# Patient Record
Sex: Male | Born: 1955 | Race: White | Hispanic: No | Marital: Married | Smoking: Former smoker
Health system: Southern US, Community
[De-identification: ages and names within clinical notes are randomized; demographics above are authoritative.]

## PROBLEM LIST (undated history)

## (undated) DIAGNOSIS — R911 Solitary pulmonary nodule: Secondary | ICD-10-CM

## (undated) DIAGNOSIS — K219 Gastro-esophageal reflux disease without esophagitis: Secondary | ICD-10-CM

## (undated) DIAGNOSIS — E119 Type 2 diabetes mellitus without complications: Secondary | ICD-10-CM

## (undated) DIAGNOSIS — F411 Generalized anxiety disorder: Secondary | ICD-10-CM

## (undated) DIAGNOSIS — F329 Major depressive disorder, single episode, unspecified: Secondary | ICD-10-CM

## (undated) DIAGNOSIS — F17200 Nicotine dependence, unspecified, uncomplicated: Secondary | ICD-10-CM

## (undated) DIAGNOSIS — I714 Abdominal aortic aneurysm, without rupture, unspecified: Secondary | ICD-10-CM

## (undated) DIAGNOSIS — M545 Low back pain, unspecified: Secondary | ICD-10-CM

## (undated) DIAGNOSIS — H6120 Impacted cerumen, unspecified ear: Secondary | ICD-10-CM

## (undated) DIAGNOSIS — E785 Hyperlipidemia, unspecified: Secondary | ICD-10-CM

## (undated) DIAGNOSIS — Z8639 Personal history of other endocrine, nutritional and metabolic disease: Secondary | ICD-10-CM

## (undated) DIAGNOSIS — J019 Acute sinusitis, unspecified: Secondary | ICD-10-CM

## (undated) DIAGNOSIS — R05 Cough: Secondary | ICD-10-CM

## (undated) DIAGNOSIS — I459 Conduction disorder, unspecified: Secondary | ICD-10-CM

## (undated) DIAGNOSIS — R059 Cough, unspecified: Secondary | ICD-10-CM

## (undated) DIAGNOSIS — R252 Cramp and spasm: Secondary | ICD-10-CM

## (undated) DIAGNOSIS — N529 Male erectile dysfunction, unspecified: Secondary | ICD-10-CM

## (undated) DIAGNOSIS — J449 Chronic obstructive pulmonary disease, unspecified: Secondary | ICD-10-CM

## (undated) DIAGNOSIS — I10 Essential (primary) hypertension: Secondary | ICD-10-CM

## (undated) DIAGNOSIS — I499 Cardiac arrhythmia, unspecified: Secondary | ICD-10-CM

## (undated) DIAGNOSIS — N179 Acute kidney failure, unspecified: Secondary | ICD-10-CM

## (undated) DIAGNOSIS — F32A Depression, unspecified: Secondary | ICD-10-CM

## (undated) DIAGNOSIS — Z0000 Encounter for general adult medical examination without abnormal findings: Secondary | ICD-10-CM

## (undated) DIAGNOSIS — L0291 Cutaneous abscess, unspecified: Secondary | ICD-10-CM

## (undated) DIAGNOSIS — T7840XA Allergy, unspecified, initial encounter: Secondary | ICD-10-CM

## (undated) DIAGNOSIS — L84 Corns and callosities: Secondary | ICD-10-CM

## (undated) DIAGNOSIS — C8590 Non-Hodgkin lymphoma, unspecified, unspecified site: Secondary | ICD-10-CM

## (undated) HISTORY — DX: Conduction disorder, unspecified: I45.9

## (undated) HISTORY — DX: Impacted cerumen, unspecified ear: H61.20

## (undated) HISTORY — DX: Cramp and spasm: R25.2

## (undated) HISTORY — DX: Allergy, unspecified, initial encounter: T78.40XA

## (undated) HISTORY — DX: Type 2 diabetes mellitus without complications: E11.9

## (undated) HISTORY — PX: POLYPECTOMY: SHX149

## (undated) HISTORY — DX: Hyperlipidemia, unspecified: E78.5

## (undated) HISTORY — DX: Generalized anxiety disorder: F41.1

## (undated) HISTORY — DX: Gastro-esophageal reflux disease without esophagitis: K21.9

## (undated) HISTORY — DX: Major depressive disorder, single episode, unspecified: F32.9

## (undated) HISTORY — PX: COLONOSCOPY: SHX174

## (undated) HISTORY — DX: Cough: R05

## (undated) HISTORY — DX: Low back pain: M54.5

## (undated) HISTORY — DX: Depression, unspecified: F32.A

## (undated) HISTORY — DX: Nicotine dependence, unspecified, uncomplicated: F17.200

## (undated) HISTORY — DX: Cough, unspecified: R05.9

## (undated) HISTORY — DX: Essential (primary) hypertension: I10

## (undated) HISTORY — DX: Low back pain, unspecified: M54.50

## (undated) HISTORY — DX: Acute sinusitis, unspecified: J01.90

## (undated) HISTORY — DX: Male erectile dysfunction, unspecified: N52.9

## (undated) HISTORY — DX: Abdominal aortic aneurysm, without rupture: I71.4

## (undated) HISTORY — PX: INGUINAL HERNIA REPAIR: SUR1180

## (undated) HISTORY — PX: TONSILLECTOMY: SUR1361

## (undated) HISTORY — PX: BACK SURGERY: SHX140

## (undated) HISTORY — DX: Encounter for general adult medical examination without abnormal findings: Z00.00

## (undated) HISTORY — DX: Corns and callosities: L84

## (undated) HISTORY — DX: Non-Hodgkin lymphoma, unspecified, unspecified site: C85.90

## (undated) HISTORY — DX: Abdominal aortic aneurysm, without rupture, unspecified: I71.40

---
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
## 2002-03-31 ENCOUNTER — Encounter: Admission: RE | Admit: 2002-03-31 | Discharge: 2002-06-29 | Payer: Self-pay | Admitting: Family Medicine

## 2004-04-19 ENCOUNTER — Ambulatory Visit: Payer: Self-pay | Admitting: Family Medicine

## 2004-08-10 ENCOUNTER — Ambulatory Visit: Payer: Self-pay | Admitting: Family Medicine

## 2004-12-19 ENCOUNTER — Ambulatory Visit: Payer: Self-pay | Admitting: Family Medicine

## 2005-04-03 ENCOUNTER — Ambulatory Visit: Payer: Self-pay | Admitting: Family Medicine

## 2005-07-04 ENCOUNTER — Ambulatory Visit: Payer: Self-pay | Admitting: Family Medicine

## 2005-08-01 ENCOUNTER — Ambulatory Visit: Payer: Self-pay | Admitting: Family Medicine

## 2006-07-09 ENCOUNTER — Ambulatory Visit: Payer: Self-pay | Admitting: Internal Medicine

## 2006-07-09 LAB — CONVERTED CEMR LAB
ALT: 22 units/L (ref 0–40)
Alkaline Phosphatase: 57 units/L (ref 39–117)
BUN: 15 mg/dL (ref 6–23)
Basophils Relative: 0.8 % (ref 0.0–1.0)
Bilirubin Urine: NEGATIVE
Calcium: 9.3 mg/dL (ref 8.4–10.5)
Chloride: 109 meq/L (ref 96–112)
Eosinophils Absolute: 0.2 10*3/uL (ref 0.0–0.6)
GFR calc Af Amer: 152 mL/min
GFR calc non Af Amer: 126 mL/min
HDL: 43.9 mg/dL (ref >39.0)
Ketones, ur: NEGATIVE mg/dL
Lymphocytes Relative: 38.5 % (ref 12.0–46.0)
MCV: 90.1 fL (ref 78.0–100.0)
Monocytes Relative: 7.7 % (ref 3.0–11.0)
Neutro Abs: 3.2 10*3/uL (ref 1.4–7.7)
Nitrite: NEGATIVE
Platelets: 189 10*3/uL (ref 150–400)
RBC: 4.98 M/uL (ref 4.22–5.81)
Specific Gravity, Urine: >=1.03 (ref 1.000–1.03)
TSH: 1.92 microintl units/mL (ref 0.35–5.50)
Total CHOL/HDL Ratio: 4.6
Total Protein, Urine: NEGATIVE mg/dL
Triglycerides: 184 mg/dL — ABNORMAL HIGH (ref 0–149)
Urine Glucose: NEGATIVE mg/dL

## 2006-07-12 ENCOUNTER — Ambulatory Visit: Payer: Self-pay | Admitting: Internal Medicine

## 2006-11-07 ENCOUNTER — Ambulatory Visit: Payer: Self-pay | Admitting: Internal Medicine

## 2006-11-07 LAB — CONVERTED CEMR LAB
BUN: 23 mg/dL (ref 6–23)
Calcium: 9.3 mg/dL (ref 8.4–10.5)
Chloride: 106 meq/L (ref 96–112)
Creatinine, Ser: 0.8 mg/dL (ref 0.4–1.5)
Hgb A1c MFr Bld: 6.4 % — ABNORMAL HIGH (ref 4.6–6.0)
Potassium: 5.1 meq/L (ref 3.5–5.1)

## 2006-11-13 ENCOUNTER — Ambulatory Visit: Payer: Self-pay | Admitting: Internal Medicine

## 2007-01-11 ENCOUNTER — Encounter: Payer: Self-pay | Admitting: Internal Medicine

## 2007-01-11 DIAGNOSIS — F419 Anxiety disorder, unspecified: Secondary | ICD-10-CM | POA: Insufficient documentation

## 2007-01-11 DIAGNOSIS — E119 Type 2 diabetes mellitus without complications: Secondary | ICD-10-CM | POA: Insufficient documentation

## 2007-01-11 DIAGNOSIS — F411 Generalized anxiety disorder: Secondary | ICD-10-CM | POA: Insufficient documentation

## 2007-01-11 DIAGNOSIS — I10 Essential (primary) hypertension: Secondary | ICD-10-CM | POA: Insufficient documentation

## 2007-03-11 ENCOUNTER — Ambulatory Visit: Payer: Self-pay | Admitting: Internal Medicine

## 2007-03-11 LAB — CONVERTED CEMR LAB
AST: 18 units/L (ref 0–37)
Bilirubin, Direct: 0.1 mg/dL (ref 0.0–0.3)
Chloride: 108 meq/L (ref 96–112)
Cholesterol: 193 mg/dL (ref 0–200)
GFR calc non Af Amer: 108 mL/min
Glucose, Bld: 142 mg/dL — ABNORMAL HIGH (ref 70–99)
HDL: 33.1 mg/dL — ABNORMAL LOW (ref >39.0)
Hgb A1c MFr Bld: 6.5 % — ABNORMAL HIGH (ref 4.6–6.0)
Sodium: 142 meq/L (ref 135–145)
Total CHOL/HDL Ratio: 5.8
Triglycerides: 215 mg/dL (ref 0–149)

## 2007-03-14 ENCOUNTER — Ambulatory Visit: Payer: Self-pay | Admitting: Internal Medicine

## 2007-03-14 DIAGNOSIS — F329 Major depressive disorder, single episode, unspecified: Secondary | ICD-10-CM | POA: Insufficient documentation

## 2007-03-14 DIAGNOSIS — E785 Hyperlipidemia, unspecified: Secondary | ICD-10-CM | POA: Insufficient documentation

## 2007-03-14 DIAGNOSIS — J309 Allergic rhinitis, unspecified: Secondary | ICD-10-CM | POA: Insufficient documentation

## 2007-03-14 DIAGNOSIS — F32A Depression, unspecified: Secondary | ICD-10-CM | POA: Insufficient documentation

## 2007-06-27 ENCOUNTER — Ambulatory Visit: Payer: Self-pay | Admitting: Gastroenterology

## 2007-07-11 ENCOUNTER — Ambulatory Visit: Payer: Self-pay | Admitting: Gastroenterology

## 2007-07-11 ENCOUNTER — Encounter: Payer: Self-pay | Admitting: Gastroenterology

## 2008-04-07 ENCOUNTER — Ambulatory Visit: Payer: Self-pay | Admitting: Internal Medicine

## 2008-04-08 LAB — CONVERTED CEMR LAB
ALT: 37 units/L (ref 0–53)
AST: 26 units/L (ref 0–37)
Alkaline Phosphatase: 59 units/L (ref 39–117)
Bilirubin, Direct: 0.1 mg/dL (ref 0.0–0.3)
CO2: 27 meq/L (ref 19–32)
Chloride: 104 meq/L (ref 96–112)
Creatinine, Ser: 0.7 mg/dL (ref 0.4–1.5)
Sodium: 136 meq/L (ref 135–145)
Total Bilirubin: 0.8 mg/dL (ref 0.3–1.2)
Total CHOL/HDL Ratio: 4.6
Triglycerides: 81 mg/dL (ref 0–149)

## 2008-04-10 ENCOUNTER — Ambulatory Visit: Payer: Self-pay | Admitting: Internal Medicine

## 2008-04-10 DIAGNOSIS — H6120 Impacted cerumen, unspecified ear: Secondary | ICD-10-CM | POA: Insufficient documentation

## 2008-04-10 DIAGNOSIS — L84 Corns and callosities: Secondary | ICD-10-CM | POA: Insufficient documentation

## 2008-04-10 DIAGNOSIS — F17200 Nicotine dependence, unspecified, uncomplicated: Secondary | ICD-10-CM | POA: Insufficient documentation

## 2008-06-18 ENCOUNTER — Ambulatory Visit: Payer: Self-pay | Admitting: Internal Medicine

## 2008-06-18 DIAGNOSIS — J019 Acute sinusitis, unspecified: Secondary | ICD-10-CM | POA: Insufficient documentation

## 2008-06-18 DIAGNOSIS — R252 Cramp and spasm: Secondary | ICD-10-CM | POA: Insufficient documentation

## 2008-08-11 ENCOUNTER — Ambulatory Visit: Payer: Self-pay | Admitting: Internal Medicine

## 2008-08-11 LAB — CONVERTED CEMR LAB
Albumin: 4 g/dL (ref 3.5–5.2)
BUN: 19 mg/dL (ref 6–23)
CO2: 31 meq/L (ref 19–32)
Calcium: 9.8 mg/dL (ref 8.4–10.5)
Creatinine, Ser: 0.9 mg/dL (ref 0.4–1.5)
Glucose, Bld: 181 mg/dL — ABNORMAL HIGH (ref 70–99)
HDL: 35.2 mg/dL — ABNORMAL LOW (ref >39.00)
Total Protein: 7.2 g/dL (ref 6.0–8.3)
Triglycerides: 115 mg/dL (ref 0.0–149.0)

## 2008-12-14 ENCOUNTER — Ambulatory Visit: Payer: Self-pay | Admitting: Internal Medicine

## 2008-12-14 DIAGNOSIS — R059 Cough, unspecified: Secondary | ICD-10-CM | POA: Insufficient documentation

## 2008-12-14 DIAGNOSIS — R05 Cough: Secondary | ICD-10-CM

## 2008-12-16 LAB — CONVERTED CEMR LAB
ALT: 37 units/L (ref 0–53)
AST: 28 units/L (ref 0–37)
BUN: 18 mg/dL (ref 6–23)
Bilirubin, Direct: 0.1 mg/dL (ref 0.0–0.3)
CO2: 31 meq/L (ref 19–32)
Chloride: 104 meq/L (ref 96–112)
Creatinine, Ser: 0.9 mg/dL (ref 0.4–1.5)
Total Protein: 7.3 g/dL (ref 6.0–8.3)

## 2009-01-07 ENCOUNTER — Ambulatory Visit: Payer: Self-pay | Admitting: Pulmonary Disease

## 2009-01-07 DIAGNOSIS — R911 Solitary pulmonary nodule: Secondary | ICD-10-CM | POA: Insufficient documentation

## 2009-01-07 DIAGNOSIS — R930 Abnormal findings on diagnostic imaging of skull and head, not elsewhere classified: Secondary | ICD-10-CM | POA: Insufficient documentation

## 2009-04-15 ENCOUNTER — Ambulatory Visit: Payer: Self-pay | Admitting: Internal Medicine

## 2009-04-15 LAB — CONVERTED CEMR LAB
ALT: 38 units/L (ref 0–53)
AST: 29 units/L (ref 0–37)
Albumin: 4 g/dL (ref 3.5–5.2)
BUN: 19 mg/dL (ref 6–23)
Basophils Relative: 0.3 % (ref 0.0–3.0)
Chloride: 99 meq/L (ref 96–112)
Cholesterol: 131 mg/dL (ref 0–200)
Eosinophils Relative: 4 % (ref 0.0–5.0)
HCT: 44.9 % (ref 39.0–52.0)
Hemoglobin, Urine: NEGATIVE
Hemoglobin: 15.4 g/dL (ref 13.0–17.0)
Hgb A1c MFr Bld: 7.6 % — ABNORMAL HIGH (ref 4.6–6.5)
Ketones, ur: NEGATIVE mg/dL
LDL Cholesterol: 64 mg/dL (ref 0–99)
Lymphs Abs: 2.2 10*3/uL (ref 0.7–4.0)
MCV: 94.2 fL (ref 78.0–100.0)
Monocytes Absolute: 0.9 10*3/uL (ref 0.1–1.0)
Monocytes Relative: 11.3 % (ref 3.0–12.0)
Neutro Abs: 4.7 10*3/uL (ref 1.4–7.7)
Platelets: 194 10*3/uL (ref 150.0–400.0)
Potassium: 3.4 meq/L — ABNORMAL LOW (ref 3.5–5.1)
RBC: 4.76 M/uL (ref 4.22–5.81)
Sodium: 138 meq/L (ref 135–145)
TSH: 1.85 microintl units/mL (ref 0.35–5.50)
Total Protein, Urine: NEGATIVE mg/dL
Total Protein: 7.2 g/dL (ref 6.0–8.3)
Urine Glucose: NEGATIVE mg/dL
Urobilinogen, UA: 0.2 (ref 0.0–1.0)
WBC: 8.1 10*3/uL (ref 4.5–10.5)

## 2009-04-19 ENCOUNTER — Ambulatory Visit: Payer: Self-pay | Admitting: Internal Medicine

## 2009-08-17 ENCOUNTER — Ambulatory Visit: Payer: Self-pay | Admitting: Internal Medicine

## 2009-08-17 LAB — CONVERTED CEMR LAB
AST: 34 units/L (ref 0–37)
Albumin: 4.3 g/dL (ref 3.5–5.2)
Alkaline Phosphatase: 58 units/L (ref 39–117)
BUN: 25 mg/dL — ABNORMAL HIGH (ref 6–23)
CO2: 30 meq/L (ref 19–32)
Calcium: 9.4 mg/dL (ref 8.4–10.5)
GFR calc non Af Amer: 106.52 mL/min (ref >60)
Glucose, Bld: 201 mg/dL — ABNORMAL HIGH (ref 70–99)
Hgb A1c MFr Bld: 7.9 % — ABNORMAL HIGH (ref 4.6–6.5)
Sodium: 141 meq/L (ref 135–145)
Total Protein: 8 g/dL (ref 6.0–8.3)

## 2009-08-19 ENCOUNTER — Ambulatory Visit: Payer: Self-pay | Admitting: Internal Medicine

## 2009-12-21 ENCOUNTER — Ambulatory Visit: Payer: Self-pay | Admitting: Internal Medicine

## 2009-12-21 LAB — CONVERTED CEMR LAB
CO2: 28 meq/L (ref 19–32)
Calcium: 9.1 mg/dL (ref 8.4–10.5)
Glucose, Bld: 173 mg/dL — ABNORMAL HIGH (ref 70–99)
Potassium: 4.2 meq/L (ref 3.5–5.1)

## 2009-12-23 ENCOUNTER — Ambulatory Visit: Payer: Self-pay | Admitting: Internal Medicine

## 2010-03-23 ENCOUNTER — Ambulatory Visit: Payer: Self-pay | Admitting: Internal Medicine

## 2010-03-23 LAB — CONVERTED CEMR LAB
BUN: 19 mg/dL (ref 6–23)
CO2: 28 meq/L (ref 19–32)
Chloride: 103 meq/L (ref 96–112)
Glucose, Bld: 137 mg/dL — ABNORMAL HIGH (ref 70–99)
Potassium: 5.2 meq/L — ABNORMAL HIGH (ref 3.5–5.1)

## 2010-03-25 ENCOUNTER — Ambulatory Visit: Payer: Self-pay | Admitting: Internal Medicine

## 2010-06-07 NOTE — Assessment & Plan Note (Signed)
Summary: 4 mos f/u // # . cd   Vital Signs:  Patient profile:   55 year old male Height:      72 inches Weight:      196 pounds BMI:     26.68 Temp:     98.2 degrees F oral Pulse rate:   60 / minute Pulse rhythm:   regular Resp:     16 per minute BP sitting:   122 / 80  (left arm) Cuff size:   regular  Vitals Entered By: Lanier Prude, Beverly Gust) (December 23, 2009 7:49 AM) CC: 4 mo f/u Is Patient Diabetic? Yes Comments pt states he could not afford Janumet so he has been taking his metformin instead.   Primary Care Provider:  Tresa Garter MD  CC:  4 mo f/u.  History of Present Illness: The patient presents for a follow up of hypertension, diabetes, hyperlipidemia. He never took Janumet - too expensive...   Current Medications (verified): 1)  Paroxetine Hcl 40 Mg  Tabs (Paroxetine Hcl) .... Once Daily 2)  Fish Oil   Oil (Fish Oil) .Marland Kitchen.. 1 Bid 3)  Vitamin D3 1000 Unit  Tabs (Cholecalciferol) .Marland Kitchen.. 1 Qd 4)  Lovastatin 20 Mg Tabs (Lovastatin) .... Take 1 Tab By Mouth Daily 5)  Accupril 20 Mg  Tabs (Quinapril Hcl) .... Take 1 Tab By Mouth Every Day 6)  Atenolol-Chlorthalidone 100-25 Mg Tabs (Atenolol-Chlorthalidone) .Marland Kitchen.. 1 By Mouth Qd 7)  Janumet 50-1000 Mg Tabs (Sitagliptin-Metformin Hcl) .Marland Kitchen.. 1 By Mouth Two Times A Day For Diabetes 8)  Adult Aspirin Ec Low Strength 81 Mg  Tbec (Aspirin) .... Once Daily  Allergies (verified): 1)  ! * Pcn 2)  Pravachol 3)  Allegra 4)  Hydrochlorothiazide  Past History:  Past Medical History: Last updated: 01/07/2009 COUGH (ICD-786.2) CRAMPS,LEG (ICD-729.82) SINUSITIS, ACUTE (ICD-461.9) TOBACCO USE DISORDER/SMOKER-SMOKING CESSATION DISCUSSED (ICD-305.1) CALLUS, TOE (ICD-700) CERUMEN IMPACTION (ICD-380.4) WELL ADULT EXAM (ICD-V70.0) HYPERLIPIDEMIA (ICD-272.4) DEPRESSION (ICD-311) ALLERGIC RHINITIS (ICD-477.9) ANXIETY (ICD-300.00) HYPERTENSION (ICD-401.9) DIABETES MELLITUS, TYPE II (ICD-250.00)     Past Surgical  History: Last updated: 04/10/2008 Denies surgical history  Social History: Last updated: 01/07/2009 Married with children. Current Smoker.  started at age 62.  1 ppd. 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  Review of Systems  The patient denies fever, dyspnea on exertion, abdominal pain, and depression.    Physical Exam  General:  NAD,overweight-appearing.   Head:  Normocephalic and atraumatic without obvious abnormalities. No apparent alopecia or balding. Nose:  External nasal examination shows no deformity or inflammation. Nasal mucosa are pink and moist without lesions or exudates. Mouth:  Oral mucosa and oropharynx without lesions or exudates.  Teeth in good repair. Abdomen:  Bowel sounds positive,abdomen soft and non-tender without masses, organomegaly or hernias noted. Msk:  No deformity or scoliosis noted of thoracic or lumbar spine.   Extremities:  No clubbing, cyanosis, edema, or deformity noted with normal full range of motion of all joints.   Neurologic:  No cranial nerve deficits noted. Station and gait are normal. Plantar reflexes are down-going bilaterally. DTRs are symmetrical throughout. Sensory, motor and coordinative functions appear intact. Skin:  small left foot corn Psych:  Cognition and judgment appear intact. Alert and cooperative with normal attention span and concentration. No apparent delusions, illusions, hallucinations   Impression & Recommendations:  Problem # 1:  DIABETES MELLITUS, TYPE II (ICD-250.00) Assessment Deteriorated  The following medications were removed from the medication list:    Janumet 50-1000 Mg Tabs (Sitagliptin-metformin  hcl) ..... 1 by mouth two times a day for diabetes His updated medication list for this problem includes:    Accupril 20 Mg Tabs (Quinapril hcl) .Marland Kitchen... Take 1 tab by mouth every day    Adult Aspirin Ec Low Strength 81 Mg Tbec (Aspirin) ..... Once daily    Metformin Hcl 1000 Mg Tabs (Metformin hcl) .Marland Kitchen... 1 by  mouth two times a day for diabetes    Glimepiride 1 Mg Tabs (Glimepiride) .Marland Kitchen... 1 by mouth two times a day Start with 1 a day Risks of noncompliance with treatment discussed. Compliance encouraged.   Problem # 2:  DEPRESSION (ICD-311) Assessment: Unchanged  His updated medication list for this problem includes:    Paroxetine Hcl 40 Mg Tabs (Paroxetine hcl) ..... Once daily  Problem # 3:  HYPERLIPIDEMIA (ICD-272.4) Assessment: Unchanged  His updated medication list for this problem includes:    Lovastatin 20 Mg Tabs (Lovastatin) .Marland Kitchen... Take 1 tab by mouth daily  Problem # 4:  ANXIETY (ICD-300.00) Assessment: Unchanged  His updated medication list for this problem includes:    Paroxetine Hcl 40 Mg Tabs (Paroxetine hcl) ..... Once daily  Complete Medication List: 1)  Paroxetine Hcl 40 Mg Tabs (Paroxetine hcl) .... Once daily 2)  Fish Oil Oil (Fish oil) .Marland Kitchen.. 1 bid 3)  Vitamin D3 1000 Unit Tabs (Cholecalciferol) .Marland Kitchen.. 1 qd 4)  Lovastatin 20 Mg Tabs (Lovastatin) .... Take 1 tab by mouth daily 5)  Accupril 20 Mg Tabs (Quinapril hcl) .... Take 1 tab by mouth every day 6)  Atenolol-chlorthalidone 100-25 Mg Tabs (Atenolol-chlorthalidone) .Marland Kitchen.. 1 by mouth qd 7)  Adult Aspirin Ec Low Strength 81 Mg Tbec (Aspirin) .... Once daily 8)  Metformin Hcl 1000 Mg Tabs (Metformin hcl) .Marland Kitchen.. 1 by mouth two times a day for diabetes 9)  Glimepiride 1 Mg Tabs (Glimepiride) .Marland Kitchen.. 1 by mouth bid  Patient Instructions: 1)  Please schedule a follow-up appointment in 3 mo . 2)  BMP prior to visit, ICD-9:250.02 3)  HbgA1C prior to visit, ICD-9: Prescriptions: GLIMEPIRIDE 1 MG TABS (GLIMEPIRIDE) 1 by mouth bid  #60 x 12   Entered and Authorized by:   Tresa Garter MD   Signed by:   Tresa Garter MD on 12/23/2009   Method used:   Print then Give to Patient   RxID:   [id number] METFORMIN HCL 1000 MG TABS (METFORMIN HCL) 1 by mouth two times a day for diabetes  #60 x 12   Entered and Authorized  by:   Tresa Garter MD   Signed by:   Tresa Garter MD on 12/23/2009   Method used:   Print then Give to Patient   RxID:   [id number]

## 2010-06-07 NOTE — Assessment & Plan Note (Signed)
Summary: 3 MONTH FOLLOW UP-LB   Vital Signs:  Patient profile:   55 year old male Height:      72 inches Weight:      204 pounds BMI:     27.77 Temp:     98.1 degrees F oral Pulse rate:   84 / minute Pulse rhythm:   regular Resp:     16 per minute BP sitting:   100 / 68  (left arm) Cuff size:   regular  Vitals Entered By: Lanier Prude, CMA(AAMA) (March 25, 2010 8:17 AM) CC: 3 mo f/u Is Patient Diabetic? Yes   Primary Care Provider:  Tresa Garter MD  CC:  3 mo f/u.  History of Present Illness: The patient presents for a follow up of hypertension, diabetes, hyperlipidemia   Current Medications (verified): 1)  Paroxetine Hcl 40 Mg  Tabs (Paroxetine Hcl) .... Once Daily 2)  Fish Oil   Oil (Fish Oil) .Marland Kitchen.. 1 Bid 3)  Vitamin D3 1000 Unit  Tabs (Cholecalciferol) .Marland Kitchen.. 1 Qd 4)  Lovastatin 20 Mg Tabs (Lovastatin) .... Take 1 Tab By Mouth Daily 5)  Accupril 20 Mg  Tabs (Quinapril Hcl) .... Take 1 Tab By Mouth Every Day 6)  Atenolol-Chlorthalidone 100-25 Mg Tabs (Atenolol-Chlorthalidone) .Marland Kitchen.. 1 By Mouth Qd 7)  Adult Aspirin Ec Low Strength 81 Mg  Tbec (Aspirin) .... Once Daily 8)  Metformin Hcl 1000 Mg Tabs (Metformin Hcl) .Marland Kitchen.. 1 By Mouth Two Times A Day For Diabetes 9)  Glimepiride 1 Mg Tabs (Glimepiride) .Marland Kitchen.. 1 By Mouth Bid  Allergies (verified): 1)  ! * Pcn 2)  Pravachol 3)  Allegra 4)  Hydrochlorothiazide  Past History:  Past Medical History: Last updated: 01/07/2009 COUGH (ICD-786.2) CRAMPS,LEG (ICD-729.82) SINUSITIS, ACUTE (ICD-461.9) TOBACCO USE DISORDER/SMOKER-SMOKING CESSATION DISCUSSED (ICD-305.1) CALLUS, TOE (ICD-700) CERUMEN IMPACTION (ICD-380.4) WELL ADULT EXAM (ICD-V70.0) HYPERLIPIDEMIA (ICD-272.4) DEPRESSION (ICD-311) ALLERGIC RHINITIS (ICD-477.9) ANXIETY (ICD-300.00) HYPERTENSION (ICD-401.9) DIABETES MELLITUS, TYPE II (ICD-250.00)     Social History: Last updated: 01/07/2009 Married with children. Current Smoker.  started at age 9.   1 ppd. 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  Review of Systems       The patient complains of weight gain.  The patient denies dyspnea on exertion, prolonged cough, abdominal pain, and melena.    Physical Exam  General:  NAD,overweight-appearing.   Nose:  External nasal examination shows no deformity or inflammation. Nasal mucosa are pink and moist without lesions or exudates. Mouth:  Oral mucosa and oropharynx without lesions or exudates.  Teeth in good repair. Neck:  No deformities, masses, or tenderness noted. Lungs:  Normal respiratory effort, chest expands symmetrically. Lungs are clear to auscultation, no crackles or wheezes. Heart:  Normal rate and regular rhythm. S1 and S2 normal without gallop, murmur, click, rub or other extra sounds. Abdomen:  Bowel sounds positive,abdomen soft and non-tender without masses, organomegaly or hernias noted. Msk:  No deformity or scoliosis noted of thoracic or lumbar spine.   Neurologic:  No cranial nerve deficits noted. Station and gait are normal. Plantar reflexes are down-going bilaterally. DTRs are symmetrical throughout. Sensory, motor and coordinative functions appear intact. Skin:  small left foot corn Psych:  Cognition and judgment appear intact. Alert and cooperative with normal attention span and concentration. No apparent delusions, illusions, hallucinations   Impression & Recommendations:  Problem # 1:  HYPERLIPIDEMIA (ICD-272.4) Assessment Unchanged  His updated medication list for this problem includes:    Lovastatin 20 Mg Tabs (Lovastatin) .Marland Kitchen... Take  1 tab by mouth daily  Problem # 2:  TOBACCO USE DISORDER/SMOKER-SMOKING CESSATION DISCUSSED (ICD-305.1) Assessment: Unchanged  Encouraged smoking cessation and discussed different methods for smoking cessation.   Problem # 3:  DEPRESSION (ICD-311) Assessment: Unchanged  His updated medication list for this problem includes:    Paroxetine Hcl 40 Mg Tabs (Paroxetine hcl)  ..... Once daily  Problem # 4:  DIABETES MELLITUS, TYPE II (ICD-250.00) Assessment: Improved  His updated medication list for this problem includes:    Accupril 20 Mg Tabs (Quinapril hcl) .Marland Kitchen... Take 1 tab by mouth every day    Adult Aspirin Ec Low Strength 81 Mg Tbec (Aspirin) ..... Once daily    Metformin Hcl 1000 Mg Tabs (Metformin hcl) .Marland Kitchen... 1 by mouth two times a day for diabetes    Glimepiride 1 Mg Tabs (Glimepiride) .Marland Kitchen... 1 by mouth bid  Labs Reviewed: Creat: 0.9 (03/23/2010)    Reviewed HgBA1c results: 7.0 (03/23/2010)  8.1 (12/21/2009)  Complete Medication List: 1)  Paroxetine Hcl 40 Mg Tabs (Paroxetine hcl) .... Once daily 2)  Fish Oil Oil (Fish oil) .Marland Kitchen.. 1 bid 3)  Vitamin D3 1000 Unit Tabs (Cholecalciferol) .Marland Kitchen.. 1 qd 4)  Lovastatin 20 Mg Tabs (Lovastatin) .... Take 1 tab by mouth daily 5)  Accupril 20 Mg Tabs (Quinapril hcl) .... Take 1 tab by mouth every day 6)  Atenolol-chlorthalidone 100-25 Mg Tabs (Atenolol-chlorthalidone) .Marland Kitchen.. 1 by mouth qd 7)  Adult Aspirin Ec Low Strength 81 Mg Tbec (Aspirin) .... Once daily 8)  Metformin Hcl 1000 Mg Tabs (Metformin hcl) .Marland Kitchen.. 1 by mouth two times a day for diabetes 9)  Glimepiride 1 Mg Tabs (Glimepiride) .Marland Kitchen.. 1 by mouth bid  Other Orders: Admin 1st Vaccine (16109) Flu Vaccine 60yrs + (60454) Tdap => 1yrs IM (09811) Admin of Any Addtl Vaccine (91478)  Patient Instructions: 1)  Please schedule a follow-up appointment in 4 months well w/labs. 2)  HbgA1C prior to visit, ICD-9:250.00   Orders Added: 1)  Est. Patient Level IV [29562] 2)  Admin 1st Vaccine [90471] 3)  Flu Vaccine 24yrs + [13086] 4)  Tdap => 27yrs IM [90715] 5)  Admin of Any Addtl Vaccine [57846]   Immunizations Administered:  Tetanus Vaccine:    Vaccine Type: Tdap    Site: left deltoid    Mfr: Sanofi Pasteur    Dose: 0.5 ml    Route: IM    Given by: Lanier Prude, CMA(AAMA)    Exp. Date: 07/29/2011    Lot #: [id number]    VIS given: 03/25/08 version given  March 25, 2010.   Immunizations Administered:  Tetanus Vaccine:    Vaccine Type: Tdap    Site: left deltoid    Mfr: Sanofi Pasteur    Dose: 0.5 ml    Route: IM    Given by: Lanier Prude, CMA(AAMA)    Exp. Date: 07/29/2011    Lot #: [id number]    VIS given: 03/25/08 version given March 25, 2010.  Influenza Vaccine (to be given today)      Flu Vaccine Consent Questions     Do you have a history of severe allergic reactions to this vaccine? no    Any prior history of allergic reactions to egg and/or gelatin? no    Do you have a sensitivity to the preservative Thimersol? no    Do you have a past history of Guillan-Barre Syndrome? no    Do you currently have an acute febrile illness? no    Have you ever had  a severe reaction to latex? no    Vaccine information given and explained to patient? yes    Are you currently pregnant? no    Lot Number:[id number]   Exp Date:11/05/2010   Site Given  Right Deltoid IM Lanier Prude, [facility])  March 25, 2010 9:40 AM .Craige Cotta

## 2010-06-07 NOTE — Assessment & Plan Note (Signed)
Summary: 4 MONTH FOLLOW UP-LB   Vital Signs:  Patient profile:   55 year old male Height:      72 inches Weight:      199 pounds BMI:     27.09 O2 Sat:      98 % on Room air Temp:     97.4 degrees F oral Pulse rate:   69 / minute BP sitting:   136 / 90  (left arm) Cuff size:   regular  Vitals Entered By: Lucious Groves (August 19, 2009 9:23 AM)  O2 Flow:  Room air CC: 4 mo f/u--Pt states that he is doing fine and denies any symptoms of increased BP./kb Is Patient Diabetic? No Pain Assessment Patient in pain? no        Primary Care Provider:  Tresa Garter MD  CC:  4 mo f/u--Pt states that he is doing fine and denies any symptoms of increased BP./kb.  History of Present Illness: The patient presents for a follow up of hypertension, diabetes, hyperlipidemia   Current Medications (verified): 1)  Metformin Hcl 1000 Mg  Tabs (Metformin Hcl) .... Two Times A Day 2)  Paroxetine Hcl 40 Mg  Tabs (Paroxetine Hcl) .... Once Daily 3)  Adult Aspirin Ec Low Strength 81 Mg  Tbec (Aspirin) .... Once Daily 4)  Fish Oil   Oil (Fish Oil) .Marland Kitchen.. 1 Bid 5)  Vitamin D3 1000 Unit  Tabs (Cholecalciferol) .Marland Kitchen.. 1 Qd 6)  Lovastatin 20 Mg Tabs (Lovastatin) .... Take 1 Tab By Mouth Daily 7)  Accupril 20 Mg  Tabs (Quinapril Hcl) .... Take 1 Tab By Mouth Every Day 8)  Atenolol-Chlorthalidone 100-25 Mg Tabs (Atenolol-Chlorthalidone) .Marland Kitchen.. 1 By Mouth Qd  Allergies (verified): 1)  ! * Pcn 2)  Pravachol 3)  Allegra 4)  Hydrochlorothiazide  Past History:  Past Medical History: Last updated: 01/07/2009 COUGH (ICD-786.2) CRAMPS,LEG (ICD-729.82) SINUSITIS, ACUTE (ICD-461.9) TOBACCO USE DISORDER/SMOKER-SMOKING CESSATION DISCUSSED (ICD-305.1) CALLUS, TOE (ICD-700) CERUMEN IMPACTION (ICD-380.4) WELL ADULT EXAM (ICD-V70.0) HYPERLIPIDEMIA (ICD-272.4) DEPRESSION (ICD-311) ALLERGIC RHINITIS (ICD-477.9) ANXIETY (ICD-300.00) HYPERTENSION (ICD-401.9) DIABETES MELLITUS, TYPE II (ICD-250.00)      Social History: Last updated: 01/07/2009 Married with children. Current Smoker.  started at age 89.  1 ppd. 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  Review of Systems  The patient denies fever, syncope, and abdominal pain.    Physical Exam  General:  NAD,overweight-appearing.   Nose:  External nasal examination shows no deformity or inflammation. Nasal mucosa are pink and moist without lesions or exudates. Mouth:  Oral mucosa and oropharynx without lesions or exudates.  Teeth in good repair. Lungs:  Normal respiratory effort, chest expands symmetrically. Lungs are clear to auscultation, no crackles or wheezes. Heart:  Normal rate and regular rhythm. S1 and S2 normal without gallop, murmur, click, rub or other extra sounds. Abdomen:  Bowel sounds positive,abdomen soft and non-tender without masses, organomegaly or hernias noted. Msk:  No deformity or scoliosis noted of thoracic or lumbar spine.   Neurologic:  No cranial nerve deficits noted. Station and gait are normal. Plantar reflexes are down-going bilaterally. DTRs are symmetrical throughout. Sensory, motor and coordinative functions appear intact. Skin:  Intact without suspicious lesions or rashes Psych:  Cognition and judgment appear intact. Alert and cooperative with normal attention span and concentration. No apparent delusions, illusions, hallucinations   Impression & Recommendations:  Problem # 1:  DIABETES MELLITUS, TYPE II (ICD-250.00) Assessment Deteriorated  The following medications were removed from the medication list:  Metformin Hcl 1000 Mg Tabs (Metformin hcl) .Marland Kitchen..Marland Kitchen Two times a day His updated medication list for this problem includes:    Accupril 20 Mg Tabs (Quinapril hcl) .Marland Kitchen... Take 1 tab by mouth every day    Janumet 50-1000 Mg Tabs (Sitagliptin-metformin hcl) .Marland Kitchen... 1 by mouth two times a day for diabetes    Adult Aspirin Ec Low Strength 81 Mg Tbec (Aspirin) ..... Once daily  Problem # 2:  DEPRESSION  (ICD-311) Assessment: Improved  His updated medication list for this problem includes:    Paroxetine Hcl 40 Mg Tabs (Paroxetine hcl) ..... Once daily  Problem # 3:  HYPERTENSION (ICD-401.9) Assessment: Improved  His updated medication list for this problem includes:    Accupril 20 Mg Tabs (Quinapril hcl) .Marland Kitchen... Take 1 tab by mouth every day    Atenolol-chlorthalidone 100-25 Mg Tabs (Atenolol-chlorthalidone) .Marland Kitchen... 1 by mouth qd  BP today: 136/90 Prior BP: 146/100 (04/19/2009)  Labs Reviewed: K+: 3.9 (08/17/2009) Creat: : 0.8 (08/17/2009)   Chol: 131 (04/15/2009)   HDL: 35.00 (04/15/2009)   LDL: 64 (04/15/2009)   TG: 159.0 (04/15/2009)  Problem # 4:  ANXIETY (ICD-300.00) Assessment: Improved  His updated medication list for this problem includes:    Paroxetine Hcl 40 Mg Tabs (Paroxetine hcl) ..... Once daily  Complete Medication List: 1)  Paroxetine Hcl 40 Mg Tabs (Paroxetine hcl) .... Once daily 2)  Fish Oil Oil (Fish oil) .Marland Kitchen.. 1 bid 3)  Vitamin D3 1000 Unit Tabs (Cholecalciferol) .Marland Kitchen.. 1 qd 4)  Lovastatin 20 Mg Tabs (Lovastatin) .... Take 1 tab by mouth daily 5)  Accupril 20 Mg Tabs (Quinapril hcl) .... Take 1 tab by mouth every day 6)  Atenolol-chlorthalidone 100-25 Mg Tabs (Atenolol-chlorthalidone) .Marland Kitchen.. 1 by mouth qd 7)  Janumet 50-1000 Mg Tabs (Sitagliptin-metformin hcl) .Marland Kitchen.. 1 by mouth two times a day for diabetes 8)  Adult Aspirin Ec Low Strength 81 Mg Tbec (Aspirin) .... Once daily  Patient Instructions: 1)  Please schedule a follow-up appointment in 4 months. 2)  BMP prior to visit, ICD-9: 3)  HbgA1C prior to visit, ICD-9: 250.00 Prescriptions: JANUMET 50-1000 MG TABS (SITAGLIPTIN-METFORMIN HCL) 1 by mouth two times a day for diabetes  #60 x 12   Entered and Authorized by:   Tresa Garter MD   Signed by:   Tresa Garter MD on 08/19/2009   Method used:   Print then Give to Patient   RxID:   [id number]

## 2010-07-15 ENCOUNTER — Other Ambulatory Visit: Payer: Self-pay

## 2010-07-19 ENCOUNTER — Other Ambulatory Visit: Payer: Self-pay | Admitting: Internal Medicine

## 2010-07-19 ENCOUNTER — Encounter (INDEPENDENT_AMBULATORY_CARE_PROVIDER_SITE_OTHER): Payer: Self-pay | Admitting: *Deleted

## 2010-07-19 ENCOUNTER — Other Ambulatory Visit: Payer: PRIVATE HEALTH INSURANCE

## 2010-07-19 DIAGNOSIS — Z0389 Encounter for observation for other suspected diseases and conditions ruled out: Secondary | ICD-10-CM

## 2010-07-19 DIAGNOSIS — Z0000 Encounter for general adult medical examination without abnormal findings: Secondary | ICD-10-CM

## 2010-07-19 DIAGNOSIS — E119 Type 2 diabetes mellitus without complications: Secondary | ICD-10-CM

## 2010-07-19 DIAGNOSIS — E785 Hyperlipidemia, unspecified: Secondary | ICD-10-CM

## 2010-07-19 LAB — URINALYSIS
Bilirubin Urine: NEGATIVE
Hgb urine dipstick: NEGATIVE
Total Protein, Urine: NEGATIVE
Urine Glucose: NEGATIVE

## 2010-07-19 LAB — LIPID PANEL
HDL: 35.5 mg/dL — ABNORMAL LOW (ref >39.00)
Triglycerides: 290 mg/dL — ABNORMAL HIGH (ref 0.0–149.0)

## 2010-07-19 LAB — HEPATIC FUNCTION PANEL
Albumin: 4.2 g/dL (ref 3.5–5.2)
Alkaline Phosphatase: 61 U/L (ref 39–117)

## 2010-07-19 LAB — CBC WITH DIFFERENTIAL/PLATELET
Basophils Relative: 0.6 % (ref 0.0–3.0)
Eosinophils Relative: 3.6 % (ref 0.0–5.0)
HCT: 46.6 % (ref 39.0–52.0)
Hemoglobin: 16.4 g/dL (ref 13.0–17.0)
Lymphocytes Relative: 33 % (ref 12.0–46.0)
Lymphs Abs: 3.1 10*3/uL (ref 0.7–4.0)
Monocytes Relative: 8.9 % (ref 3.0–12.0)
Neutro Abs: 5.1 10*3/uL (ref 1.4–7.7)
RBC: 5.09 Mil/uL (ref 4.22–5.81)
RDW: 13.9 % (ref 11.5–14.6)
WBC: 9.5 10*3/uL (ref 4.5–10.5)

## 2010-07-19 LAB — TSH: TSH: 1.92 u[IU]/mL (ref 0.35–5.50)

## 2010-07-19 LAB — BASIC METABOLIC PANEL
Calcium: 9.4 mg/dL (ref 8.4–10.5)
GFR: 121.84 mL/min (ref >60.00)
Glucose, Bld: 135 mg/dL — ABNORMAL HIGH (ref 70–99)
Potassium: 4.4 mEq/L (ref 3.5–5.1)
Sodium: 136 mEq/L (ref 135–145)

## 2010-07-22 ENCOUNTER — Encounter: Payer: Self-pay | Admitting: Internal Medicine

## 2010-07-22 ENCOUNTER — Encounter (INDEPENDENT_AMBULATORY_CARE_PROVIDER_SITE_OTHER): Payer: PRIVATE HEALTH INSURANCE | Admitting: Internal Medicine

## 2010-07-22 DIAGNOSIS — M545 Low back pain, unspecified: Secondary | ICD-10-CM | POA: Insufficient documentation

## 2010-07-22 DIAGNOSIS — Z0000 Encounter for general adult medical examination without abnormal findings: Secondary | ICD-10-CM

## 2010-07-22 DIAGNOSIS — N529 Male erectile dysfunction, unspecified: Secondary | ICD-10-CM | POA: Insufficient documentation

## 2010-07-26 NOTE — Assessment & Plan Note (Signed)
Summary: PHYSICAL / CD  #  [facility]   Vital Signs:  Patient profile:   55 year old male Height:      72 inches Weight:      205 pounds BMI:     27.90 Temp:     98.4 degrees F oral Pulse rate:   76 / minute Pulse rhythm:   regular Resp:     16 per minute BP sitting:   120 / 80  (left arm) Cuff size:   regular  Vitals Entered By: Lanier Prude, CMA(AAMA) (July 22, 2010 8:40 AM) CC: CPX Is Patient Diabetic? Yes   Primary Care Provider:  Tresa Garter MD  CC:  CPX.  History of Present Illness: The patient presents for a preventive health examination  C/o allergies - bad x 2 wks The patient presents for a follow up of hypertension, diabetes, hyperlipidemia  C/o LBP - he was given Oxy prior by a Sports Med dr for occasional use C/o ED  Current Medications (verified): 1)  Paroxetine Hcl 40 Mg  Tabs (Paroxetine Hcl) .... Once Daily 2)  Fish Oil   Oil (Fish Oil) .Marland Kitchen.. 1 Bid 3)  Vitamin D3 1000 Unit  Tabs (Cholecalciferol) .Marland Kitchen.. 1 Qd 4)  Lovastatin 20 Mg Tabs (Lovastatin) .... Take 1 Tab By Mouth Daily 5)  Accupril 20 Mg  Tabs (Quinapril Hcl) .... Take 1 Tab By Mouth Every Day 6)  Atenolol-Chlorthalidone 100-25 Mg Tabs (Atenolol-Chlorthalidone) .Marland Kitchen.. 1 By Mouth Qd 7)  Adult Aspirin Ec Low Strength 81 Mg  Tbec (Aspirin) .... Once Daily 8)  Metformin Hcl 1000 Mg Tabs (Metformin Hcl) .Marland Kitchen.. 1 By Mouth Two Times A Day For Diabetes 9)  Glimepiride 1 Mg Tabs (Glimepiride) .Marland Kitchen.. 1 By Mouth Bid  Allergies (verified): 1)  ! * Pcn 2)  Pravachol 3)  Allegra 4)  Hydrochlorothiazide  Past History:  Past Surgical History: Last updated: 04/10/2008 Denies surgical history  Family History: Last updated: 01/07/2009 Family History of CAD Male 1st degree relative <60 Family History of CAD Male 1st degree relative <50   cancer: mother (colon)   Social History: Last updated: 01/07/2009 Married with children. Current Smoker.  started at age 29.  1 ppd. 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  Past Medical History: COUGH (ICD-786.2) CRAMPS,LEG (ICD-729.82) SINUSITIS, ACUTE (ICD-461.9) TOBACCO USE DISORDER/SMOKER-SMOKING CESSATION DISCUSSED (ICD-305.1) CALLUS, TOE (ICD-700) CERUMEN IMPACTION (ICD-380.4) WELL ADULT EXAM (ICD-V70.0) HYPERLIPIDEMIA (ICD-272.4) DEPRESSION (ICD-311) ALLERGIC RHINITIS (ICD-477.9) ANXIETY (ICD-300.00) HYPERTENSION (ICD-401.9) DIABETES MELLITUS, TYPE II (ICD-250.00)  ED Allergic rhinitis Low back pain  Review of Systems  The patient denies anorexia, fever, weight loss, weight gain, vision loss, decreased hearing, hoarseness, chest pain, syncope, dyspnea on exertion, peripheral edema, prolonged cough, headaches, hemoptysis, abdominal pain, melena, hematochezia, severe indigestion/heartburn, hematuria, incontinence, genital sores, muscle weakness, suspicious skin lesions, transient blindness, difficulty walking, depression, unusual weight change, abnormal bleeding, enlarged lymph nodes, angioedema, and testicular masses.    Physical Exam  General:  NAD,overweight-appearing.   Head:  Normocephalic and atraumatic without obvious abnormalities. No apparent alopecia or balding. Eyes:  No corneal or conjunctival inflammation noted. EOMI. Perrla. Ears:  Wax B. Hearing is grossly normal bilaterally. Nose:  External nasal examination shows no deformity or inflammation. Nasal mucosa are pink and moist without lesions or exudates. Mouth:  Oral mucosa and oropharynx without lesions or exudates.  Teeth in good repair. Neck:  No deformities, masses, or tenderness noted. Chest Wall:  No deformities, masses, tenderness or gynecomastia noted. Lungs:  Normal  respiratory effort, chest expands symmetrically. Lungs are clear to auscultation, no crackles or wheezes. Heart:  Normal rate and regular rhythm. S1 and S2 normal without gallop, murmur, click, rub or other extra sounds. Abdomen:  Bowel sounds positive,abdomen soft and non-tender without masses,  organomegaly or hernias noted. Rectal:  No external abnormalities noted. Normal sphincter tone. No rectal masses or tenderness. Prostate:  no gland enlargement.   Msk:  No deformity or scoliosis noted of thoracic or lumbar spine.   Extremities:  No clubbing, cyanosis, edema, or deformity noted with normal full range of motion of all joints.   Neurologic:  No cranial nerve deficits noted. Station and gait are normal. Plantar reflexes are down-going bilaterally. DTRs are symmetrical throughout. Sensory, motor and coordinative functions appear intact. Skin:  small left foot corn Psych:  Cognition and judgment appear intact. Alert and cooperative with normal attention span and concentration. No apparent delusions, illusions, hallucinations   Impression & Recommendations:  Problem # 1:  HEALTH MAINTENANCE EXAM (ICD-V70.0) Assessment New Health and age related issues were discussed. Available screening tests and vaccinations were discussed as well. Healthy life style including good diet and exercise was discussed.  The labs were reviewed with the patient.  EKG was reviewed with the patient.  Problem # 2:  ALLERGIC RHINITIS (ICD-477.9) Assessment: Deteriorated  His updated medication list for this problem includes:    Loratadine 10 Mg Tabs (Loratadine) .Marland Kitchen... 1 by mouth once daily as needed allergies    Sudafed 12 Hour 120 Mg Xr12h-tab (Pseudoephedrine hcl) .Marland Kitchen... 1 by mouth two times a day as needed allergies    Flonase 50 Mcg/act Susp (Fluticasone propionate) .Marland Kitchen... 1 spr each nostr qd as needed  Problem # 3:  TOBACCO USE DISORDER/SMOKER-SMOKING CESSATION DISCUSSED (ICD-305.1) Assessment: Unchanged  Encouraged smoking cessation and discussed different methods for smoking cessation.   Problem # 4:  CERUMEN IMPACTION (ICD-380.4) Assessment: Unchanged asked to irrigate at home  Problem # 5:  DIABETES MELLITUS, TYPE II (ICD-250.00) Assessment: Unchanged  The following medications were  removed from the medication list:    Accupril 20 Mg Tabs (Quinapril hcl) .Marland Kitchen... Take 1 tab by mouth every day His updated medication list for this problem includes:    Adult Aspirin Ec Low Strength 81 Mg Tbec (Aspirin) ..... Once daily    Metformin Hcl 1000 Mg Tabs (Metformin hcl) .Marland Kitchen... 1 by mouth two times a day for diabetes    Glimepiride 1 Mg Tabs (Glimepiride) .Marland Kitchen... 1 by mouth bid    Accupril 20 Mg Tabs (Quinapril hcl) .Marland Kitchen... 1 by mouth qd  Labs Reviewed: Creat: 0.7 (07/19/2010)    Reviewed HgBA1c results: 6.9 (07/19/2010)  7.0 (03/23/2010)  Problem # 6:  HYPERLIPIDEMIA (ICD-272.4) Assessment: Deteriorated  His updated medication list for this problem includes:    Lovastatin 20 Mg Tabs (Lovastatin) .Marland Kitchen... Take 1 tab by mouth daily  Labs Reviewed: SGOT: 23 (07/19/2010)   SGPT: 29 (07/19/2010)   HDL:35.50 (07/19/2010), 35.00 (04/15/2009)  LDL:64 (04/15/2009), 83 (08/11/2008)  Chol:141 (07/19/2010), 131 (04/15/2009)  Trig:290.0 (07/19/2010), 159.0 (04/15/2009)  Problem # 7:  LOW BACK PAIN, CHRONIC (ICD-724.2) Assessment: Deteriorated  His updated medication list for this problem includes:    Adult Aspirin Ec Low Strength 81 Mg Tbec (Aspirin) ..... Once daily    Percocet 10-325 Mg Tabs (Oxycodone-acetaminophen) .Marland Kitchen... 1 by mouth bid as needed pain  Complete Medication List: 1)  Atenolol-chlorthalidone 100-25 Mg Tabs (Atenolol-chlorthalidone) .Marland Kitchen.. 1 by mouth qd 2)  Paroxetine Hcl 40 Mg Tabs (Paroxetine hcl) .Marland KitchenMarland KitchenMarland Kitchen  Once daily 3)  Fish Oil Oil (Fish oil) .Marland Kitchen.. 1 bid 4)  Vitamin D3 1000 Unit Tabs (Cholecalciferol) .Marland Kitchen.. 1 qd 5)  Lovastatin 20 Mg Tabs (Lovastatin) .... Take 1 tab by mouth daily 6)  Adult Aspirin Ec Low Strength 81 Mg Tbec (Aspirin) .... Once daily 7)  Metformin Hcl 1000 Mg Tabs (Metformin hcl) .Marland Kitchen.. 1 by mouth two times a day for diabetes 8)  Glimepiride 1 Mg Tabs (Glimepiride) .Marland Kitchen.. 1 by mouth bid 9)  Loratadine 10 Mg Tabs (Loratadine) .Marland Kitchen.. 1 by mouth once daily as needed  allergies 10)  Sudafed 12 Hour 120 Mg Xr12h-tab (Pseudoephedrine hcl) .Marland Kitchen.. 1 by mouth two times a day as needed allergies 11)  Flonase 50 Mcg/act Susp (Fluticasone propionate) .Marland Kitchen.. 1 spr each nostr qd as needed 12)  Percocet 10-325 Mg Tabs (Oxycodone-acetaminophen) .Marland Kitchen.. 1 by mouth bid as needed pain 13)  Viagra 100 Mg Tabs (Sildenafil citrate) .Marland Kitchen.. 1 by mouth once daily prn 14)  Accupril 20 Mg Tabs (Quinapril hcl) .Marland Kitchen.. 1 by mouth qd  Other Orders: EKG w/ Interpretation (93000)  Patient Instructions: 1)  Please schedule a follow-up appointment in 4 months. 2)  BMP prior to visit, ICD-9: 3)  Lipid Panel prior to visit, ICD-9:995.20 272.20 4)  HbgA1C prior to visit, ICD-9: Prescriptions: VIAGRA 100 MG TABS (SILDENAFIL CITRATE) 1 by mouth once daily prn  #12 x 6   Entered and Authorized by:   Tresa Garter MD   Signed by:   Tresa Garter MD on 07/22/2010   Method used:   Print then Give to Patient   RxID:   [id number] PERCOCET 10-325 MG TABS (OXYCODONE-ACETAMINOPHEN) 1 by mouth bid as needed pain  #60 x 0   Entered and Authorized by:   Tresa Garter MD   Signed by:   Tresa Garter MD on 07/22/2010   Method used:   Print then Give to Patient   RxID:   [id number] FLONASE 50 MCG/ACT SUSP (FLUTICASONE PROPIONATE) 1 spr each nostr qd as needed  #3 x 3   Entered and Authorized by:   Tresa Garter MD   Signed by:   Tresa Garter MD on 07/22/2010   Method used:   Print then Give to Patient   RxID:   [id number] SUDAFED 12 HOUR 120 MG XR12H-TAB (PSEUDOEPHEDRINE HCL) 1 by mouth two times a day as needed allergies  #60 x 1   Entered and Authorized by:   Tresa Garter MD   Signed by:   Tresa Garter MD on 07/22/2010   Method used:   Print then Give to Patient   RxID:   [id number] LORATADINE 10 MG TABS (LORATADINE) 1 by mouth once daily as needed allergies  #90 x 3   Entered and Authorized by:   Tresa Garter MD    Signed by:   Tresa Garter MD on 07/22/2010   Method used:   Print then Give to Patient   RxID:   [id number]    Orders Added: 1)  EKG w/ Interpretation [93000] 2)  Est. Patient 40-64 years [16010]

## 2010-08-15 ENCOUNTER — Other Ambulatory Visit: Payer: Self-pay | Admitting: *Deleted

## 2010-08-15 ENCOUNTER — Telehealth: Payer: Self-pay | Admitting: *Deleted

## 2010-08-15 MED ORDER — METFORMIN HCL 1000 MG PO TABS: 1000.0000 mg | ORAL_TABLET | Freq: Two times a day (BID) | ORAL | Status: DC

## 2010-08-15 MED ORDER — PAROXETINE HCL 40 MG PO TABS: 40.0000 mg | ORAL_TABLET | ORAL | Status: DC

## 2010-08-15 MED ORDER — LOVASTATIN 20 MG PO TABS: 20.0000 mg | ORAL_TABLET | Freq: Every day | ORAL | Status: DC

## 2010-08-15 MED ORDER — QUINAPRIL HCL 20 MG PO TABS: 20.0000 mg | ORAL_TABLET | Freq: Every day | ORAL | Status: DC

## 2010-08-15 NOTE — Telephone Encounter (Signed)
rf sent 

## 2010-09-23 NOTE — Assessment & Plan Note (Signed)
[facility]                           PRIMARY CARE OFFICE NOTE   Aaron Weaver, Aaron Weaver                       MRN:          [id number]  DATE:07/12/2006                            DOB:          11-08-1955    REASON FOR VISIT:  The patient is a 55 year old male who presents to  establish primary care with me.   HISTORY:  He has no history of allergies, low back pain, or diabetes.   PAST MEDICAL HISTORY:  Anxiety and hypertension.   FAMILY HISTORY:  Father is living. His mother died of colon cancer at  age 51.   MEDICATIONS:  1. Atenolol 100 mg daily.  2. Actos 30 mg daily.  3. Metformin 2000 mg daily.  4. Paxil 40 mg daily.  5. Oxycodone rarely for back pain.   SOCIAL HISTORY:  He is married. He smokes one pack per day. He is a Programmer, systems.   REVIEW OF SYSTEMS:  Negative for chest pain or shortness of breath. No  syncope. No neurologic complaints. Occasional back pain. The rest of the  18 point review of systems is negative.   PHYSICAL EXAMINATION:  VITAL SIGNS: Blood pressure 147/91, pulse 61,  temperature 97, weight 210 pounds.  GENERAL: He looks good and is no acute distress.  HEENT: Moist mucosa.  NECK: Supple, no thyromegaly or bruit.  LUNGS: Clear to auscultation and percussion. No wheeze or rales.  HEART: S1, S2, no murmur or gallop.  ABDOMEN: Soft and nontender, no organomegaly or masses.  EXTREMITIES: Without edema.  NEUROLOGIC: He is alert and oriented. He denies being depressed.  RECTAL: Reveals normal prostate. Stool is guaiac negative. No masses.   LABORATORY DATA:  07/09/06 CBC normal, glucose 134, cholesterol 201, LDL  130, TSH normal, PSA 0.7. Urinalysis normal. EKG with normal sinus  rhythm.   ASSESSMENT AND PLAN:  1. Normal wellness examination.  Age/health related issues discussed.      Healthy lifestyle discussed. Obtain chest x-ray and start baby      aspirin daily. He has had colonoscopy in 2004.  2. Family history  of colon cancer. He will be due for colonoscopy in      2009 by Dr. Russella Dar.  3. Anxiety. He is doing well. Continue current therapy p.r.n.  4. Hypertension, controlled. He is a little anxious today.  5. Type-2 diabetes. Continue current therapy. I will see him back in 4      months with lab work.     Georgina Quint. Plotnikov, MD  Electronically Signed    AVP/MedQ  DD: 07/15/2006  DT: 07/16/2006  Job #: [id number]

## 2010-11-16 ENCOUNTER — Encounter: Payer: Self-pay | Admitting: Internal Medicine

## 2010-11-17 ENCOUNTER — Encounter: Payer: Self-pay | Admitting: Internal Medicine

## 2010-11-18 ENCOUNTER — Other Ambulatory Visit: Payer: Self-pay | Admitting: Internal Medicine

## 2010-11-18 ENCOUNTER — Other Ambulatory Visit (INDEPENDENT_AMBULATORY_CARE_PROVIDER_SITE_OTHER): Payer: PRIVATE HEALTH INSURANCE

## 2010-11-18 DIAGNOSIS — E782 Mixed hyperlipidemia: Secondary | ICD-10-CM

## 2010-11-18 DIAGNOSIS — T887XXA Unspecified adverse effect of drug or medicament, initial encounter: Secondary | ICD-10-CM

## 2010-11-18 LAB — BASIC METABOLIC PANEL
BUN: 20 mg/dL (ref 6–23)
CO2: 28 mEq/L (ref 19–32)
Calcium: 9.2 mg/dL (ref 8.4–10.5)
Creatinine, Ser: 0.7 mg/dL (ref 0.4–1.5)
Glucose, Bld: 138 mg/dL — ABNORMAL HIGH (ref 70–99)

## 2010-11-18 LAB — HEMOGLOBIN A1C: Hgb A1c MFr Bld: 7.1 % — ABNORMAL HIGH (ref 4.6–6.5)

## 2010-11-18 LAB — LIPID PANEL
HDL: 37.8 mg/dL — ABNORMAL LOW (ref >39.00)
Total CHOL/HDL Ratio: 4

## 2010-11-23 ENCOUNTER — Encounter: Payer: Self-pay | Admitting: Internal Medicine

## 2010-11-23 ENCOUNTER — Ambulatory Visit (INDEPENDENT_AMBULATORY_CARE_PROVIDER_SITE_OTHER): Payer: PRIVATE HEALTH INSURANCE | Admitting: Internal Medicine

## 2010-11-23 DIAGNOSIS — E785 Hyperlipidemia, unspecified: Secondary | ICD-10-CM

## 2010-11-23 DIAGNOSIS — I10 Essential (primary) hypertension: Secondary | ICD-10-CM

## 2010-11-23 DIAGNOSIS — J309 Allergic rhinitis, unspecified: Secondary | ICD-10-CM

## 2010-11-23 DIAGNOSIS — E119 Type 2 diabetes mellitus without complications: Secondary | ICD-10-CM

## 2010-11-23 NOTE — Assessment & Plan Note (Signed)
On Rx 

## 2010-11-23 NOTE — Assessment & Plan Note (Signed)
Local [employer]rr[employer] sinnuses

## 2010-11-23 NOTE — Progress Notes (Signed)
  Subjective:    Patient ID: Aaron Weaver, male    DOB: 11/24/1955, 55 y.o.   MRN: [id number]  HPI  The patient presents for a follow-up of  chronic hypertension, chronic dyslipidemia, type 2 diabetes controlled with medicines  Wt Readings from Last 3 Encounters:  11/23/10 202 lb (91.627 kg)  07/22/10 205 lb (92.987 kg)  03/25/10 204 lb (92.534 kg)     Review of Systems  Constitutional: Negative for appetite change, fatigue and unexpected weight change.  HENT: Negative for nosebleeds, congestion, sore throat, sneezing, trouble swallowing and neck pain.   Eyes: Negative for itching and visual disturbance.  Respiratory: Negative for cough.   Cardiovascular: Negative for chest pain, palpitations and leg swelling.  Gastrointestinal: Negative for nausea, diarrhea, blood in stool and abdominal distention.  Genitourinary: Negative for frequency and hematuria.  Musculoskeletal: Negative for back pain, joint swelling and gait problem.  Skin: Negative for rash.  Neurological: Negative for dizziness, tremors, speech difficulty and weakness.  Psychiatric/Behavioral: Negative for sleep disturbance, dysphoric mood and agitation. The patient is not nervous/anxious.        Objective:   Physical Exam  Constitutional: He is oriented to person, place, and time. He appears well-developed.  HENT:  Mouth/Throat: Oropharynx is clear and moist.  Eyes: Conjunctivae are normal. Pupils are equal, round, and reactive to light.  Neck: Normal range of motion. No JVD present. No thyromegaly present.  Cardiovascular: Normal rate, regular rhythm, normal heart sounds and intact distal pulses.  Exam reveals no gallop and no friction rub.   No murmur heard. Pulmonary/Chest: Effort normal and breath sounds normal. No respiratory distress. He has no wheezes. He has no rales. He exhibits no tenderness.  Abdominal: Soft. Bowel sounds are normal. He exhibits no distension and no mass. There is no tenderness. There  is no rebound and no guarding.  Musculoskeletal: Normal range of motion. He exhibits no edema and no tenderness.  Lymphadenopathy:    He has no cervical adenopathy.  Neurological: He is alert and oriented to person, place, and time. He has normal reflexes. No cranial nerve deficit. He exhibits normal muscle tone. Coordination normal.  Skin: Skin is warm and dry. No rash noted.  Psychiatric: He has a normal mood and affect. His behavior is normal. Judgment and thought content normal.        Lab Results  Component Value Date   WBC 9.5 07/19/2010   HGB 16.4 07/19/2010   HCT 46.6 07/19/2010   PLT 195.0 07/19/2010   CHOL 137 11/18/2010   TRIG 186.0* 11/18/2010   HDL 37.80* 11/18/2010   LDLDIRECT 73.2 07/19/2010   ALT 29 07/19/2010   AST 23 07/19/2010   NA 137 11/18/2010   K 4.1 11/18/2010   CL 100 11/18/2010   CREATININE 0.7 11/18/2010   BUN 20 11/18/2010   CO2 28 11/18/2010   TSH 1.92 07/19/2010   PSA 0.72 07/19/2010   HGBA1C 7.1* 11/18/2010      Assessment & Plan:

## 2011-01-03 ENCOUNTER — Other Ambulatory Visit: Payer: Self-pay | Admitting: Internal Medicine

## 2011-03-24 ENCOUNTER — Other Ambulatory Visit (INDEPENDENT_AMBULATORY_CARE_PROVIDER_SITE_OTHER): Payer: PRIVATE HEALTH INSURANCE

## 2011-03-24 DIAGNOSIS — E785 Hyperlipidemia, unspecified: Secondary | ICD-10-CM

## 2011-03-24 DIAGNOSIS — E119 Type 2 diabetes mellitus without complications: Secondary | ICD-10-CM

## 2011-03-24 DIAGNOSIS — I10 Essential (primary) hypertension: Secondary | ICD-10-CM

## 2011-03-24 LAB — COMPREHENSIVE METABOLIC PANEL
Albumin: 4 g/dL (ref 3.5–5.2)
Alkaline Phosphatase: 53 U/L (ref 39–117)
CO2: 26 mEq/L (ref 19–32)
Glucose, Bld: 128 mg/dL — ABNORMAL HIGH (ref 70–99)
Potassium: 3.8 mEq/L (ref 3.5–5.1)
Sodium: 141 mEq/L (ref 135–145)
Total Protein: 7.4 g/dL (ref 6.0–8.3)

## 2011-03-28 ENCOUNTER — Ambulatory Visit (INDEPENDENT_AMBULATORY_CARE_PROVIDER_SITE_OTHER): Payer: PRIVATE HEALTH INSURANCE | Admitting: Internal Medicine

## 2011-03-28 ENCOUNTER — Encounter: Payer: Self-pay | Admitting: Internal Medicine

## 2011-03-28 ENCOUNTER — Ambulatory Visit (INDEPENDENT_AMBULATORY_CARE_PROVIDER_SITE_OTHER)
Admission: RE | Admit: 2011-03-28 | Discharge: 2011-03-28 | Disposition: A | Discharge status: Home / Self Care | Payer: PRIVATE HEALTH INSURANCE | Source: Ambulatory Visit | Attending: Internal Medicine | Admitting: Internal Medicine

## 2011-03-28 VITALS — BP 112/88 | HR 76 | Temp 97.8°F | Resp 16 | Wt 205.0 lb

## 2011-03-28 DIAGNOSIS — I10 Essential (primary) hypertension: Secondary | ICD-10-CM

## 2011-03-28 DIAGNOSIS — E785 Hyperlipidemia, unspecified: Secondary | ICD-10-CM

## 2011-03-28 DIAGNOSIS — M25552 Pain in left hip: Secondary | ICD-10-CM

## 2011-03-28 DIAGNOSIS — E119 Type 2 diabetes mellitus without complications: Secondary | ICD-10-CM

## 2011-03-28 DIAGNOSIS — Z23 Encounter for immunization: Secondary | ICD-10-CM

## 2011-03-28 DIAGNOSIS — M25559 Pain in unspecified hip: Secondary | ICD-10-CM

## 2011-03-28 DIAGNOSIS — F17200 Nicotine dependence, unspecified, uncomplicated: Secondary | ICD-10-CM

## 2011-03-28 MED ORDER — IBUPROFEN 600 MG PO TABS: ORAL_TABLET | ORAL | Status: AC

## 2011-03-28 NOTE — Assessment & Plan Note (Signed)
Continue with current prescription therapy as reflected on the Med list.  

## 2011-03-28 NOTE — Assessment & Plan Note (Signed)
Discussed.

## 2011-03-28 NOTE — Assessment & Plan Note (Signed)
X ray  Ibuprofen and stretching

## 2011-03-28 NOTE — Patient Instructions (Signed)
IT band stretches on [email or web address]

## 2011-03-28 NOTE — Progress Notes (Signed)
  Subjective:    Patient ID: Aaron Weaver, male    DOB: 1955/10/02, 55 y.o.   MRN: [id number]  HPI The patient presents for a follow-up of  chronic hypertension, chronic dyslipidemia, type 2 diabetes controlled with medicines  C/o bad pain in L hip x 1 mo worse with up or down   Review of Systems  Constitutional: Negative for appetite change, fatigue and unexpected weight change.  HENT: Negative for nosebleeds, congestion, sore throat, sneezing, trouble swallowing and neck pain.   Eyes: Negative for itching and visual disturbance.  Respiratory: Negative for cough.   Cardiovascular: Negative for chest pain, palpitations and leg swelling.  Gastrointestinal: Negative for nausea, diarrhea, blood in stool and abdominal distention.  Genitourinary: Negative for frequency and hematuria.  Musculoskeletal: Negative for back pain, joint swelling and gait problem.       L hip pain  Skin: Negative for rash.  Neurological: Negative for dizziness, tremors, speech difficulty and weakness.  Psychiatric/Behavioral: Negative for sleep disturbance, dysphoric mood and agitation. The patient is not nervous/anxious.        Objective:   Physical Exam  Constitutional: He is oriented to person, place, and time. He appears well-developed.  HENT:  Mouth/Throat: Oropharynx is clear and moist.  Eyes: Conjunctivae are normal. Pupils are equal, round, and reactive to light.  Neck: Normal range of motion. No JVD present. No thyromegaly present.  Cardiovascular: Normal rate, regular rhythm, normal heart sounds and intact distal pulses.  Exam reveals no gallop and no friction rub.   No murmur heard. Pulmonary/Chest: Effort normal and breath sounds normal. No respiratory distress. He has no wheezes. He has no rales. He exhibits no tenderness.  Abdominal: Soft. Bowel sounds are normal. He exhibits no distension and no mass. There is no tenderness. There is no rebound and no guarding.  Musculoskeletal: Normal range  of motion. He exhibits no edema and no tenderness.       L hip is NT w/palp or ROM Str leg elev is neg B  Lymphadenopathy:    He has no cervical adenopathy.  Neurological: He is alert and oriented to person, place, and time. He has normal reflexes. No cranial nerve deficit. He exhibits normal muscle tone. Coordination normal.  Skin: Skin is warm and dry. No rash noted.  Psychiatric: He has a normal mood and affect. His behavior is normal. Judgment and thought content normal.    Lab Results  Component Value Date   WBC 9.5 07/19/2010   HGB 16.4 07/19/2010   HCT 46.6 07/19/2010   PLT 195.0 07/19/2010   GLUCOSE 128* 03/24/2011   CHOL 137 11/18/2010   TRIG 186.0* 11/18/2010   HDL 37.80* 11/18/2010   LDLDIRECT 73.2 07/19/2010   LDLCALC 62 11/18/2010   ALT 33 03/24/2011   AST 22 03/24/2011   NA 141 03/24/2011   K 3.8 03/24/2011   CL 105 03/24/2011   CREATININE 1.0 03/24/2011   BUN 24* 03/24/2011   CO2 26 03/24/2011   TSH 1.92 07/19/2010   PSA 0.72 07/19/2010   HGBA1C 6.9* 03/24/2011         Assessment & Plan:

## 2011-03-29 ENCOUNTER — Telehealth: Payer: Self-pay | Admitting: Internal Medicine

## 2011-03-29 NOTE — Telephone Encounter (Signed)
He is aware of results

## 2011-04-18 ENCOUNTER — Other Ambulatory Visit: Payer: Self-pay | Admitting: Internal Medicine

## 2011-05-13 ENCOUNTER — Other Ambulatory Visit: Payer: Self-pay | Admitting: Internal Medicine

## 2011-07-11 ENCOUNTER — Other Ambulatory Visit: Payer: Self-pay | Admitting: Internal Medicine

## 2011-07-21 ENCOUNTER — Other Ambulatory Visit (INDEPENDENT_AMBULATORY_CARE_PROVIDER_SITE_OTHER): Payer: PRIVATE HEALTH INSURANCE

## 2011-07-21 DIAGNOSIS — E119 Type 2 diabetes mellitus without complications: Secondary | ICD-10-CM

## 2011-07-21 LAB — BASIC METABOLIC PANEL
BUN: 21 mg/dL (ref 6–23)
Calcium: 9.1 mg/dL (ref 8.4–10.5)
Creatinine, Ser: 0.9 mg/dL (ref 0.4–1.5)
GFR: 93.54 mL/min (ref >60.00)
Glucose, Bld: 124 mg/dL — ABNORMAL HIGH (ref 70–99)

## 2011-07-21 LAB — HEMOGLOBIN A1C: Hgb A1c MFr Bld: 7 % — ABNORMAL HIGH (ref 4.6–6.5)

## 2011-07-26 ENCOUNTER — Ambulatory Visit (INDEPENDENT_AMBULATORY_CARE_PROVIDER_SITE_OTHER): Payer: PRIVATE HEALTH INSURANCE | Admitting: Internal Medicine

## 2011-07-26 ENCOUNTER — Encounter: Payer: Self-pay | Admitting: Internal Medicine

## 2011-07-26 VITALS — BP 112/86 | HR 76 | Temp 98.6°F | Resp 16 | Wt 204.0 lb

## 2011-07-26 DIAGNOSIS — N529 Male erectile dysfunction, unspecified: Secondary | ICD-10-CM

## 2011-07-26 DIAGNOSIS — M25552 Pain in left hip: Secondary | ICD-10-CM

## 2011-07-26 DIAGNOSIS — G2581 Restless legs syndrome: Secondary | ICD-10-CM | POA: Insufficient documentation

## 2011-07-26 DIAGNOSIS — Z0000 Encounter for general adult medical examination without abnormal findings: Secondary | ICD-10-CM

## 2011-07-26 DIAGNOSIS — M25559 Pain in unspecified hip: Secondary | ICD-10-CM

## 2011-07-26 DIAGNOSIS — E119 Type 2 diabetes mellitus without complications: Secondary | ICD-10-CM

## 2011-07-26 MED ORDER — QUINAPRIL HCL 20 MG PO TABS: 20.0000 mg | ORAL_TABLET | Freq: Every day | ORAL | Status: DC

## 2011-07-26 MED ORDER — SILDENAFIL CITRATE 100 MG PO TABS: 100.0000 mg | ORAL_TABLET | Freq: Every day | ORAL | Status: DC | PRN

## 2011-07-26 MED ORDER — PAROXETINE HCL 40 MG PO TABS: 40.0000 mg | ORAL_TABLET | Freq: Every day | ORAL | Status: DC

## 2011-07-26 MED ORDER — ATENOLOL-CHLORTHALIDONE 100-25 MG PO TABS: 1.0000 | ORAL_TABLET | Freq: Every day | ORAL | Status: DC

## 2011-07-26 MED ORDER — OXYCODONE-ACETAMINOPHEN 10-325 MG PO TABS: 1.0000 | ORAL_TABLET | Freq: Two times a day (BID) | ORAL | Status: DC | PRN

## 2011-07-26 MED ORDER — GLIMEPIRIDE 1 MG PO TABS: 1.0000 mg | ORAL_TABLET | Freq: Two times a day (BID) | ORAL | Status: DC

## 2011-07-26 MED ORDER — ROPINIROLE HCL 0.5 MG PO TABS: 0.5000 mg | ORAL_TABLET | Freq: Every day | ORAL | Status: DC

## 2011-07-26 MED ORDER — LOVASTATIN 20 MG PO TABS: 20.0000 mg | ORAL_TABLET | Freq: Every day | ORAL | Status: DC

## 2011-07-26 MED ORDER — METFORMIN HCL 1000 MG PO TABS: 1000.0000 mg | ORAL_TABLET | Freq: Two times a day (BID) | ORAL | Status: DC

## 2011-07-26 NOTE — Assessment & Plan Note (Signed)
Try requip

## 2011-07-26 NOTE — Patient Instructions (Signed)
[email or web address] -------  IT band stretch exercises

## 2011-07-26 NOTE — Assessment & Plan Note (Signed)
Continue with current prescription therapy as reflected on the Med list.  

## 2011-07-26 NOTE — Progress Notes (Signed)
Patient ID: Aaron Weaver, male   DOB: [birth year], 56 y.o.   MRN: [id number]  Subjective:    Patient ID: Aaron Weaver, male    DOB: [birth year], 56 y.o.   MRN: [id number]  Hip Pain    The patient presents for a follow-up of  chronic hypertension, chronic dyslipidemia, type 2 diabetes controlled with medicines  C/o restless legs  C/o bad pain in L hip x 4 mo worse with getting up or down, not better  BP Readings from Last 3 Encounters:  07/26/11 112/86  03/28/11 112/88  11/23/10 128/90   Wt Readings from Last 3 Encounters:  07/26/11 204 lb (92.534 kg)  03/28/11 205 lb (92.987 kg)  11/23/10 202 lb (91.627 kg)       Review of Systems  Constitutional: Negative for appetite change, fatigue and unexpected weight change.  HENT: Negative for nosebleeds, congestion, sore throat, sneezing, trouble swallowing and neck pain.   Eyes: Negative for itching and visual disturbance.  Respiratory: Negative for cough.   Cardiovascular: Negative for chest pain, palpitations and leg swelling.  Gastrointestinal: Negative for nausea, diarrhea, blood in stool and abdominal distention.  Genitourinary: Negative for frequency and hematuria.  Musculoskeletal: Negative for back pain, joint swelling and gait problem.       L hip pain  Skin: Negative for rash.  Neurological: Negative for dizziness, tremors, speech difficulty and weakness.  Psychiatric/Behavioral: Negative for sleep disturbance, dysphoric mood and agitation. The patient is not nervous/anxious.        Objective:   Physical Exam  Constitutional: He is oriented to person, place, and time. He appears well-developed.  HENT:  Mouth/Throat: Oropharynx is clear and moist.  Eyes: Conjunctivae are normal. Pupils are equal, round, and reactive to light.  Neck: Normal range of motion. No JVD present. No thyromegaly present.  Cardiovascular: Normal rate, regular rhythm, normal heart sounds and intact distal pulses.  Exam reveals no gallop and  no friction rub.   No murmur heard. Pulmonary/Chest: Effort normal and breath sounds normal. No respiratory distress. He has no wheezes. He has no rales. He exhibits no tenderness.  Abdominal: Soft. Bowel sounds are normal. He exhibits no distension and no mass. There is no tenderness. There is no rebound and no guarding.  Musculoskeletal: Normal range of motion. He exhibits no edema and no tenderness.       L hip is NT w/palp or ROM Str leg elev is neg B  Lymphadenopathy:    He has no cervical adenopathy.  Neurological: He is alert and oriented to person, place, and time. He has normal reflexes. No cranial nerve deficit. He exhibits normal muscle tone. Coordination normal.  Skin: Skin is warm and dry. No rash noted.  Psychiatric: He has a normal mood and affect. His behavior is normal. Judgment and thought content normal.    Lab Results  Component Value Date   WBC 9.5 07/19/2010   HGB 16.4 07/19/2010   HCT 46.6 07/19/2010   PLT 195.0 07/19/2010   GLUCOSE 124* 07/21/2011   CHOL 137 11/18/2010   TRIG 186.0* 11/18/2010   HDL 37.80* 11/18/2010   LDLDIRECT 73.2 07/19/2010   LDLCALC 62 11/18/2010   ALT 33 03/24/2011   AST 22 03/24/2011   NA 138 07/21/2011   K 4.9 07/21/2011   CL 105 07/21/2011   CREATININE 0.9 07/21/2011   BUN 21 07/21/2011   CO2 26 07/21/2011   TSH 1.92 07/19/2010   PSA 0.72 07/19/2010   HGBA1C 7.0* 07/21/2011  Assessment & Plan:

## 2011-07-26 NOTE — Assessment & Plan Note (Signed)
Stretch IT

## 2011-07-27 ENCOUNTER — Other Ambulatory Visit: Payer: Self-pay | Admitting: Internal Medicine

## 2011-10-17 ENCOUNTER — Other Ambulatory Visit: Payer: Self-pay | Admitting: Internal Medicine

## 2011-11-23 ENCOUNTER — Other Ambulatory Visit (INDEPENDENT_AMBULATORY_CARE_PROVIDER_SITE_OTHER): Payer: PRIVATE HEALTH INSURANCE

## 2011-11-23 DIAGNOSIS — Z0000 Encounter for general adult medical examination without abnormal findings: Secondary | ICD-10-CM

## 2011-11-23 DIAGNOSIS — E119 Type 2 diabetes mellitus without complications: Secondary | ICD-10-CM

## 2011-11-23 DIAGNOSIS — M25559 Pain in unspecified hip: Secondary | ICD-10-CM

## 2011-11-23 DIAGNOSIS — M25552 Pain in left hip: Secondary | ICD-10-CM

## 2011-11-23 DIAGNOSIS — N529 Male erectile dysfunction, unspecified: Secondary | ICD-10-CM

## 2011-11-23 LAB — PSA: PSA: 0.67 ng/mL (ref 0.10–4.00)

## 2011-11-23 LAB — URINALYSIS
Leukocytes, UA: NEGATIVE
Nitrite: NEGATIVE
Specific Gravity, Urine: 1.025 (ref 1.000–1.030)
Urine Glucose: NEGATIVE
Urobilinogen, UA: 0.2 (ref 0.0–1.0)

## 2011-11-23 LAB — LIPID PANEL
Cholesterol: 117 mg/dL (ref 0–200)
HDL: 35.1 mg/dL — ABNORMAL LOW (ref >39.00)
VLDL: 17 mg/dL (ref 0.0–40.0)

## 2011-11-23 LAB — BASIC METABOLIC PANEL
BUN: 24 mg/dL — ABNORMAL HIGH (ref 6–23)
GFR: 99.88 mL/min (ref >60.00)
Potassium: 3.9 mEq/L (ref 3.5–5.1)

## 2011-11-23 LAB — CBC WITH DIFFERENTIAL/PLATELET
Eosinophils Relative: 3.4 % (ref 0.0–5.0)
HCT: 44.1 % (ref 39.0–52.0)
Monocytes Relative: 7.4 % (ref 3.0–12.0)
Neutrophils Relative %: 54.7 % (ref 43.0–77.0)
Platelets: 170 10*3/uL (ref 150.0–400.0)
WBC: 8.4 10*3/uL (ref 4.5–10.5)

## 2011-11-23 LAB — HEPATIC FUNCTION PANEL
ALT: 33 U/L (ref 0–53)
Total Protein: 7 g/dL (ref 6.0–8.3)

## 2011-11-23 LAB — HEMOGLOBIN A1C: Hgb A1c MFr Bld: 7.1 % — ABNORMAL HIGH (ref 4.6–6.5)

## 2011-11-27 ENCOUNTER — Ambulatory Visit (INDEPENDENT_AMBULATORY_CARE_PROVIDER_SITE_OTHER): Payer: PRIVATE HEALTH INSURANCE | Admitting: Internal Medicine

## 2011-11-27 ENCOUNTER — Encounter: Payer: Self-pay | Admitting: Internal Medicine

## 2011-11-27 VITALS — BP 152/100 | HR 76 | Temp 98.5°F | Resp 16 | Wt 198.0 lb

## 2011-11-27 DIAGNOSIS — Z0000 Encounter for general adult medical examination without abnormal findings: Secondary | ICD-10-CM | POA: Insufficient documentation

## 2011-11-27 DIAGNOSIS — E119 Type 2 diabetes mellitus without complications: Secondary | ICD-10-CM

## 2011-11-27 DIAGNOSIS — F17200 Nicotine dependence, unspecified, uncomplicated: Secondary | ICD-10-CM

## 2011-11-27 DIAGNOSIS — E785 Hyperlipidemia, unspecified: Secondary | ICD-10-CM

## 2011-11-27 MED ORDER — ATENOLOL-CHLORTHALIDONE 100-25 MG PO TABS: 1.0000 | ORAL_TABLET | Freq: Every day | ORAL | Status: DC

## 2011-11-27 MED ORDER — METFORMIN HCL 1000 MG PO TABS: 1000.0000 mg | ORAL_TABLET | Freq: Two times a day (BID) | ORAL | Status: DC

## 2011-11-27 NOTE — Assessment & Plan Note (Addendum)
We discussed age appropriate health related issues, including available/recomended screening tests and vaccinations. We discussed a need for adhering to healthy diet and exercise. Labs/EKG were reviewed/ordered. All questions were answered.  Ophth cons, zostavax adviced

## 2011-11-27 NOTE — Progress Notes (Signed)
Subjective:    Patient ID: Aaron Weaver, male    DOB: 1955-07-31, 56 y.o.   MRN: [id number]  The patient is here for a wellness exam. The patient has been doing well overall without major physical or psychological issues going on lately. HPI The patient presents for a follow-up of  chronic hypertension, chronic dyslipidemia, type 2 diabetes controlled with medicines. BP is nl - he ran out of atenolol last night  F/u on restless legs  F/u on bad pain in L hip - much better  BP Readings from Last 3 Encounters:  11/27/11 152/100  07/26/11 112/86  03/28/11 112/88   Wt Readings from Last 3 Encounters:  11/27/11 198 lb (89.812 kg)  07/26/11 204 lb (92.534 kg)  03/28/11 205 lb (92.987 kg)       Review of Systems  Constitutional: Negative for appetite change, fatigue and unexpected weight change.  HENT: Negative for nosebleeds, congestion, sore throat, sneezing, trouble swallowing and neck pain.   Eyes: Negative for itching and visual disturbance.  Respiratory: Negative for cough.   Cardiovascular: Negative for chest pain, palpitations and leg swelling.  Gastrointestinal: Negative for nausea, diarrhea, blood in stool and abdominal distention.  Genitourinary: Negative for frequency and hematuria.  Musculoskeletal: Negative for back pain, joint swelling and gait problem.       L hip pain  Skin: Negative for rash.  Neurological: Negative for dizziness, tremors, speech difficulty and weakness.  Psychiatric/Behavioral: Negative for disturbed wake/sleep cycle, dysphoric mood and agitation. The patient is not nervous/anxious.        Objective:   Physical Exam  Constitutional: He is oriented to person, place, and time. He appears well-developed.  HENT:  Mouth/Throat: Oropharynx is clear and moist.  Eyes: Conjunctivae are normal. Pupils are equal, round, and reactive to light.  Neck: Normal range of motion. No JVD present. No thyromegaly present.  Cardiovascular: Normal rate,  regular rhythm, normal heart sounds and intact distal pulses.  Exam reveals no gallop and no friction rub.   No murmur heard. Pulmonary/Chest: Effort normal and breath sounds normal. No respiratory distress. He has no wheezes. He has no rales. He exhibits no tenderness.  Abdominal: Soft. Bowel sounds are normal. He exhibits no distension and no mass. There is no tenderness. There is no rebound and no guarding.  Genitourinary: Rectum normal, prostate normal and penis normal. Guaiac negative stool. No penile tenderness.  Musculoskeletal: Normal range of motion. He exhibits no edema and no tenderness.       L hip is NT w/palp or ROM Str leg elev is neg B  Lymphadenopathy:    He has no cervical adenopathy.  Neurological: He is alert and oriented to person, place, and time. He has normal reflexes. No cranial nerve deficit. He exhibits normal muscle tone. Coordination normal.  Skin: Skin is warm and dry. No rash noted.  Psychiatric: He has a normal mood and affect. His behavior is normal. Judgment and thought content normal.    Lab Results  Component Value Date   WBC 8.4 11/23/2011   HGB 14.8 11/23/2011   HCT 44.1 11/23/2011   PLT 170.0 11/23/2011   GLUCOSE 144* 11/23/2011   CHOL 117 11/23/2011   TRIG 85.0 11/23/2011   HDL 35.10* 11/23/2011   LDLDIRECT 73.2 07/19/2010   LDLCALC 65 11/23/2011   ALT 33 11/23/2011   AST 27 11/23/2011   NA 138 11/23/2011   K 3.9 11/23/2011   CL 101 11/23/2011   CREATININE 0.8 11/23/2011  BUN 24* 11/23/2011   CO2 27 11/23/2011   TSH 1.12 11/23/2011   PSA 0.67 11/23/2011   HGBA1C 7.1* 11/23/2011         Assessment & Plan:

## 2011-11-27 NOTE — Assessment & Plan Note (Signed)
Continue with current prescription therapy as reflected on the Med list.  

## 2011-11-27 NOTE — Assessment & Plan Note (Signed)
Discussed.

## 2011-12-09 ENCOUNTER — Other Ambulatory Visit: Payer: Self-pay | Admitting: Internal Medicine

## 2011-12-11 ENCOUNTER — Other Ambulatory Visit: Payer: Self-pay | Admitting: Internal Medicine

## 2012-03-13 ENCOUNTER — Other Ambulatory Visit: Payer: Self-pay | Admitting: Internal Medicine

## 2012-03-25 ENCOUNTER — Telehealth: Payer: Self-pay | Admitting: *Deleted

## 2012-03-25 MED ORDER — GLIMEPIRIDE 2 MG PO TABS: 2.0000 mg | ORAL_TABLET | Freq: Every day | ORAL | Status: DC

## 2012-03-25 NOTE — Telephone Encounter (Signed)
Rec fax requesting to change Glimepiride 1 mg to a higher strength so that he doesn't have to take 2 qd or to something else. Please advise.

## 2012-03-25 NOTE — Telephone Encounter (Signed)
Done. Thx.

## 2012-03-25 NOTE — Telephone Encounter (Signed)
Pts wife informed.

## 2012-03-28 ENCOUNTER — Emergency Department (HOSPITAL_COMMUNITY)
Admission: EM | Admit: 2012-03-28 | Discharge: 2012-03-28 | Disposition: A | Discharge status: Home / Self Care | Payer: Worker's Compensation | Attending: Emergency Medicine | Admitting: Emergency Medicine

## 2012-03-28 ENCOUNTER — Encounter (HOSPITAL_COMMUNITY): Payer: Self-pay | Admitting: Emergency Medicine

## 2012-03-28 ENCOUNTER — Emergency Department (HOSPITAL_COMMUNITY): Payer: Worker's Compensation

## 2012-03-28 DIAGNOSIS — R252 Cramp and spasm: Secondary | ICD-10-CM | POA: Insufficient documentation

## 2012-03-28 DIAGNOSIS — F411 Generalized anxiety disorder: Secondary | ICD-10-CM | POA: Insufficient documentation

## 2012-03-28 DIAGNOSIS — I10 Essential (primary) hypertension: Secondary | ICD-10-CM | POA: Insufficient documentation

## 2012-03-28 DIAGNOSIS — F17200 Nicotine dependence, unspecified, uncomplicated: Secondary | ICD-10-CM | POA: Insufficient documentation

## 2012-03-28 DIAGNOSIS — T59894A Toxic effect of other specified gases, fumes and vapors, undetermined, initial encounter: Secondary | ICD-10-CM | POA: Insufficient documentation

## 2012-03-28 DIAGNOSIS — E119 Type 2 diabetes mellitus without complications: Secondary | ICD-10-CM | POA: Insufficient documentation

## 2012-03-28 DIAGNOSIS — F329 Major depressive disorder, single episode, unspecified: Secondary | ICD-10-CM | POA: Insufficient documentation

## 2012-03-28 DIAGNOSIS — Z8709 Personal history of other diseases of the respiratory system: Secondary | ICD-10-CM | POA: Insufficient documentation

## 2012-03-28 DIAGNOSIS — R110 Nausea: Secondary | ICD-10-CM | POA: Insufficient documentation

## 2012-03-28 DIAGNOSIS — S0093XA Contusion of unspecified part of head, initial encounter: Secondary | ICD-10-CM

## 2012-03-28 DIAGNOSIS — F3289 Other specified depressive episodes: Secondary | ICD-10-CM | POA: Insufficient documentation

## 2012-03-28 DIAGNOSIS — Y929 Unspecified place or not applicable: Secondary | ICD-10-CM | POA: Insufficient documentation

## 2012-03-28 DIAGNOSIS — S0003XA Contusion of scalp, initial encounter: Secondary | ICD-10-CM | POA: Insufficient documentation

## 2012-03-28 DIAGNOSIS — Z7982 Long term (current) use of aspirin: Secondary | ICD-10-CM | POA: Insufficient documentation

## 2012-03-28 DIAGNOSIS — IMO0002 Reserved for concepts with insufficient information to code with codable children: Secondary | ICD-10-CM | POA: Insufficient documentation

## 2012-03-28 DIAGNOSIS — Y9389 Activity, other specified: Secondary | ICD-10-CM | POA: Insufficient documentation

## 2012-03-28 DIAGNOSIS — R55 Syncope and collapse: Secondary | ICD-10-CM

## 2012-03-28 DIAGNOSIS — Z872 Personal history of diseases of the skin and subcutaneous tissue: Secondary | ICD-10-CM | POA: Insufficient documentation

## 2012-03-28 DIAGNOSIS — E785 Hyperlipidemia, unspecified: Secondary | ICD-10-CM | POA: Insufficient documentation

## 2012-03-28 DIAGNOSIS — Z79899 Other long term (current) drug therapy: Secondary | ICD-10-CM | POA: Insufficient documentation

## 2012-03-28 LAB — BASIC METABOLIC PANEL
Chloride: 98 mEq/L (ref 96–112)
GFR calc Af Amer: >90 mL/min (ref >90)
GFR calc non Af Amer: >90 mL/min (ref >90)
Glucose, Bld: 113 mg/dL — ABNORMAL HIGH (ref 70–99)
Potassium: 3.8 mEq/L (ref 3.5–5.1)
Sodium: 137 mEq/L (ref 135–145)

## 2012-03-28 LAB — POCT I-STAT, CHEM 8
BUN: 16 mg/dL (ref 6–23)
Chloride: 102 mEq/L (ref 96–112)
HCT: 46 % (ref 39.0–52.0)
Potassium: 3.9 mEq/L (ref 3.5–5.1)

## 2012-03-28 LAB — CBC
Hemoglobin: 16 g/dL (ref 13.0–17.0)
MCHC: 36.1 g/dL — ABNORMAL HIGH (ref 30.0–36.0)
RDW: 13.3 % (ref 11.5–15.5)
WBC: 10.1 10*3/uL (ref 4.0–10.5)

## 2012-03-28 NOTE — ED Notes (Signed)
Pt denies dizziness or nausea at the moment. Does complain of a slight headache.

## 2012-03-28 NOTE — ED Provider Notes (Signed)
History     CSN: [id number]  Arrival date & time 03/28/12  1402   First MD Initiated Contact with Patient 03/28/12 1535      Chief Complaint  Patient presents with  . Loss of Consciousness  . Head Injury    (Consider location/radiation/quality/duration/timing/severity/associated sxs/prior treatment) Patient is a 56 y.o. male presenting with syncope and head injury. The history is provided by the patient.  Loss of Consciousness This is a new problem. The current episode started today. Associated symptoms include nausea. Pertinent negatives include no abdominal pain, chest pain, fever, neck pain or vomiting. Associated symptoms comments: He reports he swallowed some water the wrong way and was choking. The next thing he knew he woke up on the floor with a bruise on his head and mild upper back pain. No chest pain, abdominal injury or neck pain. He was able to report LOC lasted less than a minute only. He had mild nausea afterward that has resolved. .  Head Injury  Pertinent negatives include no vomiting.    Past Medical History  Diagnosis Date  . Cough   . Cramp of limb     legs  . Acute sinusitis, unspecified   . Tobacco use disorder   . Corns and callosities     toe  . Impacted cerumen   . Other and unspecified hyperlipidemia   . Depression   . Routine general medical examination at a health care facility   . Allergic rhinitis   . Anxiety state, unspecified   . Unspecified essential hypertension   . Type II or unspecified type diabetes mellitus without mention of complication, not stated as uncontrolled     type II  . Impotence of organic origin   . Lumbago     History reviewed. No pertinent past surgical history.  Family History  Problem Relation Age of Onset  . Coronary artery disease      male<60 and male<50  1st degree relative  . Cancer Mother     colon  . Hypertension Father     History  Substance Use Topics  . Smoking status: Current Every Day  Smoker -- 1.0 packs/day for 0 years    Types: Cigarettes  . Smokeless tobacco: Not on file     Comment: started at age 18.  Marland Kitchen Alcohol Use: No      Review of Systems  Constitutional: Negative for fever.  HENT: Negative for neck pain.   Eyes: Negative for visual disturbance.  Respiratory: Negative for shortness of breath.   Cardiovascular: Positive for syncope. Negative for chest pain.  Gastrointestinal: Positive for nausea. Negative for vomiting and abdominal pain.  Musculoskeletal: Positive for back pain.  Neurological: Positive for syncope.    Allergies  Fexofenadine; Hydrochlorothiazide; Loratadine; Penicillins; Pravastatin sodium; and Sudafed  Home Medications   Current Outpatient Rx  Name  Route  Sig  Dispense  Refill  . ASPIRIN 81 MG PO TBEC   Oral   Take 81 mg by mouth daily.           . ATENOLOL-CHLORTHALIDONE 100-25 MG PO TABS   Oral   Take 1 tablet by mouth daily.   90 tablet   3   . VITAMIN D3 1000 UNITS PO TABS   Oral   Take 1,000 Units by mouth daily.           Marland Kitchen FISH OIL OIL   Does not apply   by Does not apply route 2 (two) times daily.           Marland Kitchen  GLIMEPIRIDE 2 MG PO TABS   Oral   Take 1 tablet (2 mg total) by mouth daily before breakfast.   90 tablet   3   . LOVASTATIN 20 MG PO TABS   Oral   Take 20 mg by mouth at bedtime.         Marland Kitchen METFORMIN HCL 1000 MG PO TABS   Oral   Take 1 tablet (1,000 mg total) by mouth 2 (two) times daily with a meal.   180 tablet   3   . GLUCOSAMINE CHONDROITIN VIT D3 PO CAPS   Oral   Take 2 each by mouth daily.           Marland Kitchen PAROXETINE HCL 40 MG PO TABS   Oral   Take 40 mg by mouth every morning.         . QUINAPRIL HCL 20 MG PO TABS   Oral   Take 20 mg by mouth daily.         Marland Kitchen ROPINIROLE HCL 0.5 MG PO TABS   Oral   Take 1-2 tablets (0.5-1 mg total) by mouth at bedtime. Restless legs   60 tablet   5   . SILDENAFIL CITRATE 100 MG PO TABS   Oral   Take 1 tablet (100 mg total) by  mouth daily as needed.   10 tablet   11     BP 139/90  Pulse 66  Temp 98.2 F (36.8 C) (Oral)  Resp 18  SpO2 99%  Physical Exam  Constitutional: He is oriented to person, place, and time. He appears well-developed and well-nourished.  HENT:  Head: Normocephalic and atraumatic.       Moderate hematoma left parietal scalp.   Eyes: EOM are normal. Pupils are equal, round, and reactive to light.  Neck: Normal range of motion.  Cardiovascular: Normal rate and regular rhythm.   No murmur heard. Pulmonary/Chest: Effort normal and breath sounds normal. He has no wheezes. He has no rales.  Abdominal: Soft. There is no tenderness.  Musculoskeletal: Normal range of motion. He exhibits no edema.       Left, very mild tenderness lateral thoracic back. No bruising, swelling. No rib tenderness laterally. No neck tenderness.   Neurological: He is alert and oriented to person, place, and time. He has normal strength and normal reflexes. He displays normal reflexes. No sensory deficit. He displays a negative Romberg sign. Coordination normal.       Ambulatory without ataxia  Skin: Skin is warm and dry.  Psychiatric: He has a normal mood and affect.    ED Course  Procedures (including critical care time)  Labs Reviewed  CBC - Abnormal; Notable for the following:    MCHC 36.1 (*)     All other components within normal limits  BASIC METABOLIC PANEL - Abnormal; Notable for the following:    Glucose, Bld 113 (*)     All other components within normal limits  POCT I-STAT, CHEM 8 - Abnormal; Notable for the following:    Glucose, Bld 113 (*)     All other components within normal limits   Ct Head Wo Contrast  03/28/2012  *RADIOLOGY REPORT*  Clinical Data: Loss of consciousness, head injury.  CT HEAD WITHOUT CONTRAST  Technique:  Contiguous axial images were obtained from the base of the skull through the vertex without contrast.  Comparison: None.  Findings: No mass effect or midline shift is  noted.  Bony calvarium is intact.  Ventricular size  is within normal limits.  There is no evidence of mass lesion, hemorrhage or infarction.  IMPRESSION: No gross intracranial abnormality seen.   Original Report Authenticated By: Lupita Raider.,  M.D.     Date: 03/28/2012  Rate: 68  Rhythm: normal sinus rhythm  QRS Axis: normal  Intervals: normal  ST/T Wave abnormalities: normal  Conduction Disutrbances:none  Narrative Interpretation:   Old EKG Reviewed: none available    No diagnosis found.  1. Syncope 2. Contusion head   MDM  He continues to be comfortable on re-evaluation. Ambulated the patient again and he denies dizziness, headache, nausea or weakness. Head CT negative. VSS - can be discharged home.         Rodena Medin, PA-C 03/28/12 [phone]

## 2012-03-28 NOTE — ED Notes (Signed)
MD at bedside. 

## 2012-03-28 NOTE — ED Notes (Signed)
Pt states he was drinking a glass of water at work when he started to choke. Pt states he was coughing trying to clear his airway when he blacked out and fell backwards, hitting his head on the concrete floor.  Pt now c/o pain to back of head and back pain.  Pt vomited once approx 10 min after fall.  Incident happened around 11am today.

## 2012-03-29 NOTE — ED Provider Notes (Signed)
Medical screening examination/treatment/procedure(s) were performed by non-physician practitioner and as supervising physician I was immediately available for consultation/collaboration.  Markayla Reichart L Denece Shearer, MD 03/29/12 0014 

## 2012-04-01 ENCOUNTER — Other Ambulatory Visit (INDEPENDENT_AMBULATORY_CARE_PROVIDER_SITE_OTHER): Payer: PRIVATE HEALTH INSURANCE

## 2012-04-01 DIAGNOSIS — E785 Hyperlipidemia, unspecified: Secondary | ICD-10-CM

## 2012-04-01 DIAGNOSIS — E119 Type 2 diabetes mellitus without complications: Secondary | ICD-10-CM

## 2012-04-01 DIAGNOSIS — F17200 Nicotine dependence, unspecified, uncomplicated: Secondary | ICD-10-CM

## 2012-04-01 DIAGNOSIS — Z0000 Encounter for general adult medical examination without abnormal findings: Secondary | ICD-10-CM

## 2012-04-01 LAB — HEMOGLOBIN A1C: Hgb A1c MFr Bld: 7.2 % — ABNORMAL HIGH (ref 4.6–6.5)

## 2012-04-01 LAB — BASIC METABOLIC PANEL
Chloride: 97 mEq/L (ref 96–112)
Potassium: 3.3 mEq/L — ABNORMAL LOW (ref 3.5–5.1)

## 2012-04-02 ENCOUNTER — Telehealth: Payer: Self-pay | Admitting: Internal Medicine

## 2012-04-02 MED ORDER — POTASSIUM CHLORIDE CRYS ER 20 MEQ PO TBCR: 20.0000 meq | EXTENDED_RELEASE_TABLET | Freq: Every day | ORAL | Status: DC

## 2012-04-02 NOTE — Telephone Encounter (Signed)
Misty Stanley, please, inform patient that all labs are normal except for a low K Start Klor con  Keep ROV Thx

## 2012-04-02 NOTE — Telephone Encounter (Signed)
Pt informed

## 2012-04-03 ENCOUNTER — Encounter: Payer: Self-pay | Admitting: Internal Medicine

## 2012-04-03 ENCOUNTER — Ambulatory Visit (INDEPENDENT_AMBULATORY_CARE_PROVIDER_SITE_OTHER): Payer: PRIVATE HEALTH INSURANCE | Admitting: Internal Medicine

## 2012-04-03 VITALS — BP 118/90 | HR 76 | Temp 99.0°F | Resp 16 | Wt 198.0 lb

## 2012-04-03 DIAGNOSIS — E876 Hypokalemia: Secondary | ICD-10-CM | POA: Insufficient documentation

## 2012-04-03 DIAGNOSIS — R55 Syncope and collapse: Secondary | ICD-10-CM | POA: Insufficient documentation

## 2012-04-03 DIAGNOSIS — I10 Essential (primary) hypertension: Secondary | ICD-10-CM

## 2012-04-03 DIAGNOSIS — E119 Type 2 diabetes mellitus without complications: Secondary | ICD-10-CM

## 2012-04-03 DIAGNOSIS — S20219A Contusion of unspecified front wall of thorax, initial encounter: Secondary | ICD-10-CM | POA: Insufficient documentation

## 2012-04-03 DIAGNOSIS — Z23 Encounter for immunization: Secondary | ICD-10-CM

## 2012-04-03 DIAGNOSIS — F411 Generalized anxiety disorder: Secondary | ICD-10-CM

## 2012-04-03 DIAGNOSIS — F329 Major depressive disorder, single episode, unspecified: Secondary | ICD-10-CM

## 2012-04-03 MED ORDER — GLIMEPIRIDE 2 MG PO TABS: 2.0000 mg | ORAL_TABLET | Freq: Every day | ORAL | Status: DC

## 2012-04-03 NOTE — Assessment & Plan Note (Signed)
Call if not better 

## 2012-04-03 NOTE — Assessment & Plan Note (Signed)
03/28/12 due to choking on water Head CT was ok

## 2012-04-03 NOTE — Assessment & Plan Note (Signed)
Continue with current prescription therapy as reflected on the Med list.  

## 2012-04-03 NOTE — Progress Notes (Signed)
Subjective:     HPI /o passing out episode on 11/21 - he choked on water - s/p ER evalThe patient presents for a follow-up of  chronic hypertension, chronic dyslipidemia, type 2 diabetes controlled with medicines. BP is nl - he ran out of atenolol last night F/u on restless legs - better  F/u on bad pain in L hip - much better - resolved  BP Readings from Last 3 Encounters:  04/03/12 118/90  03/28/12 131/88  11/27/11 152/100   Wt Readings from Last 3 Encounters:  04/03/12 198 lb (89.812 kg)  11/27/11 198 lb (89.812 kg)  07/26/11 204 lb (92.534 kg)       Review of Systems  Constitutional: Negative for appetite change, fatigue and unexpected weight change.  HENT: Negative for nosebleeds, congestion, sore throat, sneezing, trouble swallowing and neck pain.   Eyes: Negative for itching and visual disturbance.  Respiratory: Negative for cough.   Cardiovascular: Negative for chest pain, palpitations and leg swelling.  Gastrointestinal: Negative for nausea, diarrhea, blood in stool and abdominal distention.  Genitourinary: Negative for frequency and hematuria.  Musculoskeletal: Negative for back pain, joint swelling and gait problem.       L hip pain  Skin: Negative for rash.  Neurological: Negative for dizziness, tremors, speech difficulty and weakness.  Psychiatric/Behavioral: Negative for sleep disturbance, dysphoric mood and agitation. The patient is not nervous/anxious.        Objective:   Physical Exam  Constitutional: He is oriented to person, place, and time. He appears well-developed.  HENT:  Mouth/Throat: Oropharynx is clear and moist.  Eyes: Conjunctivae normal are normal. Pupils are equal, round, and reactive to light.  Neck: Normal range of motion. No JVD present. No thyromegaly present.  Cardiovascular: Normal rate, regular rhythm, normal heart sounds and intact distal pulses.  Exam reveals no gallop and no friction rub.   No murmur heard. Pulmonary/Chest:  Effort normal and breath sounds normal. No respiratory distress. He has no wheezes. He has no rales. He exhibits no tenderness.  Abdominal: Soft. Bowel sounds are normal. He exhibits no distension and no mass. There is no tenderness. There is no rebound and no guarding.  Genitourinary: Rectum normal, prostate normal and penis normal. Guaiac negative stool. No penile tenderness.  Musculoskeletal: Normal range of motion. He exhibits no edema and no tenderness.       L hip is NT w/palp or ROM Str leg elev is neg B  Lymphadenopathy:    He has no cervical adenopathy.  Neurological: He is alert and oriented to person, place, and time. He has normal reflexes. No cranial nerve deficit. He exhibits normal muscle tone. Coordination normal.  Skin: Skin is warm and dry. No rash noted.  Psychiatric: He has a normal mood and affect. His behavior is normal. Judgment and thought content normal.  L poster rib cage is a little tender  Lab Results  Component Value Date   WBC 10.1 03/28/2012   HGB 15.6 03/28/2012   HCT 46.0 03/28/2012   PLT 200 03/28/2012   GLUCOSE 146* 04/01/2012   CHOL 117 11/23/2011   TRIG 85.0 11/23/2011   HDL 35.10* 11/23/2011   LDLDIRECT 73.2 07/19/2010   LDLCALC 65 11/23/2011   ALT 33 11/23/2011   AST 27 11/23/2011   NA 135 04/01/2012   K 3.3* 04/01/2012   CL 97 04/01/2012   CREATININE 0.8 04/01/2012   BUN 23 04/01/2012   CO2 29 04/01/2012   TSH 1.12 11/23/2011   PSA  0.67 11/23/2011   HGBA1C 7.2* 04/01/2012         Assessment & Plan:

## 2012-06-21 ENCOUNTER — Other Ambulatory Visit: Payer: Self-pay | Admitting: Internal Medicine

## 2012-07-05 ENCOUNTER — Encounter: Payer: Self-pay | Admitting: Gastroenterology

## 2012-07-22 ENCOUNTER — Encounter: Payer: Self-pay | Admitting: Gastroenterology

## 2012-07-30 ENCOUNTER — Other Ambulatory Visit (INDEPENDENT_AMBULATORY_CARE_PROVIDER_SITE_OTHER): Payer: PRIVATE HEALTH INSURANCE

## 2012-07-30 DIAGNOSIS — R55 Syncope and collapse: Secondary | ICD-10-CM

## 2012-07-30 DIAGNOSIS — E119 Type 2 diabetes mellitus without complications: Secondary | ICD-10-CM

## 2012-07-30 DIAGNOSIS — S20219A Contusion of unspecified front wall of thorax, initial encounter: Secondary | ICD-10-CM

## 2012-07-30 DIAGNOSIS — F411 Generalized anxiety disorder: Secondary | ICD-10-CM

## 2012-07-30 DIAGNOSIS — I10 Essential (primary) hypertension: Secondary | ICD-10-CM

## 2012-07-30 DIAGNOSIS — E876 Hypokalemia: Secondary | ICD-10-CM

## 2012-07-30 DIAGNOSIS — F329 Major depressive disorder, single episode, unspecified: Secondary | ICD-10-CM

## 2012-07-30 DIAGNOSIS — Z23 Encounter for immunization: Secondary | ICD-10-CM

## 2012-07-30 LAB — BASIC METABOLIC PANEL
CO2: 28 mEq/L (ref 19–32)
Chloride: 101 mEq/L (ref 96–112)
Glucose, Bld: 151 mg/dL — ABNORMAL HIGH (ref 70–99)
Potassium: 4.2 mEq/L (ref 3.5–5.1)
Sodium: 137 mEq/L (ref 135–145)

## 2012-08-01 ENCOUNTER — Encounter: Payer: Self-pay | Admitting: Internal Medicine

## 2012-08-01 ENCOUNTER — Ambulatory Visit (INDEPENDENT_AMBULATORY_CARE_PROVIDER_SITE_OTHER): Payer: PRIVATE HEALTH INSURANCE | Admitting: Internal Medicine

## 2012-08-01 VITALS — BP 118/80 | HR 80 | Temp 97.3°F | Resp 16 | Wt 193.0 lb

## 2012-08-01 DIAGNOSIS — E785 Hyperlipidemia, unspecified: Secondary | ICD-10-CM

## 2012-08-01 DIAGNOSIS — I10 Essential (primary) hypertension: Secondary | ICD-10-CM

## 2012-08-01 DIAGNOSIS — E119 Type 2 diabetes mellitus without complications: Secondary | ICD-10-CM

## 2012-08-01 DIAGNOSIS — Z0000 Encounter for general adult medical examination without abnormal findings: Secondary | ICD-10-CM

## 2012-08-01 DIAGNOSIS — F411 Generalized anxiety disorder: Secondary | ICD-10-CM

## 2012-08-01 MED ORDER — OXYCODONE-ACETAMINOPHEN 10-325 MG PO TABS: 1.0000 | ORAL_TABLET | Freq: Two times a day (BID) | ORAL | Status: DC | PRN

## 2012-08-01 NOTE — Assessment & Plan Note (Signed)
Better Continue with current prescription therapy as reflected on the Med list.  

## 2012-08-01 NOTE — Assessment & Plan Note (Signed)
Continue with current prescription therapy as reflected on the Med list.  

## 2012-08-01 NOTE — Progress Notes (Signed)
   Subjective:     HPI  The patient presents for a follow-up of  chronic hypertension, chronic dyslipidemia, type 2 diabetes controlled with medicines. BP is nl - he ran out of atenolol last night F/u on restless legs - better  F/u on bad pain in L hip - much better - resolved  BP Readings from Last 3 Encounters:  08/01/12 118/80  04/03/12 118/90  03/28/12 131/88   Wt Readings from Last 3 Encounters:  08/01/12 193 lb (87.544 kg)  04/03/12 198 lb (89.812 kg)  11/27/11 198 lb (89.812 kg)       Review of Systems  Constitutional: Negative for appetite change, fatigue and unexpected weight change.  HENT: Negative for nosebleeds, congestion, sore throat, sneezing, trouble swallowing and neck pain.   Eyes: Negative for itching and visual disturbance.  Respiratory: Negative for cough.   Cardiovascular: Negative for chest pain, palpitations and leg swelling.  Gastrointestinal: Negative for nausea, diarrhea, blood in stool and abdominal distention.  Genitourinary: Negative for frequency and hematuria.  Musculoskeletal: Negative for back pain, joint swelling and gait problem.       L hip pain  Skin: Negative for rash.  Neurological: Negative for dizziness, tremors, speech difficulty and weakness.  Psychiatric/Behavioral: Negative for sleep disturbance, dysphoric mood and agitation. The patient is not nervous/anxious.        Objective:   Physical Exam  Constitutional: He is oriented to person, place, and time. He appears well-developed.  HENT:  Mouth/Throat: Oropharynx is clear and moist.  Eyes: Conjunctivae are normal. Pupils are equal, round, and reactive to light.  Neck: Normal range of motion. No JVD present. No thyromegaly present.  Cardiovascular: Normal rate, regular rhythm, normal heart sounds and intact distal pulses.  Exam reveals no gallop and no friction rub.   No murmur heard. Pulmonary/Chest: Effort normal and breath sounds normal. No respiratory distress. He has  no wheezes. He has no rales. He exhibits no tenderness.  Abdominal: Soft. Bowel sounds are normal. He exhibits no distension and no mass. There is no tenderness. There is no rebound and no guarding.  Genitourinary: Rectum normal, prostate normal and penis normal. Guaiac negative stool. No penile tenderness.  Musculoskeletal: Normal range of motion. He exhibits no edema and no tenderness.  L hip is NT w/palp or ROM Str leg elev is neg B  Lymphadenopathy:    He has no cervical adenopathy.  Neurological: He is alert and oriented to person, place, and time. He has normal reflexes. No cranial nerve deficit. He exhibits normal muscle tone. Coordination normal.  Skin: Skin is warm and dry. No rash noted.  Psychiatric: He has a normal mood and affect. His behavior is normal. Judgment and thought content normal.    Lab Results  Component Value Date   WBC 10.1 03/28/2012   HGB 15.6 03/28/2012   HCT 46.0 03/28/2012   PLT 200 03/28/2012   GLUCOSE 151* 07/30/2012   CHOL 117 11/23/2011   TRIG 85.0 11/23/2011   HDL 35.10* 11/23/2011   LDLDIRECT 73.2 07/19/2010   LDLCALC 65 11/23/2011   ALT 33 11/23/2011   AST 27 11/23/2011   NA 137 07/30/2012   K 4.2 07/30/2012   CL 101 07/30/2012   CREATININE 0.9 07/30/2012   BUN 23 07/30/2012   CO2 28 07/30/2012   TSH 1.12 11/23/2011   PSA 0.67 11/23/2011   HGBA1C 6.6* 07/30/2012         Assessment & Plan:

## 2012-08-05 ENCOUNTER — Other Ambulatory Visit: Payer: Self-pay | Admitting: Internal Medicine

## 2012-08-06 ENCOUNTER — Ambulatory Visit (AMBULATORY_SURGERY_CENTER): Payer: PRIVATE HEALTH INSURANCE | Admitting: *Deleted

## 2012-08-06 VITALS — Ht 72.0 in | Wt 195.0 lb

## 2012-08-06 DIAGNOSIS — Z800 Family history of malignant neoplasm of digestive organs: Secondary | ICD-10-CM

## 2012-08-06 DIAGNOSIS — Z1211 Encounter for screening for malignant neoplasm of colon: Secondary | ICD-10-CM

## 2012-08-06 MED ORDER — MOVIPREP 100 G PO SOLR: ORAL | Status: DC

## 2012-08-09 ENCOUNTER — Encounter: Payer: Self-pay | Admitting: Gastroenterology

## 2012-08-20 ENCOUNTER — Other Ambulatory Visit: Payer: Self-pay | Admitting: Gastroenterology

## 2012-08-20 ENCOUNTER — Encounter: Payer: Self-pay | Admitting: Gastroenterology

## 2012-08-20 ENCOUNTER — Ambulatory Visit (AMBULATORY_SURGERY_CENTER): Payer: PRIVATE HEALTH INSURANCE | Admitting: Gastroenterology

## 2012-08-20 VITALS — BP 111/74 | HR 57 | Temp 97.1°F | Resp 19 | Ht 72.0 in | Wt 195.0 lb

## 2012-08-20 DIAGNOSIS — D126 Benign neoplasm of colon, unspecified: Secondary | ICD-10-CM

## 2012-08-20 DIAGNOSIS — Z1211 Encounter for screening for malignant neoplasm of colon: Secondary | ICD-10-CM

## 2012-08-20 DIAGNOSIS — Z800 Family history of malignant neoplasm of digestive organs: Secondary | ICD-10-CM

## 2012-08-20 DIAGNOSIS — Z86010 Personal history of colonic polyps: Secondary | ICD-10-CM

## 2012-08-20 MED ORDER — SODIUM CHLORIDE 0.9 % IV SOLN: 500.0000 mL | INTRAVENOUS | Status: DC

## 2012-08-20 NOTE — Progress Notes (Signed)
Patient did not experience any of the following events: a burn prior to discharge; a fall within the facility; wrong site/side/patient/procedure/implant event; or a hospital transfer or hospital admission upon discharge from the facility. (G8907) Patient did not have preoperative order for IV antibiotic SSI prophylaxis. (G8918)  

## 2012-08-20 NOTE — Op Note (Signed)
[facility] [address] [place], [address]   COLONOSCOPY PROCEDURE REPORT  PATIENT: Aaron Weaver, Aaron Weaver  MR#: [id number] BIRTHDATE: 07/24/1955 , 56  yrs. old GENDER: Male ENDOSCOPIST: Meryl Dare, MD, [facility] PROCEDURE DATE:  08/20/2012 PROCEDURE:   Colonoscopy with snare polypectomy ASA CLASS:   Class II INDICATIONS:Patient's personal history of adenomatous colon polyps and patient's immediate family history of colon cancer. MEDICATIONS: MAC sedation, administered by CRNA and propofol (Diprivan) 200mg  IV DESCRIPTION OF PROCEDURE:   After the risks benefits and alternatives of the procedure were thoroughly explained, informed consent was obtained.  A digital rectal exam revealed no abnormalities of the rectum.   The LB [id number]  endoscope was introduced through the anus and advanced to the cecum, which was identified by both the appendix and ileocecal valve. No adverse events experienced.   The quality of the prep was good, using MoviPrep  The instrument was then slowly withdrawn as the colon was fully examined.  COLON FINDINGS: A sessile polyp measuring 7 mm in size was found at the hepatic flexure.  A polypectomy was performed with a cold snare.  The resection was complete and the polyp tissue was completely retrieved.   A sessile polyp measuring 5 mm in size was found in the transverse colon.  A polypectomy was performed with a cold snare.  The resection was complete and the polyp tissue was completely retrieved.   The colon was otherwise normal.  There was no diverticulosis, inflammation, polyps or cancers unless previously stated.  Retroflexed views revealed small internal hemorrhoids. The time to cecum=1 minutes 03 seconds.  Withdrawal time=10 minutes 04 seconds.  The scope was withdrawn and the procedure completed.  COMPLICATIONS: There were no complications.  ENDOSCOPIC IMPRESSION: 1.   Sessile polyp measuring 7 mm at the hepatic  flexure; polypectomy performed with a cold snare 2.   Sessile polyp measuring 5 mm in the transverse colon; polypectomy performed with a cold snare 3.   Small internal hemorrhoids  RECOMMENDATIONS: 1.  Await pathology results 2.  Repeat Colonoscopy in 5 years.   eSigned:  Meryl Dare, MD, [facility] 08/20/2012 10:06 AM

## 2012-08-20 NOTE — Patient Instructions (Signed)
YOU HAD AN ENDOSCOPIC PROCEDURE TODAY AT THE [facility]: Refer to the procedure report that was given to you for any specific questions about what was found during the examination.  If the procedure report does not answer your questions, please call your gastroenterologist to clarify.  If you requested that your care partner not be given the details of your procedure findings, then the procedure report has been included in a sealed envelope for you to review at your convenience later.  YOU SHOULD EXPECT: Some feelings of bloating in the abdomen. Passage of more gas than usual.  Walking can help get rid of the air that was put into your GI tract during the procedure and reduce the bloating. If you had a lower endoscopy (such as a colonoscopy or flexible sigmoidoscopy) you may notice spotting of blood in your stool or on the toilet paper. If you underwent a bowel prep for your procedure, then you may not have a normal bowel movement for a few days.  DIET: Your first meal following the procedure should be a light meal and then it is ok to progress to your normal diet.  A half-sandwich or bowl of soup is an example of a good first meal.  Heavy or fried foods are harder to digest and may make you feel nauseous or bloated.  Likewise meals heavy in dairy and vegetables can cause extra gas to form and this can also increase the bloating.  Drink plenty of fluids but you should avoid alcoholic beverages for 24 hours.  ACTIVITY: Your care partner should take you home directly after the procedure.  You should plan to take it easy, moving slowly for the rest of the day.  You can resume normal activity the day after the procedure however you should NOT DRIVE or use heavy machinery for 24 hours (because of the sedation medicines used during the test).    SYMPTOMS TO REPORT IMMEDIATELY: A gastroenterologist can be reached at any hour.  During normal business hours, 8:30 AM to 5:00 PM Monday through Friday,  call [phone].  After hours and on weekends, please call the GI answering service at [phone] who will take a message and have the physician on call contact you.   Following lower endoscopy (colonoscopy or flexible sigmoidoscopy):  Excessive amounts of blood in the stool  Significant tenderness or worsening of abdominal pains  Swelling of the abdomen that is new, acute  Fever of 100F or higher  Following upper endoscopy (EGD)  Vomiting of blood or coffee ground material  New chest pain or pain under the shoulder blades  Painful or persistently difficult swallowing  New shortness of breath  Fever of 100F or higher  Black, tarry-looking stools  FOLLOW UP: If any biopsies were taken you will be contacted by phone or by letter within the next 1-3 weeks.  Call your gastroenterologist if you have not heard about the biopsies in 3 weeks.  Our staff will call the home number listed on your records the next business day following your procedure to check on you and address any questions or concerns that you may have at that time regarding the information given to you following your procedure. This is a courtesy call and so if there is no answer at the home number and we have not heard from you through the emergency physician on call, we will assume that you have returned to your regular daily activities without incident.  SIGNATURES/CONFIDENTIALITY: You and/or your care   partner have signed paperwork which will be entered into your electronic medical record.  These signatures attest to the fact that that the information above on your After Visit Summary has been reviewed and is understood.  Full responsibility of the confidentiality of this discharge information lies with you and/or your care-partner.YOU HAD AN ENDOSCOPIC PROCEDURE TODAY AT THE [facility]: Refer to the procedure report that was given to you for any specific questions about what was found during the examination.   If the procedure report does not answer your questions, please call your gastroenterologist to clarify.  If you requested that your care partner not be given the details of your procedure findings, then the procedure report has been included in a sealed envelope for you to review at your convenience later.  YOU SHOULD EXPECT: Some feelings of bloating in the abdomen. Passage of more gas than usual.  Walking can help get rid of the air that was put into your GI tract during the procedure and reduce the bloating. If you had a lower endoscopy (such as a colonoscopy or flexible sigmoidoscopy) you may notice spotting of blood in your stool or on the toilet paper. If you underwent a bowel prep for your procedure, then you may not have a normal bowel movement for a few days.  DIET: Your first meal following the procedure should be a light meal and then it is ok to progress to your normal diet.  A half-sandwich or bowl of soup is an example of a good first meal.  Heavy or fried foods are harder to digest and may make you feel nauseous or bloated.  Likewise meals heavy in dairy and vegetables can cause extra gas to form and this can also increase the bloating.  Drink plenty of fluids but you should avoid alcoholic beverages for 24 hours.  ACTIVITY: Your care partner should take you home directly after the procedure.  You should plan to take it easy, moving slowly for the rest of the day.  You can resume normal activity the day after the procedure however you should NOT DRIVE or use heavy machinery for 24 hours (because of the sedation medicines used during the test).    SYMPTOMS TO REPORT IMMEDIATELY: A gastroenterologist can be reached at any hour.  During normal business hours, 8:30 AM to 5:00 PM Monday through Friday, call [phone].  After hours and on weekends, please call the GI answering service at [phone] who will take a message and have the physician on call contact you.   Following lower  endoscopy (colonoscopy or flexible sigmoidoscopy):  Excessive amounts of blood in the stool  Significant tenderness or worsening of abdominal pains  Swelling of the abdomen that is new, acute  Fever of 100F or higher  FOLLOW UP: If any biopsies were taken you will be contacted by phone or by letter within the next 1-3 weeks.  Call your gastroenterologist if you have not heard about the biopsies in 3 weeks.  Our staff will call the home number listed on your records the next business day following your procedure to check on you and address any questions or concerns that you may have at that time regarding the information given to you following your procedure. This is a courtesy call and so if there is no answer at the home number and we have not heard from you through the emergency physician on call, we will assume that you have returned to your regular daily activities   without incident.  SIGNATURES/CONFIDENTIALITY: You and/or your care partner have signed paperwork which will be entered into your electronic medical record.  These signatures attest to the fact that that the information above on your After Visit Summary has been reviewed and is understood.  Full responsibility of the confidentiality of this discharge information lies with you and/or your care-partner.       

## 2012-08-20 NOTE — Progress Notes (Signed)
Called to room to assist during endoscopic procedure.  Patient ID and intended procedure confirmed with present staff. Received instructions for my participation in the procedure from the performing physician. [email or web address] 

## 2012-08-21 ENCOUNTER — Telehealth: Payer: Self-pay | Admitting: *Deleted

## 2012-08-21 NOTE — Telephone Encounter (Signed)
  Follow up Call-  Call back number 08/20/2012  Post procedure Call Back phone  # [phone]  Permission to leave phone message Yes     Patient questions:  Do you have a fever, pain , or abdominal swelling? no Pain Score  0 *  Have you tolerated food without any problems? yes  Have you been able to return to your normal activities? yes  Do you have any questions about your discharge instructions: Diet   no Medications  no Follow up visit  no  Do you have questions or concerns about your Care? no  Actions: * If pain score is 4 or above: No action needed, pain <4.

## 2012-08-27 ENCOUNTER — Encounter: Payer: Self-pay | Admitting: Gastroenterology

## 2012-10-09 ENCOUNTER — Other Ambulatory Visit: Payer: Self-pay | Admitting: Internal Medicine

## 2012-10-22 ENCOUNTER — Telehealth: Payer: Self-pay

## 2012-10-22 NOTE — Telephone Encounter (Signed)
Pt wife called [facility] requesting xray results from 2010. Wife notified Mild bronchitic changes. No acute infiltrates.

## 2012-10-29 ENCOUNTER — Other Ambulatory Visit: Payer: Self-pay | Admitting: Family Medicine

## 2012-10-29 ENCOUNTER — Ambulatory Visit
Admission: RE | Admit: 2012-10-29 | Discharge: 2012-10-29 | Disposition: A | Discharge status: Home / Self Care | Payer: PRIVATE HEALTH INSURANCE | Source: Ambulatory Visit | Attending: Family Medicine | Admitting: Family Medicine

## 2012-10-29 DIAGNOSIS — R05 Cough: Secondary | ICD-10-CM

## 2012-10-29 DIAGNOSIS — R059 Cough, unspecified: Secondary | ICD-10-CM

## 2012-12-12 ENCOUNTER — Other Ambulatory Visit: Payer: Self-pay | Admitting: Internal Medicine

## 2012-12-16 ENCOUNTER — Other Ambulatory Visit: Payer: Self-pay | Admitting: Internal Medicine

## 2012-12-31 ENCOUNTER — Encounter: Payer: Self-pay | Admitting: Internal Medicine

## 2013-01-13 ENCOUNTER — Other Ambulatory Visit: Payer: Self-pay | Admitting: Internal Medicine

## 2013-02-13 ENCOUNTER — Other Ambulatory Visit: Payer: Self-pay | Admitting: Internal Medicine

## 2013-03-13 ENCOUNTER — Other Ambulatory Visit: Payer: Self-pay

## 2016-12-26 ENCOUNTER — Other Ambulatory Visit: Payer: Self-pay | Admitting: Family Medicine

## 2016-12-26 DIAGNOSIS — F17200 Nicotine dependence, unspecified, uncomplicated: Secondary | ICD-10-CM

## 2017-01-02 ENCOUNTER — Ambulatory Visit
Admission: RE | Admit: 2017-01-02 | Discharge: 2017-01-02 | Disposition: A | Discharge status: Home / Self Care | Payer: BLUE CROSS/BLUE SHIELD | Source: Ambulatory Visit | Attending: Family Medicine | Admitting: Family Medicine

## 2017-01-02 DIAGNOSIS — F17200 Nicotine dependence, unspecified, uncomplicated: Secondary | ICD-10-CM

## 2017-08-06 ENCOUNTER — Encounter: Payer: Self-pay | Admitting: Gastroenterology

## 2017-08-10 ENCOUNTER — Encounter: Payer: Self-pay | Admitting: Gastroenterology

## 2017-09-28 ENCOUNTER — Other Ambulatory Visit: Payer: Self-pay

## 2017-09-28 ENCOUNTER — Ambulatory Visit (AMBULATORY_SURGERY_CENTER): Payer: Self-pay | Admitting: *Deleted

## 2017-09-28 VITALS — Ht 72.0 in | Wt 194.0 lb

## 2017-09-28 DIAGNOSIS — Z800 Family history of malignant neoplasm of digestive organs: Secondary | ICD-10-CM

## 2017-09-28 DIAGNOSIS — Z86010 Personal history of colonic polyps: Secondary | ICD-10-CM

## 2017-09-28 MED ORDER — NA SULFATE-K SULFATE-MG SULF 17.5-3.13-1.6 GM/177ML PO SOLN: 1.0000 | Freq: Once | ORAL | 0 refills | Status: AC

## 2017-09-28 NOTE — Progress Notes (Signed)
No egg or soy allergy known to patient  No issues with past sedation with any surgeries  or procedures, no intubation problems  No diet pills per patient No home 02 use per patient  No blood thinners per patient  Pt denies issues with constipation  No A fib or A flutter  EMMI video sent to pt's e mail - pt declined  $15 coupon for Suprep to pt today in [place]

## 2017-10-12 ENCOUNTER — Encounter: Payer: Self-pay | Admitting: Gastroenterology

## 2017-10-12 ENCOUNTER — Ambulatory Visit (AMBULATORY_SURGERY_CENTER): Payer: BLUE CROSS/BLUE SHIELD | Admitting: Gastroenterology

## 2017-10-12 ENCOUNTER — Other Ambulatory Visit: Payer: Self-pay

## 2017-10-12 VITALS — BP 99/66 | HR 63 | Temp 96.9°F | Resp 15 | Ht 72.0 in | Wt 194.0 lb

## 2017-10-12 DIAGNOSIS — D123 Benign neoplasm of transverse colon: Secondary | ICD-10-CM | POA: Diagnosis not present

## 2017-10-12 DIAGNOSIS — Z800 Family history of malignant neoplasm of digestive organs: Secondary | ICD-10-CM | POA: Diagnosis not present

## 2017-10-12 DIAGNOSIS — Z86010 Personal history of colonic polyps: Secondary | ICD-10-CM | POA: Diagnosis not present

## 2017-10-12 DIAGNOSIS — D120 Benign neoplasm of cecum: Secondary | ICD-10-CM | POA: Diagnosis not present

## 2017-10-12 MED ORDER — SODIUM CHLORIDE 0.9 % IV SOLN: 500.0000 mL | Freq: Once | INTRAVENOUS | Status: DC

## 2017-10-12 NOTE — Op Note (Signed)
[facility] Patient Name: Javohn Basey Procedure Date: 10/12/2017 10:56 AM MRN: [id number] Endoscopist: Ladene Artist , MD Age: 62 Referring MD:  Date of Birth: 08-Aug-1955 Gender: Male Account #: [id number] Procedure:                Colonoscopy Indications:              Surveillance: Personal history of adenomatous                            polyps on last colonoscopy 5 years ago. Family                            history of colon cancer. Medicines:                Monitored Anesthesia Care Procedure:                Pre-Anesthesia Assessment:                           - Prior to the procedure, a History and Physical                            was performed, and patient medications and                            allergies were reviewed. The patient's tolerance of                            previous anesthesia was also reviewed. The risks                            and benefits of the procedure and the sedation                            options and risks were discussed with the patient.                            All questions were answered, and informed consent                            was obtained. Prior Anticoagulants: The patient has                            taken no previous anticoagulant or antiplatelet                            agents. ASA Grade Assessment: II - A patient with                            mild systemic disease. After reviewing the risks                            and benefits, the patient was deemed in  satisfactory condition to undergo the procedure.                           After obtaining informed consent, the colonoscope                            was passed under direct vision. Throughout the                            procedure, the patient's blood pressure, pulse, and                            oxygen saturations were monitored continuously. The                            Colonoscope was introduced through the anus  and                            advanced to the the cecum, identified by                            appendiceal orifice and ileocecal valve. The                            ileocecal valve, appendiceal orifice, and rectum                            were photographed. The quality of the bowel                            preparation was good. The colonoscopy was performed                            without difficulty. The patient tolerated the                            procedure well. Scope In: 10:57:56 AM Scope Out: 11:12:11 AM Scope Withdrawal Time: 0 hours 13 minutes 1 second  Total Procedure Duration: 0 hours 14 minutes 15 seconds  Findings:                 The perianal and digital rectal examinations were                            normal.                           A 12 mm polyp was found in the hepatic flexure. The                            polyp was sessile. The polyp was removed with a hot                            snare. Resection and retrieval were complete.  Four sessile polyps were found in the transverse                            colon (1) and cecum (3). The polyps were 6 to 7 mm                            in size. These polyps were removed with a cold                            snare. Resection and retrieval were complete.                           Internal hemorrhoids were found during                            retroflexion. The hemorrhoids were medium-sized and                            Grade I (internal hemorrhoids that do not prolapse).                           The exam was otherwise without abnormality on                            direct and retroflexion views. Complications:            No immediate complications. Estimated blood loss:                            None. Estimated Blood Loss:     Estimated blood loss: none. Impression:               - One 12 mm polyp at the hepatic flexure, removed                            with a hot snare.  Resected and retrieved.                           - Four 6 to 7 mm polyps in the transverse colon and                            in the cecum, removed with a cold snare. Resected                            and retrieved.                           - Internal hemorrhoids.                           - The examination was otherwise normal on direct                            and retroflexion views. Recommendation:           -  Repeat colonoscopy in 3 - 5 years for                            surveillance pending pathology review.                           - Patient has a contact number available for                            emergencies. The signs and symptoms of potential                            delayed complications were discussed with the                            patient. Return to normal activities tomorrow.                            Written discharge instructions were provided to the                            patient.                           - Resume previous diet.                           - Continue present medications.                           - Await pathology results.                           - No aspirin, ibuprofen, naproxen, or other                            non-steroidal anti-inflammatory drugs for 2 weeks                            after polyp removal. Ladene Artist, MD 10/12/2017 11:16:15 AM This report has been signed electronically.

## 2017-10-12 NOTE — Patient Instructions (Signed)
Please read handouts on hemorrhoids and polyps.  No aspirin, ibuprofen, naproxen, or other non-steridal anti-inflammatory drugs for 2 weeks.     YOU HAD AN ENDOSCOPIC PROCEDURE TODAY AT [facility]:   Refer to the procedure report that was given to you for any specific questions about what was found during the examination.  If the procedure report does not answer your questions, please call your gastroenterologist to clarify.  If you requested that your care partner not be given the details of your procedure findings, then the procedure report has been included in a sealed envelope for you to review at your convenience later.  YOU SHOULD EXPECT: Some feelings of bloating in the abdomen. Passage of more gas than usual.  Walking can help get rid of the air that was put into your GI tract during the procedure and reduce the bloating. If you had a lower endoscopy (such as a colonoscopy or flexible sigmoidoscopy) you may notice spotting of blood in your stool or on the toilet paper. If you underwent a bowel prep for your procedure, you may not have a normal bowel movement for a few days.  Please Note:  You might notice some irritation and congestion in your nose or some drainage.  This is from the oxygen used during your procedure.  There is no need for concern and it should clear up in a day or so.  SYMPTOMS TO REPORT IMMEDIATELY:   Following lower endoscopy (colonoscopy or flexible sigmoidoscopy):  Excessive amounts of blood in the stool  Significant tenderness or worsening of abdominal pains  Swelling of the abdomen that is new, acute  Fever of 100F or higher   For urgent or emergent issues, a gastroenterologist can be reached at any hour by calling [phone].   DIET:  We do recommend a small meal at first, but then you may proceed to your regular diet.  Drink plenty of fluids but you should avoid alcoholic beverages for 24 hours.  ACTIVITY:  You should plan to take  it easy for the rest of today and you should NOT DRIVE or use heavy machinery until tomorrow (because of the sedation medicines used during the test).    FOLLOW UP: Our staff will call the number listed on your records the next business day following your procedure to check on you and address any questions or concerns that you may have regarding the information given to you following your procedure. If we do not reach you, we will leave a message.  However, if you are feeling well and you are not experiencing any problems, there is no need to return our call.  We will assume that you have returned to your regular daily activities without incident.  If any biopsies were taken you will be contacted by phone or by letter within the next 1-3 weeks.  Please call us at [phone] if you have not heard about the biopsies in 3 weeks.    SIGNATURES/CONFIDENTIALITY: You and/or your care partner have signed paperwork which will be entered into your electronic medical record.  These signatures attest to the fact that that the information above on your After Visit Summary has been reviewed and is understood.  Full responsibility of the confidentiality of this discharge information lies with you and/or your care-partner.

## 2017-10-12 NOTE — Progress Notes (Signed)
Report to PACU, RN, vss, BBS= Clear.  

## 2017-10-12 NOTE — Progress Notes (Signed)
Pt's states no medical or surgical changes since previsit or office visit. 

## 2017-10-12 NOTE — Progress Notes (Signed)
Called to room to assist during endoscopic procedure.  Patient ID and intended procedure confirmed with present staff. Received instructions for my participation in the procedure from the performing physician.  

## 2017-10-15 ENCOUNTER — Telehealth: Payer: Self-pay

## 2017-10-15 NOTE — Telephone Encounter (Signed)
  Follow up Call-  Call back number 10/12/2017  Post procedure Call Back phone  # [phone]  Permission to leave phone message Yes  Some recent data might be hidden     Patient questions:  Do you have a fever, pain , or abdominal swelling? No. Pain Score  0 *  Have you tolerated food without any problems? Yes.    Have you been able to return to your normal activities? Yes.    Do you have any questions about your discharge instructions: Diet   No. Medications  No. Follow up visit  No.  Do you have questions or concerns about your Care? No.  Actions: * If pain score is 4 or above: No action needed, pain <4.

## 2017-10-23 ENCOUNTER — Encounter: Payer: Self-pay | Admitting: Gastroenterology

## 2017-10-31 ENCOUNTER — Encounter: Payer: Self-pay | Admitting: Gastroenterology

## 2018-06-14 ENCOUNTER — Encounter (HOSPITAL_COMMUNITY): Payer: Self-pay | Admitting: Emergency Medicine

## 2018-06-14 ENCOUNTER — Observation Stay (HOSPITAL_COMMUNITY)
Admission: EM | Admit: 2018-06-14 | Discharge: 2018-06-15 | Disposition: A | Discharge status: Home / Self Care | Payer: BLUE CROSS/BLUE SHIELD | Attending: Internal Medicine | Admitting: Internal Medicine

## 2018-06-14 ENCOUNTER — Observation Stay (HOSPITAL_COMMUNITY): Payer: BLUE CROSS/BLUE SHIELD

## 2018-06-14 ENCOUNTER — Other Ambulatory Visit: Payer: Self-pay

## 2018-06-14 DIAGNOSIS — E785 Hyperlipidemia, unspecified: Secondary | ICD-10-CM | POA: Diagnosis not present

## 2018-06-14 DIAGNOSIS — R944 Abnormal results of kidney function studies: Secondary | ICD-10-CM | POA: Insufficient documentation

## 2018-06-14 DIAGNOSIS — E860 Dehydration: Secondary | ICD-10-CM | POA: Diagnosis present

## 2018-06-14 DIAGNOSIS — R569 Unspecified convulsions: Secondary | ICD-10-CM

## 2018-06-14 DIAGNOSIS — Z7984 Long term (current) use of oral hypoglycemic drugs: Secondary | ICD-10-CM | POA: Insufficient documentation

## 2018-06-14 DIAGNOSIS — Z7982 Long term (current) use of aspirin: Secondary | ICD-10-CM | POA: Insufficient documentation

## 2018-06-14 DIAGNOSIS — Z79899 Other long term (current) drug therapy: Secondary | ICD-10-CM | POA: Diagnosis not present

## 2018-06-14 DIAGNOSIS — I959 Hypotension, unspecified: Secondary | ICD-10-CM

## 2018-06-14 DIAGNOSIS — I10 Essential (primary) hypertension: Secondary | ICD-10-CM | POA: Insufficient documentation

## 2018-06-14 DIAGNOSIS — J101 Influenza due to other identified influenza virus with other respiratory manifestations: Secondary | ICD-10-CM | POA: Diagnosis not present

## 2018-06-14 DIAGNOSIS — R55 Syncope and collapse: Secondary | ICD-10-CM | POA: Diagnosis not present

## 2018-06-14 DIAGNOSIS — E119 Type 2 diabetes mellitus without complications: Secondary | ICD-10-CM | POA: Diagnosis not present

## 2018-06-14 DIAGNOSIS — R7989 Other specified abnormal findings of blood chemistry: Secondary | ICD-10-CM | POA: Diagnosis not present

## 2018-06-14 DIAGNOSIS — E876 Hypokalemia: Secondary | ICD-10-CM | POA: Insufficient documentation

## 2018-06-14 DIAGNOSIS — F17210 Nicotine dependence, cigarettes, uncomplicated: Secondary | ICD-10-CM | POA: Insufficient documentation

## 2018-06-14 DIAGNOSIS — R197 Diarrhea, unspecified: Secondary | ICD-10-CM | POA: Diagnosis present

## 2018-06-14 LAB — COMPREHENSIVE METABOLIC PANEL
ALT: 60 U/L — ABNORMAL HIGH (ref 0–44)
ANION GAP: 14 (ref 5–15)
AST: 57 U/L — ABNORMAL HIGH (ref 15–41)
Albumin: 4.2 g/dL (ref 3.5–5.0)
Alkaline Phosphatase: 51 U/L (ref 38–126)
BUN: 38 mg/dL — ABNORMAL HIGH (ref 8–23)
CO2: 22 mmol/L (ref 22–32)
Calcium: 9.5 mg/dL (ref 8.9–10.3)
Chloride: 100 mmol/L (ref 98–111)
Creatinine, Ser: 1.81 mg/dL — ABNORMAL HIGH (ref 0.61–1.24)
GFR, EST AFRICAN AMERICAN: 45 mL/min — AB (ref >60)
GFR, EST NON AFRICAN AMERICAN: 39 mL/min — AB (ref >60)
Glucose, Bld: 231 mg/dL — ABNORMAL HIGH (ref 70–99)
Potassium: 4 mmol/L (ref 3.5–5.1)
SODIUM: 136 mmol/L (ref 135–145)
TOTAL PROTEIN: 8 g/dL (ref 6.5–8.1)
Total Bilirubin: 1.1 mg/dL (ref 0.3–1.2)

## 2018-06-14 LAB — RAPID URINE DRUG SCREEN, HOSP PERFORMED
AMPHETAMINES: NOT DETECTED
BARBITURATES: NOT DETECTED
Benzodiazepines: NOT DETECTED
COCAINE: NOT DETECTED
Opiates: NOT DETECTED
TETRAHYDROCANNABINOL: NOT DETECTED

## 2018-06-14 LAB — CBC
HCT: 40.6 % (ref 39.0–52.0)
Hemoglobin: 13.8 g/dL (ref 13.0–17.0)
MCH: 29.8 pg (ref 26.0–34.0)
MCHC: 34 g/dL (ref 30.0–36.0)
MCV: 87.7 fL (ref 80.0–100.0)
Platelets: 118 10*3/uL — ABNORMAL LOW (ref 150–400)
RBC: 4.63 MIL/uL (ref 4.22–5.81)
RDW: 13.1 % (ref 11.5–15.5)
WBC: 5.8 10*3/uL (ref 4.0–10.5)
nRBC: 0 % (ref 0.0–0.2)

## 2018-06-14 LAB — BASIC METABOLIC PANEL
Anion gap: 10 (ref 5–15)
BUN: 39 mg/dL — ABNORMAL HIGH (ref 8–23)
CALCIUM: 8.4 mg/dL — AB (ref 8.9–10.3)
CO2: 21 mmol/L — ABNORMAL LOW (ref 22–32)
Chloride: 105 mmol/L (ref 98–111)
Creatinine, Ser: 1.34 mg/dL — ABNORMAL HIGH (ref 0.61–1.24)
GFR, EST NON AFRICAN AMERICAN: 56 mL/min — AB (ref >60)
Glucose, Bld: 138 mg/dL — ABNORMAL HIGH (ref 70–99)
Potassium: 4.2 mmol/L (ref 3.5–5.1)
Sodium: 136 mmol/L (ref 135–145)

## 2018-06-14 LAB — POCT I-STAT EG7
Acid-base deficit: 13 mmol/L — ABNORMAL HIGH (ref 0.0–2.0)
Bicarbonate: 10.7 mmol/L — ABNORMAL LOW (ref 20.0–28.0)
Calcium, Ion: 0.75 mmol/L — CL (ref 1.15–1.40)
HCT: 25 % — ABNORMAL LOW (ref 39.0–52.0)
Hemoglobin: 8.5 g/dL — ABNORMAL LOW (ref 13.0–17.0)
O2 Saturation: 82 %
PCO2 VEN: 18 mmHg — AB (ref 44.0–60.0)
Potassium: 2.1 mmol/L — CL (ref 3.5–5.1)
Sodium: 145 mmol/L (ref 135–145)
TCO2: 11 mmol/L — ABNORMAL LOW (ref 22–32)
pH, Ven: 7.382 (ref 7.250–7.430)
pO2, Ven: 45 mmHg (ref 32.0–45.0)

## 2018-06-14 LAB — CBC WITH DIFFERENTIAL/PLATELET
Abs Immature Granulocytes: 0.03 10*3/uL (ref 0.00–0.07)
BASOS ABS: 0.1 10*3/uL (ref 0.0–0.1)
BASOS PCT: 1 %
EOS ABS: 0 10*3/uL (ref 0.0–0.5)
EOS PCT: 0 %
HCT: 47.6 % (ref 39.0–52.0)
Hemoglobin: 16.2 g/dL (ref 13.0–17.0)
Immature Granulocytes: 0 %
Lymphocytes Relative: 19 %
Lymphs Abs: 1.7 10*3/uL (ref 0.7–4.0)
MCH: 29.9 pg (ref 26.0–34.0)
MCHC: 34 g/dL (ref 30.0–36.0)
MCV: 87.8 fL (ref 80.0–100.0)
Monocytes Absolute: 1 10*3/uL (ref 0.1–1.0)
Monocytes Relative: 12 %
NRBC: 0 % (ref 0.0–0.2)
Neutro Abs: 5.9 10*3/uL (ref 1.7–7.7)
Neutrophils Relative %: 68 %
Platelets: 174 10*3/uL (ref 150–400)
RBC: 5.42 MIL/uL (ref 4.22–5.81)
RDW: 13.2 % (ref 11.5–15.5)
WBC: 8.7 10*3/uL (ref 4.0–10.5)

## 2018-06-14 LAB — URINALYSIS, ROUTINE W REFLEX MICROSCOPIC
Bilirubin Urine: NEGATIVE
Glucose, UA: NEGATIVE mg/dL
Hgb urine dipstick: NEGATIVE
Ketones, ur: NEGATIVE mg/dL
Leukocytes, UA: NEGATIVE
Nitrite: NEGATIVE
PH: 5 (ref 5.0–8.0)
Protein, ur: NEGATIVE mg/dL
SPECIFIC GRAVITY, URINE: 1.017 (ref 1.005–1.030)

## 2018-06-14 LAB — TROPONIN I
TROPONIN I: 0.11 ng/mL — AB (ref <0.03)
Troponin I: 0.09 ng/mL (ref <0.03)
Troponin I: 0.07 ng/mL (ref <0.03)

## 2018-06-14 LAB — MAGNESIUM
MAGNESIUM: 2.5 mg/dL — AB (ref 1.7–2.4)
MAGNESIUM: 2.3 mg/dL (ref 1.7–2.4)

## 2018-06-14 LAB — PROTIME-INR
INR: 0.97
Prothrombin Time: 12.8 seconds (ref 11.4–15.2)

## 2018-06-14 LAB — INFLUENZA PANEL BY PCR (TYPE A & B)
Influenza A By PCR: NEGATIVE
Influenza B By PCR: POSITIVE — AB

## 2018-06-14 LAB — HEMOGLOBIN A1C
Hgb A1c MFr Bld: 6.8 % — ABNORMAL HIGH (ref 4.8–5.6)
Mean Plasma Glucose: 148.46 mg/dL

## 2018-06-14 LAB — POC OCCULT BLOOD, ED: Fecal Occult Bld: NEGATIVE

## 2018-06-14 LAB — CBG MONITORING, ED: Glucose-Capillary: 205 mg/dL — ABNORMAL HIGH (ref 70–99)

## 2018-06-14 LAB — LIPASE, BLOOD: LIPASE: 58 U/L — AB (ref 11–51)

## 2018-06-14 LAB — TSH: TSH: 2.492 u[IU]/mL (ref 0.350–4.500)

## 2018-06-14 LAB — GLUCOSE, CAPILLARY
GLUCOSE-CAPILLARY: 183 mg/dL — AB (ref 70–99)
Glucose-Capillary: 140 mg/dL — ABNORMAL HIGH (ref 70–99)

## 2018-06-14 MED ORDER — ACETAMINOPHEN 325 MG PO TABS
650.0000 mg | ORAL_TABLET | Freq: Four times a day (QID) | ORAL | Status: DC | PRN
  Administered 2018-06-14 – 2018-06-15 (×2): 650 mg via ORAL
  Filled 2018-06-14 (×2): qty 2

## 2018-06-14 MED ORDER — HEPARIN SODIUM (PORCINE) 5000 UNIT/ML IJ SOLN
5000.0000 [IU] | Freq: Three times a day (TID) | INTRAMUSCULAR | Status: DC
  Administered 2018-06-14 – 2018-06-15 (×3): 5000 [IU] via SUBCUTANEOUS
  Filled 2018-06-14 (×3): qty 1

## 2018-06-14 MED ORDER — SODIUM CHLORIDE 0.9 % IV BOLUS
500.0000 mL | Freq: Once | INTRAVENOUS | Status: AC
  Administered 2018-06-14: 500 mL via INTRAVENOUS

## 2018-06-14 MED ORDER — ACETAMINOPHEN 650 MG RE SUPP: 650.0000 mg | Freq: Four times a day (QID) | RECTAL | Status: DC | PRN

## 2018-06-14 MED ORDER — INSULIN ASPART 100 UNIT/ML ~~LOC~~ SOLN
0.0000 [IU] | Freq: Three times a day (TID) | SUBCUTANEOUS | Status: DC
  Administered 2018-06-14: 2 [IU] via SUBCUTANEOUS
  Administered 2018-06-15: 3 [IU] via SUBCUTANEOUS

## 2018-06-14 MED ORDER — BISACODYL 5 MG PO TBEC: 5.0000 mg | DELAYED_RELEASE_TABLET | Freq: Every day | ORAL | Status: DC | PRN

## 2018-06-14 MED ORDER — POTASSIUM CHLORIDE CRYS ER 20 MEQ PO TBCR
40.0000 meq | EXTENDED_RELEASE_TABLET | Freq: Once | ORAL | Status: AC
  Administered 2018-06-14: 40 meq via ORAL
  Filled 2018-06-14: qty 2

## 2018-06-14 MED ORDER — SODIUM CHLORIDE 0.9% FLUSH: 3.0000 mL | Freq: Two times a day (BID) | INTRAVENOUS | Status: DC

## 2018-06-14 MED ORDER — ASPIRIN EC 81 MG PO TBEC
81.0000 mg | DELAYED_RELEASE_TABLET | Freq: Every day | ORAL | Status: DC
  Administered 2018-06-14: 81 mg via ORAL
  Filled 2018-06-14 (×2): qty 1

## 2018-06-14 MED ORDER — OSELTAMIVIR PHOSPHATE 75 MG PO CAPS: 75.0000 mg | ORAL_CAPSULE | Freq: Two times a day (BID) | ORAL | Status: DC

## 2018-06-14 MED ORDER — OMEGA-3-ACID ETHYL ESTERS 1 G PO CAPS
1.0000 | ORAL_CAPSULE | Freq: Two times a day (BID) | ORAL | Status: DC
  Administered 2018-06-14 – 2018-06-15 (×2): 1 g via ORAL
  Filled 2018-06-14 (×2): qty 1

## 2018-06-14 MED ORDER — INSULIN ASPART 100 UNIT/ML ~~LOC~~ SOLN: 0.0000 [IU] | Freq: Every day | SUBCUTANEOUS | Status: DC

## 2018-06-14 MED ORDER — PRAVASTATIN SODIUM 40 MG PO TABS
40.0000 mg | ORAL_TABLET | Freq: Every day | ORAL | Status: DC
  Administered 2018-06-14: 40 mg via ORAL
  Filled 2018-06-14: qty 1

## 2018-06-14 MED ORDER — LACTATED RINGERS IV SOLN
INTRAVENOUS | Status: DC
  Administered 2018-06-14 – 2018-06-15 (×3): via INTRAVENOUS

## 2018-06-14 MED ORDER — POTASSIUM CHLORIDE 10 MEQ/100ML IV SOLN
10.0000 meq | Freq: Once | INTRAVENOUS | Status: AC
  Administered 2018-06-14: 10 meq via INTRAVENOUS
  Filled 2018-06-14: qty 100

## 2018-06-14 MED ORDER — OSELTAMIVIR PHOSPHATE 75 MG PO CAPS
75.0000 mg | ORAL_CAPSULE | Freq: Two times a day (BID) | ORAL | Status: DC
  Administered 2018-06-14 – 2018-06-15 (×2): 75 mg via ORAL
  Filled 2018-06-14 (×2): qty 1

## 2018-06-14 MED ORDER — OXYCODONE-ACETAMINOPHEN 5-325 MG PO TABS
2.0000 | ORAL_TABLET | Freq: Once | ORAL | Status: AC
  Administered 2018-06-14: 2 via ORAL
  Filled 2018-06-14: qty 2

## 2018-06-14 NOTE — Procedures (Signed)
  [place] A. Merlene Laughter, MD     [email or web address]           HISTORY: This is a 63 year old man who presents with episode of loss of consciousness and syncope associated with bladder and bowel incontinence.  The studies been to evaluate for seizure as the etiology.  MEDICATIONS: Scheduled Meds: . aspirin EC  81 mg Oral Daily  . heparin  5,000 Units Subcutaneous Q8H  . insulin aspart  0-15 Units Subcutaneous TID WC  . insulin aspart  0-5 Units Subcutaneous QHS  . omega-3 acid ethyl esters  1 capsule Oral BID  . oseltamivir  75 mg Oral BID  . pravastatin  40 mg Oral q1800  . sodium chloride flush  3 mL Intravenous Q12H   Continuous Infusions: . lactated ringers 100 mL/hr at 06/14/18 1830   PRN Meds:.acetaminophen **OR** acetaminophen, bisacodyl     ANALYSIS: A 16 channel recording using standard 10 20 measurements is conducted for 24 minutes.  There is a well-formed posterior dominant rhythm of 8.5-9 Hz which attenuates with eye opening.  The study is somewhat degraded with sixth cycle lead popping artifact in the frontocentral leads but this is eliminated by turned on the 60 cycle notch.  There is also increase in the myogenic artifact somewhat.  Beta activity is observed in frontal areas.  Awake and drowsy activities are observed.  Photic stimulation and hyperventilation are not conducted.  There is no focal or lateralized slowing.  There is no epileptiform activity is observed.   IMPRESSION: This is a normal recording awake and drowsy states.      Delmus Warwick A. Merlene Laughter, M.D.  Diplomate, [employer] adviser of Psychiatry and Neurology ( Neurology).

## 2018-06-14 NOTE — ED Provider Notes (Signed)
[facility] EMERGENCY DEPARTMENT Provider Note   CSN: [id number] Arrival date & time: 06/14/18  1105     History   Chief Complaint Chief Complaint  Patient presents with  . Loss of Consciousness    HPI Aaron Weaver is a 63 y.o. male.  HPI Patient reports he believes he has had the flu for couple of weeks.  He had a known direct contact at work.  He subsequently came down with upper respiratory symptoms.  He reports he was having coughing and congestion.  Patient denies he was having chest pain or shortness of breath.  He reports however he was achy and very fatigued.  He reports that he has been eating and drinking very little.  He has had no appetite.  He denies he has had abdominal pain.  He has had small amounts of diarrhea but more today.  Patient has continued to be compliant with his blood pressure medications and diuretics despite significantly decreased oral intake.  He reports today he went into the kitchen to make himself some toast and passed out.  He reports he went down onto his butt and in his back.  He denies he struck his head.  He denies any headache.  He got back up and went to rest in bed again.  Then he needed to go to the bathroom.  When he got up and got on the toilet he got again very lightheaded his wife came in with him and reports that in a seated position on the toilet he seemed to be having some shaking episode.  He was still kind of supporting himself against the walls with his arms.  She reports that lasted maybe a minute and then he got calm and then just came around to himself again.  She supported him through this episode.  She reports at that time he was incontinent of stool.  Now, the patient reports he feels back to baseline.  He is alert and appropriate.  He denies headache.  Denies chest pain or shortness of breath. Past Medical History:  Diagnosis Date  . Acute sinusitis, unspecified   . Allergic rhinitis   . Allergy   . Anxiety  state, unspecified   . Corns and callosities    toe  . Cough   . Cramp of limb    legs  . Depression   . Impacted cerumen   . Impotence of organic origin   . Lumbago   . Other and unspecified hyperlipidemia   . Routine general medical examination at a health care facility   . Tobacco use disorder   . Type II or unspecified type diabetes mellitus without mention of complication, not stated as uncontrolled    type II  . Unspecified essential hypertension     Patient Active Problem List   Diagnosis Date Noted  . Syncope and collapse 04/03/2012  . Hypokalemia 04/03/2012  . Chest wall contusion 04/03/2012  . Well adult exam 11/27/2011  . Restless leg syndrome 07/26/2011  . Hip pain, left 03/28/2011  . 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 Lumbago 07/22/2010  . Nonspecific (abnormal) findings on radiological and other examination of body structure 01/07/2009  . ABNORMAL CHEST XRAY 01/07/2009  . COUGH 12/14/2008  . SINUSITIS, ACUTE 06/18/2008  . CRAMPS,LEG 06/18/2008  . TOBACCO USE DISORDER/SMOKER-SMOKING CESSATION DISCUSSED 04/10/2008  . CERUMEN IMPACTION 04/10/2008  . CALLUS, TOE 04/10/2008  . HYPERLIPIDEMIA 03/14/2007  . DEPRESSION 03/14/2007  . ALLERGIC RHINITIS 03/14/2007  . DIABETES  MELLITUS, TYPE II 01/11/2007  . ANXIETY 01/11/2007  . HYPERTENSION 01/11/2007    Past Surgical History:  Procedure Laterality Date  . COLONOSCOPY    . INGUINAL HERNIA REPAIR     right  . POLYPECTOMY          Home Medications    Prior to Admission medications   Medication Sig Start Date End Date Taking? Authorizing Provider  aspirin 81 MG EC tablet Take 81 mg by mouth daily.      [provider]  atenolol-chlorthalidone (TENORETIC) 100-25 MG per tablet TAKE 1 TABLET BY MOUTH DAILY. 12/16/12   Plotnikov, Evie Lacks, MD  Cholecalciferol (VITAMIN D3) 1000 UNITS tablet Take 1,000 Units by mouth daily.      [provider]  Fish Oil OIL by Does not apply route 2  (two) times daily.      [provider]  glimepiride (AMARYL) 2 MG tablet Take 1 tablet (2 mg total) by mouth daily before breakfast. 04/03/12   Plotnikov, Evie Lacks, MD  JANUMET XR 50-1000 MG TB24  09/05/17   [provider]  lovastatin (MEVACOR) 20 MG tablet Take 20 mg by mouth at bedtime.    [provider]  metFORMIN (GLUCOPHAGE) 1000 MG tablet TAKE 1 TABLET (1,000 MG TOTAL) BY MOUTH 2 (TWO) TIMES DAILY WITH A MEAL. Patient not taking: Reported on 10/12/2017 12/12/12   Plotnikov, Evie Lacks, MD  Misc Natural Products (GLUCOSAMINE CHONDROITIN VIT D3) CAPS Take 2 each by mouth daily.      [provider]  oxyCODONE-acetaminophen (PERCOCET) 10-325 MG per tablet Take 1 tablet by mouth 2 (two) times daily as needed. 08/01/12   Plotnikov, Evie Lacks, MD  PARoxetine (PAXIL) 40 MG tablet TAKE 1 TABLET (40 MG TOTAL) BY MOUTH EVERY MORNING. 10/09/12   Plotnikov, Evie Lacks, MD  potassium chloride SA (KLOR-CON M20) 20 MEQ tablet TAKE 1 TABLET (20 MEQ TOTAL) BY MOUTH DAILY.  02/13/13   Plotnikov, Evie Lacks, MD  quinapril (ACCUPRIL) 20 MG tablet TAKE 1 TABLET BY MOUTH EVERY DAY 01/13/13   Plotnikov, Evie Lacks, MD  sildenafil (VIAGRA) 100 MG tablet Take 1 tablet (100 mg total) by mouth daily as needed. 07/26/11   Plotnikov, Evie Lacks, MD    Family History Family History  Problem Relation Age of Onset  . Cancer Mother        colon  . Colon cancer Mother 51  . Hypertension Father   . Coronary artery disease Other        male<60 and male<50  1st degree relative  . Colon polyps Brother   . Esophageal cancer Neg Hx   . Rectal cancer Neg Hx   . Stomach cancer Neg Hx     Social History Social History   Tobacco Use  . Smoking status: Current Every Day Smoker    Packs/day: 0.50    Years: 0.00    Pack years: 0.00    Types: Cigarettes  . Smokeless tobacco: Never Used  . Tobacco comment: started at age 86.  Substance Use Topics  . Alcohol use: No  . Drug use: No      Allergies   Fexofenadine; Hydrochlorothiazide; Loratadine; Penicillins; Pravastatin sodium; Requip [ropinirole hcl]; and Sudafed [pseudoephedrine]   Review of Systems Review of Systems 10 Systems reviewed and are negative for acute change except as noted in the HPI.   Physical Exam Updated Vital Signs BP (!) 85/54 (BP Location: Right Arm)   Pulse 62   Temp (!) 97.3  F (36.3 C) (Oral)   Resp 19   Ht 6' (1.829 m)   Wt 86.2 kg   SpO2 100%   BMI 25.77 kg/m   Physical Exam Constitutional:      Appearance: He is well-developed.  HENT:     Head: Normocephalic and atraumatic.     Nose: Nose normal.     Mouth/Throat:     Mouth: Mucous membranes are moist.  Eyes:     Pupils: Pupils are equal, round, and reactive to light.  Neck:     Musculoskeletal: Neck supple.  Cardiovascular:     Rate and Rhythm: Normal rate and regular rhythm.     Heart sounds: Normal heart sounds.  Pulmonary:     Effort: Pulmonary effort is normal.     Breath sounds: Normal breath sounds.  Abdominal:     General: Bowel sounds are normal. There is no distension.     Palpations: Abdomen is soft.     Tenderness: There is no abdominal tenderness.  Musculoskeletal: Normal range of motion.        General: No swelling or tenderness.     Right lower leg: No edema.     Left lower leg: No edema.  Skin:    General: Skin is warm and dry.  Neurological:     General: No focal deficit present.     Mental Status: He is alert and oriented to person, place, and time.     GCS: GCS eye subscore is 4. GCS verbal subscore is 5. GCS motor subscore is 6.     Coordination: Coordination normal.  Psychiatric:        Mood and Affect: Mood normal.      ED Treatments / Results  Labs (all labs ordered are listed, but only abnormal results are displayed) Labs Reviewed  COMPREHENSIVE METABOLIC PANEL  LIPASE, BLOOD  CBC WITH DIFFERENTIAL/PLATELET  TROPONIN I  PROTIME-INR  URINALYSIS, ROUTINE W REFLEX  MICROSCOPIC  RAPID URINE DRUG SCREEN, HOSP PERFORMED  INFLUENZA PANEL BY PCR (TYPE A & B)  POC OCCULT BLOOD, ED    EKG EKG Interpretation  Date/Time:  Friday June 14 2018 15:02:09 EST Ventricular Rate:  60 PR Interval:    QRS Duration: 90 QT Interval:  422 QTC Calculation: 422 R Axis:   89 Text Interpretation:  Sinus rhythm Ventricular premature complex Borderline right axis deviation Baseline wander in lead(s) V1 Confirmed by Ripley Fraise [phone]) on 06/15/2018 2:13:40 PM   Radiology No results found.  Procedures Procedures (including critical care time) CRITICAL CARE Performed by: Charlesetta Shanks   Total critical care time: 30 minutes  Critical care time was exclusive of separately billable procedures and treating other patients.  Critical care was necessary to treat or prevent imminent or life-threatening deterioration.  Critical care was time spent personally by me on the following activities: development of treatment plan with patient and/or surrogate as well as nursing, discussions with consultants, evaluation of patient's response to treatment, examination of patient, obtaining history from patient or surrogate, ordering and performing treatments and interventions, ordering and review of laboratory studies, ordering and review of radiographic studies, pulse oximetry and re-evaluation of patient's condition. Medications Ordered in ED Medications  sodium chloride 0.9 % bolus 500 mL (has no administration in time range)     Initial Impression / Assessment and Plan / ED Course  I have reviewed the triage vital signs and the nursing notes.  Pertinent labs & imaging results that were available during my care of  the patient were reviewed by me and considered in my medical decision making (see chart for details).    Patient has been sick with influenza illness.  He has become dehydrated with poor oral intake.  Patient with syncope today.  I suspect this is from  severe dehydration from poor oral intake with continued use of his diuretics and antihypertensive agents.  Patient's mental status is clear.  He does have mild troponin elevation possibly demand ischemia with syncope but patient will require rule out for MI.  He has no chest pain.  Patient was significantly hypotensive but is improved with fluids.  Will admit for further hydration and syncope with rule out MI.  Final Clinical Impressions(s) / ED Diagnoses   Final diagnoses:  Syncope and collapse  Hypotension, unspecified hypotension type  Dehydration    ED Discharge Orders    None       Charlesetta Shanks, MD 06/21/18 2019

## 2018-06-14 NOTE — Progress Notes (Signed)
Notified Bodenhemir, NP of pt's orthostatic VS results. Will continue to monitor pt. Ranelle Oyster, RN

## 2018-06-14 NOTE — ED Notes (Signed)
Family at bedside reports pt had seizure today an hour after the fall. Pt denies hx of seizures

## 2018-06-14 NOTE — ED Triage Notes (Signed)
Pt arrives with reports of a syncopal episode today. Pt states he has had flu-like symptoms for 2 days and feels dehydrated. Pt endorses loss of appetite.

## 2018-06-14 NOTE — Progress Notes (Signed)
  Pt orientation to unit, room and routine. Information packet given to patient/family and safety video watched.  Admission INP armband ID verified with patient/family, and in place. SR up x 2, fall risk assessment complete with Patient and family verbalizing understanding of risks associated with falls. Pt verbalizes an understanding of how to use the call bell and to call for help before getting out of bed.  Skin, clean-dry- intact without evidence of bruising, or skin tears.   No evidence of skin break down noted on exam. Pt placed on a low bed and educated to use the call bell when getting out.

## 2018-06-14 NOTE — ED Notes (Signed)
Patient transported to MRI 

## 2018-06-14 NOTE — H&P (Signed)
[facility] H&P   Patient Demographics:    Aaron Weaver, is a 63 y.o. male  MRN: [id number]   DOB - Feb 21, 1956  Admit Date - 06/14/2018  Outpatient Primary MD for the patient is [name], MD    Patient coming from: Home  Chief Complaint  Patient presents with  . Loss of Consciousness      HPI:    Aaron Weaver  is a 63 y.o. male, with history of DM type II, hypertension, dyslipidemia who lives at home and was diagnosed with flu few days ago, took Tamiflu for 2 to 3 days, was not eating or drinking well.  This morning he stood up and had a small episode of syncope where he fell to the floor, he then sat down and felt okay, later this morning he was going to the bathroom and had another episode upon walking where he fell to the floor he could have lost consciousness for a few seconds and had stool incontinence.    Came to the ER where he was diagnosed with dehydration, hypotension, troponin was top normal and I was requested to come and see the patient for further care.  He currently denies any headache, no chest or abdominal pain, no shortness of breath or focal weakness.  Heart problems, no history of seizures, no malignancies.    Review of systems:    A full 10 point Review of Systems was done, except as stated above, all other Review of Systems were negative.   With Past History of the following :    Past Medical History:  Diagnosis Date  . Acute sinusitis, unspecified   . Allergic rhinitis   . Allergy   . Anxiety state, unspecified   . Corns and callosities    toe  . Cough   . Cramp of limb    legs  . Depression   . Impacted cerumen   . Impotence of organic origin   . Lumbago   . Other and  unspecified hyperlipidemia   . Routine general medical examination at a health care facility   . Tobacco use disorder   . Type II or unspecified type diabetes mellitus without mention of complication, not stated as uncontrolled    type II  . Unspecified essential hypertension       Past Surgical History:  Procedure  Laterality Date  . COLONOSCOPY    . INGUINAL HERNIA REPAIR     right  . POLYPECTOMY        Social History:     Social History   Tobacco Use  . Smoking status: Current Every Day Smoker    Packs/day: 0.50    Years: 0.00    Pack years: 0.00    Types: Cigarettes  . Smokeless tobacco: Never Used  . Tobacco comment: started at age 21.  Substance Use Topics  . Alcohol use: No         Family History :     Family History  Problem Relation Age of Onset  . Cancer Mother        colon  . Colon cancer Mother 68  . Hypertension Father   . Coronary artery disease Other        male<60 and male<50  1st degree relative  . Colon polyps Brother   . Esophageal cancer Neg Hx   . Rectal cancer Neg Hx   . Stomach cancer Neg Hx        Home Medications:   Prior to Admission medications   Medication Sig Start Date End Date Taking? Authorizing Provider  aspirin 81 MG EC tablet Take 81 mg by mouth daily.      [provider]  atenolol-chlorthalidone (TENORETIC) 100-25 MG per tablet TAKE 1 TABLET BY MOUTH DAILY. 12/16/12   Plotnikov, Evie Lacks, MD  Cholecalciferol (VITAMIN D3) 1000 UNITS tablet Take 1,000 Units by mouth daily.      [provider]  Fish Oil OIL by Does not apply route 2 (two) times daily.      [provider]  glimepiride (AMARYL) 2 MG tablet Take 1 tablet (2 mg total) by mouth daily before breakfast. 04/03/12   Plotnikov, Evie Lacks, MD  JANUMET XR 50-1000 MG TB24  09/05/17   [provider]  lovastatin (MEVACOR) 20 MG tablet Take 20 mg by mouth at bedtime.    [provider]  metFORMIN (GLUCOPHAGE) 1000 MG  tablet TAKE 1 TABLET (1,000 MG TOTAL) BY MOUTH 2 (TWO) TIMES DAILY WITH A MEAL. Patient not taking: Reported on 10/12/2017 12/12/12   Plotnikov, Evie Lacks, MD  Misc Natural Products (GLUCOSAMINE CHONDROITIN VIT D3) CAPS Take 2 each by mouth daily.      [provider]  oxyCODONE-acetaminophen (PERCOCET) 10-325 MG per tablet Take 1 tablet by mouth 2 (two) times daily as needed. 08/01/12   Plotnikov, Evie Lacks, MD  PARoxetine (PAXIL) 40 MG tablet TAKE 1 TABLET (40 MG TOTAL) BY MOUTH EVERY MORNING. 10/09/12   Plotnikov, Evie Lacks, MD  potassium chloride SA (KLOR-CON M20) 20 MEQ tablet TAKE 1 TABLET (20 MEQ TOTAL) BY MOUTH DAILY.  02/13/13   Plotnikov, Evie Lacks, MD  quinapril (ACCUPRIL) 20 MG tablet TAKE 1 TABLET BY MOUTH EVERY DAY 01/13/13   Plotnikov, Evie Lacks, MD  sildenafil (VIAGRA) 100 MG tablet Take 1 tablet (100 mg total) by mouth daily as needed. 07/26/11   Plotnikov, Evie Lacks, MD     Allergies:     Allergies  Allergen Reactions  . Fexofenadine     "dries me out too much"  . Hydrochlorothiazide     REACTION: cramps  . Loratadine     sleepy  . Penicillins Hives  . Pravastatin Sodium     REACTION: aches  . Requip [Ropinirole Hcl]     Bad dreams  . Sudafed [Pseudoephedrine]     dry  Physical Exam:   Vitals  Blood pressure 106/67, pulse (!) 58, temperature 98.7 F (37.1 C), temperature source Rectal, resp. rate 13, height 6' (1.829 m), weight 86.2 kg, SpO2 96 %.   1. General elderly moderately built white male lying in hospital bed in no apparent discomfort,  2. Normal affect and insight, Not Suicidal or Homicidal, Awake Alert, Oriented X 3.  3. No F.N deficits, ALL C.Nerves Intact, Strength 5/5 all 4 extremities, Sensation intact all 4 extremities, Plantars down going.  4. Ears and Eyes appear Normal, Conjunctivae clear, PERRLA. Moist Oral Mucosa.  5. Supple Neck, No JVD, No cervical lymphadenopathy appriciated, No Carotid Bruits.  6. Symmetrical Chest wall  movement, Good air movement bilaterally, CTAB.  7. RRR, No Gallops, Rubs or Murmurs, No Parasternal Heave.  8. Positive Bowel Sounds, Abdomen Soft, No tenderness, No organomegaly appriciated,No rebound -guarding or rigidity.  9.  No Cyanosis, Normal Skin Turgor, No Skin Rash or Bruise.  10. Good muscle tone,  joints appear normal , no effusions, Normal ROM.  11. No Palpable Lymph Nodes in Neck or Axillae      Data Review:    CBC Recent Labs  Lab 06/14/18 1134 06/14/18 1156  WBC 8.7  --   HGB 16.2 8.5*  HCT 47.6 25.0*  PLT 174  --   MCV 87.8  --   MCH 29.9  --   MCHC 34.0  --   RDW 13.2  --   LYMPHSABS 1.7  --   MONOABS 1.0  --   EOSABS 0.0  --   BASOSABS 0.1  --    ------------------------------------------------------------------------------------------------------------------  Chemistries  Recent Labs  Lab 06/14/18 1134 06/14/18 1156  NA 136 145  K 4.0 2.1*  CL 100  --   CO2 22  --   GLUCOSE 231*  --   BUN 38*  --   CREATININE 1.81*  --   CALCIUM 9.5  --   MG 2.5*  --   AST 57*  --   ALT 60*  --   ALKPHOS 51  --   BILITOT 1.1  --    ------------------------------------------------------------------------------------------------------------------ estimated creatinine clearance is 45.3 mL/min (A) (by C-G formula based on SCr of 1.81 mg/dL (H)). ------------------------------------------------------------------------------------------------------------------ No results for input(s): TSH, T4TOTAL, T3FREE, THYROIDAB in the last 72 hours.  Invalid input(s): FREET3  Coagulation profile Recent Labs  Lab 06/14/18 1134  INR 0.97   ------------------------------------------------------------------------------------------------------------------- No results for input(s): DDIMER in the last 72 hours. -------------------------------------------------------------------------------------------------------------------  Cardiac Enzymes Recent Labs  Lab  06/14/18 1134  TROPONINI 0.11*   ------------------------------------------------------------------------------------------------------------------ No results found for: BNP   ---------------------------------------------------------------------------------------------------------------  Urinalysis    Component Value Date/Time   COLORURINE YELLOW 06/14/2018 1135   APPEARANCEUR HAZY (A) 06/14/2018 1135   LABSPEC 1.017 06/14/2018 1135   PHURINE 5.0 06/14/2018 1135   GLUCOSEU NEGATIVE 06/14/2018 1135   GLUCOSEU NEGATIVE 11/23/2011 0756   HGBUR NEGATIVE 06/14/2018 1135   BILIRUBINUR NEGATIVE 06/14/2018 1135   KETONESUR NEGATIVE 06/14/2018 1135   PROTEINUR NEGATIVE 06/14/2018 1135   UROBILINOGEN 0.2 11/23/2011 0756   NITRITE NEGATIVE 06/14/2018 1135   LEUKOCYTESUR NEGATIVE 06/14/2018 1135    ----------------------------------------------------------------------------------------------------------------   Imaging Results:    No results found.  My personal review of EKG: Rhythm NSR,   no Acute ST changes   Assessment & Plan:    1.  Syncope and collapse likely due to profound hypotension from dehydration -23-hour observation on telemetry, since there was bowel incontinence will get EEG and MRI  brain, monitor orthostatics, IV fluids, PT evaluation in the morning.  Discussed with neurology.  2.  Mildly elevated troponin.  No chest pain, likely insignificant, cycle troponin, check echocardiogram telemetry monitor.  Aspirin and statin for now.  3.  Influenza B infection.  Complete Tamiflu course.    4.  DM type II.  Hold p.o. medications, check A1c moderate dose sliding scale and monitor.    5.  Dyslipidemia.  Continue home dose statin.    6.  Hypokalemia.  I-STAT, could be wrong, repeat BMP.      DVT Prophylaxis Heparin    AM Labs Ordered, also please review Full Orders  Family Communication: Admission, patients condition and plan of care including tests being ordered  have been discussed with the patient  who indicates understanding and agree with the plan and Code Status.  Code Status Full  Likely DC to  Home  Condition GUARDED    Consults called: Neuro    Admission status: Obs    Time spent in minutes : 35   Lala Lund M.D on 06/14/2018 at 4:06 PM  To page go to [email or web address] - password [facility]

## 2018-06-14 NOTE — Progress Notes (Signed)
NEURO HOSPITALIST CONSULT NOTE   Requestig physician: Dr. Candiss Norse  Reason for Consult: Syncope with some jerking perceived by wife  History obtained from:   Patient and Chart     HPI:                                                                                                                                          Aaron Weaver is an 63 y.o. male who has had the flu and as a result, states that he has been deyhdrated due to diarrhea and insufficient fluid intake. This morning he had a syncopal spell which he states he feels was definitely due to fainting. During this spell, he fell to the floor. Later this morning he went to the bathroom to expel his bowels as he felt a bout of diarrhea coming on. While sitting on the toilet and still in his underwear, he felt lightheaded and unstable, having to grab the walls adjacent to the toilet. He then lost control of his bowels while still in his underwear and had either a syncopal or near-syncopal spell simultaneously. He has a poor memory of this portion of the event, but states that he did not fully lose consciousness or fall off the toilet onto the floor, nor did he slump sideways or back, being able to maintain a degree of postural control during the event. His wife per Dr. Candiss Norse did witness what looked like possible jerking during the spell.   In the ED he was diagnosed with dehydration and hypotension.   He has no history of seizures.   Past Medical History:  Diagnosis Date  . Acute sinusitis, unspecified   . Allergic rhinitis   . Allergy   . Anxiety state, unspecified   . Corns and callosities    toe  . Cough   . Cramp of limb    legs  . Depression   . Impacted cerumen   . Impotence of organic origin   . Lumbago   . Other and unspecified hyperlipidemia   . Routine general medical examination at a health care facility   . Tobacco use disorder   . Type II or unspecified type diabetes mellitus without mention of  complication, not stated as uncontrolled    type II  . Unspecified essential hypertension     Past Surgical History:  Procedure Laterality Date  . COLONOSCOPY    . INGUINAL HERNIA REPAIR     right  . POLYPECTOMY      Family History  Problem Relation Age of Onset  . Cancer Mother        colon  . Colon cancer Mother 53  . Hypertension Father   . Coronary artery disease Other        male<60 and male<50  1st degree relative  . Colon polyps Brother   . Esophageal cancer Neg Hx   . Rectal cancer Neg Hx   . Stomach cancer Neg Hx               Social History:  reports that he has been smoking cigarettes. He has been smoking about 0.50 packs per day for the past 0.00 years. He has never used smokeless tobacco. He reports that he does not drink alcohol or use drugs.  Allergies  Allergen Reactions  . Fexofenadine     "dries me out too much"  . Hydrochlorothiazide     REACTION: cramps  . Loratadine     sleepy  . Penicillins Hives  . Pravastatin Sodium     REACTION: aches  . Requip [Ropinirole Hcl]     Bad dreams  . Sudafed [Pseudoephedrine]     dry    HOME MEDICATIONS:                                                                                                                     ASA Tenoretic Vitamin D3 Fish ol Glimepiride Janumet Lovastatin Metformin Chondroitin Percocet Paxil Klor-Con Accupril Viagra   ROS:                                                                                                                                       Does not endorse headache or pain. Denies focal weakness. Denies jerking or twitching. No CP, SOB or abdominal pain. Complains of feeling dehydrated. Other ROS positives as per HPI. 10 point ROS otherwise negative.   Blood pressure 106/67, pulse (!) 58, temperature 98.7 F (37.1 C), temperature source Rectal, resp. rate 13, height 6' (1.829 m), weight 86.2 kg, SpO2 96 %.   General Examination:                                                                                                        Physical Exam  HEENT-  [place]/AT. Decreased hydration of oral mucosa. Lungs- Respirations unlabored Extremities- No edema  Neurological Examination Mental Status: Alert, fully oriented, thought content appropriate.  Speech fluent without evidence of aphasia.  Able to follow all commands without difficulty. Cranial Nerves: II: Visual fields intact without extinction to DSS. PERRL.   III,IV, VI: EOMI without nystagmus. No ptosis.  V,VII: No facial droop. Facial temp sensation equal bilaterally  VIII: hearing intact to voice IX,X: Uvula midline XI: No asymmetry XII: midline tongue extension Motor: Right : Upper extremity   5/5    Left:     Upper extremity   5/5  Lower extremity   5/5     Lower extremity   5/5 Normal tone throughout; no atrophy noted Sensory: Temp and light touch intact throughout, bilaterally. No extinction. Deep Tendon Reflexes: 2+ and symmetric throughout Plantars: Right: downgoing   Left: downgoing Cerebellar: No ataxia with FNF bilaterally  Gait: Deferred   Lab Results: Basic Metabolic Panel: Recent Labs  Lab 06/14/18 1134 06/14/18 1156  NA 136 145  K 4.0 2.1*  CL 100  --   CO2 22  --   GLUCOSE 231*  --   BUN 38*  --   CREATININE 1.81*  --   CALCIUM 9.5  --   MG 2.5*  --     CBC: Recent Labs  Lab 06/14/18 1134 06/14/18 1156  WBC 8.7  --   NEUTROABS 5.9  --   HGB 16.2 8.5*  HCT 47.6 25.0*  MCV 87.8  --   PLT 174  --     Cardiac Enzymes: Recent Labs  Lab 06/14/18 1134  TROPONINI 0.11*    Lipid Panel: No results for input(s): CHOL, TRIG, HDL, CHOLHDL, VLDL, LDLCALC in the last 168 hours.  Imaging: No results found.   Assessment: 63 year old male with syncopal spells x 2 at home, the second with possible mild convulsive activity 1. Overall clinical picture most consistent with syncope due to hypotension. If a convulsion occurred, it would most likely have  been precipitated by transient brain hypoperfusion (syncopal convulsion, which is not classifiable as an epileptic seizure).  2. Elevated BUN/Cr.   Recommendations: 1. Hydrate well 2. EEG completed with report pending.   Electronically signed: Dr. Kerney Elbe 06/14/2018, 4:32 PM

## 2018-06-14 NOTE — Progress Notes (Signed)
EEG completed; results pending.    

## 2018-06-15 ENCOUNTER — Other Ambulatory Visit (HOSPITAL_COMMUNITY): Payer: BLUE CROSS/BLUE SHIELD

## 2018-06-15 DIAGNOSIS — I959 Hypotension, unspecified: Secondary | ICD-10-CM | POA: Diagnosis not present

## 2018-06-15 LAB — BASIC METABOLIC PANEL
Anion gap: 13 (ref 5–15)
BUN: 28 mg/dL — ABNORMAL HIGH (ref 8–23)
CO2: 24 mmol/L (ref 22–32)
Calcium: 8.7 mg/dL — ABNORMAL LOW (ref 8.9–10.3)
Chloride: 99 mmol/L (ref 98–111)
Creatinine, Ser: 1 mg/dL (ref 0.61–1.24)
GFR calc Af Amer: >60 mL/min (ref >60)
GFR calc non Af Amer: >60 mL/min (ref >60)
GLUCOSE: 136 mg/dL — AB (ref 70–99)
Potassium: 3.9 mmol/L (ref 3.5–5.1)
Sodium: 136 mmol/L (ref 135–145)

## 2018-06-15 LAB — HIV ANTIBODY (ROUTINE TESTING W REFLEX): HIV SCREEN 4TH GENERATION: NONREACTIVE

## 2018-06-15 LAB — GLUCOSE, CAPILLARY: Glucose-Capillary: 185 mg/dL — ABNORMAL HIGH (ref 70–99)

## 2018-06-15 MED ORDER — ASPIRIN 81 MG PO TBEC: 81.0000 mg | DELAYED_RELEASE_TABLET | Freq: Every day | ORAL | 0 refills | Status: DC

## 2018-06-15 NOTE — Evaluation (Signed)
Physical Therapy Evaluation & Discharge Patient Details Name: Aaron Weaver MRN: [id number] DOB: May 16, 1955 Today's Date: 06/15/2018   History of Present Illness  Pt is a 63 y/o male admitted secondary to syncope and collapse likely due to profound hypotension from dehydration. Of note pt also with recent flu diagnosis. PMH including but not limited to HTN and DM.  Clinical Impression  Pt presented supine in bed with HOB elevated, awake and willing to participate in therapy session. Pt's spouse present throughout session as well. Prior to admission, pt reported that he was independent with all functional mobility and ADLs. Pt lives in a single level house with a ramped entrance. He is currently at supervision to independent level with all functional mobility. Pt was asymptomatic throughout. He reported that he feels he is at his baseline in regards to functional mobility. No further acute PT needs identified at this time. PT signing off.     Follow Up Recommendations No PT follow up    Equipment Recommendations  None recommended by PT    Recommendations for Other Services       Precautions / Restrictions Precautions Precautions: Fall Restrictions Weight Bearing Restrictions: No      Mobility  Bed Mobility Overal bed mobility: Independent                Transfers Overall transfer level: Needs assistance Equipment used: None Transfers: Sit to/from Stand Sit to Stand: Supervision         General transfer comment: for safety  Ambulation/Gait Ambulation/Gait assistance: Supervision Gait Distance (Feet): 200 Feet Assistive device: IV Pole Gait Pattern/deviations: Step-through pattern Gait velocity: able to fluctuate   General Gait Details: no instability or LOB, no need for physical assistance, supervision for safety; pt able to safely navigate around obstacles in hallway with no difficulties  Stairs            Wheelchair Mobility    Modified Rankin  (Stroke Patients Only)       Balance Overall balance assessment: Needs assistance Sitting-balance support: Feet supported Sitting balance-Leahy Scale: Good     Standing balance support: During functional activity;No upper extremity supported Standing balance-Leahy Scale: Good                               Pertinent Vitals/Pain Pain Assessment: No/denies pain    Home Living Family/patient expects to be discharged to:: Private residence Living Arrangements: Spouse/significant other Available Help at Discharge: Family;Available 24 hours/day Type of Home: House Home Access: Stairs to enter;Ramped entrance Entrance Stairs-Rails: Right;Left Entrance Stairs-Number of Steps: 2 Home Layout: One level Home Equipment: None      Prior Function Level of Independence: Independent               Hand Dominance   Dominant Hand: Right    Extremity/Trunk Assessment   Upper Extremity Assessment Upper Extremity Assessment: Overall WFL for tasks assessed    Lower Extremity Assessment Lower Extremity Assessment: Overall WFL for tasks assessed    Cervical / Trunk Assessment Cervical / Trunk Assessment: Normal  Communication   Communication: No difficulties  Cognition Arousal/Alertness: Awake/alert Behavior During Therapy: WFL for tasks assessed/performed Overall Cognitive Status: Within Functional Limits for tasks assessed  General Comments      Exercises     Assessment/Plan    PT Assessment Patent does not need any further PT services  PT Problem List         PT Treatment Interventions      PT Goals (Current goals can be found in the Care Plan section)  Acute Rehab PT Goals Patient Stated Goal: "go home today" PT Goal Formulation: All assessment and education complete, DC therapy    Frequency     Barriers to discharge        Co-evaluation               AM-PAC PT "6 Clicks"  Mobility  Outcome Measure Help needed turning from your back to your side while in a flat bed without using bedrails?: None Help needed moving from lying on your back to sitting on the side of a flat bed without using bedrails?: None Help needed moving to and from a bed to a chair (including a wheelchair)?: None Help needed standing up from a chair using your arms (e.g., wheelchair or bedside chair)?: None Help needed to walk in hospital room?: None Help needed climbing 3-5 steps with a railing? : None 6 Click Score: 24    End of Session Equipment Utilized During Treatment: Gait belt Activity Tolerance: Patient tolerated treatment well Patient left: in bed;with call bell/phone within reach;with family/visitor present Nurse Communication: Mobility status PT Visit Diagnosis: Other abnormalities of gait and mobility (R26.89)    Time: 1010-1024 PT Time Calculation (min) (ACUTE ONLY): 14 min   Charges:   PT Evaluation $PT Eval Low Complexity: [place], PT, DPT  Acute Rehabilitation Services Pager [phone] Office [place] 06/15/2018, 11:09 AM

## 2018-06-15 NOTE — Discharge Instructions (Signed)
Follow with Primary MD [name], MD in 3 days, get an outpatient echocardiogram scheduled.  Get CBC, CMP, 2 view Chest X ray -  checked  by Primary MD in 3 days.  Activity: As tolerated with Full fall precautions use walker/cane & assistance as needed  Disposition Home   Diet: Heart Healthy Low Carb.  Special Instructions: If you have smoked or chewed Tobacco  in the last 2 yrs please stop smoking, stop any regular Alcohol  and or any Recreational drug use.  On your next visit with your primary care physician please Get Medicines reviewed and adjusted.  Please request your Prim.MD to go over all Hospital Tests and Procedure/Radiological results at the follow up, please get all Hospital records sent to your Prim MD by signing hospital release before you go home.  If you experience worsening of your admission symptoms, develop shortness of breath, life threatening emergency, suicidal or homicidal thoughts you must seek medical attention immediately by calling 911 or calling your MD immediately  if symptoms less severe.  You Must read complete instructions/literature along with all the possible adverse reactions/side effects for all the Medicines you take and that have been prescribed to you. Take any new Medicines after you have completely understood and accpet all the possible adverse reactions/side effects.

## 2018-06-15 NOTE — Progress Notes (Signed)
Patient was discharged home by MD order; discharged instructions  review and give to patient with care notes; IV DIC; skin intact; patient will be escorted to the car by nurse tech via wheelchair.  

## 2018-06-15 NOTE — Discharge Summary (Signed)
Aaron EHRLER WYO:[id number] DOB: Oct 11, 1955 DOA: 06/14/2018  PCP: [name], MD  Admit date: 06/14/2018  Discharge date: 06/15/2018  Admitted From: Home   Disposition:  Home   Recommendations for Outpatient Follow-up:   Follow up with PCP in 1-2 weeks  PCP Please obtain BMP/CBC, 2 view CXR in 1week,  (see Discharge instructions)   PCP Please follow up on the following pending results:    Home Health: None   Equipment/Devices: None  Consultations: Neuro Discharge Condition: Stable   CODE STATUS: Full   Diet Recommendation: Heart Healthy Low Carb    Chief Complaint  Patient presents with  . Loss of Consciousness     Brief history of present illness from the day of admission and additional interim summary     Aaron Weaver  is a 63 y.o. male, with history of DM type II, hypertension, dyslipidemia who lives at home and was diagnosed with flu few days ago, took Tamiflu for 2 to 3 days, was not eating or drinking well.  This morning he stood up and had a small episode of syncope where he fell to the floor, he then sat down and felt okay, later this morning he was going to the bathroom and had another episode upon walking where he fell to the floor he could have lost consciousness for a few seconds and had stool incontinence.    Came to the ER where he was diagnosed with dehydration, hypotension, troponin was top normal and I was requested to come and see the patient for further care.  He currently denies any headache, no chest or abdominal pain, no shortness of breath or focal weakness.  Heart problems, no history of seizures, no malignancies.                                                                  Hospital Course    1.  Syncope and collapse likely due to profound hypotension from dehydration -kept  on 23-hour observation on telemetry, aggressively hydrated, back to baseline, not orthostatic and symptom-free ambulating without any problems, EEG, CT head, MRI brain all nonacute.  This likely was severe hypotension which was caused by dehydration related hypoperfusion to the brain with syncope and bowel incontinence.  Do not think this was a seizure.  Discharged home with follow with PCP in 2 to 3 days.  Request PCP to monitor his blood pressure and BMP closely.    2.  Mildly elevated troponin.  No chest pain, EKG stable, troponin trend flat and and non ACS pattern.  Currently symptom-free, request PCP to arrange for outpatient echocardiogram.  81 mg aspirin added on discharge long with home dose statin.  3.  Influenza B infection.  Complete Tamiflu course.    4.  DM type II.  New home regimen..    5.  Dyslipidemia.  Continue home dose statin.   Discharge diagnosis     Principal Problem:   Syncope and collapse Active Problems:   DM2 (diabetes mellitus, type 2) ([place])   Essential hypertension   Influenza B    Discharge instructions    Discharge Instructions    Discharge instructions   Complete by:  As directed    Follow with Primary MD [name], MD in 3 days, get an outpatient echocardiogram scheduled.  Get CBC, CMP, 2 view Chest X ray -  checked  by Primary MD in 3 days.  Activity: As tolerated with Full fall precautions use walker/cane & assistance as needed  Disposition Home   Diet: Heart Healthy Low Carb.  Special Instructions: If you have smoked or chewed Tobacco  in the last 2 yrs please stop smoking, stop any regular Alcohol  and or any Recreational drug use.  On your next visit with your primary care physician please Get Medicines reviewed and adjusted.  Please request your Prim.MD to go over all Hospital Tests and Procedure/Radiological results at the follow up, please get all Hospital records sent to your Prim MD by signing hospital release before you  go home.  If you experience worsening of your admission symptoms, develop shortness of breath, life threatening emergency, suicidal or homicidal thoughts you must seek medical attention immediately by calling 911 or calling your MD immediately  if symptoms less severe.  You Must read complete instructions/literature along with all the possible adverse reactions/side effects for all the Medicines you take and that have been prescribed to you. Take any new Medicines after you have completely understood and accpet all the possible adverse reactions/side effects.   Increase activity slowly   Complete by:  As directed       Discharge Medications   Allergies as of 06/15/2018      Reactions   Fexofenadine Other (See Comments)   "dries me out too much"   Hydrochlorothiazide Other (See Comments)   REACTION: cramps   Loratadine Other (See Comments)   sleepy   Penicillins Hives   Pravastatin Sodium Other (See Comments)   REACTION: aches   Requip [ropinirole Hcl] Other (See Comments)   Bad dreams   Sudafed [pseudoephedrine] Other (See Comments)   dry      Medication List    STOP taking these medications   oxyCODONE-acetaminophen 10-325 MG tablet Commonly known as:  PERCOCET   PARoxetine 40 MG tablet Commonly known as:  PAXIL   sildenafil 100 MG tablet Commonly known as:  VIAGRA     TAKE these medications   aspirin 81 MG EC tablet Take 1 tablet (81 mg total) by mouth daily.   atenolol-chlorthalidone 100-25 MG tablet Commonly known as:  TENORETIC TAKE 1 TABLET BY MOUTH DAILY.   CHANTIX CONTINUING MONTH PAK 1 MG tablet Generic drug:  varenicline Take 1 mg by mouth 2 (two) times daily.   cholecalciferol 25 MCG (1000 UT) tablet Commonly known as:  VITAMIN D Take 1,000 Units by mouth daily.   DULoxetine 60 MG capsule Commonly known as:  CYMBALTA Take 60 mg by mouth daily.   Fish Oil Oil Take 1 tablet by mouth 2 (two) times daily.   glimepiride 2 MG tablet Commonly known  as:  AMARYL Take 1 tablet (2 mg total) by mouth daily before breakfast.   GLUCOSAMINE CHONDROITIN VIT D3 Caps Take 2 each by mouth daily.   JANUMET XR 50-1000 MG Tb24  Generic drug:  SitaGLIPtin-MetFORMIN HCl Take 1 tablet by mouth at bedtime.   lovastatin 20 MG tablet Commonly known as:  MEVACOR Take 20 mg by mouth at bedtime.   metFORMIN 1000 MG tablet Commonly known as:  GLUCOPHAGE TAKE 1 TABLET (1,000 MG TOTAL) BY MOUTH 2 (TWO) TIMES DAILY WITH A MEAL.   potassium chloride SA 20 MEQ tablet Commonly known as:  KLOR-CON M20 TAKE 1 TABLET (20 MEQ TOTAL) BY MOUTH DAILY. What changed:  See the new instructions.   quinapril 20 MG tablet Commonly known as:  ACCUPRIL TAKE 1 TABLET BY MOUTH EVERY DAY       Follow-up Information    [name], MD. Schedule an appointment as soon as possible for a visit in 3 day(s).   Specialty:  Family Medicine Contact information: [place] [address] [phone]           Major procedures and Radiology Reports - PLEASE review detailed and final reports thoroughly  -         Mr Brain Wo Contrast  Result Date: 06/14/2018 CLINICAL DATA:  Syncopal episode today. Flu symptoms for 2 days. History of hyperlipidemia, hypertension and diabetes. EXAM: MRI HEAD WITHOUT CONTRAST TECHNIQUE: Multiplanar, multiecho pulse sequences of the brain and surrounding structures were obtained without intravenous contrast. COMPARISON:  CT HEAD March 28, 2012. FINDINGS: INTRACRANIAL CONTENTS: No reduced diffusion to suggest acute ischemia. No susceptibility artifact to suggest hemorrhage. Symmetrically increased basal ganglia and to lesser extent side extension nigra susceptibility artifact, nonspecific. Few scattered subcentimeter supratentorial white matter FLAIR T2 hyperintensities. No parenchymal brain volume loss for age. No hydrocephalus. No suspicious parenchymal signal, masses, mass effect. No abnormal extra-axial fluid  collections. No extra-axial masses. VASCULAR: Normal major intracranial vascular flow voids present at skull base. SKULL AND UPPER CERVICAL SPINE: No abnormal sellar expansion. No suspicious calvarial bone marrow signal. Craniocervical junction maintained. SINUSES/ORBITS: Mild ethmoid mucosal thickening. Mastoid air cells are well aerated.The included ocular globes and orbital contents are non-suspicious. OTHER: None. IMPRESSION: 1. No acute intracranial process. 2. Mild chronic small vessel ischemic changes. 3. Nonspecific basal ganglia mineralization can be associated with neuro degenerative syndromes. Electronically Signed   By: Elon Alas M.D.   On: 06/14/2018 18:29    Micro Results     No results found for this or any previous visit (from the past 240 hour(s)).  Today   Subjective    Aaron Weaver today has no headache,no chest abdominal pain,no new weakness tingling or numbness, feels much better wants to go home today.    Objective   Blood pressure 116/75, pulse 70, temperature (!) 97.4 F (36.3 C), temperature source Oral, resp. rate 18, height 6' (1.829 m), weight 86.2 kg, SpO2 94 %.   Intake/Output Summary (Last 24 hours) at 06/15/2018 1126 Last data filed at 06/15/2018 0935 Gross per 24 hour  Intake 2703.18 ml  Output 1375 ml  Net 1328.18 ml    Exam Awake Alert, Oriented x 3, No new F.N deficits, Normal affect [place].AT,PERRAL Supple Neck,No JVD, No cervical lymphadenopathy appriciated.  Symmetrical Chest wall movement, Good air movement bilaterally, CTAB RRR,No Gallops,Rubs or new Murmurs, No Parasternal Heave +ve B.Sounds, Abd Soft, Non tender, No organomegaly appriciated, No rebound -guarding or rigidity. No Cyanosis, Clubbing or edema, No new Rash or bruise   Data Review   CBC w Diff:  Lab Results  Component Value Date   WBC 5.8 06/14/2018   HGB 13.8 06/14/2018   HCT  40.6 06/14/2018   PLT 118 (L) 06/14/2018   LYMPHOPCT 19 06/14/2018   MONOPCT 12 06/14/2018    EOSPCT 0 06/14/2018   BASOPCT 1 06/14/2018    CMP:  Lab Results  Component Value Date   NA 136 06/15/2018   K 3.9 06/15/2018   CL 99 06/15/2018   CO2 24 06/15/2018   BUN 28 (H) 06/15/2018   CREATININE 1.00 06/15/2018   PROT 8.0 06/14/2018   ALBUMIN 4.2 06/14/2018   BILITOT 1.1 06/14/2018   ALKPHOS 51 06/14/2018   AST 57 (H) 06/14/2018   ALT 60 (H) 06/14/2018  .   Total Time in preparing paper work, data evaluation and todays exam - 18 minutes  Lala Lund M.D on 06/15/2018 at 11:26 AM  [facility]   Office  [phone]

## 2018-06-15 NOTE — Progress Notes (Signed)
EEG was normal.   Overall clinical picture most consistent with syncope. Seizure is felt to be unlikely given clinical features militating in favor of an alternate diagnosis - syncope - including hypotension in the setting of dehydration, endorsement of presyncopal sensations prior to the spells and flu-like illness, as well as negative EEG.   Neurology will sign off.   Electronically signed: Dr. Kerney Elbe

## 2018-06-19 ENCOUNTER — Other Ambulatory Visit: Payer: Self-pay | Admitting: Family Medicine

## 2018-06-19 ENCOUNTER — Other Ambulatory Visit (HOSPITAL_COMMUNITY): Payer: Self-pay | Admitting: Family Medicine

## 2018-06-19 DIAGNOSIS — R55 Syncope and collapse: Secondary | ICD-10-CM

## 2018-06-19 DIAGNOSIS — R7989 Other specified abnormal findings of blood chemistry: Principal | ICD-10-CM

## 2018-06-19 DIAGNOSIS — R778 Other specified abnormalities of plasma proteins: Secondary | ICD-10-CM

## 2018-06-20 ENCOUNTER — Ambulatory Visit (HOSPITAL_COMMUNITY): Payer: BLUE CROSS/BLUE SHIELD | Attending: Cardiology

## 2018-06-20 DIAGNOSIS — R7989 Other specified abnormal findings of blood chemistry: Secondary | ICD-10-CM | POA: Diagnosis present

## 2018-06-20 DIAGNOSIS — R778 Other specified abnormalities of plasma proteins: Secondary | ICD-10-CM

## 2018-06-20 DIAGNOSIS — R55 Syncope and collapse: Secondary | ICD-10-CM | POA: Diagnosis present

## 2018-06-20 MED ORDER — PERFLUTREN LIPID MICROSPHERE
1.0000 mL | INTRAVENOUS | Status: AC | PRN
  Administered 2018-06-20: 2 mL via INTRAVENOUS

## 2018-07-08 ENCOUNTER — Other Ambulatory Visit: Payer: Self-pay | Admitting: Family Medicine

## 2018-07-08 DIAGNOSIS — S22080D Wedge compression fracture of T11-T12 vertebra, subsequent encounter for fracture with routine healing: Secondary | ICD-10-CM

## 2018-07-16 ENCOUNTER — Other Ambulatory Visit: Payer: Self-pay | Admitting: Orthopedic Surgery

## 2018-07-16 DIAGNOSIS — M8008XS Age-related osteoporosis with current pathological fracture, vertebra(e), sequela: Secondary | ICD-10-CM

## 2018-07-17 ENCOUNTER — Other Ambulatory Visit: Payer: BLUE CROSS/BLUE SHIELD

## 2018-07-17 ENCOUNTER — Ambulatory Visit
Admission: RE | Admit: 2018-07-17 | Discharge: 2018-07-17 | Disposition: A | Discharge status: Home / Self Care | Payer: BLUE CROSS/BLUE SHIELD | Source: Ambulatory Visit | Attending: Orthopedic Surgery | Admitting: Orthopedic Surgery

## 2018-07-17 ENCOUNTER — Other Ambulatory Visit: Payer: Self-pay

## 2018-07-17 DIAGNOSIS — M8008XS Age-related osteoporosis with current pathological fracture, vertebra(e), sequela: Secondary | ICD-10-CM

## 2018-07-24 ENCOUNTER — Other Ambulatory Visit: Payer: Self-pay | Admitting: Family Medicine

## 2018-07-24 DIAGNOSIS — R911 Solitary pulmonary nodule: Secondary | ICD-10-CM

## 2018-08-02 ENCOUNTER — Other Ambulatory Visit: Payer: Self-pay | Admitting: Family Medicine

## 2018-08-02 DIAGNOSIS — R911 Solitary pulmonary nodule: Secondary | ICD-10-CM

## 2018-08-07 ENCOUNTER — Other Ambulatory Visit: Payer: Self-pay

## 2018-08-07 ENCOUNTER — Ambulatory Visit
Admission: RE | Admit: 2018-08-07 | Discharge: 2018-08-07 | Disposition: A | Discharge status: Home / Self Care | Payer: BLUE CROSS/BLUE SHIELD | Source: Ambulatory Visit | Attending: Family Medicine | Admitting: Family Medicine

## 2018-08-07 DIAGNOSIS — R911 Solitary pulmonary nodule: Secondary | ICD-10-CM

## 2018-08-07 MED ORDER — IOPAMIDOL (ISOVUE-300) INJECTION 61%
75.0000 mL | Freq: Once | INTRAVENOUS | Status: AC | PRN
  Administered 2018-08-07: 09:00:00 75 mL via INTRAVENOUS

## 2018-08-15 ENCOUNTER — Other Ambulatory Visit: Payer: BLUE CROSS/BLUE SHIELD

## 2018-09-24 ENCOUNTER — Other Ambulatory Visit: Payer: BLUE CROSS/BLUE SHIELD

## 2018-11-06 ENCOUNTER — Other Ambulatory Visit: Payer: Self-pay

## 2018-11-06 ENCOUNTER — Ambulatory Visit
Admission: RE | Admit: 2018-11-06 | Discharge: 2018-11-06 | Disposition: A | Discharge status: Home / Self Care | Payer: BC Managed Care – PPO | Source: Ambulatory Visit | Attending: Family Medicine | Admitting: Family Medicine

## 2018-11-06 DIAGNOSIS — S22080D Wedge compression fracture of T11-T12 vertebra, subsequent encounter for fracture with routine healing: Secondary | ICD-10-CM

## 2019-05-01 DIAGNOSIS — R3915 Urgency of urination: Secondary | ICD-10-CM | POA: Diagnosis not present

## 2019-05-01 DIAGNOSIS — N5201 Erectile dysfunction due to arterial insufficiency: Secondary | ICD-10-CM | POA: Diagnosis not present

## 2019-05-12 DIAGNOSIS — Z20828 Contact with and (suspected) exposure to other viral communicable diseases: Secondary | ICD-10-CM | POA: Diagnosis not present

## 2019-05-23 DIAGNOSIS — N5201 Erectile dysfunction due to arterial insufficiency: Secondary | ICD-10-CM | POA: Diagnosis not present

## 2019-06-04 DIAGNOSIS — R52 Pain, unspecified: Secondary | ICD-10-CM | POA: Insufficient documentation

## 2019-06-04 DIAGNOSIS — M25531 Pain in right wrist: Secondary | ICD-10-CM | POA: Diagnosis not present

## 2019-06-04 DIAGNOSIS — M25331 Other instability, right wrist: Secondary | ICD-10-CM | POA: Insufficient documentation

## 2019-06-17 ENCOUNTER — Other Ambulatory Visit: Payer: Self-pay | Admitting: Orthopedic Surgery

## 2019-06-17 DIAGNOSIS — M25331 Other instability, right wrist: Secondary | ICD-10-CM

## 2019-07-07 DIAGNOSIS — N529 Male erectile dysfunction, unspecified: Secondary | ICD-10-CM | POA: Diagnosis not present

## 2019-07-07 DIAGNOSIS — E7800 Pure hypercholesterolemia, unspecified: Secondary | ICD-10-CM | POA: Diagnosis not present

## 2019-07-07 DIAGNOSIS — Z7984 Long term (current) use of oral hypoglycemic drugs: Secondary | ICD-10-CM | POA: Diagnosis not present

## 2019-07-07 DIAGNOSIS — R351 Nocturia: Secondary | ICD-10-CM | POA: Diagnosis not present

## 2019-07-07 DIAGNOSIS — F419 Anxiety disorder, unspecified: Secondary | ICD-10-CM | POA: Diagnosis not present

## 2019-07-07 DIAGNOSIS — F17201 Nicotine dependence, unspecified, in remission: Secondary | ICD-10-CM | POA: Diagnosis not present

## 2019-07-07 DIAGNOSIS — M545 Low back pain: Secondary | ICD-10-CM | POA: Diagnosis not present

## 2019-07-07 DIAGNOSIS — E1165 Type 2 diabetes mellitus with hyperglycemia: Secondary | ICD-10-CM | POA: Diagnosis not present

## 2019-07-07 DIAGNOSIS — I10 Essential (primary) hypertension: Secondary | ICD-10-CM | POA: Diagnosis not present

## 2019-07-09 ENCOUNTER — Other Ambulatory Visit: Payer: Self-pay

## 2019-07-09 ENCOUNTER — Ambulatory Visit
Admission: RE | Admit: 2019-07-09 | Discharge: 2019-07-09 | Disposition: A | Discharge status: Home / Self Care | Payer: PPO | Source: Ambulatory Visit | Attending: Orthopedic Surgery | Admitting: Orthopedic Surgery

## 2019-07-09 DIAGNOSIS — S62122A Displaced fracture of lunate [semilunar], left wrist, initial encounter for closed fracture: Secondary | ICD-10-CM | POA: Diagnosis not present

## 2019-07-09 DIAGNOSIS — M25531 Pain in right wrist: Secondary | ICD-10-CM | POA: Diagnosis not present

## 2019-07-09 DIAGNOSIS — M25331 Other instability, right wrist: Secondary | ICD-10-CM

## 2019-07-09 MED ORDER — IOPAMIDOL (ISOVUE-M 200) INJECTION 41%
2.0000 mL | Freq: Once | INTRAMUSCULAR | Status: AC
  Administered 2019-07-09: 2 mL via INTRA_ARTICULAR

## 2019-07-16 DIAGNOSIS — S62034A Nondisplaced fracture of proximal third of navicular [scaphoid] bone of right wrist, initial encounter for closed fracture: Secondary | ICD-10-CM | POA: Diagnosis not present

## 2019-07-16 DIAGNOSIS — S62034D Nondisplaced fracture of proximal third of navicular [scaphoid] bone of right wrist, subsequent encounter for fracture with routine healing: Secondary | ICD-10-CM | POA: Diagnosis not present

## 2019-07-16 DIAGNOSIS — R52 Pain, unspecified: Secondary | ICD-10-CM | POA: Diagnosis not present

## 2019-07-16 DIAGNOSIS — M25331 Other instability, right wrist: Secondary | ICD-10-CM | POA: Diagnosis not present

## 2019-07-16 DIAGNOSIS — S62121D Displaced fracture of lunate [semilunar], right wrist, subsequent encounter for fracture with routine healing: Secondary | ICD-10-CM | POA: Diagnosis not present

## 2019-07-16 DIAGNOSIS — M25531 Pain in right wrist: Secondary | ICD-10-CM | POA: Diagnosis not present

## 2019-07-16 DIAGNOSIS — S62121A Displaced fracture of lunate [semilunar], right wrist, initial encounter for closed fracture: Secondary | ICD-10-CM | POA: Diagnosis not present

## 2019-08-22 DIAGNOSIS — S62034A Nondisplaced fracture of proximal third of navicular [scaphoid] bone of right wrist, initial encounter for closed fracture: Secondary | ICD-10-CM | POA: Insufficient documentation

## 2019-08-22 DIAGNOSIS — M25331 Other instability, right wrist: Secondary | ICD-10-CM | POA: Diagnosis not present

## 2019-08-22 DIAGNOSIS — S62121A Displaced fracture of lunate [semilunar], right wrist, initial encounter for closed fracture: Secondary | ICD-10-CM | POA: Insufficient documentation

## 2019-08-22 DIAGNOSIS — M25531 Pain in right wrist: Secondary | ICD-10-CM | POA: Diagnosis not present

## 2019-08-26 DIAGNOSIS — L729 Follicular cyst of the skin and subcutaneous tissue, unspecified: Secondary | ICD-10-CM | POA: Diagnosis not present

## 2019-09-04 ENCOUNTER — Encounter (HOSPITAL_BASED_OUTPATIENT_CLINIC_OR_DEPARTMENT_OTHER): Payer: Self-pay | Admitting: Orthopedic Surgery

## 2019-09-04 ENCOUNTER — Other Ambulatory Visit: Payer: Self-pay

## 2019-09-05 DIAGNOSIS — E119 Type 2 diabetes mellitus without complications: Secondary | ICD-10-CM | POA: Diagnosis not present

## 2019-09-05 DIAGNOSIS — I10 Essential (primary) hypertension: Secondary | ICD-10-CM | POA: Diagnosis not present

## 2019-09-05 DIAGNOSIS — E7800 Pure hypercholesterolemia, unspecified: Secondary | ICD-10-CM | POA: Diagnosis not present

## 2019-09-05 DIAGNOSIS — E1165 Type 2 diabetes mellitus with hyperglycemia: Secondary | ICD-10-CM | POA: Diagnosis not present

## 2019-09-05 DIAGNOSIS — J439 Emphysema, unspecified: Secondary | ICD-10-CM | POA: Diagnosis not present

## 2019-09-05 DIAGNOSIS — M8580 Other specified disorders of bone density and structure, unspecified site: Secondary | ICD-10-CM | POA: Diagnosis not present

## 2019-09-05 DIAGNOSIS — N401 Enlarged prostate with lower urinary tract symptoms: Secondary | ICD-10-CM | POA: Diagnosis not present

## 2019-09-08 ENCOUNTER — Other Ambulatory Visit (HOSPITAL_COMMUNITY)
Admission: RE | Admit: 2019-09-08 | Discharge: 2019-09-08 | Disposition: A | Discharge status: Home / Self Care | Payer: PPO | Source: Ambulatory Visit | Attending: Orthopedic Surgery | Admitting: Orthopedic Surgery

## 2019-09-08 ENCOUNTER — Encounter (HOSPITAL_BASED_OUTPATIENT_CLINIC_OR_DEPARTMENT_OTHER)
Admission: RE | Admit: 2019-09-08 | Discharge: 2019-09-08 | Disposition: A | Discharge status: Home / Self Care | Payer: PPO | Source: Ambulatory Visit | Attending: Orthopedic Surgery | Admitting: Orthopedic Surgery

## 2019-09-08 DIAGNOSIS — Z20822 Contact with and (suspected) exposure to covid-19: Secondary | ICD-10-CM | POA: Diagnosis not present

## 2019-09-08 DIAGNOSIS — Z01818 Encounter for other preprocedural examination: Secondary | ICD-10-CM | POA: Insufficient documentation

## 2019-09-08 DIAGNOSIS — I493 Ventricular premature depolarization: Secondary | ICD-10-CM | POA: Insufficient documentation

## 2019-09-08 LAB — BASIC METABOLIC PANEL
Anion gap: 8 (ref 5–15)
BUN: 31 mg/dL — ABNORMAL HIGH (ref 8–23)
CO2: 23 mmol/L (ref 22–32)
Calcium: 9 mg/dL (ref 8.9–10.3)
Chloride: 107 mmol/L (ref 98–111)
Creatinine, Ser: 1.24 mg/dL (ref 0.61–1.24)
GFR calc Af Amer: >60 mL/min (ref >60)
GFR calc non Af Amer: >60 mL/min (ref >60)
Glucose, Bld: 221 mg/dL — ABNORMAL HIGH (ref 70–99)
Potassium: 5.1 mmol/L (ref 3.5–5.1)
Sodium: 138 mmol/L (ref 135–145)

## 2019-09-08 LAB — SARS CORONAVIRUS 2 (TAT 6-24 HRS): SARS Coronavirus 2: NEGATIVE

## 2019-09-10 ENCOUNTER — Other Ambulatory Visit: Payer: Self-pay | Admitting: Orthopedic Surgery

## 2019-09-11 ENCOUNTER — Ambulatory Visit (HOSPITAL_BASED_OUTPATIENT_CLINIC_OR_DEPARTMENT_OTHER): Payer: PPO | Admitting: Certified Registered Nurse Anesthetist

## 2019-09-11 ENCOUNTER — Ambulatory Visit (HOSPITAL_BASED_OUTPATIENT_CLINIC_OR_DEPARTMENT_OTHER)
Admission: RE | Admit: 2019-09-11 | Discharge: 2019-09-11 | Disposition: A | Discharge status: Home / Self Care | Payer: PPO | Attending: Orthopedic Surgery | Admitting: Orthopedic Surgery

## 2019-09-11 ENCOUNTER — Encounter (HOSPITAL_BASED_OUTPATIENT_CLINIC_OR_DEPARTMENT_OTHER): Payer: Self-pay | Admitting: Orthopedic Surgery

## 2019-09-11 ENCOUNTER — Other Ambulatory Visit: Payer: Self-pay

## 2019-09-11 ENCOUNTER — Encounter (HOSPITAL_BASED_OUTPATIENT_CLINIC_OR_DEPARTMENT_OTHER)
Admission: RE | Disposition: A | Discharge status: Home / Self Care | Payer: Self-pay | Source: Home / Self Care | Attending: Orthopedic Surgery

## 2019-09-11 DIAGNOSIS — Z87891 Personal history of nicotine dependence: Secondary | ICD-10-CM | POA: Diagnosis not present

## 2019-09-11 DIAGNOSIS — S63591A Other specified sprain of right wrist, initial encounter: Secondary | ICD-10-CM | POA: Diagnosis not present

## 2019-09-11 DIAGNOSIS — I10 Essential (primary) hypertension: Secondary | ICD-10-CM | POA: Insufficient documentation

## 2019-09-11 DIAGNOSIS — E785 Hyperlipidemia, unspecified: Secondary | ICD-10-CM | POA: Diagnosis not present

## 2019-09-11 DIAGNOSIS — Z79899 Other long term (current) drug therapy: Secondary | ICD-10-CM | POA: Insufficient documentation

## 2019-09-11 DIAGNOSIS — M25531 Pain in right wrist: Secondary | ICD-10-CM | POA: Diagnosis not present

## 2019-09-11 DIAGNOSIS — E119 Type 2 diabetes mellitus without complications: Secondary | ICD-10-CM | POA: Diagnosis not present

## 2019-09-11 DIAGNOSIS — S63511A Sprain of carpal joint of right wrist, initial encounter: Secondary | ICD-10-CM | POA: Diagnosis not present

## 2019-09-11 DIAGNOSIS — M931 Kienbock's disease of adults: Secondary | ICD-10-CM | POA: Insufficient documentation

## 2019-09-11 DIAGNOSIS — S61511A Laceration without foreign body of right wrist, initial encounter: Secondary | ICD-10-CM | POA: Diagnosis not present

## 2019-09-11 HISTORY — PX: CARPECTOMY: SHX5004

## 2019-09-11 LAB — GLUCOSE, CAPILLARY
Glucose-Capillary: 105 mg/dL — ABNORMAL HIGH (ref 70–99)
Glucose-Capillary: 147 mg/dL — ABNORMAL HIGH (ref 70–99)

## 2019-09-11 SURGERY — CARPECTOMY
Anesthesia: Regional | Site: Hand | Laterality: Right

## 2019-09-11 MED ORDER — VANCOMYCIN HCL IN DEXTROSE 1-5 GM/200ML-% IV SOLN
INTRAVENOUS | Status: AC
  Filled 2019-09-11: qty 200

## 2019-09-11 MED ORDER — FENTANYL CITRATE (PF) 100 MCG/2ML IJ SOLN
50.0000 ug | INTRAMUSCULAR | Status: DC | PRN
  Administered 2019-09-11: 100 ug via INTRAVENOUS

## 2019-09-11 MED ORDER — VANCOMYCIN HCL IN DEXTROSE 1-5 GM/200ML-% IV SOLN
1000.0000 mg | INTRAVENOUS | Status: AC
  Administered 2019-09-11: 09:00:00 1000 mg via INTRAVENOUS

## 2019-09-11 MED ORDER — PROPOFOL 500 MG/50ML IV EMUL
INTRAVENOUS | Status: DC | PRN
  Administered 2019-09-11: 40 ug/kg/min via INTRAVENOUS

## 2019-09-11 MED ORDER — LIDOCAINE HCL (CARDIAC) PF 100 MG/5ML IV SOSY
PREFILLED_SYRINGE | INTRAVENOUS | Status: DC | PRN
  Administered 2019-09-11: 20 mg via INTRAVENOUS

## 2019-09-11 MED ORDER — OXYCODONE-ACETAMINOPHEN 7.5-325 MG PO TABS: 1.0000 | ORAL_TABLET | ORAL | 0 refills | Status: DC | PRN

## 2019-09-11 MED ORDER — MIDAZOLAM HCL 2 MG/2ML IJ SOLN
INTRAMUSCULAR | Status: AC
  Filled 2019-09-11: qty 2

## 2019-09-11 MED ORDER — CLONIDINE HCL (ANALGESIA) 100 MCG/ML EP SOLN
EPIDURAL | Status: DC | PRN
  Administered 2019-09-11: 80 ug

## 2019-09-11 MED ORDER — MIDAZOLAM HCL 2 MG/2ML IJ SOLN
1.0000 mg | INTRAMUSCULAR | Status: DC | PRN
  Administered 2019-09-11: 1 mg via INTRAVENOUS

## 2019-09-11 MED ORDER — LIDOCAINE 2% (20 MG/ML) 5 ML SYRINGE
INTRAMUSCULAR | Status: AC
  Filled 2019-09-11: qty 5

## 2019-09-11 MED ORDER — FENTANYL CITRATE (PF) 100 MCG/2ML IJ SOLN
INTRAMUSCULAR | Status: AC
  Filled 2019-09-11: qty 2

## 2019-09-11 MED ORDER — ONDANSETRON HCL 4 MG/2ML IJ SOLN
INTRAMUSCULAR | Status: AC
  Filled 2019-09-11: qty 2

## 2019-09-11 MED ORDER — ONDANSETRON HCL 4 MG/2ML IJ SOLN
INTRAMUSCULAR | Status: DC | PRN
  Administered 2019-09-11: 4 mg via INTRAVENOUS

## 2019-09-11 MED ORDER — PROPOFOL 500 MG/50ML IV EMUL
INTRAVENOUS | Status: AC
  Filled 2019-09-11: qty 50

## 2019-09-11 MED ORDER — LIDOCAINE HCL (CARDIAC) PF 100 MG/5ML IV SOSY
PREFILLED_SYRINGE | INTRAVENOUS | Status: DC | PRN
Start: 2019-09-11 — End: 2019-09-11
  Administered 2019-09-11: 10 mL via INTRAVENOUS

## 2019-09-11 MED ORDER — LACTATED RINGERS IV SOLN: INTRAVENOUS | Status: DC

## 2019-09-11 MED ORDER — PROPOFOL 10 MG/ML IV BOLUS
INTRAVENOUS | Status: DC | PRN
  Administered 2019-09-11: 40 mg via INTRAVENOUS
  Administered 2019-09-11 (×2): 20 mg via INTRAVENOUS

## 2019-09-11 MED ORDER — BUPIVACAINE HCL 0.5 % IJ SOLN
INTRAMUSCULAR | Status: DC | PRN
Start: 2019-09-11 — End: 2019-09-11
  Administered 2019-09-11: 30 mL

## 2019-09-11 MED ORDER — PROPOFOL 10 MG/ML IV BOLUS
INTRAVENOUS | Status: AC
  Filled 2019-09-11: qty 20

## 2019-09-11 SURGICAL SUPPLY — 56 items
APL PRP STRL LF DISP 70% ISPRP (MISCELLANEOUS) ×1
BLADE ARTHRO LOK 4 BEAVER (BLADE) ×2 IMPLANT
BLADE MINI RND TIP GREEN BEAV (BLADE) ×2 IMPLANT
BLADE SURG 15 STRL LF DISP TIS (BLADE) ×1 IMPLANT
BLADE SURG 15 STRL SS (BLADE) ×2
BNDG CMPR 9X4 STRL LF SNTH (GAUZE/BANDAGES/DRESSINGS) ×1
BNDG COHESIVE 3X5 TAN STRL LF (GAUZE/BANDAGES/DRESSINGS) ×2 IMPLANT
BNDG ESMARK 4X9 LF (GAUZE/BANDAGES/DRESSINGS) ×2 IMPLANT
BNDG GAUZE ELAST 4 BULKY (GAUZE/BANDAGES/DRESSINGS) ×2 IMPLANT
CHLORAPREP W/TINT 26 (MISCELLANEOUS) ×2 IMPLANT
CORD BIPOLAR FORCEPS 12FT (ELECTRODE) ×2 IMPLANT
COVER BACK TABLE 60X90IN (DRAPES) ×2 IMPLANT
COVER MAYO STAND STRL (DRAPES) ×2 IMPLANT
COVER WAND RF STERILE (DRAPES) IMPLANT
CUFF TOURN SGL QUICK 18X4 (TOURNIQUET CUFF) IMPLANT
DECANTER SPIKE VIAL GLASS SM (MISCELLANEOUS) IMPLANT
DRAPE EXTREMITY T 121X128X90 (DISPOSABLE) ×2 IMPLANT
DRAPE OEC MINIVIEW 54X84 (DRAPES) ×2 IMPLANT
DRAPE SURG 17X23 STRL (DRAPES) ×2 IMPLANT
GAUZE SPONGE 4X4 12PLY STRL (GAUZE/BANDAGES/DRESSINGS) ×2 IMPLANT
GAUZE XEROFORM 1X8 LF (GAUZE/BANDAGES/DRESSINGS) ×2 IMPLANT
GLOVE BIO SURGEON STRL SZ 6.5 (GLOVE) ×1 IMPLANT
GLOVE BIO SURGEON STRL SZ7.5 (GLOVE) ×1 IMPLANT
GLOVE BIOGEL PI IND STRL 7.0 (GLOVE) IMPLANT
GLOVE BIOGEL PI IND STRL 8 (GLOVE) ×1 IMPLANT
GLOVE BIOGEL PI IND STRL 8.5 (GLOVE) ×1 IMPLANT
GLOVE BIOGEL PI INDICATOR 7.0 (GLOVE) ×2
GLOVE BIOGEL PI INDICATOR 8 (GLOVE) ×2
GLOVE BIOGEL PI INDICATOR 8.5 (GLOVE) ×1
GLOVE ECLIPSE 6.5 STRL STRAW (GLOVE) ×2 IMPLANT
GLOVE SURG ORTHO 8.0 STRL STRW (GLOVE) ×2 IMPLANT
GOWN STRL REUS W/ TWL LRG LVL3 (GOWN DISPOSABLE) ×1 IMPLANT
GOWN STRL REUS W/TWL LRG LVL3 (GOWN DISPOSABLE) ×2
GOWN STRL REUS W/TWL XL LVL3 (GOWN DISPOSABLE) ×3 IMPLANT
NS IRRIG 1000ML POUR BTL (IV SOLUTION) ×2 IMPLANT
PAD CAST 3X4 CTTN HI CHSV (CAST SUPPLIES) ×1 IMPLANT
PADDING CAST ABS 3INX4YD NS (CAST SUPPLIES) ×1
PADDING CAST ABS 4INX4YD NS (CAST SUPPLIES) ×1
PADDING CAST ABS COTTON 3X4 (CAST SUPPLIES) IMPLANT
PADDING CAST ABS COTTON 4X4 ST (CAST SUPPLIES) ×1 IMPLANT
PADDING CAST COTTON 3X4 STRL (CAST SUPPLIES) ×2
SET BASIN DAY SURGERY F.S. (CUSTOM PROCEDURE TRAY) ×2 IMPLANT
SLEEVE SCD COMPRESS KNEE MED (MISCELLANEOUS) ×2 IMPLANT
SPLINT PLASTER CAST XFAST 3X15 (CAST SUPPLIES) IMPLANT
SPLINT PLASTER XTRA FASTSET 3X (CAST SUPPLIES) ×20
STOCKINETTE 4X48 STRL (DRAPES) ×2 IMPLANT
SUT ETHILON 4 0 PS 2 18 (SUTURE) ×2 IMPLANT
SUT VIC AB 0 CT1 27 (SUTURE)
SUT VIC AB 0 CT1 27XBRD ANBCTR (SUTURE) IMPLANT
SUT VIC AB 2-0 SH 27 (SUTURE)
SUT VIC AB 2-0 SH 27XBRD (SUTURE) IMPLANT
SUT VICRYL 4-0 PS2 18IN ABS (SUTURE) ×2 IMPLANT
SYR BULB EAR ULCER 3OZ GRN STR (SYRINGE) ×2 IMPLANT
SYR CONTROL 10ML LL (SYRINGE) IMPLANT
TOWEL GREEN STERILE FF (TOWEL DISPOSABLE) ×2 IMPLANT
UNDERPAD 30X36 HEAVY ABSORB (UNDERPADS AND DIAPERS) ×2 IMPLANT

## 2019-09-11 NOTE — H&P (Signed)
Aaron Fee Sr. is an 64 y.o. male.   Chief Complaint: right wrist pain Aaron Weaver is a 64 yo old right-hand-dominant male comes in complaining of pain in his right wrist. He states he was moving logs to the splint. When he felt a pop in his right wrist. This occurred approximately a month ago. Is no prior history of injury. He complains of sharp pain VAS score of 6/10 with any use of his hand. He has tried taking Tylenol ibuprofen which has given him some relief. He has been wearing his splint. Pushing or pulling increases his pain for him. This is localized over the entire proximal row area. And some radiation proximally. He has no prior history of injury. He has a history of diabetes arthritis no history of thyroid problems or gout. Family history is positive diabetes negative for the remainder.  He was referred for MRI at that time. He was noted to have cystic lesions and in the scaphoid lunate into question. MRI has been done and read out by Dr. Katherine Basset. This reveals fracture of the lunate torn scapholunate ligament nondisplaced fracture of the proximal margin proximal pole of the scaphoid with neck singles in the scaphoid lunate. He states that his splint has significantly helped symptoms but he continues to complain of pain and loss of mobility.He continues to have moderate to severe pain with any used despite wearing his splint. He has retired. He states he would like to proceed have something done to help with his pain. His MRI revealed fractures of both the scaphoid lunate and scapholunate dissociation. He has failed conservative treatment.    Past Medical History:  Diagnosis Date  . Acute sinusitis, unspecified   . Allergic rhinitis   . Allergy   . Anxiety state, unspecified   . Corns and callosities    toe  . Cough   . Cramp of limb    legs  . Depression   . Impacted cerumen   . Impotence of organic origin   . Lumbago   . Other and unspecified hyperlipidemia   . Routine  general medical examination at a health care facility   . Tobacco use disorder   . Type II or unspecified type diabetes mellitus without mention of complication, not stated as uncontrolled    type II  . Unspecified essential hypertension     Past Surgical History:  Procedure Laterality Date  . COLONOSCOPY    . INGUINAL HERNIA REPAIR     right  . POLYPECTOMY      Family History  Problem Relation Age of Onset  . Cancer Mother        colon  . Colon cancer Mother 62  . Hypertension Father   . Coronary artery disease Other        male<60 and male<50  1st degree relative  . Colon polyps Brother   . Esophageal cancer Neg Hx   . Rectal cancer Neg Hx   . Stomach cancer Neg Hx    Social History:  reports that he has quit smoking. His smoking use included cigarettes. He smoked 0.50 packs per day for 0.00 years. He has never used smokeless tobacco. He reports that he does not drink alcohol or use drugs.  Allergies:  Allergies  Allergen Reactions  . Fexofenadine Other (See Comments)    "dries me out too much"  . Hydrochlorothiazide Other (See Comments)    REACTION: cramps  . Loratadine Other (See Comments)    sleepy  .  Penicillins Hives  . Pravastatin Sodium Other (See Comments)    REACTION: aches  . Requip [Ropinirole Hcl] Other (See Comments)    Bad dreams  . Sudafed [Pseudoephedrine] Other (See Comments)    dry    No medications prior to admission.    No results found for this or any previous visit (from the past 48 hour(s)).  No results found.   Pertinent items are noted in HPI.  Height 6' (1.829 m), weight 86.2 kg.  General appearance: alert, cooperative and appears stated age Head: Normocephalic, without obvious abnormality Neck: no JVD Resp: clear to auscultation bilaterally Cardio: regular rate and rhythm, S1, S2 normal, no murmur, click, rub or gallop GI: soft, non-tender; bowel sounds normal; no masses,  no organomegaly Extremities: right wrist  pain Pulses: 2+ and symmetric Skin: Skin color, texture, turgor normal. 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  Plan: He would like to proceed to have this surgically corrected. We have reviewed his x-rays with him. We recommend proximal carpectomy radial styloidectomy and PIN resection for pain. Preperi-and postoperative course been discussed along with risks and complications. He is aware that there is no guarantee to the surgery the possibility of infection recurrence injury to arteries nerves tendons complete relief symptoms just possibility of dislocation depending on integrity of the remaining ligaments. He would like to proceed. He is scheduled for proximal carpectomy with radial styloidectomy and posterior interosseous nerve resection right wrist as an outpatient under regional anesthesia.   Daryll Brod 09/11/2019, 5:08 AM

## 2019-09-11 NOTE — Op Note (Signed)
I assisted Surgeon(s) and Role:    * Daryll Brod, MD - Primary    Leanora Cover, MD on the Procedure(s): PROXIMAL ROW CARPECTOMY; RADIAL STYLOIDECTOMY; POSTERIOR INTEROSSIUS NERVE RESECTION on 09/11/2019.  I provided assistance on this case as follows: retraction soft tissues, positioning of wrist.  Electronically signed by: Leanora Cover, MD Date: 09/11/2019 Time: 10:01 AM

## 2019-09-11 NOTE — Anesthesia Preprocedure Evaluation (Signed)
Anesthesia Evaluation  Patient identified by MRN, date of birth, ID band Patient awake    Reviewed: Allergy & Precautions, NPO status , Patient's Chart, lab work & pertinent test results, reviewed documented beta blocker date and time   Airway Mallampati: II  TM Distance: >3 FB Neck ROM: Full    Dental  (+) Dental Advisory Given   Pulmonary neg pulmonary ROS, former smoker,    Pulmonary exam normal breath sounds clear to auscultation       Cardiovascular hypertension, Pt. on medications and Pt. on home beta blockers Normal cardiovascular exam Rhythm:Regular Rate:Normal  Echo 06/2018 1. The left ventricle has normal systolic function, with an ejection fraction of 55-60%. The cavity size was normal. Left ventricular diastolic Doppler parameters are consistent with impaired relaxation No evidence of  left ventricular regional wall motion abnormalities.  2. The right ventricle has normal systolic function. The cavity was normal. There is no increase in right ventricular wall thickness.  3. The mitral valve is normal in structure. No evidence of mitral valve stenosis. No regurgitation.  4. The tricuspid valve is normal in structure.  5. The aortic valve is tricuspid There is mild calcification of the aortic valve. Aortic valve regurgitation is trivial by color flow Doppler. No stenosis.  6. The aortic root and ascending aorta are normal in size and structure.  7. No evidence of left ventricular regional wall motion abnormalities.  8. No complete TR doppler jet so unable to estimate PA systolic pressure.    Neuro/Psych PSYCHIATRIC DISORDERS Anxiety Depression negative neurological ROS     GI/Hepatic negative GI ROS, Neg liver ROS,   Endo/Other  diabetes  Renal/GU negative Renal ROS     Musculoskeletal negative musculoskeletal ROS (+)   Abdominal   Peds  Hematology negative hematology ROS (+)   Anesthesia Other  Findings   Reproductive/Obstetrics                             Anesthesia Physical Anesthesia Plan  ASA: II  Anesthesia Plan: Regional   Post-op Pain Management:    Induction: Intravenous  PONV Risk Score and Plan: 1 and Propofol infusion, TIVA and Treatment may vary due to age or medical condition  Airway Management Planned: Natural Airway  Additional Equipment: None  Intra-op Plan:   Post-operative Plan:   Informed Consent: I have reviewed the patients History and Physical, chart, labs and discussed the procedure including the risks, benefits and alternatives for the proposed anesthesia with the patient or authorized representative who has indicated his/her understanding and acceptance.     Dental advisory given  Plan Discussed with: CRNA  Anesthesia Plan Comments:         Anesthesia Quick Evaluation

## 2019-09-11 NOTE — Transfer of Care (Signed)
Immediate Anesthesia Transfer of Care Note  Patient: Aaron BOULDIN Sr.  Procedure(s) Performed: PROXIMAL ROW CARPECTOMY; RADIAL STYLOIDECTOMY; POSTERIOR INTEROSSIUS NERVE RESECTION (Right Hand)  Patient Location: PACU  Anesthesia Type:MAC combined with regional for post-op pain  Level of Consciousness: awake, alert , oriented and patient cooperative  Airway & Oxygen Therapy: Patient Spontanous Breathing and Patient connected to face mask oxygen  Post-op Assessment: Report given to RN and Post -op Vital signs reviewed and stable  Post vital signs: Reviewed and stable  Last Vitals:  Vitals Value Taken Time  BP    Temp    Pulse    Resp    SpO2      Last Pain:  Vitals:   09/11/19 0718  TempSrc: Tympanic  PainSc: 0-No pain         Complications: No apparent anesthesia complications

## 2019-09-11 NOTE — Op Note (Signed)
NAME: CHAS BRANAN Sr. 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   ASSISTANT: Leanora Cover, MD   ANESTHESIA:  Regional with sedation   INTRAVENOUS FLUIDS:  Per anesthesia flow sheet.   ESTIMATED BLOOD LOSS:  Minimal.   COMPLICATIONS:  None.   SPECIMENS:   Lunate   TOURNIQUET TIME:    Total Tourniquet Time Documented: Upper Arm (Right) - 56 minutes Total: Upper Arm (Right) - 56 minutes    DISPOSITION:  Stable to PACU.   INDICATIONS: Patient is a 64 year old male with a long history of pain in his wrist.  This is not responded to conservative treatment.  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  He is desirous of proceeding to have a proximal carpectomy done in an effort to relieve his pain along with resection of the posterior interosseous nerve and radial styloidectomy.  Preperi-and postoperative course been discussed along with risks and complications.  He is aware that there is no guarantee to the surgery the possibility of infection recurrence injury to arteries nerves tendons complete relief symptoms dystrophy.  The possibility of further degeneration with time.  The preoperative area the patient seen the extremity marked by both patient and surgeon antibiotic given a supraclavicular block was carried out without difficulty under the direction the anesthesia department.  OPERATIVE COURSE: He was brought to the operating room placed in  the supine position prepped with ChloraPrep with the right arm free.  3-minute dry time was allowed and timeout taken confirming patient procedure.  The limb was exsanguinated with an Esmarch bandage turn placed on the arm was inflated to 250 mmHg.  A longitudinal incision was made over the right distal radius and dorsal of his hand in line just ulnar to the Lister's tubercle carried down through subcutaneous tissue.  Bleeders were electrocauterized with bipolar.  Neuro back structures were protected.  The extensor retinaculum was then released.  The third dorsal compartment was also released.  This allowed the tendons to be retracted away from the capsule and a longitudinal incision was made approximately half centimeter distal to the attachment to the distal radius.  The posterior interosseous nerve was identified.  This was isolated proximally cauterized proximally and approximately 2-1/2cm posterior interosseous nerve was then removed without difficulty.  This was done for preoperative pain.  2 flaps were created by team this placed proximally.  This allowed visualization of the proximal capitate scaphoid lunate ligament the lunate was fractured.  This was in at least 3 pieces.  The scaphoid was then isolated along with the triquetrum with sharp dissection.  The proximal capitate did not show significant wear.  The scaphoid was then removed piecemeal by osteotomized and caused its waist.  This allowed removal of the proximal aspect and distal aspect with blunt sharp dissection using rongeurs elevators and Beaver blades.  Attention was then passed to the ulnar side of the triquetrum was also isolated with blunt sharp dissection.  This was then removed after incising the lunotriquetral ligament.  This  was removed with a rondure without difficulty.  The lunate was attended to next.  The lunate was attended to next.  A large towel clip was placed into the lunate bone.  This allowed it to be rotated and a curved  Beaver blade was then used to subperiosteally remove it leaving the volar ligaments intact.  The scapho capitate ligament was left intact.  A styloidectomy was then performed after isolating the styloid with the elevators and this was removed with a rondure.  The wound was copiously irrigated with saline.  X-rays AP lateral revealed that the capitate translocated proximally to the lunate fossa.  The capsule was closed with 2-0 Mersilene sutures overlapping the 2 flaps securing the dorsal capsule.  The extensor retinaculum was then repaired with interrupted 4-0 Vicryl sutures after translocating the EPL tendon dorsal to it believing it from Lister's tubercle.  The subcutaneous tissue was closed interrupted 4-0 Vicryl and the skin with interrupted 4-0 nylon sutures.  A sterile compressive dressing dorsal palmar thumb spica splint applied.  X-rays taken AP lateral direction revealed that the capitate resided in the lunate fossa.  The tourniquet was deflated.  Patient tolerated the procedure well was taken to the recovery room for observation in satisfactory condition.  He will be discharged home to return [facility] on Percocet 7.5 325's.   Daryll Brod, MD Electronically signed, 09/11/19

## 2019-09-11 NOTE — Anesthesia Postprocedure Evaluation (Signed)
Anesthesia Post Note  Patient: Aaron HUTCHINGS Sr.  Procedure(s) Performed: PROXIMAL ROW CARPECTOMY; RADIAL STYLOIDECTOMY; POSTERIOR INTEROSSIUS NERVE RESECTION (Right Hand)     Patient location during evaluation: PACU Anesthesia Type: Regional Level of consciousness: awake and alert Pain management: pain level controlled Vital Signs Assessment: post-procedure vital signs reviewed and stable Respiratory status: spontaneous breathing Cardiovascular status: stable Anesthetic complications: no    Last Vitals:  Vitals:   09/11/19 1030 09/11/19 1045  BP: 120/72 (!) 115/59  Pulse:  70  Resp:  16  Temp:  36.4 C  SpO2:  97%    Last Pain:  Vitals:   09/11/19 1045  TempSrc:   PainSc: 0-No pain                 Nolon Nations

## 2019-09-11 NOTE — Brief Op Note (Signed)
09/11/2019  10:07 AM  PATIENT:  Aaron Fee Sr.  64 y.o. male  PRE-OPERATIVE DIAGNOSIS:  SCAPHOLUNATE TEAR RIGHT WRIST  POST-OPERATIVE DIAGNOSIS:  SCAPHOLUNATE TEAR RIGHT WRIST  PROCEDURE:  Procedure(s) with comments: PROXIMAL ROW CARPECTOMY; RADIAL STYLOIDECTOMY; POSTERIOR INTEROSSIUS NERVE RESECTION (Right) - AXILLARY BLOCK  SURGEON:  Surgeon(s) and Role:    * Daryll Brod, MD - Primary    * Leanora Cover, MD    ASSISTANTS: {K Lois Ostrom,MD  ANESTHESIA:   regional and IV sedation  EBL:  9ml BLOOD ADMINISTERED:none  DRAINS: none   LOCAL MEDICATIONS USED:  NONE  SPECIMEN:  Excision  DISPOSITION OF SPECIMEN:  PATHOLOGY  COUNTS:  YES  TOURNIQUET:   Total Tourniquet Time Documented: Upper Arm (Right) - 56 minutes Total: Upper Arm (Right) - 56 minutes   DICTATION: .Viviann Spare Dictation  PLAN OF CARE: Discharge to home after PACU  PATIENT DISPOSITION:  PACU - hemodynamically stable.

## 2019-09-11 NOTE — Discharge Instructions (Addendum)
Post Anesthesia Home Care Instructions  Activity: Get plenty of rest for the remainder of the day. A responsible individual must stay with you for 24 hours following the procedure.  For the next 24 hours, DO NOT: -Drive a car -Paediatric nurse -Drink alcoholic beverages -Take any medication unless instructed by your physician -Make any legal decisions or sign important papers.  Meals: Start with liquid foods such as gelatin or soup. Progress to regular foods as tolerated. Avoid greasy, spicy, heavy foods. If nausea and/or vomiting occur, drink only clear liquids until the nausea and/or vomiting subsides. Call your physician if vomiting continues.  Special Instructions/Symptoms: Your throat may feel dry or sore from the anesthesia or the breathing tube placed in your throat during surgery. If this causes discomfort, gargle with warm salt water. The discomfort should disappear within 24 hours.  If you had a scopolamine patch placed behind your ear for the management of post- operative nausea and/or vomiting:  1. The medication in the patch is effective for 72 hours, after which it should be removed.  Wrap patch in a tissue and discard in the trash. Wash hands thoroughly with soap and water. 2. You may remove the patch earlier than 72 hours if you experience unpleasant side effects which may include dry mouth, dizziness or visual disturbances. 3. Avoid touching the patch. Wash your hands with soap and water after contact with the patch.  Regional Anesthesia Blocks  1. Numbness or the inability to move the "blocked" extremity may last from 3-48 hours after placement. The length of time depends on the medication injected and your individual response to the medication. If the numbness is not going away after 48 hours, call your surgeon.  2. The extremity that is blocked will need to be protected until the numbness is gone and the  Strength has returned. Because you cannot feel it, you will  need to take extra care to avoid injury. Because it may be weak, you may have difficulty moving it or using it. You may not know what position it is in without looking at it while the block is in effect.  3. For blocks in the legs and feet, returning to weight bearing and walking needs to be done carefully. You will need to wait until the numbness is entirely gone and the strength has returned. You should be able to move your leg and foot normally before you try and bear weight or walk. You will need someone to be with you when you first try to ensure you do not fall and possibly risk injury.  4. Bruising and tenderness at the needle site are common side effects and will resolve in a few days.  5. Persistent numbness or new problems with movement should be communicated to the surgeon or the [place] [phone] [place] [phone]).    Hand Center Instructions Hand Surgery  Wound Care: Keep your hand elevated above the level of your heart.  Do not allow it to dangle by your side.  Keep the dressing dry and do not remove it unless your doctor advises you to do so.  He will usually change it at the time of your post-op visit.  Moving your fingers is advised to stimulate circulation but will depend on the site of your surgery.  If you have a splint applied, your doctor will advise you regarding movement.  Activity: Do not drive or operate machinery today.  Rest today and then you may return  to your normal activity and work as indicated by your physician.  Diet:  Drink liquids today or eat a light diet.  You may resume a regular diet tomorrow.    General expectations: Pain for two to three days. Fingers may become slightly swollen.  Call your doctor if any of the following occur: Severe pain not relieved by pain medication. Elevated temperature. Dressing soaked with blood. Inability to move fingers. White or bluish color to fingers. Regional Anesthesia  Blocks  1. Numbness or the inability to move the "blocked" extremity may last from 3-48 hours after placement. The length of time depends on the medication injected and your individual response to the medication. If the numbness is not going away after 48 hours, call your surgeon.  2. The extremity that is blocked will need to be protected until the numbness is gone and the  Strength has returned. Because you cannot feel it, you will need to take extra care to avoid injury. Because it may be weak, you may have difficulty moving it or using it. You may not know what position it is in without looking at it while the block is in effect.  3. For blocks in the legs and feet, returning to weight bearing and walking needs to be done carefully. You will need to wait until the numbness is entirely gone and the strength has returned. You should be able to move your leg and foot normally before you try and bear weight or walk. You will need someone to be with you when you first try to ensure you do not fall and possibly risk injury.  4. Bruising and tenderness at the needle site are common side effects and will resolve in a few days.  5. Persistent numbness or new problems with movement should be communicated to the surgeon or the [place] [phone] [place] [phone]). Post Anesthesia Home Care Instructions  Activity: Get plenty of rest for the remainder of the day. A responsible individual must stay with you for 24 hours following the procedure.  For the next 24 hours, DO NOT: -Drive a car -Paediatric nurse -Drink alcoholic beverages -Take any medication unless instructed by your physician -Make any legal decisions or sign important papers.  Meals: Start with liquid foods such as gelatin or soup. Progress to regular foods as tolerated. Avoid greasy, spicy, heavy foods. If nausea and/or vomiting occur, drink only clear liquids until the nausea and/or vomiting  subsides. Call your physician if vomiting continues.  Special Instructions/Symptoms: Your throat may feel dry or sore from the anesthesia or the breathing tube placed in your throat during surgery. If this causes discomfort, gargle with warm salt water. The discomfort should disappear within 24 hours.  If you had a scopolamine patch placed behind your ear for the management of post- operative nausea and/or vomiting:  1. The medication in the patch is effective for 72 hours, after which it should be removed.  Wrap patch in a tissue and discard in the trash. Wash hands thoroughly with soap and water. 2. You may remove the patch earlier than 72 hours if you experience unpleasant side effects which may include dry mouth, dizziness or visual disturbances. 3. Avoid touching the patch. Wash your hands with soap and water after contact with the patch.

## 2019-09-11 NOTE — Progress Notes (Signed)
Assisted Dr. Germeroth with right, ultrasound guided, axillary block. Side rails up, monitors on throughout procedure. See vital signs in flow sheet. Tolerated Procedure well. 

## 2019-09-12 ENCOUNTER — Encounter: Payer: Self-pay | Admitting: *Deleted

## 2019-09-12 LAB — SURGICAL PATHOLOGY

## 2019-09-17 DIAGNOSIS — S62121D Displaced fracture of lunate [semilunar], right wrist, subsequent encounter for fracture with routine healing: Secondary | ICD-10-CM | POA: Diagnosis not present

## 2019-09-17 DIAGNOSIS — Z4789 Encounter for other orthopedic aftercare: Secondary | ICD-10-CM | POA: Diagnosis not present

## 2019-09-17 DIAGNOSIS — M25531 Pain in right wrist: Secondary | ICD-10-CM | POA: Diagnosis not present

## 2019-09-17 DIAGNOSIS — S62034D Nondisplaced fracture of proximal third of navicular [scaphoid] bone of right wrist, subsequent encounter for fracture with routine healing: Secondary | ICD-10-CM | POA: Diagnosis not present

## 2019-09-17 DIAGNOSIS — M25331 Other instability, right wrist: Secondary | ICD-10-CM | POA: Diagnosis not present

## 2019-09-24 DIAGNOSIS — Z4789 Encounter for other orthopedic aftercare: Secondary | ICD-10-CM | POA: Diagnosis not present

## 2019-09-24 DIAGNOSIS — M25331 Other instability, right wrist: Secondary | ICD-10-CM | POA: Diagnosis not present

## 2019-10-08 DIAGNOSIS — Z4789 Encounter for other orthopedic aftercare: Secondary | ICD-10-CM | POA: Diagnosis not present

## 2019-10-08 DIAGNOSIS — M25331 Other instability, right wrist: Secondary | ICD-10-CM | POA: Diagnosis not present

## 2019-10-10 ENCOUNTER — Other Ambulatory Visit: Payer: Self-pay | Admitting: Family Medicine

## 2019-10-13 ENCOUNTER — Other Ambulatory Visit: Payer: Self-pay | Admitting: Family Medicine

## 2019-10-13 DIAGNOSIS — M25631 Stiffness of right wrist, not elsewhere classified: Secondary | ICD-10-CM | POA: Diagnosis not present

## 2019-10-13 DIAGNOSIS — M25531 Pain in right wrist: Secondary | ICD-10-CM | POA: Diagnosis not present

## 2019-10-13 DIAGNOSIS — M25331 Other instability, right wrist: Secondary | ICD-10-CM | POA: Diagnosis not present

## 2019-10-13 DIAGNOSIS — R911 Solitary pulmonary nodule: Secondary | ICD-10-CM

## 2019-10-13 DIAGNOSIS — M25641 Stiffness of right hand, not elsewhere classified: Secondary | ICD-10-CM | POA: Diagnosis not present

## 2019-10-13 DIAGNOSIS — M25431 Effusion, right wrist: Secondary | ICD-10-CM | POA: Diagnosis not present

## 2019-10-14 ENCOUNTER — Ambulatory Visit
Admission: RE | Admit: 2019-10-14 | Discharge: 2019-10-14 | Disposition: A | Discharge status: Home / Self Care | Payer: PPO | Source: Ambulatory Visit | Attending: Family Medicine | Admitting: Family Medicine

## 2019-10-14 DIAGNOSIS — R918 Other nonspecific abnormal finding of lung field: Secondary | ICD-10-CM | POA: Diagnosis not present

## 2019-10-14 DIAGNOSIS — R911 Solitary pulmonary nodule: Secondary | ICD-10-CM

## 2019-10-14 MED ORDER — IOPAMIDOL (ISOVUE-300) INJECTION 61%
75.0000 mL | Freq: Once | INTRAVENOUS | Status: AC | PRN
  Administered 2019-10-14: 75 mL via INTRAVENOUS

## 2019-10-21 DIAGNOSIS — M25331 Other instability, right wrist: Secondary | ICD-10-CM | POA: Diagnosis not present

## 2019-10-21 DIAGNOSIS — M25641 Stiffness of right hand, not elsewhere classified: Secondary | ICD-10-CM | POA: Diagnosis not present

## 2019-10-21 DIAGNOSIS — M25631 Stiffness of right wrist, not elsewhere classified: Secondary | ICD-10-CM | POA: Diagnosis not present

## 2019-10-21 DIAGNOSIS — M25531 Pain in right wrist: Secondary | ICD-10-CM | POA: Diagnosis not present

## 2019-10-21 DIAGNOSIS — M25431 Effusion, right wrist: Secondary | ICD-10-CM | POA: Diagnosis not present

## 2019-10-31 DIAGNOSIS — M25331 Other instability, right wrist: Secondary | ICD-10-CM | POA: Diagnosis not present

## 2019-10-31 DIAGNOSIS — M25631 Stiffness of right wrist, not elsewhere classified: Secondary | ICD-10-CM | POA: Diagnosis not present

## 2019-10-31 DIAGNOSIS — M25641 Stiffness of right hand, not elsewhere classified: Secondary | ICD-10-CM | POA: Diagnosis not present

## 2019-11-05 DIAGNOSIS — E7800 Pure hypercholesterolemia, unspecified: Secondary | ICD-10-CM | POA: Diagnosis not present

## 2019-11-05 DIAGNOSIS — N401 Enlarged prostate with lower urinary tract symptoms: Secondary | ICD-10-CM | POA: Diagnosis not present

## 2019-11-05 DIAGNOSIS — I10 Essential (primary) hypertension: Secondary | ICD-10-CM | POA: Diagnosis not present

## 2019-11-05 DIAGNOSIS — M8580 Other specified disorders of bone density and structure, unspecified site: Secondary | ICD-10-CM | POA: Diagnosis not present

## 2019-11-05 DIAGNOSIS — E119 Type 2 diabetes mellitus without complications: Secondary | ICD-10-CM | POA: Diagnosis not present

## 2019-11-05 DIAGNOSIS — E1165 Type 2 diabetes mellitus with hyperglycemia: Secondary | ICD-10-CM | POA: Diagnosis not present

## 2019-11-05 DIAGNOSIS — J439 Emphysema, unspecified: Secondary | ICD-10-CM | POA: Diagnosis not present

## 2019-11-28 DIAGNOSIS — Z4789 Encounter for other orthopedic aftercare: Secondary | ICD-10-CM | POA: Diagnosis not present

## 2019-11-28 DIAGNOSIS — M25331 Other instability, right wrist: Secondary | ICD-10-CM | POA: Diagnosis not present

## 2019-12-04 DIAGNOSIS — L820 Inflamed seborrheic keratosis: Secondary | ICD-10-CM | POA: Diagnosis not present

## 2019-12-04 DIAGNOSIS — L578 Other skin changes due to chronic exposure to nonionizing radiation: Secondary | ICD-10-CM | POA: Diagnosis not present

## 2019-12-04 DIAGNOSIS — D225 Melanocytic nevi of trunk: Secondary | ICD-10-CM | POA: Diagnosis not present

## 2019-12-04 DIAGNOSIS — L821 Other seborrheic keratosis: Secondary | ICD-10-CM | POA: Diagnosis not present

## 2019-12-31 DIAGNOSIS — E119 Type 2 diabetes mellitus without complications: Secondary | ICD-10-CM | POA: Diagnosis not present

## 2019-12-31 DIAGNOSIS — J439 Emphysema, unspecified: Secondary | ICD-10-CM | POA: Diagnosis not present

## 2019-12-31 DIAGNOSIS — I10 Essential (primary) hypertension: Secondary | ICD-10-CM | POA: Diagnosis not present

## 2019-12-31 DIAGNOSIS — N401 Enlarged prostate with lower urinary tract symptoms: Secondary | ICD-10-CM | POA: Diagnosis not present

## 2019-12-31 DIAGNOSIS — M8580 Other specified disorders of bone density and structure, unspecified site: Secondary | ICD-10-CM | POA: Diagnosis not present

## 2019-12-31 DIAGNOSIS — E7800 Pure hypercholesterolemia, unspecified: Secondary | ICD-10-CM | POA: Diagnosis not present

## 2019-12-31 DIAGNOSIS — E1165 Type 2 diabetes mellitus with hyperglycemia: Secondary | ICD-10-CM | POA: Diagnosis not present

## 2020-02-23 DIAGNOSIS — E119 Type 2 diabetes mellitus without complications: Secondary | ICD-10-CM | POA: Diagnosis not present

## 2020-02-23 DIAGNOSIS — H524 Presbyopia: Secondary | ICD-10-CM | POA: Diagnosis not present

## 2020-02-23 DIAGNOSIS — H52202 Unspecified astigmatism, left eye: Secondary | ICD-10-CM | POA: Diagnosis not present

## 2020-02-23 DIAGNOSIS — H2513 Age-related nuclear cataract, bilateral: Secondary | ICD-10-CM | POA: Diagnosis not present

## 2020-03-01 DIAGNOSIS — E1165 Type 2 diabetes mellitus with hyperglycemia: Secondary | ICD-10-CM | POA: Diagnosis not present

## 2020-03-01 DIAGNOSIS — J439 Emphysema, unspecified: Secondary | ICD-10-CM | POA: Diagnosis not present

## 2020-03-01 DIAGNOSIS — E119 Type 2 diabetes mellitus without complications: Secondary | ICD-10-CM | POA: Diagnosis not present

## 2020-03-01 DIAGNOSIS — E7800 Pure hypercholesterolemia, unspecified: Secondary | ICD-10-CM | POA: Diagnosis not present

## 2020-03-01 DIAGNOSIS — M8580 Other specified disorders of bone density and structure, unspecified site: Secondary | ICD-10-CM | POA: Diagnosis not present

## 2020-03-01 DIAGNOSIS — N401 Enlarged prostate with lower urinary tract symptoms: Secondary | ICD-10-CM | POA: Diagnosis not present

## 2020-03-01 DIAGNOSIS — I10 Essential (primary) hypertension: Secondary | ICD-10-CM | POA: Diagnosis not present

## 2020-04-26 DIAGNOSIS — E1165 Type 2 diabetes mellitus with hyperglycemia: Secondary | ICD-10-CM | POA: Diagnosis not present

## 2020-04-26 DIAGNOSIS — I10 Essential (primary) hypertension: Secondary | ICD-10-CM | POA: Diagnosis not present

## 2020-04-26 DIAGNOSIS — Z7984 Long term (current) use of oral hypoglycemic drugs: Secondary | ICD-10-CM | POA: Diagnosis not present

## 2020-04-26 DIAGNOSIS — Z125 Encounter for screening for malignant neoplasm of prostate: Secondary | ICD-10-CM | POA: Diagnosis not present

## 2020-04-26 DIAGNOSIS — E7800 Pure hypercholesterolemia, unspecified: Secondary | ICD-10-CM | POA: Diagnosis not present

## 2020-05-03 DIAGNOSIS — Z136 Encounter for screening for cardiovascular disorders: Secondary | ICD-10-CM | POA: Diagnosis not present

## 2020-05-03 DIAGNOSIS — I10 Essential (primary) hypertension: Secondary | ICD-10-CM | POA: Diagnosis not present

## 2020-05-03 DIAGNOSIS — Z0001 Encounter for general adult medical examination with abnormal findings: Secondary | ICD-10-CM | POA: Diagnosis not present

## 2020-05-03 DIAGNOSIS — E7800 Pure hypercholesterolemia, unspecified: Secondary | ICD-10-CM | POA: Diagnosis not present

## 2020-05-03 DIAGNOSIS — F419 Anxiety disorder, unspecified: Secondary | ICD-10-CM | POA: Diagnosis not present

## 2020-05-03 DIAGNOSIS — E1169 Type 2 diabetes mellitus with other specified complication: Secondary | ICD-10-CM | POA: Diagnosis not present

## 2020-05-03 DIAGNOSIS — N529 Male erectile dysfunction, unspecified: Secondary | ICD-10-CM | POA: Diagnosis not present

## 2020-05-03 DIAGNOSIS — Z125 Encounter for screening for malignant neoplasm of prostate: Secondary | ICD-10-CM | POA: Diagnosis not present

## 2020-05-03 DIAGNOSIS — R351 Nocturia: Secondary | ICD-10-CM | POA: Diagnosis not present

## 2020-05-03 DIAGNOSIS — M545 Low back pain, unspecified: Secondary | ICD-10-CM | POA: Diagnosis not present

## 2020-05-03 DIAGNOSIS — F17201 Nicotine dependence, unspecified, in remission: Secondary | ICD-10-CM | POA: Diagnosis not present

## 2020-05-19 DIAGNOSIS — Z23 Encounter for immunization: Secondary | ICD-10-CM | POA: Diagnosis not present

## 2020-05-21 DIAGNOSIS — Z136 Encounter for screening for cardiovascular disorders: Secondary | ICD-10-CM | POA: Diagnosis not present

## 2020-05-21 DIAGNOSIS — Z87891 Personal history of nicotine dependence: Secondary | ICD-10-CM | POA: Diagnosis not present

## 2020-05-31 ENCOUNTER — Other Ambulatory Visit: Payer: Self-pay | Admitting: Family Medicine

## 2020-05-31 DIAGNOSIS — R3915 Urgency of urination: Secondary | ICD-10-CM | POA: Diagnosis not present

## 2020-05-31 DIAGNOSIS — I719 Aortic aneurysm of unspecified site, without rupture: Secondary | ICD-10-CM

## 2020-06-14 ENCOUNTER — Other Ambulatory Visit: Payer: Self-pay

## 2020-06-14 ENCOUNTER — Ambulatory Visit
Admission: RE | Admit: 2020-06-14 | Discharge: 2020-06-14 | Disposition: A | Discharge status: Home / Self Care | Payer: Medicare Other | Source: Ambulatory Visit | Attending: Family Medicine | Admitting: Family Medicine

## 2020-06-14 DIAGNOSIS — I513 Intracardiac thrombosis, not elsewhere classified: Secondary | ICD-10-CM | POA: Diagnosis not present

## 2020-06-14 DIAGNOSIS — I719 Aortic aneurysm of unspecified site, without rupture: Secondary | ICD-10-CM

## 2020-06-14 DIAGNOSIS — I714 Abdominal aortic aneurysm, without rupture: Secondary | ICD-10-CM | POA: Diagnosis not present

## 2020-06-14 DIAGNOSIS — I712 Thoracic aortic aneurysm, without rupture: Secondary | ICD-10-CM | POA: Diagnosis not present

## 2020-06-14 DIAGNOSIS — Z136 Encounter for screening for cardiovascular disorders: Secondary | ICD-10-CM | POA: Diagnosis not present

## 2020-06-14 MED ORDER — IOPAMIDOL (ISOVUE-370) INJECTION 76%
75.0000 mL | Freq: Once | INTRAVENOUS | Status: AC | PRN
  Administered 2020-06-14: 75 mL via INTRAVENOUS

## 2020-06-15 ENCOUNTER — Other Ambulatory Visit: Payer: Self-pay

## 2020-06-15 ENCOUNTER — Encounter: Payer: Self-pay | Admitting: Vascular Surgery

## 2020-06-15 ENCOUNTER — Ambulatory Visit: Payer: Medicare Other | Admitting: Vascular Surgery

## 2020-06-15 DIAGNOSIS — I714 Abdominal aortic aneurysm, without rupture, unspecified: Secondary | ICD-10-CM | POA: Insufficient documentation

## 2020-06-15 NOTE — Progress Notes (Signed)
Patient name: Aaron Weaver. MRN: [id number] DOB: [birth year] Sex: male  REASON FOR CONSULT: Evaluate abdominal aortic aneurysm  HPI: Aaron Weaver. is a 65 y.o. male, with history of diabetes, hypertension, remote tobacco abuse that presents for evaluation of abdominal aortic aneurysm.  States he had a screening ultrasound through his PCP Dr. Alyson Ingles given his tobacco abuse.  Aortic duplex on 03/21/2020 showed a 4.7 cm distal abdominal aortic aneurysm.  He denies any previous knowledge of this aneurysm.  States his dad did have an aneurysm and he did not have it repaired.  He smoked until about 2 years ago and then quit.  No significant abdominal pain at this time.  Does have history of chronic back pain that is at baseline.  Patient is retired and does live at home with his wife.  He had a CT scan yesterday ordered by his PCP that showed a focal anterior projecting abdominal aortic aneurysm measuring 4.1 cm consistent with ulcerative etiology.  Past Medical History:  Diagnosis Date  . Acute sinusitis, unspecified   . Allergic rhinitis   . Allergy   . Anxiety state, unspecified   . Corns and callosities    toe  . Cough   . Cramp of limb    legs  . Depression   . Impacted cerumen   . Impotence of organic origin   . Lumbago   . Other and unspecified hyperlipidemia   . Routine general medical examination at a health care facility   . Tobacco use disorder   . Type II or unspecified type diabetes mellitus without mention of complication, not stated as uncontrolled    type II  . Unspecified essential hypertension     Past Surgical History:  Procedure Laterality Date  . CARPECTOMY Right 09/11/2019   Procedure: PROXIMAL ROW CARPECTOMY; RADIAL STYLOIDECTOMY; POSTERIOR INTEROSSIUS NERVE RESECTION;  Surgeon: Daryll Brod, MD;  Location: [facility];  Service: Orthopedics;  Laterality: Right;  AXILLARY BLOCK  . COLONOSCOPY    . INGUINAL HERNIA REPAIR     right  .  POLYPECTOMY      Family History  Problem Relation Age of Onset  . Cancer Mother        colon  . Colon cancer Mother 53  . Hypertension Father   . Coronary artery disease Other        male<60 and male<50  1st degree relative  . Colon polyps Brother   . Esophageal cancer Neg Hx   . Rectal cancer Neg Hx   . Stomach cancer Neg Hx     SOCIAL HISTORY: Social History   Socioeconomic History  . Marital status: Married    Spouse name: Aaron Weaver  . Number of children: Not on file  . Years of education: Not on file  . Highest education level: Not on file  Occupational History  . 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 Smoking status: Former Smoker    Packs/day: 0.50    Years: 0.00    Pack years: 0.00    Types: Cigarettes  . Smokeless tobacco: Never Used  . Tobacco comment: started at age 60.  Vaping Use  . Vaping Use: Never used  Substance and Sexual Activity  . Alcohol use: No  . Drug use: No  . Sexual activity: Yes  Other Topics Concern  . Not on file  Social History Narrative  . Not on file   Social Determinants of Health   Financial Resource  Strain: Not on file  Food Insecurity: Not on file  Transportation Needs: Not on file  Physical Activity: Not on file  Stress: Not on file  Social Connections: Not on file  Intimate Partner Violence: Not on file    Allergies  Allergen Reactions  . Fexofenadine Other (See Comments)    "dries me out too much"  . Hydrochlorothiazide Other (See Comments)    REACTION: cramps  . Loratadine Other (See Comments)    sleepy  . Penicillin V Other (See Comments)  . Penicillins Hives  . Pravastatin Sodium Other (See Comments)    REACTION: aches  . Pravastatin Sodium Other (See Comments)    REACTION: aches  . Requip [Ropinirole Hcl] Other (See Comments)    Bad dreams  . Sudafed [Pseudoephedrine] Other (See Comments)    dry    Current Outpatient Medications  Medication Sig Dispense Refill  . atenolol-chlorthalidone  (TENORETIC) 100-25 MG per tablet TAKE 1 TABLET BY MOUTH DAILY. (Patient taking differently: Take 1 tablet by mouth daily.) 30 tablet 0  . Cholecalciferol (VITAMIN D3) 1000 UNITS tablet Take 1,000 Units by mouth daily.    . DULoxetine (CYMBALTA) 60 MG capsule Take 60 mg by mouth daily.    . Fish Oil OIL Take 1 tablet by mouth 2 (two) times daily.    Marland Kitchen glimepiride (AMARYL) 2 MG tablet Take 1 tablet (2 mg total) by mouth daily before breakfast. 90 tablet 3  . JANUMET XR 50-1000 MG TB24 Take 1 tablet by mouth at bedtime.     . potassium chloride SA (KLOR-CON M20) 20 MEQ tablet TAKE 1 TABLET (20 MEQ TOTAL) BY MOUTH DAILY.  (Patient taking differently: Take 20 mEq by mouth daily.) 30 tablet 5  . quinapril (ACCUPRIL) 20 MG tablet TAKE 1 TABLET BY MOUTH EVERY DAY (Patient taking differently: Take 20 mg by mouth daily.) 90 tablet 0  . aspirin 81 MG EC tablet Take 1 tablet (81 mg total) by mouth daily. 30 tablet 0  . oxyCODONE-acetaminophen (PERCOCET) 7.5-325 MG tablet Take 1 tablet by mouth every 4 (four) hours as needed for severe pain. 30 tablet 0   Current Facility-Administered Medications  Medication Dose Route Frequency Provider Last Rate Last Admin  . 0.9 %  sodium chloride infusion  500 mL Intravenous Once Ladene Artist, MD        REVIEW OF SYSTEMS:  [X]  denotes positive finding, [ ]  denotes negative finding Cardiac  Comments:  Chest pain or chest pressure:    Shortness of breath upon exertion:    Short of breath when lying flat:    Irregular heart rhythm:        Vascular    Pain in calf, thigh, or hip brought on by ambulation:    Pain in feet at night that wakes you up from your sleep:     Blood clot in your veins:    Leg swelling:         Pulmonary    Oxygen at home:    Productive cough:     Wheezing:         Neurologic    Sudden weakness in arms or legs:     Sudden numbness in arms or legs:     Sudden onset of difficulty speaking or slurred speech:    Temporary loss of  vision in one eye:     Problems with dizziness:         Gastrointestinal    Blood in stool:  Vomited blood:         Genitourinary    Burning when urinating:     Blood in urine:        Psychiatric    Major depression:         Hematologic    Bleeding problems:    Problems with blood clotting too easily:        Skin    Rashes or ulcers:        Constitutional    Fever or chills:      PHYSICAL EXAM: Vitals:   06/15/20 1213  Resp: 18  Weight: 187 lb (84.8 kg)  Height: 6' (1.829 m)    GENERAL: The patient is a well-nourished male, in no acute distress. The vital signs are documented above. CARDIAC: There is a regular rate and rhythm.  VASCULAR:  Palpable femoral pulses bilaterally Palpable PT pulses bilaterally PULMONARY: There is good air exchange bilaterally without wheezing or rales. ABDOMEN: Soft and non-tender.  No pain with deep palpation. MUSCULOSKELETAL: There are no major deformities or cyanosis. NEUROLOGIC: No focal weakness or paresthesias are detected. SKIN: There are no ulcers or rashes noted. PSYCHIATRIC: The patient has a normal affect.  DATA:   CTA on my review shows a saccular aneurysm projecting from the anterior wall of the infrarenal abdominal aorta consistent with a previous penetrating aortic ulcer and maximal diameter is 4.1 cm  Assessment/Plan:  65 year old male presents for evaluation of abdominal aortic aneurysm noted on screening ultrasound ordered by his PCP Dr. Alyson Ingles.  Screening US showed a distal 4.7 cm AAA.  I reviewed his CTA from yesterday ordered by Dr. 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  I discussed with him and his wife in detail that generally this morphology is considered higher risk and likely should entertain stent graft repair.  He has good access and I think would be a good candidate for stent graft  repair.  I discussed the steps of the operation with him and his wife.  Risk and benefits were discussed in detail.  She wants to wait until after March 1 given her mom has had some health issues which is understandable.  Will likely get him scheduled for 07/12/2020.   [name], MD [facility] Office: [phone]

## 2020-06-30 DIAGNOSIS — I10 Essential (primary) hypertension: Secondary | ICD-10-CM | POA: Diagnosis not present

## 2020-06-30 DIAGNOSIS — E119 Type 2 diabetes mellitus without complications: Secondary | ICD-10-CM | POA: Diagnosis not present

## 2020-06-30 DIAGNOSIS — E7800 Pure hypercholesterolemia, unspecified: Secondary | ICD-10-CM | POA: Diagnosis not present

## 2020-06-30 DIAGNOSIS — E1165 Type 2 diabetes mellitus with hyperglycemia: Secondary | ICD-10-CM | POA: Diagnosis not present

## 2020-06-30 DIAGNOSIS — M8580 Other specified disorders of bone density and structure, unspecified site: Secondary | ICD-10-CM | POA: Diagnosis not present

## 2020-06-30 DIAGNOSIS — J439 Emphysema, unspecified: Secondary | ICD-10-CM | POA: Diagnosis not present

## 2020-06-30 DIAGNOSIS — E1169 Type 2 diabetes mellitus with other specified complication: Secondary | ICD-10-CM | POA: Diagnosis not present

## 2020-07-07 ENCOUNTER — Encounter (HOSPITAL_COMMUNITY): Payer: Self-pay

## 2020-07-07 ENCOUNTER — Other Ambulatory Visit: Payer: Self-pay

## 2020-07-07 ENCOUNTER — Encounter (HOSPITAL_COMMUNITY)
Admission: RE | Admit: 2020-07-07 | Discharge: 2020-07-07 | Disposition: A | Discharge status: Home / Self Care | Payer: Medicare Other | Source: Ambulatory Visit | Attending: Vascular Surgery | Admitting: Vascular Surgery

## 2020-07-07 DIAGNOSIS — E860 Dehydration: Secondary | ICD-10-CM | POA: Diagnosis not present

## 2020-07-07 DIAGNOSIS — Z01812 Encounter for preprocedural laboratory examination: Secondary | ICD-10-CM | POA: Insufficient documentation

## 2020-07-07 HISTORY — DX: Chronic obstructive pulmonary disease, unspecified: J44.9

## 2020-07-07 HISTORY — DX: Cardiac arrhythmia, unspecified: I49.9

## 2020-07-07 LAB — URINALYSIS, ROUTINE W REFLEX MICROSCOPIC
Bilirubin Urine: NEGATIVE
Glucose, UA: 50 mg/dL — AB
Hgb urine dipstick: NEGATIVE
Ketones, ur: NEGATIVE mg/dL
Leukocytes,Ua: NEGATIVE
Nitrite: NEGATIVE
Protein, ur: NEGATIVE mg/dL
Specific Gravity, Urine: 1.017 (ref 1.005–1.030)
pH: 5 (ref 5.0–8.0)

## 2020-07-07 LAB — COMPREHENSIVE METABOLIC PANEL
ALT: 31 U/L (ref 0–44)
AST: 30 U/L (ref 15–41)
Albumin: 3.7 g/dL (ref 3.5–5.0)
Alkaline Phosphatase: 48 U/L (ref 38–126)
Anion gap: 11 (ref 5–15)
BUN: 29 mg/dL — ABNORMAL HIGH (ref 8–23)
CO2: 20 mmol/L — ABNORMAL LOW (ref 22–32)
Calcium: 9.1 mg/dL (ref 8.9–10.3)
Chloride: 105 mmol/L (ref 98–111)
Creatinine, Ser: 1.17 mg/dL (ref 0.61–1.24)
GFR, Estimated: >60 mL/min (ref >60)
Glucose, Bld: 232 mg/dL — ABNORMAL HIGH (ref 70–99)
Potassium: 3.9 mmol/L (ref 3.5–5.1)
Sodium: 136 mmol/L (ref 135–145)
Total Bilirubin: 0.8 mg/dL (ref 0.3–1.2)
Total Protein: 6.8 g/dL (ref 6.5–8.1)

## 2020-07-07 LAB — BLOOD GAS, ARTERIAL
Acid-base deficit: 1.2 mmol/L (ref 0.0–2.0)
Bicarbonate: 22.7 mmol/L (ref 20.0–28.0)
Drawn by: 58793
FIO2: 21
O2 Saturation: 97.3 %
Patient temperature: 37
pCO2 arterial: 35.9 mmHg (ref 32.0–48.0)
pH, Arterial: 7.416 (ref 7.350–7.450)
pO2, Arterial: 93 mmHg (ref 83.0–108.0)

## 2020-07-07 LAB — TYPE AND SCREEN
ABO/RH(D): B POS
Antibody Screen: NEGATIVE

## 2020-07-07 LAB — APTT: aPTT: 34 seconds (ref 24–36)

## 2020-07-07 LAB — CBC
HCT: 38.9 % — ABNORMAL LOW (ref 39.0–52.0)
Hemoglobin: 13.1 g/dL (ref 13.0–17.0)
MCH: 28.5 pg (ref 26.0–34.0)
MCHC: 33.7 g/dL (ref 30.0–36.0)
MCV: 84.7 fL (ref 80.0–100.0)
Platelets: 160 10*3/uL (ref 150–400)
RBC: 4.59 MIL/uL (ref 4.22–5.81)
RDW: 12.9 % (ref 11.5–15.5)
WBC: 4.8 10*3/uL (ref 4.0–10.5)
nRBC: 0 % (ref 0.0–0.2)

## 2020-07-07 LAB — HEMOGLOBIN A1C
Hgb A1c MFr Bld: 8.4 % — ABNORMAL HIGH (ref 4.8–5.6)
Mean Plasma Glucose: 194.38 mg/dL

## 2020-07-07 LAB — GLUCOSE, CAPILLARY: Glucose-Capillary: 229 mg/dL — ABNORMAL HIGH (ref 70–99)

## 2020-07-07 LAB — SURGICAL PCR SCREEN
MRSA, PCR: NEGATIVE
Staphylococcus aureus: POSITIVE — AB

## 2020-07-07 LAB — PROTIME-INR
INR: 1 (ref 0.8–1.2)
Prothrombin Time: 13.1 seconds (ref 11.4–15.2)

## 2020-07-07 NOTE — Progress Notes (Signed)
Surgical Instructions    Your procedure is scheduled on 07/12/20.  Report to [facility] Main Entrance "A" at 05:30 A.M., then check in with the Admitting office.  Call this number if you have problems the morning of surgery:  [phone]   If you have any questions prior to your surgery date call [phone]: Open Monday-Friday 8am-4pm    Remember:  Do not eat or drink after midnight the night before your surgery    Take these medicines the morning of surgery with A SIP OF WATER  atenolol-chlorthalidone (TENORETIC)  DULoxetine (CYMBALTA) omeprazole (PRILOSEC) rosuvastatin (CRESTOR)   WHAT DO I DO ABOUT MY DIABETES MEDICATION?   Marland Kitchen Do not take oral diabetes medicines (pills) the morning of surgery.  . THE DAY BEFORE SURGERY, take glimepiride (AMARYL) in the morning. Do not take the evening dose of glimepiride (AMARYL). Take your usual dose of JANUMET XR.   . THE MORNING OF SURGERY, do not take glimepiride (AMARYL) and JANUMET XR.   As of today, STOP taking any Aspirin (unless otherwise instructed by your surgeon) Aleve, Naproxen, Ibuprofen, Motrin, Advil, Goody's, BC's, all herbal medications, fish oil, and all vitamins.      HOW TO MANAGE YOUR DIABETES BEFORE AND AFTER SURGERY  Why is it important to control my blood sugar before and after surgery? . Improving blood sugar levels before and after surgery helps healing and can limit problems. . A way of improving blood sugar control is eating a healthy diet by: o  Eating less sugar and carbohydrates o  Increasing activity/exercise o  Talking with your doctor about reaching your blood sugar goals . High blood sugars (greater than 180 mg/dL) can raise your risk of infections and slow your recovery, so you will need to focus on controlling your diabetes during the weeks before surgery. . Make sure that the doctor who takes care of your diabetes knows about your planned surgery including the date and location.  How do I  manage my blood sugar before surgery? . Check your blood sugar at least 4 times a day, starting 2 days before surgery, to make sure that the level is not too high or low. . Check your blood sugar the morning of your surgery when you wake up and every 2 hours until you get to the Short Stay unit. o If your blood sugar is less than 70 mg/dL, you will need to treat for low blood sugar: - Do not take insulin. - Treat a low blood sugar (less than 70 mg/dL) with  cup of clear juice (cranberry or apple), 4 glucose tablets, OR glucose gel. - Recheck blood sugar in 15 minutes after treatment (to make sure it is greater than 70 mg/dL). If your blood sugar is not greater than 70 mg/dL on recheck, call [phone] for further instructions. . Report your blood sugar to the short stay nurse when you get to Short Stay.  . If you are admitted to the hospital after surgery: o Your blood sugar will be checked by the staff and you will probably be given insulin after surgery (instead of oral diabetes medicines) to make sure you have good blood sugar levels. o The goal for blood sugar control after surgery is 80-180 mg/dL.                      Do not wear jewelry, make up, or nail polish            Do not wear lotions,  powders, perfumes/colognes, or deodorant.            Men may shave face and neck.            Do not bring valuables to the hospital.            [facility] is not responsible for any belongings or valuables.  Do NOT Smoke (Tobacco/Vaping) or drink Alcohol 24 hours prior to your procedure If you use a CPAP at night, you may bring all equipment for your overnight stay.   Contacts, glasses, dentures or bridgework may not be worn into surgery, please bring cases for these belongings   For patients admitted to the hospital, discharge time will be determined by your treatment team.   Patients discharged the day of surgery will not be allowed to drive home, and someone needs to stay with them for 24  hours.    Special instructions:   [place]- Preparing For Surgery  Before surgery, you can play an important role. Because skin is not sterile, your skin needs to be as free of germs as possible. You can reduce the number of germs on your skin by washing with CHG (chlorahexidine gluconate) Soap before surgery.  CHG is an antiseptic cleaner which kills germs and bonds with the skin to continue killing germs even after washing.    Oral Hygiene is also important to reduce your risk of infection.  Remember - BRUSH YOUR TEETH THE MORNING OF SURGERY WITH YOUR REGULAR TOOTHPASTE  Please do not use if you have an allergy to CHG or antibacterial soaps. If your skin becomes reddened/irritated stop using the CHG.  Do not shave (including legs and underarms) for at least 48 hours prior to first CHG shower. It is OK to shave your face.  Please follow these instructions carefully.   1. Shower the NIGHT BEFORE SURGERY and the MORNING OF SURGERY  2. If you chose to wash your hair, wash your hair first as usual with your normal shampoo.  3. After you shampoo, rinse your hair and body thoroughly to remove the shampoo.  4. Wash Face and genitals (private parts) with your normal soap.   5.  Shower the NIGHT BEFORE SURGERY and the MORNING OF SURGERY with CHG Soap.   6. Use CHG Soap as you would any other liquid soap. You can apply CHG directly to the skin and wash gently with a scrungie or a clean washcloth.   7. Apply the CHG Soap to your body ONLY FROM THE NECK DOWN.  Do not use on open wounds or open sores. Avoid contact with your eyes, ears, mouth and genitals (private parts). Wash Face and genitals (private parts)  with your normal soap.   8. Wash thoroughly, paying special attention to the area where your surgery will be performed.  9. Thoroughly rinse your body with warm water from the neck down.  10. DO NOT shower/wash with your normal soap after using and rinsing off the CHG Soap.  11. Pat  yourself dry with a CLEAN TOWEL.  12. Wear CLEAN PAJAMAS to bed the night before surgery  13. Place CLEAN SHEETS on your bed the night before your surgery  14. DO NOT SLEEP WITH PETS.   Day of Surgery: Take a shower.  Wear Clean/Comfortable clothing the morning of surgery Do not apply any deodorants/lotions.   Remember to brush your teeth WITH YOUR REGULAR TOOTHPASTE.   Please read over the following fact sheets that you were given.

## 2020-07-07 NOTE — Progress Notes (Signed)
PCP - [name] Cardiologist - denies Urologist: Dr. Alexis Frock  PPM/ICD - denies   Chest x-ray - n/a EKG - 09/08/19 Stress Test - denies ECHO - 06/22/18 Cardiac Cath - denies  Sleep Study - denies   Fasting Blood Sugar - 130 Checks Blood Sugar once a week  Patient instructed to hold all Aspirin, NSAID's, herbal medications, fish oil and vitamins 7 days prior to surgery.   ERAS Protcol -no   COVID TEST- 3/5   Anesthesia review: no  Patient denies shortness of breath, fever, cough and chest pain at PAT appointment   All instructions explained to the patient, with a verbal understanding of the material. Patient agrees to go over the instructions while at home for a better understanding. Patient also instructed to self quarantine after being tested for COVID-19. The opportunity to ask questions was provided.

## 2020-07-10 ENCOUNTER — Other Ambulatory Visit (HOSPITAL_COMMUNITY)
Admission: RE | Admit: 2020-07-10 | Discharge: 2020-07-10 | Disposition: A | Discharge status: Home / Self Care | Payer: Medicare Other | Source: Ambulatory Visit | Attending: Vascular Surgery | Admitting: Vascular Surgery

## 2020-07-10 DIAGNOSIS — E1151 Type 2 diabetes mellitus with diabetic peripheral angiopathy without gangrene: Secondary | ICD-10-CM | POA: Diagnosis not present

## 2020-07-10 DIAGNOSIS — E876 Hypokalemia: Secondary | ICD-10-CM | POA: Diagnosis not present

## 2020-07-10 DIAGNOSIS — Z8371 Family history of colonic polyps: Secondary | ICD-10-CM | POA: Diagnosis not present

## 2020-07-10 DIAGNOSIS — Z7982 Long term (current) use of aspirin: Secondary | ICD-10-CM | POA: Diagnosis not present

## 2020-07-10 DIAGNOSIS — Z7984 Long term (current) use of oral hypoglycemic drugs: Secondary | ICD-10-CM | POA: Diagnosis not present

## 2020-07-10 DIAGNOSIS — J449 Chronic obstructive pulmonary disease, unspecified: Secondary | ICD-10-CM | POA: Diagnosis not present

## 2020-07-10 DIAGNOSIS — Z01812 Encounter for preprocedural laboratory examination: Secondary | ICD-10-CM | POA: Insufficient documentation

## 2020-07-10 DIAGNOSIS — Z20822 Contact with and (suspected) exposure to covid-19: Secondary | ICD-10-CM | POA: Insufficient documentation

## 2020-07-10 DIAGNOSIS — Z800 Family history of malignant neoplasm of digestive organs: Secondary | ICD-10-CM | POA: Diagnosis not present

## 2020-07-10 DIAGNOSIS — E7849 Other hyperlipidemia: Secondary | ICD-10-CM | POA: Diagnosis not present

## 2020-07-10 DIAGNOSIS — Z888 Allergy status to other drugs, medicaments and biological substances status: Secondary | ICD-10-CM | POA: Diagnosis not present

## 2020-07-10 DIAGNOSIS — M549 Dorsalgia, unspecified: Secondary | ICD-10-CM | POA: Diagnosis not present

## 2020-07-10 DIAGNOSIS — Z79899 Other long term (current) drug therapy: Secondary | ICD-10-CM | POA: Diagnosis not present

## 2020-07-10 DIAGNOSIS — I10 Essential (primary) hypertension: Secondary | ICD-10-CM | POA: Diagnosis not present

## 2020-07-10 DIAGNOSIS — Z8249 Family history of ischemic heart disease and other diseases of the circulatory system: Secondary | ICD-10-CM | POA: Diagnosis not present

## 2020-07-10 DIAGNOSIS — I70201 Unspecified atherosclerosis of native arteries of extremities, right leg: Secondary | ICD-10-CM | POA: Diagnosis not present

## 2020-07-10 DIAGNOSIS — Z880 Allergy status to penicillin: Secondary | ICD-10-CM | POA: Diagnosis not present

## 2020-07-10 DIAGNOSIS — I714 Abdominal aortic aneurysm, without rupture: Secondary | ICD-10-CM | POA: Diagnosis not present

## 2020-07-10 DIAGNOSIS — G8929 Other chronic pain: Secondary | ICD-10-CM | POA: Diagnosis not present

## 2020-07-10 DIAGNOSIS — E785 Hyperlipidemia, unspecified: Secondary | ICD-10-CM | POA: Diagnosis not present

## 2020-07-10 DIAGNOSIS — Z87891 Personal history of nicotine dependence: Secondary | ICD-10-CM | POA: Diagnosis not present

## 2020-07-10 LAB — SARS CORONAVIRUS 2 (TAT 6-24 HRS): SARS Coronavirus 2: NEGATIVE

## 2020-07-11 NOTE — Anesthesia Preprocedure Evaluation (Addendum)
Anesthesia Evaluation  Patient identified by MRN, date of birth, ID band Patient awake    Reviewed: Allergy & Precautions, H&P , NPO status , Patient's Chart, lab work & pertinent test results  Airway Mallampati: II  TM Distance: >3 FB Neck ROM: Full    Dental no notable dental hx. (+) Edentulous Upper, Edentulous Lower, Dental Advisory Given   Pulmonary COPD, former smoker,    Pulmonary exam normal breath sounds clear to auscultation       Cardiovascular Exercise Tolerance: Good hypertension, Pt. on medications and Pt. on home beta blockers + Peripheral Vascular Disease  + dysrhythmias  Rhythm:Regular Rate:Normal     Neuro/Psych Anxiety Depression negative neurological ROS     GI/Hepatic negative GI ROS, Neg liver ROS,   Endo/Other  diabetes, Type 2, Oral Hypoglycemic Agents  Renal/GU negative Renal ROS  negative genitourinary   Musculoskeletal   Abdominal   Peds  Hematology negative hematology ROS (+)   Anesthesia Other Findings   Reproductive/Obstetrics negative OB ROS                            Anesthesia Physical Anesthesia Plan  ASA: III  Anesthesia Plan: General   Post-op Pain Management:    Induction: Intravenous  PONV Risk Score and Plan: 3 and Ondansetron, Dexamethasone and Midazolam  Airway Management Planned: Oral ETT  Additional Equipment: Arterial line  Intra-op Plan:   Post-operative Plan: Extubation in OR  Informed Consent: I have reviewed the patients History and Physical, chart, labs and discussed the procedure including the risks, benefits and alternatives for the proposed anesthesia with the patient or authorized representative who has indicated his/her understanding and acceptance.     Dental advisory given  Plan Discussed with: CRNA  Anesthesia Plan Comments:        Anesthesia Quick Evaluation

## 2020-07-12 ENCOUNTER — Encounter (HOSPITAL_COMMUNITY): Payer: Self-pay | Admitting: Vascular Surgery

## 2020-07-12 ENCOUNTER — Encounter (HOSPITAL_COMMUNITY)
Admission: RE | Disposition: A | Discharge status: Home / Self Care | Payer: Self-pay | Source: Home / Self Care | Attending: Vascular Surgery

## 2020-07-12 ENCOUNTER — Encounter: Payer: Self-pay | Admitting: Vascular Surgery

## 2020-07-12 ENCOUNTER — Other Ambulatory Visit: Payer: Self-pay

## 2020-07-12 ENCOUNTER — Inpatient Hospital Stay (HOSPITAL_COMMUNITY)
Admission: RE | Admit: 2020-07-12 | Discharge: 2020-07-13 | DRG: 269 | Disposition: A | Discharge status: Home / Self Care | Payer: Medicare Other | Attending: Vascular Surgery | Admitting: Vascular Surgery

## 2020-07-12 ENCOUNTER — Inpatient Hospital Stay (HOSPITAL_COMMUNITY): Payer: Medicare Other | Admitting: Anesthesiology

## 2020-07-12 ENCOUNTER — Inpatient Hospital Stay (HOSPITAL_COMMUNITY): Payer: Medicare Other

## 2020-07-12 DIAGNOSIS — I714 Abdominal aortic aneurysm, without rupture, unspecified: Secondary | ICD-10-CM | POA: Diagnosis present

## 2020-07-12 DIAGNOSIS — Z87891 Personal history of nicotine dependence: Secondary | ICD-10-CM

## 2020-07-12 DIAGNOSIS — Z7984 Long term (current) use of oral hypoglycemic drugs: Secondary | ICD-10-CM

## 2020-07-12 DIAGNOSIS — Z800 Family history of malignant neoplasm of digestive organs: Secondary | ICD-10-CM | POA: Diagnosis not present

## 2020-07-12 DIAGNOSIS — Z79899 Other long term (current) drug therapy: Secondary | ICD-10-CM

## 2020-07-12 DIAGNOSIS — Z888 Allergy status to other drugs, medicaments and biological substances status: Secondary | ICD-10-CM

## 2020-07-12 DIAGNOSIS — Z8371 Family history of colonic polyps: Secondary | ICD-10-CM | POA: Diagnosis not present

## 2020-07-12 DIAGNOSIS — Z9889 Other specified postprocedural states: Secondary | ICD-10-CM

## 2020-07-12 DIAGNOSIS — E785 Hyperlipidemia, unspecified: Secondary | ICD-10-CM | POA: Diagnosis present

## 2020-07-12 DIAGNOSIS — J449 Chronic obstructive pulmonary disease, unspecified: Secondary | ICD-10-CM | POA: Diagnosis present

## 2020-07-12 DIAGNOSIS — Z880 Allergy status to penicillin: Secondary | ICD-10-CM

## 2020-07-12 DIAGNOSIS — Z20822 Contact with and (suspected) exposure to covid-19: Secondary | ICD-10-CM | POA: Diagnosis present

## 2020-07-12 DIAGNOSIS — E1151 Type 2 diabetes mellitus with diabetic peripheral angiopathy without gangrene: Secondary | ICD-10-CM | POA: Diagnosis present

## 2020-07-12 DIAGNOSIS — G8929 Other chronic pain: Secondary | ICD-10-CM | POA: Diagnosis present

## 2020-07-12 DIAGNOSIS — Z8249 Family history of ischemic heart disease and other diseases of the circulatory system: Secondary | ICD-10-CM | POA: Diagnosis not present

## 2020-07-12 DIAGNOSIS — Z7982 Long term (current) use of aspirin: Secondary | ICD-10-CM

## 2020-07-12 DIAGNOSIS — M549 Dorsalgia, unspecified: Secondary | ICD-10-CM | POA: Diagnosis present

## 2020-07-12 DIAGNOSIS — I70201 Unspecified atherosclerosis of native arteries of extremities, right leg: Secondary | ICD-10-CM | POA: Diagnosis present

## 2020-07-12 DIAGNOSIS — I10 Essential (primary) hypertension: Secondary | ICD-10-CM | POA: Diagnosis present

## 2020-07-12 DIAGNOSIS — Z86010 Personal history of colonic polyps: Secondary | ICD-10-CM

## 2020-07-12 HISTORY — PX: ABDOMINAL AORTIC ENDOVASCULAR STENT GRAFT: SHX5707

## 2020-07-12 LAB — CBC
HCT: 32.9 % — ABNORMAL LOW (ref 39.0–52.0)
Hemoglobin: 11.4 g/dL — ABNORMAL LOW (ref 13.0–17.0)
MCH: 29.3 pg (ref 26.0–34.0)
MCHC: 34.7 g/dL (ref 30.0–36.0)
MCV: 84.6 fL (ref 80.0–100.0)
Platelets: 98 10*3/uL — ABNORMAL LOW (ref 150–400)
RBC: 3.89 MIL/uL — ABNORMAL LOW (ref 4.22–5.81)
RDW: 13.2 % (ref 11.5–15.5)
WBC: 6 10*3/uL (ref 4.0–10.5)
nRBC: 0 % (ref 0.0–0.2)

## 2020-07-12 LAB — GLUCOSE, CAPILLARY
Glucose-Capillary: 199 mg/dL — ABNORMAL HIGH (ref 70–99)
Glucose-Capillary: 174 mg/dL — ABNORMAL HIGH (ref 70–99)
Glucose-Capillary: 209 mg/dL — ABNORMAL HIGH (ref 70–99)
Glucose-Capillary: 324 mg/dL — ABNORMAL HIGH (ref 70–99)
Glucose-Capillary: 247 mg/dL — ABNORMAL HIGH (ref 70–99)

## 2020-07-12 LAB — CREATININE, SERUM
Creatinine, Ser: 1.23 mg/dL (ref 0.61–1.24)
GFR, Estimated: >60 mL/min (ref >60)

## 2020-07-12 LAB — HEMOGLOBIN A1C
Hgb A1c MFr Bld: 8.5 % — ABNORMAL HIGH (ref 4.8–5.6)
Mean Plasma Glucose: 197.25 mg/dL

## 2020-07-12 LAB — ABO/RH: ABO/RH(D): B POS

## 2020-07-12 LAB — POCT ACTIVATED CLOTTING TIME: Activated Clotting Time: 249 seconds

## 2020-07-12 SURGERY — INSERTION, ENDOVASCULAR STENT GRAFT, AORTA, ABDOMINAL
Anesthesia: General | Laterality: Bilateral

## 2020-07-12 MED ORDER — ONDANSETRON HCL 4 MG/2ML IJ SOLN
INTRAMUSCULAR | Status: DC | PRN
  Administered 2020-07-12: 4 mg via INTRAVENOUS

## 2020-07-12 MED ORDER — SUGAMMADEX SODIUM 200 MG/2ML IV SOLN
INTRAVENOUS | Status: DC | PRN
  Administered 2020-07-12: 200 mg via INTRAVENOUS

## 2020-07-12 MED ORDER — ATENOLOL 25 MG PO TABS
100.0000 mg | ORAL_TABLET | Freq: Every day | ORAL | Status: DC
  Administered 2020-07-13: 100 mg via ORAL
  Filled 2020-07-12: qty 4

## 2020-07-12 MED ORDER — EPHEDRINE 5 MG/ML INJ
INTRAVENOUS | Status: AC
  Filled 2020-07-12: qty 10

## 2020-07-12 MED ORDER — HEPARIN SODIUM (PORCINE) 1000 UNIT/ML IJ SOLN
INTRAMUSCULAR | Status: DC | PRN
  Administered 2020-07-12: 9000 [IU] via INTRAVENOUS
  Administered 2020-07-12: 1000 [IU] via INTRAVENOUS

## 2020-07-12 MED ORDER — BISACODYL 5 MG PO TBEC: 5.0000 mg | DELAYED_RELEASE_TABLET | Freq: Every day | ORAL | Status: DC | PRN

## 2020-07-12 MED ORDER — ALBUMIN HUMAN 5 % IV SOLN: 12.5000 g | Freq: Once | INTRAVENOUS | Status: AC

## 2020-07-12 MED ORDER — ROCURONIUM BROMIDE 10 MG/ML (PF) SYRINGE
PREFILLED_SYRINGE | INTRAVENOUS | Status: AC
  Filled 2020-07-12: qty 10

## 2020-07-12 MED ORDER — OXYCODONE-ACETAMINOPHEN 5-325 MG PO TABS: 1.0000 | ORAL_TABLET | ORAL | Status: DC | PRN

## 2020-07-12 MED ORDER — PROPOFOL 10 MG/ML IV BOLUS
INTRAVENOUS | Status: AC
  Filled 2020-07-12: qty 20

## 2020-07-12 MED ORDER — INSULIN ASPART 100 UNIT/ML ~~LOC~~ SOLN
0.0000 [IU] | Freq: Three times a day (TID) | SUBCUTANEOUS | Status: DC
  Administered 2020-07-12: 11 [IU] via SUBCUTANEOUS
  Administered 2020-07-12: 5 [IU] via SUBCUTANEOUS
  Administered 2020-07-13: 3 [IU] via SUBCUTANEOUS

## 2020-07-12 MED ORDER — ALUM & MAG HYDROXIDE-SIMETH 200-200-20 MG/5ML PO SUSP: 15.0000 mL | ORAL | Status: DC | PRN

## 2020-07-12 MED ORDER — LIDOCAINE 2% (20 MG/ML) 5 ML SYRINGE
INTRAMUSCULAR | Status: AC
  Filled 2020-07-12: qty 5

## 2020-07-12 MED ORDER — CHLORTHALIDONE 25 MG PO TABS
25.0000 mg | ORAL_TABLET | Freq: Every day | ORAL | Status: DC
  Administered 2020-07-13: 25 mg via ORAL
  Filled 2020-07-12: qty 1

## 2020-07-12 MED ORDER — ONDANSETRON HCL 4 MG/2ML IJ SOLN
INTRAMUSCULAR | Status: AC
  Filled 2020-07-12: qty 2

## 2020-07-12 MED ORDER — GUAIFENESIN-DM 100-10 MG/5ML PO SYRP: 15.0000 mL | ORAL_SOLUTION | ORAL | Status: DC | PRN

## 2020-07-12 MED ORDER — POTASSIUM CHLORIDE CRYS ER 20 MEQ PO TBCR
20.0000 meq | EXTENDED_RELEASE_TABLET | Freq: Every day | ORAL | Status: DC
  Administered 2020-07-13: 20 meq via ORAL
  Filled 2020-07-12: qty 1

## 2020-07-12 MED ORDER — PHENYLEPHRINE HCL-NACL 10-0.9 MG/250ML-% IV SOLN
INTRAVENOUS | Status: DC | PRN
  Administered 2020-07-12: 100 ug/min via INTRAVENOUS

## 2020-07-12 MED ORDER — VANCOMYCIN HCL IN DEXTROSE 1-5 GM/200ML-% IV SOLN
1000.0000 mg | INTRAVENOUS | Status: AC
  Administered 2020-07-12: 1000 mg via INTRAVENOUS
  Filled 2020-07-12: qty 200

## 2020-07-12 MED ORDER — CHLORHEXIDINE GLUCONATE CLOTH 2 % EX PADS: 6.0000 | MEDICATED_PAD | Freq: Once | CUTANEOUS | Status: DC

## 2020-07-12 MED ORDER — SODIUM CHLORIDE 0.9 % IV SOLN: INTRAVENOUS | Status: DC

## 2020-07-12 MED ORDER — SODIUM CHLORIDE 0.9 % IV SOLN
INTRAVENOUS | Status: DC | PRN
  Administered 2020-07-12: 500 mL

## 2020-07-12 MED ORDER — FLEET ENEMA 7-19 GM/118ML RE ENEM: 1.0000 | ENEMA | Freq: Once | RECTAL | Status: DC | PRN

## 2020-07-12 MED ORDER — LABETALOL HCL 5 MG/ML IV SOLN: 10.0000 mg | INTRAVENOUS | Status: DC | PRN

## 2020-07-12 MED ORDER — SENNOSIDES-DOCUSATE SODIUM 8.6-50 MG PO TABS: 1.0000 | ORAL_TABLET | Freq: Every evening | ORAL | Status: DC | PRN

## 2020-07-12 MED ORDER — DEXAMETHASONE SODIUM PHOSPHATE 10 MG/ML IJ SOLN
INTRAMUSCULAR | Status: AC
  Filled 2020-07-12: qty 1

## 2020-07-12 MED ORDER — SODIUM CHLORIDE 0.9 % IV SOLN: 500.0000 mL | Freq: Once | INTRAVENOUS | Status: DC | PRN

## 2020-07-12 MED ORDER — VANCOMYCIN HCL 1000 MG/200ML IV SOLN
1000.0000 mg | Freq: Two times a day (BID) | INTRAVENOUS | Status: AC
  Administered 2020-07-12 – 2020-07-13 (×2): 1000 mg via INTRAVENOUS
  Filled 2020-07-12 (×2): qty 200

## 2020-07-12 MED ORDER — DOCUSATE SODIUM 100 MG PO CAPS: 100.0000 mg | ORAL_CAPSULE | Freq: Every day | ORAL | Status: DC

## 2020-07-12 MED ORDER — ACETAMINOPHEN 325 MG PO TABS: 325.0000 mg | ORAL_TABLET | ORAL | Status: DC | PRN

## 2020-07-12 MED ORDER — PHENOL 1.4 % MT LIQD: 1.0000 | OROMUCOSAL | Status: DC | PRN

## 2020-07-12 MED ORDER — POTASSIUM CHLORIDE CRYS ER 20 MEQ PO TBCR
20.0000 meq | EXTENDED_RELEASE_TABLET | Freq: Every day | ORAL | Status: DC | PRN
Start: 2020-07-12 — End: 2020-07-13

## 2020-07-12 MED ORDER — HYDRALAZINE HCL 20 MG/ML IJ SOLN: 5.0000 mg | INTRAMUSCULAR | Status: DC | PRN

## 2020-07-12 MED ORDER — SODIUM CHLORIDE 0.9 % IV SOLN
INTRAVENOUS | Status: AC
  Filled 2020-07-12: qty 1.2

## 2020-07-12 MED ORDER — HEPARIN SODIUM (PORCINE) 5000 UNIT/ML IJ SOLN
5000.0000 [IU] | Freq: Three times a day (TID) | INTRAMUSCULAR | Status: DC
  Administered 2020-07-12 – 2020-07-13 (×3): 5000 [IU] via SUBCUTANEOUS
  Filled 2020-07-12 (×3): qty 1

## 2020-07-12 MED ORDER — PANTOPRAZOLE SODIUM 40 MG PO TBEC
40.0000 mg | DELAYED_RELEASE_TABLET | Freq: Every day | ORAL | Status: DC
  Administered 2020-07-13: 40 mg via ORAL
  Filled 2020-07-12: qty 1

## 2020-07-12 MED ORDER — ATENOLOL-CHLORTHALIDONE 100-25 MG PO TABS: 1.0000 | ORAL_TABLET | Freq: Every day | ORAL | Status: DC

## 2020-07-12 MED ORDER — PROTAMINE SULFATE 10 MG/ML IV SOLN
INTRAVENOUS | Status: DC | PRN
  Administered 2020-07-12: 20 mg via INTRAVENOUS
  Administered 2020-07-12: 10 mg via INTRAVENOUS
  Administered 2020-07-12: 20 mg via INTRAVENOUS

## 2020-07-12 MED ORDER — ACETAMINOPHEN 650 MG RE SUPP
325.0000 mg | RECTAL | Status: DC | PRN
Start: 2020-07-12 — End: 2020-07-13

## 2020-07-12 MED ORDER — IODIXANOL 320 MG/ML IV SOLN
INTRAVENOUS | Status: DC | PRN
  Administered 2020-07-12: 68 mL

## 2020-07-12 MED ORDER — PROPOFOL 10 MG/ML IV BOLUS
INTRAVENOUS | Status: DC | PRN
  Administered 2020-07-12: 40 mg via INTRAVENOUS
  Administered 2020-07-12: 100 mg via INTRAVENOUS

## 2020-07-12 MED ORDER — CHLORHEXIDINE GLUCONATE 0.12 % MT SOLN
15.0000 mL | Freq: Once | OROMUCOSAL | Status: AC
  Administered 2020-07-12: 15 mL via OROMUCOSAL
  Filled 2020-07-12: qty 15

## 2020-07-12 MED ORDER — ASPIRIN EC 81 MG PO TBEC
81.0000 mg | DELAYED_RELEASE_TABLET | Freq: Every day | ORAL | Status: DC
  Administered 2020-07-13: 81 mg via ORAL
  Filled 2020-07-12: qty 1

## 2020-07-12 MED ORDER — ALENDRONATE SODIUM 70 MG PO TABS
70.0000 mg | ORAL_TABLET | ORAL | Status: DC
Start: 2020-07-18 — End: 2020-07-12

## 2020-07-12 MED ORDER — 0.9 % SODIUM CHLORIDE (POUR BTL) OPTIME
TOPICAL | Status: DC | PRN
  Administered 2020-07-12: 1000 mL

## 2020-07-12 MED ORDER — LACTATED RINGERS IV SOLN: INTRAVENOUS | Status: DC

## 2020-07-12 MED ORDER — HYDROMORPHONE HCL 1 MG/ML IJ SOLN: 0.5000 mg | INTRAMUSCULAR | Status: DC | PRN

## 2020-07-12 MED ORDER — FENTANYL CITRATE (PF) 250 MCG/5ML IJ SOLN
INTRAMUSCULAR | Status: AC
  Filled 2020-07-12: qty 5

## 2020-07-12 MED ORDER — ALBUMIN HUMAN 5 % IV SOLN
INTRAVENOUS | Status: AC
  Administered 2020-07-12: 12.5 g via INTRAVENOUS
  Filled 2020-07-12: qty 250

## 2020-07-12 MED ORDER — MIDAZOLAM HCL 5 MG/5ML IJ SOLN
INTRAMUSCULAR | Status: DC | PRN
  Administered 2020-07-12: 2 mg via INTRAVENOUS

## 2020-07-12 MED ORDER — HEPARIN SODIUM (PORCINE) 1000 UNIT/ML IJ SOLN
INTRAMUSCULAR | Status: AC
  Filled 2020-07-12: qty 1

## 2020-07-12 MED ORDER — ONDANSETRON HCL 4 MG/2ML IJ SOLN: 4.0000 mg | Freq: Four times a day (QID) | INTRAMUSCULAR | Status: DC | PRN

## 2020-07-12 MED ORDER — ORAL CARE MOUTH RINSE: 15.0000 mL | Freq: Once | OROMUCOSAL | Status: AC

## 2020-07-12 MED ORDER — DULOXETINE HCL 60 MG PO CPEP
60.0000 mg | ORAL_CAPSULE | Freq: Every day | ORAL | Status: DC
  Administered 2020-07-13: 60 mg via ORAL
  Filled 2020-07-12: qty 1

## 2020-07-12 MED ORDER — TADALAFIL 5 MG PO TABS: 5.0000 mg | ORAL_TABLET | Freq: Every evening | ORAL | Status: DC

## 2020-07-12 MED ORDER — TAMSULOSIN HCL 0.4 MG PO CAPS
0.4000 mg | ORAL_CAPSULE | Freq: Every evening | ORAL | Status: DC
  Administered 2020-07-12: 0.4 mg via ORAL
  Filled 2020-07-12: qty 1

## 2020-07-12 MED ORDER — FENTANYL CITRATE (PF) 250 MCG/5ML IJ SOLN
INTRAMUSCULAR | Status: DC | PRN
  Administered 2020-07-12: 50 ug via INTRAVENOUS

## 2020-07-12 MED ORDER — DEXAMETHASONE SODIUM PHOSPHATE 10 MG/ML IJ SOLN
INTRAMUSCULAR | Status: DC | PRN
  Administered 2020-07-12: 10 mg via INTRAVENOUS

## 2020-07-12 MED ORDER — MIDAZOLAM HCL 2 MG/2ML IJ SOLN
INTRAMUSCULAR | Status: AC
  Filled 2020-07-12: qty 2

## 2020-07-12 MED ORDER — LIDOCAINE 2% (20 MG/ML) 5 ML SYRINGE
INTRAMUSCULAR | Status: DC | PRN
  Administered 2020-07-12: 60 mg via INTRAVENOUS

## 2020-07-12 MED ORDER — ACETAMINOPHEN 500 MG PO TABS
1000.0000 mg | ORAL_TABLET | Freq: Once | ORAL | Status: AC
  Administered 2020-07-12: 1000 mg via ORAL
  Filled 2020-07-12: qty 2

## 2020-07-12 MED ORDER — METOPROLOL TARTRATE 5 MG/5ML IV SOLN
2.0000 mg | INTRAVENOUS | Status: DC | PRN
Start: 2020-07-12 — End: 2020-07-13

## 2020-07-12 MED ORDER — ROSUVASTATIN CALCIUM 5 MG PO TABS
10.0000 mg | ORAL_TABLET | Freq: Every day | ORAL | Status: DC
  Administered 2020-07-13: 10 mg via ORAL
  Filled 2020-07-12: qty 2

## 2020-07-12 MED ORDER — EPHEDRINE SULFATE-NACL 50-0.9 MG/10ML-% IV SOSY
PREFILLED_SYRINGE | INTRAVENOUS | Status: DC | PRN
  Administered 2020-07-12 (×4): 10 mg via INTRAVENOUS

## 2020-07-12 MED ORDER — FENTANYL CITRATE (PF) 100 MCG/2ML IJ SOLN: 25.0000 ug | INTRAMUSCULAR | Status: DC | PRN

## 2020-07-12 MED ORDER — ROCURONIUM BROMIDE 10 MG/ML (PF) SYRINGE
PREFILLED_SYRINGE | INTRAVENOUS | Status: DC | PRN
  Administered 2020-07-12: 60 mg via INTRAVENOUS

## 2020-07-12 MED ORDER — MAGNESIUM SULFATE 2 GM/50ML IV SOLN: 2.0000 g | Freq: Every day | INTRAVENOUS | Status: DC | PRN

## 2020-07-12 MED ORDER — PHENYLEPHRINE 40 MCG/ML (10ML) SYRINGE FOR IV PUSH (FOR BLOOD PRESSURE SUPPORT)
PREFILLED_SYRINGE | INTRAVENOUS | Status: AC
  Filled 2020-07-12: qty 10

## 2020-07-12 MED ORDER — GLIMEPIRIDE 2 MG PO TABS
2.0000 mg | ORAL_TABLET | Freq: Every day | ORAL | Status: DC
  Administered 2020-07-13: 2 mg via ORAL
  Filled 2020-07-12: qty 1

## 2020-07-12 MED ORDER — PHENYLEPHRINE 40 MCG/ML (10ML) SYRINGE FOR IV PUSH (FOR BLOOD PRESSURE SUPPORT)
PREFILLED_SYRINGE | INTRAVENOUS | Status: DC | PRN
  Administered 2020-07-12 (×5): 80 ug via INTRAVENOUS

## 2020-07-12 MED ORDER — PROTAMINE SULFATE 10 MG/ML IV SOLN
INTRAVENOUS | Status: AC
  Filled 2020-07-12: qty 5

## 2020-07-12 SURGICAL SUPPLY — 55 items
ADH SKN CLS APL DERMABOND .7 (GAUZE/BANDAGES/DRESSINGS) ×2
CANISTER SUCT 3000ML PPV (MISCELLANEOUS) ×2 IMPLANT
CATH BEACON 5.038 65CM KMP-01 (CATHETERS) ×1 IMPLANT
CATH OMNI FLUSH .035X70CM (CATHETERS) ×1 IMPLANT
COVER WAND RF STERILE (DRAPES) ×2 IMPLANT
DERMABOND ADVANCED (GAUZE/BANDAGES/DRESSINGS) ×2
DERMABOND ADVANCED .7 DNX12 (GAUZE/BANDAGES/DRESSINGS) ×1 IMPLANT
DEVICE CLOSURE PERCLS PRGLD 6F (VASCULAR PRODUCTS) IMPLANT
DEVICE TORQUE KENDALL .025-038 (MISCELLANEOUS) ×2 IMPLANT
DRSG TEGADERM 2-3/8X2-3/4 SM (GAUZE/BANDAGES/DRESSINGS) ×4 IMPLANT
DRYSEAL FLEXSHEATH 12FR 33CM (SHEATH) ×1
DRYSEAL FLEXSHEATH 16FR 33CM (SHEATH) ×1
ELECT REM PT RETURN 9FT ADLT (ELECTROSURGICAL) ×4
ELECTRODE REM PT RTRN 9FT ADLT (ELECTROSURGICAL) ×2 IMPLANT
EXCLDR TRNK ENDO 20X14.5X12 15 (Endovascular Graft) ×2 IMPLANT
EXCLUDER TNK END 20X14.5X12 15 (Endovascular Graft) IMPLANT
GAUZE 4X4 16PLY RFD (DISPOSABLE) ×1 IMPLANT
GAUZE SPONGE 2X2 8PLY STRL LF (GAUZE/BANDAGES/DRESSINGS) ×1 IMPLANT
GLOVE BIO SURGEON STRL SZ7.5 (GLOVE) ×2 IMPLANT
GLOVE SRG 8 PF TXTR STRL LF DI (GLOVE) ×1 IMPLANT
GLOVE SURG UNDER POLY LF SZ8 (GLOVE) ×2
GOWN STRL REUS W/ TWL LRG LVL3 (GOWN DISPOSABLE) ×3 IMPLANT
GOWN STRL REUS W/ TWL XL LVL3 (GOWN DISPOSABLE) ×1 IMPLANT
GOWN STRL REUS W/TWL LRG LVL3 (GOWN DISPOSABLE) ×6
GOWN STRL REUS W/TWL XL LVL3 (GOWN DISPOSABLE) ×2
GRAFT BALLN CATH 65CM (STENTS) IMPLANT
GUIDEWIRE ANGLED .035X150CM (WIRE) ×2 IMPLANT
KIT BASIN OR (CUSTOM PROCEDURE TRAY) ×2 IMPLANT
KIT TURNOVER KIT B (KITS) ×2 IMPLANT
LEG CONTRALATERAL 16X14.5X10 (Vascular Products) ×1 IMPLANT
LEG CONTRALATERAL 16X14.5X12 (Vascular Products) ×1 IMPLANT
NS IRRIG 1000ML POUR BTL (IV SOLUTION) ×2 IMPLANT
PACK ENDOVASCULAR (PACKS) ×2 IMPLANT
PAD ARMBOARD 7.5X6 YLW CONV (MISCELLANEOUS) ×4 IMPLANT
PERCLOSE PROGLIDE 6F (VASCULAR PRODUCTS) ×16
SET MICROPUNCTURE 5F STIFF (MISCELLANEOUS) ×2 IMPLANT
SHEATH BRITE TIP 8FR 23CM (SHEATH) ×1 IMPLANT
SHEATH DRYSEAL FLEX 12FR 33CM (SHEATH) IMPLANT
SHEATH DRYSEAL FLEX 16FR 33CM (SHEATH) IMPLANT
SHEATH PINNACLE 8F 10CM (SHEATH) ×1 IMPLANT
SPONGE GAUZE 2X2 STER 10/PKG (GAUZE/BANDAGES/DRESSINGS) ×1
STENT GRAFT BALLN CATH 65CM (STENTS)
STOPCOCK MORSE 400PSI 3WAY (MISCELLANEOUS) ×3 IMPLANT
SUT ETHILON 3 0 PS 1 (SUTURE) IMPLANT
SUT MNCRL AB 4-0 PS2 18 (SUTURE) ×3 IMPLANT
SUT PROLENE 5 0 C 1 24 (SUTURE) IMPLANT
SUT VIC AB 2-0 CTX 36 (SUTURE) IMPLANT
SUT VIC AB 3-0 SH 27 (SUTURE) ×2
SUT VIC AB 3-0 SH 27X BRD (SUTURE) IMPLANT
SYR 20ML LL LF (SYRINGE) ×2 IMPLANT
TOWEL GREEN STERILE (TOWEL DISPOSABLE) ×2 IMPLANT
TRAY FOLEY MTR SLVR 16FR STAT (SET/KITS/TRAYS/PACK) ×2 IMPLANT
TUBING HIGH PRESSURE 120CM (CONNECTOR) ×2 IMPLANT
WIRE AMPLATZ SS-J .035X180CM (WIRE) ×2 IMPLANT
WIRE BENTSON .035X145CM (WIRE) ×3 IMPLANT

## 2020-07-12 NOTE — Progress Notes (Signed)
Pt arrived to 4e from PACU. Pt oriented to room and staff. Vitals obtained and stable. CHG bath done. Foley and left a-line in place. Wife at bedside. Pt denies pain.

## 2020-07-12 NOTE — Op Note (Signed)
Date: July 12, 2020  Preoperative diagnosis: Abdominal aortic aneurysm (focal penetrating aortic ulcer)  Postoperative diagnosis: Same  Procedure: 1.  Ultrasound-guided access of bilateral common femoral arteries for delivery of endograft with percutaneous closure 2.  Aortogram including catheter selection of aorta 3.  Endovascular repair of abdominal aortic aneurysm using bifurcated stent graft (aorto-bi-iliac endograft)  Surgeon: Dr. [name], MD  Assistant: Dr. Ruta Hinds, MD  Indications: Patient is a 65 year old male who was recently evaluated in the office after a ultrasound screen from his PCP showed a 4.7 cm distal abdominal aortic aneurysm.  Further CT imaging ordered by the PCP showed a 4.1 cm abdominal aortic aneurysm consistent with saccular aneurysm projecting anteriorly from likely previous penetrating aortic ulcer.  We discussed high risk morphology and stent graft repair is recommended.  He presents today after risk benefits discussed.  An assistant was needed to expedite the case and for wire exchange.  Findings: Ultrasound-guided access of bilateral common femoral arteries for delivery of main body endograft on the left.  We used a 20 mm x 14 mm x 12 cm main body left with a 14 mm x 10 cm bell bottom into the left common iliac artery.  On the right we used a 14 mm x 12 cm bell bottom into the right common iliac artery.  Completion we had excellent seal proximally distally and good flow through the endograft with exclusion of aneurysm.  There was a very delayed type II endoleak from a lumbar.  Anesthesia: General  Details: Patient was taken to the operating room after informed consent was obtained.  Placed on the operative table in supine position and general endotracheal anesthesia was induced.  Bilateral groins and abdominal wall was then prepped and draped in usual sterile fashion.  A timeout was performed to identify patient, procedure and site.  He did get  preoperative antibiotics.  Initially evaluated the left common femoral artery with ultrasound, it was patent and image was saved.  We accessed the left common femoral artery anteriorly with micro access needle and placed a microwire.  I then cut down on the wire with 11 blade and dilated with a hemostat in the subcutaneous tissue over top the artery.  We then dilated with an 8 French dilator and deployed Perclose closure device at 10:00 and 2:00 in the left common femoral artery.  Then placed an 8 [place] sheath.  We then proceeded with the same steps in the right groin again evaluated the right common femoral artery with ultrasound, it was patent, images saved.  Accessed the right common femoral artery with a micro access needle, placed a microwire, cut down on the wire and then dilated with hemostat.  Again placed Perclose devices at 10:00 and 2:00 in the right common femoral artery.  At that point time we then used a KMP catheter to exchange for stiff Amplatz wires in both groins.  We then placed a 16 [place] Gore dry seal sheath in the left common femoral artery and a 12 French Gore dry seal sheath in the right common femoral artery.  Patient was then given 100 units/kg heparin IV and ACT was checked to maintain greater than 250.  That point in time we delivered the main body device on the left which was a 20 mm x 14 mm x 12 cm in the 16 French sheath and the endograft was positioned at the level of L1.  We then put a pigtail catheter up the right sheath and abdominal  aortogram was obtained with 45 degrees of RAO to identify the left renal which was the lowest renal artery.  This was identified and we then deployed the proximal main body device down to the gate.  That point in time we then used a buddy wire coming from the right groin with a Glidewire and KMP to cannulate the gate of the main body graft.  We then spun a pigtail catheter in the proximal main body to confirm we were in the main body of the  endograft.  At that point in time we then put a marker pig and pulled the sheath back and a retrograde right iliac shot was obtained to mark the right hypogastric artery.  We then deployed a right 14 mm x 12 cm bell bottom limb into the right common iliac artery preserving the right hypogastric artery.  We then finished deploying the main body down the left side and then put a marker pig on the left and again got a retrograde shot to identify the left hypogastric and then deployed a 14 mm x 10 cm bell bottom limb into the left common iliac artery.  All overlapping components as well as proximal and distal parts of the endograft were molded with a Q50 balloon.  We shot a final aortogram that showed excellent flow down the endograft with both limbs widely patent and no evidence of endoleak proximally or distally.  There was a very delayed type II endoleak from a small lumbar.  That point in time we exchanged for Bentson wires that initially started in the left groin removing the 16 French sheath while holding manual pressure.  I then deployed the Perclose devices tying down the suture.  I did have to place a third Perclose to get hemostasis in the left groin given one of the Perclose sutures broke.  We then had excellent seal good hemostasis and wire removed.  There was a palpable femoral pulse in the left groin.  We then turned our attention to the right groin which had a more calcified common femoral and ultimately had to place two additional Perclose devices in order to get seal given one of them did not deploy anteriorly due to calcification and then placed a second Perclose tying down the suture and got good hemostasis with a femoral pulse.  That point time wires and catheters and sheaths had all been removed.  We had good PT signals bilaterally.  50 mg protamine was given.  I then cut the sutures in both groins where he had percutaneous access.  The skin was closed with 4-0 Monocryl subcuticular.  Dermabond was  applied.  Taken to recovery in stable condition.  Complication: None  Condition: Stable  [name], MD [facility] [place] Office: [place]

## 2020-07-12 NOTE — H&P (Signed)
History and Physical Interval Note:  07/12/2020 7:22 AM  Aaron Fee Sr.  has presented today for surgery, with the diagnosis of AAA.  The various methods of treatment have been discussed with the patient and family. After consideration of risks, benefits and other options for treatment, the patient has consented to  Procedure(s): ABDOMINAL AORTIC ENDOVASCULAR STENT GRAFT (Bilateral) as a surgical intervention.  The patient's history has been reviewed, patient examined, no change in status, stable for surgery.  I have reviewed the patient's chart and labs.  Questions were answered to the patient's satisfaction.    EVAR for AAA.  Aaron Weaver  Patient name: Aaron TASKER Sr.   MRN: [id number]        DOB: [birth year]        Sex: male  REASON FOR CONSULT: Evaluate abdominal aortic aneurysm  HPI: Aaron ESGUERRA Sr. is a 65 y.o. male, with history of diabetes, hypertension, remote tobacco abuse that presents for evaluation of abdominal aortic aneurysm.  States he had a screening ultrasound through his PCP Dr. Alyson Ingles given his tobacco abuse.  Aortic duplex on 03/21/2020 showed a 4.7 cm distal abdominal aortic aneurysm.  He denies any previous knowledge of this aneurysm.  States his dad did have an aneurysm and he did not have it repaired.  He smoked until about 2 years ago and then quit.  No significant abdominal pain at this time.  Does have history of chronic back pain that is at baseline.  Patient is retired and does live at home with his wife.  He had a CT scan yesterday ordered by his PCP that showed a focal anterior projecting abdominal aortic aneurysm measuring 4.1 cm consistent with ulcerative etiology.      Past Medical History:  Diagnosis Date  . Acute sinusitis, unspecified   . Allergic rhinitis   . Allergy   . Anxiety state, unspecified   . Corns and callosities    toe  . Cough   . Cramp of limb    legs  . Depression   . Impacted cerumen   . Impotence of  organic origin   . Lumbago   . Other and unspecified hyperlipidemia   . Routine general medical examination at a health care facility   . Tobacco use disorder   . Type II or unspecified type diabetes mellitus without mention of complication, not stated as uncontrolled    type II  . Unspecified essential hypertension          Past Surgical History:  Procedure Laterality Date  . CARPECTOMY Right 09/11/2019   Procedure: PROXIMAL ROW CARPECTOMY; RADIAL STYLOIDECTOMY; POSTERIOR INTEROSSIUS NERVE RESECTION;  Surgeon: Daryll Brod, MD;  Location: [facility];  Service: Orthopedics;  Laterality: Right;  AXILLARY BLOCK  . COLONOSCOPY    . INGUINAL HERNIA REPAIR     right  . POLYPECTOMY           Family History  Problem Relation Age of Onset  . Cancer Mother        colon  . Colon cancer Mother 68  . Hypertension Father   . Coronary artery disease Other        male<60 and male<50  1st degree relative  . Colon polyps Brother   . Esophageal cancer Neg Hx   . Rectal cancer Neg Hx   . Stomach cancer Neg Hx     SOCIAL HISTORY: Social History        Socioeconomic History  .  Marital status: Married    Spouse name: Aaron Weaver  . Number of children: Not on file  . Years of education: Not on file  . Highest education level: Not on file  Occupational History  . 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 Smoking status: Former Smoker    Packs/day: 0.50    Years: 0.00    Pack years: 0.00    Types: Cigarettes  . Smokeless tobacco: Never Used  . Tobacco comment: started at age 31.  Vaping Use  . Vaping Use: Never used  Substance and Sexual Activity  . Alcohol use: No  . Drug use: No  . Sexual activity: Yes  Other Topics Concern  . Not on file  Social History Narrative  . Not on file   Social Determinants of Health   Financial Resource Strain: Not on file  Food Insecurity: Not on file  Transportation Needs: Not on  file  Physical Activity: Not on file  Stress: Not on file  Social Connections: Not on file  Intimate Partner Violence: Not on file         Allergies  Allergen Reactions  . Fexofenadine Other (See Comments)    "dries me out too much"  . Hydrochlorothiazide Other (See Comments)    REACTION: cramps  . Loratadine Other (See Comments)    sleepy  . Penicillin V Other (See Comments)  . Penicillins Hives  . Pravastatin Sodium Other (See Comments)    REACTION: aches  . Pravastatin Sodium Other (See Comments)    REACTION: aches  . Requip [Ropinirole Hcl] Other (See Comments)    Bad dreams  . Sudafed [Pseudoephedrine] Other (See Comments)    dry          Current Outpatient Medications  Medication Sig Dispense Refill  . atenolol-chlorthalidone (TENORETIC) 100-25 MG per tablet TAKE 1 TABLET BY MOUTH DAILY. (Patient taking differently: Take 1 tablet by mouth daily.) 30 tablet 0  . Cholecalciferol (VITAMIN D3) 1000 UNITS tablet Take 1,000 Units by mouth daily.    . DULoxetine (CYMBALTA) 60 MG capsule Take 60 mg by mouth daily.    . Fish Oil OIL Take 1 tablet by mouth 2 (two) times daily.    Marland Kitchen glimepiride (AMARYL) 2 MG tablet Take 1 tablet (2 mg total) by mouth daily before breakfast. 90 tablet 3  . JANUMET XR 50-1000 MG TB24 Take 1 tablet by mouth at bedtime.     . potassium chloride SA (KLOR-CON M20) 20 MEQ tablet TAKE 1 TABLET (20 MEQ TOTAL) BY MOUTH DAILY.  (Patient taking differently: Take 20 mEq by mouth daily.) 30 tablet 5  . quinapril (ACCUPRIL) 20 MG tablet TAKE 1 TABLET BY MOUTH EVERY DAY (Patient taking differently: Take 20 mg by mouth daily.) 90 tablet 0  . aspirin 81 MG EC tablet Take 1 tablet (81 mg total) by mouth daily. 30 tablet 0  . oxyCODONE-acetaminophen (PERCOCET) 7.5-325 MG tablet Take 1 tablet by mouth every 4 (four) hours as needed for severe pain. 30 tablet 0            Current Facility-Administered Medications  Medication Dose Route  Frequency Provider Last Rate Last Admin  . 0.9 %  sodium chloride infusion  500 mL Intravenous Once Ladene Artist, MD        REVIEW OF SYSTEMS:  [X]  denotes positive finding, [ ]  denotes negative finding Cardiac  Comments:  Chest pain or chest pressure:    Shortness of breath upon exertion:  Short of breath when lying flat:    Irregular heart rhythm:        Vascular    Pain in calf, thigh, or hip brought on by ambulation:    Pain in feet at night that wakes you up from your sleep:     Blood clot in your veins:    Leg swelling:         Pulmonary    Oxygen at home:    Productive cough:     Wheezing:         Neurologic    Sudden weakness in arms or legs:     Sudden numbness in arms or legs:     Sudden onset of difficulty speaking or slurred speech:    Temporary loss of vision in one eye:     Problems with dizziness:         Gastrointestinal    Blood in stool:     Vomited blood:         Genitourinary    Burning when urinating:     Blood in urine:        Psychiatric    Major depression:         Hematologic    Bleeding problems:    Problems with blood clotting too easily:        Skin    Rashes or ulcers:        Constitutional    Fever or chills:      PHYSICAL EXAM:    Vitals:   06/15/20 1213  Resp: 18  Weight: 187 lb (84.8 kg)  Height: 6' (1.829 m)    GENERAL: The patient is a well-nourished male, in no acute distress. The vital signs are documented above. CARDIAC: There is a regular rate and rhythm.  VASCULAR:  Palpable femoral pulses bilaterally Palpable PT pulses bilaterally PULMONARY: There is good air exchange bilaterally without wheezing or rales. ABDOMEN: Soft and non-tender.  No pain with deep palpation. MUSCULOSKELETAL: There are no major deformities or cyanosis. NEUROLOGIC: No focal weakness or paresthesias are detected. SKIN: There are  no ulcers or rashes noted. PSYCHIATRIC: The patient has a normal affect.  DATA:   CTA on my review shows a saccular aneurysm projecting from the anterior wall of the infrarenal abdominal aorta consistent with a previous penetrating aortic ulcer and maximal diameter is 4.1 cm  Assessment/Plan:  65 year old male presents for evaluation of abdominal aortic aneurysm noted on screening ultrasound ordered by his PCP Dr. Alyson Ingles.  Screening US showed a distal 4.7 cm AAA.  I reviewed his CTA from yesterday ordered by Dr. 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  I discussed with him and his wife in detail that generally this morphology is considered higher risk and likely should entertain stent graft repair.  He has good access and I think would be a good candidate for stent graft repair.  I discussed the steps of the operation with him and his wife.  Risk and benefits were discussed in detail.  She wants to wait until after March 1 given her mom has had some health issues which is understandable.  Will likely get him scheduled for 07/12/2020.   Aaron Heck, MD [facility] Office: [phone]

## 2020-07-12 NOTE — Transfer of Care (Signed)
Immediate Anesthesia Transfer of Care Note  Patient: Aaron Fee Sr.  Procedure(s) Performed: ABDOMINAL AORTIC ENDOVASCULAR STENT GRAFT (Bilateral )  Patient Location: PACU  Anesthesia Type:General  Level of Consciousness: awake, alert , oriented and patient cooperative  Airway & Oxygen Therapy: Patient Spontanous Breathing and Patient connected to face mask oxygen  Post-op Assessment: Report given to RN and Post -op Vital signs reviewed and stable  Post vital signs: Reviewed and stable  Last Vitals:  Vitals Value Taken Time  BP 98/54 07/12/20 0933  Temp    Pulse 69 07/12/20 0935  Resp 17 07/12/20 0935  SpO2 100 % 07/12/20 0935  Vitals shown include unvalidated device data.  Last Pain:  Vitals:   07/12/20 0619  TempSrc:   PainSc: 0-No pain      Patients Stated Pain Goal: 2 (09/23/31 5825)  Complications: No complications documented.

## 2020-07-12 NOTE — Anesthesia Procedure Notes (Signed)
Arterial Line Insertion Start/End3/11/2020 7:00 AM, 07/12/2020 7:10 AM Performed by: Griffin Dakin, CRNA, CRNA  Patient location: Pre-op. Preanesthetic checklist: patient identified, IV checked, risks and benefits discussed, surgical consent, monitors and equipment checked, pre-op evaluation and timeout performed Lidocaine 1% used for infiltration Left, radial was placed Catheter size: 20 G Hand hygiene performed  and maximum sterile barriers used  Allen's test indicative of satisfactory collateral circulation Attempts: 1 Procedure performed without using ultrasound guided technique. Following insertion, dressing applied and Biopatch. Post procedure assessment: normal  Patient tolerated the procedure well with no immediate complications.

## 2020-07-12 NOTE — Anesthesia Procedure Notes (Signed)
Procedure Name: Intubation Date/Time: 07/12/2020 7:40 AM Performed by: Myna Bright, CRNA Pre-anesthesia Checklist: Patient identified, Emergency Drugs available, Suction available and Patient being monitored Patient Re-evaluated:Patient Re-evaluated prior to induction Oxygen Delivery Method: Circle system utilized Preoxygenation: Pre-oxygenation with 100% oxygen Induction Type: IV induction Ventilation: Mask ventilation without difficulty and Oral airway inserted - appropriate to patient size Laryngoscope Size: Mac and 4 Grade View: Grade II Tube type: Oral Number of attempts: 1 Airway Equipment and Method: Stylet Placement Confirmation: ETT inserted through vocal cords under direct vision,  positive ETCO2 and breath sounds checked- equal and bilateral Secured at: 22 cm Tube secured with: Tape Dental Injury: Teeth and Oropharynx as per pre-operative assessment

## 2020-07-12 NOTE — Anesthesia Postprocedure Evaluation (Signed)
Anesthesia Post Note  Patient: Aaron CAMPUSANO Sr.  Procedure(s) Performed: ABDOMINAL AORTIC ENDOVASCULAR STENT GRAFT (Bilateral )     Patient location during evaluation: PACU Anesthesia Type: General Level of consciousness: awake and alert Pain management: pain level controlled Vital Signs Assessment: post-procedure vital signs reviewed and stable Respiratory status: spontaneous breathing, nonlabored ventilation and respiratory function stable Cardiovascular status: blood pressure returned to baseline and stable Postop Assessment: no apparent nausea or vomiting Anesthetic complications: no   No complications documented.  Last Vitals:  Vitals:   07/12/20 1018 07/12/20 1041  BP: 106/72 101/64  Pulse: 67 69  Resp: 16 16  Temp:    SpO2: 99%     Last Pain:  Vitals:   07/12/20 1041  TempSrc:   PainSc: 0-No pain                 Carlyle Achenbach,W. EDMOND

## 2020-07-12 NOTE — Progress Notes (Signed)
Mobility Specialist: Progress Note   07/12/20 1733  Mobility  Activity Ambulated in hall  Level of Assistance Modified independent, requires aide device or extra time  Assistive Device Front wheel walker  Distance Ambulated (ft) 470 ft  Mobility Response Tolerated well  Mobility performed by Mobility specialist  $Mobility charge 1 Mobility   Pre-Mobility: 80 HR Post-Mobility: 84 HR, 120/83 BP, 98% SpO2  Pt experienced posterior lean when standing at EOB and needed to sit back down. Pt was able to stand back up and walk using RW. Pt asx during ambulation. Pt back to bed after walk to eat dinner.   [facility] Kareli Hossain Mobility Specialist Mobility Specialist Phone: [phone]

## 2020-07-13 ENCOUNTER — Encounter (HOSPITAL_COMMUNITY): Payer: Self-pay | Admitting: Vascular Surgery

## 2020-07-13 LAB — BASIC METABOLIC PANEL
Anion gap: 9 (ref 5–15)
BUN: 23 mg/dL (ref 8–23)
CO2: 24 mmol/L (ref 22–32)
Calcium: 8.8 mg/dL — ABNORMAL LOW (ref 8.9–10.3)
Chloride: 103 mmol/L (ref 98–111)
Creatinine, Ser: 1.03 mg/dL (ref 0.61–1.24)
GFR, Estimated: >60 mL/min (ref >60)
Glucose, Bld: 178 mg/dL — ABNORMAL HIGH (ref 70–99)
Potassium: 3.7 mmol/L (ref 3.5–5.1)
Sodium: 136 mmol/L (ref 135–145)

## 2020-07-13 LAB — CBC
HCT: 30.5 % — ABNORMAL LOW (ref 39.0–52.0)
Hemoglobin: 11 g/dL — ABNORMAL LOW (ref 13.0–17.0)
MCH: 29.6 pg (ref 26.0–34.0)
MCHC: 36.1 g/dL — ABNORMAL HIGH (ref 30.0–36.0)
MCV: 82 fL (ref 80.0–100.0)
Platelets: 116 10*3/uL — ABNORMAL LOW (ref 150–400)
RBC: 3.72 MIL/uL — ABNORMAL LOW (ref 4.22–5.81)
RDW: 13.1 % (ref 11.5–15.5)
WBC: 8.8 10*3/uL (ref 4.0–10.5)
nRBC: 0 % (ref 0.0–0.2)

## 2020-07-13 LAB — GLUCOSE, CAPILLARY: Glucose-Capillary: 151 mg/dL — ABNORMAL HIGH (ref 70–99)

## 2020-07-13 MED ORDER — ASPIRIN 81 MG PO TBEC
81.0000 mg | DELAYED_RELEASE_TABLET | Freq: Every day | ORAL | 11 refills | Status: AC
Start: 2020-07-14 — End: ?

## 2020-07-13 MED ORDER — CHLORHEXIDINE GLUCONATE CLOTH 2 % EX PADS
6.0000 | MEDICATED_PAD | Freq: Every day | CUTANEOUS | Status: DC
  Administered 2020-07-13: 6 via TOPICAL

## 2020-07-13 MED ORDER — JANUMET XR 50-1000 MG PO TB24: 2.0000 | ORAL_TABLET | Freq: Every day | ORAL | Status: DC

## 2020-07-13 NOTE — Discharge Summary (Signed)
[facility] Discharge Summary   Patient ID:  Aaron DONAGHUE Sr. MRN: [id number] DOB/AGE: 11-Feb-1956 65 y.o.  Admit date: 07/12/2020 Discharge date: 07/13/2020 Date of Surgery: 07/12/2020 Surgeon: Surgeon(s): [name], MD Elam Dutch, MD  Admission Diagnosis: AAA (abdominal aortic aneurysm) [facility]) [I71.4]  Discharge Diagnoses:  AAA (abdominal aortic aneurysm) ([place]) [I71.4]  Secondary Diagnoses: Past Medical History:  Diagnosis Date  . Acute sinusitis, unspecified   . Allergic rhinitis   . Allergy   . Anxiety state, unspecified   . COPD (chronic obstructive pulmonary disease) ([place])   . Corns and callosities    toe  . Cough   . Cramp of limb    legs  . Depression   . Dysrhythmia   . Impacted cerumen   . Impotence of organic origin   . Lumbago   . Other and unspecified hyperlipidemia   . Routine general medical examination at a health care facility   . Tobacco use disorder   . Type II or unspecified type diabetes mellitus without mention of complication, not stated as uncontrolled    type II  . Unspecified essential hypertension     Procedure(s): ABDOMINAL AORTIC ENDOVASCULAR STENT GRAFT  Discharged Condition: stable  HPI: Aaron Weaveris a 64 y.o.male,with history of diabetes, hypertension,remotetobacco abuse that presents for evaluation of abdominal aortic aneurysm. States he had a screening ultrasound through his PCP Dr. Lollie Sails his tobacco abuse. Aorticduplex on 11/14/2021showed a 4.7 cm distal abdominal aortic aneurysm.   CTA from 3/6/22ordered by Dr. 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   Hospital Course:  Aaron SPADAFORE Sr. is a 65 y.o. male is S/P  Procedure(s): ABDOMINAL AORTIC ENDOVASCULAR STENT GRAFT Post he was in stable condition.  B groins soft without hematoma.  Feet warm well  perfused with intact motor and sensation.    No deficits, ambulating, tol PO's Stable disposition for discharge Hold Janumet after angiogram/EVAR for 48 hours post op(metformin).  Significant Diagnostic Studies: CBC Lab Results  Component Value Date   WBC 8.8 07/13/2020   HGB 11.0 (L) 07/13/2020   HCT 30.5 (L) 07/13/2020   MCV 82.0 07/13/2020   PLT 116 (L) 07/13/2020    BMET    Component Value Date/Time   NA 136 07/13/2020 0422   K 3.7 07/13/2020 0422   CL 103 07/13/2020 0422   CO2 24 07/13/2020 0422   GLUCOSE 178 (H) 07/13/2020 0422   BUN 23 07/13/2020 0422   CREATININE 1.03 07/13/2020 0422   CALCIUM 8.8 (L) 07/13/2020 0422   GFRNONAA >60 07/13/2020 0422   GFRAA >60 09/08/2019 1200   COAG Lab Results  Component Value Date   INR 1.0 07/07/2020   INR 0.97 06/14/2018     Disposition:  Discharge to :Home Discharge Instructions    Call MD for:  redness, tenderness, or signs of infection (pain, swelling, bleeding, redness, odor or green/yellow discharge around incision site)   Complete by: As directed    Call MD for:  severe or increased pain, loss or decreased feeling  in affected limb(s)   Complete by: As directed    Call MD for:  temperature >100.5   Complete by: As directed    Resume previous diet   Complete by: As directed      Allergies as of 07/13/2020      Reactions   Fexofenadine Other (See Comments)   "  dries me out too much"   Hydrochlorothiazide Other (See Comments)   REACTION: cramps   Loratadine Other (See Comments)   sleepy   Penicillin V Other (See Comments)   Penicillins Hives   Pravastatin Sodium Other (See Comments)   REACTION: aches   Pravastatin Sodium Other (See Comments)   REACTION: aches   Requip [ropinirole Hcl] Other (See Comments)   Bad dreams   Sudafed [pseudoephedrine] Other (See Comments)   dry      Medication List    TAKE these medications   alendronate 70 MG tablet Commonly known as: FOSAMAX Take 70 mg by mouth every  Sunday.   aspirin 81 MG EC tablet Take 1 tablet (81 mg total) by mouth daily at 6 (six) AM. Swallow whole. Start taking on: July 14, 2020   atenolol-chlorthalidone 100-25 MG tablet Commonly known as: TENORETIC TAKE 1 TABLET BY MOUTH DAILY.   calcium carbonate 1500 (600 Ca) MG Tabs tablet Commonly known as: OSCAL Take 600 mg of elemental calcium by mouth every evening.   DULoxetine 60 MG capsule Commonly known as: CYMBALTA Take 60 mg by mouth daily.   Fish Oil Oil Take 1,500 mg by mouth daily.   glimepiride 2 MG tablet Commonly known as: AMARYL Take 1 tablet (2 mg total) by mouth daily before breakfast. What changed: when to take this   Janumet XR 50-1000 MG Tb24 Generic drug: SitaGLIPtin-MetFORMIN HCl Take 2 tablets by mouth at bedtime. Start taking on: July 14, 2020   omeprazole 40 MG capsule Commonly known as: PRILOSEC Take 40 mg by mouth daily.   potassium chloride SA 20 MEQ tablet Commonly known as: Klor-Con M20 TAKE 1 TABLET (20 MEQ TOTAL) BY MOUTH DAILY.   rosuvastatin 10 MG tablet Commonly known as: CRESTOR Take 10 mg by mouth daily.   tadalafil 5 MG tablet Commonly known as: CIALIS Take 5 mg by mouth every evening.   tamsulosin 0.4 MG Caps capsule Commonly known as: FLOMAX Take 0.4 mg by mouth every evening.   Zinc 50 MG Tabs Take 50 mg by mouth daily.      Verbal and written Discharge instructions given to the patient. Wound care per Discharge AVS  Follow-up Information    [name], MD In 4 weeks.   Specialty: Vascular Surgery Why: Office will call you to arrange your appt (sent) Contact information: [facility] [place] [address] [phone]               Signed: Roxy Horseman 07/13/2020, 10:09 AM - For VQI Registry use --- Instructions: Press F2 to tab through selections.  Delete question if not applicable.   Post-op:  Time to Extubation: [x ] In OR, [ ]  < 12 hrs, [ ]  12-24 hrs, [ ]  >=24 hrs Vasopressors  Req. Post-op: No MI: [x ] No, [ ]  Troponin only, [ ]  EKG or Clinical New Arrhythmia: No CHF: No ICU Stay:  days Transfusion: No  If yes, 0 units given  Complications: Resp failure: [x ] none, [ ]  Pneumonia, [ ]  Ventilator Chg in renal function: [x ] none, [ ]  Inc. Cr > 0.5, [ ]  Temp. Dialysis, [ ]  Permanent dialysis Leg ischemia: [x ] No, [ ]  Yes, no Surgery needed, [ ]  Yes, Surgery needed, [ ]  Amputation Bowel ischemia: [x ] No, [ ]  Medical Rx, [ ]  Surgical Rx Wound complication: [x ] No, [ ]  Superficial separation/infection, [ ]  Return to OR Return to OR: No  Return to OR  for bleeding: No Stroke: [x ] None, [ ]  Minor, [ ]  Major  Discharge medications: Statin use:  Yes ASA use:  Yes Plavix use:  No  for medical reason   Beta blocker use:  Yes

## 2020-07-13 NOTE — Progress Notes (Addendum)
[facility]  Subjective  - Doing well.  No pain, ambulated and voided.   Objective 115/77 83 97.6 F (36.4 C) (Oral) 16 96%  Intake/Output Summary (Last 24 hours) at 07/13/2020 0718 Last data filed at 07/13/2020 0600 Gross per 24 hour  Intake 3831.31 ml  Output 2896 ml  Net 935.31 ml    Feet warm well perfused, motor intact and sensation B groins soft without hematomas Abdomin soft, NTTP Lungs non labored breathing   Assessment/Planning: POD # 1 EVAR  Cr 1.03, voiding No deficits, ambulating, tol PO's Stable disposition for discharge Hold Janumet after angiogram/EVAR for 48 hours post op(metformin).  Roxy Horseman 07/13/2020 7:18 AM --  Laboratory Lab Results: Recent Labs    07/12/20 1311 07/13/20 0422  WBC 6.0 8.8  HGB 11.4* 11.0*  HCT 32.9* 30.5*  PLT 98* 116*   BMET Recent Labs    07/12/20 1102 07/13/20 0422  NA  --  136  K  --  3.7  CL  --  103  CO2  --  24  GLUCOSE  --  178*  BUN  --  23  CREATININE 1.23 1.03  CALCIUM  --  8.8*    COAG Lab Results  Component Value Date   INR 1.0 07/07/2020   INR 0.97 06/14/2018   No results found for: PTT   I have seen and evaluated the patient. I agree with the PA note as documented above.  Postop day 1 status post EVAR for abdominal aortic aneurysm.  Both groins look good with no hematoma.  Palpable pedal pulses.  Plan discharge on aspirin statin.  Will arrange follow-up in 1 month with CTA abdomen pelvis.  [name], MD [facility] [place] Office: [phone]

## 2020-07-13 NOTE — Progress Notes (Signed)
Plan of care reviewed. Pt's progressing, alert and oriented x4.  Pt's hemodynamically stable, afebrile, NSR on monitor, normal respiration, lungs clear bilaterally, no distress noted.   Pt slept well, denied pain. Right and left groin had no bleeding, negative for hematoma. Got partent signals on DP and PT by doppler bilaterally. We will continue to monitor.  Kennyth Lose, RN

## 2020-07-13 NOTE — Plan of Care (Signed)

## 2020-07-13 NOTE — Progress Notes (Signed)
Patient and wife given discharge instructions and stated understanding.

## 2020-07-13 NOTE — Discharge Instructions (Signed)
   [facility]   Discharge Instructions  Endovascular Aortic Aneurysm Repair  Please refer to the following instructions for your post-procedure care. Your surgeon or Physician Assistant will discuss any changes with you.  Activity  You are encouraged to walk as much as you can. You can slowly return to normal activities but must avoid strenuous activity and heavy lifting until your doctor tells you it's OK. Avoid activities such as vacuuming or swinging a gold club. It is normal to feel tired for several weeks after your surgery. Do not drive until your doctor gives the OK and you are no longer taking prescription pain medications. It is also normal to have difficulty with sleep habits, eating, and bowel movements after surgery. These will go away with time.  Bathing/Showering  You may shower after you go home. If you have an incision, do not soak in a bathtub, hot tub, or swim until the incision heals completely.  Incision Care  Shower every day. Clean your incision with mild soap and water. Pat the area dry with a clean towel. You do not need a bandage unless otherwise instructed. Do not apply any ointments or creams to your incision. If you clothing is irritating, you may cover your incision with a dry gauze pad.  Diet  Resume your normal diet. There are no special food restrictions following this procedure. A low fat/low cholesterol diet is recommended for all patients with vascular disease. In order to heal from your surgery, it is CRITICAL to get adequate nutrition. Your body requires vitamins, minerals, and protein. Vegetables are the best source of vitamins and minerals. Vegetables also provide the perfect balance of protein. Processed food has little nutritional value, so try to avoid this.  Medications  Resume taking all of your medications unless your doctor or nurse practitioner tells you not to. If your incision is causing pain, you may take  over-the-counter pain relievers such as acetaminophen (Tylenol). If you were prescribed a stronger pain medication, please be aware these medications can cause nausea and constipation. Prevent nausea by taking the medication with a snack or meal. Avoid constipation by drinking plenty of fluids and eating foods with a high amount of fiber, such as fruits, vegetables, and grains. Do not take Tylenol if you are taking prescription pain medications.   Follow up  Our office will schedule a follow-up appointment with a C.T. scan 3-4 weeks after your surgery.  Please call us immediately for any of the following conditions  Severe or worsening pain in your legs or feet or in your abdomen back or chest. Increased pain, redness, drainage (pus) from your incision sit. Increased abdominal pain, bloating, nausea, vomiting or persistent diarrhea. Fever of 101 degrees or higher. Swelling in your leg (s),  Reduce your risk of vascular disease  Stop smoking. If you would like help call [facility] at 1-800-QUIT-NOW (1-800-784-8669) or [place] at 336-586-4000. Manage your cholesterol Maintain a desired weight Control your diabetes Keep your blood pressure down  If you have questions, please call the office at [phone].   

## 2020-07-15 ENCOUNTER — Other Ambulatory Visit: Payer: Self-pay

## 2020-07-15 DIAGNOSIS — E119 Type 2 diabetes mellitus without complications: Secondary | ICD-10-CM | POA: Diagnosis not present

## 2020-07-15 DIAGNOSIS — I713 Abdominal aortic aneurysm, ruptured, unspecified: Secondary | ICD-10-CM

## 2020-07-15 DIAGNOSIS — Z87891 Personal history of nicotine dependence: Secondary | ICD-10-CM | POA: Diagnosis not present

## 2020-07-15 DIAGNOSIS — R531 Weakness: Secondary | ICD-10-CM | POA: Diagnosis not present

## 2020-07-15 DIAGNOSIS — Z7984 Long term (current) use of oral hypoglycemic drugs: Secondary | ICD-10-CM | POA: Diagnosis not present

## 2020-07-15 DIAGNOSIS — J449 Chronic obstructive pulmonary disease, unspecified: Secondary | ICD-10-CM | POA: Diagnosis not present

## 2020-07-15 DIAGNOSIS — Z95828 Presence of other vascular implants and grafts: Secondary | ICD-10-CM | POA: Diagnosis not present

## 2020-07-15 DIAGNOSIS — I10 Essential (primary) hypertension: Secondary | ICD-10-CM | POA: Diagnosis not present

## 2020-07-15 DIAGNOSIS — Z48812 Encounter for surgical aftercare following surgery on the circulatory system: Secondary | ICD-10-CM | POA: Diagnosis not present

## 2020-07-15 DIAGNOSIS — I714 Abdominal aortic aneurysm, without rupture: Secondary | ICD-10-CM | POA: Diagnosis not present

## 2020-07-16 ENCOUNTER — Other Ambulatory Visit: Payer: Self-pay

## 2020-07-16 ENCOUNTER — Telehealth: Payer: Self-pay

## 2020-07-16 ENCOUNTER — Observation Stay (HOSPITAL_COMMUNITY)
Admission: AD | Admit: 2020-07-16 | Discharge: 2020-07-17 | Disposition: A | Discharge status: Home / Self Care | Payer: Medicare Other | Source: Ambulatory Visit | Attending: Vascular Surgery | Admitting: Vascular Surgery

## 2020-07-16 ENCOUNTER — Ambulatory Visit: Payer: Medicare Other | Admitting: Physician Assistant

## 2020-07-16 ENCOUNTER — Ambulatory Visit: Payer: Medicare Other

## 2020-07-16 VITALS — BP 87/54 | HR 50 | Temp 97.9°F | Resp 20 | Ht 72.0 in | Wt 186.0 lb

## 2020-07-16 DIAGNOSIS — I714 Abdominal aortic aneurysm, without rupture, unspecified: Secondary | ICD-10-CM

## 2020-07-16 DIAGNOSIS — Z95828 Presence of other vascular implants and grafts: Secondary | ICD-10-CM

## 2020-07-16 DIAGNOSIS — M79651 Pain in right thigh: Secondary | ICD-10-CM | POA: Diagnosis present

## 2020-07-16 DIAGNOSIS — I9581 Postprocedural hypotension: Principal | ICD-10-CM | POA: Insufficient documentation

## 2020-07-16 DIAGNOSIS — M79652 Pain in left thigh: Secondary | ICD-10-CM | POA: Insufficient documentation

## 2020-07-16 DIAGNOSIS — Z791 Long term (current) use of non-steroidal anti-inflammatories (NSAID): Secondary | ICD-10-CM | POA: Diagnosis not present

## 2020-07-16 DIAGNOSIS — Z79899 Other long term (current) drug therapy: Secondary | ICD-10-CM | POA: Diagnosis not present

## 2020-07-16 LAB — CBC
HCT: 36.3 % — ABNORMAL LOW (ref 39.0–52.0)
Hemoglobin: 12.9 g/dL — ABNORMAL LOW (ref 13.0–17.0)
MCH: 29.5 pg (ref 26.0–34.0)
MCHC: 35.5 g/dL (ref 30.0–36.0)
MCV: 83.1 fL (ref 80.0–100.0)
Platelets: 189 10*3/uL (ref 150–400)
RBC: 4.37 MIL/uL (ref 4.22–5.81)
RDW: 13.6 % (ref 11.5–15.5)
WBC: 8.3 10*3/uL (ref 4.0–10.5)
nRBC: 0 % (ref 0.0–0.2)

## 2020-07-16 LAB — BASIC METABOLIC PANEL
Anion gap: 10 (ref 5–15)
BUN: 34 mg/dL — ABNORMAL HIGH (ref 8–23)
CO2: 26 mmol/L (ref 22–32)
Calcium: 8.6 mg/dL — ABNORMAL LOW (ref 8.9–10.3)
Chloride: 98 mmol/L (ref 98–111)
Creatinine, Ser: 1.59 mg/dL — ABNORMAL HIGH (ref 0.61–1.24)
GFR, Estimated: 48 mL/min — ABNORMAL LOW (ref >60)
Glucose, Bld: 171 mg/dL — ABNORMAL HIGH (ref 70–99)
Potassium: 3.4 mmol/L — ABNORMAL LOW (ref 3.5–5.1)
Sodium: 134 mmol/L — ABNORMAL LOW (ref 135–145)

## 2020-07-16 MED ORDER — ACETAMINOPHEN 650 MG RE SUPP: 325.0000 mg | RECTAL | Status: DC | PRN

## 2020-07-16 MED ORDER — PHENOL 1.4 % MT LIQD: 1.0000 | OROMUCOSAL | Status: DC | PRN

## 2020-07-16 MED ORDER — LABETALOL HCL 5 MG/ML IV SOLN: 10.0000 mg | INTRAVENOUS | Status: DC | PRN

## 2020-07-16 MED ORDER — GUAIFENESIN-DM 100-10 MG/5ML PO SYRP: 15.0000 mL | ORAL_SOLUTION | ORAL | Status: DC | PRN

## 2020-07-16 MED ORDER — ALUM & MAG HYDROXIDE-SIMETH 200-200-20 MG/5ML PO SUSP: 15.0000 mL | ORAL | Status: DC | PRN

## 2020-07-16 MED ORDER — POLYETHYLENE GLYCOL 3350 17 G PO PACK: 17.0000 g | PACK | Freq: Every day | ORAL | Status: DC | PRN

## 2020-07-16 MED ORDER — HYDRALAZINE HCL 20 MG/ML IJ SOLN: 5.0000 mg | INTRAMUSCULAR | Status: DC | PRN

## 2020-07-16 MED ORDER — SODIUM CHLORIDE 0.9 % IV SOLN: INTRAVENOUS | Status: DC

## 2020-07-16 MED ORDER — OXYCODONE-ACETAMINOPHEN 5-325 MG PO TABS: 1.0000 | ORAL_TABLET | Freq: Four times a day (QID) | ORAL | Status: DC | PRN

## 2020-07-16 MED ORDER — ONDANSETRON HCL 4 MG/2ML IJ SOLN: 4.0000 mg | Freq: Four times a day (QID) | INTRAMUSCULAR | Status: DC | PRN

## 2020-07-16 MED ORDER — METOPROLOL TARTRATE 5 MG/5ML IV SOLN: 2.0000 mg | INTRAVENOUS | Status: DC | PRN

## 2020-07-16 MED ORDER — ACETAMINOPHEN 325 MG PO TABS: 325.0000 mg | ORAL_TABLET | ORAL | Status: DC | PRN

## 2020-07-16 MED ORDER — BISACODYL 10 MG RE SUPP: 10.0000 mg | Freq: Every day | RECTAL | Status: DC | PRN

## 2020-07-16 NOTE — Progress Notes (Signed)
Pt direct admit. VSS CHG given and IV placed in Left hand. Mild ecchymosis bilateral groins   Will continue to monitor   [name] Aaron Weaver

## 2020-07-16 NOTE — Addendum Note (Signed)
Addended by: Gabriel Earing on: 07/16/2020 03:19 PM   Modules accepted: Orders, SmartSet

## 2020-07-16 NOTE — Telephone Encounter (Signed)
Pt's is s/p EVAR on 07/12/20 and has c/o bilateral muscle leg pain since d/c home that comes and goes and is very painful. He stated it is relieved when he lays down. Pt states incision looks good; had [place] nurse visit yesterday. Pt also has a b/p of 88/53 today per his wife. MD has been made aware and wants pt to f/u today in office with PA. Pt has been advised to drink water. He has had some Coke this morning but not much water. Pt will f/u this afternoon with PA.

## 2020-07-16 NOTE — Progress Notes (Signed)
POST OPERATIVE OFFICE NOTE    CC:  F/u for surgery  HPI:  This is a 65 y.o. male who is s/p EVAR on 07/12/2020 by Dr. Carlis Abbott..    Pt called triage today with c/o bilateral thigh pain.  He was brought in to be evaluated.  He states that he started having burning in both thighs on Wednesday.  He states that it is intermittent.  Sometimes when he gets up, it is difficult to stand.  He does not have any pain in his feet. He does not have any pain in his groins or his abdomen.  He states that his blood pressure has been low.  He has been dizzy.  His PCP told him to drink plenty of fluid.  He does take Tenoretic.    Allergies  Allergen Reactions  . Fexofenadine Other (See Comments)    "dries me out too much"  . Hydrochlorothiazide Other (See Comments)    REACTION: cramps  . Loratadine Other (See Comments)    sleepy  . Penicillin V Other (See Comments)  . Penicillins Hives  . Pravastatin Sodium Other (See Comments)    REACTION: aches  . Pravastatin Sodium Other (See Comments)    REACTION: aches  . Requip [Ropinirole Hcl] Other (See Comments)    Bad dreams  . Sudafed [Pseudoephedrine] Other (See Comments)    dry    Current Outpatient Medications  Medication Sig Dispense Refill  . alendronate (FOSAMAX) 70 MG tablet Take 70 mg by mouth every Sunday.    Marland Kitchen aspirin EC 81 MG EC tablet Take 1 tablet (81 mg total) by mouth daily at 6 (six) AM. Swallow whole. 30 tablet 11  . atenolol-chlorthalidone (TENORETIC) 100-25 MG per tablet TAKE 1 TABLET BY MOUTH DAILY. (Patient taking differently: Take 1 tablet by mouth daily.) 30 tablet 0  . calcium carbonate (OSCAL) 1500 (600 Ca) MG TABS tablet Take 600 mg of elemental calcium by mouth every evening.    . DULoxetine (CYMBALTA) 60 MG capsule Take 60 mg by mouth daily.    . Fish Oil OIL Take 1,500 mg by mouth daily.    Marland Kitchen glimepiride (AMARYL) 2 MG tablet Take 1 tablet (2 mg total) by mouth daily before breakfast. (Patient taking differently: Take 2 mg by  mouth in the morning and at bedtime.) 90 tablet 3  . JANUMET XR 50-1000 MG TB24 Take 2 tablets by mouth at bedtime. 30 tablet   . omeprazole (PRILOSEC) 40 MG capsule Take 40 mg by mouth daily.    . potassium chloride SA (KLOR-CON M20) 20 MEQ tablet TAKE 1 TABLET (20 MEQ TOTAL) BY MOUTH DAILY.  (Patient taking differently: Take 20 mEq by mouth daily.) 30 tablet 5  . rosuvastatin (CRESTOR) 10 MG tablet Take 10 mg by mouth daily.    . tadalafil (CIALIS) 5 MG tablet Take 5 mg by mouth every evening.    . tamsulosin (FLOMAX) 0.4 MG CAPS capsule Take 0.4 mg by mouth every evening.    . Zinc 50 MG TABS Take 50 mg by mouth daily.     Current Facility-Administered Medications  Medication Dose Route Frequency Provider Last Rate Last Admin  . 0.9 %  sodium chloride infusion  500 mL Intravenous Once Ladene Artist, MD         ROS:  See HPI  Physical Exam:  Today's Vitals   07/16/20 1408  BP: (!) 87/54  Pulse: (!) 50  Resp: 20  Temp: 97.9 F (36.6 C)  SpO2: 98%  Weight: 186 lb (84.4 kg)  Height: 6' (1.829 m)  PainSc: 8    Body mass index is 25.23 kg/m.   Incision:  Bilateral groins are soft with some ecchymosis.  Trivial/small hematoma right groin. Extremities:  +palpable bilateral femoral, PT, DP pulses.   Abdomen:  Soft, NT/ND; aortic pulse is not palpable.    Assessment/Plan:  This is a 65 y.o. male who is s/p: EVAR on 07/12/2020 by Dr. Carlis Abbott.  -pt with intermittent burning in thighs as well as symptomatic hypotension.  Discussed with Dr. Carlis Abbott and will admit pt to hospital for observation for IVF and labs.   Most likely discharge tomorrow if labs ok and pt feeling better. -hold atenolol/diuretic for now   Leontine Locket, [facility] [phone]   Clinic MD:  Donzetta Matters on call MD

## 2020-07-17 ENCOUNTER — Telehealth: Payer: Self-pay | Admitting: Cardiology

## 2020-07-17 DIAGNOSIS — I714 Abdominal aortic aneurysm, without rupture: Secondary | ICD-10-CM | POA: Diagnosis not present

## 2020-07-17 DIAGNOSIS — M79652 Pain in left thigh: Secondary | ICD-10-CM | POA: Diagnosis not present

## 2020-07-17 DIAGNOSIS — Z79899 Other long term (current) drug therapy: Secondary | ICD-10-CM | POA: Diagnosis not present

## 2020-07-17 DIAGNOSIS — Z791 Long term (current) use of non-steroidal anti-inflammatories (NSAID): Secondary | ICD-10-CM | POA: Diagnosis not present

## 2020-07-17 DIAGNOSIS — I9581 Postprocedural hypotension: Secondary | ICD-10-CM | POA: Diagnosis not present

## 2020-07-17 LAB — CBC
HCT: 30.7 % — ABNORMAL LOW (ref 39.0–52.0)
Hemoglobin: 11.1 g/dL — ABNORMAL LOW (ref 13.0–17.0)
MCH: 29.7 pg (ref 26.0–34.0)
MCHC: 36.2 g/dL — ABNORMAL HIGH (ref 30.0–36.0)
MCV: 82.1 fL (ref 80.0–100.0)
Platelets: 139 10*3/uL — ABNORMAL LOW (ref 150–400)
RBC: 3.74 MIL/uL — ABNORMAL LOW (ref 4.22–5.81)
RDW: 13.4 % (ref 11.5–15.5)
WBC: 6 10*3/uL (ref 4.0–10.5)
nRBC: 0 % (ref 0.0–0.2)

## 2020-07-17 LAB — BASIC METABOLIC PANEL
Anion gap: 8 (ref 5–15)
BUN: 24 mg/dL — ABNORMAL HIGH (ref 8–23)
CO2: 25 mmol/L (ref 22–32)
Calcium: 8.3 mg/dL — ABNORMAL LOW (ref 8.9–10.3)
Chloride: 99 mmol/L (ref 98–111)
Creatinine, Ser: 1.13 mg/dL (ref 0.61–1.24)
GFR, Estimated: >60 mL/min (ref >60)
Glucose, Bld: 285 mg/dL — ABNORMAL HIGH (ref 70–99)
Potassium: 3.3 mmol/L — ABNORMAL LOW (ref 3.5–5.1)
Sodium: 132 mmol/L — ABNORMAL LOW (ref 135–145)

## 2020-07-17 MED ORDER — POTASSIUM CHLORIDE ER 10 MEQ PO TBCR
30.0000 meq | EXTENDED_RELEASE_TABLET | Freq: Once | ORAL | Status: AC
  Administered 2020-07-17: 30 meq via ORAL
  Filled 2020-07-17 (×2): qty 3

## 2020-07-17 NOTE — Progress Notes (Signed)
Discharged to home with family office visits in place teaching done  

## 2020-07-17 NOTE — Discharge Instructions (Signed)
Dehydration, Adult Dehydration is condition in which there is not enough water or other fluids in the body. This happens when a person loses more fluids than he or she takes in. Important body parts cannot work right without the right amount of fluids. Any loss of fluids from the body can cause dehydration. Dehydration can be mild, worse, or very bad. It should be treated right away to keep it from getting very bad. What are the causes? This condition may be caused by:  Conditions that cause loss of water or other fluids, such as: ? Watery poop (diarrhea). ? Vomiting. ? Sweating a lot. ? Peeing (urinating) a lot.  Not drinking enough fluids, especially when you: ? Are ill. ? Are doing things that take a lot of energy to do.  Other illnesses and conditions, such as fever or infection.  Certain medicines, such as medicines that take extra fluid out of the body (diuretics).  Lack of safe drinking water.  Not being able to get enough water and food. What increases the risk? The following factors may make you more likely to develop this condition:  Having a long-term (chronic) illness that has not been treated the right way, such as: ? Diabetes. ? Heart disease. ? Kidney disease.  Being 65 years of age or older.  Having a disability.  Living in a place that is high above the ground or sea (high in altitude). The thinner, dried air causes more fluid loss.  Doing exercises that put stress on your body for a long time. What are the signs or symptoms? Symptoms of dehydration depend on how bad it is. Mild or worse dehydration  Thirst.  Dry lips or dry mouth.  Feeling dizzy or light-headed, especially when you stand up from sitting.  Muscle cramps.  Your body making: ? Dark pee (urine). Pee may be the color of tea. ? Less pee than normal. ? Less tears than normal.  Headache. Very bad dehydration  Changes in skin. Skin may: ? Be cold to the touch (clammy). ? Be blotchy  or pale. ? Not go back to normal right after you lightly pinch it and let it go.  Little or no tears, pee, or sweat.  Changes in vital signs, such as: ? Fast breathing. ? Low blood pressure. ? Weak pulse. ? Pulse that is more than 100 beats a minute when you are sitting still.  Other changes, such as: ? Feeling very thirsty. ? Eyes that look hollow (sunken). ? Cold hands and feet. ? Being mixed up (confused). ? Being very tired (lethargic) or having trouble waking from sleep. ? Short-term weight loss. ? Loss of consciousness. How is this treated? Treatment for this condition depends on how bad it is. Treatment should start right away. Do not wait until your condition gets very bad. Very bad dehydration is an emergency. You will need to go to a hospital.  Mild or worse dehydration can be treated at home. You may be asked to: ? Drink more fluids. ? Drink an oral rehydration solution (ORS). This drink helps get the right amounts of fluids and salts and minerals in the blood (electrolytes).  Very bad dehydration can be treated: ? With fluids through an IV tube. ? By getting normal levels of salts and minerals in your blood. This is often done by giving salts and minerals through a tube. The tube is passed through your nose and into your stomach. ? By treating the root cause. Follow these instructions at   home: Oral rehydration solution If told by your doctor, drink an ORS:  Make an ORS. Use instructions on the package.  Start by drinking small amounts, about  cup (120 mL) every 5-10 minutes.  Slowly drink more until you have had the amount that your doctor said to have. Eating and drinking  Drink enough clear fluid to keep your pee pale yellow. If you were told to drink an ORS, finish the ORS first. Then, start slowly drinking other clear fluids. Drink fluids such as: ? Water. Do not drink only water. Doing that can make the salt (sodium) level in your body get too low. ? Water  from ice chips you suck on. ? Fruit juice that you have added water to (diluted). ? Low-calorie sports drinks.  Eat foods that have the right amounts of salts and minerals, such as: ? Bananas. ? Oranges. ? Potatoes. ? Tomatoes. ? Spinach.  Do not drink alcohol.  Avoid: ? Drinks that have a lot of sugar. These include:  High-calorie sports drinks.  Fruit juice that you did not add water to.  Soda.  Caffeine. ? Foods that are greasy or have a lot of fat or sugar.         General instructions  Take over-the-counter and prescription medicines only as told by your doctor.  Do not take salt tablets. Doing that can make the salt level in your body get too high.  Return to your normal activities as told by your doctor. Ask your doctor what activities are safe for you.  Keep all follow-up visits as told by your doctor. This is important. Contact a doctor if:  You have pain in your belly (abdomen) and the pain: ? Gets worse. ? Stays in one place.  You have a rash.  You have a stiff neck.  You get angry or annoyed (irritable) more easily than normal.  You are more tired or have a harder time waking than normal.  You feel: ? Weak or dizzy. ? Very thirsty. Get help right away if you have:  Any symptoms of very bad dehydration.  Symptoms of vomiting, such as: ? You cannot eat or drink without vomiting. ? Your vomiting gets worse or does not go away. ? Your vomit has blood or green stuff in it.  Symptoms that get worse with treatment.  A fever.  A very bad headache.  Problems with peeing or pooping (having a bowel movement), such as: ? Watery poop that gets worse or does not go away. ? Blood in your poop (stool). This may cause poop to look black and tarry. ? Not peeing in 6-8 hours. ? Peeing only a small amount of very dark pee in 6-8 hours.  Trouble breathing. These symptoms may be an emergency. Do not wait to see if the symptoms will go away. Get  medical help right away. Call your local emergency services (911 in the [place] Do not drive yourself to the hospital. Summary  Dehydration is a condition in which there is not enough water or other fluids in the body. This happens when a person loses more fluids than he or she takes in.  Treatment for this condition depends on how bad it is. Treatment should be started right away. Do not wait until your condition gets very bad.  Drink enough clear fluid to keep your pee pale yellow. If you were told to drink an oral rehydration solution (ORS), finish the ORS first. Then, start slowly drinking other clear fluids.    Take over-the-counter and prescription medicines only as told by your doctor.  Get help right away if you have any symptoms of very bad dehydration. This information is not intended to replace advice given to you by your health care provider. Make sure you discuss any questions you have with your health care provider. Document Revised: 12/05/2018 Document Reviewed: 12/05/2018 Elsevier Patient Education  2021 Elsevier Inc.  

## 2020-07-17 NOTE — Progress Notes (Signed)
Patient is S/P AAA repair, PVC on telemetry. EKG, PACs and PVCs (2). Non specific changes. No ST-T abnormality to suggest ischemia and no symptoms.   I will set up OV. KCL 30 mEq ordered.   Adrian Prows, MD, [facility] 07/17/2020, 12:03 PM Office: [phone] Pager: [phone]

## 2020-07-17 NOTE — Discharge Summary (Signed)
Discharge Summary  Patient ID: Aaron Weaver [id number] 65 y.o. Apr 15, 1956  Admit date: 07/16/2020  Discharge date and time: 07/17/2020  3:02 PM   Admitting Physician: [name], MD   Discharge Physician: V.W. 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 No signs of malperfusion. IVF resuscitation. Elevated creatinine: 1.59 up from 1.03. Improved after hydration.  Multiple PVCs: get 12 lead EKG, cardiology consultation. Potassium repleted. Pain resolved and BP normalized.  Consults: cardiology  Treatments: IV hydration  Discharge Exam:  Vitals:   07/17/20 0804 07/17/20 1137  BP: 129/69 135/77  Pulse: 60 88  Resp: 18 19  Temp: 98.8 F (37.1 C) 97.6 F (36.4 C)  SpO2: 97% 97%   General appearance: Awake, alert in no apparent distress Cardiac: Heart rate regular with frequent PVCs Respirations: Nonlabored Incisions: Bilateral groin puncture sites are soft with ecchymosis. Extremities: Both feet are warm with intact sensation and motor function. Palpable pedal pulses bilaterally.  Disposition: Discharge disposition: 01-Home or Self Care       Patient Instructions:  Allergies as of 07/17/2020      Reactions   Fexofenadine Other (See Comments)   "dries me out too much"   Hydrochlorothiazide Other (See Comments)   REACTION: cramps   Loratadine Other (See Comments)   sleepy   Penicillin V Other (See Comments)   Penicillins Hives   Pravastatin Sodium Other (See Comments)   REACTION: aches   Pravastatin Sodium Other (See Comments)   REACTION: aches   Requip [ropinirole Hcl] Other  (See Comments)   Bad dreams   Sudafed [pseudoephedrine] Other (See Comments)   dry      Medication List    TAKE these medications   acetaminophen 325 MG tablet Commonly known as: TYLENOL Take 650 mg by mouth as needed for mild pain, moderate pain or headache.   alendronate 70 MG tablet Commonly known as: FOSAMAX Take 70 mg by mouth every Sunday.   aspirin 81 MG EC tablet Take 1 tablet (81 mg total) by mouth daily at 6 (six) AM. Swallow whole.   atenolol-chlorthalidone 100-25 MG tablet Commonly known as: TENORETIC TAKE 1 TABLET BY MOUTH DAILY.   calcium carbonate 1500 (600 Ca) MG Tabs tablet Commonly known as: OSCAL Take 600 mg of elemental calcium by mouth every evening.   DULoxetine 60 MG capsule Commonly known as: CYMBALTA Take 60 mg by mouth at bedtime.   Fish Oil Oil Take 1,500 mg by mouth daily.   glimepiride 2 MG tablet Commonly known as: AMARYL Take 1 tablet (2 mg total) by mouth daily before breakfast. What changed: when to take this   Janumet XR 50-1000 MG Tb24 Generic drug: SitaGLIPtin-MetFORMIN HCl Take 2 tablets by mouth at bedtime.   omeprazole 40 MG capsule Commonly known as: PRILOSEC Take 40 mg by mouth daily.   potassium chloride SA 20 MEQ tablet Commonly known as: [place]-Con M20 TAKE 1 TABLET (20 MEQ TOTAL) BY MOUTH DAILY.   rosuvastatin 10 MG tablet Commonly known as: CRESTOR Take 10 mg by mouth daily.   tadalafil 5 MG tablet Commonly known as: CIALIS Take 5 mg by mouth daily.   tamsulosin 0.4 MG Caps capsule Commonly known as: FLOMAX Take 0.4 mg by  mouth every evening.   Zinc 50 MG Tabs Take 50 mg by mouth daily.      Activity: activity as tolerated Diet: cardiac diet Wound Care: none needed  Follow-up with Dr. Carlis Abbott in 3 weeks.  Signed: Barbie Banner, PA-C 07/17/2020 3:36 PM [facility] Office: [phone]

## 2020-07-17 NOTE — Progress Notes (Addendum)
   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 No signs of malperfusion  Elevated creatinine: 1.59 up from 1.03. Continue IVFs.  Multiple PVCs: get 12 lead EKG  Dipso:  At discharge, recommend resuming his home medications and to follow-up with PCP in the next two weeks to follow-up  meds and BP.  Encouraged attention to hydration and urine output. He has follow-up appointment arranged with Dr. Carlis Abbott on 08/10/2020.   SUBJECTIVE:   Admitted from office secondary to bilateral thigh pain and hypotension. This has resolved. I noted PVCs on telemetry and he states he has always had this and that he has had work-up in the past. He states this is routinely noted on physical exams. PVCs noted on anesthesia record from 07/11/2020.  PHYSICAL EXAM:   Vitals:   07/16/20 1947 07/16/20 2316 07/17/20 0312 07/17/20 0804  BP: 123/76 (!) 112/56 124/70 129/69  Pulse: 82 81 80 60  Resp: 19 20 20 18   Temp: 98.7 F (37.1 C) 99.1 F (37.3 C) 97.7 F (36.5 C) 98.8 F (37.1 C)  TempSrc: Oral Oral Oral Oral  SpO2: 99% 99% 99% 97%   Tele: PVCs  General appearance: Awake, alert in no apparent distress Cardiac: Heart rate and rhythm are regular with frequent PVCs Respirations: Nonlabored Incisions: Bilateral groin puncture sites are soft with ecchymosis. Extremities: Both feet are warm with intact sensation and motor function. Palpable pedal pulses bilaterally.  LABS:   Lab Results  Component Value Date   WBC 8.3 07/16/2020   HGB 12.9 (L) 07/16/2020   HCT 36.3 (L) 07/16/2020   MCV 83.1 07/16/2020   PLT 189 07/16/2020   Lab Results  Component Value Date   CREATININE 1.59 (H) 07/16/2020   Lab Results  Component Value Date   INR 1.0 07/07/2020   CBG (last 3)  No results for input(s): GLUCAP in the last 72 hours.  PROBLEM LIST:    Active Problems:   Hypotension after  procedure   CURRENT MEDS:     Barbie Banner, PA-C Office: [phone] 07/17/2020   I agree with the above.  I have seen and evaluated the patient.  On exam, both groins are soft without hematoma.  He does have some ecchymosis.  He has palpable pedal pulses.  He states he is feeling much better today.  I suspect that all of his symptoms were related to dehydration.  His creatinine is back to baseline.  His hemoglobin is stable.  He was having frequent PVCs.  The case was discussed with Dr. Einar Gip.  He will follow-up with him as an outpatient.  He is stable for discharge.  Annamarie Major

## 2020-07-18 NOTE — H&P (Signed)
POST OPERATIVE OFFICE NOTE    CC:  F/u for surgery  HPI:  This is a 65 y.o. male who is s/p EVAR on 07/12/2020 by Dr. Carlis Abbott..    Pt called triage today with c/o bilateral thigh pain.  He was brought in to be evaluated.  He states that he started having burning in both thighs on Wednesday.  He states that it is intermittent.  Sometimes when he gets up, it is difficult to stand.  He does not have any pain in his feet. He does not have any pain in his groins or his abdomen.  He states that his blood pressure has been low.  He has been dizzy.  His PCP told him to drink plenty of fluid.  He does take Tenoretic.         Allergies  Allergen Reactions  . Fexofenadine Other (See Comments)    "dries me out too much"  . Hydrochlorothiazide Other (See Comments)    REACTION: cramps  . Loratadine Other (See Comments)    sleepy  . Penicillin V Other (See Comments)  . Penicillins Hives  . Pravastatin Sodium Other (See Comments)    REACTION: aches  . Pravastatin Sodium Other (See Comments)    REACTION: aches  . Requip [Ropinirole Hcl] Other (See Comments)    Bad dreams  . Sudafed [Pseudoephedrine] Other (See Comments)    dry          Current Outpatient Medications  Medication Sig Dispense Refill  . alendronate (FOSAMAX) 70 MG tablet Take 70 mg by mouth every Sunday.    Marland Kitchen aspirin EC 81 MG EC tablet Take 1 tablet (81 mg total) by mouth daily at 6 (six) AM. Swallow whole. 30 tablet 11  . atenolol-chlorthalidone (TENORETIC) 100-25 MG per tablet TAKE 1 TABLET BY MOUTH DAILY. (Patient taking differently: Take 1 tablet by mouth daily.) 30 tablet 0  . calcium carbonate (OSCAL) 1500 (600 Ca) MG TABS tablet Take 600 mg of elemental calcium by mouth every evening.    . DULoxetine (CYMBALTA) 60 MG capsule Take 60 mg by mouth daily.    . Fish Oil OIL Take 1,500 mg by mouth daily.    Marland Kitchen glimepiride (AMARYL) 2 MG tablet Take 1 tablet (2 mg total) by mouth daily before breakfast.  (Patient taking differently: Take 2 mg by mouth in the morning and at bedtime.) 90 tablet 3  . JANUMET XR 50-1000 MG TB24 Take 2 tablets by mouth at bedtime. 30 tablet   . omeprazole (PRILOSEC) 40 MG capsule Take 40 mg by mouth daily.    . potassium chloride SA (KLOR-CON M20) 20 MEQ tablet TAKE 1 TABLET (20 MEQ TOTAL) BY MOUTH DAILY.  (Patient taking differently: Take 20 mEq by mouth daily.) 30 tablet 5  . rosuvastatin (CRESTOR) 10 MG tablet Take 10 mg by mouth daily.    . tadalafil (CIALIS) 5 MG tablet Take 5 mg by mouth every evening.    . tamsulosin (FLOMAX) 0.4 MG CAPS capsule Take 0.4 mg by mouth every evening.    . Zinc 50 MG TABS Take 50 mg by mouth daily.              Current Facility-Administered Medications  Medication Dose Route Frequency Provider Last Rate Last Admin  . 0.9 %  sodium chloride infusion  500 mL Intravenous Once Ladene Artist, MD         ROS:  See HPI  Physical Exam:     Today's Vitals  07/16/20 1408  BP: (!) 87/54  Pulse: (!) 50  Resp: 20  Temp: 97.9 F (36.6 C)  SpO2: 98%  Weight: 186 lb (84.4 kg)  Height: 6' (1.829 m)  PainSc: 8    Body mass index is 25.23 kg/m.   Incision:  Bilateral groins are soft with some ecchymosis.  Trivial/small hematoma right groin. Extremities:  +palpable bilateral femoral, PT, DP pulses.   Abdomen:  Soft, NT/ND; aortic pulse is not palpable.    Assessment/Plan:  This is a 65 y.o. male who is s/p: EVAR on 07/12/2020 by Dr. Carlis Abbott.  -pt with intermittent burning in thighs as well as symptomatic hypotension.  Discussed with Dr. Carlis Abbott and will admit pt to hospital for observation for IVF and labs.   Most likely discharge tomorrow if labs ok and pt feeling better. -hold atenolol/diuretic for now   Leontine Locket, [facility] [phone]

## 2020-07-22 ENCOUNTER — Ambulatory Visit: Payer: Medicare Other | Admitting: Cardiology

## 2020-07-22 ENCOUNTER — Encounter: Payer: Self-pay | Admitting: Cardiology

## 2020-07-22 ENCOUNTER — Other Ambulatory Visit: Payer: Self-pay

## 2020-07-22 VITALS — BP 143/92 | HR 109 | Temp 98.0°F | Resp 17 | Ht 72.0 in | Wt 190.8 lb

## 2020-07-22 DIAGNOSIS — Z9889 Other specified postprocedural states: Secondary | ICD-10-CM | POA: Diagnosis not present

## 2020-07-22 DIAGNOSIS — R0609 Other forms of dyspnea: Secondary | ICD-10-CM

## 2020-07-22 DIAGNOSIS — E119 Type 2 diabetes mellitus without complications: Secondary | ICD-10-CM

## 2020-07-22 DIAGNOSIS — I10 Essential (primary) hypertension: Secondary | ICD-10-CM | POA: Diagnosis not present

## 2020-07-22 DIAGNOSIS — Z8679 Personal history of other diseases of the circulatory system: Secondary | ICD-10-CM

## 2020-07-22 DIAGNOSIS — I493 Ventricular premature depolarization: Secondary | ICD-10-CM | POA: Diagnosis not present

## 2020-07-22 DIAGNOSIS — R0600 Dyspnea, unspecified: Secondary | ICD-10-CM

## 2020-07-22 DIAGNOSIS — Z794 Long term (current) use of insulin: Secondary | ICD-10-CM | POA: Diagnosis not present

## 2020-07-22 MED ORDER — LOSARTAN POTASSIUM 50 MG PO TABS: 50.0000 mg | ORAL_TABLET | Freq: Every evening | ORAL | 2 refills | Status: DC

## 2020-07-22 NOTE — Progress Notes (Signed)
Primary Physician/Referring:  [name], MD  Patient ID: Aaron Fee Sr., male    DOB: January 03, 1956, 65 y.o.   MRN: [id number]  Chief Complaint  Patient presents with  . New Patient (Initial Visit)  . Abnormal ECG  . Hypertension   HPI:    Aaron GARRIGA Sr.  is a 65 y.o. patient male with hypertension, hyperlipidemia, controlled diabetes mellitus, prior tobacco use disorder quit smoking in 2020, admitted for AAA repair.  Underwent endovascular repair of abdominal aortic aneurysm using bifurcated stent graft (aortobiiliac endograft) on 07/12/2020.  During recovery and following day, he had frequent PVCs noted on the telemetry, because of his multiple cardiovascular factors he was referred by Dr. Rock Nephew problem for further cardiac risk stratification and evaluation.  Patient has remained stable since surgery, states that he has not had any complications.  He has decreased physical activity due to marked dyspnea due to underlying COPD but denies any associated chest pain or palpitations, dizziness or syncope.  Denies symptoms of claudication.  Past Medical History:  Diagnosis Date  . AAA (abdominal aortic aneurysm) ([place])   . Acute sinusitis, unspecified   . Allergic rhinitis   . Allergy   . Anxiety state, unspecified   . COPD (chronic obstructive pulmonary disease) ([place])   . Corns and callosities    toe  . Cough   . Cramp of limb    legs  . Depression   . Dysrhythmia   . Impacted cerumen   . Impotence of organic origin   . Lumbago   . Other and unspecified hyperlipidemia   . Routine general medical examination at a health care facility   . Tobacco use disorder   . Type II or unspecified type diabetes mellitus without mention of complication, not stated as uncontrolled    type II  . Unspecified essential hypertension    Past Surgical History:  Procedure Laterality Date  . ABDOMINAL AORTIC ENDOVASCULAR STENT GRAFT Bilateral 07/12/2020   Procedure: ABDOMINAL AORTIC  ENDOVASCULAR STENT GRAFT;  Surgeon: [name], MD;  Location: [place];  Service: Vascular;  Laterality: Bilateral;  . BACK SURGERY    . CARPECTOMY Right 09/11/2019   Procedure: PROXIMAL ROW CARPECTOMY; RADIAL STYLOIDECTOMY; POSTERIOR INTEROSSIUS NERVE RESECTION;  Surgeon: Daryll Brod, MD;  Location: [place];  Service: Orthopedics;  Laterality: Right;  AXILLARY BLOCK  . COLONOSCOPY    . INGUINAL HERNIA REPAIR     right  . POLYPECTOMY    . TONSILLECTOMY     Family History  Problem Relation Age of Onset  . Cancer Mother        colon  . Colon cancer Mother 62  . Hypertension Father   . Coronary artery disease Other        male<60 and male<50  1st degree relative  . Colon polyps Brother   . Esophageal cancer Neg Hx   . Rectal cancer Neg Hx   . Stomach cancer Neg Hx     Social History   Tobacco Use  . Smoking status: Former Smoker    Packs/day: 0.50    Years: 0.00    Pack years: 0.00    Types: Cigarettes    Quit date: 07/22/2017    Years since quitting: 3.0  . Smokeless tobacco: Never Used  . Tobacco comment: started at age 32.  Substance Use Topics  . Alcohol use: No   Marital Status: Married  ROS  Review of Systems  Cardiovascular: Positive for dyspnea on  exertion. Negative for chest pain and leg swelling.  Respiratory: Positive for wheezing.   Gastrointestinal: Negative for melena.   Objective  Blood pressure (!) 143/92, pulse (!) 109, temperature 98 F (36.7 C), temperature source Temporal, resp. rate 17, height 6' (1.829 m), weight 190 lb 12.8 oz (86.5 kg), SpO2 98 %.  Vitals with BMI 07/22/2020 07/22/2020 07/17/2020  Height - '6\' 0"'  -  Weight - 190 lbs 13 oz -  BMI - 55.37 -  Systolic 482 707 867  Diastolic 92 92 77  Pulse 544 103 88     Physical Exam Cardiovascular:     Rate and Rhythm: Normal rate and regular rhythm.     Pulses: Intact distal pulses.     Heart sounds: Normal heart sounds. No murmur heard. No gallop.      Comments:  No leg edema, no JVD. Pulmonary:     Effort: Pulmonary effort is normal.     Breath sounds: Normal breath sounds.  Abdominal:     General: Bowel sounds are normal.     Palpations: Abdomen is soft.  Skin:    General: Skin is warm and dry.     Capillary Refill: Capillary refill takes less than 2 seconds.  Neurological:     General: No focal deficit present.     Mental Status: He is oriented to person, place, and time.    Laboratory examination:   Recent Labs    09/08/19 1200 07/07/20 1423 07/13/20 0422 07/16/20 1910 07/17/20 1105  NA 138   < > 136 134* 132*  K 5.1   < > 3.7 3.4* 3.3*  CL 107   < > 103 98 99  CO2 23   < > '24 26 25  ' GLUCOSE 221*   < > 178* 171* 285*  BUN 31*   < > 23 34* 24*  CREATININE 1.24   < > 1.03 1.59* 1.13  CALCIUM 9.0   < > 8.8* 8.6* 8.3*  GFRNONAA >60   < > >60 48* >60  GFRAA >60  --   --   --   --    < > = values in this interval not displayed.   estimated creatinine clearance is 70.6 mL/min (by C-G formula based on SCr of 1.13 mg/dL).  CMP Latest Ref Rng & Units 07/17/2020 07/16/2020 07/13/2020  Glucose 70 - 99 mg/dL 285(H) 171(H) 178(H)  BUN 8 - 23 mg/dL 24(H) 34(H) 23  Creatinine 0.61 - 1.24 mg/dL 1.13 1.59(H) 1.03  Sodium 135 - 145 mmol/L 132(L) 134(L) 136  Potassium 3.5 - 5.1 mmol/L 3.3(L) 3.4(L) 3.7  Chloride 98 - 111 mmol/L 99 98 103  CO2 22 - 32 mmol/L '25 26 24  ' Calcium 8.9 - 10.3 mg/dL 8.3(L) 8.6(L) 8.8(L)  Total Protein 6.5 - 8.1 g/dL - - -  Total Bilirubin 0.3 - 1.2 mg/dL - - -  Alkaline Phos 38 - 126 U/L - - -  AST 15 - 41 U/L - - -  ALT 0 - 44 U/L - - -   CBC Latest Ref Rng & Units 07/17/2020 07/16/2020 07/13/2020  WBC 4.0 - 10.5 K/uL 6.0 8.3 8.8  Hemoglobin 13.0 - 17.0 g/dL 11.1(L) 12.9(L) 11.0(L)  Hematocrit 39.0 - 52.0 % 30.7(L) 36.3(L) 30.5(L)  Platelets 150 - 400 K/uL 139(L) 189 116(L)   HEMOGLOBIN A1C Lab Results  Component Value Date   HGBA1C 8.5 (H) 07/12/2020   MPG 197.25 07/12/2020   External labs:     Labs  04/26/2020:  A1c 7.0%.  Hb 14.6/HCT 41.2, platelets 138.  BUN 21, creatinine 1.11, EGFR 66 mL, potassium 4.4.  Total cholesterol 97, triglycerides 187, HDL 31, LDL 35.  Medications and allergies   Allergies  Allergen Reactions  . Fexofenadine Other (See Comments)    "dries me out too much"  . Hydrochlorothiazide Other (See Comments)    REACTION: cramps  . Loratadine Other (See Comments)    sleepy  . Penicillin V Other (See Comments)  . Penicillins Hives  . Pravastatin Sodium Other (See Comments)    REACTION: aches  . Pravastatin Sodium Other (See Comments)    REACTION: aches  . Requip [Ropinirole Hcl] Other (See Comments)    Bad dreams  . Sudafed [Pseudoephedrine] Other (See Comments)    dry     Outpatient Medications Prior to Visit  Medication Sig  . acetaminophen (TYLENOL) 325 MG tablet Take 650 mg by mouth as needed for mild pain, moderate pain or headache.  . alendronate (FOSAMAX) 70 MG tablet Take 70 mg by mouth every Sunday.  Marland Kitchen aspirin EC 81 MG EC tablet Take 1 tablet (81 mg total) by mouth daily at 6 (six) AM. Swallow whole.  . calcium carbonate (OSCAL) 1500 (600 Ca) MG TABS tablet Take 600 mg of elemental calcium by mouth every evening.  . DULoxetine (CYMBALTA) 60 MG capsule Take 60 mg by mouth at bedtime.  . Fish Oil OIL Take 1,500 mg by mouth daily.  Marland Kitchen glimepiride (AMARYL) 2 MG tablet Take 1 tablet (2 mg total) by mouth daily before breakfast. (Patient taking differently: Take 2 mg by mouth in the morning and at bedtime.)  . JANUMET XR 50-1000 MG TB24 Take 2 tablets by mouth at bedtime.  Marland Kitchen omeprazole (PRILOSEC) 40 MG capsule Take 40 mg by mouth daily.  . potassium chloride SA (KLOR-CON M20) 20 MEQ tablet TAKE 1 TABLET (20 MEQ TOTAL) BY MOUTH DAILY.  (Patient taking differently: Take 20 mEq by mouth daily.)  . rosuvastatin (CRESTOR) 10 MG tablet Take 10 mg by mouth daily.  . tadalafil (CIALIS) 5 MG tablet Take 5 mg by mouth daily.  . tamsulosin (FLOMAX) 0.4 MG  CAPS capsule Take 0.4 mg by mouth every evening.  . Zinc 50 MG TABS Take 50 mg by mouth daily.  . [DISCONTINUED] atenolol-chlorthalidone (TENORETIC) 100-25 MG per tablet TAKE 1 TABLET BY MOUTH DAILY. (Patient taking differently: Take 1 tablet by mouth daily.)   No facility-administered medications prior to visit.    Radiology:   No results found.   Cardiac Studies:   Echocardiogram 06/20/2018: 1. The left ventricle has normal systolic function, with an ejection fraction of 55-60%. The cavity size was normal. Left ventricular diastolic Doppler parameters are consistent with impaired relaxation No evidence of left ventricular regional wall  motion abnormalities.  2. The right ventricle has normal systolic function. The cavity was normal. There is no increase in right ventricular wall thickness.  3. Minimal TR doppler jet so unable to estimate PA systolic pressure.  EKG:     EKG 07/22/2020: Normal sinus rhythm at rate of 86 bpm, normal axis, incomplete right bundle branch block.  Frequent PACs.  (3) .  Nonspecific T abnormality.  EKG 07/17/2020: Normal sinus rhythm at rate of 91 beats minute, normal axis, nonspecific T abnormality.  Frequent PACs and PVCs (2).   Assessment     ICD-10-CM   1. Dyspnea on exertion  R06.00 PCV ECHOCARDIOGRAM COMPLETE    PCV MYOCARDIAL PERFUSION WO LEXISCAN  2. PVC (premature  ventricular contraction)  I49.3 PCV ECHOCARDIOGRAM COMPLETE    PCV MYOCARDIAL PERFUSION WO LEXISCAN  3. Essential hypertension  I10 EKG 12-Lead    losartan (COZAAR) 50 MG tablet    Basic metabolic panel    Basic metabolic panel  4. Type 2 diabetes mellitus without complication, with long-term current use of insulin (HCC)  E11.9    Z79.4   5. S/P AAA repair: Endovascular repair of abdominal aortic aneurysm using bifurcated stent graft (aortobiiliac endograft) on 07/12/2020.  Q59.563    Z86.79      Medications Discontinued During This Encounter  Medication Reason  .  atenolol-chlorthalidone (TENORETIC) 100-25 MG per tablet Error    Meds ordered this encounter  Medications  . losartan (COZAAR) 50 MG tablet    Sig: Take 1 tablet (50 mg total) by mouth every evening.    Dispense:  30 tablet    Refill:  2   Orders Placed This Encounter  Procedures  . Basic metabolic panel    Standing Status:   Future    Number of Occurrences:   1    Standing Expiration Date:   07/22/2021  . PCV MYOCARDIAL PERFUSION WO LEXISCAN    Standing Status:   Future    Standing Expiration Date:   09/21/2020  . EKG 12-Lead  . PCV ECHOCARDIOGRAM COMPLETE    Standing Status:   Future    Standing Expiration Date:   07/22/2021    Recommendations:   Aaron BENEGAS Sr. is a 65 y.o. patient male with hypertension, hyperlipidemia, controlled diabetes mellitus, prior tobacco use disorder quit smoking in 2020, admitted for AAA repair.  Underwent endovascular repair of abdominal aortic aneurysm using bifurcated stent graft (aortobiiliac endograft) on 07/12/2020.  During recovery and following day, he had frequent PVCs noted on the telemetry, because of his multiple cardiovascular factors he was referred by Dr. Rock Nephew problem for further cardiac risk stratification and evaluation.  I reviewed his labs, lipids and excellent control, his cardiovascular risk factors are well managed and since recent AAA repair, he has started aspirin as well. Patient did not know what medications he was taking, I only realized later after I had sent in the losartan prescription that patient was started on ACE inhibitor this morning by Dr. Alyson Ingles, in view of underlying COPD, cough, may be best option is to still proceed with use of losartan for now and also it would be more appropriate in view of vascular disease especially AAA to be on losartan.   In view of above risk factors, vascular disease, dyspnea could be his anginal equivalent hence needs cardiac evaluation including echocardiogram to exclude cor pulmonale and  any wall motion abnormality, nuclear stress test to evaluate for CAD.  I would like to see him back in 6 to 8 weeks for follow-up.  Frequent PACs could be a manifestation of underlying COPD and risk for atrial fibrillation.  PVCs may indicate underlying cardiac ischemia.  However patient does have a history of longstanding palpitations since childhood.  As his blood pressure has been very labile, will start with low-dose but will rapidly increase including titration of his beta-blocker.  Recently and also after his endovascular repair of AAA, blood pressure has been labile and low and his diuretics were discontinued.  His blood pressure is now trending up, patient was also previously on atenolol along with hydrochlorothiazide combination, his PCP has sent in for atenolol Rx which advised him to go ahead and start once a day, dose at this point  not known but suspect it is probably 25 mg.  They can certainly contact me if the blood pressure is low, he has signed up for my chart messaging.  This was a 60-minute office visit encounter with review of external records, review of external labs and coordination of care.  Since his AAA repair, his bilateral groins have healed well and there is very minimal ecchymosis on his right groin.  No signs of any pseudoaneurysm or bleeding complications.   Aaron Prows, MD, [facility] 07/22/2020, 5:53 PM Office: [phone] Pager: [phone]   CC: Fortunato Curling, MD (Vasc Surgery).

## 2020-08-03 ENCOUNTER — Other Ambulatory Visit: Payer: Medicare Other

## 2020-08-09 ENCOUNTER — Ambulatory Visit
Admission: RE | Admit: 2020-08-09 | Discharge: 2020-08-09 | Disposition: A | Discharge status: Home / Self Care | Payer: Medicare Other | Source: Ambulatory Visit | Attending: Vascular Surgery | Admitting: Vascular Surgery

## 2020-08-09 ENCOUNTER — Other Ambulatory Visit: Payer: Self-pay

## 2020-08-09 ENCOUNTER — Other Ambulatory Visit: Payer: Medicare Other

## 2020-08-09 DIAGNOSIS — I712 Thoracic aortic aneurysm, without rupture: Secondary | ICD-10-CM | POA: Diagnosis not present

## 2020-08-09 DIAGNOSIS — K7689 Other specified diseases of liver: Secondary | ICD-10-CM | POA: Diagnosis not present

## 2020-08-09 DIAGNOSIS — I722 Aneurysm of renal artery: Secondary | ICD-10-CM | POA: Diagnosis not present

## 2020-08-09 DIAGNOSIS — I713 Abdominal aortic aneurysm, ruptured, unspecified: Secondary | ICD-10-CM

## 2020-08-09 DIAGNOSIS — I714 Abdominal aortic aneurysm, without rupture: Secondary | ICD-10-CM | POA: Diagnosis not present

## 2020-08-09 MED ORDER — IOPAMIDOL (ISOVUE-370) INJECTION 76%
75.0000 mL | Freq: Once | INTRAVENOUS | Status: AC | PRN
  Administered 2020-08-09: 75 mL via INTRAVENOUS

## 2020-08-10 ENCOUNTER — Encounter: Payer: Self-pay | Admitting: Vascular Surgery

## 2020-08-10 ENCOUNTER — Ambulatory Visit (INDEPENDENT_AMBULATORY_CARE_PROVIDER_SITE_OTHER): Payer: Medicare Other | Admitting: Vascular Surgery

## 2020-08-10 VITALS — BP 115/67 | HR 64 | Temp 97.2°F | Resp 18 | Ht 72.0 in | Wt 184.0 lb

## 2020-08-10 DIAGNOSIS — I714 Abdominal aortic aneurysm, without rupture, unspecified: Secondary | ICD-10-CM

## 2020-08-10 NOTE — Progress Notes (Signed)
Patient name: Aaron BURDELL Sr. MRN: [id number] DOB: 1956/03/29 Sex: male  REASON FOR VISIT: Post-op check after AAA stent graft repair  HPI: Aaron STEFANSKI Sr. is a 65 y.o. male multiple medical issues that presents for postop check after EVAR.  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  Postop course was complicated by re-admission for deydration with vague leg symptoms that ultimately resolved.  States today he feels great.  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  No concerns.  Groins have healed up.  No more leg pain.  Past Medical History:  Diagnosis Date  . AAA (abdominal aortic aneurysm) ([place])   . Acute sinusitis, unspecified   . Allergic rhinitis   . Allergy   . Anxiety state, unspecified   . COPD (chronic obstructive pulmonary disease) ([place])   . Corns and callosities    toe  . Cough   . Cramp of limb    legs  . Depression   . Dysrhythmia   . Impacted cerumen   . Impotence of organic origin   . Lumbago   . Other and unspecified hyperlipidemia   . Routine general medical examination at a health care facility   . Tobacco use disorder   . Type II or unspecified type diabetes mellitus without mention of complication, not stated as uncontrolled    type II  . Unspecified essential hypertension     Past Surgical History:  Procedure Laterality Date  . ABDOMINAL AORTIC ENDOVASCULAR STENT GRAFT Bilateral 07/12/2020   Procedure: ABDOMINAL AORTIC ENDOVASCULAR STENT GRAFT;  Surgeon: [name], MD;  Location: [place];  Service: Vascular;  Laterality: Bilateral;  . BACK SURGERY    . CARPECTOMY Right 09/11/2019   Procedure: PROXIMAL ROW CARPECTOMY; RADIAL STYLOIDECTOMY; POSTERIOR INTEROSSIUS NERVE RESECTION;  Surgeon: Daryll Brod, MD;  Location: [place];  Service: Orthopedics;  Laterality: Right;  AXILLARY BLOCK  . COLONOSCOPY    . INGUINAL HERNIA REPAIR     right  . POLYPECTOMY     . TONSILLECTOMY      Family History  Problem Relation Age of Onset  . Cancer Mother        colon  . Colon cancer Mother 6  . Hypertension Father   . Coronary artery disease Other        male<60 and male<50  1st degree relative  . Colon polyps Brother   . Esophageal cancer Neg Hx   . Rectal cancer Neg Hx   . Stomach cancer Neg Hx     SOCIAL HISTORY: Social History   Tobacco Use  . Smoking status: Former Smoker    Packs/day: 0.50    Years: 0.00    Pack years: 0.00    Types: Cigarettes    Quit date: 07/22/2017    Years since quitting: 3.0  . Smokeless tobacco: Never Used  . Tobacco comment: started at age 21.  Substance Use Topics  . Alcohol use: No    Allergies  Allergen Reactions  . Fexofenadine Other (See Comments)    "dries me out too much"  . Hydrochlorothiazide Other (See Comments)    REACTION: cramps  . Loratadine Other (See Comments)    sleepy  . Penicillin V Other (See Comments)  . Penicillins Hives  . Pravastatin Sodium Other (See Comments)    REACTION: aches  . Pravastatin Sodium Other (See Comments)    REACTION: aches  . Requip [  Ropinirole Hcl] Other (See Comments)    Bad dreams  . Sudafed [Pseudoephedrine] Other (See Comments)    dry    Current Outpatient Medications  Medication Sig Dispense Refill  . acetaminophen (TYLENOL) 325 MG tablet Take 650 mg by mouth as needed for mild pain, moderate pain or headache.    . alendronate (FOSAMAX) 70 MG tablet Take 70 mg by mouth every Sunday.    Marland Kitchen aspirin EC 81 MG EC tablet Take 1 tablet (81 mg total) by mouth daily at 6 (six) AM. Swallow whole. 30 tablet 11  . calcium carbonate (OSCAL) 1500 (600 Ca) MG TABS tablet Take 600 mg of elemental calcium by mouth every evening.    . DULoxetine (CYMBALTA) 60 MG capsule Take 60 mg by mouth at bedtime.    . Fish Oil OIL Take 1,500 mg by mouth daily.    Marland Kitchen glimepiride (AMARYL) 2 MG tablet Take 1 tablet (2 mg total) by mouth daily before breakfast. (Patient taking  differently: Take 2 mg by mouth in the morning and at bedtime.) 90 tablet 3  . JANUMET XR 50-1000 MG TB24 Take 2 tablets by mouth at bedtime. 30 tablet   . losartan (COZAAR) 50 MG tablet Take 1 tablet (50 mg total) by mouth every evening. 30 tablet 2  . omeprazole (PRILOSEC) 40 MG capsule Take 40 mg by mouth daily.    . potassium chloride SA (KLOR-CON M20) 20 MEQ tablet TAKE 1 TABLET (20 MEQ TOTAL) BY MOUTH DAILY.  (Patient taking differently: Take 20 mEq by mouth daily.) 30 tablet 5  . rosuvastatin (CRESTOR) 10 MG tablet Take 10 mg by mouth daily.    . tadalafil (CIALIS) 5 MG tablet Take 5 mg by mouth daily.    . tamsulosin (FLOMAX) 0.4 MG CAPS capsule Take 0.4 mg by mouth every evening.    . Zinc 50 MG TABS Take 50 mg by mouth daily.     No current facility-administered medications for this visit.    REVIEW OF SYSTEMS:  [X]  denotes positive finding, [ ]  denotes negative finding Cardiac  Comments:  Chest pain or chest pressure:    Shortness of breath upon exertion:    Short of breath when lying flat:    Irregular heart rhythm:        Vascular    Pain in calf, thigh, or hip brought on by ambulation:    Pain in feet at night that wakes you up from your sleep:     Blood clot in your veins:    Leg swelling:         Pulmonary    Oxygen at home:    Productive cough:     Wheezing:         Neurologic    Sudden weakness in arms or legs:     Sudden numbness in arms or legs:     Sudden onset of difficulty speaking or slurred speech:    Temporary loss of vision in one eye:     Problems with dizziness:         Gastrointestinal    Blood in stool:     Vomited blood:         Genitourinary    Burning when urinating:     Blood in urine:        Psychiatric    Major depression:         Hematologic    Bleeding problems:    Problems with blood clotting too easily:  Skin    Rashes or ulcers:        Constitutional    Fever or chills:      PHYSICAL EXAM: Vitals:    08/10/20 1547  BP: 115/67  Pulse: 64  Resp: 18  Temp: (!) 97.2 F (36.2 C)  TempSrc: Temporal  SpO2: 95%  Weight: 184 lb (83.5 kg)  Height: 6' (1.829 m)    GENERAL: The patient is a well-nourished male, in no acute distress. The vital signs are documented above. CARDIAC: There is a regular rate and rhythm.  VASCULAR:  Palpable femoral pulses bilateral Palpable PT pulses bilaterally PULMONARY: No respiratory distress. ABDOMEN: Soft and non-tender.   DATA:   CTA on my review shows excellent position of the bifurcated stent graft in the abdominal aorta with exclusion of his aneurysm.  I do appreciate some stranding adjacent to the left kidney that could be consistent with a perinephric hemorrhage.  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  I reviewed his CT scan and the stent graft is in excellent position with exclusion of the aneurysm.  Overall he looks good with palpable pedal pulses.  I discussed that my next follow-up would normally be at 9 months but given this question of pathology adjacent to the left kidney we  agreed to see him again in 6 months with CT and then assuming that has resolved we can just follow him on a yearly basis with US duplex here in our office.   [name], MD [facility] [place] Office: [phone]

## 2020-08-16 ENCOUNTER — Other Ambulatory Visit: Payer: Medicare Other

## 2020-08-31 NOTE — Telephone Encounter (Signed)
Patient seen in office

## 2020-09-02 ENCOUNTER — Ambulatory Visit: Payer: Medicare Other | Admitting: Cardiology

## 2020-09-06 ENCOUNTER — Encounter: Payer: Self-pay | Admitting: Gastroenterology

## 2020-09-20 DIAGNOSIS — M8580 Other specified disorders of bone density and structure, unspecified site: Secondary | ICD-10-CM | POA: Diagnosis not present

## 2020-09-20 DIAGNOSIS — E119 Type 2 diabetes mellitus without complications: Secondary | ICD-10-CM | POA: Diagnosis not present

## 2020-09-20 DIAGNOSIS — E1169 Type 2 diabetes mellitus with other specified complication: Secondary | ICD-10-CM | POA: Diagnosis not present

## 2020-09-20 DIAGNOSIS — E1165 Type 2 diabetes mellitus with hyperglycemia: Secondary | ICD-10-CM | POA: Diagnosis not present

## 2020-09-20 DIAGNOSIS — I10 Essential (primary) hypertension: Secondary | ICD-10-CM | POA: Diagnosis not present

## 2020-09-20 DIAGNOSIS — J439 Emphysema, unspecified: Secondary | ICD-10-CM | POA: Diagnosis not present

## 2020-09-20 DIAGNOSIS — E7800 Pure hypercholesterolemia, unspecified: Secondary | ICD-10-CM | POA: Diagnosis not present

## 2020-09-21 ENCOUNTER — Other Ambulatory Visit: Payer: Self-pay

## 2020-09-21 ENCOUNTER — Ambulatory Visit: Payer: Medicare Other | Admitting: Cardiology

## 2020-09-21 ENCOUNTER — Encounter: Payer: Self-pay | Admitting: Cardiology

## 2020-09-21 VITALS — BP 130/80 | HR 84 | Ht 72.0 in | Wt 189.0 lb

## 2020-09-21 DIAGNOSIS — I493 Ventricular premature depolarization: Secondary | ICD-10-CM | POA: Diagnosis not present

## 2020-09-21 DIAGNOSIS — Z8679 Personal history of other diseases of the circulatory system: Secondary | ICD-10-CM

## 2020-09-21 DIAGNOSIS — R062 Wheezing: Secondary | ICD-10-CM

## 2020-09-21 DIAGNOSIS — Z9889 Other specified postprocedural states: Secondary | ICD-10-CM

## 2020-09-21 DIAGNOSIS — I714 Abdominal aortic aneurysm, without rupture, unspecified: Secondary | ICD-10-CM

## 2020-09-21 DIAGNOSIS — R0600 Dyspnea, unspecified: Secondary | ICD-10-CM

## 2020-09-21 DIAGNOSIS — R0609 Other forms of dyspnea: Secondary | ICD-10-CM

## 2020-09-21 NOTE — Patient Instructions (Signed)
Medication Instructions:  The current medical regimen is effective;  continue present plan and medications.  *If you need a refill on your cardiac medications before your next appointment, please call your pharmacy*  You have been referred to pulmonary for the evaluation and treatment of COPD.  Follow-Up: At [facility], you and your health needs are our priority.  As part of our continuing mission to provide you with exceptional heart care, we have created designated Provider Care Teams.  These Care Teams include your primary Cardiologist (physician) and Advanced Practice Providers (APPs -  Physician Assistants and Nurse Practitioners) who all work together to provide you with the care you need, when you need it.  We recommend signing up for the patient portal called "MyChart".  Sign up information is provided on this After Visit Summary.  MyChart is used to connect with patients for Virtual Visits (Telemedicine).  Patients are able to view lab/test results, encounter notes, upcoming appointments, etc.  Non-urgent messages can be sent to your provider as well.   To learn more about what you can do with MyChart, go to [email or web address].    Your next appointment:   1 year(s)  The format for your next appointment:   In Person  Provider:   Candee Furbish, MD  Thank you for choosing [facility]!!

## 2020-09-21 NOTE — Progress Notes (Signed)
Cardiology Office Note:    Date:  09/21/2020   ID:  Aaron Weaver Sr., DOB [birth year], MRN [id number]  PCP:  Aaron Dus, MD   [facility] Providers Cardiologist:  Aaron Furbish, MD     Referring MD: Aaron Dus, MD    History of Present Illness:    Aaron PARKERSON Sr. is a 65 y.o. male here for the evaluation of prior AAA, tobacco use, diabetes, hypertension, COPD, former patient of Aaron Weaver.  I take care of his wife Aaron Weaver.  He requested visit here.  Underwent endovascular repair of abdominal aortic aneurysm using bifurcated stent graft (aortobiiliac endograft) on 07/12/2020.  During recovery and following day, he had frequent PVCs noted on the telemetry.  He is feeling great.  Mild shortness of breath with activity, wheeze.  Quit smoking 2 to 3 years ago.  No early family history of CAD.  He is tolerating his atenolol that he has been taking for several years.  He did not tolerate losartan 50 mg.  He has stopped this.  Blood pressure under adequate control.  Denies any fevers chills nausea vomiting syncope bleeding.  Past Medical History:  Diagnosis Date  . AAA (abdominal aortic aneurysm) ([place])   . Acute sinusitis, unspecified   . Allergic rhinitis   . Allergy   . Anxiety state, unspecified   . COPD (chronic obstructive pulmonary disease) ([place])   . Corns and callosities    toe  . Cough   . Cramp of limb    legs  . Depression   . Dysrhythmia   . Impacted cerumen   . Impotence of organic origin   . Lumbago   . Other and unspecified hyperlipidemia   . Routine general medical examination at a health care facility   . Tobacco use disorder   . Type II or unspecified type diabetes mellitus without mention of complication, not stated as uncontrolled    type II  . Unspecified essential hypertension     Past Surgical History:  Procedure Laterality Date  . ABDOMINAL AORTIC ENDOVASCULAR STENT GRAFT Bilateral 07/12/2020   Procedure: ABDOMINAL AORTIC ENDOVASCULAR  STENT GRAFT;  Surgeon: Aaron Heck, MD;  Location: [place];  Service: Vascular;  Laterality: Bilateral;  . BACK SURGERY    . CARPECTOMY Right 09/11/2019   Procedure: PROXIMAL ROW CARPECTOMY; RADIAL STYLOIDECTOMY; POSTERIOR INTEROSSIUS NERVE RESECTION;  Surgeon: Daryll Brod, MD;  Location: [place];  Service: Orthopedics;  Laterality: Right;  AXILLARY BLOCK  . COLONOSCOPY    . INGUINAL HERNIA REPAIR     right  . POLYPECTOMY    . TONSILLECTOMY      Current Medications: Current Meds  Medication Sig  . acetaminophen (TYLENOL) 325 MG tablet Take 650 mg by mouth as needed for mild pain, moderate pain or headache.  . alendronate (FOSAMAX) 70 MG tablet Take 70 mg by mouth every Sunday.  Marland Kitchen aspirin EC 81 MG EC tablet Take 1 tablet (81 mg total) by mouth daily at 6 (six) AM. Swallow whole.  Marland Kitchen atenolol (TENORMIN) 100 MG tablet Take 100 mg by mouth daily.  . calcium carbonate (OSCAL) 1500 (600 Ca) MG TABS tablet Take 600 mg of elemental calcium by mouth every evening.  . DULoxetine (CYMBALTA) 60 MG capsule Take 60 mg by mouth at bedtime.  . Fish Oil OIL Take 1,500 mg by mouth daily.  Marland Kitchen glimepiride (AMARYL) 2 MG tablet Take 1 tablet (2 mg total) by mouth daily before breakfast.  . JANUMET  XR 50-1000 MG TB24 Take 2 tablets by mouth at bedtime.  Marland Kitchen losartan (COZAAR) 50 MG tablet Take 1 tablet (50 mg total) by mouth every evening.  Marland Kitchen omeprazole (PRILOSEC) 40 MG capsule Take 40 mg by mouth daily.  . potassium chloride SA (KLOR-CON M20) 20 MEQ tablet TAKE 1 TABLET (20 MEQ TOTAL) BY MOUTH DAILY.   . rosuvastatin (CRESTOR) 10 MG tablet Take 10 mg by mouth daily.  . tadalafil (CIALIS) 5 MG tablet Take 5 mg by mouth daily.  . tamsulosin (FLOMAX) 0.4 MG CAPS capsule Take 0.4 mg by mouth every evening.  . Zinc 50 MG TABS Take 50 mg by mouth daily.     Allergies:   Fexofenadine, Hydrochlorothiazide, Loratadine, Penicillin v, Penicillins, Pravastatin sodium, Pravastatin sodium, Requip  [ropinirole hcl], and Sudafed [pseudoephedrine]   Social History   Socioeconomic History  . Marital status: Married    Spouse name: Aaron Weaver  . Number of children: 1  . Years of education: Not on file  . Highest education level: Not on file  Occupational History  . 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 Smoking status: Former Smoker    Packs/day: 0.50    Years: 0.00    Pack years: 0.00    Types: Cigarettes    Quit date: 07/22/2017    Years since quitting: 3.1  . Smokeless tobacco: Never Used  . Tobacco comment: started at age 5.  Vaping Use  . Vaping Use: Never used  Substance and Sexual Activity  . Alcohol use: No  . Drug use: No  . Sexual activity: Yes  Other Topics Concern  . Not on file  Social History Narrative  . Not on file   Social Determinants of Health   Financial Resource Strain: Not on file  Food Insecurity: Not on file  Transportation Needs: Not on file  Physical Activity: Not on file  Stress: Not on file  Social Connections: Not on file     Family History: The patient's family history includes Colon cancer (age of onset: 38) in his mother; Colon polyps in his brother; Coronary artery disease in an other family member; Hypertension in his father. There is no history of Esophageal cancer, Rectal cancer, or Stomach cancer.  ROS:   Please see the history of present illness.     All other systems reviewed and are negative.  EKGs/Labs/Other Studies Reviewed:    The following studies were reviewed today:   Echocardiogram 06/20/2018: 1. The left ventricle has normal systolic function, with an ejection fraction of 55-60%. The cavity size was normal. Left ventricular diastolic Doppler parameters are consistent with impaired relaxation No evidence of left ventricular regional wall  motion abnormalities.  2. The right ventricle has normal systolic function. The cavity was normal. There is no increase in right ventricular wall thickness.  3. Minimal TR  doppler jet so unable to estimate PA systolic pressure.  EKG: As described  Recent Labs: 07/07/2020: ALT 31 07/17/2020: BUN 24; Creatinine, Ser 1.13; Hemoglobin 11.1; Platelets 139; Potassium 3.3; Sodium 132  Recent Lipid Panel    Component Value Date/Time   CHOL 117 11/23/2011 0756   TRIG 85.0 11/23/2011 0756   HDL 35.10 (L) 11/23/2011 0756   CHOLHDL 3 11/23/2011 0756   VLDL 17.0 11/23/2011 0756   LDLCALC 65 11/23/2011 0756   LDLDIRECT 73.2 07/19/2010 0757     Risk Assessment/Calculations:      Physical Exam:    VS:  BP 130/80 (BP Location: Left Arm, Patient  Position: Sitting, Cuff Size: Normal)   Pulse 84   Ht 6' (1.829 m)   Wt 189 lb (85.7 kg)   SpO2 90%   BMI 25.63 kg/m     Wt Readings from Last 3 Encounters:  09/21/20 189 lb (85.7 kg)  08/10/20 184 lb (83.5 kg)  07/22/20 190 lb 12.8 oz (86.5 kg)     GEN:  Well nourished, well developed in no acute distress HEENT: Normal NECK: No JVD; No carotid bruits LYMPHATICS: No lymphadenopathy CARDIAC: RRR, no murmurs, rubs, gallops RESPIRATORY: Wheezing heard bilaterally ABDOMEN: Soft, non-tender, non-distended MUSCULOSKELETAL:  No edema; No deformity  SKIN: Warm and dry NEUROLOGIC:  Alert and oriented x 3 PSYCHIATRIC:  Normal affect   ASSESSMENT:    1. Wheezing   2. Dyspnea on exertion   3. PVC (premature ventricular contraction)   4. AAA (abdominal aortic aneurysm) without rupture ([place])   5. S/P AAA repair: Endovascular repair of abdominal aortic aneurysm using bifurcated stent graft (aortobiiliac endograft) on 07/12/2020.    PLAN:    In order of problems listed above:  AAA repair endovascular Dr. Trula Slade - Currently on aspirin, statin with good control.  On atenolol longstanding.  Blood pressure adequate.  Excellent pedal pulses.  COPD - Quit smoking 2 to 3 years ago.  Excellent.  I am hearing some wheezing on exam.  I would like for him to get an evaluation by pulmonary clinic.  Pulmonary nodules have been  followed.  In fact this is how they discovered his AAA.  Frequent PVCs PACs - Longstanding palpitations since childhood.  EKG personally reviewed and interpreted from 07/22/2020 shows PACs, sinus rhythm heart rate in the 80s. Never felt them his whole life. Wore a monitor in his 20's.  - Has been on atenolol 100 mg a day as beta-blocker to help suppress. - LDL 35 ALT 31 creatinine 1.1 hemoglobin 11.1 potassium 3.3.  Essential hypertension - Currently taking atenolol 100 mg a day.  It looks like his losartan was discontinued.  He states that he has not been able to take this medication in the past.  Hypokalemia - Has been taking 20 mill equivalents of potassium daily.  Hyperlipidemia - On Crestor 10 mg a day LDL 35.  Excellent.  1 year follow-up.     Medication Adjustments/Labs and Tests Ordered: Current medicines are reviewed at length with the patient today.  Concerns regarding medicines are outlined above.  Orders Placed This Encounter  Procedures  . Ambulatory referral to Pulmonology   No orders of the defined types were placed in this encounter.   Patient Instructions  Medication Instructions:  The current medical regimen is effective;  continue present plan and medications.  *If you need a refill on your cardiac medications before your next appointment, please call your pharmacy*  You have been referred to pulmonary for the evaluation and treatment of COPD.  Follow-Up: At [facility], you and your health needs are our priority.  As part of our continuing mission to provide you with exceptional heart care, we have created designated Provider Care Teams.  These Care Teams include your primary Cardiologist (physician) and Advanced Practice Providers (APPs -  Physician Assistants and Nurse Practitioners) who all work together to provide you with the care you need, when you need it.  We recommend signing up for the patient portal called "MyChart".  Sign up information is  provided on this After Visit Summary.  MyChart is used to connect with patients for Virtual Visits (Telemedicine).  Patients are able to view lab/test results, encounter notes, upcoming appointments, etc.  Non-urgent messages can be sent to your provider as well.   To learn more about what you can do with MyChart, go to [email or web address].    Your next appointment:   1 year(s)  The format for your next appointment:   In Person  Provider:   Candee Furbish, MD  Thank you for choosing [facility]!!         Signed, Aaron Furbish, MD  09/21/2020 10:41 AM    [place]

## 2020-10-14 ENCOUNTER — Other Ambulatory Visit: Payer: Self-pay

## 2020-10-14 ENCOUNTER — Encounter: Payer: Self-pay | Admitting: Emergency Medicine

## 2020-10-14 ENCOUNTER — Ambulatory Visit: Payer: Medicare Other | Admitting: Emergency Medicine

## 2020-10-14 ENCOUNTER — Telehealth: Payer: Self-pay | Admitting: Emergency Medicine

## 2020-10-14 DIAGNOSIS — R911 Solitary pulmonary nodule: Secondary | ICD-10-CM

## 2020-10-14 DIAGNOSIS — J449 Chronic obstructive pulmonary disease, unspecified: Secondary | ICD-10-CM

## 2020-10-14 MED ORDER — ALBUTEROL SULFATE HFA 108 (90 BASE) MCG/ACT IN AERS: 1.0000 | INHALATION_SPRAY | Freq: Four times a day (QID) | RESPIRATORY_TRACT | 6 refills | Status: DC | PRN

## 2020-10-14 MED ORDER — ALBUTEROL SULFATE HFA 108 (90 BASE) MCG/ACT IN AERS: 2.0000 | INHALATION_SPRAY | Freq: Four times a day (QID) | RESPIRATORY_TRACT | 6 refills | Status: DC | PRN

## 2020-10-14 NOTE — Progress Notes (Signed)
Subjective:    Patient ID: Aaron Fee Sr., male    DOB: 08-29-1955, 65 y.o.   MRN: [id number]  HPI  65 year old former smoker (60+ pack years) with a history of diabetes, hypertension, GERD, allergic rhinitis, AAA for which he underwent stent graft repair.  He is referred today for evaluation of COPD. He apparently had a pulmonary nodule defined on CT chest 08/2018 was followed with serial CT scans and deemed stable over the interval.   He reports exertional dyspnea, can happen with working outside, usually yard work. Can happen sometimes happen when he walks uphill. His wife has heard some wheeze. He coughs, every day. No mucous production. He paces himself with shopping, etc. He used to get freq bronchitis, but has improved over the last several years.   Currently managed on omeprazole 40 mg daily.  No bronchodilators or allergy medications.  CT angiography of the abdomen 08/09/2020 reviewed, lung windows showed mild centrilobular pulmonary emphysematous changes without any suspicious mass or nodules.  CT chest 10/14/2019 reviewed by me, showed some prominent right axillary nodes of unclear significance, no other adenopathy, mild centrilobular and paraseptal emphysematous changes.  Small bulla noted in the medial right upper lobe.  A previously noted 1.0 x 0.7 cm right middle lobe groundglass nodule was stable going back 2 years.  Review of Systems As per HPI  Past Medical History:  Diagnosis Date   AAA (abdominal aortic aneurysm) ([place])    Acute sinusitis, unspecified    Allergic rhinitis    Allergy    Anxiety state, unspecified    COPD (chronic obstructive pulmonary disease) (H[facility])    Corns and callosities    toe   Cough    Cramp of limb    legs   Depression    Dysrhythmia    Impacted cerumen    Impotence of organic origin    Lumbago    Other and unspecified hyperlipidemia    Routine general medical examination at a health care facility    Tobacco use disorder    Type II  or unspecified type diabetes mellitus without mention of complication, not stated as uncontrolled    type II   Unspecified essential hypertension      Family History  Problem Relation Age of Onset   Colon cancer Mother 32   Hypertension Father    Coronary artery disease Other        male<60 and male<50  1st degree relative   Colon polyps Brother    Esophageal cancer Neg Hx    Rectal cancer Neg Hx    Stomach cancer Neg Hx      Social History   Socioeconomic History   Marital status: Married    Spouse name: Grant Swager   Number of children: 1   Years of education: Not on file   Highest education level: Not on file  Occupational History   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  No mold or water damage Has done body work for RV's, no asbestos, lots of dust and paint fumes No military [place] native   Allergies  Allergen Reactions   Fexofenadine Other (See Comments)    "dries me out too much"   Hydrochlorothiazide Other (See Comments)    REACTION: cramps   Loratadine Other (See Comments)    sleepy   Penicillin V Other (See Comments)   Penicillins Hives   Pravastatin Sodium Other (See  Comments)    REACTION: aches   Pravastatin Sodium Other (See Comments)    REACTION: aches   Requip [Ropinirole Hcl] Other (See Comments)    Bad dreams   Sudafed [Pseudoephedrine] Other (See Comments)    dry     Outpatient Medications Prior to Visit  Medication Sig Dispense Refill   acetaminophen (TYLENOL) 325 MG tablet Take 650 mg by mouth as needed for mild pain, moderate pain or headache.     alendronate (FOSAMAX) 70 MG tablet Take 70 mg by mouth every Sunday.     aspirin EC 81 MG EC tablet Take 1 tablet (81 mg total) by mouth daily at 6 (six) AM. Swallow whole. 30 tablet 11   atenolol (TENORMIN) 100 MG tablet Take 100 mg by mouth daily.     calcium carbonate (OSCAL) 1500 (600 Ca) MG TABS tablet Take 600 mg of elemental calcium by mouth every evening.     DULoxetine (CYMBALTA) 60 MG capsule Take 60 mg by mouth at bedtime.     Fish Oil OIL Take 1,500 mg by mouth daily.     glimepiride (AMARYL) 2 MG tablet Take 1 tablet (2 mg total) by mouth daily before breakfast. 90 tablet 3   JANUMET XR 50-1000 MG TB24 Take 2 tablets by mouth at bedtime. 30 tablet    losartan (COZAAR) 50 MG tablet Take 1 tablet (50 mg total) by mouth every evening. 30 tablet 2   omeprazole (PRILOSEC) 40 MG capsule Take 40 mg by mouth daily.     potassium chloride SA (KLOR-CON M20) 20 MEQ tablet TAKE 1 TABLET (20 MEQ TOTAL) BY MOUTH DAILY.  30 tablet 5   rosuvastatin (CRESTOR) 10 MG tablet Take 10 mg by mouth daily.     tadalafil (CIALIS) 5 MG tablet Take 5 mg by mouth daily.     tamsulosin (FLOMAX) 0.4 MG CAPS capsule Take 0.4 mg by mouth every evening.     Zinc 50 MG TABS Take 50 mg by mouth daily.     No facility-administered medications prior to visit.       Objective:   Physical Exam Vitals:   10/14/20 0959  BP: 126/74  Pulse: 76  Temp: (!) 97.5 F (36.4 C)  TempSrc: Temporal  SpO2: 97%  Weight: 186 lb 3.2 oz (84.5 kg)  Height: 6' (1.829 m)   Gen: Pleasant, well-nourished, in no distress,  normal  affect  ENT: No lesions,  mouth clear,  oropharynx clear, no postnasal drip  Neck: No JVD, no stridor  Lungs: No use of accessory muscles, no crackles  or wheezing on normal respiration, no wheeze on forced expiration  Cardiovascular: RRR, heart sounds normal, no murmur or gallops, no peripheral edema  Musculoskeletal: No deformities, no cyanosis or clubbing  Neuro: alert, awake, non focal  Skin: Warm, no lesions or rash      Assessment & Plan:   Pulmonary nodule 1 cm or greater in diameter Right middle lobe pulmonary nodule 1.0 cm, groundglass, originally noted 08/2018, stable on most recent scan, stable over 2 years.  Will need to follow-up in 5 years total.  Next scan in April 2023.  COPD (chronic obstructive pulmonary disease) (H[facility]) Based on his smoking history, clinical symptoms I suspect he does have COPD.  He had emphysema on CT chest which supports this.  He needs pulmonary function testing to quantify his degree of obstruction.  In the meantime we will start him on SABA to see if he gets benefit.  Ultimately I believe he will likely benefit from LABA.  We can talk about starting this once we have seen his PFTs.    Baltazar Apo, MD, PhD 10/14/2020, 10:32 AM [facility] [phone] or if no answer before 7:00PM call [phone] For any issues after 7:00PM please call eLink [phone]

## 2020-10-14 NOTE — Assessment & Plan Note (Signed)
Right middle lobe pulmonary nodule 1.0 cm, groundglass, originally noted 08/2018, stable on most recent scan, stable over 2 years.  Will need to follow-up in 5 years total.  Next scan in April 2023.

## 2020-10-14 NOTE — Patient Instructions (Signed)
We will arrange for pulmonary function testing in next office visit. Try using albuterol 2 puffs about 10 minutes before exertion to see if this helps your breathing.  You can also use 2 puffs up to every 4 hours if you experience shortness of breath, chest tightness, wheezing. We will plan to repeat your CT scan of the chest to follow your small pulmonary nodule.  Next scan in April 2023. Follow with Dr. Lamonte Sakai next available with full pulmonary function testing on the same day.

## 2020-10-14 NOTE — Telephone Encounter (Signed)
Called and spoke with patient. He stated that his insurance will not cover the Ventolin but will cover ProAir. I advised him that I would go ahead and send in the ProAir for him. He verbalized understanding.   Nothing further needed at time of call.

## 2020-10-14 NOTE — Assessment & Plan Note (Signed)
Based on his smoking history, clinical symptoms I suspect he does have COPD.  He had emphysema on CT chest which supports this.  He needs pulmonary function testing to quantify his degree of obstruction.  In the meantime we will start him on SABA to see if he gets benefit.  Ultimately I believe he will likely benefit from LABA.  We can talk about starting this once we have seen his PFTs.

## 2020-10-14 NOTE — Addendum Note (Signed)
Addended by: Valerie Salts on: 10/14/2020 10:37 AM   Modules accepted: Orders

## 2020-10-15 ENCOUNTER — Other Ambulatory Visit: Payer: Self-pay | Admitting: *Deleted

## 2020-10-15 MED ORDER — ALBUTEROL SULFATE HFA 108 (90 BASE) MCG/ACT IN AERS: 1.0000 | INHALATION_SPRAY | Freq: Four times a day (QID) | RESPIRATORY_TRACT | 6 refills | Status: AC | PRN

## 2020-11-02 DIAGNOSIS — J439 Emphysema, unspecified: Secondary | ICD-10-CM | POA: Diagnosis not present

## 2020-11-02 DIAGNOSIS — I10 Essential (primary) hypertension: Secondary | ICD-10-CM | POA: Diagnosis not present

## 2020-11-02 DIAGNOSIS — M8580 Other specified disorders of bone density and structure, unspecified site: Secondary | ICD-10-CM | POA: Diagnosis not present

## 2020-11-02 DIAGNOSIS — E1169 Type 2 diabetes mellitus with other specified complication: Secondary | ICD-10-CM | POA: Diagnosis not present

## 2020-11-02 DIAGNOSIS — M545 Low back pain, unspecified: Secondary | ICD-10-CM | POA: Diagnosis not present

## 2020-11-02 DIAGNOSIS — K219 Gastro-esophageal reflux disease without esophagitis: Secondary | ICD-10-CM | POA: Diagnosis not present

## 2020-11-02 DIAGNOSIS — Z1211 Encounter for screening for malignant neoplasm of colon: Secondary | ICD-10-CM | POA: Diagnosis not present

## 2020-11-02 DIAGNOSIS — M8588 Other specified disorders of bone density and structure, other site: Secondary | ICD-10-CM | POA: Diagnosis not present

## 2020-11-02 DIAGNOSIS — E1165 Type 2 diabetes mellitus with hyperglycemia: Secondary | ICD-10-CM | POA: Diagnosis not present

## 2020-11-02 DIAGNOSIS — E7800 Pure hypercholesterolemia, unspecified: Secondary | ICD-10-CM | POA: Diagnosis not present

## 2020-11-02 DIAGNOSIS — N390 Urinary tract infection, site not specified: Secondary | ICD-10-CM | POA: Diagnosis not present

## 2020-11-02 DIAGNOSIS — E119 Type 2 diabetes mellitus without complications: Secondary | ICD-10-CM | POA: Diagnosis not present

## 2020-11-02 DIAGNOSIS — F17201 Nicotine dependence, unspecified, in remission: Secondary | ICD-10-CM | POA: Diagnosis not present

## 2020-11-03 ENCOUNTER — Encounter: Payer: Self-pay | Admitting: Gastroenterology

## 2020-11-11 ENCOUNTER — Other Ambulatory Visit: Payer: Self-pay | Admitting: Family Medicine

## 2020-11-11 DIAGNOSIS — M8580 Other specified disorders of bone density and structure, unspecified site: Secondary | ICD-10-CM

## 2020-11-17 ENCOUNTER — Other Ambulatory Visit: Payer: Self-pay

## 2020-11-17 ENCOUNTER — Ambulatory Visit (AMBULATORY_SURGERY_CENTER): Payer: Medicare Other | Admitting: *Deleted

## 2020-11-17 VITALS — Ht 72.0 in | Wt 190.0 lb

## 2020-11-17 DIAGNOSIS — Z800 Family history of malignant neoplasm of digestive organs: Secondary | ICD-10-CM

## 2020-11-17 DIAGNOSIS — Z86010 Personal history of colonic polyps: Secondary | ICD-10-CM

## 2020-11-17 MED ORDER — SUPREP BOWEL PREP KIT 17.5-3.13-1.6 GM/177ML PO SOLN: 1.0000 | Freq: Once | ORAL | 0 refills | Status: AC

## 2020-11-17 NOTE — Progress Notes (Signed)
No egg or soy allergy known to patient  No issues with past sedation with any surgeries or procedures Patient denies ever being told they had issues or difficulty with intubation  No FH of Malignant Hyperthermia No diet pills per patient No home 02 use per patient  No blood thinners per patient  Pt denies issues with constipation  No A fib or A flutter  EMMI video to pt or via [place] 19 guidelines implemented in [place] today with Pt and RN  Pt is not vaccinated  for Covid   Due to the COVID-19 pandemic we are asking patients to follow certain guidelines.  Pt aware of COVID protocols and [facility] guidelines   Pt verified name, DOB, address and insurance during PV today. Pt mailed instruction packet to included paper to complete and mail back to [facility] with addressed and stamped envelope, Emmi video, copy of consent form to read and not return, and instructions. PV completed over the phone. Pt encouraged to call with questions or issues.  My Chart instructions to pt as well

## 2020-11-23 ENCOUNTER — Other Ambulatory Visit (HOSPITAL_COMMUNITY)
Admission: RE | Admit: 2020-11-23 | Discharge: 2020-11-23 | Disposition: A | Discharge status: Home / Self Care | Payer: Medicare Other | Source: Ambulatory Visit | Attending: Emergency Medicine | Admitting: Emergency Medicine

## 2020-11-23 DIAGNOSIS — Z01812 Encounter for preprocedural laboratory examination: Secondary | ICD-10-CM | POA: Diagnosis not present

## 2020-11-23 DIAGNOSIS — Z20822 Contact with and (suspected) exposure to covid-19: Secondary | ICD-10-CM | POA: Diagnosis not present

## 2020-11-23 LAB — SARS CORONAVIRUS 2 (TAT 6-24 HRS): SARS Coronavirus 2: NEGATIVE

## 2020-11-26 ENCOUNTER — Other Ambulatory Visit: Payer: Self-pay

## 2020-11-26 ENCOUNTER — Encounter: Payer: Self-pay | Admitting: Emergency Medicine

## 2020-11-26 ENCOUNTER — Ambulatory Visit: Payer: Medicare Other | Admitting: Emergency Medicine

## 2020-11-26 ENCOUNTER — Ambulatory Visit (INDEPENDENT_AMBULATORY_CARE_PROVIDER_SITE_OTHER): Payer: Medicare Other | Admitting: Emergency Medicine

## 2020-11-26 DIAGNOSIS — J449 Chronic obstructive pulmonary disease, unspecified: Secondary | ICD-10-CM

## 2020-11-26 DIAGNOSIS — R911 Solitary pulmonary nodule: Secondary | ICD-10-CM | POA: Diagnosis not present

## 2020-11-26 LAB — PULMONARY FUNCTION TEST
DL/VA % pred: 85 %
DL/VA: 3.51 ml/min/mmHg/L
DLCO cor % pred: 65 %
DLCO cor: 18.65 ml/min/mmHg
DLCO unc % pred: 65 %
DLCO unc: 18.65 ml/min/mmHg
FEF 25-75 Post: 1.66 L/sec
FEF 25-75 Pre: 1.56 L/sec
FEF2575-%Change-Post: 5 %
FEF2575-%Pred-Post: 57 %
FEF2575-%Pred-Pre: 54 %
FEV1-%Change-Post: 0 %
FEV1-%Pred-Post: 64 %
FEV1-%Pred-Pre: 64 %
FEV1-Post: 2.36 L
FEV1-Pre: 2.38 L
FEV1FVC-%Change-Post: -2 %
FEV1FVC-%Pred-Pre: 95 %
FEV6-%Change-Post: 2 %
FEV6-%Pred-Post: 73 %
FEV6-%Pred-Pre: 71 %
FEV6-Post: 3.42 L
FEV6-Pre: 3.34 L
FEV6FVC-%Pred-Post: 105 %
FEV6FVC-%Pred-Pre: 105 %
FVC-%Change-Post: 1 %
FVC-%Pred-Post: 69 %
FVC-%Pred-Pre: 68 %
FVC-Post: 3.42 L
FVC-Pre: 3.36 L
Post FEV1/FVC ratio: 69 %
Post FEV6/FVC ratio: 100 %
Pre FEV1/FVC ratio: 71 %
Pre FEV6/FVC Ratio: 100 %
RV % pred: 97 %
RV: 2.42 L
TLC % pred: 77 %
TLC: 5.78 L

## 2020-11-26 MED ORDER — SPIRIVA RESPIMAT 2.5 MCG/ACT IN AERS: 2.0000 | INHALATION_SPRAY | Freq: Every day | RESPIRATORY_TRACT | 0 refills | Status: DC

## 2020-11-26 NOTE — Patient Instructions (Addendum)
We will start Spiriva Respimat to puffs once daily.  Take this every day as a maintenance inhaler.  Please let us know if you have any side effects or difficulty taking the medication.  If so we will consider an alternative Keep albuterol available to use 2 puffs up to every 4 hours when needed for shortness of breath, chest tightness, wheezing. We reviewed your pulmonary function testing today We will plan to repeat your CT scan of the chest in April 2023 to compare with your priors. You would probably benefit from having the COVID vaccine if you are comfortable getting it.  Follow with Dr Lamonte Sakai in April 2023 or sooner if you have any problems.

## 2020-11-26 NOTE — Assessment & Plan Note (Signed)
Obstruction confirmed on his pulmonary function testing today.  He did benefit from albuterol trial.  He is willing to start scheduled bronchodilator therapy.  I discussed with him today the benefits of COVID-19 vaccine.  He is not sure he wants to get it.  We will start Spiriva Respimat to puffs once daily.  Take this every day as a maintenance inhaler.  Please let us know if you have any side effects or difficulty taking the medication.  If so we will consider an alternative Keep albuterol available to use 2 puffs up to every 4 hours when needed for shortness of breath, chest tightness, wheezing. We reviewed your pulmonary function testing today You would probably benefit from having the COVID vaccine if you are comfortable getting it.  Follow with Dr Lamonte Sakai in April 2023 or sooner if you have any problems.

## 2020-11-26 NOTE — Progress Notes (Signed)
Full PFT performed today. °

## 2020-11-26 NOTE — Progress Notes (Signed)
Subjective:    Patient ID: Aaron Fee Sr., male    DOB: 1955/06/03, 65 y.o.   MRN: [id number]  HPI  65 year old former smoker (60+ pack years) with a history of diabetes, hypertension, GERD, allergic rhinitis, AAA for which he underwent stent graft repair.  He is referred today for evaluation of COPD. He apparently had a pulmonary nodule defined on CT chest 08/2018 was followed with serial CT scans and deemed stable over the interval.   He reports exertional dyspnea, can happen with working outside, usually yard work. Can happen sometimes happen when he walks uphill. His wife has heard some wheeze. He coughs, every day. No mucous production. He paces himself with shopping, etc. He used to get freq bronchitis, but has improved over the last several years.   Currently managed on omeprazole 40 mg daily.  No bronchodilators or allergy medications.  CT angiography of the abdomen 08/09/2020 reviewed, lung windows showed mild centrilobular pulmonary emphysematous changes without any suspicious mass or nodules.  CT chest 10/14/2019 reviewed by me, showed some prominent right axillary nodes of unclear significance, no other adenopathy, mild centrilobular and paraseptal emphysematous changes.  Small bulla noted in the medial right upper lobe.  A previously noted 1.0 x 0.7 cm right middle lobe groundglass nodule was stable going back 2 years.  ROV 11/26/20 --Aaron Weaver is 53 with history tobacco use.  Met him in June for evaluation of COPD as well as a right middle lobe groundglass pulmonary nodule.  That has been stable for 2 years. I tried him on albuterol to see if he would get benefit.  He reports that the albuterol helped him significantly. 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    Pulmonary function testing performed today reviewed by me, shows mixed obstruction and restriction on spirometry without a bronchodilator response, FEV1 64% predicted, 2.38 L.  Lung volumes  confirm mild restriction.  Diffusion capacity decreased and corrects to the normal range when adjusted for alveolar volume.   Review of Systems As per HPI      Objective:   Physical Exam Vitals:   11/26/20 1155  BP: 104/68  Pulse: 67  Temp: (!) 97.2 F (36.2 C)  TempSrc: Oral  SpO2: 95%  Weight: 191 lb (86.6 kg)  Height: 6' (1.829 m)    Gen: Pleasant, well-nourished, in no distress,  normal affect  ENT: No lesions,  mouth clear,  oropharynx clear, no postnasal drip  Neck: No JVD, no stridor  Lungs: No use of accessory muscles, no crackles or wheezing on normal respiration, no wheeze on forced expiration  Cardiovascular: RRR, heart sounds normal, no murmur or gallops, no peripheral edema  Musculoskeletal: No deformities, no cyanosis or clubbing  Neuro: alert, awake, non focal  Skin: Warm, no lesions or rash      Assessment & Plan:   COPD (chronic obstructive pulmonary disease) (HCC) Obstruction confirmed on his pulmonary function testing today.  He did benefit from albuterol trial.  He is willing to start scheduled bronchodilator therapy.  I discussed with him today the benefits of COVID-19 vaccine.  He is not sure he wants to get it.  We will start Spiriva Respimat to puffs once daily.  Take this every day as a maintenance inhaler.  Please let us know if you have any side effects or difficulty taking the medication.  If so we will consider an alternative Keep albuterol available to use 2 puffs up to every 4 hours  when needed for shortness of breath, chest tightness, wheezing. We reviewed your pulmonary function testing today You would probably benefit from having the COVID vaccine if you are comfortable getting it.  Follow with Dr Lamonte Sakai in April 2023 or sooner if you have any problems.  Pulmonary nodule 1 cm or greater in diameter Following groundglass nodule in the right middle lobe.  Has been stable for over 2 years.  Next film to be done in April 2023.  Time  spent 44 minutes  Baltazar Apo, MD, PhD 11/26/2020, 5:28 PM [facility] [phone] or if no answer before 7:00PM call [phone] For any issues after 7:00PM please call eLink [phone]

## 2020-11-26 NOTE — Patient Instructions (Signed)
Full PFT performed today. °

## 2020-11-26 NOTE — Assessment & Plan Note (Signed)
Following groundglass nodule in the right middle lobe.  Has been stable for over 2 years.  Next film to be done in April 2023.

## 2020-12-01 ENCOUNTER — Ambulatory Visit (AMBULATORY_SURGERY_CENTER): Payer: Medicare Other | Admitting: Gastroenterology

## 2020-12-01 ENCOUNTER — Other Ambulatory Visit: Payer: Self-pay

## 2020-12-01 ENCOUNTER — Encounter: Payer: Self-pay | Admitting: Gastroenterology

## 2020-12-01 VITALS — BP 112/64 | HR 73 | Temp 96.9°F | Resp 13 | Ht 72.0 in | Wt 190.0 lb

## 2020-12-01 DIAGNOSIS — D123 Benign neoplasm of transverse colon: Secondary | ICD-10-CM | POA: Diagnosis not present

## 2020-12-01 DIAGNOSIS — Z800 Family history of malignant neoplasm of digestive organs: Secondary | ICD-10-CM

## 2020-12-01 DIAGNOSIS — Z86010 Personal history of colonic polyps: Secondary | ICD-10-CM

## 2020-12-01 DIAGNOSIS — D124 Benign neoplasm of descending colon: Secondary | ICD-10-CM | POA: Diagnosis not present

## 2020-12-01 DIAGNOSIS — D120 Benign neoplasm of cecum: Secondary | ICD-10-CM | POA: Diagnosis not present

## 2020-12-01 MED ORDER — SODIUM CHLORIDE 0.9 % IV SOLN: 500.0000 mL | Freq: Once | INTRAVENOUS | Status: DC

## 2020-12-01 NOTE — Progress Notes (Signed)
VS by SM  Pt's states no medical or surgical changes since previsit or office visit.  

## 2020-12-01 NOTE — Patient Instructions (Signed)
YOU HAD AN ENDOSCOPIC PROCEDURE TODAY AT THE [facility]:   Refer to the procedure report that was given to you for any specific questions about what was found during the examination.  If the procedure report does not answer your questions, please call your gastroenterologist to clarify.  If you requested that your care partner not be given the details of your procedure findings, then the procedure report has been included in a sealed envelope for you to review at your convenience later.  YOU SHOULD EXPECT: Some feelings of bloating in the abdomen. Passage of more gas than usual.  Walking can help get rid of the air that was put into your GI tract during the procedure and reduce the bloating. If you had a lower endoscopy (such as a colonoscopy or flexible sigmoidoscopy) you may notice spotting of blood in your stool or on the toilet paper. If you underwent a bowel prep for your procedure, you may not have a normal bowel movement for a few days.  Please Note:  You might notice some irritation and congestion in your nose or some drainage.  This is from the oxygen used during your procedure.  There is no need for concern and it should clear up in a day or so.  SYMPTOMS TO REPORT IMMEDIATELY:   Following lower endoscopy (colonoscopy or flexible sigmoidoscopy):  Excessive amounts of blood in the stool  Significant tenderness or worsening of abdominal pains  Swelling of the abdomen that is new, acute  Fever of 100F or higher   Following upper endoscopy (EGD)  Vomiting of blood or coffee ground material  New chest pain or pain under the shoulder blades  Painful or persistently difficult swallowing  New shortness of breath  Fever of 100F or higher  Black, tarry-looking stools  For urgent or emergent issues, a gastroenterologist can be reached at any hour by calling [phone]. Do not use MyChart messaging for urgent concerns.    DIET:  We do recommend a small meal at first, but  then you may proceed to your regular diet.  Drink plenty of fluids but you should avoid alcoholic beverages for 24 hours.  ACTIVITY:  You should plan to take it easy for the rest of today and you should NOT DRIVE or use heavy machinery until tomorrow (because of the sedation medicines used during the test).    FOLLOW UP: Our staff will call the number listed on your records 48-72 hours following your procedure to check on you and address any questions or concerns that you may have regarding the information given to you following your procedure. If we do not reach you, we will leave a message.  We will attempt to reach you two times.  During this call, we will ask if you have developed any symptoms of COVID 19. If you develop any symptoms (ie: fever, flu-like symptoms, shortness of breath, cough etc.) before then, please call [phone].  If you test positive for Covid 19 in the 2 weeks post procedure, please call and report this information to us.    If any biopsies were taken you will be contacted by phone or by letter within the next 1-3 weeks.  Please call us at [phone] if you have not heard about the biopsies in 3 weeks.    SIGNATURES/CONFIDENTIALITY: You and/or your care partner have signed paperwork which will be entered into your electronic medical record.  These signatures attest to the fact that that the information above on   your After Visit Summary has been reviewed and is understood.  Full responsibility of the confidentiality of this discharge information lies with you and/or your care-partner. 

## 2020-12-01 NOTE — Progress Notes (Signed)
Report given to PACU, vss 

## 2020-12-01 NOTE — Op Note (Addendum)
[facility] Patient Name: Aaron Weaver Procedure Date: 12/01/2020 9:37 AM MRN: [id number] Endoscopist: Ladene Artist , MD Age: 65 Referring MD:  Date of Birth: 1955-11-03 Gender: Male Account #: [id number] Procedure:                Colonoscopy Indications:              Surveillance: Personal history of adenomatous                            polyps on last colonoscopy 3 years ago. Family                            history of colon cancer, first degree relative. Medicines:                Monitored Anesthesia Care Procedure:                Pre-Anesthesia Assessment:                           - Prior to the procedure, a History and Physical                            was performed, and patient medications and                            allergies were reviewed. The patient's tolerance of                            previous anesthesia was also reviewed. The risks                            and benefits of the procedure and the sedation                            options and risks were discussed with the patient.                            All questions were answered, and informed consent                            was obtained. Prior Anticoagulants: The patient has                            taken no previous anticoagulant or antiplatelet                            agents. ASA Grade Assessment: II - A patient with                            mild systemic disease. After reviewing the risks                            and benefits, the patient was deemed in  satisfactory condition to undergo the procedure.                           After obtaining informed consent, the colonoscope                            was passed under direct vision. Throughout the                            procedure, the patient's blood pressure, pulse, and                            oxygen saturations were monitored continuously. The                            CF [id number]  was introduced through the anus                            and advanced to the the cecum, identified by                            appendiceal orifice and ileocecal valve. The                            ileocecal valve, appendiceal orifice, and rectum                            were photographed. The quality of the bowel                            preparation was good. The colonoscopy was performed                            without difficulty. The patient tolerated the                            procedure well. Scope In: 9:50:01 AM Scope Out: 10:04:40 AM Scope Withdrawal Time: 0 hours 13 minutes 9 seconds  Total Procedure Duration: 0 hours 14 minutes 39 seconds  Findings:                 The perianal and digital rectal examinations were                            normal.                           Four sessile polyps were found in the descending                            colon (2), transverse colon (1) and cecum (1). The                            polyps were 5 to 8 mm in size. These polyps were  removed with a cold snare. Resection and retrieval                            were complete.                           Internal hemorrhoids were found during                            retroflexion. The hemorrhoids were moderate and                            Grade I (internal hemorrhoids that do not prolapse).                           The exam was otherwise without abnormality on                            direct and retroflexion views. Complications:            No immediate complications. Estimated blood loss:                            None. Estimated Blood Loss:     Estimated blood loss: none. Impression:               - Four 5 to 8 mm polyps in the descending colon, in                            the transverse colon and in the cecum, removed with                            a cold snare. Resected and retrieved.                           - Internal hemorrhoids.                            - The examination was otherwise normal on direct                            and retroflexion views. Recommendation:           - Repeat colonoscopy after studies are complete for                            surveillance based on pathology results.                           - Patient has a contact number available for                            emergencies. The signs and symptoms of potential                            delayed complications were discussed with the  patient. Return to normal activities tomorrow.                            Written discharge instructions were provided to the                            patient.                           - Resume previous diet.                           - Continue present medications.                           - Await pathology results. Ladene Artist, MD 12/01/2020 10:12:25 AM This report has been signed electronically.

## 2020-12-01 NOTE — Progress Notes (Signed)
Called to room to assist during endoscopic procedure.  Patient ID and intended procedure confirmed with present staff. Received instructions for my participation in the procedure from the performing physician.  

## 2020-12-03 ENCOUNTER — Telehealth: Payer: Self-pay | Admitting: *Deleted

## 2020-12-03 ENCOUNTER — Telehealth: Payer: Self-pay

## 2020-12-03 NOTE — Telephone Encounter (Signed)
  Follow up Call-  Call back number 12/01/2020  Post procedure Call Back phone  # [phone]- ok to talk to wife  Permission to leave phone message Yes  Some recent data might be hidden     Patient questions:  Message left to call us if necessary.

## 2020-12-03 NOTE — Telephone Encounter (Signed)
  Follow up Call-  Call back number 12/01/2020  Post procedure Call Back phone  # [phone]- ok to talk to wife  Permission to leave phone message Yes  Some recent data might be hidden     Patient questions:  Do you have a fever, pain , or abdominal swelling? No. Pain Score  0 *  Have you tolerated food without any problems? Yes.    Have you been able to return to your normal activities? Yes.    Do you have any questions about your discharge instructions: Diet   No. Medications  No. Follow up visit  No.  Do you have questions or concerns about your Care? No.  Actions: * If pain score is 4 or above: No action needed, pain <4.

## 2020-12-09 DIAGNOSIS — M7582 Other shoulder lesions, left shoulder: Secondary | ICD-10-CM | POA: Diagnosis not present

## 2020-12-09 DIAGNOSIS — M19012 Primary osteoarthritis, left shoulder: Secondary | ICD-10-CM | POA: Diagnosis not present

## 2020-12-21 NOTE — Telephone Encounter (Signed)
Have him stop the Spiriva Try starting Striverdi 2 puffs once a day >> less likely to cause any of these side effects.

## 2020-12-21 NOTE — Telephone Encounter (Signed)
Dr. Lamonte Sakai, please advise on mychart message below, thanks!  This inhaler is starting to affect my urination. I don't feel like my bladder is emptying at night. Please advise. Thank you, Aaron Weaver

## 2020-12-22 MED ORDER — STRIVERDI RESPIMAT 2.5 MCG/ACT IN AERS: 2.0000 | INHALATION_SPRAY | Freq: Every day | RESPIRATORY_TRACT | 0 refills | Status: DC

## 2020-12-24 ENCOUNTER — Encounter: Payer: Self-pay | Admitting: Gastroenterology

## 2020-12-29 ENCOUNTER — Telehealth: Payer: Self-pay | Admitting: Emergency Medicine

## 2020-12-29 MED ORDER — SEREVENT DISKUS 50 MCG/DOSE IN AEPB: 1.0000 | INHALATION_SPRAY | Freq: Two times a day (BID) | RESPIRATORY_TRACT | 6 refills | Status: DC

## 2020-12-29 NOTE — Telephone Encounter (Signed)
Patient states that his insurance company will cover Serevent inhaler. The cost of Striverdi is $278.00, patient can not afford it. Is it ok to switch to Serevent?  Dr. Lamonte Sakai please advise.  Thank you

## 2020-12-29 NOTE — Telephone Encounter (Signed)
Serevent disc has been sent to the pharmacy.  Nothing further is needed.

## 2020-12-29 NOTE — Telephone Encounter (Signed)
Yes ok trying to change to serevent

## 2021-01-13 ENCOUNTER — Other Ambulatory Visit: Payer: Self-pay | Admitting: *Deleted

## 2021-01-13 DIAGNOSIS — I714 Abdominal aortic aneurysm, without rupture, unspecified: Secondary | ICD-10-CM

## 2021-01-20 DIAGNOSIS — M7582 Other shoulder lesions, left shoulder: Secondary | ICD-10-CM | POA: Diagnosis not present

## 2021-01-24 DIAGNOSIS — M8580 Other specified disorders of bone density and structure, unspecified site: Secondary | ICD-10-CM | POA: Diagnosis not present

## 2021-01-24 DIAGNOSIS — E7800 Pure hypercholesterolemia, unspecified: Secondary | ICD-10-CM | POA: Diagnosis not present

## 2021-01-24 DIAGNOSIS — I10 Essential (primary) hypertension: Secondary | ICD-10-CM | POA: Diagnosis not present

## 2021-01-24 DIAGNOSIS — E119 Type 2 diabetes mellitus without complications: Secondary | ICD-10-CM | POA: Diagnosis not present

## 2021-01-24 DIAGNOSIS — E1165 Type 2 diabetes mellitus with hyperglycemia: Secondary | ICD-10-CM | POA: Diagnosis not present

## 2021-01-24 DIAGNOSIS — K219 Gastro-esophageal reflux disease without esophagitis: Secondary | ICD-10-CM | POA: Diagnosis not present

## 2021-01-24 DIAGNOSIS — J439 Emphysema, unspecified: Secondary | ICD-10-CM | POA: Diagnosis not present

## 2021-01-24 DIAGNOSIS — E1169 Type 2 diabetes mellitus with other specified complication: Secondary | ICD-10-CM | POA: Diagnosis not present

## 2021-01-24 NOTE — Telephone Encounter (Signed)
None of the long acting inhalers are going to be much less expensive than the serevent at $105. ? Whether he is in the donut hole.   If all of the long-acting meds are too expensive then he will need to use the albuterol, at least for now. Maybe we will be able to get him financial assistance

## 2021-01-24 NOTE — Telephone Encounter (Signed)
Dr. Lamonte Sakai, please see mychart message below, thanks!  After checking with the insurance  they will only pay for Serevent. It will cost me 105.00 a month! I am retired and can't afford that. My meds already cost me more than 300.00 a month. Can I just use my rescue inhaler? If not what should I do? Thank you,Aaron Weaver

## 2021-02-01 ENCOUNTER — Ambulatory Visit: Payer: Medicare Other | Admitting: Vascular Surgery

## 2021-02-25 ENCOUNTER — Other Ambulatory Visit: Payer: Self-pay

## 2021-02-25 ENCOUNTER — Ambulatory Visit
Admission: RE | Admit: 2021-02-25 | Discharge: 2021-02-25 | Disposition: A | Discharge status: Home / Self Care | Payer: Medicare Other | Source: Ambulatory Visit | Attending: Vascular Surgery | Admitting: Vascular Surgery

## 2021-02-25 DIAGNOSIS — K7689 Other specified diseases of liver: Secondary | ICD-10-CM | POA: Diagnosis not present

## 2021-02-25 DIAGNOSIS — I714 Abdominal aortic aneurysm, without rupture, unspecified: Secondary | ICD-10-CM

## 2021-02-25 MED ORDER — IOPAMIDOL (ISOVUE-370) INJECTION 76%
75.0000 mL | Freq: Once | INTRAVENOUS | Status: AC | PRN
  Administered 2021-02-25: 75 mL via INTRAVENOUS

## 2021-03-01 ENCOUNTER — Encounter: Payer: Self-pay | Admitting: Vascular Surgery

## 2021-03-01 ENCOUNTER — Ambulatory Visit: Payer: Medicare Other | Admitting: Vascular Surgery

## 2021-03-01 ENCOUNTER — Other Ambulatory Visit: Payer: Self-pay

## 2021-03-01 VITALS — BP 136/85 | HR 90 | Temp 97.5°F | Resp 18 | Ht 72.0 in | Wt 198.0 lb

## 2021-03-01 DIAGNOSIS — I7143 Infrarenal abdominal aortic aneurysm, without rupture: Secondary | ICD-10-CM

## 2021-03-01 NOTE — Progress Notes (Signed)
Patient name: Aaron MAGOS Sr. MRN: [id number] DOB: 1955-11-17 Sex: male  REASON FOR VISIT: 36-month follow-up for surveillance of EVAR HPI: [name] is a 65 y.o. male multiple medical issues that presents for 92-month follow-up after EVAR.  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  Postop course was complicated by re-admission for deydration with vague leg symptoms that ultimately resolved.    He was recently diagnosed with COPD.  He has no complaints today otherwise.  No abdominal or back pain.  States overall he feels well.  There is a question of lymphoproliferative process on his CT scan raised by radiology.  He denies any fevers or chills at home.  No weight loss.  Appetite is good.  Overall he feels very well.  Denies any history of previous malignancy.    Past Medical History:  Diagnosis Date   AAA (abdominal aortic aneurysm)    Acute sinusitis, unspecified    Allergic rhinitis    seasonal   Allergy    Anxiety state, unspecified    COPD (chronic obstructive pulmonary disease) (HCC)    Corns and callosities    toe   Cough    Cramp of limb    legs   Depression    Dysrhythmia    GERD (gastroesophageal reflux disease)    Impacted cerumen    Impotence of organic origin    Lumbago    Other and unspecified hyperlipidemia    Routine general medical examination at a health care facility    Skipped heart beats    Tobacco use disorder    Type II or unspecified type diabetes mellitus without mention of complication, not stated as uncontrolled    type II   Unspecified essential hypertension     Past Surgical History:  Procedure Laterality Date   ABDOMINAL AORTIC ENDOVASCULAR STENT GRAFT Bilateral 07/12/2020   Procedure: ABDOMINAL AORTIC ENDOVASCULAR STENT GRAFT;  Surgeon: [name], MD;  Location: [facility] OR;  Service: Vascular;  Laterality: Bilateral;   BACK SURGERY     CARPECTOMY Right  09/11/2019   Procedure: PROXIMAL ROW CARPECTOMY; RADIAL STYLOIDECTOMY; POSTERIOR INTEROSSIUS NERVE RESECTION;  Surgeon: Daryll Brod, MD;  Location: [facility];  Service: Orthopedics;  Laterality: Right;  AXILLARY BLOCK   COLONOSCOPY     INGUINAL HERNIA REPAIR     right   POLYPECTOMY     TONSILLECTOMY      Family History  Problem Relation Age of Onset   Colon cancer Mother 13   Hypertension Father    Coronary artery disease Other        male<60 and male<50  1st degree relative   Colon polyps Brother    Esophageal cancer Neg Hx    Rectal cancer Neg Hx    Stomach cancer Neg Hx     SOCIAL HISTORY: Social History   Tobacco Use   Smoking status: Former    Packs/day: 0.50    Years: 53.00    Pack years: 26.50    Types: Cigarettes    Start date: 03/31/1965    Quit date: 07/22/2017    Years since quitting: 3.6   Smokeless tobacco: Never   Tobacco comments:    started at age 78.  Substance Use Topics   Alcohol use: No    Allergies  Allergen Reactions   Fexofenadine Other (See Comments)    "dries me out too much"   Hydrochlorothiazide Other (See Comments)  REACTION: cramps   Loratadine Other (See Comments)    sleepy   Penicillin V Other (See Comments)   Penicillins Hives   Pravastatin Sodium Other (See Comments)    REACTION: aches   Pravastatin Sodium Other (See Comments)    REACTION: aches   Requip [Ropinirole Hcl] Other (See Comments)    Bad dreams   Sudafed [Pseudoephedrine] Other (See Comments)    dry    Current Outpatient Medications  Medication Sig Dispense Refill   acetaminophen (TYLENOL) 325 MG tablet Take 650 mg by mouth as needed for mild pain, moderate pain or headache.     albuterol (PROAIR HFA) 108 (90 Base) MCG/ACT inhaler Inhale 1-2 puffs into the lungs every 6 (six) hours as needed for wheezing or shortness of breath. 8 g 6   alendronate (FOSAMAX) 70 MG tablet Take 70 mg by mouth every Sunday.     aspirin EC 81 MG EC tablet Take 1  tablet (81 mg total) by mouth daily at 6 (six) AM. Swallow whole. 30 tablet 11   atenolol (TENORMIN) 100 MG tablet Take 100 mg by mouth daily.     calcium carbonate (OSCAL) 1500 (600 Ca) MG TABS tablet Take 600 mg of elemental calcium by mouth every evening.     DULoxetine (CYMBALTA) 60 MG capsule Take 60 mg by mouth at bedtime.     Fish Oil OIL Take 1,500 mg by mouth daily.     glimepiride (AMARYL) 2 MG tablet Take 1 tablet (2 mg total) by mouth daily before breakfast. 90 tablet 3   JANUMET XR 50-1000 MG TB24 Take 2 tablets by mouth at bedtime. 30 tablet    Olodaterol HCl (STRIVERDI RESPIMAT) 2.5 MCG/ACT AERS Inhale 2 puffs into the lungs daily. 1 each 0   omeprazole (PRILOSEC) 40 MG capsule Take 40 mg by mouth daily.     potassium chloride SA (KLOR-CON M20) 20 MEQ tablet TAKE 1 TABLET (20 MEQ TOTAL) BY MOUTH DAILY.  30 tablet 5   rosuvastatin (CRESTOR) 10 MG tablet Take 10 mg by mouth daily.     salmeterol (SEREVENT DISKUS) 50 MCG/DOSE diskus inhaler Inhale 1 puff into the lungs in the morning and at bedtime. 1 each 6   tadalafil (CIALIS) 5 MG tablet Take 5 mg by mouth daily.     tamsulosin (FLOMAX) 0.4 MG CAPS capsule Take 0.4 mg by mouth every evening.     Tiotropium Bromide Monohydrate (SPIRIVA RESPIMAT) 2.5 MCG/ACT AERS Inhale 2 puffs into the lungs daily. 4 g 0   Zinc 50 MG TABS Take 50 mg by mouth daily.     No current facility-administered medications for this visit.    REVIEW OF SYSTEMS:  [X]  denotes positive finding, [ ]  denotes negative finding Cardiac  Comments:  Chest pain or chest pressure:    Shortness of breath upon exertion:    Short of breath when lying flat:    Irregular heart rhythm:        Vascular    Pain in calf, thigh, or hip brought on by ambulation:    Pain in feet at night that wakes you up from your sleep:     Blood clot in your veins:    Leg swelling:         Pulmonary    Oxygen at home:    Productive cough:     Wheezing:         Neurologic     Sudden weakness in arms or legs:  Sudden numbness in arms or legs:     Sudden onset of difficulty speaking or slurred speech:    Temporary loss of vision in one eye:     Problems with dizziness:         Gastrointestinal    Blood in stool:     Vomited blood:         Genitourinary    Burning when urinating:     Blood in urine:        Psychiatric    Major depression:         Hematologic    Bleeding problems:    Problems with blood clotting too easily:        Skin    Rashes or ulcers:        Constitutional    Fever or chills:      PHYSICAL EXAM: Vitals:   03/01/21 0829  BP: 136/85  Pulse: 90  Resp: 18  Temp: (!) 97.5 F (36.4 C)  TempSrc: Temporal  SpO2: 97%  Weight: 198 lb (89.8 kg)  Height: 6' (1.829 m)    GENERAL: The patient is a well-nourished male, in no acute distress. The vital signs are documented above. CARDIAC: There is a regular rate and rhythm.  VASCULAR:  Palpable femoral pulses bilateral Palpable PT pulses bilaterally PULMONARY: No respiratory distress. ABDOMEN: Soft and non-tender.  No pain with deep palpation.   DATA:   Since CTA abdomen pelvis dated 08/09/2020;   1. Endograft repair of AAA with stable size of excluded aneurysm sac. No CTA evidence of endoleak. 2. No acute vascular abdominopelvic findings.   NON-VASCULAR   1. Persistence of mildly-enhancing soft tissue fullness involving the LEFT kidney at the hilum and inferior renal pole, and retroperitoneum at the RIGHT hypogastric bifurcation and lateral to the LEFT CIA (see key images). Findings suspicious for lymphoproliferative process, attention on follow-up and consider oncological workup if not previously performed. 2. 1.0 cm RIGHT hepatic lobe enhancing lesion, likely a small flash fill hemangioma. Attention on follow-up. 3. No acute nonvascular abdominopelvic findings.  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  I reviewed his most recent CTA and the stent graft appears in excellent position.  There is no evidence of endoleak and the aneurysm is appropriately excluded and both limbs of the stent graft are widely patent.  We will have him come back in 1 year with EVAR duplex in the [facility].  Radiology has raised the question of lymphoproliferative process on his CT scan given enhancing soft tissue around the left kidney as well as in the retroperitoneum.  They have recommended oncologic work-up.  I discussed a referral to hematology oncology to decide if further work-up is appropriate and what would be the best course of action.  I am not sure the clinical significance of these findings as I discussed with him and his wife today and will allow hematology oncology to direct any further work-up at their discretion.     [name], MD [facility] Office: [phone]

## 2021-03-02 DIAGNOSIS — E7800 Pure hypercholesterolemia, unspecified: Secondary | ICD-10-CM | POA: Diagnosis not present

## 2021-03-02 DIAGNOSIS — I10 Essential (primary) hypertension: Secondary | ICD-10-CM | POA: Diagnosis not present

## 2021-03-02 DIAGNOSIS — K219 Gastro-esophageal reflux disease without esophagitis: Secondary | ICD-10-CM | POA: Diagnosis not present

## 2021-03-02 DIAGNOSIS — E1169 Type 2 diabetes mellitus with other specified complication: Secondary | ICD-10-CM | POA: Diagnosis not present

## 2021-03-02 DIAGNOSIS — E1165 Type 2 diabetes mellitus with hyperglycemia: Secondary | ICD-10-CM | POA: Diagnosis not present

## 2021-03-02 DIAGNOSIS — E119 Type 2 diabetes mellitus without complications: Secondary | ICD-10-CM | POA: Diagnosis not present

## 2021-03-02 DIAGNOSIS — J439 Emphysema, unspecified: Secondary | ICD-10-CM | POA: Diagnosis not present

## 2021-03-02 DIAGNOSIS — M8580 Other specified disorders of bone density and structure, unspecified site: Secondary | ICD-10-CM | POA: Diagnosis not present

## 2021-03-08 ENCOUNTER — Telehealth: Payer: Self-pay | Admitting: Physician Assistant

## 2021-03-08 NOTE — Telephone Encounter (Signed)
Scheduled appt per 10/28 referral. Pt's wife is aware of appt date and time.

## 2021-03-16 DIAGNOSIS — I10 Essential (primary) hypertension: Secondary | ICD-10-CM | POA: Diagnosis not present

## 2021-03-16 DIAGNOSIS — E1169 Type 2 diabetes mellitus with other specified complication: Secondary | ICD-10-CM | POA: Diagnosis not present

## 2021-03-16 DIAGNOSIS — J439 Emphysema, unspecified: Secondary | ICD-10-CM | POA: Diagnosis not present

## 2021-03-16 DIAGNOSIS — E119 Type 2 diabetes mellitus without complications: Secondary | ICD-10-CM | POA: Diagnosis not present

## 2021-03-16 DIAGNOSIS — K219 Gastro-esophageal reflux disease without esophagitis: Secondary | ICD-10-CM | POA: Diagnosis not present

## 2021-03-16 DIAGNOSIS — M8580 Other specified disorders of bone density and structure, unspecified site: Secondary | ICD-10-CM | POA: Diagnosis not present

## 2021-03-16 DIAGNOSIS — E7800 Pure hypercholesterolemia, unspecified: Secondary | ICD-10-CM | POA: Diagnosis not present

## 2021-03-16 DIAGNOSIS — E1165 Type 2 diabetes mellitus with hyperglycemia: Secondary | ICD-10-CM | POA: Diagnosis not present

## 2021-03-22 ENCOUNTER — Inpatient Hospital Stay: Payer: Medicare Other | Attending: Physician Assistant | Admitting: Physician Assistant

## 2021-03-22 ENCOUNTER — Inpatient Hospital Stay (HOSPITAL_BASED_OUTPATIENT_CLINIC_OR_DEPARTMENT_OTHER): Payer: Medicare Other | Admitting: Physician Assistant

## 2021-03-22 ENCOUNTER — Encounter: Payer: Self-pay | Admitting: Physician Assistant

## 2021-03-22 ENCOUNTER — Other Ambulatory Visit: Payer: Self-pay

## 2021-03-22 VITALS — BP 149/87 | HR 86 | Temp 97.3°F | Resp 20 | Wt 203.0 lb

## 2021-03-22 DIAGNOSIS — N2889 Other specified disorders of kidney and ureter: Secondary | ICD-10-CM | POA: Diagnosis not present

## 2021-03-22 DIAGNOSIS — M7989 Other specified soft tissue disorders: Secondary | ICD-10-CM | POA: Insufficient documentation

## 2021-03-22 DIAGNOSIS — R591 Generalized enlarged lymph nodes: Secondary | ICD-10-CM

## 2021-03-22 LAB — CBC WITH DIFFERENTIAL (CANCER CENTER ONLY)
Abs Immature Granulocytes: 0.03 10*3/uL (ref 0.00–0.07)
Basophils Absolute: 0.1 10*3/uL (ref 0.0–0.1)
Basophils Relative: 1 %
Eosinophils Absolute: 0.1 10*3/uL (ref 0.0–0.5)
Eosinophils Relative: 2 %
HCT: 39.6 % (ref 39.0–52.0)
Hemoglobin: 13.6 g/dL (ref 13.0–17.0)
Immature Granulocytes: 1 %
Lymphocytes Relative: 19 %
Lymphs Abs: 1.2 10*3/uL (ref 0.7–4.0)
MCH: 29.4 pg (ref 26.0–34.0)
MCHC: 34.3 g/dL (ref 30.0–36.0)
MCV: 85.5 fL (ref 80.0–100.0)
Monocytes Absolute: 0.6 10*3/uL (ref 0.1–1.0)
Monocytes Relative: 9 %
Neutro Abs: 4 10*3/uL (ref 1.7–7.7)
Neutrophils Relative %: 68 %
Platelet Count: 158 10*3/uL (ref 150–400)
RBC: 4.63 MIL/uL (ref 4.22–5.81)
RDW: 13.6 % (ref 11.5–15.5)
WBC Count: 5.9 10*3/uL (ref 4.0–10.5)
nRBC: 0 % (ref 0.0–0.2)

## 2021-03-22 LAB — CMP (CANCER CENTER ONLY)
ALT: 29 U/L (ref 0–44)
AST: 24 U/L (ref 15–41)
Albumin: 4 g/dL (ref 3.5–5.0)
Alkaline Phosphatase: 63 U/L (ref 38–126)
Anion gap: 9 (ref 5–15)
BUN: 19 mg/dL (ref 8–23)
CO2: 23 mmol/L (ref 22–32)
Calcium: 9 mg/dL (ref 8.9–10.3)
Chloride: 105 mmol/L (ref 98–111)
Creatinine: 1.05 mg/dL (ref 0.61–1.24)
GFR, Estimated: >60 mL/min (ref >60)
Glucose, Bld: 285 mg/dL — ABNORMAL HIGH (ref 70–99)
Potassium: 4.5 mmol/L (ref 3.5–5.1)
Sodium: 137 mmol/L (ref 135–145)
Total Bilirubin: 0.4 mg/dL (ref 0.3–1.2)
Total Protein: 7.1 g/dL (ref 6.5–8.1)

## 2021-03-22 LAB — SEDIMENTATION RATE: Sed Rate: 5 mm/hr (ref 0–16)

## 2021-03-22 LAB — LACTATE DEHYDROGENASE: LDH: 154 U/L (ref 98–192)

## 2021-03-22 LAB — C-REACTIVE PROTEIN: CRP: 0.7 mg/dL (ref <1.0)

## 2021-03-22 NOTE — Progress Notes (Signed)
[place] [phone]   [phone]  INITIAL CONSULTATION:  Patient Care Team: [name], MD as PCP - General (Family Medicine) Jerline Pain, MD as PCP - Cardiology (Cardiology)  CHIEF COMPLAINTS/PURPOSE OF CONSULTATION:  Soft tissue fullness involving the left kidney  HISTORY OF PRESENTING ILLNESS:  Aaron Fee Sr. 65 y.o. male with medical history significant for AAA, COPD, GERD, hyperlipidemia, T2DM, and hypertension. He is accompanied by his wife for this visit.   On review of the previous records, patient underwent CT imaging in February 2022 for abdominal aortic aneurysm surveillance. There was incidental finding of a soft tissue fullness involving the left kidney at the hilum and inferior renal pole, and retroperitoneum at the right hypogastric bifurcation and lateral to the left common iliac artery. Repeat imaging most recently on 02/25/2021 showed persistent soft tissue presentation that was concerning for lymphoproliferative process.   On exam today, Aaron Weaver reports feeling well without any new or concerning symptoms. His energy levels are stable and he continues to complete all his daily activities on his own. He denies any changes to his appetite or weight. He denies nausea, vomiting or abdominal pain. His bowel habits are unchanged without any diarrhea or constipation. He denies easy bruising or signs of bleeding. He denies fevers, chills, night sweats, shortness of breath, chest pain or cough. He has no other complaints. Rest of the 10 point ROS is below.   MEDICAL HISTORY:  Past Medical History:  Diagnosis Date   AAA (abdominal aortic aneurysm)    Acute sinusitis, unspecified    Allergic rhinitis    seasonal   Allergy    Anxiety state, unspecified    COPD (chronic obstructive pulmonary disease) (HCC)    Corns and callosities    toe   Cough    Cramp of limb    legs   Depression    Dysrhythmia     GERD (gastroesophageal reflux disease)    Impacted cerumen    Impotence of organic origin    Lumbago    Other and unspecified hyperlipidemia    Routine general medical examination at a health care facility    Skipped heart beats    Tobacco use disorder    Type II or unspecified type diabetes mellitus without mention of complication, not stated as uncontrolled    type II   Unspecified essential hypertension     SURGICAL HISTORY: Past Surgical History:  Procedure Laterality Date   ABDOMINAL AORTIC ENDOVASCULAR STENT GRAFT Bilateral 07/12/2020   Procedure: ABDOMINAL AORTIC ENDOVASCULAR STENT GRAFT;  Surgeon: [name], MD;  Location: [facility] OR;  Service: Vascular;  Laterality: Bilateral;   BACK SURGERY     CARPECTOMY Right 09/11/2019   Procedure: PROXIMAL ROW CARPECTOMY; RADIAL STYLOIDECTOMY; POSTERIOR INTEROSSIUS NERVE RESECTION;  Surgeon: Daryll Brod, MD;  Location: [place];  Service: Orthopedics;  Laterality: Right;  AXILLARY BLOCK   COLONOSCOPY     INGUINAL HERNIA REPAIR     right   POLYPECTOMY     TONSILLECTOMY      SOCIAL HISTORY: Social History   Socioeconomic History   Marital status: Married    Spouse name: Aaron Weaver   Number of children: 1   Years of education: Not on file   Highest education level: Not on file  Occupational History   Occupation: [employer]  Tobacco Use   Smoking status: Former    Packs/day: 0.50    Years: 53.00  Pack years: 26.50    Types: Cigarettes    Start date: 03/31/1965    Quit date: 07/22/2017    Years since quitting: 3.6   Smokeless tobacco: Never   Tobacco comments:    started at age 52.  Vaping Use   Vaping Use: Never used  Substance and Sexual Activity   Alcohol use: No   Drug use: No   Sexual activity: Yes  Other Topics Concern   Not on file  Social History Narrative   Not on file   Social Determinants of Health   Financial Resource Strain: Not on file  Food Insecurity: Not on file   Transportation Needs: Not on file  Physical Activity: Not on file  Stress: Not on file  Social Connections: Not on file  Intimate Partner Violence: Not on file    FAMILY HISTORY: Family History  Problem Relation Age of Onset   Colon cancer Mother 67   Hypertension Father    Coronary artery disease Other        male<60 and male<50  1st degree relative   Colon polyps Brother    Esophageal cancer Neg Hx    Rectal cancer Neg Hx    Stomach cancer Neg Hx     ALLERGIES:  is allergic to fexofenadine, hydrochlorothiazide, loratadine, penicillin v, penicillins, pravastatin sodium, pravastatin sodium, requip [ropinirole hcl], and sudafed [pseudoephedrine].  MEDICATIONS:  Current Outpatient Medications  Medication Sig Dispense Refill   acetaminophen (TYLENOL) 325 MG tablet Take 650 mg by mouth as needed for mild pain, moderate pain or headache.     albuterol (PROAIR HFA) 108 (90 Base) MCG/ACT inhaler Inhale 1-2 puffs into the lungs every 6 (six) hours as needed for wheezing or shortness of breath. 8 g 6   alendronate (FOSAMAX) 70 MG tablet Take 70 mg by mouth every Sunday.     aspirin EC 81 MG EC tablet Take 1 tablet (81 mg total) by mouth daily at 6 (six) AM. Swallow whole. 30 tablet 11   atenolol (TENORMIN) 100 MG tablet Take 100 mg by mouth daily.     calcium carbonate (OSCAL) 1500 (600 Ca) MG TABS tablet Take 600 mg of elemental calcium by mouth every evening.     DULoxetine (CYMBALTA) 60 MG capsule Take 60 mg by mouth at bedtime.     Fish Oil OIL Take 1,500 mg by mouth daily.     glimepiride (AMARYL) 2 MG tablet Take 1 tablet (2 mg total) by mouth daily before breakfast. 90 tablet 3   JANUMET XR 50-1000 MG TB24 Take 2 tablets by mouth at bedtime. 30 tablet    Olodaterol HCl (STRIVERDI RESPIMAT) 2.5 MCG/ACT AERS Inhale 2 puffs into the lungs daily. 1 each 0   omeprazole (PRILOSEC) 40 MG capsule Take 40 mg by mouth daily.     potassium chloride SA (KLOR-CON M20) 20 MEQ tablet TAKE 1  TABLET (20 MEQ TOTAL) BY MOUTH DAILY.  30 tablet 5   rosuvastatin (CRESTOR) 10 MG tablet Take 10 mg by mouth daily.     salmeterol (SEREVENT DISKUS) 50 MCG/DOSE diskus inhaler Inhale 1 puff into the lungs in the morning and at bedtime. 1 each 6   tadalafil (CIALIS) 5 MG tablet Take 5 mg by mouth daily.     tamsulosin (FLOMAX) 0.4 MG CAPS capsule Take 0.4 mg by mouth every evening.     Tiotropium Bromide Monohydrate (SPIRIVA RESPIMAT) 2.5 MCG/ACT AERS Inhale 2 puffs into the lungs daily. 4 g 0   Zinc  50 MG TABS Take 50 mg by mouth daily.     No current facility-administered medications for this visit.    REVIEW OF SYSTEMS:   Constitutional: ( - ) fevers, ( - )  chills , ( - ) night sweats Eyes: ( - ) blurriness of vision, ( - ) double vision, ( - ) watery eyes Ears, nose, mouth, throat, and face: ( - ) mucositis, ( - ) sore throat Respiratory: ( - ) cough, ( - ) dyspnea, ( - ) wheezes Cardiovascular: ( - ) palpitation, ( - ) chest discomfort, ( - ) lower extremity swelling Gastrointestinal:  ( - ) nausea, ( - ) heartburn, ( - ) change in bowel habits Skin: ( - ) abnormal skin rashes Lymphatics: ( - ) new lymphadenopathy, ( - ) easy bruising Neurological: ( - ) numbness, ( - ) tingling, ( - ) new weaknesses Behavioral/Psych: ( - ) mood change, ( - ) new changes  All other systems were reviewed with the patient and are negative.  PHYSICAL EXAMINATION: ECOG PERFORMANCE STATUS: 0 - Asymptomatic  Vitals:   03/22/21 1355  BP: (!) 149/87  Pulse: 86  Resp: 20  Temp: (!) 97.3 F (36.3 C)  SpO2: 98%   Filed Weights   03/22/21 1355  Weight: 203 lb (92.1 kg)    GENERAL: well appearing male in NAD  SKIN: skin color, texture, turgor are normal, no rashes or significant lesions EYES: conjunctiva are pink and non-injected, sclera clear OROPHARYNX: no exudate, no erythema; lips, buccal mucosa, and tongue normal  NECK: supple, non-tender LYMPH:  no palpable lymphadenopathy in the cervical,  axillary or supraclavicular lymph nodes.  LUNGS: clear to auscultation and percussion with normal breathing effort HEART: regular rate & rhythm and no murmurs and no lower extremity edema ABDOMEN: soft, non-tender, non-distended, normal bowel sounds Musculoskeletal: no cyanosis of digits and no clubbing  PSYCH: alert & oriented x 3, fluent speech NEURO: no focal motor/sensory deficits  LABORATORY DATA:  I have reviewed the data as listed CBC Latest Ref Rng & Units 03/22/2021 07/17/2020 07/16/2020  WBC 4.0 - 10.5 K/uL 5.9 6.0 8.3  Hemoglobin 13.0 - 17.0 g/dL 13.6 11.1(L) 12.9(L)  Hematocrit 39.0 - 52.0 % 39.6 30.7(L) 36.3(L)  Platelets 150 - 400 K/uL 158 139(L) 189    CMP Latest Ref Rng & Units 03/22/2021 07/17/2020 07/16/2020  Glucose 70 - 99 mg/dL 285(H) 285(H) 171(H)  BUN 8 - 23 mg/dL 19 24(H) 34(H)  Creatinine 0.61 - 1.24 mg/dL 1.05 1.13 1.59(H)  Sodium 135 - 145 mmol/L 137 132(L) 134(L)  Potassium 3.5 - 5.1 mmol/L 4.5 3.3(L) 3.4(L)  Chloride 98 - 111 mmol/L 105 99 98  CO2 22 - 32 mmol/L 23 25 26   Calcium 8.9 - 10.3 mg/dL 9.0 8.3(L) 8.6(L)  Total Protein 6.5 - 8.1 g/dL 7.1 - -  Total Bilirubin 0.3 - 1.2 mg/dL 0.4 - -  Alkaline Phos 38 - 126 U/L 63 - -  AST 15 - 41 U/L 24 - -  ALT 0 - 44 U/L 29 - -     RADIOGRAPHIC STUDIES: I have personally reviewed the radiological images as listed and agreed with the findings in the report. CT Angio Abd/Pel w/ and/or w/o  Result Date: 02/25/2021 CLINICAL DATA:  AAA surveillance. History of AAA endograft repair on 07/12/2020. EXAM: CTA ABDOMEN AND PELVIS WITHOUT AND WITH CONTRAST TECHNIQUE: Multidetector CT imaging of the abdomen and pelvis was performed using the standard protocol during bolus administration of intravenous  contrast. Multiplanar reconstructed images and MIPs were obtained and reviewed to evaluate the vascular anatomy. CONTRAST:  53mL ISOVUE-370 IOPAMIDOL (ISOVUE-370) INJECTION 76% COMPARISON:  CTA abdomen pelvis, 08/09/2020 and  06/14/2020. FINDINGS: VASCULAR Aorta: *Endograft repair of infrarenal abdominal aortic aneurysm with aorto bi-iliac stent. *Proximal attachment approximately 1 cm distal to the LEFT renal artery takeoff and distal attachment proximal to the common iliac artery bifurcations. *Apposed proximal distal attachments.  Intact graft material. *Excluded aneurysm sac measures approximately 4.2 x 3.5 cm AP by transaxial (previously 4.3 cm maximal diameter). *No arterial phase or delayed enhancement within the excluded aneurysm sac. Celiac: Patent without evidence of aneurysm, dissection, vasculitis or significant stenosis. SMA: Patent without evidence of aneurysm, dissection, vasculitis or significant stenosis. Renals: Incidental accessory RIGHT renal artery. Renal arteries are patent without evidence of aneurysm, dissection, vasculitis, fibromuscular dysplasia or significant stenosis. IMA: Occluded at origin, with retrograde opacification including into its branches. Inflow: Patent without evidence of aneurysm, dissection, vasculitis or significant stenosis. Proximal Outflow: Bilateral common femoral and visualized portions of the superficial and profunda femoral arteries are patent without evidence of aneurysm, dissection, vasculitis or significant stenosis. Veins: No obvious venous abnormality within the limitations of this arterial phase study. Review of the MIP images confirms the above findings. NON-VASCULAR Lower chest: No acute abnormality. Hepatobiliary: 1.0 cm rounded enhancing lesion within the RIGHT hepatic lobe, present on comparison and likely flash fill hemangioma. Attention on follow-up. No gallstones, gallbladder wall thickening, or biliary dilatation. Pancreas: Unremarkable. No pancreatic ductal dilatation or surrounding inflammatory changes. Spleen: Normal in size without focal abnormality. 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 Normal  RIGHT kidney. LEFT renal hilum non-avidly enhancing soft tissue fullness, and at the posterior renal pole. No renal calculi or hydronephrosis. Bladder is unremarkable. Stomach/Bowel: Stomach is within normal limits. Appendix appears normal. No evidence of bowel wall thickening, distention, or inflammatory changes. Lymphatic: Mass-like soft tissue fullness, measuring proximally 2.5 x 1.5 cm AP by transaxial (see image). The mass encases the RIGHT internal iliac artery bifurcation and proximal branches of the anterior circulation. Additional amorphous retroperitoneal mass-like soft tissue fullness lateral to the proximal LEFT common iliac artery (see key image). Reproductive: Prostate is unremarkable. Other: Small, fat-containing LEFT inguinal hernia versus cord lipoma. No abdominopelvic ascites. Musculoskeletal: T12 vertebral augmentation. No acute osseous findings. IMPRESSION: VASCULAR Since CTA abdomen pelvis dated 08/09/2020; 1. Endograft repair of AAA with stable size of excluded aneurysm sac. No CTA evidence of endoleak. 2. No acute vascular abdominopelvic findings. NON-VASCULAR 1. Persistence of mildly-enhancing soft tissue fullness involving the LEFT kidney at the hilum and inferior renal pole, and retroperitoneum at the RIGHT hypogastric bifurcation and lateral to the LEFT CIA (see key images). Findings suspicious for lymphoproliferative process, attention on follow-up and consider oncological workup if not previously performed. 2. 1.0 cm RIGHT hepatic lobe enhancing lesion, likely a small flash fill hemangioma. Attention on follow-up. 3. No acute nonvascular abdominopelvic findings. Michaelle Birks, MD [facility] Radiology Electronically Signed   By: Michaelle Birks M.D.   On: 02/25/2021 11:08    ASSESSMENT & PLAN Aaron DUNKLEE Sr. Is a 65 y.o. male who presents to the diagnostic clinic for evaluation following abnormal CT imaging. I reviewed the imaging in  detail that shows a persistent soft tissue mass involving the left kidney, retroperitoneum at the right hypogastric bifurcation and lateral to the left common iliac artery. Due to location and invasion of near by vessels, it  is uncertain if percutaneous biopsy is feasible. We will request review for biopsy by our colleagues in interventional radiology. Alternatively, we can monitor with serial CT imaging and only pursue a biopsy if the soft tissue mass grows. He would like to discuss his options with his family and follow up by the end of the week. At this time, he has agreed to proceed with serologic evaluation today and check CBC, CMP, LDH, sedimentation rate, c-reactive protein.   We will follow up with the patient by phone later this week to review lab results from today and discuss next steps   Orders Placed This Encounter  Procedures   CBC with Differential ([place] Only)    Standing Status:   Future    Number of Occurrences:   1    Standing Expiration Date:   03/22/2022   CMP ([place] only)    Standing Status:   Future    Number of Occurrences:   1    Standing Expiration Date:   03/22/2022   Lactate dehydrogenase (LDH)    Standing Status:   Future    Number of Occurrences:   1    Standing Expiration Date:   03/22/2022   Sedimentation rate    Standing Status:   Future    Number of Occurrences:   1    Standing Expiration Date:   03/22/2022   C-reactive protein    Standing Status:   Future    Number of Occurrences:   1    Standing Expiration Date:   03/22/2022    All questions were answered. The patient knows to call the clinic with any problems, questions or concerns.  I have spent a total of 60 minutes minutes of face-to-face and non-face-to-face time, preparing to see the patient, obtaining and/or reviewing separately obtained history, performing a medically appropriate examination, counseling and educating the patient, ordering tests, referring and communicating with  other health care professionals, documenting clinical information in the electronic health record,  and care coordination.   Dede Query, PA-C Department of Hematology/Oncology [place] at [facility] Phone: [phone]  Patient was seen with Dr. Lorenso Courier.   I have read the above note and personally examined the patient. I agree with the assessment and plan as noted above.  Briefly Aaron Weaver is a 65 year old male who presents for evaluation of a soft tissue mass near his left kidney.  Today we discussed the options moving forward including IR guided biopsy, observation, or urological intervention.  After discussing these options the patient noted he would like to pursue a biopsy if feasible.  We have reached out to IR regarding potential biopsy.  In the event that this mass cannot be biopsied because of the proximity to vessels then I would recommend a repeat CT scan in 3 months time in order to evaluate for growth or change of this lesion.  The patient voices understanding of this plan moving forward.   Ledell Peoples, MD Department of Hematology/Oncology [place] at [facility] Phone: [phone] Pager: [phone] Email: [email or web address]

## 2021-03-25 ENCOUNTER — Telehealth: Payer: Self-pay | Admitting: Physician Assistant

## 2021-03-25 DIAGNOSIS — M7989 Other specified soft tissue disorders: Secondary | ICD-10-CM

## 2021-03-25 NOTE — Telephone Encounter (Signed)
I called Aaron Weaver today to review lab results from 03/22/2021 and to discuss next steps regarding biopsy. Labs require no intervention. CBC was unremarkable. No indication for inflammation since CRP and Sed rate were normal.   Regarding undergoing biopsy for soft tissue mass, I reached out to Aaron Weaver from interventional  radiology. All the nodal targets are near arteries and the left ureter. It is recommended for patient to see urologist to undergo left ureteroscopy with stent placement.  If there is nothing for them to biopsy, then interventional radiology can attempt a CT guided biopsy of likely a left external iliac lymph node.    Patient expressed understanding and would like a consultation with Aaron Weaver from [facility]. I will make a referral to Aaron Weaver today. Patient is aware to follow up if he has any additional questions.

## 2021-03-28 ENCOUNTER — Telehealth: Payer: Self-pay

## 2021-03-28 NOTE — Telephone Encounter (Signed)
Referral faxed to [facility]/Dr Franchot Gallo [phone].  Confiramtion rec'd.

## 2021-03-28 NOTE — Telephone Encounter (Signed)
Error

## 2021-03-29 NOTE — Telephone Encounter (Signed)
[facility] called to advise they have scheduled the pt for 11/28 at 2:30pm but having issues reaching the pt. They only had pts home number and I have provided them with his cell number as well.

## 2021-04-04 DIAGNOSIS — R935 Abnormal findings on diagnostic imaging of other abdominal regions, including retroperitoneum: Secondary | ICD-10-CM | POA: Diagnosis not present

## 2021-04-07 ENCOUNTER — Telehealth: Payer: Self-pay | Admitting: Physician Assistant

## 2021-04-07 ENCOUNTER — Other Ambulatory Visit: Payer: Self-pay | Admitting: Physician Assistant

## 2021-04-07 DIAGNOSIS — R591 Generalized enlarged lymph nodes: Secondary | ICD-10-CM

## 2021-04-07 DIAGNOSIS — M7989 Other specified soft tissue disorders: Secondary | ICD-10-CM

## 2021-04-07 NOTE — Telephone Encounter (Signed)
I called Aaron Weaver to review the recommendations from Dr. Diona Fanti from [facility]. He recommends a PET scan so I went ahead and placed an ordered. I gave the patient the central radiology number to call and schedule the scan.

## 2021-04-15 ENCOUNTER — Telehealth: Payer: Self-pay | Admitting: Physician Assistant

## 2021-04-15 ENCOUNTER — Other Ambulatory Visit: Payer: Self-pay

## 2021-04-15 ENCOUNTER — Encounter (HOSPITAL_COMMUNITY)
Admission: RE | Admit: 2021-04-15 | Discharge: 2021-04-15 | Disposition: A | Discharge status: Home / Self Care | Payer: Medicare Other | Source: Ambulatory Visit | Attending: Physician Assistant | Admitting: Physician Assistant

## 2021-04-15 ENCOUNTER — Other Ambulatory Visit: Payer: Self-pay | Admitting: *Deleted

## 2021-04-15 ENCOUNTER — Telehealth: Payer: Self-pay | Admitting: *Deleted

## 2021-04-15 DIAGNOSIS — M7989 Other specified soft tissue disorders: Secondary | ICD-10-CM | POA: Insufficient documentation

## 2021-04-15 DIAGNOSIS — R591 Generalized enlarged lymph nodes: Secondary | ICD-10-CM | POA: Insufficient documentation

## 2021-04-15 DIAGNOSIS — N2889 Other specified disorders of kidney and ureter: Secondary | ICD-10-CM | POA: Diagnosis not present

## 2021-04-15 LAB — GLUCOSE, CAPILLARY
Glucose-Capillary: 303 mg/dL — ABNORMAL HIGH (ref 70–99)
Glucose-Capillary: 114 mg/dL — ABNORMAL HIGH (ref 70–99)

## 2021-04-15 MED ORDER — FLUDEOXYGLUCOSE F - 18 (FDG) INJECTION
10.1000 | Freq: Once | INTRAVENOUS | Status: AC
  Administered 2021-04-15: 10.1 via INTRAVENOUS

## 2021-04-15 NOTE — Telephone Encounter (Signed)
Call made to referral specialist regarding urgent referral requested by Dede Query, PA. Advised that clinic information and PET Scan results have been fax'd to their office. Receipt acknowledged.

## 2021-04-15 NOTE — Telephone Encounter (Addendum)
I called and spoke to Aaron Weaver and his wife to review the PET scan results form 04/15/2021. Findings revealed for metabolic soft tissue lesions and nodes involving the right side of the face, right axilla, left pleura, left renal hilum and pelvis concerning for lymphoproliferative disorder.  There is evidence of multifocal osseous involvement.  Recommendation is to obtain an excisional biopsy to confirm diagnosis.  We will place an urgent referral to [facility] today.  Patient expressed understanding and satisfaction with the plan provided.

## 2021-04-19 NOTE — Telephone Encounter (Signed)
Pt has been scheduled at [facility] for 04/26/21 at 11:15.  Per [facility] pt has been advised of date and time.

## 2021-04-21 ENCOUNTER — Encounter (HOSPITAL_BASED_OUTPATIENT_CLINIC_OR_DEPARTMENT_OTHER): Payer: Self-pay | Admitting: Surgery

## 2021-04-22 ENCOUNTER — Encounter (HOSPITAL_BASED_OUTPATIENT_CLINIC_OR_DEPARTMENT_OTHER)
Admission: RE | Admit: 2021-04-22 | Discharge: 2021-04-22 | Disposition: A | Discharge status: Home / Self Care | Payer: Medicare Other | Source: Ambulatory Visit | Attending: Surgery | Admitting: Surgery

## 2021-04-22 DIAGNOSIS — Z01812 Encounter for preprocedural laboratory examination: Secondary | ICD-10-CM | POA: Insufficient documentation

## 2021-04-22 LAB — BASIC METABOLIC PANEL
Anion gap: 11 (ref 5–15)
BUN: 18 mg/dL (ref 8–23)
CO2: 20 mmol/L — ABNORMAL LOW (ref 22–32)
Calcium: 9 mg/dL (ref 8.9–10.3)
Chloride: 102 mmol/L (ref 98–111)
Creatinine, Ser: 0.91 mg/dL (ref 0.61–1.24)
GFR, Estimated: >60 mL/min (ref >60)
Glucose, Bld: 290 mg/dL — ABNORMAL HIGH (ref 70–99)
Potassium: 5.1 mmol/L (ref 3.5–5.1)
Sodium: 133 mmol/L — ABNORMAL LOW (ref 135–145)

## 2021-04-25 ENCOUNTER — Ambulatory Visit: Payer: Self-pay | Admitting: Surgery

## 2021-04-26 DIAGNOSIS — R591 Generalized enlarged lymph nodes: Secondary | ICD-10-CM | POA: Insufficient documentation

## 2021-04-26 NOTE — Progress Notes (Signed)
Glucose-290, Dr. Elgie Congo aware, will recheck day of surgery.

## 2021-04-26 NOTE — H&P (Signed)
History of Present Illness: Aaron Weaver is a 65 y.o. male who was referred to me for evaluation of lymphadenopathy for an excisional lymph node biopsy. He first presented with a soft tissue mass near the left kidney, noted incidentally on a surveillance CT for his aortic stent, and for further workup underwent a PET/CT on 04/15/21. This showed hypermetabolic activity of a mass in the left renal hilum, as well as intraabdominal, intrathoracic, and right axillary lymph nodes. He has been referred for an excisional biopsy to obtain a tissue diagnosis.  Today he denies any fevers, chills, or night sweats.  He has not had any abdominal pain, has not noticed any swelling or lumps under his arms or in the groin.   He has a history of COPD and is on Spiriva but is not on home oxygen.  He reports he can climb a flight of stairs but develops mild dyspnea while doing this.  He takes a baby aspirin daily but is not on any anticoagulants or other antiplatelets.   Review of Systems: A complete review of systems was obtained from the patient.  I have reviewed this information and discussed as appropriate with the patient.  See HPI as well for other ROS.     Medical History: Past Medical History Past Medical History: Diagnosis Date  Anxiety    Arthritis    COPD (chronic obstructive pulmonary disease) (CMS-HCC)    Diabetes mellitus without complication (CMS-HCC)    GERD (gastroesophageal reflux disease)    Hyperlipidemia    Hypertension        Patient Active Problem List Diagnosis  Lymphadenopathy     Past Surgical History Past Surgical History: Procedure Laterality Date  ENDOVASCULAR REPAIR INFRARENAL ABDOMINAL AORTIC ANEURYSM   07/12/2020  HERNIA REPAIR      remove bones from hand N/A    stint surgery N/A        Allergies Allergies Allergen Reactions  Fexofenadine Other (See Comments)     "dries me out too much" "dries me out too much"    Hydrochlorothiazide Other (See Comments)      REACTION: cramps REACTION: cramps    Loratadine Other (See Comments)     sleepy sleepy    Penicillins Hives, Other (See Comments) and Unknown  Pravastatin Sodium Other (See Comments)     REACTION: aches REACTION: aches REACTION: aches    Pseudoephedrine Other (See Comments)     dry dry    Ropinirole Hcl Other (See Comments)     Bad dreams Bad dreams        Current Outpatient Medications on File Prior to Visit Medication Sig Dispense Refill  alendronate (FOSAMAX) 70 MG tablet Take by mouth      aspirin 81 MG EC tablet Take 1 tablet (81 mg total) by mouth once daily      atenoloL (TENORMIN) 100 MG tablet 1 tablet (100 mg total)      glimepiride (AMARYL) 2 MG tablet Take 1 tablet (2 mg total) by mouth 2 (two) times daily with meals      lancets (ONETOUCH DELICA PLUS LANCET)        omeprazole (PRILOSEC) 40 MG DR capsule 1 capsule (40 mg total)      potassium chloride (KLOR-CON) 20 MEQ ER tablet Take 1 tablet (20 mEq total) by mouth every morning      rosuvastatin (CRESTOR) 10 MG tablet 1 tablet (10 mg total)      tiotropium bromide (SPIRIVA RESPIMAT) 2.5 mcg/actuation inhalation spray Inhale  into the lungs      acetaminophen (TYLENOL) 325 MG tablet Take by mouth      calcium carbonate 600 mg calcium (1,500 mg) Tab tablet Take by mouth      DULoxetine (CYMBALTA) 60 MG DR capsule        losartan (COZAAR) 50 MG tablet 1 tablet (50 mg total)      PROAIR HFA 90 mcg/actuation inhaler INHALE 1 TO 2 PUFFS BY MOUTH EVERY 6 HOURS AS NEEDED FOR WHEEZING FOR SHORTNESS OF BREATH      tadalafiL (CIALIS) 5 MG tablet Take 1 tablet (5 mg total) by mouth once daily      tamsulosin (FLOMAX) 0.4 mg capsule         No current facility-administered medications on file prior to visit.     Family History Family History Problem Relation Age of Onset  Colon cancer Mother    High blood pressure (Hypertension) Father    Hyperlipidemia (Elevated cholesterol) Father    Diabetes Father         Social History   Tobacco Use Smoking Status Never Smokeless Tobacco Never     Social History Social History    Socioeconomic History  Marital status: Married Tobacco Use  Smoking status: Never  Smokeless tobacco: Never Vaping Use  Vaping Use: Never used Substance and Sexual Activity  Alcohol use: Never  Drug use: Never      Objective:     Vitals:   04/26/21 1118 BP: 104/82 Pulse: 75 Temp: 36.7 C (98 F) SpO2: 97% Weight: 91 kg (200 lb 9.6 oz) Height: 182.9 cm (6')   Body mass index is 27.21 kg/m.   Physical Exam Vitals reviewed.  Constitutional:      General: He is not in acute distress.    Appearance: Normal appearance.  HENT:     Head: Normocephalic and atraumatic.  Eyes:     General: No scleral icterus.    Conjunctiva/sclera: Conjunctivae normal.  Neck:     Comments: No palpable cervical or supraclavicular adenopathy. Cardiovascular:     Rate and Rhythm: Normal rate and regular rhythm.     Heart sounds: No murmur heard. Pulmonary:     Effort: Pulmonary effort is normal. No respiratory distress.     Breath sounds: Normal breath sounds. No stridor. No wheezing.  Musculoskeletal:        General: No swelling or deformity. Normal range of motion.     Cervical back: Normal range of motion.     Comments: No palpable bilateral axillary adenopathy.  Lymphadenopathy:     Cervical: No cervical adenopathy.  Skin:    General: Skin is warm and dry.     Coloration: Skin is not jaundiced.  Neurological:     General: No focal deficit present.     Mental Status: He is alert and oriented to person, place, and time.  Psychiatric:        Mood and Affect: Mood normal.        Behavior: Behavior normal.        Thought Content: Thought content normal.            Labs, Imaging and Diagnostic Testing: PET/CT 04/15/21: IMPRESSION: 1. Hypermetabolic soft tissue density lesions and nodes, including within the right-side of the face, right axilla, left pleura,  left renal hilum, and pelvis, most consistent with a lymphoproliferative process. (Deauville) 5. 2. 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 3. Incidental findings, including: Aortic atherosclerosis   Assessment and Plan:  Diagnoses and all orders for this visit:   Lymphadenopathy       This is a 65 year old male presenting with diffuse lymphadenopathy with PET avidity concerning for lymphoproliferative disease.  He has been referred by oncology for an excisional lymph node biopsy.  He does not have any palpable adenopathy on exam, however on review of his PET, he appears to have mildly enlarged right axillary lymph nodes with FDG uptake in this area.  These nodes would be most amenable to biopsy.  He is already scheduled for surgery for tomorrow, with a planned right axillary excisional lymph node biopsy.  I discussed the procedure details with him and he agrees to proceed.  He stopped taking his baby aspirin last week in anticipation of surgery, however I informed him that this does not need to be held for surgery and he may continue taking it.  All questions were answered.  Michaelle Birks, MD [facility] Surgery General, Hepatobiliary and Pancreatic Surgery 04/26/21 11:39 AM

## 2021-04-26 NOTE — H&P (View-Only) (Signed)
History of Present Illness: Aaron Weaver is a 65 y.o. male who was referred to me for evaluation of lymphadenopathy for an excisional lymph node biopsy. He first presented with a soft tissue mass near the left kidney, noted incidentally on a surveillance CT for his aortic stent, and for further workup underwent a PET/CT on 04/15/21. This showed hypermetabolic activity of a mass in the left renal hilum, as well as intraabdominal, intrathoracic, and right axillary lymph nodes. He has been referred for an excisional biopsy to obtain a tissue diagnosis.  Today he denies any fevers, chills, or night sweats.  He has not had any abdominal pain, has not noticed any swelling or lumps under his arms or in the groin.   He has a history of COPD and is on Spiriva but is not on home oxygen.  He reports he can climb a flight of stairs but develops mild dyspnea while doing this.  He takes a baby aspirin daily but is not on any anticoagulants or other antiplatelets.   Review of Systems: A complete review of systems was obtained from the patient.  I have reviewed this information and discussed as appropriate with the patient.  See HPI as well for other ROS.     Medical History: Past Medical History Past Medical History: Diagnosis Date  Anxiety    Arthritis    COPD (chronic obstructive pulmonary disease) (CMS-HCC)    Diabetes mellitus without complication (CMS-HCC)    GERD (gastroesophageal reflux disease)    Hyperlipidemia    Hypertension        Patient Active Problem List Diagnosis  Lymphadenopathy     Past Surgical History Past Surgical History: Procedure Laterality Date  ENDOVASCULAR REPAIR INFRARENAL ABDOMINAL AORTIC ANEURYSM   07/12/2020  HERNIA REPAIR      remove bones from hand N/A    stint surgery N/A        Allergies Allergies Allergen Reactions  Fexofenadine Other (See Comments)     "dries me out too much" "dries me out too much"    Hydrochlorothiazide Other (See Comments)      REACTION: cramps REACTION: cramps    Loratadine Other (See Comments)     sleepy sleepy    Penicillins Hives, Other (See Comments) and Unknown  Pravastatin Sodium Other (See Comments)     REACTION: aches REACTION: aches REACTION: aches    Pseudoephedrine Other (See Comments)     dry dry    Ropinirole Hcl Other (See Comments)     Bad dreams Bad dreams        Current Outpatient Medications on File Prior to Visit Medication Sig Dispense Refill  alendronate (FOSAMAX) 70 MG tablet Take by mouth      aspirin 81 MG EC tablet Take 1 tablet (81 mg total) by mouth once daily      atenoloL (TENORMIN) 100 MG tablet 1 tablet (100 mg total)      glimepiride (AMARYL) 2 MG tablet Take 1 tablet (2 mg total) by mouth 2 (two) times daily with meals      lancets (ONETOUCH DELICA PLUS LANCET)        omeprazole (PRILOSEC) 40 MG DR capsule 1 capsule (40 mg total)      potassium chloride (KLOR-CON) 20 MEQ ER tablet Take 1 tablet (20 mEq total) by mouth every morning      rosuvastatin (CRESTOR) 10 MG tablet 1 tablet (10 mg total)      tiotropium bromide (SPIRIVA RESPIMAT) 2.5 mcg/actuation inhalation spray Inhale  into the lungs      acetaminophen (TYLENOL) 325 MG tablet Take by mouth      calcium carbonate 600 mg calcium (1,500 mg) Tab tablet Take by mouth      DULoxetine (CYMBALTA) 60 MG DR capsule        losartan (COZAAR) 50 MG tablet 1 tablet (50 mg total)      PROAIR HFA 90 mcg/actuation inhaler INHALE 1 TO 2 PUFFS BY MOUTH EVERY 6 HOURS AS NEEDED FOR WHEEZING FOR SHORTNESS OF BREATH      tadalafiL (CIALIS) 5 MG tablet Take 1 tablet (5 mg total) by mouth once daily      tamsulosin (FLOMAX) 0.4 mg capsule         No current facility-administered medications on file prior to visit.     Family History Family History Problem Relation Age of Onset  Colon cancer Mother    High blood pressure (Hypertension) Father    Hyperlipidemia (Elevated cholesterol) Father    Diabetes Father         Social History   Tobacco Use Smoking Status Never Smokeless Tobacco Never     Social History Social History    Socioeconomic History  Marital status: Married Tobacco Use  Smoking status: Never  Smokeless tobacco: Never Vaping Use  Vaping Use: Never used Substance and Sexual Activity  Alcohol use: Never  Drug use: Never      Objective:     Vitals:   04/26/21 1118 BP: 104/82 Pulse: 75 Temp: 36.7 C (98 F) SpO2: 97% Weight: 91 kg (200 lb 9.6 oz) Height: 182.9 cm (6')   Body mass index is 27.21 kg/m.   Physical Exam Vitals reviewed.  Constitutional:      General: He is not in acute distress.    Appearance: Normal appearance.  HENT:     Head: Normocephalic and atraumatic.  Eyes:     General: No scleral icterus.    Conjunctiva/sclera: Conjunctivae normal.  Neck:     Comments: No palpable cervical or supraclavicular adenopathy. Cardiovascular:     Rate and Rhythm: Normal rate and regular rhythm.     Heart sounds: No murmur heard. Pulmonary:     Effort: Pulmonary effort is normal. No respiratory distress.     Breath sounds: Normal breath sounds. No stridor. No wheezing.  Musculoskeletal:        General: No swelling or deformity. Normal range of motion.     Cervical back: Normal range of motion.     Comments: No palpable bilateral axillary adenopathy.  Lymphadenopathy:     Cervical: No cervical adenopathy.  Skin:    General: Skin is warm and dry.     Coloration: Skin is not jaundiced.  Neurological:     General: No focal deficit present.     Mental Status: He is alert and oriented to person, place, and time.  Psychiatric:        Mood and Affect: Mood normal.        Behavior: Behavior normal.        Thought Content: Thought content normal.            Labs, Imaging and Diagnostic Testing: PET/CT 04/15/21: IMPRESSION: 1. Hypermetabolic soft tissue density lesions and nodes, including within the right-side of the face, right axilla, left pleura,  left renal hilum, and pelvis, most consistent with a lymphoproliferative process. (Deauville) 5. 2. 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 3. Incidental findings, including: Aortic atherosclerosis   Assessment and Plan:  Diagnoses and all orders for this visit:   Lymphadenopathy       This is a 65 year old male presenting with diffuse lymphadenopathy with PET avidity concerning for lymphoproliferative disease.  He has been referred by oncology for an excisional lymph node biopsy.  He does not have any palpable adenopathy on exam, however on review of his PET, he appears to have mildly enlarged right axillary lymph nodes with FDG uptake in this area.  These nodes would be most amenable to biopsy.  He is already scheduled for surgery for tomorrow, with a planned right axillary excisional lymph node biopsy.  I discussed the procedure details with him and he agrees to proceed.  He stopped taking his baby aspirin last week in anticipation of surgery, however I informed him that this does not need to be held for surgery and he may continue taking it.  All questions were answered.  Michaelle Birks, MD [facility] Surgery General, Hepatobiliary and Pancreatic Surgery 04/26/21 11:39 AM

## 2021-04-27 ENCOUNTER — Ambulatory Visit (HOSPITAL_BASED_OUTPATIENT_CLINIC_OR_DEPARTMENT_OTHER): Payer: Medicare Other | Admitting: Anesthesiology

## 2021-04-27 ENCOUNTER — Encounter (HOSPITAL_BASED_OUTPATIENT_CLINIC_OR_DEPARTMENT_OTHER): Payer: Self-pay | Admitting: Surgery

## 2021-04-27 ENCOUNTER — Ambulatory Visit (HOSPITAL_BASED_OUTPATIENT_CLINIC_OR_DEPARTMENT_OTHER)
Admission: RE | Admit: 2021-04-27 | Discharge: 2021-04-27 | Disposition: A | Discharge status: Home / Self Care | Payer: Medicare Other | Source: Ambulatory Visit | Attending: Surgery | Admitting: Surgery

## 2021-04-27 ENCOUNTER — Encounter (HOSPITAL_BASED_OUTPATIENT_CLINIC_OR_DEPARTMENT_OTHER)
Admission: RE | Disposition: A | Discharge status: Home / Self Care | Payer: Self-pay | Source: Ambulatory Visit | Attending: Surgery

## 2021-04-27 DIAGNOSIS — R590 Localized enlarged lymph nodes: Secondary | ICD-10-CM | POA: Diagnosis not present

## 2021-04-27 DIAGNOSIS — C8330 Diffuse large B-cell lymphoma, unspecified site: Secondary | ICD-10-CM | POA: Diagnosis not present

## 2021-04-27 DIAGNOSIS — C8590 Non-Hodgkin lymphoma, unspecified, unspecified site: Secondary | ICD-10-CM | POA: Diagnosis not present

## 2021-04-27 DIAGNOSIS — I10 Essential (primary) hypertension: Secondary | ICD-10-CM

## 2021-04-27 DIAGNOSIS — C8334 Diffuse large B-cell lymphoma, lymph nodes of axilla and upper limb: Secondary | ICD-10-CM | POA: Diagnosis not present

## 2021-04-27 DIAGNOSIS — C8331 Diffuse large B-cell lymphoma, lymph nodes of head, face, and neck: Secondary | ICD-10-CM | POA: Diagnosis not present

## 2021-04-27 HISTORY — PX: AXILLARY LYMPH NODE BIOPSY: SHX5737

## 2021-04-27 LAB — GLUCOSE, CAPILLARY
Glucose-Capillary: 269 mg/dL — ABNORMAL HIGH (ref 70–99)
Glucose-Capillary: 244 mg/dL — ABNORMAL HIGH (ref 70–99)

## 2021-04-27 SURGERY — AXILLARY LYMPH NODE BIOPSY
Anesthesia: General | Site: Axilla | Laterality: Right

## 2021-04-27 MED ORDER — PHENYLEPHRINE HCL (PRESSORS) 10 MG/ML IV SOLN
INTRAVENOUS | Status: AC
  Filled 2021-04-27: qty 1

## 2021-04-27 MED ORDER — LACTATED RINGERS IV SOLN: INTRAVENOUS | Status: DC

## 2021-04-27 MED ORDER — ACETAMINOPHEN 500 MG PO TABS: 1000.0000 mg | ORAL_TABLET | Freq: Once | ORAL | Status: DC

## 2021-04-27 MED ORDER — DEXAMETHASONE SODIUM PHOSPHATE 10 MG/ML IJ SOLN
INTRAMUSCULAR | Status: AC
  Filled 2021-04-27: qty 1

## 2021-04-27 MED ORDER — CEFAZOLIN SODIUM-DEXTROSE 2-4 GM/100ML-% IV SOLN
INTRAVENOUS | Status: AC
  Filled 2021-04-27: qty 100

## 2021-04-27 MED ORDER — FENTANYL CITRATE (PF) 100 MCG/2ML IJ SOLN
INTRAMUSCULAR | Status: AC
  Filled 2021-04-27: qty 2

## 2021-04-27 MED ORDER — TRAMADOL HCL 50 MG PO TABS: 50.0000 mg | ORAL_TABLET | Freq: Four times a day (QID) | ORAL | 0 refills | Status: AC | PRN

## 2021-04-27 MED ORDER — PHENYLEPHRINE HCL-NACL 20-0.9 MG/250ML-% IV SOLN
INTRAVENOUS | Status: DC | PRN
  Administered 2021-04-27: 100 ug/min via INTRAVENOUS

## 2021-04-27 MED ORDER — 0.9 % SODIUM CHLORIDE (POUR BTL) OPTIME
TOPICAL | Status: DC | PRN
  Administered 2021-04-27: 11:00:00 60 mL

## 2021-04-27 MED ORDER — PHENYLEPHRINE HCL (PRESSORS) 10 MG/ML IV SOLN
INTRAVENOUS | Status: DC | PRN
  Administered 2021-04-27: 120 ug via INTRAVENOUS
  Administered 2021-04-27: 160 ug via INTRAVENOUS
  Administered 2021-04-27: 120 ug via INTRAVENOUS

## 2021-04-27 MED ORDER — FENTANYL CITRATE (PF) 100 MCG/2ML IJ SOLN
INTRAMUSCULAR | Status: DC | PRN
  Administered 2021-04-27: 25 ug via INTRAVENOUS
  Administered 2021-04-27: 100 ug via INTRAVENOUS

## 2021-04-27 MED ORDER — BUPIVACAINE-EPINEPHRINE (PF) 0.25% -1:200000 IJ SOLN
INTRAMUSCULAR | Status: DC | PRN
  Administered 2021-04-27: 20 mL

## 2021-04-27 MED ORDER — OXYCODONE HCL 5 MG/5ML PO SOLN: 5.0000 mg | Freq: Once | ORAL | Status: DC | PRN

## 2021-04-27 MED ORDER — ONDANSETRON HCL 4 MG/2ML IJ SOLN
INTRAMUSCULAR | Status: AC
  Filled 2021-04-27: qty 2

## 2021-04-27 MED ORDER — EPHEDRINE SULFATE 50 MG/ML IJ SOLN
INTRAMUSCULAR | Status: DC | PRN
  Administered 2021-04-27: 15 mg via INTRAVENOUS
  Administered 2021-04-27: 10 mg via INTRAVENOUS

## 2021-04-27 MED ORDER — DEXAMETHASONE SODIUM PHOSPHATE 10 MG/ML IJ SOLN
INTRAMUSCULAR | Status: DC | PRN
  Administered 2021-04-27: 4 mg via INTRAVENOUS

## 2021-04-27 MED ORDER — LIDOCAINE 2% (20 MG/ML) 5 ML SYRINGE
INTRAMUSCULAR | Status: AC
  Filled 2021-04-27: qty 5

## 2021-04-27 MED ORDER — LIDOCAINE HCL (CARDIAC) PF 100 MG/5ML IV SOSY
PREFILLED_SYRINGE | INTRAVENOUS | Status: DC | PRN
  Administered 2021-04-27: 100 mg via INTRATRACHEAL

## 2021-04-27 MED ORDER — MIDAZOLAM HCL 2 MG/2ML IJ SOLN
INTRAMUSCULAR | Status: AC
  Filled 2021-04-27: qty 2

## 2021-04-27 MED ORDER — PROPOFOL 10 MG/ML IV BOLUS
INTRAVENOUS | Status: AC
  Filled 2021-04-27: qty 20

## 2021-04-27 MED ORDER — PROPOFOL 10 MG/ML IV BOLUS
INTRAVENOUS | Status: DC | PRN
  Administered 2021-04-27: 180 mg via INTRAVENOUS
  Administered 2021-04-27: 20 mg via INTRAVENOUS

## 2021-04-27 MED ORDER — EPHEDRINE 5 MG/ML INJ
INTRAVENOUS | Status: AC
  Filled 2021-04-27: qty 5

## 2021-04-27 MED ORDER — BUPIVACAINE HCL (PF) 0.25 % IJ SOLN
INTRAMUSCULAR | Status: AC
  Filled 2021-04-27: qty 30

## 2021-04-27 MED ORDER — BUPIVACAINE-EPINEPHRINE (PF) 0.25% -1:200000 IJ SOLN
INTRAMUSCULAR | Status: AC
  Filled 2021-04-27: qty 30

## 2021-04-27 MED ORDER — ONDANSETRON HCL 4 MG/2ML IJ SOLN
INTRAMUSCULAR | Status: DC | PRN
  Administered 2021-04-27: 4 mg via INTRAVENOUS

## 2021-04-27 MED ORDER — CEFAZOLIN SODIUM-DEXTROSE 2-4 GM/100ML-% IV SOLN
2.0000 g | INTRAVENOUS | Status: AC
  Administered 2021-04-27: 10:00:00 2 g via INTRAVENOUS

## 2021-04-27 MED ORDER — MIDAZOLAM HCL 5 MG/5ML IJ SOLN
INTRAMUSCULAR | Status: DC | PRN
Start: 2021-04-27 — End: 2021-04-27
  Administered 2021-04-27: 2 mg via INTRAVENOUS

## 2021-04-27 MED ORDER — OXYCODONE HCL 5 MG PO TABS: 5.0000 mg | ORAL_TABLET | Freq: Once | ORAL | Status: DC | PRN

## 2021-04-27 MED ORDER — SILVER NITRATE-POT NITRATE 75-25 % EX MISC
CUTANEOUS | Status: AC
  Filled 2021-04-27: qty 10

## 2021-04-27 MED ORDER — PHENYLEPHRINE 40 MCG/ML (10ML) SYRINGE FOR IV PUSH (FOR BLOOD PRESSURE SUPPORT)
PREFILLED_SYRINGE | INTRAVENOUS | Status: AC
  Filled 2021-04-27: qty 20

## 2021-04-27 MED ORDER — FENTANYL CITRATE (PF) 100 MCG/2ML IJ SOLN: 25.0000 ug | INTRAMUSCULAR | Status: DC | PRN

## 2021-04-27 SURGICAL SUPPLY — 37 items
ADH SKN CLS APL DERMABOND .7 (GAUZE/BANDAGES/DRESSINGS) ×1
APL PRP STRL LF DISP 70% ISPRP (MISCELLANEOUS) ×1
APPLIER CLIP 9.375 MED OPEN (MISCELLANEOUS) ×3
APR CLP MED 9.3 20 MLT OPN (MISCELLANEOUS) ×1
BLADE SURG 15 STRL LF DISP TIS (BLADE) ×2 IMPLANT
BLADE SURG 15 STRL SS (BLADE) ×3
CANISTER SUCT 1200ML W/VALVE (MISCELLANEOUS) ×2 IMPLANT
CHLORAPREP W/TINT 26 (MISCELLANEOUS) ×4 IMPLANT
CLIP APPLIE 9.375 MED OPEN (MISCELLANEOUS) IMPLANT
COVER BACK TABLE 60X90IN (DRAPES) ×4 IMPLANT
COVER MAYO STAND STRL (DRAPES) ×4 IMPLANT
DERMABOND ADVANCED (GAUZE/BANDAGES/DRESSINGS) ×2
DERMABOND ADVANCED .7 DNX12 (GAUZE/BANDAGES/DRESSINGS) IMPLANT
DRAPE LAPAROTOMY 100X72 PEDS (DRAPES) ×4 IMPLANT
DRAPE UTILITY XL STRL (DRAPES) ×4 IMPLANT
ELECT REM PT RETURN 9FT ADLT (ELECTROSURGICAL) ×3
ELECTRODE REM PT RTRN 9FT ADLT (ELECTROSURGICAL) ×2 IMPLANT
GLOVE SURG POLY MICRO LF SZ5.5 (GLOVE) ×4 IMPLANT
GLOVE SURG POLYISO LF SZ6.5 (GLOVE) ×2 IMPLANT
GLOVE SURG UNDER POLY LF SZ6 (GLOVE) ×4 IMPLANT
GLOVE SURG UNDER POLY LF SZ7 (GLOVE) ×2 IMPLANT
GOWN STRL REUS W/ TWL LRG LVL3 (GOWN DISPOSABLE) ×4 IMPLANT
GOWN STRL REUS W/TWL LRG LVL3 (GOWN DISPOSABLE) ×6
NDL HYPO 25X1 1.5 SAFETY (NEEDLE) ×2 IMPLANT
NEEDLE HYPO 25X1 1.5 SAFETY (NEEDLE) ×3 IMPLANT
PACK BASIN DAY SURGERY FS (CUSTOM PROCEDURE TRAY) ×4 IMPLANT
PENCIL SMOKE EVACUATOR (MISCELLANEOUS) ×4 IMPLANT
SLEEVE SCD COMPRESS KNEE MED (STOCKING) ×4 IMPLANT
SPONGE T-LAP 4X18 ~~LOC~~+RFID (SPONGE) ×4 IMPLANT
SUT MON AB 4-0 PC3 18 (SUTURE) ×4 IMPLANT
SUT VIC AB 3-0 SH 27 (SUTURE) ×3
SUT VIC AB 3-0 SH 27X BRD (SUTURE) ×2 IMPLANT
SYR CONTROL 10ML LL (SYRINGE) ×4 IMPLANT
TOWEL GREEN STERILE FF (TOWEL DISPOSABLE) ×4 IMPLANT
TUBE CONNECTING 20'X1/4 (TUBING) ×1
TUBE CONNECTING 20X1/4 (TUBING) ×1 IMPLANT
YANKAUER SUCT BULB TIP NO VENT (SUCTIONS) ×2 IMPLANT

## 2021-04-27 NOTE — Interval H&P Note (Signed)
History and Physical Interval Note:  04/27/2021 8:44 AM  Aaron Fee Sr.  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  The various methods of treatment have been discussed with the patient and family. After consideration of risks, benefits and other options for treatment, the patient has consented to  Procedure(s): AXILLARY EXCISIONAL LYMPH NODE BIOPSY (N/A) as a surgical intervention.  The patient's history has been reviewed, patient examined, no change in status, stable for surgery.  I have reviewed the patient's chart and labs.  Questions were answered to the patient's satisfaction.  Surgical site confirmed and marked.   Dwan Bolt

## 2021-04-27 NOTE — Anesthesia Preprocedure Evaluation (Addendum)
Anesthesia Evaluation  Patient identified by MRN, date of birth, ID band Patient awake    Reviewed: Allergy & Precautions, NPO status , Patient's Chart, lab work & pertinent test results, reviewed documented beta blocker date and time   History of Anesthesia Complications Negative for: history of anesthetic complications  Airway Mallampati: II  TM Distance: >3 FB Neck ROM: Full    Dental  (+) Edentulous Upper, Edentulous Lower   Pulmonary COPD,  COPD inhaler, former smoker,    Pulmonary exam normal        Cardiovascular hypertension, Pt. on medications and Pt. on home beta blockers Normal cardiovascular exam+ dysrhythmias      Neuro/Psych Anxiety Depression negative neurological ROS     GI/Hepatic Neg liver ROS, GERD  Medicated and Controlled,  Endo/Other  diabetes, Type 2, Oral Hypoglycemic Agents  Renal/GU negative Renal ROS  negative genitourinary   Musculoskeletal negative musculoskeletal ROS (+)   Abdominal   Peds  Hematology negative hematology ROS (+)   Anesthesia Other Findings Lymphadenopathy concerning for lymphoma  Reproductive/Obstetrics negative OB ROS                            Anesthesia Physical Anesthesia Plan  ASA: 2  Anesthesia Plan: General   Post-op Pain Management: Tylenol PO (pre-op)   Induction: Intravenous  PONV Risk Score and Plan: 2 and Treatment may vary due to age or medical condition, Ondansetron, Dexamethasone and Midazolam  Airway Management Planned: LMA  Additional Equipment: None  Intra-op Plan:   Post-operative Plan: Extubation in OR  Informed Consent: I have reviewed the patients History and Physical, chart, labs and discussed the procedure including the risks, benefits and alternatives for the proposed anesthesia with the patient or authorized representative who has indicated his/her understanding and acceptance.     Dental advisory  given  Plan Discussed with: CRNA  Anesthesia Plan Comments:        Anesthesia Quick Evaluation

## 2021-04-27 NOTE — Anesthesia Procedure Notes (Signed)
Procedure Name: LMA Insertion Date/Time: 04/27/2021 9:57 AM Performed by: Glory Buff, CRNA Pre-anesthesia Checklist: Patient identified, Emergency Drugs available, Suction available and Patient being monitored Patient Re-evaluated:Patient Re-evaluated prior to induction Oxygen Delivery Method: Circle system utilized Preoxygenation: Pre-oxygenation with 100% oxygen Induction Type: IV induction LMA: LMA with gastric port inserted LMA Size: 4.0 Number of attempts: 1 Placement Confirmation: positive ETCO2 Tube secured with: Tape Dental Injury: Teeth and Oropharynx as per pre-operative assessment

## 2021-04-27 NOTE — Anesthesia Postprocedure Evaluation (Signed)
Anesthesia Post Note  Patient: Aaron NARINE Sr.  Procedure(s) Performed: AXILLARY EXCISIONAL LYMPH NODE BIOPSY (Right: Axilla)     Patient location during evaluation: PACU Anesthesia Type: General Level of consciousness: awake and alert and oriented Pain management: pain level controlled Vital Signs Assessment: post-procedure vital signs reviewed and stable Respiratory status: spontaneous breathing, nonlabored ventilation and respiratory function stable Cardiovascular status: blood pressure returned to baseline Postop Assessment: no apparent nausea or vomiting Anesthetic complications: no   No notable events documented.  Last Vitals:  Vitals:   04/27/21 1145 04/27/21 1200  BP: 104/74 (!) 97/59  Pulse: 73 74  Resp: 15 16  Temp:  36.5 C  SpO2: 96% 95%    Last Pain:  Vitals:   04/27/21 1200  TempSrc:   PainSc: 0-No pain                 Marthenia Rolling

## 2021-04-27 NOTE — Transfer of Care (Signed)
Immediate Anesthesia Transfer of Care Note  Patient: Aaron Fee Sr.  Procedure(s) Performed: AXILLARY EXCISIONAL LYMPH NODE BIOPSY (Right: Axilla)  Patient Location: PACU  Anesthesia Type:General  Level of Consciousness: sedated  Airway & Oxygen Therapy: Patient Spontanous Breathing and Patient connected to face mask oxygen  Post-op Assessment: Report given to RN and Post -op Vital signs reviewed and stable  Post vital signs: Reviewed and stable  Last Vitals:  Vitals Value Taken Time  BP 119/69 04/27/21 1118  Temp    Pulse 76 04/27/21 1119  Resp 16 04/27/21 1119  SpO2 99 % 04/27/21 1119  Vitals shown include unvalidated device data.  Last Pain:  Vitals:   04/27/21 0844  TempSrc: Oral  PainSc: 0-No pain         Complications: No notable events documented.

## 2021-04-27 NOTE — Op Note (Signed)
Date: 04/27/21  Patient: Aaron COPELAN Sr. MRN: [id number]  Preoperative Diagnosis: Lymphadenopathy Postoperative Diagnosis: Same  Procedure: Right axillary excisional lymph node biopsy  Surgeon: Michaelle Birks, MD  EBL: Minimal  Anesthesia: General LMA  Specimens: Right axillary lymph nodes  Indications: Aaron Weaver is a 65 yo male who presented with diffuse lymphadenopathy with hypermetabolic activity noted on PET. He was referred by oncology for an excisional lymph node biopsy.  Findings: Abnormally enlarged and firm lymph nodes in the right axilla.  Procedure details: Informed consent was obtained in the preoperative area prior to the procedure. The patient was brought to the operating room and placed on the table in the supine position. General anesthesia was induced and appropriate lines and drains were placed for intraoperative monitoring. Perioperative antibiotics were administered per SCIP guidelines. The right axilla was prepped and draped in the usual sterile fashion. A pre-procedure timeout was taken verifying patient identity, surgical site and procedure to be performed.  A curvilinear skin incision was made overlying the right axilla.  The subcutaneous tissue was divided with cautery, and the clavipectoral fascia was opened.  The axillary fat was palpated.  In the more superficial axillary fat there were no abnormally enlarged lymph nodes that were either visible or palpable.  The inferior border of the axillary vein was visualized and there was no adjacent adenopathy.  Deeper within the axilla a firm enlarged node was palpated adjacent to the chest wall.  This lymph node packet was grasped with an Allis clamp, and dissected out using cautery and blunt dissection.  Small lymphatic vessels and veins were clipped prior to ligation.  Once the node was dissected out was examined and appeared to be a cluster of firm and abnormally enlarged lymph nodes.  These lymph nodes were sent to  pathology fresh for lymphoma work-up.  The wound was irrigated and hemostasis was achieved with cautery.  The clavipectoral fascia was closed with a running 3-0 Vicryl suture.  The deep dermal layer was closed with a running 3-0 Vicryl suture, and the skin was closed with running subcuticular 4 Monocryl suture.  Dermabond was applied.  The patient tolerated the procedure well with no apparent complications.  All counts were correct x2 at the end of the procedure. The patient was extubated and taken to PACU in stable condition.  Michaelle Birks, MD 04/27/21 11:12 AM

## 2021-04-27 NOTE — Discharge Instructions (Addendum)
[facility] DISCHARGE INSTRUCTIONS  Activity No heavy lifting greater than 10 pounds with the right arm for 2 weeks after surgery. Ok to shower in 24 hours, but do not bathe or submerge incision underwater. Do not drive while taking narcotic pain medication.  Wound Care Your incisions are covered with skin glue called Dermabond. This will peel off on its own over time. You may shower in 24 hours and allow warm soapy water to run over your incisions. Gently pat dry. Do not submerge your incision underwater. Monitor your incision for any new redness, tenderness, or drainage.  When to Call us: Fever greater than 100.5 New redness, drainage, or swelling at incision site Severe pain, nausea, or vomiting  Follow-up You have an appointment scheduled with Dr. Zenia Resides on May 18, 2021 at 9:30am. This will be at the [facility] office at [address] [place], [place], [place]. Please arrive at least 15 minutes prior to your scheduled appointment time.  For questions or concerns, please call the office at [phone].    Post Anesthesia Home Care Instructions  Activity: Get plenty of rest for the remainder of the day. A responsible individual must stay with you for 24 hours following the procedure.  For the next 24 hours, DO NOT: -Drive a car -Paediatric nurse -Drink alcoholic beverages -Take any medication unless instructed by your physician -Make any legal decisions or sign important papers.  Meals: Start with liquid foods such as gelatin or soup. Progress to regular foods as tolerated. Avoid greasy, spicy, heavy foods. If nausea and/or vomiting occur, drink only clear liquids until the nausea and/or vomiting subsides. Call your physician if vomiting continues.  Special Instructions/Symptoms: Your throat may feel dry or sore from the anesthesia or the breathing tube placed in your throat during surgery. If this causes discomfort, gargle with warm  salt water. The discomfort should disappear within 24 hours.  If you had a scopolamine patch placed behind your ear for the management of post- operative nausea and/or vomiting:  1. The medication in the patch is effective for 72 hours, after which it should be removed.  Wrap patch in a tissue and discard in the trash. Wash hands thoroughly with soap and water. 2. You may remove the patch earlier than 72 hours if you experience unpleasant side effects which may include dry mouth, dizziness or visual disturbances. 3. Avoid touching the patch. Wash your hands with soap and water after contact with the patch.

## 2021-04-28 ENCOUNTER — Encounter (HOSPITAL_BASED_OUTPATIENT_CLINIC_OR_DEPARTMENT_OTHER): Payer: Self-pay | Admitting: Surgery

## 2021-04-29 LAB — SURGICAL PATHOLOGY

## 2021-05-03 ENCOUNTER — Encounter: Payer: Self-pay | Admitting: *Deleted

## 2021-05-03 NOTE — Progress Notes (Signed)
Patient was seen at our main [facility] [place] for workup, but wanted to be seen at this location if cancer diagnosis was made. Patient's work up/biopsy revealed DLBCL.   Reached out to [place]. to introduce myself as the office RN Navigator and explain our new patient process. He requested that I speak to his wife, Aaron Weaver. Reviewed the reason for their referral and scheduled their new patient appointment along with labs. Provided address and directions to the office including call back phone number. Reviewed with patient any concerns they may have or any possible barriers to attending their appointment.    Oncology Nurse Navigator Documentation  Oncology Nurse Navigator Flowsheets 05/03/2021  Abnormal Finding Date 02/25/2021  Confirmed Diagnosis Date 04/27/2021  Diagnosis Status Confirmed Diagnosis Complete  Navigator Follow Up Date: 05/04/2021  Navigator Follow Up Reason: New Patient Appointment  Navigator Location [facility]  Navigator Encounter Type Introductory Phone Call  Patient Visit Type MedOnc  Treatment Phase Pre-Tx/Tx Discussion  Barriers/Navigation Needs Coordination of Care;Education  Education Other  Interventions Coordination of Care;Education  Acuity Level 2-Minimal Needs (1-2 Barriers Identified)  Coordination of Care Appts  Education Method Verbal  Support Groups/Services Friends and Family  Time Spent with Patient 42

## 2021-05-04 ENCOUNTER — Other Ambulatory Visit: Payer: Self-pay

## 2021-05-04 ENCOUNTER — Encounter: Payer: Self-pay | Admitting: *Deleted

## 2021-05-04 ENCOUNTER — Inpatient Hospital Stay: Payer: Medicare Other | Attending: Physician Assistant

## 2021-05-04 ENCOUNTER — Inpatient Hospital Stay: Payer: Medicare Other | Admitting: Hematology & Oncology

## 2021-05-04 ENCOUNTER — Encounter: Payer: Self-pay | Admitting: Hematology & Oncology

## 2021-05-04 VITALS — BP 94/53 | HR 71 | Temp 98.2°F | Resp 21 | Wt 198.0 lb

## 2021-05-04 DIAGNOSIS — J439 Emphysema, unspecified: Secondary | ICD-10-CM | POA: Diagnosis not present

## 2021-05-04 DIAGNOSIS — I714 Abdominal aortic aneurysm, without rupture, unspecified: Secondary | ICD-10-CM | POA: Insufficient documentation

## 2021-05-04 DIAGNOSIS — C8338 Diffuse large B-cell lymphoma, lymph nodes of multiple sites: Secondary | ICD-10-CM | POA: Diagnosis not present

## 2021-05-04 DIAGNOSIS — E1169 Type 2 diabetes mellitus with other specified complication: Secondary | ICD-10-CM | POA: Diagnosis not present

## 2021-05-04 DIAGNOSIS — Z7189 Other specified counseling: Secondary | ICD-10-CM | POA: Insufficient documentation

## 2021-05-04 DIAGNOSIS — E7800 Pure hypercholesterolemia, unspecified: Secondary | ICD-10-CM | POA: Diagnosis not present

## 2021-05-04 DIAGNOSIS — K219 Gastro-esophageal reflux disease without esophagitis: Secondary | ICD-10-CM | POA: Diagnosis not present

## 2021-05-04 DIAGNOSIS — C8330 Diffuse large B-cell lymphoma, unspecified site: Secondary | ICD-10-CM | POA: Insufficient documentation

## 2021-05-04 DIAGNOSIS — Z87891 Personal history of nicotine dependence: Secondary | ICD-10-CM | POA: Insufficient documentation

## 2021-05-04 DIAGNOSIS — M8580 Other specified disorders of bone density and structure, unspecified site: Secondary | ICD-10-CM | POA: Diagnosis not present

## 2021-05-04 DIAGNOSIS — I10 Essential (primary) hypertension: Secondary | ICD-10-CM | POA: Diagnosis not present

## 2021-05-04 DIAGNOSIS — N2889 Other specified disorders of kidney and ureter: Secondary | ICD-10-CM | POA: Diagnosis present

## 2021-05-04 HISTORY — DX: Diffuse large b-cell lymphoma, lymph nodes of multiple sites: C83.38

## 2021-05-04 LAB — CBC WITH DIFFERENTIAL (CANCER CENTER ONLY)
Abs Immature Granulocytes: 0.05 10*3/uL (ref 0.00–0.07)
Basophils Absolute: 0 10*3/uL (ref 0.0–0.1)
Basophils Relative: 0 %
Eosinophils Absolute: 0 10*3/uL (ref 0.0–0.5)
Eosinophils Relative: 1 %
HCT: 42.8 % (ref 39.0–52.0)
Hemoglobin: 14.3 g/dL (ref 13.0–17.0)
Immature Granulocytes: 1 %
Lymphocytes Relative: 20 %
Lymphs Abs: 1 10*3/uL (ref 0.7–4.0)
MCH: 29.2 pg (ref 26.0–34.0)
MCHC: 33.4 g/dL (ref 30.0–36.0)
MCV: 87.3 fL (ref 80.0–100.0)
Monocytes Absolute: 0.6 10*3/uL (ref 0.1–1.0)
Monocytes Relative: 12 %
Neutro Abs: 3.2 10*3/uL (ref 1.7–7.7)
Neutrophils Relative %: 66 %
Platelet Count: 127 10*3/uL — ABNORMAL LOW (ref 150–400)
RBC: 4.9 MIL/uL (ref 4.22–5.81)
RDW: 13.2 % (ref 11.5–15.5)
WBC Count: 4.9 10*3/uL (ref 4.0–10.5)
nRBC: 0 % (ref 0.0–0.2)

## 2021-05-04 LAB — CMP (CANCER CENTER ONLY)
ALT: 44 U/L (ref 0–44)
AST: 40 U/L (ref 15–41)
Albumin: 4.1 g/dL (ref 3.5–5.0)
Alkaline Phosphatase: 60 U/L (ref 38–126)
Anion gap: 9 (ref 5–15)
BUN: 22 mg/dL (ref 8–23)
CO2: 25 mmol/L (ref 22–32)
Calcium: 9.1 mg/dL (ref 8.9–10.3)
Chloride: 103 mmol/L (ref 98–111)
Creatinine: 1.12 mg/dL (ref 0.61–1.24)
GFR, Estimated: >60 mL/min (ref >60)
Glucose, Bld: 307 mg/dL — ABNORMAL HIGH (ref 70–99)
Potassium: 5.3 mmol/L — ABNORMAL HIGH (ref 3.5–5.1)
Sodium: 137 mmol/L (ref 135–145)
Total Bilirubin: 0.6 mg/dL (ref 0.3–1.2)
Total Protein: 6.9 g/dL (ref 6.5–8.1)

## 2021-05-04 LAB — LACTATE DEHYDROGENASE: LDH: 172 U/L (ref 98–192)

## 2021-05-04 LAB — SAVE SMEAR(SSMR), FOR PROVIDER SLIDE REVIEW

## 2021-05-04 LAB — URIC ACID: Uric Acid, Serum: 5.2 mg/dL (ref 3.7–8.6)

## 2021-05-04 MED ORDER — ALLOPURINOL 100 MG PO TABS: 100.0000 mg | ORAL_TABLET | Freq: Every day | ORAL | 0 refills | Status: DC

## 2021-05-04 MED ORDER — FAMCICLOVIR 250 MG PO TABS: 250.0000 mg | ORAL_TABLET | Freq: Two times a day (BID) | ORAL | 8 refills | Status: DC

## 2021-05-04 NOTE — Progress Notes (Signed)
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  HPI: Aaron Weaver is a really nice 65 year old white male.  He is originally from [place].  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  He used to smoke.  He stopped a while ago.  He probably has a 50-pack-year history of tobacco use.  He has been healthy.  He apparently had a stent placed and abdominal aortic aneurysm.  I think this was back in March of this year.  There were no complications.  He has not been followed by Dr. [name].  As always, Dr. Alyson Ingles is incredibly thorough.  He wished for Aaron Weaver to have some surveillance test done.  He had a CT scan that was done.  This was done of the chest really is a follow-up for the aneurysm.  This was done in October 2022.  This showed some adenopathy in the abdomen.  I think there was a mass regarding the left kidney.  He was then referred to Dr. Claudia Desanctis of [facility].  To no surprise, Dr. Diona Fanti, been incredibly thorough, ordered a PET scan.  This was done on 04/15/2021.  This showed hypermetabolic lesions involving the face, axilla, pleura, left renal hilum and pelvis.  There was hypermetabolism in the bones.  Based on this, he was then referred to general surgery.  Dr. Michaelle Birks did a excisional biopsy of a right axillary lymph node.  This was done on 04/27/2021.  The pathology report ([id number]) showed diffuse large B-cell lymphoma.  I do not have the molecular analysis back yet.  In view of this, Aaron Weaver was currently referred to the [facility] for an evaluation.Marland Kitchen  He really looks great.  You would never know that he had a problem.  He has had no weight loss.  He has had no fever.  He has had no rashes.  There is been no obviously swollen lymph nodes.  There is no change in bowel or  bladder habits.  He has had no bleeding.  He has had no headache.  He has had no dysphagia.  He has had no nausea or vomiting.  Overall, I would say his performance status is probably ECOG 1.     Past Medical History:  Diagnosis Date   AAA (abdominal aortic aneurysm)    Acute sinusitis, unspecified    Allergic rhinitis    seasonal   Allergy    Anxiety state, unspecified    COPD (chronic obstructive pulmonary disease) (HCC)    Corns and callosities    toe   Cough    Cramp of limb    legs   Depression    Dysrhythmia    GERD (gastroesophageal reflux disease)    Impacted cerumen    Impotence of organic origin    Lumbago    Other and unspecified hyperlipidemia    Routine general medical examination at a health care facility    Skipped heart beats    Tobacco use disorder    Type II or unspecified type diabetes mellitus without mention of complication, not stated as uncontrolled    type II   Unspecified essential hypertension   :   Past Surgical History:  Procedure Laterality Date   ABDOMINAL AORTIC ENDOVASCULAR STENT GRAFT Bilateral 07/12/2020   Procedure: ABDOMINAL AORTIC ENDOVASCULAR STENT GRAFT;  Surgeon: [name], MD;  Location: [facility];  Service: Vascular;  Laterality: Bilateral;   AXILLARY LYMPH NODE BIOPSY Right 04/27/2021   Procedure: AXILLARY EXCISIONAL LYMPH NODE BIOPSY;  Surgeon: Dwan Bolt, MD;  Location: [facility];  Service: General;  Laterality: Right;   BACK SURGERY     CARPECTOMY Right 09/11/2019   Procedure: PROXIMAL ROW CARPECTOMY; RADIAL STYLOIDECTOMY; POSTERIOR INTEROSSIUS NERVE RESECTION;  Surgeon: Daryll Brod, MD;  Location: [facility];  Service: Orthopedics;  Laterality: Right;  AXILLARY BLOCK   COLONOSCOPY     INGUINAL HERNIA REPAIR     right   POLYPECTOMY     TONSILLECTOMY    :   Current Outpatient Medications:    acetaminophen (TYLENOL) 325 MG tablet, Take 650 mg by mouth as needed for mild pain,  moderate pain or headache., Disp: , Rfl:    albuterol (PROAIR HFA) 108 (90 Base) MCG/ACT inhaler, Inhale 1-2 puffs into the lungs every 6 (six) hours as needed for wheezing or shortness of breath., Disp: 8 g, Rfl: 6   alendronate (FOSAMAX) 70 MG tablet, Take 70 mg by mouth every Sunday., Disp: , Rfl:    aspirin EC 81 MG EC tablet, Take 1 tablet (81 mg total) by mouth daily at 6 (six) AM. Swallow whole., Disp: 30 tablet, Rfl: 11   atenolol (TENORMIN) 100 MG tablet, Take 100 mg by mouth daily., Disp: , Rfl:    calcium carbonate (OSCAL) 1500 (600 Ca) MG TABS tablet, Take 600 mg of elemental calcium by mouth every evening., Disp: , Rfl:    DULoxetine (CYMBALTA) 60 MG capsule, Take 60 mg by mouth at bedtime., Disp: , Rfl:    Fish Oil OIL, Take 1,500 mg by mouth daily., Disp: , Rfl:    glimepiride (AMARYL) 2 MG tablet, Take 1 tablet (2 mg total) by mouth daily before breakfast., Disp: 90 tablet, Rfl: 3   JANUMET XR 50-1000 MG TB24, Take 2 tablets by mouth at bedtime., Disp: 30 tablet, Rfl:    omeprazole (PRILOSEC) 40 MG capsule, Take 40 mg by mouth daily., Disp: , Rfl:    potassium chloride SA (KLOR-CON M20) 20 MEQ tablet, TAKE 1 TABLET (20 MEQ TOTAL) BY MOUTH DAILY. , Disp: 30 tablet, Rfl: 5   rosuvastatin (CRESTOR) 10 MG tablet, Take 10 mg by mouth daily., Disp: , Rfl:    tadalafil (CIALIS) 5 MG tablet, Take 5 mg by mouth daily., Disp: , Rfl:    tamsulosin (FLOMAX) 0.4 MG CAPS capsule, Take 0.4 mg by mouth every evening., Disp: , Rfl:    Tiotropium Bromide Monohydrate (SPIRIVA RESPIMAT) 2.5 MCG/ACT AERS, Inhale 2 puffs into the lungs daily., Disp: 4 g, Rfl: 0   Zinc 50 MG TABS, Take 50 mg by mouth daily., Disp: , Rfl: :  :   Allergies  Allergen Reactions   Fexofenadine Other (See Comments)    "dries me out too much"   Hydrochlorothiazide Other (See Comments)    REACTION: cramps   Penicillins Hives   Pravastatin Sodium Other (See Comments)    REACTION: aches   Requip [Ropinirole Hcl] Other  (See Comments)    Bad dreams  :   Family History  Problem Relation Age of Onset   Colon cancer Mother 45   Hypertension Father    Coronary artery disease Other        male<60 and male<50  1st degree relative   Colon polyps Brother    Esophageal cancer Neg Hx  Rectal cancer Neg Hx    Stomach cancer Neg Hx   :   Social History   Socioeconomic History   Marital status: Married    Spouse name: Broox Lonigro   Number of children: 1   Years of education: Not on file   Highest education level: Not on file  Occupational History   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  Vaping Use   Vaping Use: Never used  Substance and Sexual Activity   Alcohol use: No   Drug use: No   Sexual activity: Yes  Other Topics Concern   Not on file  Social History Narrative   Not on file   Social Determinants of Health   Financial Resource Strain: Not on file  Food Insecurity: Not on file  Transportation Needs: Not on file  Physical Activity: Not on file  Stress: Not on file  Social Connections: Not on file  Intimate Partner Violence: Not on file  :  Review of Systems  Constitutional: Negative.   HENT: Negative.    Eyes: Negative.   Respiratory: Negative.    Cardiovascular: Negative.   Gastrointestinal: Negative.   Genitourinary: Negative.   Musculoskeletal: Negative.   Skin: Negative.   Neurological: Negative.   Endo/Heme/Allergies: Negative.   Psychiatric/Behavioral: Negative.      Exam: '@IPVITALS' @ Physical Exam Vitals reviewed.  HENT:     Head: Normocephalic and atraumatic.  Eyes:     Pupils: Pupils are equal, round, and reactive to light.  Cardiovascular:     Rate and Rhythm: Normal rate and regular rhythm.      Heart sounds: Normal heart sounds.  Pulmonary:     Effort: Pulmonary effort is normal.     Breath sounds: Normal breath sounds.  Abdominal:     General: Bowel sounds are normal.     Palpations: Abdomen is soft.  Musculoskeletal:        General: No tenderness or deformity. Normal range of motion.     Cervical back: Normal range of motion.  Lymphadenopathy:     Cervical: No cervical adenopathy.  Skin:    General: Skin is warm and dry.     Findings: No erythema or rash.  Neurological:     Mental Status: He is alert and oriented to person, place, and time.  Psychiatric:        Behavior: Behavior normal.        Thought Content: Thought content normal.        Judgment: Judgment normal.     Recent Labs    05/04/21 1038  WBC 4.9  HGB 14.3  HCT 42.8  PLT 127*    Recent Labs    05/04/21 1038  NA 137  K 5.3*  CL 103  CO2 25  GLUCOSE 307*  BUN 22  CREATININE 1.12  CALCIUM 9.1    Blood smear review: None  Pathology: See above    Assessment and Plan: Aaron Weaver is a really nice 65 year old white male.  He has diffuse large B-cell lymphoma.  This really was found by like.  I must say that Dr. Alyson Ingles and Dr. Diona Fanti were incredibly thorough and  incredibly vigilant in figuring out what was going on.  It looks like by the International Prognostic Index, he has a score of 3.  This would put him at intermediate high risk.  I have to worry about the fact that he does have organ involvement.  Have to worry that he has bone involvement on PET scan.  They were going to have to do a bone marrow biopsy on him.  I think if he does have bone marrow involvement, we are going to have to think about prophylactic CNS treatment at some point.  He is also going need to have an echocardiogram done as we are going to use Adriamycin based therapy.  What we really need though is the molecular markers to see if he has a "double hit" lymphoma.  This certainly has hallmarks of a "double hit"  lymphoma.  This definitely would affect how we would treat him.  He will need to have a Port-A-Cath placed.  I would see if Dr. Zenia Resides can do this for [place].  For right now, I think we would at least start him off on treatment with R-CHOP.  We can always change this if we find that there is a "double hit" lymphoma.  I spent a good hour or so with Aaron Weaver and his wife.  They are very nice.  They have a very strong faith.  I did give him a prayer blanket.  I went over the protocol.  I gave him information about the medications.  I went over side effects.  He will need to have allopurinol.  He will need to be on Famvir for shingles prophylaxis.  It would be nice to try to get started in the next week or so.  We may not be able to get the bone marrow test before we start treatment on him.  I answered all their questions.  Again they are both very very nice.  It was a pleasure meeting them and having good fellowship.

## 2021-05-04 NOTE — Progress Notes (Signed)
Initial RN Navigator Patient Visit  Name: Aaron Weaver Sr. Date of Referral : internal referral Diagnosis: DLBCL  Met with patient prior to their visit with MD. Hanley Seamen patient "Your Patient Navigator" handout which explains my role, areas in which I am able to help, and all the contact information for myself and the office. Also gave patient MD and Navigator business card. Reviewed with patient the general overview of expected course after initial diagnosis and time frame for all steps to be completed.  New patient packet given to patient which includes: orientation to office and staff; campus directory; education on My Chart and Advance Directives; and patient centered education on Lymphoma/DLBCL.   Patient completed visit with Dr. Marin Olp. There is a suspicion for double hit lymphoma. FISH studies are currently pending. Called pathology and spoke with Casimer Bilis. Request for FISH was just sent yesterday, 12/27, and results take 5-7 days. Results wont likely be back until next Tuesday or Wednesday.   He will most likely need BMBX, Echo, port placement, and chemo education. Will follow up tomorrow once office note dictated and orders placed.  Patient understands all follow up procedures and expectations. They have my number to reach out for any further clarification or additional needs.   Oncology Nurse Navigator Documentation  Oncology Nurse Navigator Flowsheets 05/04/2021  Abnormal Finding Date -  Confirmed Diagnosis Date -  Diagnosis Status -  Navigator Follow Up Date: 05/05/2021  Navigator Follow Up Reason: New Patient Appointment  Navigator Location [facility]  Navigator Encounter Type Initial MedOnc  Patient Visit Type MedOnc  Treatment Phase Pre-Tx/Tx Discussion  Barriers/Navigation Needs Coordination of Care;Education  Education Newly Diagnosed Cancer Education;Pain/ Symptom Management;Preparing for Upcoming Surgery/ Treatment  Interventions Coordination of  Care;Education;Psycho-Social Support  Acuity Level 2-Minimal Needs (1-2 Barriers Identified)  Coordination of Care Pathology  Education Method Verbal;Written  Support Groups/Services Friends and Family  Time Spent with Patient 56

## 2021-05-04 NOTE — Progress Notes (Signed)

## 2021-05-05 ENCOUNTER — Encounter: Payer: Self-pay | Admitting: *Deleted

## 2021-05-05 ENCOUNTER — Other Ambulatory Visit: Payer: Self-pay | Admitting: Pharmacist

## 2021-05-05 NOTE — Progress Notes (Signed)
[employer]'s preferred product for G-CSF is Ziextenzo. Product changed to reflect this.

## 2021-05-05 NOTE — Progress Notes (Signed)
Patient needs to have the following to begin treatment:  Port: Dr Marin Olp has spoken to Dr Zenia Resides about placing the port in the OR. Will follow up on 05/10/2021 for scheduling.   Chemo Education: 05/17/2021  Bone Marrow Biopsy: 05/30/2021  Echo: 05/19/2021  Chemo: 05/20/2021  Called and spoke to patient's wife, Butch Penny. All appointments reviewed including date, time and location. Calendar also printed and mailed to patient's home with instructions for each appointment.   Oncology Nurse Navigator Documentation  Oncology Nurse Navigator Flowsheets 05/05/2021  Abnormal Finding Date -  Confirmed Diagnosis Date -  Diagnosis Status -  Navigator Follow Up Date: 05/10/2021  Navigator Follow Up Reason: Appointment Review  Navigator Location [facility]  Navigator Encounter Type Appt/Treatment Plan Review;Telephone  Telephone Appt Confirmation/Clarification;Education;Outgoing Call  Patient Visit Type MedOnc  Treatment Phase Pre-Tx/Tx Discussion  Barriers/Navigation Needs Coordination of Care;Education  Education Other  Interventions Coordination of Care;Education;Psycho-Social Support  Acuity Level 2-Minimal Needs (1-2 Barriers Identified)  Coordination of Care Appts;Radiology  Education Method Verbal;Teach-back;Written  Support Groups/Services Friends and Family  Time Spent with Patient 11

## 2021-05-06 ENCOUNTER — Ambulatory Visit: Payer: Self-pay | Admitting: Surgery

## 2021-05-10 ENCOUNTER — Encounter: Payer: Self-pay | Admitting: *Deleted

## 2021-05-10 ENCOUNTER — Encounter (HOSPITAL_COMMUNITY): Payer: Self-pay | Admitting: Surgery

## 2021-05-10 ENCOUNTER — Other Ambulatory Visit: Payer: Self-pay

## 2021-05-10 ENCOUNTER — Encounter (HOSPITAL_COMMUNITY): Payer: Self-pay

## 2021-05-10 DIAGNOSIS — F17201 Nicotine dependence, unspecified, in remission: Secondary | ICD-10-CM | POA: Diagnosis not present

## 2021-05-10 DIAGNOSIS — Z0000 Encounter for general adult medical examination without abnormal findings: Secondary | ICD-10-CM | POA: Diagnosis not present

## 2021-05-10 DIAGNOSIS — E1169 Type 2 diabetes mellitus with other specified complication: Secondary | ICD-10-CM | POA: Diagnosis not present

## 2021-05-10 DIAGNOSIS — M545 Low back pain, unspecified: Secondary | ICD-10-CM | POA: Diagnosis not present

## 2021-05-10 DIAGNOSIS — K219 Gastro-esophageal reflux disease without esophagitis: Secondary | ICD-10-CM | POA: Diagnosis not present

## 2021-05-10 DIAGNOSIS — E7800 Pure hypercholesterolemia, unspecified: Secondary | ICD-10-CM | POA: Diagnosis not present

## 2021-05-10 DIAGNOSIS — M8588 Other specified disorders of bone density and structure, other site: Secondary | ICD-10-CM | POA: Diagnosis not present

## 2021-05-10 DIAGNOSIS — I10 Essential (primary) hypertension: Secondary | ICD-10-CM | POA: Diagnosis not present

## 2021-05-10 NOTE — Progress Notes (Signed)
Patient is scheduled for port on 05/13/2021  Oncology Nurse Navigator Documentation  Oncology Nurse Navigator Flowsheets 05/10/2021  Abnormal Finding Date -  Confirmed Diagnosis Date -  Diagnosis Status -  Navigator Follow Up Date: 05/17/2021  Navigator Follow Up Reason: Chemo Class  Navigator Location [facility]  Navigator Encounter Type Appt/Treatment Plan Review  Telephone -  Patient Visit Type MedOnc  Treatment Phase Pre-Tx/Tx Discussion  Barriers/Navigation Needs Coordination of Care;Education  Education -  Interventions None Required  Acuity Level 2-Minimal Needs (1-2 Barriers Identified)  Coordination of Care -  Education Method -  Support Groups/Services Friends and Family  Time Spent with Patient 15

## 2021-05-10 NOTE — Progress Notes (Addendum)
For Short Stay: [place] appointment date: N/A Date of COVID positive in last 33 days:N/A   For Anesthesia: PCP - [name], MD Cardiologist - Jerline Pain, MD last office visit 09/21/20 in epic  Chest x-ray - 07/12/20 in epic EKG - 04/22/21 in epic Stress Test - N/A ECHO - greater than 2 years Cardiac Cath - N/A Pacemaker/ICD device last checked: N/A Pacemaker orders received:N/A Device Rep notified:N/A  Sleep Study - N/A CPAP - N/A  Fasting Blood Sugar - N/A Checks Blood Sugar ___N/A__ times a day  Blood Thinner Instructions: N/A Aspirin Instructions:  Last Dose: 04/25/21  Activity level:  Unable to go up a flight of stairs without shortness of breath     Anesthesia review: COPD, AAA repair 07/12/20  Patient denies shortness of breath, fever, cough and chest pain at PAT appointment   Patient verbalized understanding of instructions that were given to them at the PAT appointment. Patient was also instructed that they will need to review over the PAT instructions again at home before surgery.

## 2021-05-10 NOTE — Progress Notes (Signed)
Anesthesia Chart Review:   Case: [id number] Date/Time: 05/13/21 0715   Procedure: INSERTION PORT-A-CATH   Anesthesia type: General   Pre-op diagnosis: LYMPHADENOPATHY   Location: [facility] / [facility]   Surgeons: Dwan Bolt, MD       DISCUSSION: Pt is 66 years old with hx HTN, frequent PVCs, COPD, DM, AAA (s/p EVAR 07/2020), lymphoma   PROVIDERS: - PCP is [name], MD - Oncologist is Burney Gauze, MD - Cardiologist is Candee Furbish, MD. Last office visit 09/21/20 - Vascular surgeon is Harold Barban, MD  LABS: Labs reviewed: Acceptable for surgery. - CBC w/diff 05/04/21: platelets 127 - CMP 05/04/21: K 5.3, glucose 307 - HbA1c day of surgery   IMAGES: CT angio abdomen/pelvis 02/25/21:  VASCULAR Since CTA abdomen pelvis dated 08/09/2020; 1. Endograft repair of AAA with stable size of excluded aneurysm sac. No CTA evidence of endoleak. 2. No acute vascular abdominopelvic findings.   NON-VASCULAR 1. Persistence of mildly-enhancing soft tissue fullness involving the LEFT kidney at the hilum and inferior renal pole, and retroperitoneum at the RIGHT hypogastric bifurcation and lateral to the LEFT CIA (see key images). Findings suspicious for lymphoproliferative process, attention on follow-up and consider oncological workup if not previously performed. 2. 1.0 cm RIGHT hepatic lobe enhancing lesion, likely a small flash fill hemangioma. Attention on follow-up. 3. No acute nonvascular abdominopelvic findings.   EKG 04/22/21: Sinus rhythm with occasional PVCs   CV: Echo 06/20/18:  1. The left ventricle has normal systolic function, with an ejection fraction of 55-60%. The cavity size was normal. Left ventricular diastolic Doppler parameters are consistent with impaired relaxation No evidence of left ventricular regional wall motion abnormalities.  2. The right ventricle has normal systolic function. The cavity was normal. There is no increase in right ventricular wall  thickness.  3. The mitral valve is normal in structure. No evidence of mitral valve stenosis. No regurgitation.  4. The tricuspid valve is normal in structure.  5. The aortic valve is tricuspid There is mild calcification of the aortic valve. Aortic valve regurgitation is trivial by color flow Doppler. No stenosis.  6. The aortic root and ascending aorta are normal in size and structure.  7. No evidence of left ventricular regional wall motion abnormalities.  8. No complete TR doppler jet so unable to estimate PA systolic pressure.    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[place]) 05/04/2021   Dysrhythmia    GERD (gastroesophageal reflux disease)    Goals of care, counseling/discussion 05/04/2021   History of dehydration    Impacted cerumen    Impotence of organic origin    Lumbago    Lung nodule    Other and unspecified hyperlipidemia    Routine general medical examination at a health care facility    Skipped heart beats    Tobacco use disorder    Type II or unspecified type diabetes mellitus without mention of complication, not stated as uncontrolled    type II   Unspecified essential hypertension     Past  Surgical History:  Procedure Laterality Date   ABDOMINAL AORTIC ENDOVASCULAR STENT GRAFT Bilateral 07/12/2020   Procedure: ABDOMINAL AORTIC ENDOVASCULAR STENT GRAFT;  Surgeon: [name], MD;  Location: [facility];  Service: Vascular;  Laterality: Bilateral;   AXILLARY LYMPH NODE BIOPSY Right 04/27/2021   Procedure: AXILLARY EXCISIONAL LYMPH NODE BIOPSY;  Surgeon: Dwan Bolt, MD;  Location: [facility];  Service:  General;  Laterality: Right;   BACK SURGERY     fracture   CARPECTOMY Right 09/11/2019   Procedure: PROXIMAL ROW CARPECTOMY; RADIAL STYLOIDECTOMY; POSTERIOR INTEROSSIUS NERVE RESECTION;  Surgeon: Daryll Brod, MD;  Location: [facility];  Service: Orthopedics;  Laterality: Right;  AXILLARY BLOCK   COLONOSCOPY     INGUINAL HERNIA REPAIR     right   POLYPECTOMY     TONSILLECTOMY      MEDICATIONS:  acetaminophen (TYLENOL) 325 MG tablet   albuterol (PROAIR HFA) 108 (90 Base) MCG/ACT inhaler   alendronate (FOSAMAX) 70 MG tablet   allopurinol (ZYLOPRIM) 100 MG tablet   aspirin EC 81 MG EC tablet   atenolol (TENORMIN) 100 MG tablet   calcium carbonate (OSCAL) 1500 (600 Ca) MG TABS tablet   diclofenac Sodium (VOLTAREN) 1 % GEL   DULoxetine (CYMBALTA) 60 MG capsule   famciclovir (FAMVIR) 250 MG tablet   Fish Oil OIL   glimepiride (AMARYL) 2 MG tablet   JANUMET XR 50-1000 MG TB24   omeprazole (PRILOSEC) 40 MG capsule   potassium chloride SA (KLOR-CON M20) 20 MEQ tablet   rosuvastatin (CRESTOR) 10 MG tablet   tadalafil (CIALIS) 5 MG tablet   tamsulosin (FLOMAX) 0.4 MG CAPS capsule   Tiotropium Bromide Monohydrate (SPIRIVA RESPIMAT) 2.5 MCG/ACT AERS   Zinc 50 MG TABS   No current facility-administered medications for this encounter.    If no changes, I anticipate pt can proceed with surgery as scheduled.   Aaron Cass, PhD, FNP-BC [facility]/Anesthesiology Phone: [phone] 05/10/2021 4:12 PM

## 2021-05-10 NOTE — Anesthesia Preprocedure Evaluation (Addendum)
Anesthesia Evaluation  Patient identified by MRN, date of birth, ID band Patient awake    Reviewed: Allergy & Precautions, NPO status , Patient's Chart, lab work & pertinent test results  Airway Mallampati: II  TM Distance: >3 FB Neck ROM: Full    Dental  (+) Edentulous Lower, Edentulous Upper   Pulmonary COPD,  COPD inhaler, former smoker,     + wheezing      Cardiovascular hypertension, Pt. on medications and Pt. on home beta blockers Normal cardiovascular exam Rhythm:Regular Rate:Normal  EKG 04/22/21: Sinus rhythm with occasional PVCs   CV: Echo 06/20/18:  1. The left ventricle has normal systolic function, with an ejection fraction of 55-60%. The cavity size was normal. Left ventricular diastolic Doppler parameters are consistent with impaired relaxation No evidence of left ventricular regional wall motion abnormalities.  2. The right ventricle has normal systolic function. The cavity was normal. There is no increase in right ventricular wall thickness.  3. The mitral valve is normal in structure. No evidence of mitral valve stenosis. No regurgitation.  4. The tricuspid valve is normal in structure.  5. The aortic valve is tricuspid There is mild calcification of the aortic valve. Aortic valve regurgitation is trivial by color flow Doppler. No stenosis.  6. The aortic root and ascending aorta are normal in size and structure.  7. No evidence of left ventricular regional wall motion abnormalities.  8. No complete TR doppler jet so unable to estimate PA systolic pressure.    Neuro/Psych PSYCHIATRIC DISORDERS Anxiety Depression negative neurological ROS     GI/Hepatic Neg liver ROS, GERD  Medicated and Controlled,  Endo/Other  diabetes, Type 2, Oral Hypoglycemic Agents  Renal/GU negative Renal ROS  negative genitourinary   Musculoskeletal negative musculoskeletal ROS (+)   Abdominal   Peds   Hematology thrombocytopenia   Anesthesia Other Findings H/o lyphoma  Reproductive/Obstetrics negative OB ROS                         Anesthesia Physical Anesthesia Plan  ASA: 3  Anesthesia Plan: General   Post-op Pain Management: Tylenol PO (pre-op)   Induction: Intravenous  PONV Risk Score and Plan: 2 and Treatment may vary due to age or medical condition and Ondansetron  Airway Management Planned: LMA  Additional Equipment:   Intra-op Plan:   Post-operative Plan: Extubation in OR  Informed Consent: I have reviewed the patients History and Physical, chart, labs and discussed the procedure including the risks, benefits and alternatives for the proposed anesthesia with the patient or authorized representative who has indicated his/her understanding and acceptance.     Dental advisory given  Plan Discussed with: CRNA and Anesthesiologist  Anesthesia Plan Comments: ( )      Anesthesia Quick Evaluation

## 2021-05-11 ENCOUNTER — Ambulatory Visit
Admission: RE | Admit: 2021-05-11 | Discharge: 2021-05-11 | Disposition: A | Discharge status: Home / Self Care | Payer: Medicare Other | Source: Ambulatory Visit | Attending: Family Medicine | Admitting: Family Medicine

## 2021-05-11 DIAGNOSIS — Z13820 Encounter for screening for osteoporosis: Secondary | ICD-10-CM | POA: Diagnosis not present

## 2021-05-11 DIAGNOSIS — M8580 Other specified disorders of bone density and structure, unspecified site: Secondary | ICD-10-CM

## 2021-05-12 DIAGNOSIS — E7800 Pure hypercholesterolemia, unspecified: Secondary | ICD-10-CM | POA: Diagnosis not present

## 2021-05-12 DIAGNOSIS — E1169 Type 2 diabetes mellitus with other specified complication: Secondary | ICD-10-CM | POA: Diagnosis not present

## 2021-05-12 DIAGNOSIS — I10 Essential (primary) hypertension: Secondary | ICD-10-CM | POA: Diagnosis not present

## 2021-05-12 NOTE — Progress Notes (Signed)
Pharmacist Chemotherapy Monitoring - Initial Assessment    Anticipated start date: 05/20/21   The following has been reviewed per standard work regarding the patient's treatment regimen: The patient's diagnosis, treatment plan and drug doses, and organ/hematologic function Lab orders and baseline tests specific to treatment regimen  The treatment plan start date, drug sequencing, and pre-medications Prior authorization status  Patient's documented medication list, including drug-drug interaction screen and prescriptions for anti-emetics and supportive care specific to the treatment regimen The drug concentrations, fluid compatibility, administration routes, and timing of the medications to be used The patient's access for treatment and lifetime cumulative dose history, if applicable  The patient's medication allergies and previous infusion related reactions, if applicable   Changes made to treatment plan:  treatment plan date  Follow up needed:  Echo and heb panel ordered but not complete as of review   Judge Stall, [facility], 05/12/2021  2:51 PM

## 2021-05-13 ENCOUNTER — Encounter: Payer: Self-pay | Admitting: *Deleted

## 2021-05-13 ENCOUNTER — Ambulatory Visit (HOSPITAL_COMMUNITY): Payer: Medicare Other | Admitting: Emergency Medicine

## 2021-05-13 ENCOUNTER — Encounter (HOSPITAL_COMMUNITY)
Admission: RE | Disposition: A | Discharge status: Home / Self Care | Payer: Self-pay | Source: Home / Self Care | Attending: Surgery

## 2021-05-13 ENCOUNTER — Ambulatory Visit (HOSPITAL_COMMUNITY): Payer: Medicare Other

## 2021-05-13 ENCOUNTER — Ambulatory Visit (HOSPITAL_COMMUNITY)
Admission: RE | Admit: 2021-05-13 | Discharge: 2021-05-13 | Disposition: A | Discharge status: Home / Self Care | Payer: Medicare Other | Attending: Surgery | Admitting: Surgery

## 2021-05-13 ENCOUNTER — Ambulatory Visit (HOSPITAL_COMMUNITY): Payer: Medicare Other | Admitting: Anesthesiology

## 2021-05-13 ENCOUNTER — Encounter (HOSPITAL_COMMUNITY): Payer: Self-pay | Admitting: Surgery

## 2021-05-13 DIAGNOSIS — K219 Gastro-esophageal reflux disease without esophagitis: Secondary | ICD-10-CM | POA: Insufficient documentation

## 2021-05-13 DIAGNOSIS — Z7984 Long term (current) use of oral hypoglycemic drugs: Secondary | ICD-10-CM | POA: Diagnosis not present

## 2021-05-13 DIAGNOSIS — Z87891 Personal history of nicotine dependence: Secondary | ICD-10-CM | POA: Diagnosis not present

## 2021-05-13 DIAGNOSIS — I700 Atherosclerosis of aorta: Secondary | ICD-10-CM | POA: Insufficient documentation

## 2021-05-13 DIAGNOSIS — Z452 Encounter for adjustment and management of vascular access device: Secondary | ICD-10-CM | POA: Diagnosis not present

## 2021-05-13 DIAGNOSIS — J449 Chronic obstructive pulmonary disease, unspecified: Secondary | ICD-10-CM | POA: Diagnosis not present

## 2021-05-13 DIAGNOSIS — Z79899 Other long term (current) drug therapy: Secondary | ICD-10-CM | POA: Insufficient documentation

## 2021-05-13 DIAGNOSIS — I10 Essential (primary) hypertension: Secondary | ICD-10-CM | POA: Diagnosis not present

## 2021-05-13 DIAGNOSIS — E119 Type 2 diabetes mellitus without complications: Secondary | ICD-10-CM | POA: Insufficient documentation

## 2021-05-13 DIAGNOSIS — E1169 Type 2 diabetes mellitus with other specified complication: Secondary | ICD-10-CM

## 2021-05-13 DIAGNOSIS — C8330 Diffuse large B-cell lymphoma, unspecified site: Secondary | ICD-10-CM | POA: Diagnosis not present

## 2021-05-13 DIAGNOSIS — R918 Other nonspecific abnormal finding of lung field: Secondary | ICD-10-CM | POA: Diagnosis not present

## 2021-05-13 DIAGNOSIS — Z95828 Presence of other vascular implants and grafts: Secondary | ICD-10-CM

## 2021-05-13 DIAGNOSIS — C8338 Diffuse large B-cell lymphoma, lymph nodes of multiple sites: Secondary | ICD-10-CM | POA: Diagnosis not present

## 2021-05-13 HISTORY — DX: Solitary pulmonary nodule: R91.1

## 2021-05-13 HISTORY — PX: PORTACATH PLACEMENT: SHX2246

## 2021-05-13 HISTORY — DX: Personal history of other endocrine, nutritional and metabolic disease: Z86.39

## 2021-05-13 LAB — HEMOGLOBIN A1C
Hgb A1c MFr Bld: 9.1 % — ABNORMAL HIGH (ref 4.8–5.6)
Mean Plasma Glucose: 214.47 mg/dL

## 2021-05-13 LAB — GLUCOSE, CAPILLARY
Glucose-Capillary: 213 mg/dL — ABNORMAL HIGH (ref 70–99)
Glucose-Capillary: 225 mg/dL — ABNORMAL HIGH (ref 70–99)

## 2021-05-13 SURGERY — INSERTION, TUNNELED CENTRAL VENOUS DEVICE, WITH PORT
Anesthesia: General | Laterality: Right

## 2021-05-13 MED ORDER — BUPIVACAINE-EPINEPHRINE 0.25% -1:200000 IJ SOLN
INTRAMUSCULAR | Status: AC
  Filled 2021-05-13: qty 1

## 2021-05-13 MED ORDER — FENTANYL CITRATE (PF) 100 MCG/2ML IJ SOLN
INTRAMUSCULAR | Status: DC | PRN
Start: 2021-05-13 — End: 2021-05-13
  Administered 2021-05-13: 50 ug via INTRAVENOUS

## 2021-05-13 MED ORDER — HEPARIN SOD (PORK) LOCK FLUSH 100 UNIT/ML IV SOLN
INTRAVENOUS | Status: AC
  Filled 2021-05-13: qty 5

## 2021-05-13 MED ORDER — MIDAZOLAM HCL 2 MG/2ML IJ SOLN
INTRAMUSCULAR | Status: AC
  Filled 2021-05-13: qty 2

## 2021-05-13 MED ORDER — OXYCODONE HCL 5 MG/5ML PO SOLN: 5.0000 mg | Freq: Once | ORAL | Status: DC | PRN

## 2021-05-13 MED ORDER — VASOPRESSIN 20 UNIT/ML IV SOLN
INTRAVENOUS | Status: AC
  Filled 2021-05-13: qty 1

## 2021-05-13 MED ORDER — PHENYLEPHRINE HCL-NACL 20-0.9 MG/250ML-% IV SOLN
INTRAVENOUS | Status: DC | PRN
  Administered 2021-05-13: 80 ug/min via INTRAVENOUS

## 2021-05-13 MED ORDER — VASOPRESSIN 20 UNIT/ML IV SOLN
INTRAVENOUS | Status: DC | PRN
  Administered 2021-05-13 (×2): 1 [IU] via INTRAVENOUS

## 2021-05-13 MED ORDER — FENTANYL CITRATE PF 50 MCG/ML IJ SOSY: 25.0000 ug | PREFILLED_SYRINGE | INTRAMUSCULAR | Status: DC | PRN

## 2021-05-13 MED ORDER — HEPARIN SOD (PORK) LOCK FLUSH 100 UNIT/ML IV SOLN
INTRAVENOUS | Status: DC | PRN
  Administered 2021-05-13: 500 [IU] via INTRAVENOUS

## 2021-05-13 MED ORDER — ALBUMIN HUMAN 5 % IV SOLN: INTRAVENOUS | Status: DC | PRN

## 2021-05-13 MED ORDER — PHENYLEPHRINE HCL (PRESSORS) 10 MG/ML IV SOLN
INTRAVENOUS | Status: AC
  Filled 2021-05-13: qty 1

## 2021-05-13 MED ORDER — BUPIVACAINE-EPINEPHRINE 0.25% -1:200000 IJ SOLN
INTRAMUSCULAR | Status: DC | PRN
  Administered 2021-05-13: 10 mL

## 2021-05-13 MED ORDER — HEPARIN 6000 UNIT IRRIGATION SOLUTION
Freq: Once | Status: AC
  Administered 2021-05-13: 1
  Filled 2021-05-13: qty 6000

## 2021-05-13 MED ORDER — PHENYLEPHRINE 40 MCG/ML (10ML) SYRINGE FOR IV PUSH (FOR BLOOD PRESSURE SUPPORT)
PREFILLED_SYRINGE | INTRAVENOUS | Status: DC | PRN
  Administered 2021-05-13: 80 ug via INTRAVENOUS
  Administered 2021-05-13 (×2): 200 ug via INTRAVENOUS

## 2021-05-13 MED ORDER — PROPOFOL 10 MG/ML IV BOLUS
INTRAVENOUS | Status: AC
  Filled 2021-05-13: qty 20

## 2021-05-13 MED ORDER — LIDOCAINE 2% (20 MG/ML) 5 ML SYRINGE
INTRAMUSCULAR | Status: DC | PRN
  Administered 2021-05-13: 80 mg via INTRAVENOUS

## 2021-05-13 MED ORDER — AMISULPRIDE (ANTIEMETIC) 5 MG/2ML IV SOLN: 10.0000 mg | Freq: Once | INTRAVENOUS | Status: DC | PRN

## 2021-05-13 MED ORDER — ORAL CARE MOUTH RINSE: 15.0000 mL | Freq: Once | OROMUCOSAL | Status: AC

## 2021-05-13 MED ORDER — PHENYLEPHRINE 40 MCG/ML (10ML) SYRINGE FOR IV PUSH (FOR BLOOD PRESSURE SUPPORT)
PREFILLED_SYRINGE | INTRAVENOUS | Status: AC
  Filled 2021-05-13: qty 10

## 2021-05-13 MED ORDER — GLYCOPYRROLATE 0.2 MG/ML IJ SOLN
INTRAMUSCULAR | Status: DC | PRN
Start: 2021-05-13 — End: 2021-05-13
  Administered 2021-05-13: .2 mg via INTRAVENOUS

## 2021-05-13 MED ORDER — LIDOCAINE HCL (PF) 2 % IJ SOLN
INTRAMUSCULAR | Status: AC
  Filled 2021-05-13: qty 5

## 2021-05-13 MED ORDER — CEFAZOLIN SODIUM-DEXTROSE 2-4 GM/100ML-% IV SOLN
2.0000 g | INTRAVENOUS | Status: AC
  Administered 2021-05-13: 2 g via INTRAVENOUS
  Filled 2021-05-13: qty 100

## 2021-05-13 MED ORDER — LACTATED RINGERS IV SOLN: INTRAVENOUS | Status: DC

## 2021-05-13 MED ORDER — EPHEDRINE SULFATE-NACL 50-0.9 MG/10ML-% IV SOSY
PREFILLED_SYRINGE | INTRAVENOUS | Status: DC | PRN
Start: 2021-05-13 — End: 2021-05-13
  Administered 2021-05-13 (×2): 10 mg via INTRAVENOUS

## 2021-05-13 MED ORDER — MIDAZOLAM HCL 5 MG/5ML IJ SOLN
INTRAMUSCULAR | Status: DC | PRN
  Administered 2021-05-13: 1 mg via INTRAVENOUS

## 2021-05-13 MED ORDER — FENTANYL CITRATE (PF) 100 MCG/2ML IJ SOLN
INTRAMUSCULAR | Status: AC
  Filled 2021-05-13: qty 2

## 2021-05-13 MED ORDER — ONDANSETRON HCL 4 MG/2ML IJ SOLN: 4.0000 mg | Freq: Once | INTRAMUSCULAR | Status: DC | PRN

## 2021-05-13 MED ORDER — CHLORHEXIDINE GLUCONATE 0.12 % MT SOLN
15.0000 mL | Freq: Once | OROMUCOSAL | Status: AC
  Administered 2021-05-13: 15 mL via OROMUCOSAL

## 2021-05-13 MED ORDER — OXYCODONE HCL 5 MG PO TABS: 5.0000 mg | ORAL_TABLET | Freq: Once | ORAL | Status: DC | PRN

## 2021-05-13 MED ORDER — 0.9 % SODIUM CHLORIDE (POUR BTL) OPTIME
TOPICAL | Status: DC | PRN
  Administered 2021-05-13: 1000 mL

## 2021-05-13 MED ORDER — PROPOFOL 10 MG/ML IV BOLUS
INTRAVENOUS | Status: DC | PRN
Start: 2021-05-13 — End: 2021-05-13
  Administered 2021-05-13: 20 mg via INTRAVENOUS
  Administered 2021-05-13: 150 mg via INTRAVENOUS

## 2021-05-13 MED ORDER — ONDANSETRON HCL 4 MG/2ML IJ SOLN
INTRAMUSCULAR | Status: DC | PRN
  Administered 2021-05-13: 4 mg via INTRAVENOUS

## 2021-05-13 MED ORDER — ACETAMINOPHEN 500 MG PO TABS
1000.0000 mg | ORAL_TABLET | Freq: Once | ORAL | Status: AC
  Administered 2021-05-13: 1000 mg via ORAL
  Filled 2021-05-13: qty 2

## 2021-05-13 SURGICAL SUPPLY — 43 items
ADH SKN CLS APL DERMABOND .7 (GAUZE/BANDAGES/DRESSINGS) ×1
APL PRP STRL LF DISP 70% ISPRP (MISCELLANEOUS) ×1
APL SKNCLS STERI-STRIP NONHPOA (GAUZE/BANDAGES/DRESSINGS)
BAG COUNTER SPONGE SURGICOUNT (BAG) IMPLANT
BAG DECANTER FOR FLEXI CONT (MISCELLANEOUS) ×3 IMPLANT
BAG SPNG CNTER NS LX DISP (BAG)
BENZOIN TINCTURE PRP APPL 2/3 (GAUZE/BANDAGES/DRESSINGS) ×2 IMPLANT
BLADE SURG 15 STRL LF DISP TIS (BLADE) ×2 IMPLANT
BLADE SURG 15 STRL SS (BLADE) ×2
BLADE SURG SZ11 CARB STEEL (BLADE) ×3 IMPLANT
CHLORAPREP W/TINT 26 (MISCELLANEOUS) ×3 IMPLANT
CLSR STERI-STRIP ANTIMIC 1/2X4 (GAUZE/BANDAGES/DRESSINGS) IMPLANT
COVER SURGICAL LIGHT HANDLE (MISCELLANEOUS) ×3 IMPLANT
DECANTER SPIKE VIAL GLASS SM (MISCELLANEOUS) ×3 IMPLANT
DERMABOND ADVANCED (GAUZE/BANDAGES/DRESSINGS) ×1
DERMABOND ADVANCED .7 DNX12 (GAUZE/BANDAGES/DRESSINGS) ×2 IMPLANT
DRAPE C-ARM 42X120 X-RAY (DRAPES) ×3 IMPLANT
DRAPE LAPAROSCOPIC ABDOMINAL (DRAPES) ×3 IMPLANT
DRAPE UTILITY XL STRL (DRAPES) ×1 IMPLANT
ELECT REM PT RETURN 15FT ADLT (MISCELLANEOUS) ×3 IMPLANT
GAUZE 4X4 16PLY ~~LOC~~+RFID DBL (SPONGE) ×3 IMPLANT
GAUZE SPONGE 4X4 12PLY STRL (GAUZE/BANDAGES/DRESSINGS) ×2 IMPLANT
GLOVE SURG POLYISO LF SZ5.5 (GLOVE) ×3 IMPLANT
GLOVE SURG POLYISO LF SZ6 (GLOVE) ×3 IMPLANT
GOWN STRL REUS W/TWL LRG LVL3 (GOWN DISPOSABLE) ×3 IMPLANT
GOWN STRL REUS W/TWL XL LVL3 (GOWN DISPOSABLE) ×3 IMPLANT
KIT BASIN OR (CUSTOM PROCEDURE TRAY) ×3 IMPLANT
KIT PORT POWER 8FR ISP CVUE (Port) ×3 IMPLANT
KIT TURNOVER KIT A (KITS) IMPLANT
NEEDLE HYPO 22GX1.5 SAFETY (NEEDLE) ×3 IMPLANT
PACK BASIC VI WITH GOWN DISP (CUSTOM PROCEDURE TRAY) ×3 IMPLANT
PENCIL SMOKE EVACUATOR (MISCELLANEOUS) ×1 IMPLANT
SUT MNCRL AB 4-0 PS2 18 (SUTURE) ×3 IMPLANT
SUT PROLENE 2 0 SH DA (SUTURE) ×6 IMPLANT
SUT VIC AB 2-0 SH 18 (SUTURE) IMPLANT
SUT VIC AB 2-0 SH 27 (SUTURE)
SUT VIC AB 2-0 SH 27X BRD (SUTURE) IMPLANT
SUT VIC AB 3-0 SH 27 (SUTURE) ×2
SUT VIC AB 3-0 SH 27X BRD (SUTURE) ×2 IMPLANT
SYR 10ML LL (SYRINGE) ×3 IMPLANT
SYR 20ML LL LF (SYRINGE) ×3 IMPLANT
TOWEL OR 17X26 10 PK STRL BLUE (TOWEL DISPOSABLE) ×3 IMPLANT
TOWEL OR NON WOVEN STRL DISP B (DISPOSABLE) ×3 IMPLANT

## 2021-05-13 NOTE — Anesthesia Postprocedure Evaluation (Signed)
Anesthesia Post Note  Patient: Aaron KERTH Sr.  Procedure(s) Performed: INSERTION PORT-A-CATH (Right)     Patient location during evaluation: PACU Anesthesia Type: General Level of consciousness: sedated Pain management: pain level controlled Vital Signs Assessment: post-procedure vital signs reviewed and stable Respiratory status: spontaneous breathing and respiratory function stable Cardiovascular status: stable Postop Assessment: no apparent nausea or vomiting Anesthetic complications: no   No notable events documented.  Last Vitals:  Vitals:   05/13/21 0845 05/13/21 0900  BP: 111/75 113/71  Pulse: 70 71  Resp: (!) 23 15  Temp:    SpO2: 99% 98%    Last Pain:  Vitals:   05/13/21 0900  TempSrc:   PainSc: 0-No pain                 Merlinda Frederick

## 2021-05-13 NOTE — Op Note (Signed)
Date: 05/13/21  Patient: Aaron Weaver. MRN: [id number]  Preoperative Diagnosis: Diffuse large B cell lymphoma Postoperative Diagnosis: Same  Procedure: Port-a-cath insertion  Surgeon: Michaelle Birks, MD  EBL: Minimal  Anesthesia: General LMA  Specimens: None  Indications: Mr. Eisler is a 66 yo male who presented with lymphadenopathy, and lymph node biopsy confirmed a diagnosis of lymphoma. He will begin chemotherapy and presents today for port placement.  Findings: 8-Fr single lumen power port placed via the right subclavian vein under fluoroscopic guidance. Total catheter length of 17cm.  Procedure details: Informed consent was obtained in the preoperative area prior to the procedure. The patient was brought to the operating room and placed on the table in the supine position. General anesthesia was induced and appropriate lines and drains were placed for intraoperative monitoring. Perioperative antibiotics were administered per SCIP guidelines. The chest and neck were prepped and draped in the usual sterile fashion. A pre-procedure timeout was taken verifying patient identity, surgical site and procedure to be performed.  The patient was placed in Trendelenberg and the right subclavian vein was accessed with a large-bore needle. A guidewire was inserted and advanced, and position in the SVC was confirmed fluoroscopically. The needle was removed and the wire was clipped to the drapes to secure its position. Next a place for the port was chosen on the upper lateral right chest and the skin was infiltrated with 0.25% bupivicaine. A small skin incision was made and a subcutaneous pocket was created with cautery. The port and catheter were then flushed and brought onto the field. Three 2-0 prolene sutures were used to secure the port in the subcutaneous pocket to the pectoralis fascia, but the sutures were not tied down. The port was placed in the pocket and the attached cathether was tunneled  beneath the skin to the wire exit site. The catheter was then measured using fluoro - it was placed over the skin adjacent to the guidewire, and marked externally at the cavoatrial junction. The catheter was then cut at this location, which was at 17cm. The dilator and sheath were then advanced over the guidewire under fluoroscopic guidance, and the wire and dilator were removed. The end of the catheter was inserted through the sheath and advanced, and the sheath was peeled away. The port was then accessed with a Huber needle, and blood was aspirated and the port was flushed with heparinized saline. A final fluoroscopic image confirmed appropriate position of the catheter tip within the SVC, without kinking of the catheter. The prolene sutures were tied down. A final flush of 500 units heparin (100 units/mL) was given via the port. The skin was closed with a deep dermal layer of interrupted 3-0 Vicryl suture, followed by a running subcuticular 4-0 monocryl suture. Dermabond was applied.  The patient tolerated the procedure well with no apparent complications. All counts were correct x2 at the end of the procedure. The patient was extubated and taken to PACU in stable condition.  Michaelle Birks, MD 05/13/21 8:29 AM

## 2021-05-13 NOTE — Discharge Instructions (Signed)
SURGERY DISCHARGE INSTRUCTIONS: PORT-A-CATH PLACEMENT ° °Activity °You may resume your usual activities as tolerated °Ok to shower in 48 hours, but do not bathe or submerge incisions underwater. °Do not drive while taking narcotic pain medication. ° °Wound Care °Your incision is covered with skin glue called Dermabond. This will peel off on its own over time. °You may shower and allow warm soapy water to run over your incisions. Gently pat dry. °Do not submerge your incision underwater. °Monitor your incision for any new redness, tenderness, or drainage. °You may start using your port in 48 hours. Do not apply EMLA cream directly over the Dermabond (skin glue). ° °When to Call Us: °Fever greater than 100.5 °New redness, drainage, or swelling at incision site °Severe pain, nausea, or vomiting °Shortness of breath, difficulty breathing ° °For questions or concerns, please call the [facility] office at [phone]. ° °

## 2021-05-13 NOTE — Anesthesia Procedure Notes (Signed)
Procedure Name: LMA Insertion Date/Time: 05/13/2021 7:31 AM Performed by: Gwyndolyn Saxon, CRNA Pre-anesthesia Checklist: Patient identified, Emergency Drugs available, Suction available and Patient being monitored Patient Re-evaluated:Patient Re-evaluated prior to induction Oxygen Delivery Method: Circle system utilized Preoxygenation: Pre-oxygenation with 100% oxygen Induction Type: IV induction Ventilation: Mask ventilation without difficulty and Oral airway inserted - appropriate to patient size LMA: LMA inserted LMA Size: 4.0 Number of attempts: 1 Placement Confirmation: positive ETCO2 and breath sounds checked- equal and bilateral Tube secured with: Tape Dental Injury: Teeth and Oropharynx as per pre-operative assessment

## 2021-05-13 NOTE — Progress Notes (Signed)
Successful port placement today.   Reviewed path for FISH results. Shared with Dr Marin Olp.   Oncology Nurse Navigator Documentation  Oncology Nurse Navigator Flowsheets 05/13/2021  Abnormal Finding Date -  Confirmed Diagnosis Date -  Diagnosis Status -  Navigator Follow Up Date: 05/17/2021  Navigator Follow Up Reason: Chemo Class  Navigator Location [facility]  Navigator Encounter Type Pathology Review;Appt/Treatment Plan Review  Telephone -  Patient Visit Type MedOnc  Treatment Phase Pre-Tx/Tx Discussion  Barriers/Navigation Needs Coordination of Care;Education  Education -  Interventions Coordination of Care  Acuity Level 2-Minimal Needs (1-2 Barriers Identified)  Coordination of Care Pathology  Education Method -  Support Groups/Services Friends and Family  Time Spent with Patient 15

## 2021-05-13 NOTE — Transfer of Care (Signed)
Immediate Anesthesia Transfer of Care Note  Patient: Aaron Fee Sr.  Procedure(s) Performed: INSERTION PORT-A-CATH (Right)  Patient Location: PACU  Anesthesia Type:General  Level of Consciousness: drowsy and patient cooperative  Airway & Oxygen Therapy: Patient Spontanous Breathing and Patient connected to face mask oxygen  Post-op Assessment: Report given to RN and Post -op Vital signs reviewed and stable  Post vital signs: Reviewed and stable  Last Vitals:  Vitals Value Taken Time  BP 109/69 05/13/21 0915  Temp 36.7 C 05/13/21 0830  Pulse 72 05/13/21 0919  Resp 23 05/13/21 0913  SpO2 92 % 05/13/21 0919  Vitals shown include unvalidated device data.  Last Pain:  Vitals:   05/13/21 0900  TempSrc:   PainSc: 0-No pain      Patients Stated Pain Goal: 3 (17/92/17 8375)  Complications: No notable events documented.

## 2021-05-13 NOTE — H&P (Signed)
Aaron Fee Sr. is an 66 y.o. male.    HPI: Aaron Weaver is a 66 yo male who presented with lymphadenopathy and recently underwent an axillary excisional lymph node biopsy. This confirmed a diagnosis of diffuse large B cell lymphoma. He will begin chemotherapy next week and presents today for port placement.  He still has some soreness in his right arm from his biopsy but this is improving. He has full range of motion of his arm.  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[place]) 05/04/2021   Dysrhythmia    GERD (gastroesophageal reflux disease)    Goals of care, counseling/discussion 05/04/2021   History of dehydration    Impacted cerumen    Impotence of organic origin    Lumbago    Lung nodule    Other and unspecified hyperlipidemia    Routine general medical examination at a health care facility    Skipped heart beats    Tobacco use disorder    Type II or unspecified type diabetes mellitus without mention of complication, not stated as uncontrolled    type II   Unspecified essential hypertension     Past Surgical History:  Procedure Laterality Date   ABDOMINAL AORTIC ENDOVASCULAR STENT GRAFT Bilateral 07/12/2020   Procedure: ABDOMINAL AORTIC ENDOVASCULAR STENT GRAFT;  Surgeon: [name], MD;  Location: [facility] OR;  Service: Vascular;  Laterality: Bilateral;   AXILLARY LYMPH NODE BIOPSY Right 04/27/2021   Procedure: AXILLARY EXCISIONAL LYMPH NODE BIOPSY;  Surgeon: Dwan Bolt, MD;  Location: [place];  Service: General;  Laterality: Right;   BACK SURGERY     fracture   CARPECTOMY Right 09/11/2019   Procedure:  PROXIMAL ROW CARPECTOMY; RADIAL STYLOIDECTOMY; POSTERIOR INTEROSSIUS NERVE RESECTION;  Surgeon: Daryll Brod, MD;  Location: [place];  Service: Orthopedics;  Laterality: Right;  AXILLARY BLOCK   COLONOSCOPY     INGUINAL HERNIA REPAIR     right   POLYPECTOMY     TONSILLECTOMY      Family History  Problem Relation Age of Onset   Colon cancer Mother 52   Hypertension Father    Coronary artery disease Other        male<60 and male<50  1st degree relative   Colon polyps Brother    Esophageal cancer Neg Hx    Rectal cancer Neg Hx    Stomach cancer Neg Hx    Social History:  reports that he quit smoking about 3 years ago. His smoking use included cigarettes. He started smoking about 56 years ago. He has a 26.50 pack-year smoking history. He has never used smokeless tobacco. He reports current alcohol use. He reports that he does not use drugs.  Allergies:  Allergies  Allergen Reactions   Fexofenadine Other (See Comments)    "dries me out too much"   Hydrochlorothiazide Other (See Comments)    REACTION: cramps   Penicillins Hives   Pravastatin Sodium Other (See Comments)    REACTION: aches   Requip [Ropinirole Hcl] Other (See Comments)  Bad dreams    Medications Prior to Admission  Medication Sig Dispense Refill   acetaminophen (TYLENOL) 325 MG tablet Take 650 mg by mouth as needed for mild pain, moderate pain or headache.     albuterol (PROAIR HFA) 108 (90 Base) MCG/ACT inhaler Inhale 1-2 puffs into the lungs every 6 (six) hours as needed for wheezing or shortness of breath. 8 g 6   alendronate (FOSAMAX) 70 MG tablet Take 70 mg by mouth every Sunday.     aspirin EC 81 MG EC tablet Take 1 tablet (81 mg total) by mouth daily at 6 (six) AM. Swallow whole. 30 tablet 11   atenolol (TENORMIN) 100 MG tablet Take 100 mg by mouth daily.     calcium carbonate (OSCAL) 1500 (600 Ca) MG TABS tablet Take 600 mg of elemental calcium by mouth every evening.     diclofenac  Sodium (VOLTAREN) 1 % GEL Apply 2 g topically daily as needed (pain).     DULoxetine (CYMBALTA) 60 MG capsule Take 60 mg by mouth at bedtime.     famciclovir (FAMVIR) 250 MG tablet Take 1 tablet (250 mg total) by mouth 2 (two) times daily. 30 tablet 8   Fish Oil OIL Take 1,500 mg by mouth daily.     glimepiride (AMARYL) 2 MG tablet Take 1 tablet (2 mg total) by mouth daily before breakfast. (Patient taking differently: Take 2 mg by mouth 2 (two) times daily.) 90 tablet 3   JANUMET XR 50-1000 MG TB24 Take 2 tablets by mouth at bedtime. 30 tablet    omeprazole (PRILOSEC) 40 MG capsule Take 40 mg by mouth daily.     potassium chloride SA (KLOR-CON M20) 20 MEQ tablet TAKE 1 TABLET (20 MEQ TOTAL) BY MOUTH DAILY.  (Patient taking differently: 20 mEq daily.) 30 tablet 5   rosuvastatin (CRESTOR) 10 MG tablet Take 10 mg by mouth daily.     tamsulosin (FLOMAX) 0.4 MG CAPS capsule Take 0.4 mg by mouth every evening.     Tiotropium Bromide Monohydrate (SPIRIVA RESPIMAT) 2.5 MCG/ACT AERS Inhale 2 puffs into the lungs daily. 4 g 0   Zinc 50 MG TABS Take 50 mg by mouth daily.     allopurinol (ZYLOPRIM) 100 MG tablet Take 1 tablet (100 mg total) by mouth daily. Start 3 days BEFORE chemotherapy starts 30 tablet 0   tadalafil (CIALIS) 5 MG tablet Take 5 mg by mouth daily.      Results for orders placed or performed during the hospital encounter of 05/13/21 (from the past 48 hour(s))  Glucose, capillary     Status: Abnormal   Collection Time: 05/13/21  6:19 AM  Result Value Ref Range   Glucose-Capillary 213 (H) 70 - 99 mg/dL    Comment: Glucose reference range applies only to samples taken after fasting for at least 8 hours.   DG BONE DENSITY (DXA)  Result Date: 05/11/2021 EXAM: DUAL X-RAY ABSORPTIOMETRY (DXA) FOR BONE MINERAL DENSITY IMPRESSION: Referring Physician:  [name] Your patient completed a bone mineral density test using GE Lunar iDXA system (analysis version: 16). Technologist: Magna PATIENT:  Name: Aaron Weaver, Aaron Weaver Patient ID: [id number] Birth Date: 1956-05-08 Height: 71.0 in. Sex: Male Measured: 05/11/2021 Weight: 198.0 lbs. Indications: Caucasian, Diabetic non insulin Fractures: Right Wrist, Vertebrae Treatments: Calcium (E943.0), Fosamax, Vitamin D (E933.5) ASSESSMENT: The BMD measured at Femur Neck Left is 0.920 g/cm2 with a T-score of -0.8. This patient is considered normal according to [facility]) criteria. The quality of  the exam is good. The lumbar spine was excluded due to degenerative and surgical changes. Site Region Measured Date Measured Age YA BMD Significant CHANGE T-score DualFemur Neck Left 05/11/2019 67.1 -0.6 0.920 g/cm2 DualFemur Neck Left 11/06/2018 64.6 -1.0 0.897 g/cm2 DualFemur Total Mean 05/11/2021 67.1 0.5 1.069 g/cm2 * DualFemur Total Mean 11/06/2018 64.6 0.1 1.017 g/cm2 Left Forearm Radius 33% 05/11/2021 67.1 -0.3 0.959 g/cm2 Left Forearm Radius 33% 11/06/2018 64.6 -0.6 0.947 g/cm2 World Health Organization [facility]) criteria for post-menopausal, Caucasian Women: Normal       T-score at or above -1 SD Osteopenia   T-score between -1 and -2.5 SD Osteoporosis T-score at or below -2.5 SD RECOMMENDATION: [place] recommends that FDA-approved medical therapies be considered in postmenopausal women and men age 70 or older with a: 1. Hip or vertebral (clinical or morphometric) fracture. 2. T-score of less than or equal to -2.5 at the spine or hip. 3. Ten-year fracture probability by FRAX of 3% or greater for hip fracture or 20% or greater for major osteoporotic fracture. All treatment decisions require clinical judgment and consideration of individual patient factors, including patient preferences, co-morbidities, previous drug use, risk factors not captured in the FRAX model (e.g. falls, vitamin D deficiency, increased bone turnover, interval significant decline in bone density) and possible under- or over-estimation of fracture risk by FRAX.  All patients should ensure an adequate intake of dietary calcium (1200 mg/d) and vitamin D (800 IU daily) unless contraindicated. FOLLOW-UP: People with diagnosed cases of osteoporosis or osteopenia should be regularly tested for bone mineral density. For patients eligible for Medicare, routine testing is allowed once every 2 years. The testing frequency can be increased to one year for patients who have rapidly progressing disease, or for those who are receiving medical therapy to restore bone mass. I have reviewed this study and agree with the findings. [facility] Electronically Signed   By: Elmer Picker M.D.   On: 05/11/2021 08:42    Review of Systems  Constitutional:  Negative for fever.   Blood pressure 115/88, pulse 73, temperature 98.1 F (36.7 C), temperature source Oral, resp. rate 18, height 6' (1.829 m), weight 89.8 kg, SpO2 94 %. Physical Exam Constitutional:      General: He is not in acute distress.    Appearance: Normal appearance.  Eyes:     Conjunctiva/sclera: Conjunctivae normal.  Pulmonary:     Effort: Pulmonary effort is normal. No respiratory distress.  Musculoskeletal:        General: Normal range of motion.     Cervical back: Normal range of motion.     Comments: Right axillary incision is healing well with no erythema, but there is some underlying swelling consistent with a seroma.  Skin:    General: Skin is warm and dry.     Coloration: Skin is not jaundiced.  Neurological:     General: No focal deficit present.     Mental Status: He is alert and oriented to person, place, and time.  Psychiatric:        Mood and Affect: Mood normal.        Behavior: Behavior normal.        Thought Content: Thought content normal.     Assessment/Plan 66 yo male with newly-diagnosed lymphoma, presenting for port placement. I discussed the details of the procedure and the benefits and risks, including bleeding, infection and pneumothorax. He agrees to  proceed. His axillary incision is healing although he does appear to have  a seroma. As it has no signs of infection I do not recommend draining this.  Proceed to OR, plan for CXR in PACU and discharge home.  Dwan Bolt, MD 05/13/2021, 7:09 AM

## 2021-05-15 ENCOUNTER — Encounter (HOSPITAL_COMMUNITY): Payer: Self-pay | Admitting: Surgery

## 2021-05-16 ENCOUNTER — Other Ambulatory Visit: Payer: Self-pay | Admitting: *Deleted

## 2021-05-16 DIAGNOSIS — C8338 Diffuse large B-cell lymphoma, lymph nodes of multiple sites: Secondary | ICD-10-CM

## 2021-05-16 MED ORDER — PREDNISONE 20 MG PO TABS: 60.0000 mg | ORAL_TABLET | Freq: Every day | ORAL | 5 refills | Status: DC

## 2021-05-16 MED ORDER — ONDANSETRON HCL 8 MG PO TABS: 8.0000 mg | ORAL_TABLET | Freq: Two times a day (BID) | ORAL | 1 refills | Status: DC | PRN

## 2021-05-16 MED ORDER — PROCHLORPERAZINE MALEATE 10 MG PO TABS: 10.0000 mg | ORAL_TABLET | Freq: Four times a day (QID) | ORAL | 6 refills | Status: DC | PRN

## 2021-05-16 MED ORDER — LIDOCAINE-PRILOCAINE 2.5-2.5 % EX CREA: TOPICAL_CREAM | CUTANEOUS | 3 refills | Status: DC

## 2021-05-17 ENCOUNTER — Other Ambulatory Visit: Payer: Self-pay | Admitting: *Deleted

## 2021-05-17 ENCOUNTER — Encounter: Payer: Self-pay | Admitting: *Deleted

## 2021-05-17 ENCOUNTER — Other Ambulatory Visit (HOSPITAL_BASED_OUTPATIENT_CLINIC_OR_DEPARTMENT_OTHER): Payer: Self-pay

## 2021-05-17 ENCOUNTER — Encounter: Payer: Self-pay | Admitting: Hematology & Oncology

## 2021-05-17 ENCOUNTER — Other Ambulatory Visit: Payer: Self-pay

## 2021-05-17 ENCOUNTER — Inpatient Hospital Stay: Payer: Medicare Other | Attending: Physician Assistant

## 2021-05-17 DIAGNOSIS — Z87891 Personal history of nicotine dependence: Secondary | ICD-10-CM | POA: Insufficient documentation

## 2021-05-17 DIAGNOSIS — C8338 Diffuse large B-cell lymphoma, lymph nodes of multiple sites: Secondary | ICD-10-CM

## 2021-05-17 DIAGNOSIS — Z5112 Encounter for antineoplastic immunotherapy: Secondary | ICD-10-CM | POA: Insufficient documentation

## 2021-05-17 DIAGNOSIS — I714 Abdominal aortic aneurysm, without rupture, unspecified: Secondary | ICD-10-CM | POA: Insufficient documentation

## 2021-05-17 DIAGNOSIS — Z5189 Encounter for other specified aftercare: Secondary | ICD-10-CM | POA: Insufficient documentation

## 2021-05-17 DIAGNOSIS — C8330 Diffuse large B-cell lymphoma, unspecified site: Secondary | ICD-10-CM | POA: Insufficient documentation

## 2021-05-17 DIAGNOSIS — Z5111 Encounter for antineoplastic chemotherapy: Secondary | ICD-10-CM | POA: Insufficient documentation

## 2021-05-17 MED ORDER — ONDANSETRON HCL 8 MG PO TABS
8.0000 mg | ORAL_TABLET | Freq: Two times a day (BID) | ORAL | 1 refills | Status: DC | PRN
  Filled 2021-05-17 (×2): qty 30, 15d supply, fill #0
  Filled 2021-10-17: qty 30, 15d supply, fill #1

## 2021-05-17 MED ORDER — PREDNISONE 20 MG PO TABS
60.0000 mg | ORAL_TABLET | Freq: Every day | ORAL | 5 refills | Status: DC
  Filled 2021-05-17: qty 15, 5d supply, fill #0

## 2021-05-17 MED ORDER — LORAZEPAM 0.5 MG PO TABS
0.5000 mg | ORAL_TABLET | Freq: Three times a day (TID) | ORAL | 1 refills | Status: DC
  Filled 2021-05-17: qty 30, 10d supply, fill #0
  Filled 2021-10-16: qty 30, 10d supply, fill #1

## 2021-05-17 MED ORDER — FAMCICLOVIR 250 MG PO TABS
250.0000 mg | ORAL_TABLET | Freq: Two times a day (BID) | ORAL | 8 refills | Status: DC
  Filled 2021-05-17: qty 30, 15d supply, fill #0
  Filled 2021-05-29: qty 30, 15d supply, fill #1

## 2021-05-17 MED ORDER — PROCHLORPERAZINE MALEATE 10 MG PO TABS
10.0000 mg | ORAL_TABLET | Freq: Four times a day (QID) | ORAL | 6 refills | Status: DC | PRN
  Filled 2021-05-17 (×2): qty 30, 8d supply, fill #0
  Filled 2021-10-17: qty 30, 8d supply, fill #1

## 2021-05-17 NOTE — Progress Notes (Signed)
Patient here for chemo education. First treatment scheduled for Friday, January 13th.  Oncology Nurse Navigator Documentation  Oncology Nurse Navigator Flowsheets 05/17/2021  Abnormal Finding Date -  Confirmed Diagnosis Date -  Diagnosis Status -  Navigator Follow Up Date: 05/20/2021  Navigator Follow Up Reason: Chemotherapy  Navigator Location [facility]  Navigator Encounter Type Education  Telephone -  Patient Visit Type MedOnc  Treatment Phase Pre-Tx/Tx Discussion  Barriers/Navigation Needs Coordination of Care;Education  Education -  Interventions None Required  Acuity Level 2-Minimal Needs (1-2 Barriers Identified)  Coordination of Care -  Education Method -  Support Groups/Services Friends and Family  Time Spent with Patient 15

## 2021-05-17 NOTE — Progress Notes (Signed)
Patient in chemotherapy education class with  Wife Butch Penny.  Discussed side effects of Rituxan, Cytoxan, Vincristine, Adriamycin which include but are not limited to myelosuppression, decreased appetite, fatigue, fever, allergic or infusional reaction, mucositis, cardiac toxicity, cough, SOB, altered taste, nausea and vomiting, diarrhea, constipation, elevated LFTs myalgia and arthralgias, hair loss or thinning, rash, skin dryness, nail changes, peripheral neuropathy, discolored urine, delayed wound healing, mental changes (Chemo brain), increased risk of infections, weight loss.  Reviewed infusion room and office policy and procedure and phone numbers 24 hours x 7 days a week.  Reviewed ambulatory pump specifics and how to manage safe handling at home.  Reviewed when to call the office with any concerns or problems.  Scientist, clinical (histocompatibility and immunogenetics) given.  Discussed portacath insertion and EMLA cream administration.  Antiemetic protocol and chemotherapy schedule reviewed. Patient verbalized understanding of chemotherapy indications and possible side effects.  Teachback done

## 2021-05-19 ENCOUNTER — Ambulatory Visit (HOSPITAL_COMMUNITY)
Admission: RE | Admit: 2021-05-19 | Discharge: 2021-05-19 | Disposition: A | Discharge status: Home / Self Care | Payer: Medicare Other | Source: Ambulatory Visit | Attending: Hematology & Oncology | Admitting: Hematology & Oncology

## 2021-05-19 ENCOUNTER — Other Ambulatory Visit: Payer: Self-pay

## 2021-05-19 DIAGNOSIS — C8338 Diffuse large B-cell lymphoma, lymph nodes of multiple sites: Secondary | ICD-10-CM | POA: Insufficient documentation

## 2021-05-19 DIAGNOSIS — I10 Essential (primary) hypertension: Secondary | ICD-10-CM | POA: Insufficient documentation

## 2021-05-19 DIAGNOSIS — Z0189 Encounter for other specified special examinations: Secondary | ICD-10-CM | POA: Diagnosis not present

## 2021-05-19 DIAGNOSIS — F17200 Nicotine dependence, unspecified, uncomplicated: Secondary | ICD-10-CM | POA: Diagnosis not present

## 2021-05-19 DIAGNOSIS — E785 Hyperlipidemia, unspecified: Secondary | ICD-10-CM | POA: Insufficient documentation

## 2021-05-19 DIAGNOSIS — Z01818 Encounter for other preprocedural examination: Secondary | ICD-10-CM | POA: Diagnosis not present

## 2021-05-19 DIAGNOSIS — I519 Heart disease, unspecified: Secondary | ICD-10-CM | POA: Diagnosis not present

## 2021-05-19 DIAGNOSIS — E119 Type 2 diabetes mellitus without complications: Secondary | ICD-10-CM | POA: Diagnosis not present

## 2021-05-19 DIAGNOSIS — J449 Chronic obstructive pulmonary disease, unspecified: Secondary | ICD-10-CM | POA: Diagnosis not present

## 2021-05-19 LAB — ECHOCARDIOGRAM COMPLETE
AR max vel: 3.41 cm2
AV Area VTI: 3.22 cm2
AV Area mean vel: 3.52 cm2
AV Mean grad: 3 mmHg
AV Peak grad: 5.5 mmHg
Ao pk vel: 1.17 m/s
Area-P 1/2: 2.73 cm2
Calc EF: 59.7 %
S' Lateral: 2.9 cm
Single Plane A2C EF: 57.7 %
Single Plane A4C EF: 62.7 %

## 2021-05-19 NOTE — Progress Notes (Signed)
°  Echocardiogram 2D Echocardiogram has been performed.  Aaron Weaver 05/19/2021, 10:20 AM

## 2021-05-20 ENCOUNTER — Inpatient Hospital Stay: Payer: Medicare Other

## 2021-05-20 ENCOUNTER — Other Ambulatory Visit (HOSPITAL_BASED_OUTPATIENT_CLINIC_OR_DEPARTMENT_OTHER): Payer: Self-pay

## 2021-05-20 ENCOUNTER — Encounter: Payer: Self-pay | Admitting: *Deleted

## 2021-05-20 VITALS — BP 118/61 | HR 75 | Temp 98.4°F | Resp 16

## 2021-05-20 DIAGNOSIS — Z87891 Personal history of nicotine dependence: Secondary | ICD-10-CM | POA: Diagnosis not present

## 2021-05-20 DIAGNOSIS — Z5112 Encounter for antineoplastic immunotherapy: Secondary | ICD-10-CM | POA: Diagnosis not present

## 2021-05-20 DIAGNOSIS — Z5111 Encounter for antineoplastic chemotherapy: Secondary | ICD-10-CM | POA: Diagnosis not present

## 2021-05-20 DIAGNOSIS — Z5189 Encounter for other specified aftercare: Secondary | ICD-10-CM | POA: Diagnosis not present

## 2021-05-20 DIAGNOSIS — I714 Abdominal aortic aneurysm, without rupture, unspecified: Secondary | ICD-10-CM | POA: Diagnosis not present

## 2021-05-20 DIAGNOSIS — C8338 Diffuse large B-cell lymphoma, lymph nodes of multiple sites: Secondary | ICD-10-CM

## 2021-05-20 DIAGNOSIS — C8330 Diffuse large B-cell lymphoma, unspecified site: Secondary | ICD-10-CM | POA: Diagnosis not present

## 2021-05-20 LAB — CMP (CANCER CENTER ONLY)
ALT: 35 U/L (ref 0–44)
AST: 34 U/L (ref 15–41)
Albumin: 3.7 g/dL (ref 3.5–5.0)
Alkaline Phosphatase: 69 U/L (ref 38–126)
Anion gap: 9 (ref 5–15)
BUN: 22 mg/dL (ref 8–23)
CO2: 23 mmol/L (ref 22–32)
Calcium: 9 mg/dL (ref 8.9–10.3)
Chloride: 103 mmol/L (ref 98–111)
Creatinine: 0.87 mg/dL (ref 0.61–1.24)
GFR, Estimated: >60 mL/min (ref >60)
Glucose, Bld: 338 mg/dL — ABNORMAL HIGH (ref 70–99)
Potassium: 4.1 mmol/L (ref 3.5–5.1)
Sodium: 135 mmol/L (ref 135–145)
Total Bilirubin: 0.8 mg/dL (ref 0.3–1.2)
Total Protein: 6.5 g/dL (ref 6.5–8.1)

## 2021-05-20 LAB — CBC WITH DIFFERENTIAL (CANCER CENTER ONLY)
Abs Immature Granulocytes: 0.02 10*3/uL (ref 0.00–0.07)
Basophils Absolute: 0 10*3/uL (ref 0.0–0.1)
Basophils Relative: 0 %
Eosinophils Absolute: 0.1 10*3/uL (ref 0.0–0.5)
Eosinophils Relative: 1 %
HCT: 37.9 % — ABNORMAL LOW (ref 39.0–52.0)
Hemoglobin: 12.9 g/dL — ABNORMAL LOW (ref 13.0–17.0)
Immature Granulocytes: 0 %
Lymphocytes Relative: 9 %
Lymphs Abs: 0.4 10*3/uL — ABNORMAL LOW (ref 0.7–4.0)
MCH: 28.6 pg (ref 26.0–34.0)
MCHC: 34 g/dL (ref 30.0–36.0)
MCV: 84 fL (ref 80.0–100.0)
Monocytes Absolute: 0.4 10*3/uL (ref 0.1–1.0)
Monocytes Relative: 8 %
Neutro Abs: 3.6 10*3/uL (ref 1.7–7.7)
Neutrophils Relative %: 82 %
Platelet Count: 199 10*3/uL (ref 150–400)
RBC: 4.51 MIL/uL (ref 4.22–5.81)
RDW: 13.4 % (ref 11.5–15.5)
WBC Count: 4.5 10*3/uL (ref 4.0–10.5)
nRBC: 0 % (ref 0.0–0.2)

## 2021-05-20 LAB — HEPATITIS PANEL, ACUTE
HCV Ab: NONREACTIVE
Hep A IgM: NONREACTIVE
Hep B C IgM: NONREACTIVE
Hepatitis B Surface Ag: NONREACTIVE

## 2021-05-20 LAB — LACTATE DEHYDROGENASE: LDH: 195 U/L — ABNORMAL HIGH (ref 98–192)

## 2021-05-20 MED ORDER — ACETAMINOPHEN 325 MG PO TABS
650.0000 mg | ORAL_TABLET | Freq: Once | ORAL | Status: AC
  Administered 2021-05-20: 650 mg via ORAL
  Filled 2021-05-20: qty 2

## 2021-05-20 MED ORDER — PALONOSETRON HCL INJECTION 0.25 MG/5ML
0.2500 mg | Freq: Once | INTRAVENOUS | Status: AC
  Administered 2021-05-20: 0.25 mg via INTRAVENOUS
  Filled 2021-05-20: qty 5

## 2021-05-20 MED ORDER — SODIUM CHLORIDE 0.9 % IV SOLN: Freq: Once | INTRAVENOUS | Status: AC

## 2021-05-20 MED ORDER — SODIUM CHLORIDE 0.9 % IV SOLN
150.0000 mg | Freq: Once | INTRAVENOUS | Status: AC
  Administered 2021-05-20: 150 mg via INTRAVENOUS
  Filled 2021-05-20: qty 150

## 2021-05-20 MED ORDER — SODIUM CHLORIDE 0.9 % IV SOLN
375.0000 mg/m2 | Freq: Once | INTRAVENOUS | Status: AC
  Administered 2021-05-20: 800 mg via INTRAVENOUS
  Filled 2021-05-20: qty 50

## 2021-05-20 MED ORDER — SODIUM CHLORIDE 0.9 % IV SOLN
750.0000 mg/m2 | Freq: Once | INTRAVENOUS | Status: AC
  Administered 2021-05-20: 1600 mg via INTRAVENOUS
  Filled 2021-05-20: qty 80

## 2021-05-20 MED ORDER — DIPHENHYDRAMINE HCL 25 MG PO CAPS
50.0000 mg | ORAL_CAPSULE | Freq: Once | ORAL | Status: AC
  Administered 2021-05-20: 50 mg via ORAL
  Filled 2021-05-20: qty 2

## 2021-05-20 MED ORDER — SODIUM CHLORIDE 0.9 % IV SOLN
10.0000 mg | Freq: Once | INTRAVENOUS | Status: AC
  Administered 2021-05-20: 10 mg via INTRAVENOUS
  Filled 2021-05-20: qty 10

## 2021-05-20 MED ORDER — VINCRISTINE SULFATE CHEMO INJECTION 1 MG/ML
2.0000 mg | Freq: Once | INTRAVENOUS | Status: AC
  Administered 2021-05-20: 2 mg via INTRAVENOUS
  Filled 2021-05-20: qty 2

## 2021-05-20 MED ORDER — DOXORUBICIN HCL CHEMO IV INJECTION 2 MG/ML
50.0000 mg/m2 | Freq: Once | INTRAVENOUS | Status: AC
  Administered 2021-05-20: 106 mg via INTRAVENOUS
  Filled 2021-05-20: qty 53

## 2021-05-20 MED ORDER — MEPERIDINE HCL 25 MG/ML IJ SOLN
25.0000 mg | Freq: Once | INTRAMUSCULAR | Status: DC
  Filled 2021-05-20: qty 1

## 2021-05-20 MED ORDER — SODIUM CHLORIDE 0.9% FLUSH
10.0000 mL | INTRAVENOUS | Status: DC | PRN
  Administered 2021-05-20: 10 mL

## 2021-05-20 MED ORDER — HEPARIN SOD (PORK) LOCK FLUSH 100 UNIT/ML IV SOLN
500.0000 [IU] | Freq: Once | INTRAVENOUS | Status: AC | PRN
  Administered 2021-05-20: 500 [IU]

## 2021-05-20 NOTE — Progress Notes (Signed)
Met with patient in infusion prior to initiation of treatment. He feels that he's ready to start but admittedly nervous. He also appears anxious while talking to him.   Reviewed his chemo teaching. He has no questions. Reviewed the on-call service and instructed him to call the office with any questions or concerns that may arise this weekend. He understood.   Oncology Nurse Navigator Documentation  Oncology Nurse Navigator Flowsheets 05/20/2021  Abnormal Finding Date -  Confirmed Diagnosis Date -  Diagnosis Status -  Planned Course of Treatment Chemotherapy  Phase of Treatment Chemo  Chemotherapy Actual Start Date: 05/20/2021  Chemotherapy Expected End Date: 09/02/2021  Navigator Follow Up Date: 05/23/2021  Navigator Follow Up Reason: Patient Call  Navigator Location [facility]  Navigator Encounter Type Treatment  Telephone -  Patient Visit Type MedOnc  Treatment Phase First Chemo Tx  Barriers/Navigation Needs Coordination of Care;Education  Education Other  Interventions Education;Psycho-Social Support  Acuity Level 2-Minimal Needs (1-2 Barriers Identified)  Coordination of Care -  Education Method Verbal  Support Groups/Services Friends and Family  Time Spent with Patient 30

## 2021-05-20 NOTE — Patient Instructions (Signed)
[facility]  Discharge Instructions: Thank you for choosing [facility] to provide your oncology and hematology care.   If you have a lab appointment with the [facility], please go directly to the [facility] and check in at the registration area.  Wear comfortable clothing and clothing appropriate for easy access to any Portacath or PICC line.   We strive to give you quality time with your provider. You may need to reschedule your appointment if you arrive late (15 or more minutes).  Arriving late affects you and other patients whose appointments are after yours.  Also, if you miss three or more appointments without notifying the office, you may be dismissed from the clinic at the providers discretion.      For prescription refill requests, have your pharmacy contact our office and allow 72 hours for refills to be completed.    Today you received the following chemotherapy and/or immunotherapy agents Adriamycin, Vincristine, Cytoxan, Rituxan      To help prevent nausea and vomiting after your treatment, we encourage you to take your nausea medication as directed.  BELOW ARE SYMPTOMS THAT SHOULD BE REPORTED IMMEDIATELY: *FEVER GREATER THAN 100.4 F (38 C) OR HIGHER *CHILLS OR SWEATING *NAUSEA AND VOMITING THAT IS NOT CONTROLLED WITH YOUR NAUSEA MEDICATION *UNUSUAL SHORTNESS OF BREATH *UNUSUAL BRUISING OR BLEEDING *URINARY PROBLEMS (pain or burning when urinating, or frequent urination) *BOWEL PROBLEMS (unusual diarrhea, constipation, pain near the anus) TENDERNESS IN MOUTH AND THROAT WITH OR WITHOUT PRESENCE OF ULCERS (sore throat, sores in mouth, or a toothache) UNUSUAL RASH, SWELLING OR PAIN  UNUSUAL VAGINAL DISCHARGE OR ITCHING   Items with * indicate a potential emergency and should be followed up as soon as possible or go to the Emergency Department if any problems should occur.  Please show the CHEMOTHERAPY ALERT CARD or IMMUNOTHERAPY  ALERT CARD at check-in to the Emergency Department and triage nurse. Should you have questions after your visit or need to cancel or reschedule your appointment, please contact [facility]  [phone] and follow the prompts.  Office hours are 8:00 a.m. to 4:30 p.m. Monday - Friday. Please note that voicemails left after 4:00 p.m. may not be returned until the following business day.  We are closed weekends and major holidays. You have access to a nurse at all times for urgent questions. Please call the main number to the clinic [phone] and follow the prompts.  For any non-urgent questions, you may also contact your provider using MyChart. We now offer e-Visits for anyone 68 and older to request care online for non-urgent symptoms. For details visit [email or web address].   Also download the MyChart app! Go to the app store, search "MyChart", open the app, select [facility], and log in with your MyChart username and password.  Due to Covid, a mask is required upon entering the hospital/clinic. If you do not have a mask, one will be given to you upon arrival. For doctor visits, patients may have 1 support person aged 68 or older with them. For treatment visits, patients cannot have anyone with them due to current Covid guidelines and our immunocompromised population.

## 2021-05-20 NOTE — Patient Instructions (Signed)

## 2021-05-23 ENCOUNTER — Inpatient Hospital Stay: Payer: Medicare Other

## 2021-05-23 ENCOUNTER — Encounter: Payer: Self-pay | Admitting: *Deleted

## 2021-05-23 ENCOUNTER — Other Ambulatory Visit: Payer: Self-pay

## 2021-05-23 VITALS — BP 126/80 | HR 66 | Temp 98.0°F | Resp 16

## 2021-05-23 DIAGNOSIS — Z5112 Encounter for antineoplastic immunotherapy: Secondary | ICD-10-CM | POA: Diagnosis not present

## 2021-05-23 DIAGNOSIS — Z5189 Encounter for other specified aftercare: Secondary | ICD-10-CM | POA: Diagnosis not present

## 2021-05-23 DIAGNOSIS — Z87891 Personal history of nicotine dependence: Secondary | ICD-10-CM | POA: Diagnosis not present

## 2021-05-23 DIAGNOSIS — I714 Abdominal aortic aneurysm, without rupture, unspecified: Secondary | ICD-10-CM | POA: Diagnosis not present

## 2021-05-23 DIAGNOSIS — C8330 Diffuse large B-cell lymphoma, unspecified site: Secondary | ICD-10-CM | POA: Diagnosis not present

## 2021-05-23 DIAGNOSIS — C8338 Diffuse large B-cell lymphoma, lymph nodes of multiple sites: Secondary | ICD-10-CM

## 2021-05-23 DIAGNOSIS — Z5111 Encounter for antineoplastic chemotherapy: Secondary | ICD-10-CM | POA: Diagnosis not present

## 2021-05-23 MED ORDER — PEGFILGRASTIM-BMEZ 6 MG/0.6ML ~~LOC~~ SOSY
6.0000 mg | PREFILLED_SYRINGE | Freq: Once | SUBCUTANEOUS | Status: AC
  Administered 2021-05-23: 6 mg via SUBCUTANEOUS
  Filled 2021-05-23: qty 0.6

## 2021-05-23 NOTE — Progress Notes (Signed)
Patient has done very well over the weekend. He has no complaints. He says he has had no side effects or symptoms. He is here today for his Neulasta injection. Patient confirms that he has home medication to take as needed. He is aware of his next appointment. Encouraged him to call the office with any questions or concerns.   Oncology Nurse Navigator Documentation  Oncology Nurse Navigator Flowsheets 05/23/2021  Abnormal Finding Date -  Confirmed Diagnosis Date -  Diagnosis Status -  Planned Course of Treatment -  Phase of Treatment -  Chemotherapy Actual Start Date: -  Chemotherapy Expected End Date: -  Navigator Follow Up Date: 06/08/2021  Navigator Follow Up Reason: Follow-up Appointment;Chemotherapy  Navigator Restaurant manager, fast food Encounter Type Treatment  Telephone -  Treatment Initiated Date 05/20/2021  Patient Visit Type MedOnc  Treatment Phase Active Tx  Barriers/Navigation Needs Coordination of Care;Education  Education Pain/ Symptom Management  Interventions Education;Psycho-Social Support  Acuity Level 2-Minimal Needs (1-2 Barriers Identified)  Coordination of Care -  Education Method Verbal  Support Groups/Services Friends and Family  Time Spent with Patient 30

## 2021-05-23 NOTE — Patient Instructions (Signed)

## 2021-05-25 LAB — SURGICAL PATHOLOGY

## 2021-05-27 ENCOUNTER — Other Ambulatory Visit (HOSPITAL_COMMUNITY): Payer: Self-pay | Admitting: Physician Assistant

## 2021-05-29 ENCOUNTER — Emergency Department (HOSPITAL_COMMUNITY): Payer: Medicare Other

## 2021-05-29 ENCOUNTER — Other Ambulatory Visit: Payer: Self-pay

## 2021-05-29 ENCOUNTER — Inpatient Hospital Stay (HOSPITAL_COMMUNITY)
Admission: EM | Admit: 2021-05-29 | Discharge: 2021-06-08 | DRG: 871 | Disposition: A | Discharge status: Home Health | Payer: Medicare Other | Attending: Internal Medicine | Admitting: Internal Medicine

## 2021-05-29 ENCOUNTER — Encounter (HOSPITAL_COMMUNITY): Payer: Self-pay | Admitting: Internal Medicine

## 2021-05-29 DIAGNOSIS — Z794 Long term (current) use of insulin: Secondary | ICD-10-CM

## 2021-05-29 DIAGNOSIS — E871 Hypo-osmolality and hyponatremia: Secondary | ICD-10-CM | POA: Diagnosis present

## 2021-05-29 DIAGNOSIS — F32A Depression, unspecified: Secondary | ICD-10-CM | POA: Diagnosis present

## 2021-05-29 DIAGNOSIS — R109 Unspecified abdominal pain: Secondary | ICD-10-CM | POA: Diagnosis not present

## 2021-05-29 DIAGNOSIS — R338 Other retention of urine: Secondary | ICD-10-CM

## 2021-05-29 DIAGNOSIS — Z880 Allergy status to penicillin: Secondary | ICD-10-CM

## 2021-05-29 DIAGNOSIS — D61810 Antineoplastic chemotherapy induced pancytopenia: Secondary | ICD-10-CM | POA: Diagnosis not present

## 2021-05-29 DIAGNOSIS — L02411 Cutaneous abscess of right axilla: Secondary | ICD-10-CM | POA: Diagnosis not present

## 2021-05-29 DIAGNOSIS — R6510 Systemic inflammatory response syndrome (SIRS) of non-infectious origin without acute organ dysfunction: Secondary | ICD-10-CM | POA: Diagnosis not present

## 2021-05-29 DIAGNOSIS — Z87891 Personal history of nicotine dependence: Secondary | ICD-10-CM

## 2021-05-29 DIAGNOSIS — I083 Combined rheumatic disorders of mitral, aortic and tricuspid valves: Secondary | ICD-10-CM | POA: Diagnosis not present

## 2021-05-29 DIAGNOSIS — B9562 Methicillin resistant Staphylococcus aureus infection as the cause of diseases classified elsewhere: Secondary | ICD-10-CM | POA: Diagnosis not present

## 2021-05-29 DIAGNOSIS — L0291 Cutaneous abscess, unspecified: Secondary | ICD-10-CM

## 2021-05-29 DIAGNOSIS — J439 Emphysema, unspecified: Secondary | ICD-10-CM | POA: Diagnosis not present

## 2021-05-29 DIAGNOSIS — R197 Diarrhea, unspecified: Secondary | ICD-10-CM | POA: Diagnosis not present

## 2021-05-29 DIAGNOSIS — E860 Dehydration: Secondary | ICD-10-CM | POA: Diagnosis not present

## 2021-05-29 DIAGNOSIS — Z79899 Other long term (current) drug therapy: Secondary | ICD-10-CM

## 2021-05-29 DIAGNOSIS — K219 Gastro-esophageal reflux disease without esophagitis: Secondary | ICD-10-CM | POA: Diagnosis present

## 2021-05-29 DIAGNOSIS — I480 Paroxysmal atrial fibrillation: Secondary | ICD-10-CM | POA: Diagnosis not present

## 2021-05-29 DIAGNOSIS — M79621 Pain in right upper arm: Secondary | ICD-10-CM

## 2021-05-29 DIAGNOSIS — T827XXA Infection and inflammatory reaction due to other cardiac and vascular devices, implants and grafts, initial encounter: Secondary | ICD-10-CM | POA: Diagnosis not present

## 2021-05-29 DIAGNOSIS — R7881 Bacteremia: Secondary | ICD-10-CM | POA: Diagnosis not present

## 2021-05-29 DIAGNOSIS — R339 Retention of urine, unspecified: Secondary | ICD-10-CM | POA: Diagnosis present

## 2021-05-29 DIAGNOSIS — U071 COVID-19: Secondary | ICD-10-CM | POA: Diagnosis not present

## 2021-05-29 DIAGNOSIS — I4891 Unspecified atrial fibrillation: Secondary | ICD-10-CM

## 2021-05-29 DIAGNOSIS — C8590 Non-Hodgkin lymphoma, unspecified, unspecified site: Secondary | ICD-10-CM | POA: Diagnosis not present

## 2021-05-29 DIAGNOSIS — Z8249 Family history of ischemic heart disease and other diseases of the circulatory system: Secondary | ICD-10-CM

## 2021-05-29 DIAGNOSIS — Z8342 Family history of familial hypercholesterolemia: Secondary | ICD-10-CM | POA: Diagnosis not present

## 2021-05-29 DIAGNOSIS — E119 Type 2 diabetes mellitus without complications: Secondary | ICD-10-CM

## 2021-05-29 DIAGNOSIS — A4102 Sepsis due to Methicillin resistant Staphylococcus aureus: Principal | ICD-10-CM | POA: Diagnosis present

## 2021-05-29 DIAGNOSIS — D701 Agranulocytosis secondary to cancer chemotherapy: Secondary | ICD-10-CM | POA: Diagnosis present

## 2021-05-29 DIAGNOSIS — A419 Sepsis, unspecified organism: Secondary | ICD-10-CM | POA: Diagnosis present

## 2021-05-29 DIAGNOSIS — T451X5A Adverse effect of antineoplastic and immunosuppressive drugs, initial encounter: Secondary | ICD-10-CM | POA: Diagnosis not present

## 2021-05-29 DIAGNOSIS — C8510 Unspecified B-cell lymphoma, unspecified site: Secondary | ICD-10-CM | POA: Diagnosis not present

## 2021-05-29 DIAGNOSIS — J449 Chronic obstructive pulmonary disease, unspecified: Secondary | ICD-10-CM | POA: Diagnosis not present

## 2021-05-29 DIAGNOSIS — E785 Hyperlipidemia, unspecified: Secondary | ICD-10-CM | POA: Diagnosis not present

## 2021-05-29 DIAGNOSIS — R918 Other nonspecific abnormal finding of lung field: Secondary | ICD-10-CM | POA: Diagnosis not present

## 2021-05-29 DIAGNOSIS — B9561 Methicillin susceptible Staphylococcus aureus infection as the cause of diseases classified elsewhere: Secondary | ICD-10-CM | POA: Diagnosis not present

## 2021-05-29 DIAGNOSIS — D509 Iron deficiency anemia, unspecified: Secondary | ICD-10-CM | POA: Diagnosis not present

## 2021-05-29 DIAGNOSIS — R55 Syncope and collapse: Secondary | ICD-10-CM | POA: Diagnosis not present

## 2021-05-29 DIAGNOSIS — Z7983 Long term (current) use of bisphosphonates: Secondary | ICD-10-CM

## 2021-05-29 DIAGNOSIS — N179 Acute kidney failure, unspecified: Secondary | ICD-10-CM | POA: Diagnosis not present

## 2021-05-29 DIAGNOSIS — I714 Abdominal aortic aneurysm, without rupture, unspecified: Secondary | ICD-10-CM | POA: Diagnosis present

## 2021-05-29 DIAGNOSIS — R6520 Severe sepsis without septic shock: Secondary | ICD-10-CM | POA: Diagnosis not present

## 2021-05-29 DIAGNOSIS — Z7982 Long term (current) use of aspirin: Secondary | ICD-10-CM

## 2021-05-29 DIAGNOSIS — C8338 Diffuse large B-cell lymphoma, lymph nodes of multiple sites: Secondary | ICD-10-CM | POA: Diagnosis present

## 2021-05-29 DIAGNOSIS — D61818 Other pancytopenia: Secondary | ICD-10-CM | POA: Diagnosis not present

## 2021-05-29 DIAGNOSIS — I700 Atherosclerosis of aorta: Secondary | ICD-10-CM | POA: Diagnosis not present

## 2021-05-29 DIAGNOSIS — F419 Anxiety disorder, unspecified: Secondary | ICD-10-CM | POA: Diagnosis present

## 2021-05-29 DIAGNOSIS — I10 Essential (primary) hypertension: Secondary | ICD-10-CM | POA: Diagnosis present

## 2021-05-29 DIAGNOSIS — E1165 Type 2 diabetes mellitus with hyperglycemia: Secondary | ICD-10-CM | POA: Diagnosis present

## 2021-05-29 DIAGNOSIS — C8330 Diffuse large B-cell lymphoma, unspecified site: Secondary | ICD-10-CM

## 2021-05-29 DIAGNOSIS — Z888 Allergy status to other drugs, medicaments and biological substances status: Secondary | ICD-10-CM

## 2021-05-29 LAB — CBC WITH DIFFERENTIAL/PLATELET
Abs Immature Granulocytes: 0 10*3/uL (ref 0.00–0.07)
Basophils Absolute: 0 10*3/uL (ref 0.0–0.1)
Basophils Relative: 3 %
Eosinophils Absolute: 0 10*3/uL (ref 0.0–0.5)
Eosinophils Relative: 0 %
HCT: 37.9 % — ABNORMAL LOW (ref 39.0–52.0)
Hemoglobin: 12.8 g/dL — ABNORMAL LOW (ref 13.0–17.0)
Immature Granulocytes: 0 %
Lymphocytes Relative: 30 %
Lymphs Abs: 0.1 10*3/uL — ABNORMAL LOW (ref 0.7–4.0)
MCH: 28 pg (ref 26.0–34.0)
MCHC: 33.8 g/dL (ref 30.0–36.0)
MCV: 82.9 fL (ref 80.0–100.0)
Monocytes Absolute: 0.1 10*3/uL (ref 0.1–1.0)
Monocytes Relative: 23 %
Neutro Abs: 0.1 10*3/uL — CL (ref 1.7–7.7)
Neutrophils Relative %: 44 %
Platelets: 79 10*3/uL — ABNORMAL LOW (ref 150–400)
RBC: 4.57 MIL/uL (ref 4.22–5.81)
RDW: 13.1 % (ref 11.5–15.5)
WBC: 0.3 10*3/uL — CL (ref 4.0–10.5)
nRBC: 0 % (ref 0.0–0.2)

## 2021-05-29 LAB — COMPREHENSIVE METABOLIC PANEL
ALT: 25 U/L (ref 0–44)
AST: 18 U/L (ref 15–41)
Albumin: 3.3 g/dL — ABNORMAL LOW (ref 3.5–5.0)
Alkaline Phosphatase: 57 U/L (ref 38–126)
Anion gap: 12 (ref 5–15)
BUN: 15 mg/dL (ref 8–23)
CO2: 22 mmol/L (ref 22–32)
Calcium: 8.4 mg/dL — ABNORMAL LOW (ref 8.9–10.3)
Chloride: 96 mmol/L — ABNORMAL LOW (ref 98–111)
Creatinine, Ser: 1.24 mg/dL (ref 0.61–1.24)
GFR, Estimated: >60 mL/min (ref >60)
Glucose, Bld: 428 mg/dL — ABNORMAL HIGH (ref 70–99)
Potassium: 4.7 mmol/L (ref 3.5–5.1)
Sodium: 130 mmol/L — ABNORMAL LOW (ref 135–145)
Total Bilirubin: 1.6 mg/dL — ABNORMAL HIGH (ref 0.3–1.2)
Total Protein: 7 g/dL (ref 6.5–8.1)

## 2021-05-29 LAB — URINALYSIS, ROUTINE W REFLEX MICROSCOPIC
Bacteria, UA: NONE SEEN
Bilirubin Urine: NEGATIVE
Glucose, UA: >=500 mg/dL — AB
Ketones, ur: 5 mg/dL — AB
Leukocytes,Ua: NEGATIVE
Nitrite: NEGATIVE
Protein, ur: NEGATIVE mg/dL
Specific Gravity, Urine: 1.018 (ref 1.005–1.030)
pH: 5 (ref 5.0–8.0)

## 2021-05-29 LAB — PROTIME-INR
INR: 1.2 (ref 0.8–1.2)
Prothrombin Time: 15 seconds (ref 11.4–15.2)

## 2021-05-29 LAB — LACTIC ACID, PLASMA
Lactic Acid, Venous: 3.8 mmol/L (ref 0.5–1.9)
Lactic Acid, Venous: 2.8 mmol/L (ref 0.5–1.9)

## 2021-05-29 LAB — LIPASE, BLOOD: Lipase: 21 U/L (ref 11–51)

## 2021-05-29 LAB — GLUCOSE, CAPILLARY: Glucose-Capillary: 296 mg/dL — ABNORMAL HIGH (ref 70–99)

## 2021-05-29 MED ORDER — INSULIN ASPART 100 UNIT/ML IJ SOLN
0.0000 [IU] | Freq: Three times a day (TID) | INTRAMUSCULAR | Status: DC
  Administered 2021-05-30: 3 [IU] via SUBCUTANEOUS
  Administered 2021-05-30: 5 [IU] via SUBCUTANEOUS
  Administered 2021-05-30 – 2021-05-31 (×2): 2 [IU] via SUBCUTANEOUS
  Administered 2021-05-31: 08:00:00 1 [IU] via SUBCUTANEOUS
  Administered 2021-05-31 – 2021-06-01 (×2): 2 [IU] via SUBCUTANEOUS
  Administered 2021-06-01: 18:00:00 3 [IU] via SUBCUTANEOUS
  Administered 2021-06-02: 5 [IU] via SUBCUTANEOUS
  Administered 2021-06-02: 7 [IU] via SUBCUTANEOUS
  Administered 2021-06-02: 5 [IU] via SUBCUTANEOUS
  Administered 2021-06-03: 7 [IU] via SUBCUTANEOUS
  Administered 2021-06-03: 3 [IU] via SUBCUTANEOUS

## 2021-05-29 MED ORDER — ROSUVASTATIN CALCIUM 10 MG PO TABS
10.0000 mg | ORAL_TABLET | Freq: Every day | ORAL | Status: DC
  Administered 2021-05-30 – 2021-06-01 (×3): 10 mg via ORAL
  Filled 2021-05-29 (×3): qty 1

## 2021-05-29 MED ORDER — ASPIRIN EC 81 MG PO TBEC
81.0000 mg | DELAYED_RELEASE_TABLET | Freq: Every day | ORAL | Status: DC
Start: 2021-05-30 — End: 2021-06-08
  Administered 2021-05-30 – 2021-06-07 (×9): 81 mg via ORAL
  Filled 2021-05-29 (×9): qty 1

## 2021-05-29 MED ORDER — SODIUM CHLORIDE 0.9% FLUSH: 10.0000 mL | INTRAVENOUS | Status: DC | PRN

## 2021-05-29 MED ORDER — ATENOLOL 50 MG PO TABS
100.0000 mg | ORAL_TABLET | Freq: Every day | ORAL | Status: DC
  Filled 2021-05-29: qty 2

## 2021-05-29 MED ORDER — SODIUM CHLORIDE 0.9 % IV SOLN
2.0000 g | Freq: Three times a day (TID) | INTRAVENOUS | Status: DC
  Administered 2021-05-29 – 2021-05-30 (×3): 2 g via INTRAVENOUS
  Filled 2021-05-29 (×4): qty 2

## 2021-05-29 MED ORDER — ARFORMOTEROL TARTRATE 15 MCG/2ML IN NEBU
15.0000 ug | INHALATION_SOLUTION | Freq: Two times a day (BID) | RESPIRATORY_TRACT | Status: DC
  Administered 2021-05-30 – 2021-06-08 (×18): 15 ug via RESPIRATORY_TRACT
  Filled 2021-05-29 (×20): qty 2

## 2021-05-29 MED ORDER — IOHEXOL 350 MG/ML SOLN
75.0000 mL | Freq: Once | INTRAVENOUS | Status: AC | PRN
  Administered 2021-05-29: 75 mL via INTRAVENOUS

## 2021-05-29 MED ORDER — VANCOMYCIN HCL IN DEXTROSE 1-5 GM/200ML-% IV SOLN
1000.0000 mg | Freq: Once | INTRAVENOUS | Status: AC
  Administered 2021-05-29: 1000 mg via INTRAVENOUS
  Filled 2021-05-29: qty 200

## 2021-05-29 MED ORDER — ACETAMINOPHEN 325 MG PO TABS: 650.0000 mg | ORAL_TABLET | Freq: Four times a day (QID) | ORAL | Status: DC | PRN

## 2021-05-29 MED ORDER — DULOXETINE HCL 60 MG PO CPEP
60.0000 mg | ORAL_CAPSULE | Freq: Every day | ORAL | Status: DC
  Administered 2021-05-29 – 2021-06-07 (×10): 60 mg via ORAL
  Filled 2021-05-29 (×10): qty 1

## 2021-05-29 MED ORDER — MORPHINE SULFATE (PF) 4 MG/ML IV SOLN
4.0000 mg | Freq: Once | INTRAVENOUS | Status: AC
  Administered 2021-05-29: 4 mg via INTRAVENOUS
  Filled 2021-05-29: qty 1

## 2021-05-29 MED ORDER — ACETAMINOPHEN 650 MG RE SUPP: 650.0000 mg | Freq: Four times a day (QID) | RECTAL | Status: DC | PRN

## 2021-05-29 MED ORDER — FAMCICLOVIR 500 MG PO TABS
250.0000 mg | ORAL_TABLET | Freq: Two times a day (BID) | ORAL | Status: DC
  Administered 2021-05-29 – 2021-06-08 (×20): 250 mg via ORAL
  Filled 2021-05-29 (×22): qty 0.5

## 2021-05-29 MED ORDER — LACTATED RINGERS IV BOLUS
1000.0000 mL | Freq: Once | INTRAVENOUS | Status: AC
  Administered 2021-05-29: 1000 mL via INTRAVENOUS

## 2021-05-29 MED ORDER — ALLOPURINOL 100 MG PO TABS: 100.0000 mg | ORAL_TABLET | Freq: Every day | ORAL | Status: DC

## 2021-05-29 MED ORDER — SODIUM CHLORIDE 0.9 % IV BOLUS
1000.0000 mL | Freq: Once | INTRAVENOUS | Status: AC
  Administered 2021-05-29: 1000 mL via INTRAVENOUS

## 2021-05-29 MED ORDER — CHLORHEXIDINE GLUCONATE CLOTH 2 % EX PADS
6.0000 | MEDICATED_PAD | Freq: Every day | CUTANEOUS | Status: DC
  Administered 2021-05-30 – 2021-06-07 (×9): 6 via TOPICAL

## 2021-05-29 MED ORDER — INSULIN GLARGINE-YFGN 100 UNIT/ML ~~LOC~~ SOLN
5.0000 [IU] | Freq: Once | SUBCUTANEOUS | Status: AC
  Administered 2021-05-29: 5 [IU] via SUBCUTANEOUS
  Filled 2021-05-29: qty 0.05

## 2021-05-29 MED ORDER — SODIUM CHLORIDE 0.9% FLUSH
10.0000 mL | Freq: Two times a day (BID) | INTRAVENOUS | Status: DC
  Administered 2021-05-30 – 2021-06-02 (×2): 10 mL

## 2021-05-29 MED ORDER — ENOXAPARIN SODIUM 40 MG/0.4ML IJ SOSY
40.0000 mg | PREFILLED_SYRINGE | INTRAMUSCULAR | Status: DC
  Administered 2021-05-30 – 2021-05-31 (×2): 40 mg via SUBCUTANEOUS
  Filled 2021-05-29 (×2): qty 0.4

## 2021-05-29 MED ORDER — ONDANSETRON HCL 4 MG/2ML IJ SOLN
INTRAMUSCULAR | Status: AC
  Administered 2021-05-29: 4 mg
  Filled 2021-05-29: qty 2

## 2021-05-29 MED ORDER — AZTREONAM 2 G IJ SOLR: 2.0000 g | Freq: Three times a day (TID) | INTRAMUSCULAR | Status: DC

## 2021-05-29 MED ORDER — POTASSIUM CHLORIDE CRYS ER 20 MEQ PO TBCR
20.0000 meq | EXTENDED_RELEASE_TABLET | Freq: Every day | ORAL | Status: DC
  Administered 2021-05-30: 20 meq via ORAL
  Filled 2021-05-29: qty 1

## 2021-05-29 MED ORDER — LACTATED RINGERS IV SOLN: INTRAVENOUS | Status: DC

## 2021-05-29 MED ORDER — PANTOPRAZOLE SODIUM 40 MG PO TBEC
40.0000 mg | DELAYED_RELEASE_TABLET | Freq: Every day | ORAL | Status: DC
  Administered 2021-05-30 – 2021-06-08 (×10): 40 mg via ORAL
  Filled 2021-05-29 (×10): qty 1

## 2021-05-29 MED ORDER — TAMSULOSIN HCL 0.4 MG PO CAPS
0.4000 mg | ORAL_CAPSULE | Freq: Every evening | ORAL | Status: DC
  Administered 2021-05-30 – 2021-06-07 (×9): 0.4 mg via ORAL
  Filled 2021-05-29 (×9): qty 1

## 2021-05-29 NOTE — ED Notes (Addendum)
WBC count 0.3, absolute neutrophils 0.1, lactic acid 3.8, MD Horton aware.

## 2021-05-29 NOTE — Plan of Care (Signed)
Initiated [facility] general care plan

## 2021-05-29 NOTE — H&P (Addendum)
History and Physical    Aaron Weaver WIO:[id number] DOB: 02-Nov-1955 DOA: 05/29/2021  PCP: [name], MD  Patient coming from: Home.  Chief Complaint: Fall and weakness.  HPI: Aaron Weaver Sr. is a 66 y.o. male with history of diabetes mellitus type 2, abdominal aortic aneurysm status post endovascular repair, COPD was recently diagnosed with lymphoma had underwent chemotherapy on May 20, 2021 following which patient also received Neulasta on January 16 presents to the ER because of weakness fall and not feeling well.  Patient has been having the symptoms for last 2 days.  Patient states patient also had multiple episodes of diarrhea denies any abdominal pain chest pain or shortness of breath.  Patient did have a fall but did not lose consciousness did not hit his head.  Over the last 24 hours patient also has been having frequent urination with dysuria.  Did not notice any blood in the urine.  ED Course: In the ER patient afebrile but labs do show significant pancytopenia with absolute neutrophil count less than 500.  CT angiogram of the chest was unremarkable and CT head is unremarkable.  Lactic acid was elevated.  Blood sugar was 428 with anion gap of 12.  Patient admitted for SIRS dehydration.  COVID test is pending.  Review of Systems: As per HPI, rest all negative.   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[place]) 05/04/2021   Dysrhythmia    GERD (gastroesophageal reflux disease)    Goals of care, counseling/discussion 05/04/2021   History of dehydration    Impacted cerumen    Impotence of organic origin    Lumbago    Lung nodule     Other and unspecified hyperlipidemia    Routine general medical examination at a health care facility    Skipped heart beats    Tobacco use disorder    Type II or unspecified type diabetes mellitus without mention of complication, not stated as uncontrolled    type II   Unspecified essential hypertension     Past Surgical History:  Procedure Laterality Date   ABDOMINAL AORTIC ENDOVASCULAR STENT GRAFT Bilateral 07/12/2020   Procedure: ABDOMINAL AORTIC ENDOVASCULAR STENT GRAFT;  Surgeon: [name], MD;  Location: [facility] OR;  Service: Vascular;  Laterality: Bilateral;   AXILLARY LYMPH NODE BIOPSY Right 04/27/2021   Procedure: AXILLARY EXCISIONAL LYMPH NODE BIOPSY;  Surgeon: Dwan Bolt, MD;  Location: [place];  Service: General;  Laterality: Right;   BACK SURGERY     fracture   CARPECTOMY Right 09/11/2019   Procedure: PROXIMAL ROW CARPECTOMY; RADIAL STYLOIDECTOMY; POSTERIOR INTEROSSIUS NERVE RESECTION;  Surgeon: Daryll Brod, MD;  Location: [place];  Service: Orthopedics;  Laterality: Right;  AXILLARY BLOCK   COLONOSCOPY     INGUINAL HERNIA REPAIR     right   POLYPECTOMY     PORTACATH PLACEMENT Right 05/13/2021   Procedure: INSERTION PORT-A-CATH;  Surgeon: Dwan Bolt, MD;  Location: [facility] ORS;  Service: General;  Laterality: Right;   TONSILLECTOMY  reports that he quit smoking about 3 years ago. His smoking use included cigarettes. He started smoking about 56 years ago. He has a 26.50 pack-year smoking history. He has never used smokeless tobacco. He reports current alcohol use. He reports that he does not use drugs.  Allergies  Allergen Reactions   Fexofenadine Other (See Comments)    "dries me out too much"   Hydrochlorothiazide Other (See Comments)    REACTION: cramps   Penicillins Hives   Pravastatin Sodium Other (See Comments)    REACTION: aches   Requip [Ropinirole Hcl] Other (See Comments)    Bad dreams    Family  History  Problem Relation Age of Onset   Colon cancer Mother 61   Hypertension Father    Coronary artery disease Other        male<60 and male<50  1st degree relative   Colon polyps Brother    Esophageal cancer Neg Hx    Rectal cancer Neg Hx    Stomach cancer Neg Hx     Prior to Admission medications   Medication Sig Start Date End Date Taking? Authorizing Provider  acetaminophen (TYLENOL) 325 MG tablet Take 650 mg by mouth as needed for mild pain, moderate pain or headache.   Yes [provider]  albuterol (PROAIR HFA) 108 (90 Base) MCG/ACT inhaler Inhale 1-2 puffs into the lungs every 6 (six) hours as needed for wheezing or shortness of breath. 10/15/20  Yes Collene Gobble, MD  alendronate (FOSAMAX) 70 MG tablet Take 70 mg by mouth every Sunday. 02/12/20  Yes [provider]  allopurinol (ZYLOPRIM) 100 MG tablet Take 1 tablet (100 mg total) by mouth daily. Start 3 days BEFORE chemotherapy starts 05/04/21  Yes [name], MD  aspirin EC 81 MG EC tablet Take 1 tablet (81 mg total) by mouth daily at 6 (six) AM. Swallow whole. 07/14/20  Yes Ulyses Amor, PA-C  atenolol (TENORMIN) 100 MG tablet Take 100 mg by mouth daily. 09/02/20  Yes [provider]  calcium carbonate (OSCAL) 1500 (600 Ca) MG TABS tablet Take 600 mg of elemental calcium by mouth daily with breakfast.   Yes [provider]  diclofenac Sodium (VOLTAREN) 1 % GEL Apply 2 g topically daily as needed (pain).   Yes [provider]  DULoxetine (CYMBALTA) 60 MG capsule Take 60 mg by mouth at bedtime. 04/01/18  Yes [provider]  famciclovir (FAMVIR) 250 MG tablet Take 1 tablet (250 mg total) by mouth 2 (two) times daily. 05/04/21  Yes Ennever, Rudell Cobb, MD  glimepiride (AMARYL) 2 MG tablet Take 1 tablet (2 mg total) by mouth daily before breakfast. Patient taking differently: Take 2 mg by mouth 2 (two) times daily. 04/03/12  Yes Plotnikov, Evie Lacks, MD  JANUMET XR 50-1000  MG TB24 Take 2 tablets by mouth at bedtime. 07/14/20  Yes Ulyses Amor, PA-C  LORazepam (ATIVAN) 0.5 MG tablet Take 1 tablet (0.5 mg total) by mouth every 8 (eight) hours. Take for nausea prn Patient taking differently: Take 0.5 mg by mouth every 8 (eight) hours as needed. nausea prn 05/17/21  Yes Ennever, Rudell Cobb, MD  omeprazole (PRILOSEC) 40 MG capsule Take 40 mg by mouth daily.   Yes [provider]  potassium chloride SA (KLOR-CON M20) 20 MEQ tablet TAKE 1 TABLET (20 MEQ TOTAL) BY MOUTH DAILY.  Patient taking differently: 20 mEq daily. 02/13/13  Yes Plotnikov, Evie Lacks, MD  rosuvastatin (CRESTOR) 10 MG tablet Take 10  mg by mouth daily. 03/16/20  Yes [provider]  SEREVENT DISKUS 50 MCG/ACT diskus inhaler 1 puff 2 (two) times daily. 05/25/21  Yes [provider]  tamsulosin (FLOMAX) 0.4 MG CAPS capsule Take 0.4 mg by mouth every evening.   Yes [provider]  Zinc 50 MG TABS Take 50 mg by mouth daily.   Yes [provider]  famciclovir (FAMVIR) 250 MG tablet Take 1 tablet (250 mg total) by mouth 2 (two) times daily. Patient not taking: Reported on 05/29/2021 05/04/21   [name], MD  Fish Oil OIL Take 1,500 mg by mouth daily.    [provider]  lidocaine-prilocaine (EMLA) cream Apply to affected area once 05/16/21   Ennever, Rudell Cobb, MD  ondansetron (ZOFRAN) 8 MG tablet Take 1 tablet (8 mg total) by mouth 2 (two) times daily as needed for refractory nausea / vomiting. Start on day 3 after cyclophosphamide chemotherapy. 05/17/21   [name], MD  predniSONE (DELTASONE) 20 MG tablet Take 3 tablets (60 mg total) by mouth daily. Take with food on days 1-5 of chemotherapy. Patient not taking: Reported on 05/29/2021 05/17/21   [name], MD  prochlorperazine (COMPAZINE) 10 MG tablet Take 1 tablet (10 mg total) by mouth every 6 (six) hours as needed (Nausea or vomiting). 05/17/21   [name], MD  tadalafil (CIALIS) 5 MG tablet  Take 5 mg by mouth daily as needed for erectile dysfunction.    [provider]  Tiotropium Bromide Monohydrate (SPIRIVA RESPIMAT) 2.5 MCG/ACT AERS Inhale 2 puffs into the lungs daily. Patient not taking: Reported on 05/29/2021 11/26/20   Collene Gobble, MD    Physical Exam: Constitutional: Moderately built and nourished. Vitals:   05/29/21 1800 05/29/21 1848 05/29/21 2149 05/29/21 2201  BP: 119/77 (!) 141/77 120/69   Pulse: 96 (!) 103 100   Resp: (!) 24 (!) 24 (!) 22   Temp:   98.4 F (36.9 C)   TempSrc:   Oral   SpO2: 95% 97% 94%   Weight:    80.5 kg  Height:    6' (1.829 m)   Eyes: Anicteric no pallor. ENMT: No discharge from the ears eyes nose and mouth. Neck: No mass felt.  No neck rigidity. Respiratory: No rhonchi or crepitations. Cardiovascular: S1-S2 heard. Abdomen: Soft nontender bowel sound present. Musculoskeletal: No edema. Skin: No rash. Neurologic: Alert awake oriented time place and person.  Moves all extremities. Psychiatric: Appears normal.  Normal affect.   Labs on Admission: I have personally reviewed following labs and imaging studies  CBC: Recent Labs  Lab 05/29/21 1538  WBC 0.3*  NEUTROABS 0.1*  HGB 12.8*  HCT 37.9*  MCV 82.9  PLT 79*   Basic Metabolic Panel: Recent Labs  Lab 05/29/21 1538  NA 130*  K 4.7  CL 96*  CO2 22  GLUCOSE 428*  BUN 15  CREATININE 1.24  CALCIUM 8.4*   GFR: Estimated Creatinine Clearance: 63.4 mL/min (by C-G formula based on SCr of 1.24 mg/dL). Liver Function Tests: Recent Labs  Lab 05/29/21 1538  AST 18  ALT 25  ALKPHOS 57  BILITOT 1.6*  PROT 7.0  ALBUMIN 3.3*   Recent Labs  Lab 05/29/21 2055  LIPASE 21   No results for input(s): AMMONIA in the last 168 hours. Coagulation Profile: Recent Labs  Lab 05/29/21 1538  INR 1.2   Cardiac Enzymes: No results for input(s): CKTOTAL, CKMB, CKMBINDEX, TROPONINI in the last 168 hours. BNP (  last 3 results) No results for input(s): PROBNP in the  last 8760 hours. HbA1C: No results for input(s): HGBA1C in the last 72 hours. CBG: Recent Labs  Lab 05/29/21 2234  GLUCAP 296*   Lipid Profile: No results for input(s): CHOL, HDL, LDLCALC, TRIG, CHOLHDL, LDLDIRECT in the last 72 hours. Thyroid Function Tests: No results for input(s): TSH, T4TOTAL, FREET4, T3FREE, THYROIDAB in the last 72 hours. Anemia Panel: No results for input(s): VITAMINB12, FOLATE, FERRITIN, TIBC, IRON, RETICCTPCT in the last 72 hours. Urine analysis:    Component Value Date/Time   COLORURINE YELLOW 05/29/2021 1538   APPEARANCEUR CLEAR 05/29/2021 1538   LABSPEC 1.018 05/29/2021 1538   PHURINE 5.0 05/29/2021 1538   GLUCOSEU >=500 (A) 05/29/2021 1538   GLUCOSEU NEGATIVE 11/23/2011 0756   HGBUR SMALL (A) 05/29/2021 1538   BILIRUBINUR NEGATIVE 05/29/2021 1538   KETONESUR 5 (A) 05/29/2021 1538   PROTEINUR NEGATIVE 05/29/2021 1538   UROBILINOGEN 0.2 11/23/2011 0756   NITRITE NEGATIVE 05/29/2021 1538   LEUKOCYTESUR NEGATIVE 05/29/2021 1538   Sepsis Labs: @LABRCNTIP (procalcitonin:4,lacticidven:4) )No results found for this or any previous visit (from the past 240 hour(s)).   Radiological Exams on Admission: DG Chest 2 View  Result Date: 05/29/2021 CLINICAL DATA:  Suspected sepsis EXAM: CHEST - 2 VIEW COMPARISON:  05/13/2021 FINDINGS: Right-sided central venous port tip over the SVC. Partial clearing of previously noted patchy lower lung airspace opacities. Normal cardiomediastinal silhouette. No pneumothorax. IMPRESSION: Partial but incomplete clearing of previously noted patchy bilateral lower lung opacities suspicious for pneumonia. No new airspace disease is seen. Electronically Signed   By: Donavan Foil M.D.   On: 05/29/2021 16:15   CT Head Wo Contrast  Result Date: 05/29/2021 CLINICAL DATA:  Near syncope. Fall. Mental status change, unknown cause EXAM: CT HEAD WITHOUT CONTRAST TECHNIQUE: Contiguous axial images were obtained from the base of the skull  through the vertex without intravenous contrast. RADIATION DOSE REDUCTION: This exam was performed according to the departmental dose-optimization program which includes automated exposure control, adjustment of the mA and/or kV according to patient size and/or use of iterative reconstruction technique. COMPARISON:  03/28/2012 FINDINGS: Brain: No acute intracranial abnormality. Specifically, no hemorrhage, hydrocephalus, mass lesion, acute infarction, or significant intracranial injury. Vascular: No hyperdense vessel or unexpected calcification. Skull: No acute calvarial abnormality. Sinuses/Orbits: No acute findings Other: None IMPRESSION: No acute intracranial abnormality. Electronically Signed   By: Rolm Baptise M.D.   On: 05/29/2021 18:38   CT Angio Chest PE W/Cm &/Or Wo Cm  Result Date: 05/29/2021 CLINICAL DATA:  Near syncope.  Fall.  PE suspected. EXAM: CT ANGIOGRAPHY CHEST WITH CONTRAST TECHNIQUE: Multidetector CT imaging of the chest was performed using the standard protocol during bolus administration of intravenous contrast. Multiplanar CT image reconstructions and MIPs were obtained to evaluate the vascular anatomy. RADIATION DOSE REDUCTION: This exam was performed according to the departmental dose-optimization program which includes automated exposure control, adjustment of the mA and/or kV according to patient size and/or use of iterative reconstruction technique. CONTRAST:  13mL OMNIPAQUE IOHEXOL 350 MG/ML SOLN COMPARISON:  Standard CT chest 10/14/2019 FINDINGS: Cardiovascular: The heart size is normal. No substantial pericardial effusion. Coronary artery calcification is evident. Mild atherosclerotic calcification is noted in the wall of the thoracic aorta. There is no filling defect within the opacified pulmonary arteries to suggest the presence of an acute pulmonary embolus. Right Port-A-Cath tip is positioned in the distal SVC. Mediastinum/Nodes: No mediastinal lymphadenopathy. There is no  hilar lymphadenopathy. The esophagus has normal  imaging features. There is no axillary lymphadenopathy. Lungs/Pleura: Interval development of peripheral architectural distortion and scarring with areas of peripheral ground-glass opacity since prior chest CT and also since PET-CT of 04/15/2021. No focal airspace consolidation. No pleural effusion. No pneumothorax. Paraseptal emphysema noted in the lung apices. Upper Abdomen: The liver shows diffusely decreased attenuation suggesting fat deposition. Tiny fat density lesions in both adrenal glands are consistent with tiny adenomas or myelolipomas, stable. Musculoskeletal: No worrisome lytic or sclerotic osseous abnormality. Old fracture nonunion posterior right tenth rib with healed posterior ninth rib fracture. Evidence of previous vertebral augmentation at L1, incompletely visualized. Review of the MIP images confirms the above findings. IMPRESSION: 1. No CT evidence for acute pulmonary embolus. 2. Since prior PET-CT of 04/15/2021, the patient has developed peripheral patchy and nodular areas of architectural distortion and ground-glass opacity. Imaging features are suggestive of sequelae of prior atypical infection, including viral etiology. Acute infectious/inflammatory process is not excluded but considered less likely. 3. The liver shows diffusely decreased attenuation suggesting fat deposition. 4.  Emphysema (ICD10-J43.9) and Aortic Atherosclerosis (ICD10-170.0) Electronically Signed   By: Misty Stanley M.D.   On: 05/29/2021 18:47    EKG: Independently reviewed.  Normal sinus rhythm.  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  We will get CT pelvis to make sure there is no abscess around the prostate.  Follow cultures.  Follow lactic acid levels.  Check stool studies for diarrhea. Pancytopenia likely from recent chemotherapy had received Neulasta about 5 days ago.  We will consult patient's oncologist Dr. Marin Olp. Lymphoma status post recent chemotherapy.  Being followed by Dr. Marin Olp.  Will notify patient's oncologist. Diabetes mellitus type 2 with hyperglycemia likely from recent use of steroids.  I have ordered 1 dose of Lantus we will keep patient on sliding scale coverage.  Closely follow CBGs. History of hypertension on metoprolol. COPD not actively wheezing. History of abdominal arctic aneurysm status post endovascular repair.  COVID test is pending.   Addendum -patient's COVID test came back positive.  Patient not hypoxic at this time.  We will check inflammatory markers and keep patient on remdesivir if patient agrees.  No indication for steroids at this time since patient is not hypoxic.   DVT prophylaxis: SCDs.  Avoiding anticoagulation in the setting of thrombocytopenia. Code Status: Full code. Family Communication: Wife at the bedside. Disposition Plan: Home. Consults called: Patient's oncologist. Admission status: Observation.   Rise Patience MD [facility] Pager [phone].  If 7PM-7AM, please contact night-coverage [email or web address] Password [facility]  05/29/2021, 10:47 PM

## 2021-05-29 NOTE — ED Triage Notes (Signed)
Patient's wife reports near-syncopal episode earlier today. Pt reports no LOC but states he did fall during episode d/t feeling weak. Denies hitting head. Denies blood thinners. Pt states he is currently getting chemotherapy for lymphoma. Hx COPD.

## 2021-05-29 NOTE — Progress Notes (Signed)
A consult was received from an ED physician for vancomycin per pharmacy dosing.  The patient's profile has been reviewed for ht/wt/allergies/indication/available labs.   A one time order has been placed for vancomycin 1g.  Further antibiotics/pharmacy consults should be ordered by admitting physician if indicated.                       Thank you, Peggyann Juba, PharmD, BCPS 05/29/2021  7:22 PM

## 2021-05-29 NOTE — ED Provider Notes (Signed)
[facility] DEPT Provider Note   CSN: [id number] Arrival date & time: 05/29/21  1527     History  Chief Complaint  Patient presents with   Near Syncope    Aaron BOLANDER Sr. is a 66 y.o. male.  HPI  66 year old male with past medical history of COPD, GERD, lymphoma with active chemotherapy presents the emergency department with fall/syncope.  Patient is a poor historian in regards to differentiating between mechanical fall and syncope.  His spouse at bedside says that she heard a crash and by the time she got upstairs found him lying on his side.  Patient states that he remembers the fall, lost his balance and went down onto his right side.  Does not believe he hit his head or loss consciousness but is not sure.  Patient has been having watery diarrhea with nausea but no vomiting.  States that he has been feeling lightheaded for the past day.  He has had a syncopal event prior due to dehydration.  Denies any fever or productive cough.  No swelling of his lower extremities.  No active chest pain or back pain.  No seizure-like activity.  Home Medications Prior to Admission medications   Medication Sig Start Date End Date Taking? Authorizing Provider  acetaminophen (TYLENOL) 325 MG tablet Take 650 mg by mouth as needed for mild pain, moderate pain or headache.   Yes [provider]  albuterol (PROAIR HFA) 108 (90 Base) MCG/ACT inhaler Inhale 1-2 puffs into the lungs every 6 (six) hours as needed for wheezing or shortness of breath. 10/15/20  Yes Collene Gobble, MD  alendronate (FOSAMAX) 70 MG tablet Take 70 mg by mouth every Sunday. 02/12/20  Yes [provider]  allopurinol (ZYLOPRIM) 100 MG tablet Take 1 tablet (100 mg total) by mouth daily. Start 3 days BEFORE chemotherapy starts 05/04/21  Yes [name], MD  aspirin EC 81 MG EC tablet Take 1 tablet (81 mg total) by mouth daily at 6 (six) AM. Swallow whole. 07/14/20  Yes Ulyses Amor,  PA-C  atenolol (TENORMIN) 100 MG tablet Take 100 mg by mouth daily. 09/02/20  Yes [provider]  calcium carbonate (OSCAL) 1500 (600 Ca) MG TABS tablet Take 600 mg of elemental calcium by mouth daily with breakfast.   Yes [provider]  diclofenac Sodium (VOLTAREN) 1 % GEL Apply 2 g topically daily as needed (pain).   Yes [provider]  DULoxetine (CYMBALTA) 60 MG capsule Take 60 mg by mouth at bedtime. 04/01/18  Yes [provider]  famciclovir (FAMVIR) 250 MG tablet Take 1 tablet (250 mg total) by mouth 2 (two) times daily. 05/04/21  Yes Ennever, Rudell Cobb, MD  glimepiride (AMARYL) 2 MG tablet Take 1 tablet (2 mg total) by mouth daily before breakfast. Patient taking differently: Take 2 mg by mouth 2 (two) times daily. 04/03/12  Yes Plotnikov, Evie Lacks, MD  JANUMET XR 50-1000 MG TB24 Take 2 tablets by mouth at bedtime. 07/14/20  Yes Ulyses Amor, PA-C  LORazepam (ATIVAN) 0.5 MG tablet Take 1 tablet (0.5 mg total) by mouth every 8 (eight) hours. Take for nausea prn Patient taking differently: Take 0.5 mg by mouth every 8 (eight) hours as needed. nausea prn 05/17/21  Yes Ennever, Rudell Cobb, MD  omeprazole (PRILOSEC) 40 MG capsule Take 40 mg by mouth daily.   Yes [provider]  potassium chloride SA (KLOR-CON M20) 20 MEQ tablet TAKE 1 TABLET (20 MEQ TOTAL)  BY MOUTH DAILY.  Patient taking differently: 20 mEq daily. 02/13/13  Yes Plotnikov, Evie Lacks, MD  rosuvastatin (CRESTOR) 10 MG tablet Take 10 mg by mouth daily. 03/16/20  Yes [provider]  SEREVENT DISKUS 50 MCG/ACT diskus inhaler 1 puff 2 (two) times daily. 05/25/21  Yes [provider]  tamsulosin (FLOMAX) 0.4 MG CAPS capsule Take 0.4 mg by mouth every evening.   Yes [provider]  Zinc 50 MG TABS Take 50 mg by mouth daily.   Yes [provider]  famciclovir (FAMVIR) 250 MG tablet Take 1 tablet (250 mg total) by mouth 2 (two) times daily. Patient not taking:  Reported on 05/29/2021 05/04/21   [name], MD  Fish Oil OIL Take 1,500 mg by mouth daily.    [provider]  lidocaine-prilocaine (EMLA) cream Apply to affected area once 05/16/21   Ennever, Rudell Cobb, MD  ondansetron (ZOFRAN) 8 MG tablet Take 1 tablet (8 mg total) by mouth 2 (two) times daily as needed for refractory nausea / vomiting. Start on day 3 after cyclophosphamide chemotherapy. 05/17/21   [name], MD  predniSONE (DELTASONE) 20 MG tablet Take 3 tablets (60 mg total) by mouth daily. Take with food on days 1-5 of chemotherapy. Patient not taking: Reported on 05/29/2021 05/17/21   [name], MD  prochlorperazine (COMPAZINE) 10 MG tablet Take 1 tablet (10 mg total) by mouth every 6 (six) hours as needed (Nausea or vomiting). 05/17/21   [name], MD  tadalafil (CIALIS) 5 MG tablet Take 5 mg by mouth daily as needed for erectile dysfunction.    [provider]  Tiotropium Bromide Monohydrate (SPIRIVA RESPIMAT) 2.5 MCG/ACT AERS Inhale 2 puffs into the lungs daily. Patient not taking: Reported on 05/29/2021 11/26/20   Collene Gobble, MD      Allergies    Fexofenadine, Hydrochlorothiazide, Penicillins, Pravastatin sodium, and Requip [ropinirole hcl]    Review of Systems   Review of Systems  Constitutional:  Positive for appetite change and fatigue. Negative for fever.  Respiratory:  Negative for shortness of breath.   Cardiovascular:  Negative for chest pain.  Gastrointestinal:  Positive for diarrhea and nausea. Negative for abdominal pain and vomiting.  Genitourinary:  Negative for dysuria and flank pain.  Musculoskeletal:  Positive for neck pain. Negative for back pain.  Skin:  Negative for rash.  Neurological:  Positive for dizziness and light-headedness. Negative for headaches.   Physical Exam Updated Vital Signs BP (!) 149/91    Pulse (!) 103    Temp 98.5 F (36.9 C) (Oral)    Resp 19    SpO2 96%  Physical Exam Vitals and nursing note  reviewed.  Constitutional:      Appearance: Normal appearance. He is ill-appearing. He is not diaphoretic.  HENT:     Head: Normocephalic.     Mouth/Throat:     Mouth: Mucous membranes are moist.  Eyes:     Pupils: Pupils are equal, round, and reactive to light.  Cardiovascular:     Rate and Rhythm: Tachycardia present.  Pulmonary:     Effort: Pulmonary effort is normal.     Breath sounds: No rales.     Comments: At times tachypneic, decreased breath sounds at bases Abdominal:     Palpations: Abdomen is soft.     Tenderness: There is no guarding or rebound.  Musculoskeletal:        General: No swelling or deformity.     Cervical  back: No rigidity.  Skin:    General: Skin is warm.     Coloration: Skin is pale.  Neurological:     Mental Status: He is alert and oriented to person, place, and time. Mental status is at baseline.  Psychiatric:        Mood and Affect: Mood normal.    ED Results / Procedures / Treatments   Labs (all labs ordered are listed, but only abnormal results are displayed) Labs Reviewed  COMPREHENSIVE METABOLIC PANEL - Abnormal; Notable for the following components:      Result Value   Sodium 130 (*)    Chloride 96 (*)    Glucose, Bld 428 (*)    Calcium 8.4 (*)    Albumin 3.3 (*)    Total Bilirubin 1.6 (*)    All other components within normal limits  URINALYSIS, ROUTINE W REFLEX MICROSCOPIC - Abnormal; Notable for the following components:   Glucose, UA >=500 (*)    Hgb urine dipstick SMALL (*)    Ketones, ur 5 (*)    All other components within normal limits  CULTURE, BLOOD (ROUTINE X 2)  CULTURE, BLOOD (ROUTINE X 2)  PROTIME-INR  LACTIC ACID, PLASMA  LACTIC ACID, PLASMA  CBC WITH DIFFERENTIAL/PLATELET    EKG None  Radiology DG Chest 2 View  Result Date: 05/29/2021 CLINICAL DATA:  Suspected sepsis EXAM: CHEST - 2 VIEW COMPARISON:  05/13/2021 FINDINGS: Right-sided central venous port tip over the SVC. Partial clearing of previously noted  patchy lower lung airspace opacities. Normal cardiomediastinal silhouette. No pneumothorax. IMPRESSION: Partial but incomplete clearing of previously noted patchy bilateral lower lung opacities suspicious for pneumonia. No new airspace disease is seen. Electronically Signed   By: Donavan Foil M.D.   On: 05/29/2021 16:15    Procedures .Critical Care Performed by: Lorelle Gibbs, DO Authorized by: Lorelle Gibbs, DO   Critical care provider statement:    Critical care time (minutes):  45   Critical care was necessary to treat or prevent imminent or life-threatening deterioration of the following conditions:  Dehydration and sepsis   Critical care was time spent personally by me on the following activities:  Development of treatment plan with patient or surrogate, discussions with consultants, evaluation of patient's response to treatment, examination of patient, ordering and review of laboratory studies, ordering and review of radiographic studies, ordering and performing treatments and interventions, pulse oximetry, re-evaluation of patient's condition and review of old charts    Medications Ordered in ED Medications - No data to display  ED Course/ Medical Decision Making/ A&P                           Medical Decision Making Amount and/or Complexity of Data Reviewed Labs: ordered. Radiology: ordered.  Risk Prescription drug management. Decision regarding hospitalization.   This patient presents to the ED for concern of near syncope/lightheadedness, this involves an extensive number of treatment options, and is a complaint that carries with it a high risk of complications and morbidity.  The differential diagnosis includes near syncope, syncope, orthostasis, dehydration, infection, PE, ACS   Additional history obtained: -Additional history obtained from wife at bedside -External records from outside source obtained and reviewed including: Chart review including previous  notes, labs, imaging, consultation notes   Lab Tests: -I ordered, reviewed, and interpreted labs.  The pertinent results include: Mild dehydration, hyponatremia, leukopenia, lactic of 3.8   EKG -Sinus rhythm   Imaging  Studies ordered: -I ordered imaging studies including chest x-ray, CT head, CT PE study -I independently visualized and interpreted imaging which showed inflammatory/infectious findings on the chest x-ray, baseline unremarkable CT head, negative for PE but findings of groundglass opacities concerning for atypical infection, viral infection, inflammation -I agree with the radiologist interpretation   Medicines ordered and prescription drug management: -I ordered medication including IV fluids, pain medicine for dehydration/tachycardia, pain control -Reevaluation of the patient after these medicines showed that the patient improved -I have reviewed the patients home medicines and have made adjustments as needed   Consultations Obtained: I requested consultation with the hospitalist,  and discussed lab and imaging findings as well as pertinent plan - they recommend: Admission   ED Course: 66 year old male presents emergency department with what appears to be near syncope versus mechanical fall.  Patient states he did not lose consciousness but somehow fell and ended up on the right side on the floor.  Cant give specifics regarding this event. States that he has been feeling lightheaded.  Has had syncope before due to dehydration.  Believes that he is dehydrated from chemotherapy with treatment for active lymphoma and known leukopenia.  Denies any fever productive cough, no swelling of his lower legs.  Denies any abdominal pain.  Was tachycardic on arrival, afebrile, stable blood pressure.  Chest x-ray shows inflammatory versus infectious findings.  Blood work shows leukopenia, elevated lactic.  He is also dehydrated with mild hyponatremia.  CT PE study done for tachycardia,  tachypnea in the setting of near syncope as well as further identification of chest x-ray findings.  This identifies no PE but shows groundglass opacities concerning for atypical infection/viral infection versus inflammation.  He has known inflammatory lung findings on PET scan, unclear if this is progressed.  In the setting of SIRS criteria and findings will treat presumably as infection in the lungs, flu and COVID swab is pending.  Son at bedside now reveals that the patient has been having watery diarrhea.  The patient denies any abdominal pain, abdomen is soft and nontender on palpation.  Lipase will be added on.  Patient has not voided a urine sample yet, will do bladder scan to ensure no urinary retention with him receiving IV hydration.   Critical Interventions: IV fluids   Cardiac Monitoring: The patient was maintained on a cardiac monitor.  I personally viewed and interpreted the cardiac monitored which showed an underlying rhythm of: Sinus rhythm   Reevaluation: After the interventions noted above, I reevaluated the patient and found that they have :improved   Dispostion: Patients evaluation and results requires admission for further treatment and care.  Spoke with hospitalist Dr. Hal Hope, reviewed patient's ED course and they accept admission.  Patient agrees with admission plan, offers no new complaints and is stable/unchanged at time of admit.        Final Clinical Impression(s) / ED Diagnoses Final diagnoses:  None    Rx / DC Orders ED Discharge Orders     None         Lorelle Gibbs, DO 05/29/21 2035

## 2021-05-30 ENCOUNTER — Telehealth: Payer: Self-pay

## 2021-05-30 ENCOUNTER — Ambulatory Visit (HOSPITAL_COMMUNITY): Payer: Medicare Other

## 2021-05-30 ENCOUNTER — Other Ambulatory Visit (HOSPITAL_BASED_OUTPATIENT_CLINIC_OR_DEPARTMENT_OTHER): Payer: Self-pay

## 2021-05-30 ENCOUNTER — Encounter (HOSPITAL_COMMUNITY): Payer: Self-pay

## 2021-05-30 ENCOUNTER — Observation Stay (HOSPITAL_COMMUNITY): Payer: Medicare Other

## 2021-05-30 ENCOUNTER — Encounter (HOSPITAL_COMMUNITY): Payer: Self-pay | Admitting: Internal Medicine

## 2021-05-30 DIAGNOSIS — R6510 Systemic inflammatory response syndrome (SIRS) of non-infectious origin without acute organ dysfunction: Secondary | ICD-10-CM

## 2021-05-30 DIAGNOSIS — R338 Other retention of urine: Secondary | ICD-10-CM | POA: Diagnosis not present

## 2021-05-30 DIAGNOSIS — L0291 Cutaneous abscess, unspecified: Secondary | ICD-10-CM | POA: Diagnosis not present

## 2021-05-30 DIAGNOSIS — R7881 Bacteremia: Secondary | ICD-10-CM | POA: Diagnosis not present

## 2021-05-30 DIAGNOSIS — N179 Acute kidney failure, unspecified: Secondary | ICD-10-CM | POA: Diagnosis not present

## 2021-05-30 DIAGNOSIS — E860 Dehydration: Secondary | ICD-10-CM | POA: Diagnosis present

## 2021-05-30 DIAGNOSIS — B9561 Methicillin susceptible Staphylococcus aureus infection as the cause of diseases classified elsewhere: Secondary | ICD-10-CM

## 2021-05-30 DIAGNOSIS — I10 Essential (primary) hypertension: Secondary | ICD-10-CM | POA: Diagnosis not present

## 2021-05-30 DIAGNOSIS — R339 Retention of urine, unspecified: Secondary | ICD-10-CM | POA: Diagnosis present

## 2021-05-30 DIAGNOSIS — E871 Hypo-osmolality and hyponatremia: Secondary | ICD-10-CM | POA: Diagnosis present

## 2021-05-30 DIAGNOSIS — A4102 Sepsis due to Methicillin resistant Staphylococcus aureus: Secondary | ICD-10-CM | POA: Diagnosis present

## 2021-05-30 DIAGNOSIS — F32A Depression, unspecified: Secondary | ICD-10-CM | POA: Diagnosis present

## 2021-05-30 DIAGNOSIS — U071 COVID-19: Secondary | ICD-10-CM

## 2021-05-30 DIAGNOSIS — E785 Hyperlipidemia, unspecified: Secondary | ICD-10-CM | POA: Diagnosis present

## 2021-05-30 DIAGNOSIS — C8338 Diffuse large B-cell lymphoma, lymph nodes of multiple sites: Secondary | ICD-10-CM | POA: Diagnosis present

## 2021-05-30 DIAGNOSIS — T451X5A Adverse effect of antineoplastic and immunosuppressive drugs, initial encounter: Secondary | ICD-10-CM | POA: Diagnosis present

## 2021-05-30 DIAGNOSIS — R197 Diarrhea, unspecified: Secondary | ICD-10-CM | POA: Diagnosis present

## 2021-05-30 DIAGNOSIS — Z79899 Other long term (current) drug therapy: Secondary | ICD-10-CM | POA: Diagnosis not present

## 2021-05-30 DIAGNOSIS — E1165 Type 2 diabetes mellitus with hyperglycemia: Secondary | ICD-10-CM | POA: Diagnosis present

## 2021-05-30 DIAGNOSIS — D701 Agranulocytosis secondary to cancer chemotherapy: Secondary | ICD-10-CM | POA: Diagnosis present

## 2021-05-30 DIAGNOSIS — R6520 Severe sepsis without septic shock: Secondary | ICD-10-CM | POA: Diagnosis not present

## 2021-05-30 DIAGNOSIS — M79621 Pain in right upper arm: Secondary | ICD-10-CM | POA: Diagnosis not present

## 2021-05-30 DIAGNOSIS — B9562 Methicillin resistant Staphylococcus aureus infection as the cause of diseases classified elsewhere: Secondary | ICD-10-CM | POA: Diagnosis not present

## 2021-05-30 DIAGNOSIS — J449 Chronic obstructive pulmonary disease, unspecified: Secondary | ICD-10-CM | POA: Diagnosis not present

## 2021-05-30 DIAGNOSIS — I700 Atherosclerosis of aorta: Secondary | ICD-10-CM | POA: Diagnosis not present

## 2021-05-30 DIAGNOSIS — Z8342 Family history of familial hypercholesterolemia: Secondary | ICD-10-CM | POA: Diagnosis not present

## 2021-05-30 DIAGNOSIS — I480 Paroxysmal atrial fibrillation: Secondary | ICD-10-CM | POA: Diagnosis present

## 2021-05-30 DIAGNOSIS — D61818 Other pancytopenia: Secondary | ICD-10-CM

## 2021-05-30 DIAGNOSIS — L02411 Cutaneous abscess of right axilla: Secondary | ICD-10-CM | POA: Diagnosis not present

## 2021-05-30 DIAGNOSIS — Z7982 Long term (current) use of aspirin: Secondary | ICD-10-CM | POA: Diagnosis not present

## 2021-05-30 DIAGNOSIS — D61810 Antineoplastic chemotherapy induced pancytopenia: Secondary | ICD-10-CM | POA: Diagnosis present

## 2021-05-30 DIAGNOSIS — K219 Gastro-esophageal reflux disease without esophagitis: Secondary | ICD-10-CM | POA: Diagnosis present

## 2021-05-30 DIAGNOSIS — A419 Sepsis, unspecified organism: Secondary | ICD-10-CM | POA: Diagnosis not present

## 2021-05-30 DIAGNOSIS — R109 Unspecified abdominal pain: Secondary | ICD-10-CM | POA: Diagnosis not present

## 2021-05-30 DIAGNOSIS — Z794 Long term (current) use of insulin: Secondary | ICD-10-CM | POA: Diagnosis not present

## 2021-05-30 LAB — COMPREHENSIVE METABOLIC PANEL
ALT: 21 U/L (ref 0–44)
AST: 17 U/L (ref 15–41)
Albumin: 2.9 g/dL — ABNORMAL LOW (ref 3.5–5.0)
Alkaline Phosphatase: 45 U/L (ref 38–126)
Anion gap: 11 (ref 5–15)
BUN: 18 mg/dL (ref 8–23)
CO2: 20 mmol/L — ABNORMAL LOW (ref 22–32)
Calcium: 7.8 mg/dL — ABNORMAL LOW (ref 8.9–10.3)
Chloride: 101 mmol/L (ref 98–111)
Creatinine, Ser: 1.29 mg/dL — ABNORMAL HIGH (ref 0.61–1.24)
GFR, Estimated: >60 mL/min (ref >60)
Glucose, Bld: 304 mg/dL — ABNORMAL HIGH (ref 70–99)
Potassium: 3.8 mmol/L (ref 3.5–5.1)
Sodium: 132 mmol/L — ABNORMAL LOW (ref 135–145)
Total Bilirubin: 1.3 mg/dL — ABNORMAL HIGH (ref 0.3–1.2)
Total Protein: 6.4 g/dL — ABNORMAL LOW (ref 6.5–8.1)

## 2021-05-30 LAB — RESP PANEL BY RT-PCR (FLU A&B, COVID) ARPGX2
Influenza A by PCR: NEGATIVE
Influenza B by PCR: NEGATIVE
SARS Coronavirus 2 by RT PCR: POSITIVE — AB

## 2021-05-30 LAB — BLOOD CULTURE ID PANEL (REFLEXED) - BCID2

## 2021-05-30 LAB — CBC WITH DIFFERENTIAL/PLATELET
Abs Immature Granulocytes: 0.01 10*3/uL (ref 0.00–0.07)
Basophils Absolute: 0 10*3/uL (ref 0.0–0.1)
Basophils Relative: 2 %
Eosinophils Absolute: 0 10*3/uL (ref 0.0–0.5)
Eosinophils Relative: 0 %
HCT: 33.1 % — ABNORMAL LOW (ref 39.0–52.0)
Hemoglobin: 11.5 g/dL — ABNORMAL LOW (ref 13.0–17.0)
Immature Granulocytes: 1 %
Lymphocytes Relative: 12 %
Lymphs Abs: 0.1 10*3/uL — ABNORMAL LOW (ref 0.7–4.0)
MCH: 28.5 pg (ref 26.0–34.0)
MCHC: 34.7 g/dL (ref 30.0–36.0)
MCV: 81.9 fL (ref 80.0–100.0)
Monocytes Absolute: 0.2 10*3/uL (ref 0.1–1.0)
Monocytes Relative: 15 %
Neutro Abs: 0.8 10*3/uL — ABNORMAL LOW (ref 1.7–7.7)
Neutrophils Relative %: 70 %
Platelets: 56 10*3/uL — ABNORMAL LOW (ref 150–400)
RBC: 4.04 MIL/uL — ABNORMAL LOW (ref 4.22–5.81)
RDW: 13.1 % (ref 11.5–15.5)
WBC: 1.1 10*3/uL — CL (ref 4.0–10.5)
nRBC: 0 % (ref 0.0–0.2)

## 2021-05-30 LAB — HIV ANTIBODY (ROUTINE TESTING W REFLEX): HIV Screen 4th Generation wRfx: NONREACTIVE

## 2021-05-30 LAB — LACTIC ACID, PLASMA
Lactic Acid, Venous: 2.7 mmol/L (ref 0.5–1.9)
Lactic Acid, Venous: 2.8 mmol/L (ref 0.5–1.9)

## 2021-05-30 LAB — C DIFFICILE QUICK SCREEN W PCR REFLEX
C Diff antigen: NEGATIVE
C Diff interpretation: NOT DETECTED
C Diff toxin: NEGATIVE

## 2021-05-30 LAB — GLUCOSE, CAPILLARY
Glucose-Capillary: 293 mg/dL — ABNORMAL HIGH (ref 70–99)
Glucose-Capillary: 248 mg/dL — ABNORMAL HIGH (ref 70–99)
Glucose-Capillary: 192 mg/dL — ABNORMAL HIGH (ref 70–99)
Glucose-Capillary: 151 mg/dL — ABNORMAL HIGH (ref 70–99)

## 2021-05-30 MED ORDER — SODIUM CHLORIDE 0.9 % IV SOLN
200.0000 mg | Freq: Once | INTRAVENOUS | Status: AC
  Administered 2021-05-30: 200 mg via INTRAVENOUS
  Filled 2021-05-30: qty 40

## 2021-05-30 MED ORDER — SODIUM CHLORIDE 0.9 % IV SOLN
100.0000 mg | Freq: Every day | INTRAVENOUS | Status: AC
  Administered 2021-05-31 – 2021-06-03 (×4): 100 mg via INTRAVENOUS
  Filled 2021-05-30 (×4): qty 20

## 2021-05-30 MED ORDER — SODIUM CHLORIDE (PF) 0.9 % IJ SOLN
INTRAMUSCULAR | Status: AC
  Filled 2021-05-30: qty 50

## 2021-05-30 MED ORDER — LACTATED RINGERS IV BOLUS
500.0000 mL | Freq: Once | INTRAVENOUS | Status: AC
  Administered 2021-05-30: 500 mL via INTRAVENOUS

## 2021-05-30 MED ORDER — IOHEXOL 300 MG/ML  SOLN
100.0000 mL | Freq: Once | INTRAMUSCULAR | Status: AC | PRN
  Administered 2021-05-30: 100 mL via INTRAVENOUS

## 2021-05-30 MED ORDER — IPRATROPIUM-ALBUTEROL 0.5-2.5 (3) MG/3ML IN SOLN
3.0000 mL | Freq: Once | RESPIRATORY_TRACT | Status: AC
  Administered 2021-05-30: 3 mL via RESPIRATORY_TRACT
  Filled 2021-05-30: qty 3

## 2021-05-30 MED ORDER — VANCOMYCIN HCL 1500 MG/300ML IV SOLN
1500.0000 mg | INTRAVENOUS | Status: DC
  Administered 2021-05-30: 1500 mg via INTRAVENOUS
  Filled 2021-05-30 (×2): qty 300

## 2021-05-30 MED ORDER — VANCOMYCIN HCL 750 MG/150ML IV SOLN
750.0000 mg | Freq: Two times a day (BID) | INTRAVENOUS | Status: DC
  Filled 2021-05-30: qty 150

## 2021-05-30 MED ORDER — PHENAZOPYRIDINE HCL 100 MG PO TABS
100.0000 mg | ORAL_TABLET | Freq: Three times a day (TID) | ORAL | Status: DC
  Administered 2021-05-30 (×3): 100 mg via ORAL
  Filled 2021-05-30 (×6): qty 1

## 2021-05-30 MED ORDER — IOHEXOL 9 MG/ML PO SOLN
1000.0000 mL | ORAL | Status: AC
  Administered 2021-05-30: 1000 mL via ORAL

## 2021-05-30 NOTE — Assessment & Plan Note (Addendum)
MRSA bacteremia source. Complicated by neutropenia in setting of recent chemotherapy. Blood cultures obtained and results confirm MRSA, without source. CT abdomen/pelvis ordered unremarkable for abscess. Started empirically on Vancomycin and Cefepime and switched to Vancomycin monotherapy. ID auto-consulted. -See problem,

## 2021-05-30 NOTE — Assessment & Plan Note (Addendum)
Possibly related to chemotherapy. C. difficile negative. -Supportive care

## 2021-05-30 NOTE — Progress Notes (Signed)
PROGRESS NOTE    Aaron TENER Sr.  WUJ:[id number] DOB: 1955-08-20 DOA: 05/29/2021 PCP: [name], MD   Brief Narrative: Aaron Fee Sr. Is a 66 y.o. male with a history of diabetes mellitus type 2, AAA s/p endovascular repair, COPD, lymphoma recently started chemotherapy. Patient presented secondary to fall and weakness and found to have evidence of SIRS with unknown infectious source. Empiric antibiotics and cultures obtained.   Assessment & Plan:   * SIRS (systemic inflammatory response syndrome) (HCC)- (present on admission) Unknown source. Complicated by neutropenia in setting of recent chemotherapy. Blood cultures obtained and results are pending. CT abdomen/pelvis ordered and pending. Started empirically on Vancomycin and Cefepime -Continue Vancomycin/Cefepime -Follow-up blood cultures and CT imaging  Acute urinary retention Unknown etiology at this time. Patient has required multiple I/O catheterization.  -Foley catheter -Urine culture  Pancytopenia ([place]) Secondary to recent chemotherapy. ANC of 800. 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 Marin Olp and is on chemotherapy. First cycle started 1/13 and he received G-CSF on 1/6 -Continue Famvir for shingles prophylaxis  COPD (chronic obstructive pulmonary disease) ([place])- (present on admission) Stable. Patient is on Servent, albuterol as an outpatient -Continue Brovana (substitute for Servent) -Duoneb prn  AAA (abdominal aortic aneurysm) without rupture- (present on admission) History of repair.  Essential hypertension- (present on admission) Patient with hypotension -Discontinue atenolol  DM2 (diabetes mellitus, type 2) ([place]) Patient is on Janumet and glimepiride as an outpatient -Continue SSI  AKI (acute kidney injury) ([place]) Baseline creatinine of about 0.8-0.9. Creatinine of 1.24 on  admission, worseend to 1.29. -Continue IV fluids  COVID-19 virus infection Incidental finding. Remdesivir started on 1/23. No steroids secondary to lack of hypoxia. -Continue Remdesivir IV -Daily CMP, CBC, D-dimer, CRP  HYPERLIPIDEMIA -Continue Crestor     DVT prophylaxis: Lovenox Code Status:   Code Status: Full Code Family Communication: Wife and son at bedside Disposition Plan: Discharge home likely in 2-5 days pending culture data, hemodynamic stability   Consultants:  Medical oncology  Procedures:  None  Antimicrobials: Vancomycin Cefepime    Subjective: Patient reports no dyspnea or chest pain. Some difficulty urinating. Diarrhea.   Objective: Vitals:   05/30/21 1200 05/30/21 1300 05/30/21 1330 05/30/21 1335  BP: 110/77 (!) 89/49 108/64   Pulse: 91 90 87 87  Resp: (!) 27 (!) 34 (!) 24 (!) 27  Temp:  97.8 F (36.6 C)    TempSrc: Oral Oral    SpO2: 94% 97% 90% 94%  Weight:      Height:        Intake/Output Summary (Last 24 hours) at 05/30/2021 1400 Last data filed at 05/30/2021 0840 Gross per 24 hour  Intake 1886.39 ml  Output 1300 ml  Net 586.39 ml   Filed Weights   05/29/21 2201  Weight: 80.5 kg    Examination:  General exam: Appears calm and comfortable Respiratory system: Clear to auscultation. Respiratory effort normal. Cardiovascular system: S1 & S2 heard, RRR. No murmurs, rubs, gallops or clicks. Gastrointestinal system: Abdomen is slightly distended, soft and nontender. Normal bowel sounds heard. Central nervous system: Alert and oriented. No focal neurological deficits. Musculoskeletal: No calf tenderness Skin: No cyanosis. No rashes Psychiatry: Judgement and insight appear normal. Mood & affect appropriate.     Data Reviewed: I have personally reviewed following labs and imaging studies  CBC Lab Results  Component Value Date   WBC  1.1 (LL) 05/30/2021   RBC 4.04 (L) 05/30/2021   HGB 11.5 (L) 05/30/2021   HCT 33.1 (L)  05/30/2021   MCV 81.9 05/30/2021   MCH 28.5 05/30/2021   PLT 56 (L) 05/30/2021   MCHC 34.7 05/30/2021   RDW 13.1 05/30/2021   LYMPHSABS 0.1 (L) 05/30/2021   MONOABS 0.2 05/30/2021   EOSABS 0.0 05/30/2021   BASOSABS 0.0 65/78/4696     Last metabolic panel Lab Results  Component Value Date   NA 132 (L) 05/30/2021   K 3.8 05/30/2021   CL 101 05/30/2021   CO2 20 (L) 05/30/2021   BUN 18 05/30/2021   CREATININE 1.29 (H) 05/30/2021   GLUCOSE 304 (H) 05/30/2021   GFRNONAA >60 05/30/2021   GFRAA >60 09/08/2019   CALCIUM 7.8 (L) 05/30/2021   PROT 6.4 (L) 05/30/2021   ALBUMIN 2.9 (L) 05/30/2021   BILITOT 1.3 (H) 05/30/2021   ALKPHOS 45 05/30/2021   AST 17 05/30/2021   ALT 21 05/30/2021   ANIONGAP 11 05/30/2021    CBG (last 3)  Recent Labs    05/29/21 2234 05/30/21 0748 05/30/21 1253  GLUCAP 296* 293* 248*     GFR: Estimated Creatinine Clearance: 61 mL/min (A) (by C-G formula based on SCr of 1.29 mg/dL (H)).  Coagulation Profile: Recent Labs  Lab 05/29/21 1538  INR 1.2    Recent Results (from the past 240 hour(s))  Culture, blood (Routine x 2)     Status: None (Preliminary result)   Collection Time: 05/29/21  3:38 PM   Specimen: BLOOD  Result Value Ref Range Status   Specimen Description   Final    BLOOD RIGHT ANTECUBITAL Performed at [facility] [address] [place], [place] [address]    Special Requests   Final    BOTTLES DRAWN AEROBIC AND ANAEROBIC Blood Culture adequate volume Performed at [facility] [address] [place], [place] [address]    Culture  Setup Time   Final    GRAM POSITIVE COCCI IN CLUSTERS AEROBIC BOTTLE ONLY CRITICAL RESULT CALLED TO, READ BACK BY AND VERIFIED WITH: Damian Leavell [id number] AT 1559 BY CM Performed at [facility], [place] [address] [place], [place] [address]    Culture GRAM POSITIVE COCCI  Final   Report Status PENDING  Incomplete  Blood Culture ID Panel (Reflexed)      Status: Abnormal   Collection Time: 05/29/21  3:38 PM  Result Value Ref Range Status   Enterococcus faecalis NOT DETECTED NOT DETECTED Final   Enterococcus Faecium NOT DETECTED NOT DETECTED Final   Listeria monocytogenes NOT DETECTED NOT DETECTED Final   Staphylococcus species DETECTED (A) NOT DETECTED Final    Comment: CRITICAL RESULT CALLED TO, READ BACK BY AND VERIFIED WITH: PHARMD J GADHIA [id number] AT 1300 BY CM    Staphylococcus aureus (BCID) DETECTED (A) NOT DETECTED Final    Comment: Methicillin (oxacillin)-resistant Staphylococcus aureus (MRSA). MRSA is predictably resistant to beta-lactam antibiotics (except ceftaroline). Preferred therapy is vancomycin unless clinically contraindicated. Patient requires contact precautions if  hospitalized. CRITICAL RESULT CALLED TO, READ BACK BY AND VERIFIED WITH: PHARMD J GADHIA [id number] AT 98 BY CM    Staphylococcus epidermidis NOT DETECTED NOT DETECTED Final   Staphylococcus lugdunensis NOT DETECTED NOT DETECTED Final   Streptococcus species NOT DETECTED NOT DETECTED Final   Streptococcus agalactiae NOT DETECTED NOT DETECTED Final   Streptococcus pneumoniae NOT DETECTED NOT DETECTED Final   Streptococcus pyogenes NOT DETECTED NOT DETECTED  Final   A.calcoaceticus-baumannii NOT DETECTED NOT DETECTED Final   Bacteroides fragilis NOT DETECTED NOT DETECTED Final   Enterobacterales NOT DETECTED NOT DETECTED Final   Enterobacter cloacae complex NOT DETECTED NOT DETECTED Final   Escherichia coli NOT DETECTED NOT DETECTED Final   Klebsiella aerogenes NOT DETECTED NOT DETECTED Final   Klebsiella oxytoca NOT DETECTED NOT DETECTED Final   Klebsiella pneumoniae NOT DETECTED NOT DETECTED Final   Proteus species NOT DETECTED NOT DETECTED Final   Salmonella species NOT DETECTED NOT DETECTED Final   Serratia marcescens NOT DETECTED NOT DETECTED Final   Haemophilus influenzae NOT DETECTED NOT DETECTED Final   Neisseria meningitidis NOT DETECTED NOT  DETECTED Final   Pseudomonas aeruginosa NOT DETECTED NOT DETECTED Final   Stenotrophomonas maltophilia NOT DETECTED NOT DETECTED Final   Candida albicans NOT DETECTED NOT DETECTED Final   Candida auris NOT DETECTED NOT DETECTED Final   Candida glabrata NOT DETECTED NOT DETECTED Final   Candida krusei NOT DETECTED NOT DETECTED Final   Candida parapsilosis NOT DETECTED NOT DETECTED Final   Candida tropicalis NOT DETECTED NOT DETECTED Final   Cryptococcus neoformans/gattii NOT DETECTED NOT DETECTED Final   Meth resistant mecA/C and MREJ DETECTED (A) NOT DETECTED Final    Comment: CRITICAL RESULT CALLED TO, READ BACK BY AND VERIFIED WITH: Damian Leavell [id number] AT 1300 BY CM Performed at [facility], [address] [place], [place] [address]   Resp Panel by RT-PCR (Flu A&B, Covid)     Status: Abnormal   Collection Time: 05/29/21  7:19 PM   Specimen: Nasopharyngeal(NP) swabs in vial transport medium  Result Value Ref Range Status   SARS Coronavirus 2 by RT PCR POSITIVE (A) NEGATIVE Final    Comment: (NOTE) SARS-CoV-2 target nucleic acids are DETECTED.  The SARS-CoV-2 RNA is generally detectable in upper respiratory specimens during the acute phase of infection. Positive results are indicative of the presence of the identified virus, but do not rule out bacterial infection or co-infection with other pathogens not detected by the test. Clinical correlation with patient history and other diagnostic information is necessary to determine patient infection status. The expected result is Negative.  Fact Sheet for Patients: EntrepreneurPulse.com.au  Fact Sheet for Healthcare Providers: IncredibleEmployment.be  This test is not yet approved or cleared by the [place] FDA and  has been authorized for detection and/or diagnosis of SARS-CoV-2 by FDA under an Emergency Use Authorization (EUA).  This EUA will remain in effect (meaning this test can  be used) for the duration of  the COVID-19 declaration under Section 564(b)(1) of the A ct, 21 U.S.C. section 360bbb-3(b)(1), unless the authorization is terminated or revoked sooner.     Influenza A by PCR NEGATIVE NEGATIVE Final   Influenza B by PCR NEGATIVE NEGATIVE Final    Comment: (NOTE) The Xpert Xpress SARS-CoV-2/FLU/RSV plus assay is intended as an aid in the diagnosis of influenza from Nasopharyngeal swab specimens and should not be used as a sole basis for treatment. Nasal washings and aspirates are unacceptable for Xpert Xpress SARS-CoV-2/FLU/RSV testing.  Fact Sheet for Patients: EntrepreneurPulse.com.au  Fact Sheet for Healthcare Providers: IncredibleEmployment.be  This test is not yet approved or cleared by the [place] FDA and has been authorized for detection and/or diagnosis of SARS-CoV-2 by FDA under an Emergency Use Authorization (EUA). This EUA will remain in effect (meaning this test can be used) for the duration of the COVID-19 declaration under Section 564(b)(1) of the Act, 21 U.S.C. section  360bbb-3(b)(1), unless the authorization is terminated or revoked.  Performed at [facility], [place] [address], [place], [place] [address]   C Difficile Quick Screen w PCR reflex     Status: None   Collection Time: 05/30/21  8:42 AM   Specimen: STOOL  Result Value Ref Range Status   C Diff antigen NEGATIVE NEGATIVE Final   C Diff toxin NEGATIVE NEGATIVE Final   C Diff interpretation No C. difficile detected.  Final    Comment: Performed at [facility], [place] [address] [place], [place] [address]        Radiology Studies: DG Chest 2 View  Result Date: 05/29/2021 CLINICAL DATA:  Suspected sepsis EXAM: CHEST - 2 VIEW COMPARISON:  05/13/2021 FINDINGS: Right-sided central venous port tip over the SVC. Partial clearing of previously noted patchy lower lung airspace opacities. Normal  cardiomediastinal silhouette. No pneumothorax. IMPRESSION: Partial but incomplete clearing of previously noted patchy bilateral lower lung opacities suspicious for pneumonia. No new airspace disease is seen. Electronically Signed   By: Donavan Foil M.D.   On: 05/29/2021 16:15   CT Head Wo Contrast  Result Date: 05/29/2021 CLINICAL DATA:  Near syncope. Fall. Mental status change, unknown cause EXAM: CT HEAD WITHOUT CONTRAST TECHNIQUE: Contiguous axial images were obtained from the base of the skull through the vertex without intravenous contrast. RADIATION DOSE REDUCTION: This exam was performed according to the departmental dose-optimization program which includes automated exposure control, adjustment of the mA and/or kV according to patient size and/or use of iterative reconstruction technique. COMPARISON:  03/28/2012 FINDINGS: Brain: No acute intracranial abnormality. Specifically, no hemorrhage, hydrocephalus, mass lesion, acute infarction, or significant intracranial injury. Vascular: No hyperdense vessel or unexpected calcification. Skull: No acute calvarial abnormality. Sinuses/Orbits: No acute findings Other: None IMPRESSION: No acute intracranial abnormality. Electronically Signed   By: Rolm Baptise M.D.   On: 05/29/2021 18:38   CT Angio Chest PE W/Cm &/Or Wo Cm  Result Date: 05/29/2021 CLINICAL DATA:  Near syncope.  Fall.  PE suspected. EXAM: CT ANGIOGRAPHY CHEST WITH CONTRAST TECHNIQUE: Multidetector CT imaging of the chest was performed using the standard protocol during bolus administration of intravenous contrast. Multiplanar CT image reconstructions and MIPs were obtained to evaluate the vascular anatomy. RADIATION DOSE REDUCTION: This exam was performed according to the departmental dose-optimization program which includes automated exposure control, adjustment of the mA and/or kV according to patient size and/or use of iterative reconstruction technique. CONTRAST:  37mL OMNIPAQUE IOHEXOL  350 MG/ML SOLN COMPARISON:  Standard CT chest 10/14/2019 FINDINGS: Cardiovascular: The heart size is normal. No substantial pericardial effusion. Coronary artery calcification is evident. Mild atherosclerotic calcification is noted in the wall of the thoracic aorta. There is no filling defect within the opacified pulmonary arteries to suggest the presence of an acute pulmonary embolus. Right Port-A-Cath tip is positioned in the distal SVC. Mediastinum/Nodes: No mediastinal lymphadenopathy. There is no hilar lymphadenopathy. The esophagus has normal imaging features. There is no axillary lymphadenopathy. Lungs/Pleura: Interval development of peripheral architectural distortion and scarring with areas of peripheral ground-glass opacity since prior chest CT and also since PET-CT of 04/15/2021. No focal airspace consolidation. No pleural effusion. No pneumothorax. Paraseptal emphysema noted in the lung apices. Upper Abdomen: The liver shows diffusely decreased attenuation suggesting fat deposition. Tiny fat density lesions in both adrenal glands are consistent with tiny adenomas or myelolipomas, stable. Musculoskeletal: No worrisome lytic or sclerotic osseous abnormality. Old fracture nonunion posterior right tenth rib with healed posterior ninth  rib fracture. Evidence of previous vertebral augmentation at L1, incompletely visualized. Review of the MIP images confirms the above findings. IMPRESSION: 1. No CT evidence for acute pulmonary embolus. 2. Since prior PET-CT of 04/15/2021, the patient has developed peripheral patchy and nodular areas of architectural distortion and ground-glass opacity. Imaging features are suggestive of sequelae of prior atypical infection, including viral etiology. Acute infectious/inflammatory process is not excluded but considered less likely. 3. The liver shows diffusely decreased attenuation suggesting fat deposition. 4.  Emphysema (ICD10-J43.9) and Aortic Atherosclerosis (ICD10-170.0)  Electronically Signed   By: Misty Stanley M.D.   On: 05/29/2021 18:47   CT ABDOMEN PELVIS W CONTRAST  Result Date: 05/30/2021 CLINICAL DATA:  Abdominal pain, lymphoma. EXAM: CT ABDOMEN AND PELVIS WITH CONTRAST TECHNIQUE: Multidetector CT imaging of the abdomen and pelvis was performed using the standard protocol following bolus administration of intravenous contrast. RADIATION DOSE REDUCTION: This exam was performed according to the departmental dose-optimization program which includes automated exposure control, adjustment of the mA and/or kV according to patient size and/or use of iterative reconstruction technique. CONTRAST:  135mL OMNIPAQUE IOHEXOL 300 MG/ML  SOLN COMPARISON:  CT chest 05/29/2021 PET 04/15/2021 and CT angiography abdomen and pelvis 02/25/2021. FINDINGS: Lower chest: Peripheral predominant patchy areas of ground-glass, new from 04/15/2021. Heart size is normal. No pericardial or pleural effusion. Atherosclerotic calcification of the aorta, aortic valve and coronary arteries. Hepatobiliary: Liver is decreased in attenuation diffusely and is enlarged, measuring 20.0 cm. Subcentimeter low-attenuation lesion in the dome of the right hepatic lobe is too small to characterize. Liver and gallbladder are otherwise unremarkable. No biliary ductal dilatation. Pancreas: There are a few scattered calcifications in the pancreas. Spleen: Negative. Adrenals/Urinary Tract: Adrenal glands are unremarkable. 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 Right kidney is unremarkable. Soft tissue fullness is again seen in the left intrarenal collecting system and left renal pelvis. Ureters are decompressed. Bladder is grossly unremarkable. Stomach/Bowel: Stomach, small bowel, appendix and colon are unremarkable. Vascular/Lymphatic: Atherosclerotic calcification of the aorta with an endovascular stent graft in place. Infiltrative appearing soft tissue in the left common iliac  station measures approximately 2.0 cm (2/69), similar. No additional pathologically enlarged lymph nodes. Reproductive: Prostate is visualized. Other: Small left inguinal hernia contains fat. Mesenteries and peritoneum are unremarkable. Musculoskeletal: Degenerative changes in the spine. Old bilateral rib fractures. T12 vertebral body augmentation. No worrisome lytic or sclerotic lesions. IMPRESSION: 1. No acute findings to explain the patient's abdominal pain. 2. Left intrarenal collecting system soft tissue mass and left common iliac nodal mass, consistent with the provided history of lymphoma. Appearance is similar to 02/25/2021. 3. Bibasilar peripheral predominant ground-glass opacities in the lungs may be postinfectious/postinflammatory in etiology, including due to COVID-19. 4. Enlarged steatotic liver. 5. Chronic calcific pancreatitis. 6. Aortic atherosclerosis (ICD10-I70.0). Coronary artery calcification. Electronically Signed   By: Lorin Picket M.D.   On: 05/30/2021 13:52        Scheduled Meds:  [START ON 06/07/2021] allopurinol  100 mg Oral Daily   arformoterol  15 mcg Nebulization BID   aspirin EC  81 mg Oral Q0600   Chlorhexidine Gluconate Cloth  6 each Topical Daily   DULoxetine  60 mg Oral QHS   enoxaparin (LOVENOX) injection  40 mg Subcutaneous Q24H   famciclovir  250 mg Oral BID   insulin aspart  0-9 Units Subcutaneous TID WC   ipratropium-albuterol  3 mL Nebulization Once   pantoprazole  40 mg Oral Daily   phenazopyridine  100 mg Oral TID WC   potassium chloride SA  20 mEq Oral Daily   rosuvastatin  10 mg Oral Daily   sodium chloride (PF)       sodium chloride flush  10-40 mL Intracatheter Q12H   tamsulosin  0.4 mg Oral QPM   Continuous Infusions:  ceFEPime (MAXIPIME) IV 2 g (05/30/21 1358)   lactated ringers 125 mL/hr at 05/30/21 1029   [START ON 05/31/2021] remdesivir 100 mg in NS 100 mL     vancomycin 1,500 mg (05/30/21 1240)     LOS: 0 days     Cordelia Poche,  MD [facility] 05/30/2021, 2:00 PM  If 7PM-7AM, please contact night-coverage [email or web address]

## 2021-05-30 NOTE — TOC Initial Note (Signed)
Transition of Care (TOC) - Initial/Assessment Note    Patient Details  Name: Aaron KRICHBAUM Sr. MRN: [id number] Date of Birth: Apr 10, 1956  [facility]) CM/SW Contact:    Leeroy Cha, RN Phone Number: 05/30/2021, 10:30 AM  Clinical Narrative:                  Transition of Care [facility]) Screening Note   Patient Details  Name: Aaron GALANT Sr. Date of Birth: [birth year]   [facility]) CM/SW Contact:    Leeroy Cha, RN Phone Number: 05/30/2021, 10:30 AM    Transition of Care Department [facility]) has reviewed patient and no TOC needs have been identified at this time. We will continue to monitor patient advancement through interdisciplinary progression rounds. If new patient transition needs arise, please place a TOC consult.    Expected Discharge Plan: Home/Self Care Barriers to Discharge: Continued Medical Work up   Patient Goals and CMS Choice Patient states their goals for this hospitalization and ongoing recovery are:: to go home CMS Medicare.gov Compare Post Acute Care list provided to:: Patient    Expected Discharge Plan and Services Expected Discharge Plan: Home/Self Care   Discharge Planning Services: CM Consult   Living arrangements for the past 2 months: Single Family Home                                      Prior Living Arrangements/Services Living arrangements for the past 2 months: Single Family Home Lives with:: Spouse   Do you feel safe going back to the place where you live?: Yes            Criminal Activity/Legal Involvement Pertinent to Current Situation/Hospitalization: No - Comment as needed  Activities of Daily Living Home Assistive Devices/Equipment: Eyeglasses, Dentures (specify type), CBG Meter ADL Screening (condition at time of admission) Patient's cognitive ability adequate to safely complete daily activities?: Yes Is the patient deaf or have difficulty hearing?: No Does the patient have  difficulty seeing, even when wearing glasses/contacts?: No Does the patient have difficulty concentrating, remembering, or making decisions?: No Patient able to express need for assistance with ADLs?: Yes Does the patient have difficulty dressing or bathing?: No Independently performs ADLs?: Yes (appropriate for developmental age) Does the patient have difficulty walking or climbing stairs?: No Weakness of Legs: Both Weakness of Arms/Hands: None  Permission Sought/Granted                  Emotional Assessment Appearance:: Appears stated age     Orientation: : Oriented to Self, Oriented to Place, Oriented to  Time, Oriented to Situation Alcohol / Substance Use: Not Applicable Psych Involvement: No (comment)  Admission diagnosis:  SIRS (systemic inflammatory response syndrome) ([place]) [R65.10] Patient Active Problem List   Diagnosis Date Noted   SIRS (systemic inflammatory response syndrome) ([place]) 05/29/2021   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 ([place]) 05/04/2021   Goals of care, counseling/discussion 05/04/2021   Soft tissue mass 03/22/2021   COPD (chronic obstructive pulmonary disease) ([place]) 10/14/2020   Hypotension after procedure 07/16/2020   AAA (abdominal aortic aneurysm) 07/12/2020   AAA (abdominal aortic aneurysm) without rupture 06/15/2020   Syncope and collapse 04/03/2012   Hypokalemia 04/03/2012   Chest wall contusion 04/03/2012   Well adult exam 11/27/2011   Restless leg syndrome 07/26/2011   Hip pain, left 03/28/2011  ERECTILE DYSFUNCTION, 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   Lumbago 07/22/2010   Pulmonary nodule 1 cm or greater in diameter 01/07/2009   ABNORMAL CHEST XRAY 01/07/2009   COUGH 12/14/2008   SINUSITIS, ACUTE 06/18/2008   CRAMPS,LEG 06/18/2008   TOBACCO USE DISORDER/SMOKER-SMOKING CESSATION DISCUSSED 04/10/2008   CERUMEN IMPACTION 04/10/2008   CALLUS, TOE 04/10/2008   HYPERLIPIDEMIA 03/14/2007   DEPRESSION 03/14/2007   ALLERGIC  RHINITIS 03/14/2007   DM2 (diabetes mellitus, type 2) ([place]) 01/11/2007   ANXIETY 01/11/2007   Essential hypertension 01/11/2007   PCP:  [name], MD Pharmacy:   [place], [place] [address] Phone: [phone] Fax: [phone]  [facility] ([place], [place] [facility] [address] [place] [address] [place] [address] Phone: [phone] Fax: [place] [address], [place] [address] Phone: [phone] Fax: [phone]     Social Determinants of Health (SDOH) Interventions    Readmission Risk Interventions No flowsheet data found.

## 2021-05-30 NOTE — Assessment & Plan Note (Addendum)
Baseline creatinine of about 0.8-0.9. Creatinine of 1.24 on admission, initially worsened to 1.29 and has now resolved.

## 2021-05-30 NOTE — Assessment & Plan Note (Signed)
Stable. Patient is on Servent, albuterol as an outpatient -Continue Brovana (substitute for Servent) -Duoneb prn

## 2021-05-30 NOTE — Progress Notes (Signed)
PHARMACY - PHYSICIAN COMMUNICATION CRITICAL VALUE ALERT - BLOOD CULTURE IDENTIFICATION (BCID)  Aaron Weaver Sr. is an 66 y.o. male who presented to [facility] on 05/29/2021 with a chief complaint of fall/weakness  Assessment:  76 YOM recently diagnosed with lymphoma and started on chemotherapy with noted neutropenia and concern for sepsis on admission. Now with 1 bottle growing GPC in clusters with BCID detecting MRSA. Possible source includes the patient's port-a-cath, ID will be automatically consulted.   Name of physician (or Provider) Contacted: [place] (automatic ID consult)  Current antibiotics: Vancomycin + Cefepime  Changes to prescribed antibiotics recommended:  D/c Cefepime, continue Vancomycin   Results for orders placed or performed during the hospital encounter of 05/29/21  Blood Culture ID Panel (Reflexed) (Collected: 05/29/2021  3:38 PM)  Result Value Ref Range   Enterococcus faecalis NOT DETECTED NOT DETECTED   Enterococcus Faecium NOT DETECTED NOT DETECTED   Listeria monocytogenes NOT DETECTED NOT DETECTED   Staphylococcus species DETECTED (A) NOT DETECTED   Staphylococcus aureus (BCID) DETECTED (A) NOT DETECTED   Staphylococcus epidermidis NOT DETECTED NOT DETECTED   Staphylococcus lugdunensis NOT DETECTED NOT DETECTED   Streptococcus species NOT DETECTED NOT DETECTED   Streptococcus agalactiae NOT DETECTED NOT DETECTED   Streptococcus pneumoniae NOT DETECTED NOT DETECTED   Streptococcus pyogenes NOT DETECTED NOT DETECTED   A.calcoaceticus-baumannii NOT DETECTED NOT DETECTED   Bacteroides fragilis NOT DETECTED NOT DETECTED   Enterobacterales NOT DETECTED NOT DETECTED   Enterobacter cloacae complex NOT DETECTED NOT DETECTED   Escherichia coli NOT DETECTED NOT DETECTED   Klebsiella aerogenes NOT DETECTED NOT DETECTED   Klebsiella oxytoca NOT DETECTED NOT DETECTED   Klebsiella pneumoniae NOT DETECTED NOT DETECTED   Proteus species NOT DETECTED NOT DETECTED    Salmonella species NOT DETECTED NOT DETECTED   Serratia marcescens NOT DETECTED NOT DETECTED   Haemophilus influenzae NOT DETECTED NOT DETECTED   Neisseria meningitidis NOT DETECTED NOT DETECTED   Pseudomonas aeruginosa NOT DETECTED NOT DETECTED   Stenotrophomonas maltophilia NOT DETECTED NOT DETECTED   Candida albicans NOT DETECTED NOT DETECTED   Candida auris NOT DETECTED NOT DETECTED   Candida glabrata NOT DETECTED NOT DETECTED   Candida krusei NOT DETECTED NOT DETECTED   Candida parapsilosis NOT DETECTED NOT DETECTED   Candida tropicalis NOT DETECTED NOT DETECTED   Cryptococcus neoformans/gattii NOT DETECTED NOT DETECTED   Meth resistant mecA/C and MREJ DETECTED (A) NOT DETECTED    Thank you for allowing pharmacy to be a part of this patients care.  Alycia Rossetti, PharmD, BCPS Clinical Pharmacist 05/30/2021 1:39 PM   **Pharmacist phone directory can now be found on [email or web address] (PW [email or web address]).  Listed under [place].

## 2021-05-30 NOTE — Progress Notes (Addendum)
Opened in error

## 2021-05-30 NOTE — Consult Note (Addendum)
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)   Pancytopenia (HCC)   COVID-19 virus infection    [START ON 06/07/2021] allopurinol  100 mg Oral Daily   arformoterol  15 mcg Nebulization BID   aspirin EC  81 mg Oral Q0600   Chlorhexidine Gluconate Cloth  6 each Topical Daily   DULoxetine  60 mg Oral QHS   enoxaparin (LOVENOX) injection  40 mg Subcutaneous Q24H   famciclovir  250 mg Oral BID   insulin aspart  0-9 Units Subcutaneous TID WC   ipratropium-albuterol  3 mL Nebulization Once   pantoprazole  40 mg Oral Daily   phenazopyridine  100 mg Oral TID WC   potassium chloride SA  20 mEq Oral Daily   rosuvastatin  10 mg Oral Daily   sodium chloride (PF)       sodium chloride flush  10-40 mL Intracatheter Q12H   tamsulosin  0.4 mg Oral QPM    Recommendations: Vancomycin Will stop cefepime Continue remdesivir Will need port-a-cath removed TTE Repeat blood cultures Will d/c enteric precautions  Assessment:  He has one blood culture so far positive for STaph aureus and concern for line related infection.  Will need port a cath removal.  Concern for line related infection.   C diff negative.    Antibiotics: Vancomycin and cefepime  HPI: CAMDYN BESKE Sr. is a 66 y.o. male with recent diagnosis of lymphoma and started chemotherapy on January 13 of this year comes in with weakness.  He is neutropenic and afebrile and now blood cultures positive for Staph aureus in 1 bottle so far.  He is having some shortness of breath and is COVID-19 positive and CT chest with some patchy  infiltrates.  He has had no rashes.     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[place]) 05/04/2021   Dysrhythmia    GERD (gastroesophageal reflux disease)    Goals of care, counseling/discussion 05/04/2021   History of dehydration    Impacted cerumen    Impotence of organic origin    Lumbago    Lung nodule    Other and unspecified hyperlipidemia    Routine general medical examination at a health care facility    Skipped heart beats    Tobacco use disorder    Type II or unspecified type diabetes mellitus without mention of complication, not stated as uncontrolled    type II   Unspecified essential hypertension  Social History   Tobacco Use   Smoking status: Former    Packs/day: 0.50    Years: 53.00    Pack years: 26.50    Types: Cigarettes    Start date: 03/31/1965    Quit date: 07/22/2017    Years since quitting: 3.8   Smokeless tobacco: Never   Tobacco comments:    started at age 8.  Vaping Use   Vaping Use: Never used  Substance Use Topics   Alcohol use: Yes    Comment: rare beer once or twice   Drug use: No    Family History  Problem Relation Age of Onset   Colon cancer Mother 23   Hypertension Father    Coronary artery disease Other        male<60 and male<50  1st degree relative   Colon polyps Brother    Esophageal cancer Neg Hx    Rectal cancer Neg Hx    Stomach cancer Neg Hx      Allergies  Allergen Reactions   Fexofenadine Other (See Comments)    "dries me out too much"   Hydrochlorothiazide Other (See Comments)    REACTION: cramps   Penicillins Hives   Pravastatin Sodium Other (See Comments)    REACTION: aches   Requip [Ropinirole Hcl] Other (See Comments)    Bad dreams    Physical Exam: Constitutional: in no apparent distress  Vitals:   05/30/21 1330 05/30/21 1335  BP: 108/64   Pulse: 87 87  Resp: (!) 24 (!) 27  Temp:    SpO2: 90% 94%   EYES: anicteric ENMT: no thrush Chest: port-a-cath with no surrounding erythyema Cardiovascular: Cor RRR Respiratory: clear; GI: soft, nt Musculoskeletal: no edema Skin: no rashes  Lab Results  Component Value Date   WBC 1.1 (LL) 05/30/2021   HGB 11.5 (L) 05/30/2021   HCT 33.1 (L) 05/30/2021   MCV 81.9 05/30/2021   PLT 56 (L) 05/30/2021    Lab Results  Component Value Date   CREATININE 1.29 (H) 05/30/2021   BUN 18 05/30/2021   NA 132 (L) 05/30/2021   K 3.8 05/30/2021   CL 101 05/30/2021   CO2 20 (L) 05/30/2021    Lab Results  Component Value Date   ALT 21 05/30/2021   AST 17 05/30/2021   ALKPHOS 45 05/30/2021     Microbiology: Recent Results (from the past 240 hour(s))  Culture, blood (Routine x 2)     Status: None (Preliminary result)   Collection Time: 05/29/21  3:38 PM   Specimen: BLOOD  Result Value Ref Range Status   Specimen Description   Final    BLOOD RIGHT ANTECUBITAL Performed at [facility], [place] [address], [place], [place] [address]    Special Requests   Final    BOTTLES DRAWN AEROBIC AND ANAEROBIC Blood Culture adequate volume Performed at [facility] [address] [place], [place] [address]    Culture  Setup Time   Final    GRAM POSITIVE COCCI IN CLUSTERS AEROBIC BOTTLE ONLY CRITICAL RESULT CALLED TO, READ BACK BY AND VERIFIED WITH: Damian Leavell [id number] AT 1559 BY CM Performed at [facility], [place] [address] [place], [place] [address]    Culture [facility] POSITIVE COCCI  Final   Report Status PENDING  Incomplete  Blood Culture ID Panel (Reflexed)     Status: Abnormal   Collection Time: 05/29/21  3:38 PM  Result Value Ref Range Status   Enterococcus  faecalis NOT DETECTED NOT DETECTED Final   Enterococcus Faecium NOT DETECTED NOT DETECTED Final   Listeria monocytogenes NOT DETECTED NOT DETECTED Final   Staphylococcus species DETECTED (A) NOT DETECTED Final    Comment: CRITICAL RESULT CALLED TO, READ BACK BY AND VERIFIED WITH: PHARMD J GADHIA [id number] AT 1300 BY CM    Staphylococcus aureus (BCID) DETECTED (A) NOT DETECTED Final    Comment: Methicillin (oxacillin)-resistant Staphylococcus aureus (MRSA). MRSA is predictably resistant to beta-lactam antibiotics (except ceftaroline). Preferred therapy is vancomycin unless clinically contraindicated. Patient requires contact precautions if  hospitalized. CRITICAL RESULT CALLED TO, READ BACK BY AND VERIFIED WITH: PHARMD J GADHIA [id number] AT 1300 BY CM    Staphylococcus epidermidis NOT DETECTED NOT DETECTED Final   Staphylococcus lugdunensis NOT DETECTED NOT DETECTED Final   Streptococcus species NOT DETECTED NOT DETECTED Final   Streptococcus agalactiae NOT DETECTED NOT DETECTED Final   Streptococcus pneumoniae NOT DETECTED NOT DETECTED Final   Streptococcus pyogenes NOT DETECTED NOT DETECTED Final   A.calcoaceticus-baumannii NOT DETECTED NOT DETECTED Final   Bacteroides fragilis NOT DETECTED NOT DETECTED Final   Enterobacterales NOT DETECTED NOT DETECTED Final   Enterobacter cloacae complex NOT DETECTED NOT DETECTED Final   Escherichia coli NOT DETECTED NOT DETECTED Final   Klebsiella aerogenes NOT DETECTED NOT DETECTED Final   Klebsiella oxytoca NOT DETECTED NOT DETECTED Final   Klebsiella pneumoniae NOT DETECTED NOT DETECTED Final   Proteus species NOT DETECTED NOT DETECTED Final   Salmonella species NOT DETECTED NOT DETECTED Final   Serratia  marcescens NOT DETECTED NOT DETECTED Final   Haemophilus influenzae NOT DETECTED NOT DETECTED Final   Neisseria meningitidis NOT DETECTED NOT DETECTED Final   Pseudomonas aeruginosa NOT DETECTED NOT DETECTED Final   Stenotrophomonas maltophilia NOT DETECTED NOT DETECTED Final   Candida albicans NOT DETECTED NOT DETECTED Final   Candida auris NOT DETECTED NOT DETECTED Final   Candida glabrata NOT DETECTED NOT DETECTED Final   Candida krusei NOT DETECTED NOT DETECTED Final   Candida parapsilosis NOT DETECTED NOT DETECTED Final   Candida tropicalis NOT DETECTED NOT DETECTED Final   Cryptococcus neoformans/gattii NOT DETECTED NOT DETECTED Final   Meth resistant mecA/C and MREJ DETECTED (A) NOT DETECTED Final    Comment: CRITICAL RESULT CALLED TO, READ BACK BY AND VERIFIED WITH: Damian Leavell [id number] AT 1300 BY CM Performed at [facility], [address], [place], [place] [address]   Resp Panel by RT-PCR (Flu A&B, Covid)     Status: Abnormal   Collection Time: 05/29/21  7:19 PM   Specimen: Nasopharyngeal(NP) swabs in vial transport medium  Result Value Ref Range Status   SARS Coronavirus 2 by RT PCR POSITIVE (A) NEGATIVE Final    Comment: (NOTE) SARS-CoV-2 target nucleic acids are DETECTED.  The SARS-CoV-2 RNA is generally detectable in upper respiratory specimens during the acute phase of infection. Positive results are indicative of the presence of the identified virus, but do not rule out bacterial infection or co-infection with other pathogens not detected by the test. Clinical correlation with patient history and other diagnostic information is necessary to determine patient infection status. The expected result is Negative.  Fact Sheet for Patients: EntrepreneurPulse.com.au  Fact Sheet for Healthcare Providers: IncredibleEmployment.be  This test is not yet approved or cleared by the [place] FDA and  has been authorized for  detection and/or diagnosis of SARS-CoV-2 by FDA under an Emergency Use Authorization (EUA).  This EUA will remain in effect (meaning this test  can be used) for the duration of  the COVID-19 declaration under Section 564(b)(1) of the A ct, 21 U.S.C. section 360bbb-3(b)(1), unless the authorization is terminated or revoked sooner.     Influenza A by PCR NEGATIVE NEGATIVE Final   Influenza B by PCR NEGATIVE NEGATIVE Final    Comment: (NOTE) The Xpert Xpress SARS-CoV-2/FLU/RSV plus assay is intended as an aid in the diagnosis of influenza from Nasopharyngeal swab specimens and should not be used as a sole basis for treatment. Nasal washings and aspirates are unacceptable for Xpert Xpress SARS-CoV-2/FLU/RSV testing.  Fact Sheet for Patients: EntrepreneurPulse.com.au  Fact Sheet for Healthcare Providers: IncredibleEmployment.be  This test is not yet approved or cleared by the [place] FDA and has been authorized for detection and/or diagnosis of SARS-CoV-2 by FDA under an Emergency Use Authorization (EUA). This EUA will remain in effect (meaning this test can be used) for the duration of the COVID-19 declaration under Section 564(b)(1) of the Act, 21 U.S.C. section 360bbb-3(b)(1), unless the authorization is terminated or revoked.  Performed at [facility], [place] [address], [place], [place] [address]   C Difficile Quick Screen w PCR reflex     Status: None   Collection Time: 05/30/21  8:42 AM   Specimen: STOOL  Result Value Ref Range Status   C Diff antigen NEGATIVE NEGATIVE Final   C Diff toxin NEGATIVE NEGATIVE Final   C Diff interpretation No C. difficile detected.  Final    Comment: Performed at [facility], [place] [address], [place], [place] [address]    Areli Frary W Cedric Mcclaine, MD [facility] for Infectious Disease [facility] [email or web address] 05/30/2021, 1:52 PM

## 2021-05-30 NOTE — Hospital Course (Addendum)
Aaron Weaver Is a 66 y.o. male with a history of diabetes mellitus type 2, AAA s/p endovascular repair, COPD, lymphoma recently started chemotherapy. Patient presented secondary to fall and weakness and found to have evidence of SIRS with unknown infectious source. Empiric antibiotics started. Blood cultures significant for MRSA. Patient transitioned to Vancomycin monotherapy and ID consulted.

## 2021-05-30 NOTE — Progress Notes (Signed)
°   05/30/21 0810  Vitals  Temp 98.2 F (36.8 C)  Temp Source Oral  BP (!) 107/58  MAP (mmHg) 74  BP Location Right Arm  BP Method Automatic  Patient Position (if appropriate) Lying  Pulse Rate 92  Pulse Rate Source Monitor  Resp (!) 34  MEWS COLOR  MEWS Score Color Yellow  Oxygen Therapy  SpO2 96 %  O2 Device Nasal Cannula  O2 Flow Rate (L/min) 2 L/min  MEWS Score  MEWS Temp 0  MEWS Systolic 0  MEWS Pulse 0  MEWS RR 2  MEWS LOC 0  MEWS Score 2   Patient's MEWS yellow d/t RR 34. MD Nettey notified. When asked if pt felt SOB, pt stated "not too bad." Pt with increased work of breathing, grunting at times. States he needs to urinate but is unable. Believes most of his work of breathing is d/t pain and not SOB. Bladder scan done showing 437 ml. MD Nettey gave VO to I&O- 400 ml returned and patient felt instant relief. O2 Sats stable on RA- 2L/[place] removed and continuous pulse ox applied. BP soft so MX monitor also applied for closer monitoring. C-Diff sample collected and sent to lab. Pt finishing PO contrast now- CT planned for 1000 per CT. Will initiate yellow MEWS protocol and continue to monitor.

## 2021-05-30 NOTE — Progress Notes (Signed)
Pharmacy Antibiotic Note  Aaron Weaver. is a 66 y.o. male admitted on 05/29/2021 with sepsis.  Pharmacy has been consulted for Vancomycin dosing.  Plan: Vancomycin 750mg  IV q12h to target AUC 400-550 Check Vancomycin levels at steady state Cefepime 2gm IV q8h per MD Monitor renal function and cx data   Height: 6' (182.9 cm) Weight: 80.5 kg (177 lb 7.5 oz) IBW/kg (Calculated) : 77.6  Temp (24hrs), Avg:98.5 F (36.9 C), Min:98.4 F (36.9 C), Max:98.5 F (36.9 C)  Recent Labs  Lab 05/29/21 1538 05/29/21 1738  WBC 0.3*  --   CREATININE 1.24  --   LATICACIDVEN 3.8* 2.8*    Estimated Creatinine Clearance: 63.4 mL/min (by C-G formula based on SCr of 1.24 mg/dL).    Allergies  Allergen Reactions   Fexofenadine Other (See Comments)    "dries me out too much"   Hydrochlorothiazide Other (See Comments)    REACTION: cramps   Penicillins Hives   Pravastatin Sodium Other (See Comments)    REACTION: aches   Requip [Ropinirole Hcl] Other (See Comments)    Bad dreams    Antimicrobials this admission: 1/22 Cefepime >>  1/22 Vancomycin >>   Dose adjustments this admission:  Microbiology results: 1/22 BCx:   Thank you for allowing pharmacy to be a part of this patients care.  Netta Cedars PharmD 05/30/2021 12:24 AM

## 2021-05-30 NOTE — Assessment & Plan Note (Addendum)
Secondary to recent chemotherapy. ANC of 800. Received G-CSF on 1/6. Platelets trending down. -CBC daily

## 2021-05-30 NOTE — Assessment & Plan Note (Signed)
Continue Crestor 

## 2021-05-30 NOTE — Assessment & Plan Note (Addendum)
Incidental finding. Remdesivir started on 1/23. No steroids secondary to lack of hypoxia. CRP significantly elevated in setting of bacteremia. -Continue Remdesivir IV -Daily CMP, CBC, CRP

## 2021-05-30 NOTE — Consult Note (Addendum)
[place]  [phone] [phone]   MEDICAL ONCOLOGY - CONSULTATION  Referral MD: Dr. 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  He presented to the emergency department following a fall with weakness.  He has been also experiencing episodes of diarrhea, frequent urination, dysuria.  In the emergency department, his labs showed a WBC of 0.3, hemoglobin 12.8, platelet count 79,000, sodium 130, glucose 428, albumin 3.3, T bili 1.6.  Chest x-ray showed partial but incomplete clearing of the previously noted patchy bilateral lower lung opacities suspicious for pneumonia.  CT head showed no acute intracranial abnormality.  CTA chest negative for PE and since the prior PET scan the patient has developed peripheral patchy and nodular areas of architectural distortion and groundglass opacity which are suggestive of prior atypical infection including viral etiology and acute infectious/inflammatory process is not excluded but considered less likely.  Lactic acid level elevated.  COVID-19 positive.  Blood cultures obtained and are pending.  UA negative for nitrite and leukocytes.  He has been started on cefepime and vancomycin.  He recently started treatment with R-CHOP for his lymphoma.  His first cycle was given on 05/20/2021.  He received G-CSF on 05/23/2021.  Vital signs reviewed and he is not having any fevers.he reports difficulty voiding.  Has required I&O cath x2 already.  Reports overall weakness.  Denies chest pain, shortness of breath.  Reports mild cough.  He has not required any oxygen today.  Denies abdominal pain, nausea, vomiting.  He is having diarrhea.  Stool for C. difficile was negative.  Medical oncology was asked see the patient may recommendations regarding his pancytopenia and lymphoma.  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[place]) 05/04/2021   Dysrhythmia    GERD (gastroesophageal reflux disease)    Goals of care, counseling/discussion 05/04/2021   History of dehydration    Impacted cerumen    Impotence of organic origin    Lumbago    Lung nodule    Other and unspecified hyperlipidemia    Routine general medical examination at a health care facility    Skipped heart beats    Tobacco use disorder    Type II or unspecified type diabetes mellitus without mention of complication, not stated as uncontrolled    type II   Unspecified essential hypertension   :   Past Surgical History:  Procedure Laterality Date   ABDOMINAL AORTIC ENDOVASCULAR STENT GRAFT Bilateral 07/12/2020   Procedure: ABDOMINAL AORTIC ENDOVASCULAR STENT GRAFT;  Surgeon: [name], MD;  Location: [facility];  Service: Vascular;  Laterality: Bilateral;   AXILLARY LYMPH NODE BIOPSY Right 04/27/2021   Procedure: AXILLARY EXCISIONAL LYMPH NODE BIOPSY;  Surgeon: Dwan Bolt, MD;  Location: [facility];  Service: General;  Laterality: Right;   BACK SURGERY  fracture   CARPECTOMY Right 09/11/2019   Procedure: PROXIMAL ROW CARPECTOMY; RADIAL STYLOIDECTOMY; POSTERIOR INTEROSSIUS NERVE RESECTION;  Surgeon: Daryll Brod, MD;  Location: [facility];  Service: Orthopedics;  Laterality: Right;  AXILLARY BLOCK   COLONOSCOPY     INGUINAL HERNIA REPAIR     right   POLYPECTOMY     PORTACATH PLACEMENT Right 05/13/2021   Procedure:  INSERTION PORT-A-CATH;  Surgeon: Dwan Bolt, MD;  Location: [facility];  Service: General;  Laterality: Right;   TONSILLECTOMY    :   Current Facility-Administered Medications  Medication Dose Route Frequency Provider Last Rate Last Admin   acetaminophen (TYLENOL) tablet 650 mg  650 mg Oral Q6H PRN Rise Patience, MD       Or   acetaminophen (TYLENOL) suppository 650 mg  650 mg Rectal Q6H PRN Rise Patience, MD       [START ON 06/07/2021] allopurinol (ZYLOPRIM) tablet 100 mg  100 mg Oral Daily Rise Patience, MD       arformoterol [facility]) nebulizer solution 15 mcg  15 mcg Nebulization BID Rise Patience, MD   15 mcg at 05/30/21 0001   aspirin EC tablet 81 mg  81 mg Oral Q0600 Rise Patience, MD   81 mg at 05/30/21 0453   atenolol (TENORMIN) tablet 100 mg  100 mg Oral Daily Rise Patience, MD       ceFEPIme (MAXIPIME) 2 g in sodium chloride 0.9 % 100 mL IVPB  2 g Intravenous Q8H Rise Patience, MD 200 mL/hr at 05/30/21 0453 2 g at 05/30/21 0453   Chlorhexidine Gluconate Cloth 2 % PADS 6 each  6 each Topical Daily Rise Patience, MD       DULoxetine (CYMBALTA) DR capsule 60 mg  60 mg Oral QHS Rise Patience, MD   60 mg at 05/29/21 2318   enoxaparin (LOVENOX) injection 40 mg  40 mg Subcutaneous Q24H Rise Patience, MD       famciclovir [facility]) tablet 250 mg  250 mg Oral BID Rise Patience, MD   250 mg at 05/29/21 2317   insulin aspart (novoLOG) injection 0-9 Units  0-9 Units Subcutaneous TID WC Rise Patience, MD   5 Units at 05/30/21 1914   lactated ringers infusion   Intravenous Continuous Rise Patience, MD 125 mL/hr at 05/30/21 0453 New Bag at 05/30/21 0453   pantoprazole (PROTONIX) EC tablet 40 mg  40 mg Oral Daily Rise Patience, MD       phenazopyridine (PYRIDIUM) tablet 100 mg  100 mg Oral TID WC Rise Patience, MD   100 mg at 05/30/21 0115   potassium chloride SA (KLOR-CON M) CR tablet 20 mEq  20  mEq Oral Daily Rise Patience, MD       remdesivir 200 mg in sodium chloride 0.9% 250 mL IVPB  200 mg Intravenous Once Gadhia, Jigna M, RPH       Followed by   Derrill Memo ON 05/31/2021] remdesivir 100 mg in sodium chloride 0.9 % 100 mL IVPB  100 mg Intravenous Daily Gadhia, Jigna M, RPH       rosuvastatin (CRESTOR) tablet 10 mg  10 mg Oral Daily Rise Patience, MD       sodium chloride flush (NS) 0.9 % injection 10-40 mL  10-40 mL Intracatheter Q12H Rise Patience, MD   10 mL at 05/30/21 0454   sodium chloride flush (NS) 0.9 % injection 10-40 mL  10-40 mL Intracatheter PRN Rise Patience, MD       tamsulosin [facility]) capsule 0.4 mg  0.4 mg Oral QPM Rise Patience, MD       vancomycin (VANCOREADY) IVPB 750 mg/150 mL  750 mg Intravenous Q12H [place], Baltazar Najjar, [facility]          Allergies  Allergen Reactions   Fexofenadine Other (See Comments)    "dries me out too much"   Hydrochlorothiazide Other (See Comments)    REACTION: cramps   Penicillins Hives   Pravastatin Sodium Other (See Comments)    REACTION: aches   Requip [Ropinirole Hcl] Other (See Comments)    Bad dreams  :   Family History  Problem Relation Age of Onset   Colon cancer Mother 36   Hypertension Father    Coronary artery disease Other        male<60 and male<50  1st degree relative   Colon polyps Brother    Esophageal cancer Neg Hx    Rectal cancer Neg Hx    Stomach cancer Neg Hx   :   Social History   Socioeconomic History   Marital status: Married    Spouse name: Odel Schmid   Number of children: 1   Years of education: Not on file   Highest education level: Not on file  Occupational History   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  Vaping Use   Vaping Use: Never used  Substance and Sexual Activity   Alcohol use: Yes    Comment: rare beer once or twice   Drug use: No   Sexual activity: Yes  Other Topics Concern   Not on file  Social History Narrative   Not on file   Social Determinants of Health   Financial Resource Strain: Not on file  Food Insecurity: Not on file  Transportation Needs: Not on file  Physical Activity: Not on file  Stress: Not on file  Social Connections: Not on file  Intimate Partner Violence: Not on file  :  Review of Systems: A comprehensive 14 point review of systems was negative except as noted in the HPI.  Exam: Patient Vitals for the past 24 hrs:  BP Temp Temp src Pulse Resp SpO2 Height Weight  05/30/21 0631 128/84 97.7 F (36.5 C) Oral 99 (!) 29 96 % -- --  05/30/21 0549 118/80 99 F (37.2 C) Oral 99 20 97 % -- --  05/30/21 0233 108/65 99.9 F (37.7 C) Oral 100 (!) 25 96 % -- --  05/30/21 0019 -- -- -- -- -- 92 % -- --  05/29/21 2201 -- -- -- -- -- -- 6' (1.829 m) 80.5 kg  05/29/21 2149 120/69 98.4 F (36.9 C) Oral 100 (!) 22 94 % -- --  05/29/21 1848 (!) 141/77 -- -- (!) 103 (!) 24 97 % -- --  05/29/21 1800 119/77 -- -- 96 (!) 24 95 % -- --  05/29/21 1745 (!) 121/94 -- -- 100 19 92 % -- --  05/29/21 1715 (!) 149/91 -- -- (!) 103 19 96 % -- --  05/29/21 1535 (!) 141/93 98.5 F (  36.9 C) Oral (!) 107 (!) 26 95 % -- --   Physical Exam Vitals reviewed.  Constitutional:      General: He is not in acute distress. HENT:     Head: Normocephalic.     Mouth/Throat:     Pharynx: No oropharyngeal exudate.  Eyes:     General: No scleral icterus. Cardiovascular:     Rate and Rhythm: Normal rate.  Pulmonary:     Effort: Pulmonary effort is normal.     Comments: Diminished breath sounds Abdominal:     General: Abdomen is flat.     Palpations: Abdomen is soft.  Musculoskeletal:     Right lower leg: No edema.     Left lower leg: No edema.  Skin:    General: Skin  is warm and dry.  Neurological:     General: No focal deficit present.     Mental Status: He is alert and oriented to person, place, and time.  Psychiatric:        Mood and Affect: Mood normal.        Behavior: Behavior normal.        Thought Content: Thought content normal.        Judgment: Judgment normal.     Lab Results  Component Value Date   WBC 1.1 (LL) 05/30/2021   HGB 11.5 (L) 05/30/2021   HCT 33.1 (L) 05/30/2021   PLT 56 (L) 05/30/2021   GLUCOSE 304 (H) 05/30/2021   CHOL 117 11/23/2011   TRIG 85.0 11/23/2011   HDL 35.10 (L) 11/23/2011   LDLDIRECT 73.2 07/19/2010   LDLCALC 65 11/23/2011   ALT 21 05/30/2021   AST 17 05/30/2021   NA 132 (L) 05/30/2021   K 3.8 05/30/2021   CL 101 05/30/2021   CREATININE 1.29 (H) 05/30/2021   BUN 18 05/30/2021   CO2 20 (L) 05/30/2021    DG Chest 2 View  Result Date: 05/29/2021 CLINICAL DATA:  Suspected sepsis EXAM: CHEST - 2 VIEW COMPARISON:  05/13/2021 FINDINGS: Right-sided central venous port tip over the SVC. Partial clearing of previously noted patchy lower lung airspace opacities. Normal cardiomediastinal silhouette. No pneumothorax. IMPRESSION: Partial but incomplete clearing of previously noted patchy bilateral lower lung opacities suspicious for pneumonia. No new airspace disease is seen. Electronically Signed   By: Donavan Foil M.D.   On: 05/29/2021 16:15   CT Head Wo Contrast  Result Date: 05/29/2021 CLINICAL DATA:  Near syncope. Fall. Mental status change, unknown cause EXAM: CT HEAD WITHOUT CONTRAST TECHNIQUE: Contiguous axial images were obtained from the base of the skull through the vertex without intravenous contrast. RADIATION DOSE REDUCTION: This exam was performed according to the departmental dose-optimization program which includes automated exposure control, adjustment of the mA and/or kV according to patient size and/or use of iterative reconstruction technique. COMPARISON:  03/28/2012 FINDINGS: Brain: No acute  intracranial abnormality. Specifically, no hemorrhage, hydrocephalus, mass lesion, acute infarction, or significant intracranial injury. Vascular: No hyperdense vessel or unexpected calcification. Skull: No acute calvarial abnormality. Sinuses/Orbits: No acute findings Other: None IMPRESSION: No acute intracranial abnormality. Electronically Signed   By: Rolm Baptise M.D.   On: 05/29/2021 18:38   CT Angio Chest PE W/Cm &/Or Wo Cm  Result Date: 05/29/2021 CLINICAL DATA:  Near syncope.  Fall.  PE suspected. EXAM: CT ANGIOGRAPHY CHEST WITH CONTRAST TECHNIQUE: Multidetector CT imaging of the chest was performed using the standard protocol during bolus administration of intravenous contrast. Multiplanar CT image reconstructions and MIPs were obtained to  evaluate the vascular anatomy. RADIATION DOSE REDUCTION: This exam was performed according to the departmental dose-optimization program which includes automated exposure control, adjustment of the mA and/or kV according to patient size and/or use of iterative reconstruction technique. CONTRAST:  72mL OMNIPAQUE IOHEXOL 350 MG/ML SOLN COMPARISON:  Standard CT chest 10/14/2019 FINDINGS: Cardiovascular: The heart size is normal. No substantial pericardial effusion. Coronary artery calcification is evident. Mild atherosclerotic calcification is noted in the wall of the thoracic aorta. There is no filling defect within the opacified pulmonary arteries to suggest the presence of an acute pulmonary embolus. Right Port-A-Cath tip is positioned in the distal SVC. Mediastinum/Nodes: No mediastinal lymphadenopathy. There is no hilar lymphadenopathy. The esophagus has normal imaging features. There is no axillary lymphadenopathy. Lungs/Pleura: Interval development of peripheral architectural distortion and scarring with areas of peripheral ground-glass opacity since prior chest CT and also since PET-CT of 04/15/2021. No focal airspace consolidation. No pleural effusion. No  pneumothorax. Paraseptal emphysema noted in the lung apices. Upper Abdomen: The liver shows diffusely decreased attenuation suggesting fat deposition. Tiny fat density lesions in both adrenal glands are consistent with tiny adenomas or myelolipomas, stable. Musculoskeletal: No worrisome lytic or sclerotic osseous abnormality. Old fracture nonunion posterior right tenth rib with healed posterior ninth rib fracture. Evidence of previous vertebral augmentation at L1, incompletely visualized. Review of the MIP images confirms the above findings. IMPRESSION: 1. No CT evidence for acute pulmonary embolus. 2. Since prior PET-CT of 04/15/2021, the patient has developed peripheral patchy and nodular areas of architectural distortion and ground-glass opacity. Imaging features are suggestive of sequelae of prior atypical infection, including viral etiology. Acute infectious/inflammatory process is not excluded but considered less likely. 3. The liver shows diffusely decreased attenuation suggesting fat deposition. 4.  Emphysema (ICD10-J43.9) and Aortic Atherosclerosis (ICD10-170.0) Electronically Signed   By: Misty Stanley M.D.   On: 05/29/2021 18:47   DG BONE DENSITY (DXA)  Result Date: 05/11/2021 EXAM: DUAL X-RAY ABSORPTIOMETRY (DXA) FOR BONE MINERAL DENSITY IMPRESSION: Referring Physician:  [name] Your patient completed a bone mineral density test using GE Lunar iDXA system (analysis version: 16). Technologist: [place] PATIENT: Name: Aaron Weaver, Aaron Weaver Patient ID: [id number] Birth Date: 10-11-1955 Height: 71.0 in. Sex: Male Measured: 05/11/2021 Weight: 198.0 lbs. Indications: Caucasian, Diabetic non insulin Fractures: Right Wrist, Vertebrae Treatments: Calcium (E943.0), Fosamax, Vitamin D (E933.5) ASSESSMENT: The BMD measured at Femur Neck Left is 0.920 g/cm2 with a T-score of -0.8. This patient is considered normal according to [facility]) criteria. The quality of the exam is good. The lumbar spine was  excluded due to degenerative and surgical changes. Site Region Measured Date Measured Age [age]A BMD Significant CHANGE T-score DualFemur Neck Left 05/11/2019 67.1 -0.6 0.920 g/cm2 DualFemur Neck Left 11/06/2018 64.6 -1.0 0.897 g/cm2 DualFemur Total Mean 05/11/2021 67.1 0.5 1.069 g/cm2 * DualFemur Total Mean 11/06/2018 64.6 0.1 1.017 g/cm2 Left Forearm Radius 33% 05/11/2021 67.1 -0.3 0.959 g/cm2 Left Forearm Radius 33% 11/06/2018 64.6 -0.6 0.947 g/cm2 World Health Organization [facility] criteria for post-menopausal, Caucasian Women: Normal       T-score at or above -1 SD Osteopenia   T-score between -1 and -2.5 SD Osteoporosis T-score at or below -2.5 SD RECOMMENDATION: [place] recommends that FDA-approved medical therapies be considered in postmenopausal women and men age 57 or older with a: 1. Hip or vertebral (clinical or morphometric) fracture. 2. T-score of less than or equal to -2.5 at the spine or hip. 3. Ten-year fracture probability by FRAX of 3%  or greater for hip fracture or 20% or greater for major osteoporotic fracture. All treatment decisions require clinical judgment and consideration of individual patient factors, including patient preferences, co-morbidities, previous drug use, risk factors not captured in the FRAX model (e.g. falls, vitamin D deficiency, increased bone turnover, interval significant decline in bone density) and possible under- or over-estimation of fracture risk by FRAX. All patients should ensure an adequate intake of dietary calcium (1200 mg/d) and vitamin D (800 IU daily) unless contraindicated. FOLLOW-UP: People with diagnosed cases of osteoporosis or osteopenia should be regularly tested for bone mineral density. For patients eligible for Medicare, routine testing is allowed once every 2 years. The testing frequency can be increased to one year for patients who have rapidly progressing disease, or for those who are receiving medical therapy to restore bone  mass. I have reviewed this study and agree with the findings. [facility] Electronically Signed   By: Elmer Picker M.D.   On: 05/11/2021 08:42   DG CHEST PORT 1 VIEW  Result Date: 05/13/2021 CLINICAL DATA:  Placement of right chest port EXAM: PORTABLE CHEST 1 VIEW COMPARISON:  07/12/2020 FINDINGS: Cardiac size is within normal limits. There is interval appearance of patchy infiltrates in the parahilar regions and lower lung fields suggesting possible multifocal pneumonia. Possibility of underlying scarring is not excluded. There is no significant pleural effusion or pneumothorax. There is interval placement of right subclavian chest port with its tip in the superior vena cava. There is no significant pleural effusion or pneumothorax. Surgical clips are seen in the right chest wall and right axilla. IMPRESSION: Interval appearance of extensive patchy infiltrates in both lungs, more so in the lower lung fields suggesting possible multifocal pneumonia. Electronically Signed   By: Elmer Picker M.D.   On: 05/13/2021 09:03   DG C-Arm 1-60 Min  Result Date: 05/13/2021 CLINICAL DATA:  Fluoroscopic assistance for placement of chest port EXAM: DG C-ARM 1-60 MIN FLUOROSCOPY TIME:  Fluoroscopy Time:  12 seconds Number of Acquired Spot Images: 1 COMPARISON:  None. FINDINGS: Fluoroscopic assistance was provided for placement of right chest port IMPRESSION: Fluoroscopic assistance was provided for placement of right chest port. Electronically Signed   By: Elmer Picker M.D.   On: 05/13/2021 08:43   ECHOCARDIOGRAM COMPLETE  Result Date: 05/19/2021    ECHOCARDIOGRAM REPORT   Patient Name:   Aaron BARTNIK Sr. Date of Exam: 05/19/2021 Medical Rec #:  [id number]          Height:       72.0 in Accession #:    [id number]         Weight:       198.0 lb Date of Birth:  10/28/1955         BSA:          2.121 m Patient Age:    56 years           BP:           153/87 mmHg Patient Gender: M                   HR:           63 bpm. Exam Location:  Outpatient Procedure: 3D Echo, 2D Echo, Cardiac Doppler, Color Doppler and Strain Analysis Indications:    Z51.11 Encounter for antineoplastic chemotheraphy  History:        Patient has prior history of Echocardiogram examinations, most  recent 06/20/2018. COPD, Signs/Symptoms:Chest Pain; Risk                 Factors:Hypertension, Diabetes, Dyslipidemia and Current Smoker.                 Lymphoma.  Sonographer:    Roseanna Rainbow RDCS Referring Phys: [place] Comments: Technically difficult study due to poor echo windows. Image acquisition challenging due to COPD and Image acquisition challenging due to respiratory motion. IMPRESSIONS  1. Left ventricular ejection fraction by 3D volume is 59 %. The left ventricle has normal function. The left ventricle has no regional wall motion abnormalities. Left ventricular diastolic parameters are consistent with Grade I diastolic dysfunction (impaired relaxation). The average left ventricular global longitudinal strain is -22.2 %. The global longitudinal strain is normal.  2. Right ventricular systolic function is normal. The right ventricular size is normal.  3. The mitral valve is normal in structure. No evidence of mitral valve regurgitation. No evidence of mitral stenosis.  4. The aortic valve is normal in structure. Aortic valve regurgitation is trivial. No aortic stenosis is present.  5. The inferior vena cava is normal in size with greater than 50% respiratory variability, suggesting right atrial pressure of 3 mmHg. Comparison(s): No significant change from prior study. Prior images reviewed side by side. FINDINGS  Left Ventricle: Left ventricular ejection fraction by 3D volume is 59 %. The left ventricle has normal function. The left ventricle has no regional wall motion abnormalities. The average left ventricular global longitudinal strain is -22.2 %. The global  longitudinal strain is  normal. The left ventricular internal cavity size was normal in size. There is no left ventricular hypertrophy. Left ventricular diastolic parameters are consistent with Grade I diastolic dysfunction (impaired relaxation). Right Ventricle: The right ventricular size is normal. No increase in right ventricular wall thickness. Right ventricular systolic function is normal. Left Atrium: Left atrial size was normal in size. Right Atrium: Right atrial size was normal in size. Pericardium: There is no evidence of pericardial effusion. Mitral Valve: The mitral valve is normal in structure. No evidence of mitral valve regurgitation. No evidence of mitral valve stenosis. Tricuspid Valve: The tricuspid valve is normal in structure. Tricuspid valve regurgitation is not demonstrated. No evidence of tricuspid stenosis. Aortic Valve: The aortic valve is normal in structure. Aortic valve regurgitation is trivial. No aortic stenosis is present. Aortic valve mean gradient measures 3.0 mmHg. Aortic valve peak gradient measures 5.5 mmHg. Aortic valve area, by VTI measures 3.22 cm. Pulmonic Valve: The pulmonic valve was normal in structure. Pulmonic valve regurgitation is not visualized. No evidence of pulmonic stenosis. Aorta: The aortic root is normal in size and structure. Venous: The inferior vena cava is normal in size with greater than 50% respiratory variability, suggesting right atrial pressure of 3 mmHg. IAS/Shunts: No atrial level shunt detected by color flow Doppler.  LEFT VENTRICLE PLAX 2D LVIDd:         4.30 cm         Diastology LVIDs:         2.90 cm         LV e' medial:    4.24 cm/s LV PW:         1.05 cm         LV E/e' medial:  15.3 LV IVS:        1.10 cm         LV e' lateral:   7.18 cm/s  LVOT diam:     2.40 cm         LV E/e' lateral: 9.1 LV SV:         76 LV SV Index:   36              2D LVOT Area:     4.52 cm        Longitudinal                                Strain                                2D Strain GLS   -22.2 % LV Volumes (MOD)               Avg: LV vol d, MOD    76.2 ml A2C:                           3D Volume EF LV vol d, MOD    94.4 ml       LV 3D EF:    Left A4C:                                        ventricul LV vol s, MOD    32.2 ml                    ar A2C:                                        ejection LV vol s, MOD    35.2 ml                    fraction A4C:                                        by 3D LV SV MOD A2C:   44.0 ml                    volume is LV SV MOD A4C:   94.4 ml                    59 %. LV SV MOD BP:    52.3 ml                                 3D Volume EF:                                3D EF:        59 %                                LV EDV:       143 ml  LV ESV:       59 ml                                LV SV:        84 ml RIGHT VENTRICLE             IVC RV S prime:     11.60 cm/s  IVC diam: 1.80 cm TAPSE (M-mode): 1.5 cm LEFT ATRIUM             Index        RIGHT ATRIUM           Index LA diam:        2.90 cm 1.37 cm/m   RA Area:     10.10 cm LA Vol (A2C):   36.7 ml 17.30 ml/m  RA Volume:   19.80 ml  9.33 ml/m LA Vol (A4C):   26.8 ml 12.63 ml/m LA Biplane Vol: 32.5 ml 15.32 ml/m  AORTIC VALVE AV Area (Vmax):    3.41 cm AV Area (Vmean):   3.52 cm AV Area (VTI):     3.22 cm AV Vmax:           117.00 cm/s AV Vmean:          71.700 cm/s AV VTI:            0.236 m AV Peak Grad:      5.5 mmHg AV Mean Grad:      3.0 mmHg LVOT Vmax:         88.30 cm/s LVOT Vmean:        55.800 cm/s LVOT VTI:          0.168 m LVOT/AV VTI ratio: 0.71  AORTA Ao Root diam: 4.00 cm Ao Asc diam:  3.80 cm MITRAL VALVE MV Area (PHT): 2.73 cm    SHUNTS MV Decel Time: 278 msec    Systemic VTI:  0.17 m MV E velocity: 65.00 cm/s  Systemic Diam: 2.40 cm MV A velocity: 80.50 cm/s MV E/A ratio:  0.81 Candee Furbish MD Electronically signed by Candee Furbish MD Signature Date/Time: 05/19/2021/10:37:57 AM    Final      DG Chest 2 View  Result Date: 05/29/2021 CLINICAL DATA:   Suspected sepsis EXAM: CHEST - 2 VIEW COMPARISON:  05/13/2021 FINDINGS: Right-sided central venous port tip over the SVC. Partial clearing of previously noted patchy lower lung airspace opacities. Normal cardiomediastinal silhouette. No pneumothorax. IMPRESSION: Partial but incomplete clearing of previously noted patchy bilateral lower lung opacities suspicious for pneumonia. No new airspace disease is seen. Electronically Signed   By: Donavan Foil M.D.   On: 05/29/2021 16:15   CT Head Wo Contrast  Result Date: 05/29/2021 CLINICAL DATA:  Near syncope. Fall. Mental status change, unknown cause EXAM: CT HEAD WITHOUT CONTRAST TECHNIQUE: Contiguous axial images were obtained from the base of the skull through the vertex without intravenous contrast. RADIATION DOSE REDUCTION: This exam was performed according to the departmental dose-optimization program which includes automated exposure control, adjustment of the mA and/or kV according to patient size and/or use of iterative reconstruction technique. COMPARISON:  03/28/2012 FINDINGS: Brain: No acute intracranial abnormality. Specifically, no hemorrhage, hydrocephalus, mass lesion, acute infarction, or significant intracranial injury. Vascular: No hyperdense vessel or unexpected calcification. Skull: No acute calvarial abnormality. Sinuses/Orbits: No acute findings Other: None IMPRESSION: No acute intracranial abnormality. Electronically Signed   By: Rolm Baptise M.D.  On: 05/29/2021 18:38   CT Angio Chest PE W/Cm &/Or Wo Cm  Result Date: 05/29/2021 CLINICAL DATA:  Near syncope.  Fall.  PE suspected. EXAM: CT ANGIOGRAPHY CHEST WITH CONTRAST TECHNIQUE: Multidetector CT imaging of the chest was performed using the standard protocol during bolus administration of intravenous contrast. Multiplanar CT image reconstructions and MIPs were obtained to evaluate the vascular anatomy. RADIATION DOSE REDUCTION: This exam was performed according to the departmental  dose-optimization program which includes automated exposure control, adjustment of the mA and/or kV according to patient size and/or use of iterative reconstruction technique. CONTRAST:  39mL OMNIPAQUE IOHEXOL 350 MG/ML SOLN COMPARISON:  Standard CT chest 10/14/2019 FINDINGS: Cardiovascular: The heart size is normal. No substantial pericardial effusion. Coronary artery calcification is evident. Mild atherosclerotic calcification is noted in the wall of the thoracic aorta. There is no filling defect within the opacified pulmonary arteries to suggest the presence of an acute pulmonary embolus. Right Port-A-Cath tip is positioned in the distal SVC. Mediastinum/Nodes: No mediastinal lymphadenopathy. There is no hilar lymphadenopathy. The esophagus has normal imaging features. There is no axillary lymphadenopathy. Lungs/Pleura: Interval development of peripheral architectural distortion and scarring with areas of peripheral ground-glass opacity since prior chest CT and also since PET-CT of 04/15/2021. No focal airspace consolidation. No pleural effusion. No pneumothorax. Paraseptal emphysema noted in the lung apices. Upper Abdomen: The liver shows diffusely decreased attenuation suggesting fat deposition. Tiny fat density lesions in both adrenal glands are consistent with tiny adenomas or myelolipomas, stable. Musculoskeletal: No worrisome lytic or sclerotic osseous abnormality. Old fracture nonunion posterior right tenth rib with healed posterior ninth rib fracture. Evidence of previous vertebral augmentation at L1, incompletely visualized. Review of the MIP images confirms the above findings. IMPRESSION: 1. No CT evidence for acute pulmonary embolus. 2. Since prior PET-CT of 04/15/2021, the patient has developed peripheral patchy and nodular areas of architectural distortion and ground-glass opacity. Imaging features are suggestive of sequelae of prior atypical infection, including viral etiology. Acute  infectious/inflammatory process is not excluded but considered less likely. 3. The liver shows diffusely decreased attenuation suggesting fat deposition. 4.  Emphysema (ICD10-J43.9) and Aortic Atherosclerosis (ICD10-170.0) Electronically Signed   By: Misty Stanley M.D.   On: 05/29/2021 18:47   DG BONE DENSITY (DXA)  Result Date: 05/11/2021 EXAM: DUAL X-RAY ABSORPTIOMETRY (DXA) FOR BONE MINERAL DENSITY IMPRESSION: Referring Physician:  [name] Your patient completed a bone mineral density test using GE Lunar iDXA system (analysis version: 16). Technologist: [place] PATIENT: Name: Aaron Weaver, Aaron Weaver Patient ID: [id number] Birth Date: 1956-04-02 Height: 71.0 in. Sex: Male Measured: 05/11/2021 Weight: 198.0 lbs. Indications: Caucasian, Diabetic non insulin Fractures: Right Wrist, Vertebrae Treatments: Calcium (E943.0), Fosamax, Vitamin D (E933.5) ASSESSMENT: The BMD measured at Femur Neck Left is 0.920 g/cm2 with a T-score of -0.8. This patient is considered normal according to [facility]) criteria. The quality of the exam is good. The lumbar spine was excluded due to degenerative and surgical changes. Site Region Measured Date Measured Age YA BMD Significant CHANGE T-score DualFemur Neck Left 3/11/2019 67.1 -0.6 0.920 g/cm2 DualFemur Neck Left 11/06/2018 64.6 -1.0 0.897 g/cm2 DualFemur Total Mean 05/11/2021 67.1 0.5 1.069 g/cm2 * DualFemur Total Mean 11/06/2018 64.6 0.1 1.017 g/cm2 Left Forearm Radius 33% 05/11/2021 67.1 -0.3 0.959 g/cm2 Left Forearm Radius 33% 11/06/2018 64.6 -0.6 0.947 g/cm2 World Health Organization [facility]) criteria for post-menopausal, Caucasian Women: Normal       T-score at or above -1 SD Osteopenia   T-score between -1 and -  2.5 SD Osteoporosis T-score at or below -2.5 SD RECOMMENDATION: [place] recommends that FDA-approved medical therapies be considered in postmenopausal women and men age 21 or older with a: 1. Hip or vertebral (clinical or  morphometric) fracture. 2. T-score of less than or equal to -2.5 at the spine or hip. 3. Ten-year fracture probability by FRAX of 3% or greater for hip fracture or 20% or greater for major osteoporotic fracture. All treatment decisions require clinical judgment and consideration of individual patient factors, including patient preferences, co-morbidities, previous drug use, risk factors not captured in the FRAX model (e.g. falls, vitamin D deficiency, increased bone turnover, interval significant decline in bone density) and possible under- or over-estimation of fracture risk by FRAX. All patients should ensure an adequate intake of dietary calcium (1200 mg/d) and vitamin D (800 IU daily) unless contraindicated. FOLLOW-UP: People with diagnosed cases of osteoporosis or osteopenia should be regularly tested for bone mineral density. For patients eligible for Medicare, routine testing is allowed once every 2 years. The testing frequency can be increased to one year for patients who have rapidly progressing disease, or for those who are receiving medical therapy to restore bone mass. I have reviewed this study and agree with the findings. [facility] Electronically Signed   By: Elmer Picker M.D.   On: 05/11/2021 08:42   DG CHEST PORT 1 VIEW  Result Date: 05/13/2021 CLINICAL DATA:  Placement of right chest port EXAM: PORTABLE CHEST 1 VIEW COMPARISON:  07/12/2020 FINDINGS: Cardiac size is within normal limits. There is interval appearance of patchy infiltrates in the parahilar regions and lower lung fields suggesting possible multifocal pneumonia. Possibility of underlying scarring is not excluded. There is no significant pleural effusion or pneumothorax. There is interval placement of right subclavian chest port with its tip in the superior vena cava. There is no significant pleural effusion or pneumothorax. Surgical clips are seen in the right chest wall and right axilla. IMPRESSION: Interval  appearance of extensive patchy infiltrates in both lungs, more so in the lower lung fields suggesting possible multifocal pneumonia. Electronically Signed   By: Elmer Picker M.D.   On: 05/13/2021 09:03   DG C-Arm 1-60 Min  Result Date: 05/13/2021 CLINICAL DATA:  Fluoroscopic assistance for placement of chest port EXAM: DG C-ARM 1-60 MIN FLUOROSCOPY TIME:  Fluoroscopy Time:  12 seconds Number of Acquired Spot Images: 1 COMPARISON:  None. FINDINGS: Fluoroscopic assistance was provided for placement of right chest port IMPRESSION: Fluoroscopic assistance was provided for placement of right chest port. Electronically Signed   By: Elmer Picker M.D.   On: 05/13/2021 08:43   ECHOCARDIOGRAM COMPLETE  Result Date: 05/19/2021    ECHOCARDIOGRAM REPORT   Patient Name:   Aaron KENNER Sr. Date of Exam: 05/19/2021 Medical Rec #:  [id number]          Height:       72.0 in Accession #:    [id number]         Weight:       198.0 lb Date of Birth:  [birth year]         BSA:          2.121 m Patient Age:    86 years           BP:           153/87 mmHg Patient Gender: M                  HR:  63 bpm. Exam Location:  Outpatient Procedure: 3D Echo, 2D Echo, Cardiac Doppler, Color Doppler and Strain Analysis Indications:    Z51.11 Encounter for antineoplastic chemotheraphy  History:        Patient has prior history of Echocardiogram examinations, most                 recent 06/20/2018. COPD, Signs/Symptoms:Chest Pain; Risk                 Factors:Hypertension, Diabetes, Dyslipidemia and Current Smoker.                 Lymphoma.  Sonographer:    Roseanna Rainbow RDCS Referring Phys: [place] Comments: Technically difficult study due to poor echo windows. Image acquisition challenging due to COPD and Image acquisition challenging due to respiratory motion. IMPRESSIONS  1. Left ventricular ejection fraction by 3D volume is 59 %. The left ventricle has normal function. The left ventricle has no  regional wall motion abnormalities. Left ventricular diastolic parameters are consistent with Grade I diastolic dysfunction (impaired relaxation). The average left ventricular global longitudinal strain is -22.2 %. The global longitudinal strain is normal.  2. Right ventricular systolic function is normal. The right ventricular size is normal.  3. The mitral valve is normal in structure. No evidence of mitral valve regurgitation. No evidence of mitral stenosis.  4. The aortic valve is normal in structure. Aortic valve regurgitation is trivial. No aortic stenosis is present.  5. The inferior vena cava is normal in size with greater than 50% respiratory variability, suggesting right atrial pressure of 3 mmHg. Comparison(s): No significant change from prior study. Prior images reviewed side by side. FINDINGS  Left Ventricle: Left ventricular ejection fraction by 3D volume is 59 %. The left ventricle has normal function. The left ventricle has no regional wall motion abnormalities. The average left ventricular global longitudinal strain is -22.2 %. The global  longitudinal strain is normal. The left ventricular internal cavity size was normal in size. There is no left ventricular hypertrophy. Left ventricular diastolic parameters are consistent with Grade I diastolic dysfunction (impaired relaxation). Right Ventricle: The right ventricular size is normal. No increase in right ventricular wall thickness. Right ventricular systolic function is normal. Left Atrium: Left atrial size was normal in size. Right Atrium: Right atrial size was normal in size. Pericardium: There is no evidence of pericardial effusion. Mitral Valve: The mitral valve is normal in structure. No evidence of mitral valve regurgitation. No evidence of mitral valve stenosis. Tricuspid Valve: The tricuspid valve is normal in structure. Tricuspid valve regurgitation is not demonstrated. No evidence of tricuspid stenosis. Aortic Valve: The aortic valve is  normal in structure. Aortic valve regurgitation is trivial. No aortic stenosis is present. Aortic valve mean gradient measures 3.0 mmHg. Aortic valve peak gradient measures 5.5 mmHg. Aortic valve area, by VTI measures 3.22 cm. Pulmonic Valve: The pulmonic valve was normal in structure. Pulmonic valve regurgitation is not visualized. No evidence of pulmonic stenosis. Aorta: The aortic root is normal in size and structure. Venous: The inferior vena cava is normal in size with greater than 50% respiratory variability, suggesting right atrial pressure of 3 mmHg. IAS/Shunts: No atrial level shunt detected by color flow Doppler.  LEFT VENTRICLE PLAX 2D LVIDd:         4.30 cm         Diastology LVIDs:         2.90 cm  LV e' medial:    4.24 cm/s LV PW:         1.05 cm         LV E/e' medial:  15.3 LV IVS:        1.10 cm         LV e' lateral:   7.18 cm/s LVOT diam:     2.40 cm         LV E/e' lateral: 9.1 LV SV:         76 LV SV Index:   36              2D LVOT Area:     4.52 cm        Longitudinal                                Strain                                2D Strain GLS  -22.2 % LV Volumes (MOD)               Avg: LV vol d, MOD    76.2 ml A2C:                           3D Volume EF LV vol d, MOD    94.4 ml       LV 3D EF:    Left A4C:                                        ventricul LV vol s, MOD    32.2 ml                    ar A2C:                                        ejection LV vol s, MOD    35.2 ml                    fraction A4C:                                        by 3D LV SV MOD A2C:   44.0 ml                    volume is LV SV MOD A4C:   94.4 ml                    59 %. LV SV MOD BP:    52.3 ml                                 3D Volume EF:                                3D EF:        59 %  LV EDV:       143 ml                                LV ESV:       59 ml                                LV SV:        84 ml RIGHT VENTRICLE             IVC RV S prime:      11.60 cm/s  IVC diam: 1.80 cm TAPSE (M-mode): 1.5 cm LEFT ATRIUM             Index        RIGHT ATRIUM           Index LA diam:        2.90 cm 1.37 cm/m   RA Area:     10.10 cm LA Vol (A2C):   36.7 ml 17.30 ml/m  RA Volume:   19.80 ml  9.33 ml/m LA Vol (A4C):   26.8 ml 12.63 ml/m LA Biplane Vol: 32.5 ml 15.32 ml/m  AORTIC VALVE AV Area (Vmax):    3.41 cm AV Area (Vmean):   3.52 cm AV Area (VTI):     3.22 cm AV Vmax:           117.00 cm/s AV Vmean:          71.700 cm/s AV VTI:            0.236 m AV Peak Grad:      5.5 mmHg AV Mean Grad:      3.0 mmHg LVOT Vmax:         88.30 cm/s LVOT Vmean:        55.800 cm/s LVOT VTI:          0.168 m LVOT/AV VTI ratio: 0.71  AORTA Ao Root diam: 4.00 cm Ao Asc diam:  3.80 cm MITRAL VALVE MV Area (PHT): 2.73 cm    SHUNTS MV Decel Time: 278 msec    Systemic VTI:  0.17 m MV E velocity: 65.00 cm/s  Systemic Diam: 2.40 cm MV A velocity: 80.50 cm/s MV E/A ratio:  0.81 Candee Furbish MD Electronically signed by Candee Furbish MD Signature Date/Time: 05/19/2021/10:37:57 AM    Final     Assessment and Plan:  1.  Diffuse large cell non-Hodgkin's lymphoma 2.  Pancytopenia secondary to chemotherapy 3. SIRS 4.  COVID-19 positive 5.  Diarrhea 6.  Diabetes mellitus 7.  COPD 8.  History of hypertension  -The patient received his first cycle of chemotherapy on 05/20/2021.  Received G-CSF 05/13/2021.  We will reevaluate as an outpatient prior to next cycle of chemo. -He has developed pancytopenia secondary to recent chemotherapy.  He has already received G-CSF.  Monitor WBC and ANC.  Transfuse PRBCs for hemoglobin less than 8 and transfuse platelets for platelet count less than 20,000 or active bleeding. -Continue empiric antibiotics.  Follow-up on cultures. -Remdesivir and management of COVID per hospitalist. -Recommend Imodium as needed diarrhea. -Management of chronic conditions per hospitalist.  Mikey Bussing, DNP, AGPCNP-BC, AOCNP   ADDENDUM: I saw and examined Aaron Weaver  this morning.  I agree with Kristin's assessment.  His main problem clearly is the COVID.  This is nothing new with him having his chemotherapy.  I realize that he is neutropenic.  He did get G-CSF in the clinic.  I would like to believe that his white cell count will be trending upward.  I think his big problem is the diabetes.  His blood sugars are quite high.  Again, I do think his lymphoma or treatment for lymphoma really is an issue for right now although he is somewhat pancytopenic.  I would treat his COVID as other patients.  Again I suspect his white cell count should begin to increase over the next 2-3 days.  We will see if he has any positive blood cultures.  I would continue his antibiotics for right now.  He actually does look pretty good.  I just feel bad that he now has COVID.  I feel bad that he passed out.  Sounds like he may have had some dehydration.  I appreciate the great care that he will get from the wonderful staff up on [address].  I know that they will do a wonderful job with him.  Lattie Haw, MD   Michaelyn Barter 4:32

## 2021-05-30 NOTE — Progress Notes (Signed)
Patient's BP trending down, currently 91/59, HR 85. Pt asymptomatic. Holding AM dose atenolol. MD Nettey paged to be made aware. Will continue to monitor closely.

## 2021-05-30 NOTE — Assessment & Plan Note (Addendum)
Patient follows with Dr. Marin Olp and is on chemotherapy. First cycle started 1/13 and he received G-CSF on 1/6 -Continue Famvir for shingles prophylaxis

## 2021-05-30 NOTE — Assessment & Plan Note (Addendum)
Unknown etiology at this time. Patient has required multiple I/O catheterization. Foley catheter placed on 1/23. Urine culture (1/23) with no growth, but culture obtained after antibiotics initiated. -Continue foley catheter

## 2021-05-30 NOTE — Telephone Encounter (Signed)
Received a call from [facility] stating that pt was admitted to the hospital due to Covid and Sepsis so they are going to postpone his bone marrow procedure that was scheduled for today. Pt is in room 1443. Dr. Marin Olp notified.

## 2021-05-30 NOTE — Assessment & Plan Note (Signed)
History of repair.

## 2021-05-30 NOTE — Assessment & Plan Note (Addendum)
Patient with hypotension. Atenolol discontinued.

## 2021-05-30 NOTE — Progress Notes (Signed)
Pharmacy Antibiotic Note  Aaron Weaver. is a 66 y.o. male admitted on 05/29/2021 with sepsis.  Pt was recently diagnosed with lymphoma and started on chemotherapy - received chemo on 1/13, and Neulasta on 1/16. Broad spectrum antibiotics being initiated on admission for sepsis of unknown origin. Also found to be COVID+. Pharmacy has been consulted for vancomycin dosing.  Today, 05/30/21 -SCr elevated from baseline. CrCl ~60 mL/min -WBC/ANC low -Afebrile  Plan: Continue cefepime 2 g IV q8h Change vancomycin to 1500 mg IV q24h for estimated AUC of 470 Goal vancomycin AUC 400-550.  Monitor renal function, culture data for ability to de-escalate antibiotics.  Height: 6' (182.9 cm) Weight: 80.5 kg (177 lb 7.5 oz) IBW/kg (Calculated) : 77.6  Temp (24hrs), Avg:98.6 F (37 C), Min:97.7 F (36.5 C), Max:99.9 F (37.7 C)  Recent Labs  Lab 05/29/21 1538 05/29/21 1738 05/29/21 2320 05/30/21 0012 05/30/21 0450  WBC 0.3*  --   --   --  1.1*  CREATININE 1.24  --   --   --  1.29*  LATICACIDVEN 3.8* 2.8* 2.8* 2.7*  --     Estimated Creatinine Clearance: 61 mL/min (A) (by C-G formula based on SCr of 1.29 mg/dL (H)).    Allergies  Allergen Reactions   Fexofenadine Other (See Comments)    "dries me out too much"   Hydrochlorothiazide Other (See Comments)    REACTION: cramps   Penicillins Hives   Pravastatin Sodium Other (See Comments)    REACTION: aches   Requip [Ropinirole Hcl] Other (See Comments)    Bad dreams    Antimicrobials this admission: cefepime 1/22 >>  vancomycin 1/22 >>  Famciclovir home regimen continued  Dose adjustments this admission:  Microbiology results: 1/22 BCx: ngtd  Lenis Noon, PharmD 05/30/2021 8:28 AM

## 2021-05-30 NOTE — Assessment & Plan Note (Signed)
Patient is on Janumet and glimepiride as an outpatient -Continue SSI

## 2021-05-31 ENCOUNTER — Other Ambulatory Visit: Payer: Self-pay

## 2021-05-31 ENCOUNTER — Inpatient Hospital Stay (HOSPITAL_COMMUNITY): Payer: Medicare Other

## 2021-05-31 ENCOUNTER — Encounter (HOSPITAL_COMMUNITY): Payer: Self-pay | Admitting: Internal Medicine

## 2021-05-31 DIAGNOSIS — R7881 Bacteremia: Secondary | ICD-10-CM

## 2021-05-31 DIAGNOSIS — R338 Other retention of urine: Secondary | ICD-10-CM | POA: Diagnosis not present

## 2021-05-31 DIAGNOSIS — N179 Acute kidney failure, unspecified: Secondary | ICD-10-CM | POA: Diagnosis not present

## 2021-05-31 DIAGNOSIS — B9562 Methicillin resistant Staphylococcus aureus infection as the cause of diseases classified elsewhere: Secondary | ICD-10-CM

## 2021-05-31 DIAGNOSIS — M79621 Pain in right upper arm: Secondary | ICD-10-CM

## 2021-05-31 DIAGNOSIS — J449 Chronic obstructive pulmonary disease, unspecified: Secondary | ICD-10-CM | POA: Diagnosis not present

## 2021-05-31 DIAGNOSIS — R6510 Systemic inflammatory response syndrome (SIRS) of non-infectious origin without acute organ dysfunction: Secondary | ICD-10-CM | POA: Diagnosis not present

## 2021-05-31 DIAGNOSIS — I4891 Unspecified atrial fibrillation: Secondary | ICD-10-CM

## 2021-05-31 HISTORY — DX: Methicillin resistant Staphylococcus aureus infection as the cause of diseases classified elsewhere: B95.62

## 2021-05-31 HISTORY — DX: Bacteremia: R78.81

## 2021-05-31 LAB — GLUCOSE, CAPILLARY
Glucose-Capillary: 138 mg/dL — ABNORMAL HIGH (ref 70–99)
Glucose-Capillary: 163 mg/dL — ABNORMAL HIGH (ref 70–99)
Glucose-Capillary: 159 mg/dL — ABNORMAL HIGH (ref 70–99)
Glucose-Capillary: 220 mg/dL — ABNORMAL HIGH (ref 70–99)

## 2021-05-31 LAB — COMPREHENSIVE METABOLIC PANEL
ALT: 21 U/L (ref 0–44)
AST: 22 U/L (ref 15–41)
Albumin: 2.3 g/dL — ABNORMAL LOW (ref 3.5–5.0)
Alkaline Phosphatase: 39 U/L (ref 38–126)
Anion gap: 9 (ref 5–15)
BUN: 18 mg/dL (ref 8–23)
CO2: 23 mmol/L (ref 22–32)
Calcium: 7.2 mg/dL — ABNORMAL LOW (ref 8.9–10.3)
Chloride: 103 mmol/L (ref 98–111)
Creatinine, Ser: 0.76 mg/dL (ref 0.61–1.24)
GFR, Estimated: >60 mL/min (ref >60)
Glucose, Bld: 126 mg/dL — ABNORMAL HIGH (ref 70–99)
Potassium: 2.8 mmol/L — ABNORMAL LOW (ref 3.5–5.1)
Sodium: 135 mmol/L (ref 135–145)
Total Bilirubin: 0.9 mg/dL (ref 0.3–1.2)
Total Protein: 5.5 g/dL — ABNORMAL LOW (ref 6.5–8.1)

## 2021-05-31 LAB — URINE CULTURE: Culture: NO GROWTH

## 2021-05-31 LAB — CBC WITH DIFFERENTIAL/PLATELET
Abs Immature Granulocytes: 0.13 10*3/uL — ABNORMAL HIGH (ref 0.00–0.07)
Basophils Absolute: 0 10*3/uL (ref 0.0–0.1)
Basophils Relative: 2 %
Eosinophils Absolute: 0 10*3/uL (ref 0.0–0.5)
Eosinophils Relative: 1 %
HCT: 26.8 % — ABNORMAL LOW (ref 39.0–52.0)
Hemoglobin: 9.2 g/dL — ABNORMAL LOW (ref 13.0–17.0)
Immature Granulocytes: 8 %
Lymphocytes Relative: 8 %
Lymphs Abs: 0.1 10*3/uL — ABNORMAL LOW (ref 0.7–4.0)
MCH: 28.2 pg (ref 26.0–34.0)
MCHC: 34.3 g/dL (ref 30.0–36.0)
MCV: 82.2 fL (ref 80.0–100.0)
Monocytes Absolute: 0.1 10*3/uL (ref 0.1–1.0)
Monocytes Relative: 7 %
Neutro Abs: 1.1 10*3/uL — ABNORMAL LOW (ref 1.7–7.7)
Neutrophils Relative %: 74 %
Platelets: 43 10*3/uL — ABNORMAL LOW (ref 150–400)
RBC: 3.26 MIL/uL — ABNORMAL LOW (ref 4.22–5.81)
RDW: 13.3 % (ref 11.5–15.5)
WBC: 1.5 10*3/uL — ABNORMAL LOW (ref 4.0–10.5)
nRBC: 0 % (ref 0.0–0.2)

## 2021-05-31 LAB — MAGNESIUM: Magnesium: 1.6 mg/dL — ABNORMAL LOW (ref 1.7–2.4)

## 2021-05-31 LAB — ECHOCARDIOGRAM LIMITED
Height: 72 in
Weight: 2839.52 oz

## 2021-05-31 LAB — D-DIMER, QUANTITATIVE: D-Dimer, Quant: 1.41 ug/mL-FEU — ABNORMAL HIGH (ref 0.00–0.50)

## 2021-05-31 LAB — C-REACTIVE PROTEIN: CRP: 37.5 mg/dL — ABNORMAL HIGH (ref <1.0)

## 2021-05-31 MED ORDER — MAGNESIUM SULFATE 2 GM/50ML IV SOLN
2.0000 g | Freq: Once | INTRAVENOUS | Status: AC
  Administered 2021-05-31: 06:00:00 2 g via INTRAVENOUS
  Filled 2021-05-31: qty 50

## 2021-05-31 MED ORDER — ADULT MULTIVITAMIN W/MINERALS CH
1.0000 | ORAL_TABLET | Freq: Every day | ORAL | Status: DC
  Administered 2021-05-31 – 2021-06-08 (×9): 1 via ORAL
  Filled 2021-05-31 (×9): qty 1

## 2021-05-31 MED ORDER — AMIODARONE HCL IN DEXTROSE 360-4.14 MG/200ML-% IV SOLN
60.0000 mg/h | INTRAVENOUS | Status: AC
  Administered 2021-05-31 (×2): 60 mg/h via INTRAVENOUS
  Filled 2021-05-31: qty 200

## 2021-05-31 MED ORDER — NEPRO/CARBSTEADY PO LIQD
237.0000 mL | Freq: Two times a day (BID) | ORAL | Status: DC
  Administered 2021-05-31 – 2021-06-07 (×12): 237 mL via ORAL
  Filled 2021-05-31 (×18): qty 237

## 2021-05-31 MED ORDER — VANCOMYCIN HCL 1000 MG/200ML IV SOLN
1000.0000 mg | Freq: Two times a day (BID) | INTRAVENOUS | Status: DC
  Administered 2021-05-31 – 2021-06-04 (×10): 1000 mg via INTRAVENOUS
  Filled 2021-05-31 (×9): qty 200

## 2021-05-31 MED ORDER — POTASSIUM CHLORIDE CRYS ER 20 MEQ PO TBCR
40.0000 meq | EXTENDED_RELEASE_TABLET | Freq: Two times a day (BID) | ORAL | Status: AC
  Administered 2021-05-31 (×2): 40 meq via ORAL
  Filled 2021-05-31 (×2): qty 2

## 2021-05-31 MED ORDER — AMIODARONE HCL IN DEXTROSE 360-4.14 MG/200ML-% IV SOLN
30.0000 mg/h | INTRAVENOUS | Status: DC
  Administered 2021-05-31 – 2021-06-01 (×3): 30 mg/h via INTRAVENOUS
  Filled 2021-05-31 (×3): qty 200

## 2021-05-31 NOTE — Assessment & Plan Note (Signed)
Unknown source. Complicated by port-a-cath in place. ID consulted. Recent Transthoracic Echocardiogram from 1/12 -ID recommendations -Continue Vancomycin

## 2021-05-31 NOTE — Assessment & Plan Note (Addendum)
Patient with a somewhat remote history (05/28/20) excisional lymph node biopsy, still with tenderness and mild erythema around the site. Possible source for bacteremia -[place] of axilla to rule out abscess; if inconclusive, will obtain a CT

## 2021-05-31 NOTE — Progress Notes (Signed)
Initial Nutrition Assessment  DOCUMENTATION CODES:  Not applicable  INTERVENTION:  Liberalize diet to carb modified due to poor intake and increased needs. Encourage PO intake MVI with minerals daily Nepro Shake po BID, each supplement provides 425 kcal and 19 grams protein  NUTRITION DIAGNOSIS:  Increased nutrient needs related to cancer and cancer related treatments, acute illness (COVID19) as evidenced by estimated needs.  GOAL:  Patient will meet greater than or equal to 90% of their needs  MONITOR:  PO intake, Weight trends, Supplement acceptance, Labs  REASON FOR ASSESSMENT:  Malnutrition Screening Tool    ASSESSMENT:  66 year old male with history of COPD, GERD, lymphoma (active chemo, started January 2023), DM type 2, and HTN presented to ED after a fall/syncope at home.   Pt found to be septic with blood cultures positive for MRSA. Incidentally found to be positive for COVID19 infection.   Severe weight loss of 10.4% noted over the last month (12/28-1/22).   Attempted to call pt on room phone, no answer at this time. Due to severe weight loss and recorded poor intake, would favor a diet liberalization to allow for more dining choices. Will all add Nepro BID to provide additional nutrition with controlled carbohydrate amounts.  Average Meal Intake: 1/23: 5% intake x 2 recorded meals (0-10%)  Nutritionally Relevant Medications: Scheduled Meds:  famciclovir  250 mg Oral BID   insulin aspart  0-9 Units Subcutaneous TID WC   pantoprazole  40 mg Oral Daily   potassium chloride  40 mEq Oral BID   rosuvastatin  10 mg Oral Daily   Continuous Infusions:  amiodarone 30 mg/hr (05/31/21 0731)   lactated ringers 125 mL/hr at 05/31/21 8938   remdesivir 100 mg in NS 100 mL 100 mg (05/31/21 1007)   vancomycin 1,000 mg (05/31/21 1048)   Labs Reviewed: Potassium 2.8 Mg 1.6 Calcium 8.56 (corrected for low albumin)  CRP 37.5 SBG ranges from 126-304 mg/dL over the last 24  hours HgbA1c 9.1% 05/13/21  NUTRITION - FOCUSED PHYSICAL EXAM: Defer to in-person assessment  Diet Order:   Diet Order             Diet heart healthy/carb modified Room service appropriate? Yes; Fluid consistency: Thin  Diet effective now                  EDUCATION NEEDS:  Education needs have been addressed  Skin:  Skin Assessment: Reviewed RN Assessment (abrasions from fall)  Last BM:  1/23 - type 6  Height:  Ht Readings from Last 1 Encounters:  05/29/21 6' (1.829 m)   Weight:  Wt Readings from Last 1 Encounters:  05/29/21 80.5 kg   Ideal Body Weight:  80.9 kg  BMI:  Body mass index is 24.07 kg/m.  Estimated Nutritional Needs:  Kcal:  2200-2500 kcal/d Protein:  120-130 g/d Fluid:  2.2-2.5 L/d  Ranell Patrick, RD, LDN Clinical Dietitian RD pager # available in [facility]  After hours/weekend pager # available in [facility]

## 2021-05-31 NOTE — Progress Notes (Signed)
°  Echocardiogram 2D Echocardiogram has been performed.  Darlina Sicilian M 05/31/2021, 2:45 PM

## 2021-05-31 NOTE — Progress Notes (Signed)
° ° °  OVERNIGHT PROGRESS REPORT  Notified by RN for patient tachycardic with low BP and otherwise asymptomatic. LR bolus given with improvement in BP but modest to no effect on HR. 12-Lead yields a-fib RVR.   Amiodarone protocol started.  Patient is on Aspirin and Lovenox currently.   Gershon Cull MSNA MSN [facility] Acute Care Nurse Practitioner [place]

## 2021-05-31 NOTE — Progress Notes (Signed)
Patient converted back to NSR sometime around 0800. EKG done, VSS. MD Nettey made aware, Amiodarone continues with no complications. Will continue to monitor.

## 2021-05-31 NOTE — Consult Note (Signed)
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 17.0 g/dL   HCT 26.8 (L) 39.0 - 52.0 %   MCV 82.2 80.0 - 100.0 fL   MCH  28.2 26.0 - 34.0 pg   MCHC 34.3 30.0 - 36.0 g/dL   RDW 13.3 11.5 - 15.5 %   Platelets 43 (L) 150 - 400 K/uL    Comment: SPECIMEN CHECKED FOR CLOTS Immature Platelet Fraction may  be clinically indicated, consider ordering this additional test [id number] CONSISTENT WITH PREVIOUS RESULT REPEATED TO VERIFY    nRBC 0.0 0.0 - 0.2 %   Neutrophils Relative % 74 %   Neutro Abs 1.1 (L) 1.7 - 7.7 K/uL   Lymphocytes Relative 8 %   Lymphs Abs 0.1 (L) 0.7 - 4.0 K/uL   Monocytes Relative 7 %   Monocytes Absolute 0.1 0.1 - 1.0 K/uL   Eosinophils Relative 1 %   Eosinophils Absolute 0.0 0.0 - 0.5 K/uL   Basophils Relative 2 %   Basophils Absolute 0.0 0.0 - 0.1 K/uL   Immature Granulocytes 8 %   Abs Immature Granulocytes 0.13 (H) 0.00 - 0.07 K/uL    Comment: Performed at [facility], [place] [address] [place], [place] [address]  Comprehensive metabolic panel     Status: Abnormal   Collection Time: 05/31/21  4:11 AM  Result Value Ref Range   Sodium 135 135 - 145 mmol/L   Potassium 2.8 (L) 3.5 - 5.1 mmol/L    Comment: DELTA CHECK NOTED   Chloride 103 98 - 111 mmol/L   CO2 23 22 - 32 mmol/L   Glucose, Bld 126 (H) 70 - 99 mg/dL    Comment: Glucose reference range applies only to samples taken after fasting for at least 8 hours.   BUN 18 8 - 23 mg/dL   Creatinine, Ser 0.76 0.61 - 1.24 mg/dL   Calcium 7.2 (L) 8.9 - 10.3 mg/dL   Total Protein 5.5 (L) 6.5 - 8.1 g/dL   Albumin 2.3 (L) 3.5 - 5.0 g/dL   AST 22 15 - 41 U/L   ALT 21 0 - 44 U/L   Alkaline Phosphatase 39 38 - 126 U/L   Total Bilirubin 0.9 0.3 - 1.2 mg/dL   GFR, Estimated >60 >60 mL/min    Comment: (NOTE) Calculated using the CKD-EPI Creatinine Equation (2021)    Anion gap 9 5 - 15    Comment: Performed at [facility], [place] [address] [place], [place] [address]  C-reactive protein     Status: Abnormal   Collection Time: 05/31/21  4:11 AM  Result Value Ref Range   CRP 37.5 (H) <1.0 mg/dL    Comment: Performed at [place] [address], [place], [place] [address]  D-dimer, quantitative     Status: Abnormal   Collection Time: 05/31/21  4:11 AM  Result Value Ref  Range   D-Dimer, Quant 1.41 (H) 0.00 - 0.50 ug/mL-FEU    Comment: (NOTE) At the manufacturer cut-off value of 0.5 g/mL FEU, this assay has a negative predictive value of 95-100%.This assay is intended for use in conjunction with a clinical pretest probability (PTP) assessment model to exclude pulmonary embolism (PE) and deep venous thrombosis (DVT) in outpatients suspected of PE or DVT. Results should be correlated with clinical presentation. Performed at [facility], [place] [address], [place], [place] [address]   Magnesium     Status: Abnormal   Collection Time: 05/31/21  4:11 AM  Result Value Ref Range   Magnesium 1.6 (L) 1.7 - 2.4 mg/dL    Comment: Performed at [facility], [place] [address] [place], [place] [address]  Glucose,  capillary     Status: Abnormal   Collection Time: 05/31/21  7:31 AM  Result Value Ref Range   Glucose-Capillary 138 (H) 70 - 99 mg/dL    Comment: Glucose reference range applies only to samples taken after fasting for at least 8 hours.  Glucose, capillary     Status: Abnormal   Collection Time: 05/31/21 11:37 AM  Result Value Ref Range   Glucose-Capillary 163 (H) 70 - 99 mg/dL    Comment: Glucose reference range applies only to samples taken after fasting for at least 8 hours.   DG Chest 2 View  Result Date: 05/29/2021 CLINICAL DATA:  Suspected sepsis EXAM: CHEST - 2 VIEW COMPARISON:  05/13/2021 FINDINGS: Right-sided central venous port tip over the SVC. Partial clearing of previously noted patchy lower lung airspace opacities. Normal cardiomediastinal silhouette. No pneumothorax. IMPRESSION: Partial but incomplete clearing of previously noted patchy bilateral lower lung opacities suspicious for pneumonia. No new airspace disease is seen. Electronically Signed   By: Donavan Foil M.D.   On: 05/29/2021 16:15   CT Head Wo Contrast  Result Date: 05/29/2021 CLINICAL DATA:  Near syncope. Fall. Mental status change, unknown  cause EXAM: CT HEAD WITHOUT CONTRAST TECHNIQUE: Contiguous axial images were obtained from the base of the skull through the vertex without intravenous contrast. RADIATION DOSE REDUCTION: This exam was performed according to the departmental dose-optimization program which includes automated exposure control, adjustment of the mA and/or kV according to patient size and/or use of iterative reconstruction technique. COMPARISON:  03/28/2012 FINDINGS: Brain: No acute intracranial abnormality. Specifically, no hemorrhage, hydrocephalus, mass lesion, acute infarction, or significant intracranial injury. Vascular: No hyperdense vessel or unexpected calcification. Skull: No acute calvarial abnormality. Sinuses/Orbits: No acute findings Other: None IMPRESSION: No acute intracranial abnormality. Electronically Signed   By: Rolm Baptise M.D.   On: 05/29/2021 18:38   CT Angio Chest PE W/Cm &/Or Wo Cm  Result Date: 05/29/2021 CLINICAL DATA:  Near syncope.  Fall.  PE suspected. EXAM: CT ANGIOGRAPHY CHEST WITH CONTRAST TECHNIQUE: Multidetector CT imaging of the chest was performed using the standard protocol during bolus administration of intravenous contrast. Multiplanar CT image reconstructions and MIPs were obtained to evaluate the vascular anatomy. RADIATION DOSE REDUCTION: This exam was performed according to the departmental dose-optimization program which includes automated exposure control, adjustment of the mA and/or kV according to patient size and/or use of iterative reconstruction technique. CONTRAST:  56mL OMNIPAQUE IOHEXOL 350 MG/ML SOLN COMPARISON:  Standard CT chest 10/14/2019 FINDINGS: Cardiovascular: The heart size is normal. No substantial pericardial effusion. Coronary artery calcification is evident. Mild atherosclerotic calcification is noted in the wall of the thoracic aorta. There is no filling defect within the opacified pulmonary arteries to suggest the presence of an acute pulmonary embolus. Right  Port-A-Cath tip is positioned in the distal SVC. Mediastinum/Nodes: No mediastinal lymphadenopathy. There is no hilar lymphadenopathy. The esophagus has normal imaging features. There is no axillary lymphadenopathy. Lungs/Pleura: Interval development of peripheral architectural distortion and scarring with areas of peripheral ground-glass opacity since prior chest CT and also since PET-CT of 04/15/2021. No focal airspace consolidation. No pleural effusion. No pneumothorax. Paraseptal emphysema noted in the lung apices. Upper Abdomen: The liver shows diffusely decreased attenuation suggesting fat deposition. Tiny fat density lesions in both adrenal glands are consistent with tiny adenomas or myelolipomas, stable. Musculoskeletal: No worrisome lytic or sclerotic osseous abnormality. Old fracture nonunion posterior right tenth rib with healed posterior ninth rib fracture. Evidence of previous vertebral augmentation at L1,  incompletely visualized. Review of the MIP images confirms the above findings. IMPRESSION: 1. No CT evidence for acute pulmonary embolus. 2. Since prior PET-CT of 04/15/2021, the patient has developed peripheral patchy and nodular areas of architectural distortion and ground-glass opacity. Imaging features are suggestive of sequelae of prior atypical infection, including viral etiology. Acute infectious/inflammatory process is not excluded but considered less likely. 3. The liver shows diffusely decreased attenuation suggesting fat deposition. 4.  Emphysema (ICD10-J43.9) and Aortic Atherosclerosis (ICD10-170.0) Electronically Signed   By: Misty Stanley M.D.   On: 05/29/2021 18:47   CT ABDOMEN PELVIS W CONTRAST  Result Date: 05/30/2021 CLINICAL DATA:  Abdominal pain, lymphoma. EXAM: CT ABDOMEN AND PELVIS WITH CONTRAST TECHNIQUE: Multidetector CT imaging of the abdomen and pelvis was performed using the standard protocol following bolus administration of intravenous contrast. RADIATION DOSE  REDUCTION: This exam was performed according to the departmental dose-optimization program which includes automated exposure control, adjustment of the mA and/or kV according to patient size and/or use of iterative reconstruction technique. CONTRAST:  125mL OMNIPAQUE IOHEXOL 300 MG/ML  SOLN COMPARISON:  CT chest 05/29/2021 PET 04/15/2021 and CT angiography abdomen and pelvis 02/25/2021. FINDINGS: Lower chest: Peripheral predominant patchy areas of ground-glass, new from 04/15/2021. Heart size is normal. No pericardial or pleural effusion. Atherosclerotic calcification of the aorta, aortic valve and coronary arteries. Hepatobiliary: Liver is decreased in attenuation diffusely and is enlarged, measuring 20.0 cm. Subcentimeter low-attenuation lesion in the dome of the right hepatic lobe is too small to characterize. Liver and gallbladder are otherwise unremarkable. No biliary ductal dilatation. Pancreas: There are a few scattered calcifications in the pancreas. Spleen: Negative. Adrenals/Urinary Tract: Adrenal glands are unremarkable. 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 Right kidney is unremarkable. Soft tissue fullness is again seen in the left intrarenal collecting system and left renal pelvis. Ureters are decompressed. Bladder is grossly unremarkable. Stomach/Bowel: Stomach, small bowel, appendix and colon are unremarkable. Vascular/Lymphatic: Atherosclerotic calcification of the aorta with an endovascular stent graft in place. Infiltrative appearing soft tissue in the left common iliac station measures approximately 2.0 cm (2/69), similar. No additional pathologically enlarged lymph nodes. Reproductive: Prostate is visualized. Other: Small left inguinal hernia contains fat. Mesenteries and peritoneum are unremarkable. Musculoskeletal: Degenerative changes in the spine. Old bilateral rib fractures. T12 vertebral body augmentation. No worrisome lytic or sclerotic lesions.  IMPRESSION: 1. No acute findings to explain the patient's abdominal pain. 2. Left intrarenal collecting system soft tissue mass and left common iliac nodal mass, consistent with the provided history of lymphoma. Appearance is similar to 02/25/2021. 3. Bibasilar peripheral predominant ground-glass opacities in the lungs may be postinfectious/postinflammatory in etiology, including due to COVID-19. 4. Enlarged steatotic liver. 5. Chronic calcific pancreatitis. 6. Aortic atherosclerosis (ICD10-I70.0). Coronary artery calcification. Electronically Signed   By: Lorin Picket M.D.   On: 05/30/2021 13:52      Assessment/Plan MRSA bacteremia The patient's chart, labs, vitals, and recent progress notes and consult notes have been reviewed.  Unfortunately given his bacteremia, his PAC will need to be removed at the request of ID.  No overt signs of infection, but this can not be determined always by physical exam.  We will make him NPO p MN with plans for removal tomorrow.  We have discussed his pancytopenia and specifically his thrombocytopenia causing an increased risk of bleeding, but this is still less likely with just a PAC removal.  We also discussed the possibility of leaving his wound open and requiring pack  and dressing changes.  He understands all of this and agrees to proceed.  R axillary pain The patient R axilla does have some erythema present just lateral to his scar from his LN BX site.  Given the fullness from his lymphadenopathy it is difficult to tell if he has a seroma vs abscess there as well.  Agree with R axillary [place].  If there is some fluid present, we can likely drain this at the time of surgery tomorrow as well.  We discussed this as well and he is agreeable.   FEN - carb mod/NPO p MN VTE - none due to pancytopenia ID - vanc  COVID DM Large non-Hodgkin's lymphoma COPD GERD  High Medical Decision Making  Henreitta Cea, [facility] 05/31/2021, 2:11  PM Please see Amion for pager number during day hours 7:00am-4:30pm or 7:00am -11:30am on weekends

## 2021-05-31 NOTE — Progress Notes (Signed)
°  Amiodarone Drug - Drug Interaction Consult Note  Recommendations:  Amiodarone is metabolized by the cytochrome P450 system and therefore has the potential to cause many drug interactions. Amiodarone has an average plasma half-life of 50 days (range 20 to 100 days).   There is potential for drug interactions to occur several weeks or months after stopping treatment and the onset of drug interactions may be slow after initiating amiodarone.   [x]  Statins: Increased risk of myopathy. Simvastatin- restrict dose to 20mg  daily. Other statins: counsel patients to report any muscle pain or weakness immediately.  []  Anticoagulants: Amiodarone can increase anticoagulant effect. Consider warfarin dose reduction. Patients should be monitored closely and the dose of anticoagulant altered accordingly, remembering that amiodarone levels take several weeks to stabilize.  []  Antiepileptics: Amiodarone can increase plasma concentration of phenytoin, the dose should be reduced. Note that small changes in phenytoin dose can result in large changes in levels. Monitor patient and counsel on signs of toxicity.  []  Beta blockers: increased risk of bradycardia, AV block and myocardial depression. Sotalol - avoid concomitant use.  []   Calcium channel blockers (diltiazem and verapamil): increased risk of bradycardia, AV block and myocardial depression.  []   Cyclosporine: Amiodarone increases levels of cyclosporine. Reduced dose of cyclosporine is recommended.  []  Digoxin dose should be halved when amiodarone is started.  []  Diuretics: increased risk of cardiotoxicity if hypokalemia occurs.  []  Oral hypoglycemic agents (glyburide, glipizide, glimepiride): increased risk of hypoglycemia. Patient's glucose levels should be monitored closely when initiating amiodarone therapy.   []  Drugs that prolong the QT interval:  Torsades de pointes risk may be increased with concurrent use - avoid if possible.  Monitor QTc, also  keep magnesium/potassium WNL if concurrent therapy can't be avoided.  Antibiotics: e.g. fluoroquinolones, erythromycin.  Antiarrhythmics: e.g. quinidine, procainamide, disopyramide, sotalol.  Antipsychotics: e.g. phenothiazines, haloperidol.   Lithium, tricyclic antidepressants, and methadone. Thank You,  Dolly Rias RPh 05/31/2021, 12:48 AM

## 2021-05-31 NOTE — Progress Notes (Signed)
° ° °  OVERNIGHT PROGRESS REPORT   Notified by RN for critical lab values of Potassium and Magnesium. Replacements ordered.    Gershon Cull MSNA MSN ACNPC-AG Acute Care Nurse Practitioner [place]

## 2021-05-31 NOTE — Progress Notes (Signed)
Pharmacy Antibiotic Note  Aaron Weaver. is a 66 y.o. male admitted on 05/29/2021 and found to have MRSA bacteremia. Pharmacy has been consulted for vancomycin dosing.  Slight AKI on admission now resolved with SCr down to 0.76. Will adjust the Vancomycin dose accordingly.   Plan: - Adjust Vancomycin to 1g IV every 12 hours (eAUC 403, Vd 0.72, SCr 0.8) - Will continue to follow renal function, culture results, and LOT plans  Height: 6' (182.9 cm) Weight: 80.5 kg (177 lb 7.5 oz) IBW/kg (Calculated) : 77.6  Temp (24hrs), Avg:97.9 F (36.6 C), Min:97.5 F (36.4 C), Max:98.2 F (36.8 C)  Recent Labs  Lab 05/29/21 1538 05/29/21 1738 05/29/21 2320 05/30/21 0012 05/30/21 0450 05/31/21 0411  WBC 0.3*  --   --   --  1.1* 1.5*  CREATININE 1.24  --   --   --  1.29* 0.76  LATICACIDVEN 3.8* 2.8* 2.8* 2.7*  --   --      Estimated Creatinine Clearance: 98.3 mL/min (by C-G formula based on SCr of 0.76 mg/dL).    Allergies  Allergen Reactions   Fexofenadine Other (See Comments)    "dries me out too much"   Hydrochlorothiazide Other (See Comments)    REACTION: cramps   Penicillins Hives   Pravastatin Sodium Other (See Comments)    REACTION: aches   Requip [Ropinirole Hcl] Other (See Comments)    Bad dreams    Antimicrobials this admission: Cefepime 1/22 >> 1/23 Vancomycin 1/22 >>  Remdesivir 1/23 >>  Dose adjustments this admission:  Microbiology results: 1/22 BCx: 1/4 GPC in clusters (BCID MRSA)  Thank you for allowing pharmacy to be a part of this patients care.  Alycia Rossetti, PharmD, BCPS Infectious Diseases Clinical Pharmacist 05/31/2021 8:00 AM   **Pharmacist phone directory can now be found on [email or web address] ([email or web address]).  Listed under [place].

## 2021-05-31 NOTE — Assessment & Plan Note (Signed)
Likely transient and related to acute illness in addition to discontinuation of atenolol. Patient started on amiodarone drip with load. Patient converted back to sinus rhythm -Continue amiodarone until stable heart rate on atenolol -Resume atenolol once blood pressure adequately supported

## 2021-05-31 NOTE — Progress Notes (Signed)
PROGRESS NOTE    Aaron ACKERS Sr.  HWE:[id number] DOB: 1955/12/09 DOA: 05/29/2021 PCP: [name], MD   Brief Narrative: Aaron Fee Sr. Is a 66 y.o. male with a history of diabetes mellitus type 2, AAA s/p endovascular repair, COPD, lymphoma recently started chemotherapy. Patient presented secondary to fall and weakness and found to have evidence of SIRS with unknown infectious source. Empiric antibiotics started. Blood cultures significant for MRSA. Patient transitioned to Vancomycin monotherapy and ID consulted.   Assessment & Plan:   * Severe sepsis ([place])- (present on admission) MRSA bacteremia source. Complicated by neutropenia in setting of recent chemotherapy. Blood cultures obtained and results confirm MRSA, without source. CT abdomen/pelvis ordered unremarkable for abscess. Started empirically on Vancomycin and Cefepime and switched to Vancomycin monotherapy. ID auto-consulted. -See problem,   Atrial fibrillation with RVR ([place]) Likely transient and related to acute illness in addition to discontinuation of atenolol. Patient started on amiodarone drip with load. Patient converted back to sinus rhythm -Continue amiodarone until stable heart rate on atenolol -Resume atenolol once blood pressure adequately supported  Axillary tenderness, right Patient with a somewhat remote history (05/28/20) excisional lymph node biopsy, still with tenderness and mild erythema around the site. Possible source for bacteremia -[place] of axilla to rule out abscess; if inconclusive, will obtain a CT  Acute urinary retention Unknown etiology at this time. Patient has required multiple I/O catheterization. Foley catheter placed on 1/23. Urine culture (1/23) with no growth, but culture obtained after antibiotics initiated. -Continue foley catheter  Pancytopenia ([place]) Secondary to recent chemotherapy. ANC of 800. Received G-CSF on 1/6. Platelets trending down. -CBC daily  AKI (acute kidney  injury) ([place]) Baseline creatinine of about 0.8-0.9. Creatinine of 1.24 on admission, initially worsened to 1.29 and has now resolved.  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 Marin Olp and is on chemotherapy. First cycle started 1/13 and he received G-CSF on 1/6 -Continue Famvir for shingles prophylaxis  COPD (chronic obstructive pulmonary disease) ([place])- (present on admission) Stable. Patient is on Servent, albuterol as an outpatient -Continue Brovana (substitute for Servent) -Duoneb prn  AAA (abdominal aortic aneurysm) without rupture- (present on admission) History of repair.  Essential hypertension- (present on admission) Patient with hypotension. Atenolol discontinued.  DM2 (diabetes mellitus, type 2) ([place]) Patient is on Janumet and glimepiride as an outpatient -Continue SSI  MRSA bacteremia Unknown source. Complicated by port-a-cath in place. ID consulted. Recent Transthoracic Echocardiogram from 1/12 -ID recommendations -Continue Vancomycin  Diarrhea Possibly related to chemotherapy. C. difficile negative. -Supportive care  COVID-19 virus infection Incidental finding. Remdesivir started on 1/23. No steroids secondary to lack of hypoxia. CRP significantly elevated in setting of bacteremia. -Continue Remdesivir IV -Daily CMP, CBC, CRP  HYPERLIPIDEMIA -Continue Crestor     DVT prophylaxis: Lovenox Code Status:   Code Status: Full Code Family Communication: Son at bedside Disposition Plan: Discharge home likely in 3-5 days pending ID recommendations, outpatient antibiotic regimen, hemodynamic stability   Consultants:  Medical oncology Infectious disease  Procedures:  None  Antimicrobials: Vancomycin Cefepime  Remdesivir Famciclovir   Subjective: Atrial fibrillation with RVR overnight. No prior history. Converted back to sinus rhythm after amiodarone. No other concerns per  patient. Nursing noted some erythema of right axilla.  Objective: Vitals:   05/31/21 0500 05/31/21 0605 05/31/21 0800 05/31/21 0902  BP: 95/76 103/78 112/72   Pulse: (!) 143 (!) 56 95   Resp: 14 (!) 25 (!)  22   Temp:   97.9 F (36.6 C)   TempSrc:   Oral   SpO2: 97% 99% 94% 96%  Weight:      Height:        Intake/Output Summary (Last 24 hours) at 05/31/2021 1310 Last data filed at 05/31/2021 2119 Gross per 24 hour  Intake 3843.34 ml  Output 1500 ml  Net 2343.34 ml    Filed Weights   05/29/21 2201  Weight: 80.5 kg    Examination:  General exam: Appears calm and comfortable Respiratory system: Clear to auscultation. Respiratory effort normal. Cardiovascular system: S1 & S2 heard, RRR. No murmurs, rubs, gallops or clicks. Gastrointestinal system: Abdomen is nondistended, soft and nontender. No organomegaly or masses felt. Normal bowel sounds heard. Central nervous system: Alert and oriented. No focal neurological deficits. Musculoskeletal: No calf tenderness Skin: No cyanosis. Right axilla with incision scar and some mild erythema in the area; there is mild tenderness and induration but no obvious fluctuance Psychiatry: Judgement and insight appear normal. Mood & affect appropriate.     Data Reviewed: I have personally reviewed following labs and imaging studies  CBC Lab Results  Component Value Date   WBC 1.5 (L) 05/31/2021   RBC 3.26 (L) 05/31/2021   HGB 9.2 (L) 05/31/2021   HCT 26.8 (L) 05/31/2021   MCV 82.2 05/31/2021   MCH 28.2 05/31/2021   PLT 43 (L) 05/31/2021   MCHC 34.3 05/31/2021   RDW 13.3 05/31/2021   LYMPHSABS 0.1 (L) 05/31/2021   MONOABS 0.1 05/31/2021   EOSABS 0.0 05/31/2021   BASOSABS 0.0 41/74/0814     Last metabolic panel Lab Results  Component Value Date   NA 135 05/31/2021   K 2.8 (L) 05/31/2021   CL 103 05/31/2021   CO2 23 05/31/2021   BUN 18 05/31/2021   CREATININE 0.76 05/31/2021   GLUCOSE 126 (H) 05/31/2021   GFRNONAA >60  05/31/2021   GFRAA >60 09/08/2019   CALCIUM 7.2 (L) 05/31/2021   PROT 5.5 (L) 05/31/2021   ALBUMIN 2.3 (L) 05/31/2021   BILITOT 0.9 05/31/2021   ALKPHOS 39 05/31/2021   AST 22 05/31/2021   ALT 21 05/31/2021   ANIONGAP 9 05/31/2021    CBG (last 3)  Recent Labs    05/30/21 2155 05/31/21 0731 05/31/21 1137  GLUCAP 151* 138* 163*      GFR: Estimated Creatinine Clearance: 98.3 mL/min (by C-G formula based on SCr of 0.76 mg/dL).  Coagulation Profile: Recent Labs  Lab 05/29/21 1538  INR 1.2     Recent Results (from the past 240 hour(s))  Culture, blood (Routine x 2)     Status: Abnormal (Preliminary result)   Collection Time: 05/29/21  3:38 PM   Specimen: BLOOD  Result Value Ref Range Status   Specimen Description   Final    BLOOD RIGHT ANTECUBITAL Performed at [facility] [address] [place], [place] [address]    Special Requests   Final    BOTTLES DRAWN AEROBIC AND ANAEROBIC Blood Culture adequate volume Performed at [facility] [address], [place], [place] [address]    Culture  Setup Time   Final    GRAM POSITIVE COCCI IN CLUSTERS AEROBIC BOTTLE ONLY CRITICAL RESULT CALLED TO, READ BACK BY AND VERIFIED WITH: PHARMD J GADHIA [id number] AT 1559 BY CM    Culture (A)  Final    STAPHYLOCOCCUS AUREUS SUSCEPTIBILITIES TO FOLLOW Performed at [facility], [place] [address], [place], [place]  [address]    Report Status PENDING  Incomplete  Blood Culture ID Panel (Reflexed)     Status: Abnormal   Collection Time: 05/29/21  3:38 PM  Result Value Ref Range Status   Enterococcus faecalis NOT DETECTED NOT DETECTED Final   Enterococcus Faecium NOT DETECTED NOT DETECTED Final   Listeria monocytogenes NOT DETECTED NOT DETECTED Final   Staphylococcus species DETECTED (A) NOT DETECTED Final    Comment: CRITICAL RESULT CALLED TO, READ BACK BY AND VERIFIED WITH: PHARMD J GADHIA [id number] AT 1300 BY CM    Staphylococcus aureus  (BCID) DETECTED (A) NOT DETECTED Final    Comment: Methicillin (oxacillin)-resistant Staphylococcus aureus (MRSA). MRSA is predictably resistant to beta-lactam antibiotics (except ceftaroline). Preferred therapy is vancomycin unless clinically contraindicated. Patient requires contact precautions if  hospitalized. CRITICAL RESULT CALLED TO, READ BACK BY AND VERIFIED WITH: PHARMD J GADHIA [id number] AT 1300 BY CM    Staphylococcus epidermidis NOT DETECTED NOT DETECTED Final   Staphylococcus lugdunensis NOT DETECTED NOT DETECTED Final   Streptococcus species NOT DETECTED NOT DETECTED Final   Streptococcus agalactiae NOT DETECTED NOT DETECTED Final   Streptococcus pneumoniae NOT DETECTED NOT DETECTED Final   Streptococcus pyogenes NOT DETECTED NOT DETECTED Final   A.calcoaceticus-baumannii NOT DETECTED NOT DETECTED Final   Bacteroides fragilis NOT DETECTED NOT DETECTED Final   Enterobacterales NOT DETECTED NOT DETECTED Final   Enterobacter cloacae complex NOT DETECTED NOT DETECTED Final   Escherichia coli NOT DETECTED NOT DETECTED Final   Klebsiella aerogenes NOT DETECTED NOT DETECTED Final   Klebsiella oxytoca NOT DETECTED NOT DETECTED Final   Klebsiella pneumoniae NOT DETECTED NOT DETECTED Final   Proteus species NOT DETECTED NOT DETECTED Final   Salmonella species NOT DETECTED NOT DETECTED Final   Serratia marcescens NOT DETECTED NOT DETECTED Final   Haemophilus influenzae NOT DETECTED NOT DETECTED Final   Neisseria meningitidis NOT DETECTED NOT DETECTED Final   Pseudomonas aeruginosa NOT DETECTED NOT DETECTED Final   Stenotrophomonas maltophilia NOT DETECTED NOT DETECTED Final   Candida albicans NOT DETECTED NOT DETECTED Final   Candida auris NOT DETECTED NOT DETECTED Final   Candida glabrata NOT DETECTED NOT DETECTED Final   Candida krusei NOT DETECTED NOT DETECTED Final   Candida parapsilosis NOT DETECTED NOT DETECTED Final   Candida tropicalis NOT DETECTED NOT DETECTED Final    Cryptococcus neoformans/gattii NOT DETECTED NOT DETECTED Final   Meth resistant mecA/C and MREJ DETECTED (A) NOT DETECTED Final    Comment: CRITICAL RESULT CALLED TO, READ BACK BY AND VERIFIED WITH: Damian Leavell [id number] AT 1300 BY CM Performed at [facility], [address] [place], [place] [address]   Culture, blood (Routine x 2)     Status: None (Preliminary result)   Collection Time: 05/29/21  3:43 PM   Specimen: BLOOD  Result Value Ref Range Status   Specimen Description   Final    BLOOD LEFT HAND Performed at [facility] [address] [place], [place] [address]    Special Requests   Final    BOTTLES DRAWN AEROBIC AND ANAEROBIC Blood Culture adequate volume Performed at [facility] [address], [place], [place] [address]    Culture   Final    NO GROWTH 1 DAY Performed at [facility], [place] [address], [place], [place] [address]    Report Status PENDING  Incomplete  Resp Panel by RT-PCR (Flu A&B, Covid)     Status: Abnormal   Collection Time:  05/29/21  7:19 PM   Specimen: Nasopharyngeal(NP) swabs in vial transport medium  Result Value Ref Range Status   SARS Coronavirus 2 by RT PCR POSITIVE (A) NEGATIVE Final    Comment: (NOTE) SARS-CoV-2 target nucleic acids are DETECTED.  The SARS-CoV-2 RNA is generally detectable in upper respiratory specimens during the acute phase of infection. Positive results are indicative of the presence of the identified virus, but do not rule out bacterial infection or co-infection with other pathogens not detected by the test. Clinical correlation with patient history and other diagnostic information is necessary to determine patient infection status. The expected result is Negative.  Fact Sheet for Patients: EntrepreneurPulse.com.au  Fact Sheet for Healthcare Providers: IncredibleEmployment.be  This test is not yet approved or cleared by the  [place] FDA and  has been authorized for detection and/or diagnosis of SARS-CoV-2 by FDA under an Emergency Use Authorization (EUA).  This EUA will remain in effect (meaning this test can be used) for the duration of  the COVID-19 declaration under Section 564(b)(1) of the A ct, 21 U.S.C. section 360bbb-3(b)(1), unless the authorization is terminated or revoked sooner.     Influenza A by PCR NEGATIVE NEGATIVE Final   Influenza B by PCR NEGATIVE NEGATIVE Final    Comment: (NOTE) The Xpert Xpress SARS-CoV-2/FLU/RSV plus assay is intended as an aid in the diagnosis of influenza from Nasopharyngeal swab specimens and should not be used as a sole basis for treatment. Nasal washings and aspirates are unacceptable for Xpert Xpress SARS-CoV-2/FLU/RSV testing.  Fact Sheet for Patients: EntrepreneurPulse.com.au  Fact Sheet for Healthcare Providers: IncredibleEmployment.be  This test is not yet approved or cleared by the [place] FDA and has been authorized for detection and/or diagnosis of SARS-CoV-2 by FDA under an Emergency Use Authorization (EUA). This EUA will remain in effect (meaning this test can be used) for the duration of the COVID-19 declaration under Section 564(b)(1) of the Act, 21 U.S.C. section 360bbb-3(b)(1), unless the authorization is terminated or revoked.  Performed at [facility], [place] [address], [place], [place] [address]   C Difficile Quick Screen w PCR reflex     Status: None   Collection Time: 05/30/21  8:42 AM   Specimen: STOOL  Result Value Ref Range Status   C Diff antigen NEGATIVE NEGATIVE Final   C Diff toxin NEGATIVE NEGATIVE Final   C Diff interpretation No C. difficile detected.  Final    Comment: Performed at [facility], [place] [address] [place], [place] [address]  Urine Culture     Status: None   Collection Time: 05/30/21  1:44 PM   Specimen: Urine, Catheterized   Result Value Ref Range Status   Specimen Description   Final    URINE, CATHETERIZED Performed at [facility] [address] [place], [place] [address]    Special Requests   Final    NONE Performed at [facility], [place] [address], [place], [place] [address]    Culture   Final    NO GROWTH Performed at [facility], [place] [address], [place],  [address]    Report Status 05/31/2021 FINAL  Final         Radiology Studies: DG Chest 2 View  Result Date: 05/29/2021 CLINICAL DATA:  Suspected sepsis EXAM: CHEST - 2 VIEW COMPARISON:  05/13/2021 FINDINGS: Right-sided central venous port tip over the SVC. Partial clearing of previously noted patchy lower lung airspace opacities. Normal cardiomediastinal silhouette.  No pneumothorax. IMPRESSION: Partial but incomplete clearing of previously noted patchy bilateral lower lung opacities suspicious for pneumonia. No new airspace disease is seen. Electronically Signed   By: Donavan Foil M.D.   On: 05/29/2021 16:15   CT Head Wo Contrast  Result Date: 05/29/2021 CLINICAL DATA:  Near syncope. Fall. Mental status change, unknown cause EXAM: CT HEAD WITHOUT CONTRAST TECHNIQUE: Contiguous axial images were obtained from the base of the skull through the vertex without intravenous contrast. RADIATION DOSE REDUCTION: This exam was performed according to the departmental dose-optimization program which includes automated exposure control, adjustment of the mA and/or kV according to patient size and/or use of iterative reconstruction technique. COMPARISON:  03/28/2012 FINDINGS: Brain: No acute intracranial abnormality. Specifically, no hemorrhage, hydrocephalus, mass lesion, acute infarction, or significant intracranial injury. Vascular: No hyperdense vessel or unexpected calcification. Skull: No acute calvarial abnormality. Sinuses/Orbits: No acute findings Other: None IMPRESSION: No acute intracranial  abnormality. Electronically Signed   By: Rolm Baptise M.D.   On: 05/29/2021 18:38   CT Angio Chest PE W/Cm &/Or Wo Cm  Result Date: 05/29/2021 CLINICAL DATA:  Near syncope.  Fall.  PE suspected. EXAM: CT ANGIOGRAPHY CHEST WITH CONTRAST TECHNIQUE: Multidetector CT imaging of the chest was performed using the standard protocol during bolus administration of intravenous contrast. Multiplanar CT image reconstructions and MIPs were obtained to evaluate the vascular anatomy. RADIATION DOSE REDUCTION: This exam was performed according to the departmental dose-optimization program which includes automated exposure control, adjustment of the mA and/or kV according to patient size and/or use of iterative reconstruction technique. CONTRAST:  19mL OMNIPAQUE IOHEXOL 350 MG/ML SOLN COMPARISON:  Standard CT chest 10/14/2019 FINDINGS: Cardiovascular: The heart size is normal. No substantial pericardial effusion. Coronary artery calcification is evident. Mild atherosclerotic calcification is noted in the wall of the thoracic aorta. There is no filling defect within the opacified pulmonary arteries to suggest the presence of an acute pulmonary embolus. Right Port-A-Cath tip is positioned in the distal SVC. Mediastinum/Nodes: No mediastinal lymphadenopathy. There is no hilar lymphadenopathy. The esophagus has normal imaging features. There is no axillary lymphadenopathy. Lungs/Pleura: Interval development of peripheral architectural distortion and scarring with areas of peripheral ground-glass opacity since prior chest CT and also since PET-CT of 04/15/2021. No focal airspace consolidation. No pleural effusion. No pneumothorax. Paraseptal emphysema noted in the lung apices. Upper Abdomen: The liver shows diffusely decreased attenuation suggesting fat deposition. Tiny fat density lesions in both adrenal glands are consistent with tiny adenomas or myelolipomas, stable. Musculoskeletal: No worrisome lytic or sclerotic osseous  abnormality. Old fracture nonunion posterior right tenth rib with healed posterior ninth rib fracture. Evidence of previous vertebral augmentation at L1, incompletely visualized. Review of the MIP images confirms the above findings. IMPRESSION: 1. No CT evidence for acute pulmonary embolus. 2. Since prior PET-CT of 04/15/2021, the patient has developed peripheral patchy and nodular areas of architectural distortion and ground-glass opacity. Imaging features are suggestive of sequelae of prior atypical infection, including viral etiology. Acute infectious/inflammatory process is not excluded but considered less likely. 3. The liver shows diffusely decreased attenuation suggesting fat deposition. 4.  Emphysema (ICD10-J43.9) and Aortic Atherosclerosis (ICD10-170.0) Electronically Signed   By: Misty Stanley M.D.   On: 05/29/2021 18:47   CT ABDOMEN PELVIS W CONTRAST  Result Date: 05/30/2021 CLINICAL DATA:  Abdominal pain, lymphoma. EXAM: CT ABDOMEN AND PELVIS WITH CONTRAST TECHNIQUE: Multidetector CT imaging of the abdomen and pelvis was performed using the standard protocol following bolus administration of intravenous contrast. RADIATION  DOSE REDUCTION: This exam was performed according to the departmental dose-optimization program which includes automated exposure control, adjustment of the mA and/or kV according to patient size and/or use of iterative reconstruction technique. CONTRAST:  11mL OMNIPAQUE IOHEXOL 300 MG/ML  SOLN COMPARISON:  CT chest 05/29/2021 PET 04/15/2021 and CT angiography abdomen and pelvis 02/25/2021. FINDINGS: Lower chest: Peripheral predominant patchy areas of ground-glass, new from 04/15/2021. Heart size is normal. No pericardial or pleural effusion. Atherosclerotic calcification of the aorta, aortic valve and coronary arteries. Hepatobiliary: Liver is decreased in attenuation diffusely and is enlarged, measuring 20.0 cm. Subcentimeter low-attenuation lesion in the dome of the right  hepatic lobe is too small to characterize. Liver and gallbladder are otherwise unremarkable. No biliary ductal dilatation. Pancreas: There are a few scattered calcifications in the pancreas. Spleen: Negative. Adrenals/Urinary Tract: Adrenal glands are unremarkable. 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 Right kidney is unremarkable. Soft tissue fullness is again seen in the left intrarenal collecting system and left renal pelvis. Ureters are decompressed. Bladder is grossly unremarkable. Stomach/Bowel: Stomach, small bowel, appendix and colon are unremarkable. Vascular/Lymphatic: Atherosclerotic calcification of the aorta with an endovascular stent graft in place. Infiltrative appearing soft tissue in the left common iliac station measures approximately 2.0 cm (2/69), similar. No additional pathologically enlarged lymph nodes. Reproductive: Prostate is visualized. Other: Small left inguinal hernia contains fat. Mesenteries and peritoneum are unremarkable. Musculoskeletal: Degenerative changes in the spine. Old bilateral rib fractures. T12 vertebral body augmentation. No worrisome lytic or sclerotic lesions. IMPRESSION: 1. No acute findings to explain the patient's abdominal pain. 2. Left intrarenal collecting system soft tissue mass and left common iliac nodal mass, consistent with the provided history of lymphoma. Appearance is similar to 02/25/2021. 3. Bibasilar peripheral predominant ground-glass opacities in the lungs may be postinfectious/postinflammatory in etiology, including due to COVID-19. 4. Enlarged steatotic liver. 5. Chronic calcific pancreatitis. 6. Aortic atherosclerosis (ICD10-I70.0). Coronary artery calcification. Electronically Signed   By: Lorin Picket M.D.   On: 05/30/2021 13:52        Scheduled Meds:  [START ON 06/07/2021] allopurinol  100 mg Oral Daily   arformoterol  15 mcg Nebulization BID   aspirin EC  81 mg Oral Q0600   Chlorhexidine  Gluconate Cloth  6 each Topical Daily   DULoxetine  60 mg Oral QHS   enoxaparin (LOVENOX) injection  40 mg Subcutaneous Q24H   famciclovir  250 mg Oral BID   insulin aspart  0-9 Units Subcutaneous TID WC   pantoprazole  40 mg Oral Daily   potassium chloride  40 mEq Oral BID   rosuvastatin  10 mg Oral Daily   sodium chloride flush  10-40 mL Intracatheter Q12H   tamsulosin  0.4 mg Oral QPM   Continuous Infusions:  amiodarone 30 mg/hr (05/31/21 0731)   lactated ringers 125 mL/hr at 05/31/21 6269   remdesivir 100 mg in NS 100 mL 100 mg (05/31/21 1007)   vancomycin 1,000 mg (05/31/21 1048)     LOS: 1 day     Cordelia Poche, MD [facility] 05/31/2021, 1:10 PM  If 7PM-7AM, please contact night-coverage [email or web address]

## 2021-06-01 ENCOUNTER — Inpatient Hospital Stay (HOSPITAL_COMMUNITY): Payer: Medicare Other | Admitting: Anesthesiology

## 2021-06-01 ENCOUNTER — Encounter (HOSPITAL_COMMUNITY)
Admission: EM | Disposition: A | Discharge status: Home Health | Payer: Self-pay | Source: Home / Self Care | Attending: Internal Medicine

## 2021-06-01 ENCOUNTER — Encounter (HOSPITAL_COMMUNITY): Payer: Self-pay | Admitting: Internal Medicine

## 2021-06-01 DIAGNOSIS — A419 Sepsis, unspecified organism: Secondary | ICD-10-CM | POA: Diagnosis not present

## 2021-06-01 DIAGNOSIS — L0291 Cutaneous abscess, unspecified: Secondary | ICD-10-CM | POA: Diagnosis not present

## 2021-06-01 DIAGNOSIS — R338 Other retention of urine: Secondary | ICD-10-CM | POA: Diagnosis not present

## 2021-06-01 DIAGNOSIS — R6520 Severe sepsis without septic shock: Secondary | ICD-10-CM

## 2021-06-01 HISTORY — PX: PORT-A-CATH REMOVAL: SHX5289

## 2021-06-01 LAB — C-REACTIVE PROTEIN: CRP: 24.1 mg/dL — ABNORMAL HIGH (ref <1.0)

## 2021-06-01 LAB — COMPREHENSIVE METABOLIC PANEL
ALT: 126 U/L — ABNORMAL HIGH (ref 0–44)
AST: 167 U/L — ABNORMAL HIGH (ref 15–41)
Albumin: 2.2 g/dL — ABNORMAL LOW (ref 3.5–5.0)
Alkaline Phosphatase: 58 U/L (ref 38–126)
Anion gap: 7 (ref 5–15)
BUN: 14 mg/dL (ref 8–23)
CO2: 22 mmol/L (ref 22–32)
Calcium: 7.1 mg/dL — ABNORMAL LOW (ref 8.9–10.3)
Chloride: 103 mmol/L (ref 98–111)
Creatinine, Ser: 0.75 mg/dL (ref 0.61–1.24)
GFR, Estimated: >60 mL/min (ref >60)
Glucose, Bld: 173 mg/dL — ABNORMAL HIGH (ref 70–99)
Potassium: 3.3 mmol/L — ABNORMAL LOW (ref 3.5–5.1)
Sodium: 132 mmol/L — ABNORMAL LOW (ref 135–145)
Total Bilirubin: 0.7 mg/dL (ref 0.3–1.2)
Total Protein: 5.1 g/dL — ABNORMAL LOW (ref 6.5–8.1)

## 2021-06-01 LAB — CBC WITH DIFFERENTIAL/PLATELET
Abs Immature Granulocytes: 0.13 10*3/uL — ABNORMAL HIGH (ref 0.00–0.07)
Basophils Absolute: 0 10*3/uL (ref 0.0–0.1)
Basophils Relative: 1 %
Eosinophils Absolute: 0 10*3/uL (ref 0.0–0.5)
Eosinophils Relative: 0 %
HCT: 25.2 % — ABNORMAL LOW (ref 39.0–52.0)
Hemoglobin: 8.8 g/dL — ABNORMAL LOW (ref 13.0–17.0)
Immature Granulocytes: 5 %
Lymphocytes Relative: 6 %
Lymphs Abs: 0.2 10*3/uL — ABNORMAL LOW (ref 0.7–4.0)
MCH: 28.5 pg (ref 26.0–34.0)
MCHC: 34.9 g/dL (ref 30.0–36.0)
MCV: 81.6 fL (ref 80.0–100.0)
Monocytes Absolute: 0.2 10*3/uL (ref 0.1–1.0)
Monocytes Relative: 6 %
Neutro Abs: 2.4 10*3/uL (ref 1.7–7.7)
Neutrophils Relative %: 82 %
Platelets: 62 10*3/uL — ABNORMAL LOW (ref 150–400)
RBC: 3.09 MIL/uL — ABNORMAL LOW (ref 4.22–5.81)
RDW: 13.4 % (ref 11.5–15.5)
WBC: 2.9 10*3/uL — ABNORMAL LOW (ref 4.0–10.5)
nRBC: 0 % (ref 0.0–0.2)

## 2021-06-01 LAB — GLUCOSE, CAPILLARY
Glucose-Capillary: 159 mg/dL — ABNORMAL HIGH (ref 70–99)
Glucose-Capillary: 189 mg/dL — ABNORMAL HIGH (ref 70–99)
Glucose-Capillary: 206 mg/dL — ABNORMAL HIGH (ref 70–99)
Glucose-Capillary: 172 mg/dL — ABNORMAL HIGH (ref 70–99)
Glucose-Capillary: 213 mg/dL — ABNORMAL HIGH (ref 70–99)
Glucose-Capillary: 315 mg/dL — ABNORMAL HIGH (ref 70–99)

## 2021-06-01 LAB — CULTURE, BLOOD (ROUTINE X 2): Special Requests: ADEQUATE

## 2021-06-01 LAB — D-DIMER, QUANTITATIVE: D-Dimer, Quant: 2.14 ug/mL-FEU — ABNORMAL HIGH (ref 0.00–0.50)

## 2021-06-01 SURGERY — REMOVAL PORT-A-CATH
Anesthesia: General

## 2021-06-01 MED ORDER — LIDOCAINE HCL (PF) 2 % IJ SOLN
INTRAMUSCULAR | Status: AC
  Filled 2021-06-01: qty 5

## 2021-06-01 MED ORDER — ACETAMINOPHEN 650 MG RE SUPP
650.0000 mg | Freq: Four times a day (QID) | RECTAL | Status: DC | PRN
  Filled 2021-06-01: qty 1

## 2021-06-01 MED ORDER — MIDAZOLAM HCL 2 MG/2ML IJ SOLN
INTRAMUSCULAR | Status: AC
  Filled 2021-06-01: qty 2

## 2021-06-01 MED ORDER — FENTANYL CITRATE (PF) 100 MCG/2ML IJ SOLN
INTRAMUSCULAR | Status: DC | PRN
  Administered 2021-06-01: 100 ug via INTRAVENOUS

## 2021-06-01 MED ORDER — ACETAMINOPHEN 325 MG PO TABS
650.0000 mg | ORAL_TABLET | Freq: Four times a day (QID) | ORAL | Status: DC | PRN
  Administered 2021-06-02: 650 mg via ORAL
  Filled 2021-06-01: qty 2

## 2021-06-01 MED ORDER — OXYCODONE HCL 5 MG PO TABS: 5.0000 mg | ORAL_TABLET | Freq: Once | ORAL | Status: DC | PRN

## 2021-06-01 MED ORDER — MIDAZOLAM HCL 5 MG/5ML IJ SOLN
INTRAMUSCULAR | Status: DC | PRN
  Administered 2021-06-01: 2 mg via INTRAVENOUS

## 2021-06-01 MED ORDER — PHENYLEPHRINE 40 MCG/ML (10ML) SYRINGE FOR IV PUSH (FOR BLOOD PRESSURE SUPPORT)
PREFILLED_SYRINGE | INTRAVENOUS | Status: DC | PRN
  Administered 2021-06-01 (×2): 120 ug via INTRAVENOUS

## 2021-06-01 MED ORDER — PROPOFOL 10 MG/ML IV BOLUS
INTRAVENOUS | Status: DC | PRN
  Administered 2021-06-01: 150 mg via INTRAVENOUS

## 2021-06-01 MED ORDER — TRAMADOL HCL 50 MG PO TABS
50.0000 mg | ORAL_TABLET | Freq: Four times a day (QID) | ORAL | Status: DC | PRN
  Administered 2021-06-05 – 2021-06-07 (×3): 50 mg via ORAL
  Filled 2021-06-01 (×3): qty 1

## 2021-06-01 MED ORDER — OXYCODONE HCL 5 MG PO TABS: 5.0000 mg | ORAL_TABLET | ORAL | Status: DC | PRN

## 2021-06-01 MED ORDER — CHLORHEXIDINE GLUCONATE 0.12 % MT SOLN
15.0000 mL | Freq: Once | OROMUCOSAL | Status: AC
  Administered 2021-06-01: 13:00:00 15 mL via OROMUCOSAL

## 2021-06-01 MED ORDER — ONDANSETRON HCL 4 MG/2ML IJ SOLN
INTRAMUSCULAR | Status: AC
  Filled 2021-06-01: qty 2

## 2021-06-01 MED ORDER — OXYCODONE HCL 5 MG/5ML PO SOLN
5.0000 mg | Freq: Once | ORAL | Status: DC | PRN
Start: 2021-06-01 — End: 2021-06-01

## 2021-06-01 MED ORDER — ONDANSETRON HCL 4 MG/2ML IJ SOLN: 4.0000 mg | Freq: Four times a day (QID) | INTRAMUSCULAR | Status: DC | PRN

## 2021-06-01 MED ORDER — ONDANSETRON 4 MG PO TBDP
4.0000 mg | ORAL_TABLET | Freq: Four times a day (QID) | ORAL | Status: DC | PRN
Start: 2021-06-01 — End: 2021-06-08

## 2021-06-01 MED ORDER — POTASSIUM CHLORIDE CRYS ER 20 MEQ PO TBCR
40.0000 meq | EXTENDED_RELEASE_TABLET | Freq: Two times a day (BID) | ORAL | Status: AC
  Administered 2021-06-01: 21:00:00 40 meq via ORAL
  Filled 2021-06-01 (×2): qty 2

## 2021-06-01 MED ORDER — PHENYLEPHRINE HCL-NACL 20-0.9 MG/250ML-% IV SOLN
INTRAVENOUS | Status: DC | PRN
Start: 2021-06-01 — End: 2021-06-01
  Administered 2021-06-01: 40 ug/min via INTRAVENOUS

## 2021-06-01 MED ORDER — BUPIVACAINE HCL 0.25 % IJ SOLN
INTRAMUSCULAR | Status: AC
  Filled 2021-06-01: qty 1

## 2021-06-01 MED ORDER — FENTANYL CITRATE PF 50 MCG/ML IJ SOSY: 25.0000 ug | PREFILLED_SYRINGE | INTRAMUSCULAR | Status: DC | PRN

## 2021-06-01 MED ORDER — FENTANYL CITRATE PF 50 MCG/ML IJ SOSY
PREFILLED_SYRINGE | INTRAMUSCULAR | Status: AC
  Filled 2021-06-01: qty 3

## 2021-06-01 MED ORDER — PROPOFOL 10 MG/ML IV BOLUS
INTRAVENOUS | Status: AC
  Filled 2021-06-01: qty 20

## 2021-06-01 MED ORDER — HYDROMORPHONE HCL 1 MG/ML IJ SOLN
1.0000 mg | INTRAMUSCULAR | Status: DC | PRN
  Administered 2021-06-03: 1 mg via INTRAVENOUS
  Filled 2021-06-01: qty 1

## 2021-06-01 MED ORDER — FENTANYL CITRATE (PF) 100 MCG/2ML IJ SOLN
INTRAMUSCULAR | Status: AC
  Filled 2021-06-01: qty 2

## 2021-06-01 MED ORDER — SODIUM CHLORIDE 0.45 % IV SOLN: INTRAVENOUS | Status: DC

## 2021-06-01 MED ORDER — LACTATED RINGERS IV SOLN: INTRAVENOUS | Status: DC

## 2021-06-01 MED ORDER — SODIUM CHLORIDE 0.9 % IR SOLN
Status: DC | PRN
  Administered 2021-06-01: 1000 mL

## 2021-06-01 MED ORDER — ATENOLOL 50 MG PO TABS
100.0000 mg | ORAL_TABLET | Freq: Every day | ORAL | Status: DC
  Administered 2021-06-01: 18:00:00 100 mg via ORAL
  Filled 2021-06-01: qty 2
  Filled 2021-06-01: qty 1

## 2021-06-01 MED ORDER — DEXAMETHASONE SODIUM PHOSPHATE 10 MG/ML IJ SOLN
INTRAMUSCULAR | Status: DC | PRN
  Administered 2021-06-01: 4 mg via INTRAVENOUS

## 2021-06-01 MED ORDER — LIDOCAINE 2% (20 MG/ML) 5 ML SYRINGE
INTRAMUSCULAR | Status: DC | PRN
  Administered 2021-06-01: 100 mg via INTRAVENOUS

## 2021-06-01 SURGICAL SUPPLY — 34 items
APL PRP STRL LF DISP 70% ISPRP (MISCELLANEOUS) ×1
BAG COUNTER SPONGE SURGICOUNT (BAG) IMPLANT
BAG SPNG CNTER NS LX DISP (BAG)
BLADE SURG 15 STRL LF DISP TIS (BLADE) ×2 IMPLANT
BLADE SURG 15 STRL SS (BLADE) ×2
CHLORAPREP W/TINT 26 (MISCELLANEOUS) ×3 IMPLANT
COVER SURGICAL LIGHT HANDLE (MISCELLANEOUS) ×3 IMPLANT
DRAIN CHANNEL 10F 3/8 F FF (DRAIN) ×1 IMPLANT
DRAPE LAPAROTOMY TRNSV 102X78 (DRAPES) ×3 IMPLANT
EVACUATOR SILICONE 100CC (DRAIN) ×1 IMPLANT
GAUZE 4X4 16PLY ~~LOC~~+RFID DBL (SPONGE) ×3 IMPLANT
GAUZE PACKING IODOFORM 1/4X15 (PACKING) ×1 IMPLANT
GAUZE SPONGE 4X4 12PLY STRL (GAUZE/BANDAGES/DRESSINGS) ×3 IMPLANT
GLOVE SURG ORTHO LTX SZ8 (GLOVE) ×3 IMPLANT
GLOVE SURG SYN 7.5  E (GLOVE) ×2
GLOVE SURG SYN 7.5 E (GLOVE) ×1 IMPLANT
GLOVE SURG SYN 7.5 PF PI (GLOVE) ×2 IMPLANT
GLOVE SURG UNDER POLY LF SZ7 (GLOVE) ×3 IMPLANT
GOWN STRL REUS W/TWL LRG LVL3 (GOWN DISPOSABLE) ×3 IMPLANT
GOWN STRL REUS W/TWL XL LVL3 (GOWN DISPOSABLE) ×6 IMPLANT
KIT BASIN OR (CUSTOM PROCEDURE TRAY) ×3 IMPLANT
NDL HYPO 25X1 1.5 SAFETY (NEEDLE) ×2 IMPLANT
NEEDLE HYPO 25X1 1.5 SAFETY (NEEDLE) IMPLANT
PACK BASIC VI WITH GOWN DISP (CUSTOM PROCEDURE TRAY) ×3 IMPLANT
PENCIL SMOKE EVACUATOR (MISCELLANEOUS) ×1 IMPLANT
STRIP CLOSURE SKIN 1/2X4 (GAUZE/BANDAGES/DRESSINGS) ×3 IMPLANT
SUT ETHILON 3 0 PS 1 (SUTURE) ×2 IMPLANT
SUT MNCRL AB 4-0 PS2 18 (SUTURE) ×2 IMPLANT
SUT VIC AB 3-0 SH 27 (SUTURE) ×2
SUT VIC AB 3-0 SH 27XBRD (SUTURE) IMPLANT
SWAB CULTURE ESWAB REG 1ML (MISCELLANEOUS) ×2 IMPLANT
SYR CONTROL 10ML LL (SYRINGE) ×2 IMPLANT
TAPE CLOTH SURG 4X10 WHT LF (GAUZE/BANDAGES/DRESSINGS) ×2 IMPLANT
TOWEL OR 17X26 10 PK STRL BLUE (TOWEL DISPOSABLE) ×9 IMPLANT

## 2021-06-01 NOTE — H&P (View-Only) (Signed)
R axillary US done and reveals a moderate sized fluid collection in his axilla.  I have called the patient on the phone to discuss this result (which we discussed in person yesterday as well).  We discussed we would plan to remove his PAC today and proceed with I&D of the right axillary fluid collection.  We will obtain a new consent for this.  He understands and is agreeable to proceed with this today.  Aaron Weaver 10:37 AM 06/01/2021

## 2021-06-01 NOTE — Anesthesia Procedure Notes (Signed)
Procedure Name: LMA Insertion Date/Time: 06/01/2021 2:48 PM Performed by: Victoriano Lain, CRNA Pre-anesthesia Checklist: Patient identified, Emergency Drugs available, Suction available, Patient being monitored and Timeout performed Patient Re-evaluated:Patient Re-evaluated prior to induction Oxygen Delivery Method: Circle system utilized Preoxygenation: Pre-oxygenation with 100% oxygen Induction Type: IV induction LMA: LMA with gastric port inserted LMA Size: 4.0 Number of attempts: 1 Placement Confirmation: breath sounds checked- equal and bilateral and positive ETCO2 Tube secured with: Tape Dental Injury: Teeth and Oropharynx as per pre-operative assessment

## 2021-06-01 NOTE — Progress Notes (Signed)
Unfortunately, he has MRSA bacteremia.  I am just surprised by this.  He is going need to have the Port-A-Cath taken out.  He also has COVID.  He actually looks good.  He was having some problems with diarrhea.  Every now is developed atrial fibrillation.  Developed this a couple nights ago.  He is on amiodarone drip.  He is back in normal sinus rhythm now.  His blood counts are coming back.  His white count is 2.9.  Hemoglobin 8.8.  Platelet count 62,000.  His blood sugars are on the high side.  For some reason, his LFTs were also high now.  I will know this might be from medications.  He has had a lot of painful urination.  This is a bit better with the Foley catheter in place.  Is no mouth sores.  I am just amazed as to have any problems he has had.  Again, the MRSA bacteremia is clear the biggest issue from my point of view.  The Port-A-Cath has to come out.  He is probably going to need to have a PICC line placed at some point so we do chemotherapy.  I am sure we will have to delay his chemotherapy by a week or so so that we had this MRSA bacteremia can get under good control.  He did have a echocardiogram done yesterday.  There is no obvious valvular issues although the aortic valve cannot be seen all that well.  He had good cardiac motion with a LVEF of 55%.  Again, he has a non-Hodgkin's lymphoma.  He has only had 1 cycle of R-CHOP.  He now has MRSA bacteremia.  The Port-A-Cath is coming out.  I am not sure along a lot to be treated for the bacteremia.  We will have to delay his chemotherapy by 7-10 days.  I do appreciate the incredible care he is getting from everybody up on [place], MD  Jeneen Rinks 1:5

## 2021-06-01 NOTE — Progress Notes (Signed)
R axillary US done and reveals a moderate sized fluid collection in his axilla.  I have called the patient on the phone to discuss this result (which we discussed in person yesterday as well).  We discussed we would plan to remove his PAC today and proceed with I&D of the right axillary fluid collection.  We will obtain a new consent for this.  He understands and is agreeable to proceed with this today.  Aaron Weaver 10:37 AM 06/01/2021

## 2021-06-01 NOTE — Progress Notes (Signed)
PROGRESS NOTE    Aaron MOOMAW Sr.  DQQ:[id number] DOB: April 11, 1956 DOA: 05/29/2021 PCP: [name], MD    Brief Narrative:  66 y.o. male with a history of diabetes mellitus type 2, AAA s/p endovascular repair, COPD, lymphoma recently started chemotherapy. Patient presented secondary to fall and weakness and found to have evidence of SIRS with unknown infectious source. Empiric antibiotics started. Blood cultures significant for MRSA. 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 Complicated by neutropenia in setting of recent chemotherapy. Blood cultures obtained and results confirm MRSA, without source. CT abdomen/pelvis ordered unremarkable for abscess. Started empirically on Vancomycin and Cefepime and switched to Vancomycin monotherapy. ID auto-consulted. -afebrile -Port removed by general surgery 1/25   Atrial fibrillation with RVR ([place]) Likely transient and related to acute illness in addition to discontinuation of atenolol. Patient started on amiodarone drip with load. Patient converted back to sinus rhythm -Had been continued on amiodarone gtt -HR and bp stable, would resume home atenolol and d/c amiodarone   Axillary tenderness, right Patient with a somewhat remote history (05/28/20) excisional lymph node biopsy, still with tenderness and mild erythema around the  site. Possible source for bacteremia -Pt now I/d of abscess 1/25   Acute urinary retention Unknown etiology at this time. Patient has required multiple I/O catheterization. Foley catheter placed on 1/23. Urine culture (1/23) with no growth, but culture obtained after antibiotics initiated. -Continue foley catheter, recommend f/u with urology after d/c   Pancytopenia ([place]) Secondary to recent chemotherapy. ANC of 800. Received G-CSF on 1/6. Platelets trending down. -recheck cbc in AM   AKI (acute kidney injury) ([place]) Baseline creatinine of about 0.8-0.9. Creatinine of 1.24 on admission, initially worsened to 1.29 and has now resolved.   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 Marin Olp and is on chemotherapy. First cycle started 1/13 and he received G-CSF on 1/6 -Continue Famvir for shingles prophylaxis   COPD (chronic obstructive pulmonary disease) ([place])- (present on admission) Stable. Patient is on Servent, albuterol as an outpatient -Continue Brovana (substitute for Servent) -Duoneb prn   AAA (abdominal aortic aneurysm) without rupture- (present on admission) History of repair.   Essential hypertension- (present on admission) Patient with hypotension.  BP now stable Atenolol resumed   DM2 (diabetes mellitus, type 2) ([place]) Patient is on Janumet and glimepiride as an outpatient -Continue SSI   MRSA bacteremia Unknown source. Complicated by port-a-cath in place. ID consulted. Recent Transthoracic Echocardiogram from 1/12 -ID recommendations -Continue Vancomycin   Diarrhea Possibly related to chemotherapy. C. difficile negative. -Supportive care   COVID-19 virus infection Incidental finding. Remdesivir started on 1/23. No steroids secondary to lack of hypoxia. CRP significantly elevated in setting of bacteremia. -Continue Remdesivir IV -Daily CMP, CBC, CRP   HYPERLIPIDEMIA -given elevated  LFT's, will hold statin  DVT prophylaxis: SCD's, consider lovenox when OK with surgery Code Status: Full Family Communication: Pt in room, family at bedside  Status is: Inpatient  Remains inpatient appropriate because: Severity of illness    Consultants:  General Surgery ID Oncology  Procedures:  Removal of infusion port 1/25 I/D R axillary abscess 1/25  Antimicrobials: Anti-infectives (From admission, onward)    Start     Dose/Rate Route Frequency Ordered Stop   05/31/21 1000  [MAR Hold]  remdesivir 100 mg in sodium chloride 0.9 % 100 mL IVPB        (MAR Hold since Wed 06/01/2021 at 1301.Hold Reason: Transfer to a Procedural area)  See Hyperspace for full Linked Orders Report.   100 mg 200 mL/hr over 30 Minutes Intravenous Daily 05/30/21 0727 06/04/21 0959   05/31/21 0915  [MAR Hold]  vancomycin (VANCOREADY) IVPB 1000 mg/200 mL        (MAR Hold since Wed 06/01/2021 at 1301.Hold Reason: Transfer to a Procedural area)   1,000 mg 200 mL/hr over 60 Minutes Intravenous Every 12 hours 05/31/21 0914     05/30/21 1100  vancomycin (VANCOREADY) IVPB 1500 mg/300 mL  Status:  Discontinued        1,500 mg 150 mL/hr over 120 Minutes Intravenous Every 24 hours 05/30/21 0834 05/31/21 0914   05/30/21 1000  vancomycin (VANCOREADY) IVPB 750 mg/150 mL  Status:  Discontinued        750 mg 150 mL/hr over 60 Minutes Intravenous Every 12 hours 05/30/21 0052 05/30/21 0834   05/30/21 1000  remdesivir 200 mg in sodium chloride 0.9% 250 mL IVPB       See Hyperspace for full Linked Orders Report.   200 mg 580 mL/hr over 30 Minutes Intravenous Once 05/30/21 0727 05/30/21 2020   05/29/21 2345  [MAR Hold]  famciclovir (FAMVIR) tablet 250 mg        (MAR Hold since Wed 06/01/2021 at 1301.Hold Reason: Transfer to a Procedural area)   250 mg Oral 2 times daily 05/29/21 2247     05/29/21 2200  aztreonam (AZACTAM) injection 2 g  Status:  Discontinued        2 g Intravenous Every 8 hours 05/29/21 1920 05/29/21  1935   05/29/21 2000  ceFEPIme (MAXIPIME) 2 g in sodium chloride 0.9 % 100 mL IVPB  Status:  Discontinued        2 g 200 mL/hr over 30 Minutes Intravenous Every 8 hours 05/29/21 1936 05/30/21 1423   05/29/21 1930  vancomycin (VANCOCIN) IVPB 1000 mg/200 mL premix        1,000 mg 200 mL/hr over 60 Minutes Intravenous  Once 05/29/21 1921 05/30/21 0102       Subjective: Without complaints   Objective: Vitals:   06/01/21 1248 06/01/21 1552 06/01/21 1600 06/01/21 1615  BP: 126/80 130/86 127/86 125/86  Pulse:  75 74 70  Resp:  20 20 19   Temp: 98.1 F (36.7 C) (!) 97.2 F (36.2 C)    TempSrc: Oral     SpO2:  100% 100% 91%  Weight:      Height:        Intake/Output Summary (Last 24 hours) at 06/01/2021 1637 Last data filed at 06/01/2021 1543 Gross per 24 hour  Intake 3372.92 ml  Output 2705 ml  Net 667.92 ml   Filed Weights   05/29/21 2201  Weight: 80.5 kg    Examination: General exam: Awake, laying in bed, in nad Respiratory system: Normal respiratory effort, no wheezing Cardiovascular system:  regular rate, s1, s2 Gastrointestinal system: Soft, nondistended, positive BS Central nervous system: CN2-12 grossly intact, strength intact Extremities: Perfused, no clubbing Skin: Normal skin turgor, no notable skin lesions seen Psychiatry: Mood normal // no visual hallucinations   Data Reviewed: I have personally reviewed following labs and imaging studies  CBC: Recent Labs  Lab 05/29/21 1538 05/30/21 0450 05/31/21 0411 06/01/21 0528  WBC 0.3* 1.1* 1.5* 2.9*  NEUTROABS 0.1* 0.8* 1.1* 2.4  HGB 12.8* 11.5* 9.2* 8.8*  HCT 37.9* 33.1* 26.8* 25.2*  MCV 82.9 81.9 82.2 81.6  PLT 79* 56* 43* 62*   Basic Metabolic Panel: Recent Labs  Lab 05/29/21 1538 05/30/21 0450 05/31/21 0411 06/01/21 0528  NA 130* 132* 135 132*  K 4.7 3.8 2.8* 3.3*  CL 96* 101 103 103  CO2 22 20* 23 22  GLUCOSE 428* 304* 126* 173*  BUN 15 18 18 14   CREATININE 1.24 1.29* 0.76 0.75  CALCIUM  8.4* 7.8* 7.2* 7.1*  MG  --   --  1.6*  --    GFR: Estimated Creatinine Clearance: 98.3 mL/min (by C-G formula based on SCr of 0.75 mg/dL). Liver Function Tests: Recent Labs  Lab 05/29/21 1538 05/30/21 0450 05/31/21 0411 06/01/21 0528  AST 18 17 22  167*  ALT 25 21 21  126*  ALKPHOS 57 45 39 58  BILITOT 1.6* 1.3* 0.9 0.7  PROT 7.0 6.4* 5.5* 5.1*  ALBUMIN 3.3* 2.9* 2.3* 2.2*   Recent Labs  Lab 05/29/21 2055  LIPASE 21   No results for input(s): AMMONIA in the last 168 hours. Coagulation Profile: Recent Labs  Lab 05/29/21 1538  INR 1.2   Cardiac Enzymes: No results for input(s): CKTOTAL, CKMB, CKMBINDEX, TROPONINI in the last 168 hours. BNP (last 3 results) No results for input(s): PROBNP in the last 8760 hours. HbA1C: No results for input(s): HGBA1C in the last 72 hours. CBG: Recent Labs  Lab 05/31/21 1737 05/31/21 2028 06/01/21 0724 06/01/21 1131 06/01/21 1305  GLUCAP 159* 220* 159* 189* 206*   Lipid Profile: No results for input(s): CHOL, HDL, LDLCALC, TRIG, CHOLHDL, LDLDIRECT in the last 72 hours. Thyroid Function Tests: No results for input(s): TSH, T4TOTAL, FREET4, T3FREE, THYROIDAB in the last 72 hours. Anemia Panel: No results for input(s): VITAMINB12, FOLATE, FERRITIN, TIBC, IRON, RETICCTPCT in the last 72 hours. Sepsis Labs: Recent Labs  Lab 05/29/21 1538 05/29/21 1738 05/29/21 2320 05/30/21 0012  LATICACIDVEN 3.8* 2.8* 2.8* 2.7*    Recent Results (from the past 240 hour(s))  Culture, blood (Routine x 2)     Status: Abnormal   Collection Time: 05/29/21  3:38 PM   Specimen: BLOOD  Result Value Ref Range Status   Specimen Description   Final    BLOOD RIGHT ANTECUBITAL Performed at [facility] [address], [place], [place] [address]    Special Requests   Final    BOTTLES DRAWN AEROBIC AND ANAEROBIC Blood Culture adequate volume Performed at [facility] [address], [place], [place] [address]     Culture  Setup Time   Final    GRAM POSITIVE COCCI IN CLUSTERS AEROBIC BOTTLE ONLY CRITICAL RESULT CALLED TO, READ BACK BY AND VERIFIED WITH: Damian Leavell [id number] AT 1559 BY CM Performed at [facility], [place] [address], [place],  [address]    Culture METHICILLIN RESISTANT STAPHYLOCOCCUS AUREUS (A)  Final   Report Status 06/01/2021 FINAL  Final   Organism ID, Bacteria METHICILLIN RESISTANT STAPHYLOCOCCUS AUREUS  Final  Susceptibility   Methicillin resistant staphylococcus aureus - MIC*    CIPROFLOXACIN >=8 RESISTANT Resistant     ERYTHROMYCIN >=8 RESISTANT Resistant     GENTAMICIN <=0.5 SENSITIVE Sensitive     OXACILLIN >=4 RESISTANT Resistant     TETRACYCLINE <=1 SENSITIVE Sensitive     VANCOMYCIN 1 SENSITIVE Sensitive     TRIMETH/SULFA <=10 SENSITIVE Sensitive     CLINDAMYCIN <=0.25 SENSITIVE Sensitive     RIFAMPIN <=0.5 SENSITIVE Sensitive     Inducible Clindamycin NEGATIVE Sensitive     * METHICILLIN RESISTANT STAPHYLOCOCCUS AUREUS  Blood Culture ID Panel (Reflexed)     Status: Abnormal   Collection Time: 05/29/21  3:38 PM  Result Value Ref Range Status   Enterococcus faecalis NOT DETECTED NOT DETECTED Final   Enterococcus Faecium NOT DETECTED NOT DETECTED Final   Listeria monocytogenes NOT DETECTED NOT DETECTED Final   Staphylococcus species DETECTED (A) NOT DETECTED Final    Comment: CRITICAL RESULT CALLED TO, READ BACK BY AND VERIFIED WITH: PHARMD J GADHIA [phone] AT 1300 BY CM    Staphylococcus aureus (BCID) DETECTED (A) NOT DETECTED Final    Comment: Methicillin (oxacillin)-resistant Staphylococcus aureus (MRSA). MRSA is predictably resistant to beta-lactam antibiotics (except ceftaroline). Preferred therapy is vancomycin unless clinically contraindicated. Patient requires contact precautions if  hospitalized. CRITICAL RESULT CALLED TO, READ BACK BY AND VERIFIED WITH: PHARMD J GADHIA [phone] AT 1300 BY CM    Staphylococcus epidermidis NOT DETECTED NOT  DETECTED Final   Staphylococcus lugdunensis NOT DETECTED NOT DETECTED Final   Streptococcus species NOT DETECTED NOT DETECTED Final   Streptococcus agalactiae NOT DETECTED NOT DETECTED Final   Streptococcus pneumoniae NOT DETECTED NOT DETECTED Final   Streptococcus pyogenes NOT DETECTED NOT DETECTED Final   A.calcoaceticus-baumannii NOT DETECTED NOT DETECTED Final   Bacteroides fragilis NOT DETECTED NOT DETECTED Final   Enterobacterales NOT DETECTED NOT DETECTED Final   Enterobacter cloacae complex NOT DETECTED NOT DETECTED Final   Escherichia coli NOT DETECTED NOT DETECTED Final   Klebsiella aerogenes NOT DETECTED NOT DETECTED Final   Klebsiella oxytoca NOT DETECTED NOT DETECTED Final   Klebsiella pneumoniae NOT DETECTED NOT DETECTED Final   Proteus species NOT DETECTED NOT DETECTED Final   Salmonella species NOT DETECTED NOT DETECTED Final   Serratia marcescens NOT DETECTED NOT DETECTED Final   Haemophilus influenzae NOT DETECTED NOT DETECTED Final   Neisseria meningitidis NOT DETECTED NOT DETECTED Final   Pseudomonas aeruginosa NOT DETECTED NOT DETECTED Final   Stenotrophomonas maltophilia NOT DETECTED NOT DETECTED Final   Candida albicans NOT DETECTED NOT DETECTED Final   Candida auris NOT DETECTED NOT DETECTED Final   Candida glabrata NOT DETECTED NOT DETECTED Final   Candida krusei NOT DETECTED NOT DETECTED Final   Candida parapsilosis NOT DETECTED NOT DETECTED Final   Candida tropicalis NOT DETECTED NOT DETECTED Final   Cryptococcus neoformans/gattii NOT DETECTED NOT DETECTED Final   Meth resistant mecA/C and MREJ DETECTED (A) NOT DETECTED Final    Comment: CRITICAL RESULT CALLED TO, READ BACK BY AND VERIFIED WITH: Damian Leavell [phone] AT 1300 BY CM Performed at [facility], [address], [place], [place] [address]   Culture, blood (Routine x 2)     Status: None (Preliminary result)   Collection Time: 05/29/21  3:43 PM   Specimen: BLOOD  Result Value Ref Range  Status   Specimen Description   Final    BLOOD LEFT HAND Performed at [facility] [address] [place],  [place] [address]    Special Requests   Final    BOTTLES DRAWN AEROBIC AND ANAEROBIC Blood Culture adequate volume Performed at [facility] [address] [place], [place] [address]    Culture   Final    NO GROWTH 2 DAYS Performed at [facility] [address], [place], [place] [address]    Report Status PENDING  Incomplete  Resp Panel by RT-PCR (Flu A&B, Covid)     Status: Abnormal   Collection Time: 05/29/21  7:19 PM   Specimen: Nasopharyngeal(NP) swabs in vial transport medium  Result Value Ref Range Status   SARS Coronavirus 2 by RT PCR POSITIVE (A) NEGATIVE Final    Comment: (NOTE) SARS-CoV-2 target nucleic acids are DETECTED.  The SARS-CoV-2 RNA is generally detectable in upper respiratory specimens during the acute phase of infection. Positive results are indicative of the presence of the identified virus, but do not rule out bacterial infection or co-infection with other pathogens not detected by the test. Clinical correlation with patient history and other diagnostic information is necessary to determine patient infection status. The expected result is Negative.  Fact Sheet for Patients: EntrepreneurPulse.com.au  Fact Sheet for Healthcare Providers: IncredibleEmployment.be  This test is not yet approved or cleared by the [place] FDA and  has been authorized for detection and/or diagnosis of SARS-CoV-2 by FDA under an Emergency Use Authorization (EUA).  This EUA will remain in effect (meaning this test can be used) for the duration of  the COVID-19 declaration under Section 564(b)(1) of the A ct, 21 U.S.C. section 360bbb-3(b)(1), unless the authorization is terminated or revoked sooner.     Influenza A by PCR NEGATIVE NEGATIVE Final   Influenza B by PCR NEGATIVE  NEGATIVE Final    Comment: (NOTE) The Xpert Xpress SARS-CoV-2/FLU/RSV plus assay is intended as an aid in the diagnosis of influenza from Nasopharyngeal swab specimens and should not be used as a sole basis for treatment. Nasal washings and aspirates are unacceptable for Xpert Xpress SARS-CoV-2/FLU/RSV testing.  Fact Sheet for Patients: EntrepreneurPulse.com.au  Fact Sheet for Healthcare Providers: IncredibleEmployment.be  This test is not yet approved or cleared by the [place] FDA and has been authorized for detection and/or diagnosis of SARS-CoV-2 by FDA under an Emergency Use Authorization (EUA). This EUA will remain in effect (meaning this test can be used) for the duration of the COVID-19 declaration under Section 564(b)(1) of the Act, 21 U.S.C. section 360bbb-3(b)(1), unless the authorization is terminated or revoked.  Performed at [facility], [place] [address] [place], [place] [address]   C Difficile Quick Screen w PCR reflex     Status: None   Collection Time: 05/30/21  8:42 AM   Specimen: STOOL  Result Value Ref Range Status   C Diff antigen NEGATIVE NEGATIVE Final   C Diff toxin NEGATIVE NEGATIVE Final   C Diff interpretation No C. difficile detected.  Final    Comment: Performed at [facility], [place] [address], [place], [place] [address]  Urine Culture     Status: None   Collection Time: 05/30/21  1:44 PM   Specimen: Urine, Catheterized  Result Value Ref Range Status   Specimen Description   Final    URINE, CATHETERIZED Performed at [facility] [address] [place], [place] [address]    Special Requests   Final    NONE Performed at [facility], [place] [address] [place], [place] [address]  Culture   Final    NO GROWTH Performed at [facility], [place] [address] [place], [place] [address]    Report Status 05/31/2021 FINAL  Final      Radiology Studies: [place] AXILLA RIGHT  Result Date: 06/01/2021 CLINICAL DATA:  Right axillary abscess. EXAM: ULTRASOUND OF THE RIGHT AXILLA COMPARISON:  Chest CTA dated 05/29/2021. FINDINGS: Ultrasound is performed, showing an irregular fluid collection in the right axilla measuring 8.4 x 5.9 x 1.9 cm. This contains some thin internal septations. This also has some internal solid appearing echogenic components or invaginations of fat. IMPRESSION: 8.4 x 5.9 x 1.9 cm irregular, mildly complicated fluid collection in the right axilla. This is compatible with the patient's known abscess. Electronically Signed   By: Claudie Revering M.D.   On: 06/01/2021 10:13   ECHOCARDIOGRAM LIMITED  Result Date: 05/31/2021    ECHOCARDIOGRAM LIMITED REPORT   Patient Name:   Aaron CANSLER Sr. Date of Exam: 05/31/2021 Medical Rec #:  [id number]          Height:       72.0 in Accession #:    [id number]         Weight:       177.5 lb Date of Birth:  1955/11/03         BSA:          2.025 m Patient Age:    69 years           BP:           112/79 mmHg Patient Gender: M                  HR:           82 bpm. Exam Location:  Inpatient Procedure: Limited Echo and Cardiac Doppler Indications:    Bacteremia R78.81  History:        Patient has prior history of Echocardiogram examinations, most                 recent 05/19/2021. COPD; Risk Factors:Current Smoker,                 Hypertension, Dyslipidemia and Diabetes. COVID 19. Lymphoma.                 GERD.  Sonographer:    Darlina Sicilian RDCS Referring Phys: [place]  1. Left ventricular ejection fraction, by estimation, is 55%. The left ventricle has normal function. The left ventricle has no regional wall motion abnormalities.  2. Right ventricular systolic function is normal. The right ventricular size is normal.  3. The mitral valve is normal in structure. No evidence of mitral valve regurgitation.  4. The aortic valve was not well visualized. Aortic valve  regurgitation is not visualized.  5. Limited study for evalation of infective endocarditis. No valve lesions seen, but aortic valve and pulmonic valve note well visualized. Comparison(s): No significant change from prior study. Conclusion(s)/Recommendation(s): No evidence of valvular vegetations on this transthoracic echocardiogram. Consider a transesophageal echocardiogram to exclude infective endocarditis if clinically indicated. FINDINGS  Left Ventricle: Left ventricular ejection fraction, by estimation, is 55%. The left ventricle has normal function. The left ventricle has no regional wall motion abnormalities. Right Ventricle: The right ventricular size is normal. Right ventricular systolic function is normal. Mitral Valve: The mitral valve is normal in structure. Tricuspid Valve: The tricuspid valve is normal in structure. Tricuspid valve regurgitation is not demonstrated. No evidence of  tricuspid stenosis. Aortic Valve: The aortic valve was not well visualized. Aortic valve regurgitation is not visualized. Pulmonic Valve: The pulmonic valve was not well visualized. Pulmonic valve regurgitation is not visualized. No evidence of pulmonic stenosis. Rudean Haskell MD Electronically signed by Rudean Haskell MD Signature Date/Time: 05/31/2021/3:41:42 PM    Final     Scheduled Meds:  [MAR Hold] arformoterol  15 mcg Nebulization BID   [MAR Hold] aspirin EC  81 mg Oral Q0600   atenolol  100 mg Oral Daily   [MAR Hold] Chlorhexidine Gluconate Cloth  6 each Topical Daily   [MAR Hold] DULoxetine  60 mg Oral QHS   [MAR Hold] famciclovir  250 mg Oral BID   [MAR Hold] feeding supplement (NEPRO CARB STEADY)  237 mL Oral BID BM   fentaNYL       [MAR Hold] insulin aspart  0-9 Units Subcutaneous TID WC   [MAR Hold] multivitamin with minerals  1 tablet Oral Daily   [MAR Hold] pantoprazole  40 mg Oral Daily   [MAR Hold] potassium chloride  40 mEq Oral BID   [MAR Hold] sodium chloride flush  10-40 mL  Intracatheter Q12H   [MAR Hold] tamsulosin  0.4 mg Oral QPM   Continuous Infusions:  lactated ringers 125 mL/hr at 06/01/21 0303   lactated ringers 50 mL/hr at 06/01/21 1441   [MAR Hold] remdesivir 100 mg in NS 100 mL 100 mg (06/01/21 0851)   [MAR Hold] vancomycin 1,000 mg (06/01/21 1045)     LOS: 2 days   Marylu Lund, MD [facility] Pager On Amion  If 7PM-7AM, please contact night-coverage 06/01/2021, 4:37 PM

## 2021-06-01 NOTE — Anesthesia Preprocedure Evaluation (Addendum)
Anesthesia Evaluation  Patient identified by MRN, date of birth, ID band Patient awake    Reviewed: Allergy & Precautions, NPO status , Patient's Chart, lab work & pertinent test results, reviewed documented beta blocker date and time   History of Anesthesia Complications Negative for: history of anesthetic complications  Airway Mallampati: II  TM Distance: >3 FB Neck ROM: Full    Dental no notable dental hx.    Pulmonary COPD, former smoker,  COVID positive   Pulmonary exam normal        Cardiovascular hypertension, Pt. on home beta blockers and Pt. on medications Normal cardiovascular exam+ dysrhythmias (on amio gtt) Atrial Fibrillation   HLD  TTE 2023 1. Left ventricular ejection fraction, by estimation, is 55%. The left  ventricle has normal function. The left ventricle has no regional wall  motion abnormalities.  2. Right ventricular systolic function is normal. The right ventricular  size is normal.  3. The mitral valve is normal in structure. No evidence of mitral valve  regurgitation.  4. The aortic valve was not well visualized. Aortic valve regurgitation  is not visualized.  5. Limited study for evalation of infective endocarditis. No valve  lesions seen, but aortic valve and pulmonic valve note well visualized.    Neuro/Psych PSYCHIATRIC DISORDERS Anxiety Depression negative neurological ROS     GI/Hepatic Neg liver ROS, GERD  ,  Endo/Other  diabetes  Renal/GU negative Renal ROS  negative genitourinary   Musculoskeletal negative musculoskeletal ROS (+)   Abdominal   Peds  Hematology  (+) Blood dyscrasia, anemia , Lab Results      Component                Value               Date                      WBC                      2.9 (L)             06/01/2021                HGB                      8.8 (L)             06/01/2021                HCT                      25.2 (L)            06/01/2021                 MCV                      81.6                06/01/2021                PLT                      62 (L)              06/01/2021              Anesthesia Other Findings 66 y.o. male with history of  diabetes mellitus type 2, abdominal aortic aneurysm status post endovascular repair, COPD was recently diagnosed with lymphoma had underwent chemotherapy on May 20, 2021 following which patient also received Neulasta on January 16 presents to the ER because of weakness fall and not feeling well found to have MRSA bacteremia.   Reproductive/Obstetrics                         Anesthesia Physical Anesthesia Plan  ASA: 3  Anesthesia Plan: General   Post-op Pain Management:    Induction: Intravenous  PONV Risk Score and Plan: 2 and Ondansetron, Dexamethasone and Midazolam  Airway Management Planned: LMA  Additional Equipment:   Intra-op Plan:   Post-operative Plan: Extubation in OR  Informed Consent: I have reviewed the patients History and Physical, chart, labs and discussed the procedure including the risks, benefits and alternatives for the proposed anesthesia with the patient or authorized representative who has indicated his/her understanding and acceptance.     Dental advisory given  Plan Discussed with: CRNA  Anesthesia Plan Comments:         Anesthesia Quick Evaluation

## 2021-06-01 NOTE — Progress Notes (Signed)
[facility] for Infectious Disease   Reason for visit: Follow up on bacteremia  Interval History: 1/4 positive blood cultures with MRSA; repeat cultures sent; ultrasound of right axilla with fluid collection.  PAC and debridement of fluid today per surgery.    Physical Exam: Constitutional:  Vitals:   06/01/21 0855 06/01/21 1248  BP:  126/80  Pulse:    Resp:    Temp:  98.1 F (36.7 C)  SpO2: 97%    patient appears in NAD Respiratory: Normal respiratory effort; CTA B Cardiovascular: RRR GI: soft, nt, nd  Review of Systems: Constitutional: negative for fevers and chills Gastrointestinal: negative for nausea and diarrhea  Lab Results  Component Value Date   WBC 2.9 (L) 06/01/2021   HGB 8.8 (L) 06/01/2021   HCT 25.2 (L) 06/01/2021   MCV 81.6 06/01/2021   PLT 62 (L) 06/01/2021    Lab Results  Component Value Date   CREATININE 0.75 06/01/2021   BUN 14 06/01/2021   NA 132 (L) 06/01/2021   K 3.3 (L) 06/01/2021   CL 103 06/01/2021   CO2 22 06/01/2021    Lab Results  Component Value Date   ALT 126 (H) 06/01/2021   AST 167 (H) 06/01/2021   ALKPHOS 58 06/01/2021     Microbiology: Recent Results (from the past 240 hour(s))  Culture, blood (Routine x 2)     Status: Abnormal   Collection Time: 05/29/21  3:38 PM   Specimen: BLOOD  Result Value Ref Range Status   Specimen Description   Final    BLOOD RIGHT ANTECUBITAL Performed at [facility], [place] [address], [place], [place] [address]    Special Requests   Final    BOTTLES DRAWN AEROBIC AND ANAEROBIC Blood Culture adequate volume Performed at [facility] [address] [place], [place] [address]    Culture  Setup Time   Final    GRAM POSITIVE COCCI IN CLUSTERS AEROBIC BOTTLE ONLY CRITICAL RESULT CALLED TO, READ BACK BY AND VERIFIED WITH: Damian Leavell [id number] AT 1559 BY CM Performed at [facility], [place] [address] [place], [place] [address]    Culture  METHICILLIN RESISTANT STAPHYLOCOCCUS AUREUS (A)  Final   Report Status 06/01/2021 FINAL  Final   Organism ID, Bacteria METHICILLIN RESISTANT STAPHYLOCOCCUS AUREUS  Final      Susceptibility   Methicillin resistant staphylococcus aureus - MIC*    CIPROFLOXACIN >=8 RESISTANT Resistant     ERYTHROMYCIN >=8 RESISTANT Resistant     GENTAMICIN <=0.5 SENSITIVE Sensitive     OXACILLIN >=4 RESISTANT Resistant     TETRACYCLINE <=1 SENSITIVE Sensitive     VANCOMYCIN 1 SENSITIVE Sensitive     TRIMETH/SULFA <=10 SENSITIVE Sensitive     CLINDAMYCIN <=0.25 SENSITIVE Sensitive     RIFAMPIN <=0.5 SENSITIVE Sensitive     Inducible Clindamycin NEGATIVE Sensitive     * METHICILLIN RESISTANT STAPHYLOCOCCUS AUREUS  Blood Culture ID Panel (Reflexed)     Status: Abnormal   Collection Time: 05/29/21  3:38 PM  Result Value Ref Range Status   Enterococcus faecalis NOT DETECTED NOT DETECTED Final   Enterococcus Faecium NOT DETECTED NOT DETECTED Final   Listeria monocytogenes NOT DETECTED NOT DETECTED Final   Staphylococcus species DETECTED (A) NOT DETECTED Final    Comment: CRITICAL RESULT CALLED TO, READ BACK BY AND VERIFIED WITH: PHARMD J GADHIA [id number] AT 1300 BY CM    Staphylococcus aureus (BCID) DETECTED (A) NOT DETECTED Final  Comment: Methicillin (oxacillin)-resistant Staphylococcus aureus (MRSA). MRSA is predictably resistant to beta-lactam antibiotics (except ceftaroline). Preferred therapy is vancomycin unless clinically contraindicated. Patient requires contact precautions if  hospitalized. CRITICAL RESULT CALLED TO, READ BACK BY AND VERIFIED WITH: PHARMD J GADHIA [id number] AT 1300 BY CM    Staphylococcus epidermidis NOT DETECTED NOT DETECTED Final   Staphylococcus lugdunensis NOT DETECTED NOT DETECTED Final   Streptococcus species NOT DETECTED NOT DETECTED Final   Streptococcus agalactiae NOT DETECTED NOT DETECTED Final   Streptococcus pneumoniae NOT DETECTED NOT DETECTED Final   Streptococcus  pyogenes NOT DETECTED NOT DETECTED Final   A.calcoaceticus-baumannii NOT DETECTED NOT DETECTED Final   Bacteroides fragilis NOT DETECTED NOT DETECTED Final   Enterobacterales NOT DETECTED NOT DETECTED Final   Enterobacter cloacae complex NOT DETECTED NOT DETECTED Final   Escherichia coli NOT DETECTED NOT DETECTED Final   Klebsiella aerogenes NOT DETECTED NOT DETECTED Final   Klebsiella oxytoca NOT DETECTED NOT DETECTED Final   Klebsiella pneumoniae NOT DETECTED NOT DETECTED Final   Proteus species NOT DETECTED NOT DETECTED Final   Salmonella species NOT DETECTED NOT DETECTED Final   Serratia marcescens NOT DETECTED NOT DETECTED Final   Haemophilus influenzae NOT DETECTED NOT DETECTED Final   Neisseria meningitidis NOT DETECTED NOT DETECTED Final   Pseudomonas aeruginosa NOT DETECTED NOT DETECTED Final   Stenotrophomonas maltophilia NOT DETECTED NOT DETECTED Final   Candida albicans NOT DETECTED NOT DETECTED Final   Candida auris NOT DETECTED NOT DETECTED Final   Candida glabrata NOT DETECTED NOT DETECTED Final   Candida krusei NOT DETECTED NOT DETECTED Final   Candida parapsilosis NOT DETECTED NOT DETECTED Final   Candida tropicalis NOT DETECTED NOT DETECTED Final   Cryptococcus neoformans/gattii NOT DETECTED NOT DETECTED Final   Meth resistant mecA/C and MREJ DETECTED (A) NOT DETECTED Final    Comment: CRITICAL RESULT CALLED TO, READ BACK BY AND VERIFIED WITH: Damian Leavell [phone] AT 1300 BY CM Performed at [facility], [address], [place], [place] [address]   Culture, blood (Routine x 2)     Status: None (Preliminary result)   Collection Time: 05/29/21  3:43 PM   Specimen: BLOOD  Result Value Ref Range Status   Specimen Description   Final    BLOOD LEFT HAND Performed at [facility] [address] [place], [place] [address]    Special Requests   Final    BOTTLES DRAWN AEROBIC AND ANAEROBIC Blood Culture adequate volume Performed at [facility] [address] [place], [place] [address]    Culture   Final    NO GROWTH 2 DAYS Performed at [facility] [address] [place], [place] [address]    Report Status PENDING  Incomplete  Resp Panel by RT-PCR (Flu A&B, Covid)     Status: Abnormal   Collection Time: 05/29/21  7:19 PM   Specimen: Nasopharyngeal(NP) swabs in vial transport medium  Result Value Ref Range Status   SARS Coronavirus 2 by RT PCR POSITIVE (A) NEGATIVE Final    Comment: (NOTE) SARS-CoV-2 target nucleic acids are DETECTED.  The SARS-CoV-2 RNA is generally detectable in upper respiratory specimens during the acute phase of infection. Positive results are indicative of the presence of the identified virus, but do not rule out bacterial infection or co-infection with other pathogens not detected by the test. Clinical correlation with patient history and other diagnostic information is necessary to determine patient infection status. The expected result is  Negative.  Fact Sheet for Patients: EntrepreneurPulse.com.au  Fact Sheet for Healthcare Providers: IncredibleEmployment.be  This test is not yet approved or cleared by the [place] FDA and  has been authorized for detection and/or diagnosis of SARS-CoV-2 by FDA under an Emergency Use Authorization (EUA).  This EUA will remain in effect (meaning this test can be used) for the duration of  the COVID-19 declaration under Section 564(b)(1) of the A ct, 21 U.S.C. section 360bbb-3(b)(1), unless the authorization is terminated or revoked sooner.     Influenza A by PCR NEGATIVE NEGATIVE Final   Influenza B by PCR NEGATIVE NEGATIVE Final    Comment: (NOTE) The Xpert Xpress SARS-CoV-2/FLU/RSV plus assay is intended as an aid in the diagnosis of influenza from Nasopharyngeal swab specimens and should not be used as a sole basis for treatment. Nasal washings and aspirates are unacceptable  for Xpert Xpress SARS-CoV-2/FLU/RSV testing.  Fact Sheet for Patients: EntrepreneurPulse.com.au  Fact Sheet for Healthcare Providers: IncredibleEmployment.be  This test is not yet approved or cleared by the [place] FDA and has been authorized for detection and/or diagnosis of SARS-CoV-2 by FDA under an Emergency Use Authorization (EUA). This EUA will remain in effect (meaning this test can be used) for the duration of the COVID-19 declaration under Section 564(b)(1) of the Act, 21 U.S.C. section 360bbb-3(b)(1), unless the authorization is terminated or revoked.  Performed at [facility], [place] [address] [place], [place] [address]   C Difficile Quick Screen w PCR reflex     Status: None   Collection Time: 05/30/21  8:42 AM   Specimen: STOOL  Result Value Ref Range Status   C Diff antigen NEGATIVE NEGATIVE Final   C Diff toxin NEGATIVE NEGATIVE Final   C Diff interpretation No C. difficile detected.  Final    Comment: Performed at [facility], [place] [address], [place], [place] [address]  Urine Culture     Status: None   Collection Time: 05/30/21  1:44 PM   Specimen: Urine, Catheterized  Result Value Ref Range Status   Specimen Description   Final    URINE, CATHETERIZED Performed at [facility] [address] [place], [place] [address]    Special Requests   Final    NONE Performed at [facility], [place] [address], [place], [place] [address]    Culture   Final    NO GROWTH Performed at [facility], [place] [address], [place], [place] [address]    Report Status 05/31/2021 FINAL  Final    Impression/Plan:  1. Bacteremia - MRSA and on vancomycin.  Repeat blood cultures sent.  TTE without obvious vegeation.   I have requested a TEE  2.  Port-a-cath - being removed today due to #1.  Will place a picc line in 2-3 days if repeat blood cultures remain  negative and he can use that for antibiotics and chemo  3.  Fluid collection in axilla - ? If abscess.  For debridement today by surgery.

## 2021-06-01 NOTE — Interval H&P Note (Signed)
History and Physical Interval Note:  06/01/2021 2:52 PM  Aaron Fee Sr.  has presented today for surgery, with the diagnosis of BACTEREMIA OF PORT A CATH.  The various methods of treatment have been discussed with the patient and family. After consideration of risks, benefits and other options for treatment, the patient has consented to    Procedure(s): REMOVAL PORT-A-CATH (N/A) as a surgical intervention.  Drainage of right axillary abscess.  The patient's history has been reviewed, patient examined, no change in status, stable for surgery.  I have reviewed the patient's chart and labs.  Questions were answered to the patient's satisfaction.    Armandina Gemma, [facility] Office: [place]

## 2021-06-01 NOTE — Plan of Care (Signed)
Plan of care reviewed with patient and pt expressed an understanding . No questioned asked at this time

## 2021-06-01 NOTE — Transfer of Care (Signed)
Immediate Anesthesia Transfer of Care Note  Patient: [name]  Procedure(s) Performed: REMOVAL PORT-A-CATH  Patient Location: PACU  Anesthesia Type:General  Level of Consciousness: drowsy and patient cooperative  Airway & Oxygen Therapy: Patient Spontanous Breathing and Patient connected to face mask oxygen  Post-op Assessment: Report given to RN and Post -op Vital signs reviewed and stable  Post vital signs: Reviewed and stable  Last Vitals:  Vitals Value Taken Time  BP 127/86 06/01/21 1600  Temp    Pulse 74 06/01/21 1613  Resp 16 06/01/21 1613  SpO2 91 % 06/01/21 1613  Vitals shown include unvalidated device data.  Last Pain:  Vitals:   06/01/21 1552  TempSrc:   PainSc: 4          Complications: No notable events documented.

## 2021-06-01 NOTE — Op Note (Signed)
Operative Note  Pre-operative Diagnosis:  MRSA bacteremia  Post-operative Diagnosis:  same  Surgeon:  Armandina Gemma, MD  Assistant:  none   Procedure:  1. Removal of infusion port  2. Drainage of right axillary abscess  Anesthesia:  general  Estimated Blood Loss:  minimal  Drains: 10 mm fully fluted JP drain right axilla, iodoform packing to port site         Specimen: aerobic cultures to lab  Indications: Patient with a 66 year old male on chemotherapy for non-Hodgkin's lymphoma.  Patient developed MRSA bacteremia.  Medical service desires removal of infusion port.  Patient had also undergone a right axillary lymph node excisional biopsy in December 2022.  Ultrasound shows a large fluid collection possibly representing abscess.  Patient will undergo drainage of the fluid collection during this procedure.  Procedure:  The patient was seen in the pre-op holding area. The risks, benefits, complications, treatment options, and expected outcomes were previously discussed with the patient. The patient agreed with the proposed plan and has signed the informed consent form.  The patient was brought to the operating room by the surgical team, identified as Aaron Fee Sr. and the procedure verified. A "time out" was completed and the above information confirmed.  Following administration of general anesthesia the patient was positioned and then prepped and draped in the usual aseptic fashion.  After ascertaining that an adequate level of anesthesia been achieved, the previous infusion port site is reopened with a #15 blade.  Dissection was carried through subcutaneous tissues.  Port is identified.  Pocket is widely opened.  Aerobic cultures are obtained of the small amount of fluid present around the port.  Suture material securing the port to the chest wall was removed in its entirety.  The entire port is then removed with the catheter.  The catheter tract is sutured closed with a 3-0 Vicryl  figure-of-eight suture.  The fibrous sheath is cauterized with the electrocautery.  Cavity is irrigated copiously with warm saline.  Subcutaneous tissues are loosely closed with interrupted 3-0 Vicryl sutures.  Quarter inch iodoform gauze packing is placed into the cavity with the tail of the iodoform lying on the skin surface.  Next an incision was made through the previous surgical scar in the right axilla for approximately 1 cm.  Dissection was carried through subcutaneous tissues and using a hemostat the abscess cavity was entered.  A large volume of cloudy pink fluid is evacuated.  A 10 mm fully fluted Jackson-Pratt drain is then cut to the appropriate length and inserted through the incision into the abscess cavity.  The incision is closed with the drain secured using two 3-0 nylon sutures.  Drain is placed to bulb suction.  Aerobic cultures are obtained from the abscess cavity prior to closure.  Wounds are washed and dried.  Dry gauze dressings were placed over the Port-A-Cath site.  A drain sponges placed around the base of the drain in the right axilla.  Patient is awakened from anesthesia and transported to the recovery room.  The patient tolerated the procedure well.   Armandina Gemma, [facility] Office: [phone]

## 2021-06-02 ENCOUNTER — Other Ambulatory Visit: Payer: Self-pay

## 2021-06-02 ENCOUNTER — Encounter (HOSPITAL_COMMUNITY): Payer: Self-pay | Admitting: Surgery

## 2021-06-02 DIAGNOSIS — R338 Other retention of urine: Secondary | ICD-10-CM | POA: Diagnosis not present

## 2021-06-02 DIAGNOSIS — L0291 Cutaneous abscess, unspecified: Secondary | ICD-10-CM | POA: Diagnosis not present

## 2021-06-02 DIAGNOSIS — A419 Sepsis, unspecified organism: Secondary | ICD-10-CM | POA: Diagnosis not present

## 2021-06-02 DIAGNOSIS — R6520 Severe sepsis without septic shock: Secondary | ICD-10-CM | POA: Diagnosis not present

## 2021-06-02 LAB — CBC WITH DIFFERENTIAL/PLATELET
Abs Immature Granulocytes: 0.29 10*3/uL — ABNORMAL HIGH (ref 0.00–0.07)
Basophils Absolute: 0.1 10*3/uL (ref 0.0–0.1)
Basophils Relative: 1 %
Eosinophils Absolute: 0 10*3/uL (ref 0.0–0.5)
Eosinophils Relative: 0 %
HCT: 30 % — ABNORMAL LOW (ref 39.0–52.0)
Hemoglobin: 10.2 g/dL — ABNORMAL LOW (ref 13.0–17.0)
Immature Granulocytes: 5 %
Lymphocytes Relative: 6 %
Lymphs Abs: 0.3 10*3/uL — ABNORMAL LOW (ref 0.7–4.0)
MCH: 28.5 pg (ref 26.0–34.0)
MCHC: 34 g/dL (ref 30.0–36.0)
MCV: 83.8 fL (ref 80.0–100.0)
Monocytes Absolute: 0.5 10*3/uL (ref 0.1–1.0)
Monocytes Relative: 9 %
Neutro Abs: 4.3 10*3/uL (ref 1.7–7.7)
Neutrophils Relative %: 79 %
Platelets: 107 10*3/uL — ABNORMAL LOW (ref 150–400)
RBC: 3.58 MIL/uL — ABNORMAL LOW (ref 4.22–5.81)
RDW: 13.9 % (ref 11.5–15.5)
WBC: 5.4 10*3/uL (ref 4.0–10.5)
nRBC: 0.4 % — ABNORMAL HIGH (ref 0.0–0.2)

## 2021-06-02 LAB — COMPREHENSIVE METABOLIC PANEL
ALT: 126 U/L — ABNORMAL HIGH (ref 0–44)
AST: 93 U/L — ABNORMAL HIGH (ref 15–41)
Albumin: 2.4 g/dL — ABNORMAL LOW (ref 3.5–5.0)
Alkaline Phosphatase: 77 U/L (ref 38–126)
Anion gap: 8 (ref 5–15)
BUN: 17 mg/dL (ref 8–23)
CO2: 21 mmol/L — ABNORMAL LOW (ref 22–32)
Calcium: 7.2 mg/dL — ABNORMAL LOW (ref 8.9–10.3)
Chloride: 104 mmol/L (ref 98–111)
Creatinine, Ser: 0.72 mg/dL (ref 0.61–1.24)
GFR, Estimated: >60 mL/min (ref >60)
Glucose, Bld: 244 mg/dL — ABNORMAL HIGH (ref 70–99)
Potassium: 3.9 mmol/L (ref 3.5–5.1)
Sodium: 133 mmol/L — ABNORMAL LOW (ref 135–145)
Total Bilirubin: 0.8 mg/dL (ref 0.3–1.2)
Total Protein: 5.6 g/dL — ABNORMAL LOW (ref 6.5–8.1)

## 2021-06-02 LAB — D-DIMER, QUANTITATIVE: D-Dimer, Quant: 2.25 ug/mL-FEU — ABNORMAL HIGH (ref 0.00–0.50)

## 2021-06-02 LAB — GLUCOSE, CAPILLARY
Glucose-Capillary: 261 mg/dL — ABNORMAL HIGH (ref 70–99)
Glucose-Capillary: 313 mg/dL — ABNORMAL HIGH (ref 70–99)
Glucose-Capillary: 283 mg/dL — ABNORMAL HIGH (ref 70–99)
Glucose-Capillary: 250 mg/dL — ABNORMAL HIGH (ref 70–99)

## 2021-06-02 LAB — VANCOMYCIN, TROUGH: Vancomycin Tr: 54 ug/mL (ref 15–20)

## 2021-06-02 LAB — C-REACTIVE PROTEIN: CRP: 12.9 mg/dL — ABNORMAL HIGH (ref <1.0)

## 2021-06-02 LAB — VANCOMYCIN, PEAK: Vancomycin Pk: 31 ug/mL (ref 30–40)

## 2021-06-02 MED ORDER — ENOXAPARIN SODIUM 40 MG/0.4ML IJ SOSY
40.0000 mg | PREFILLED_SYRINGE | INTRAMUSCULAR | Status: DC
  Administered 2021-06-02 – 2021-06-07 (×6): 40 mg via SUBCUTANEOUS
  Filled 2021-06-02 (×6): qty 0.4

## 2021-06-02 MED ORDER — ATENOLOL 50 MG PO TABS
50.0000 mg | ORAL_TABLET | Freq: Every day | ORAL | Status: DC
  Administered 2021-06-02 – 2021-06-05 (×4): 50 mg via ORAL
  Filled 2021-06-02 (×4): qty 1

## 2021-06-02 NOTE — Anesthesia Postprocedure Evaluation (Signed)
Anesthesia Post Note  Patient: Aaron ROBARGE Sr.  Procedure(s) Performed: REMOVAL PORT-A-CATH     Patient location during evaluation: PACU Anesthesia Type: General Level of consciousness: awake and alert and oriented Pain management: pain level controlled Vital Signs Assessment: post-procedure vital signs reviewed and stable Respiratory status: spontaneous breathing, nonlabored ventilation and respiratory function stable Cardiovascular status: blood pressure returned to baseline Postop Assessment: no apparent nausea or vomiting Anesthetic complications: no   No notable events documented.              Marthenia Rolling

## 2021-06-02 NOTE — Progress Notes (Signed)
Inpatient Diabetes Program Recommendations  AACE/ADA: New Consensus Statement on Inpatient Glycemic Control (2015)  Target Ranges:  Prepandial:   less than 140 mg/dL      Peak postprandial:   less than 180 mg/dL (1-2 hours)      Critically ill patients:  140 - 180 mg/dL   Lab Results  Component Value Date   GLUCAP 313 (H) 06/02/2021   HGBA1C 9.1 (H) 05/13/2021    Review of Glycemic Control  Latest Reference Range & Units 06/02/21 07:48 06/02/21 12:06  Glucose-Capillary 70 - 99 mg/dL 261 (H) 313 (H)  (H): Data is abnormally high   Current orders for Inpatient glycemic control: novolog 0-9 units TID  Inpatient Diabetes Program Recommendations:    Received decadron yesterday for surgery.  CBG's elevated.  Please consider, Novolog 0-20 units TID and 0-5 units QHS  Will continue to follow while inpatient.  Thank you, Reche Dixon, MSN, RN Diabetes Coordinator Inpatient Diabetes Program [phone] (team pager from 8a-5p)

## 2021-06-02 NOTE — Progress Notes (Signed)
Patient ID: [name], male   DOB: 07-11-1955, 66 y.o.   MRN: [id number] [facility] Surgery Progress Note  1 Day Post-Op  Subjective: CC-  Sore but feeling a little better since surgery.   Objective: Vital signs in last 24 hours: Temp:  [97.2 F (36.2 C)-98.1 F (36.7 C)] 97.9 F (36.6 C) (01/26 0956) Pulse Rate:  [67-75] 72 (01/26 0956) Resp:  [19-21] 21 (01/26 0956) BP: (118-139)/(70-86) 120/73 (01/26 0956) SpO2:  [92 %-100 %] 96 % (01/26 0956) Last BM Date: 06/01/21  Intake/Output from previous day: 01/25 0701 - 01/26 0700 In: 4367.1 [P.O.:240; I.V.:3764.6; IV Piggyback:362.5] Out: 3100 [Urine:2050; Drains:1045; Blood:5] Intake/Output this shift: Total I/O In: 360 [P.O.:360] Out: -   PE: Chest: right chest previous port site clean without cellulitis >> wick removed and dry dressing applied Right axillary drain in place with purulent fluid in JP bulb  Lab Results:  Recent Labs    06/01/21 0528 06/02/21 0844  WBC 2.9* 5.4  HGB 8.8* 10.2*  HCT 25.2* 30.0*  PLT 62* 107*   BMET Recent Labs    06/01/21 0528 06/02/21 0844  NA 132* 133*  K 3.3* 3.9  CL 103 104  CO2 22 21*  GLUCOSE 173* 244*  BUN 14 17  CREATININE 0.75 0.72  CALCIUM 7.1* 7.2*   PT/INR No results for input(s): LABPROT, INR in the last 72 hours. CMP     Component Value Date/Time   NA 133 (L) 06/02/2021 0844   K 3.9 06/02/2021 0844   CL 104 06/02/2021 0844   CO2 21 (L) 06/02/2021 0844   GLUCOSE 244 (H) 06/02/2021 0844   BUN 17 06/02/2021 0844   CREATININE 0.72 06/02/2021 0844   CREATININE 0.87 05/20/2021 0913   CALCIUM 7.2 (L) 06/02/2021 0844   PROT 5.6 (L) 06/02/2021 0844   ALBUMIN 2.4 (L) 06/02/2021 0844   AST 93 (H) 06/02/2021 0844   AST 34 05/20/2021 0913   ALT 126 (H) 06/02/2021 0844   ALT 35 05/20/2021 0913   ALKPHOS 77 06/02/2021 0844   BILITOT 0.8 06/02/2021 0844   BILITOT 0.8 05/20/2021 0913   GFRNONAA >60 06/02/2021 0844   GFRNONAA >60 05/20/2021 0913    GFRAA >60 09/08/2019 1200   Lipase     Component Value Date/Time   LIPASE 21 05/29/2021 2055       Studies/Results: [place] AXILLA RIGHT  Result Date: 06/01/2021 CLINICAL DATA:  Right axillary abscess. EXAM: ULTRASOUND OF THE RIGHT AXILLA COMPARISON:  Chest CTA dated 05/29/2021. FINDINGS: Ultrasound is performed, showing an irregular fluid collection in the right axilla measuring 8.4 x 5.9 x 1.9 cm. This contains some thin internal septations. This also has some internal solid appearing echogenic components or invaginations of fat. IMPRESSION: 8.4 x 5.9 x 1.9 cm irregular, mildly complicated fluid collection in the right axilla. This is compatible with the patient's known abscess. Electronically Signed   By: Claudie Revering M.D.   On: 06/01/2021 10:13   ECHOCARDIOGRAM LIMITED  Result Date: 05/31/2021    ECHOCARDIOGRAM LIMITED REPORT   Patient Name:   AMBERS IYENGAR Sr. Date of Exam: 05/31/2021 Medical Rec #:  [id number]          Height:       72.0 in Accession #:    [id number]         Weight:       177.5 lb Date of Birth:  04-30-1956         BSA:  2.025 m Patient Age:    61 years           BP:           112/79 mmHg Patient Gender: M                  HR:           82 bpm. Exam Location:  Inpatient Procedure: Limited Echo and Cardiac Doppler Indications:    Bacteremia R78.81  History:        Patient has prior history of Echocardiogram examinations, most                 recent 05/19/2021. COPD; Risk Factors:Current Smoker,                 Hypertension, Dyslipidemia and Diabetes. COVID 19. Lymphoma.                 GERD.  Sonographer:    Darlina Sicilian RDCS Referring Phys: [place]  1. Left ventricular ejection fraction, by estimation, is 55%. The left ventricle has normal function. The left ventricle has no regional wall motion abnormalities.  2. Right ventricular systolic function is normal. The right ventricular size is normal.  3. The mitral valve is normal in structure. No  evidence of mitral valve regurgitation.  4. The aortic valve was not well visualized. Aortic valve regurgitation is not visualized.  5. Limited study for evalation of infective endocarditis. No valve lesions seen, but aortic valve and pulmonic valve note well visualized. Comparison(s): No significant change from prior study. Conclusion(s)/Recommendation(s): No evidence of valvular vegetations on this transthoracic echocardiogram. Consider a transesophageal echocardiogram to exclude infective endocarditis if clinically indicated. FINDINGS  Left Ventricle: Left ventricular ejection fraction, by estimation, is 55%. The left ventricle has normal function. The left ventricle has no regional wall motion abnormalities. Right Ventricle: The right ventricular size is normal. Right ventricular systolic function is normal. Mitral Valve: The mitral valve is normal in structure. Tricuspid Valve: The tricuspid valve is normal in structure. Tricuspid valve regurgitation is not demonstrated. No evidence of tricuspid stenosis. Aortic Valve: The aortic valve was not well visualized. Aortic valve regurgitation is not visualized. Pulmonic Valve: The pulmonic valve was not well visualized. Pulmonic valve regurgitation is not visualized. No evidence of pulmonic stenosis. Rudean Haskell MD Electronically signed by Rudean Haskell MD Signature Date/Time: 05/31/2021/3:41:42 PM    Final     Anti-infectives: Anti-infectives (From admission, onward)    Start     Dose/Rate Route Frequency Ordered Stop   05/31/21 1000  remdesivir 100 mg in sodium chloride 0.9 % 100 mL IVPB       See Hyperspace for full Linked Orders Report.   100 mg 200 mL/hr over 30 Minutes Intravenous Daily 05/30/21 0727 06/04/21 0959   05/31/21 0915  vancomycin (VANCOREADY) IVPB 1000 mg/200 mL        1,000 mg 200 mL/hr over 60 Minutes Intravenous Every 12 hours 05/31/21 0914     05/30/21 1100  vancomycin (VANCOREADY) IVPB 1500 mg/300 mL  Status:   Discontinued        1,500 mg 150 mL/hr over 120 Minutes Intravenous Every 24 hours 05/30/21 0834 05/31/21 0914   05/30/21 1000  vancomycin (VANCOREADY) IVPB 750 mg/150 mL  Status:  Discontinued        750 mg 150 mL/hr over 60 Minutes Intravenous Every 12 hours 05/30/21 0052 05/30/21 0834   05/30/21 1000  remdesivir 200 mg in  sodium chloride 0.9% 250 mL IVPB       See Hyperspace for full Linked Orders Report.   200 mg 580 mL/hr over 30 Minutes Intravenous Once 05/30/21 0727 05/30/21 2020   05/29/21 2345  famciclovir (FAMVIR) tablet 250 mg        250 mg Oral 2 times daily 05/29/21 2247     05/29/21 2200  aztreonam (AZACTAM) injection 2 g  Status:  Discontinued        2 g Intravenous Every 8 hours 05/29/21 1920 05/29/21 1935   05/29/21 2000  ceFEPIme (MAXIPIME) 2 g in sodium chloride 0.9 % 100 mL IVPB  Status:  Discontinued        2 g 200 mL/hr over 30 Minutes Intravenous Every 8 hours 05/29/21 1936 05/30/21 1423   05/29/21 1930  vancomycin (VANCOCIN) IVPB 1000 mg/200 mL premix        1,000 mg 200 mL/hr over 60 Minutes Intravenous  Once 05/29/21 1921 05/30/21 0102        Assessment/Plan MRSA bacteremia Hx right axillary excisional lymph node biopsy 04/27/21 -POD#1 Removal of infusion port, and Drainage of right axillary abscess 1/25 Dr. Harlow Asa - axillary abscess gram stain with GPC, culture pending - port culture pending and no growth thus far - I removed the wick from previous port site and placed a dry dressing - change daily and PRN saturation - Continue axillary drain and monitor output - Continue abx and follow cultures, ID following  FEN - CM diet VTE - none due to pancytopenia, plts improving 107 ID - vanc   COVID DM Large non-Hodgkin's lymphoma COPD GERD   LOS: 3 days    [place], [facility] Surgery 06/02/2021, 10:57 AM Please see Amion for pager number during day hours 7:00am-4:30pm

## 2021-06-02 NOTE — Progress Notes (Signed)
PROGRESS NOTE    Aaron EKSTEIN Sr.  WUJ:[id number] DOB: Apr 27, 1956 DOA: 05/29/2021 PCP: [name], MD    Brief Narrative:  66 y.o. male with a history of diabetes mellitus type 2, AAA s/p endovascular repair, COPD, lymphoma recently started chemotherapy. Patient presented secondary to fall and weakness and found to have evidence of SIRS with unknown infectious source. Empiric antibiotics started. Blood cultures significant for MRSA. 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 Complicated by neutropenia in setting of recent chemotherapy. Blood cultures obtained and results confirm MRSA, without source. CT abdomen/pelvis ordered unremarkable for abscess. Started empirically on Vancomycin and Cefepime and switched to Vancomycin monotherapy. ID auto-consulted. -afebrile -Port removed by general surgery 1/25   Atrial fibrillation with RVR ([place]) Likely transient and related to acute illness in addition to discontinuation of atenolol. Patient started on amiodarone drip with load. Patient converted back to sinus rhythm -Had been continued on amiodarone gtt -HR and bp stable, have resumed beta blocker and d/c'd amiodarone   Axillary tenderness, right Patient with a somewhat remote history (05/28/20) excisional lymph node biopsy, still with tenderness and mild erythema around the  site. Possible source for bacteremia -Pt now I/d of abscess 1/25   Acute urinary retention Unknown etiology at this time. Patient has required multiple I/O catheterization. Foley catheter placed on 1/23. Urine culture (1/23) with no growth, but culture obtained after antibiotics initiated. -Continue foley catheter, recommend f/u with urology after d/c   Pancytopenia ([place]) Secondary to recent chemotherapy. ANC of 800. Received G-CSF on 1/6. Platelets trending down. -repeat cbc in AM   AKI (acute kidney injury) ([place]) Baseline creatinine of about 0.8-0.9. 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 Marin Olp and is on chemotherapy. First cycle started 1/13 and he received G-CSF on 1/6 -Continue Famvir for shingles prophylaxis   COPD (chronic obstructive pulmonary disease) ([place])- (present on admission) Stable. Patient is on Servent, albuterol as an outpatient -Continue Brovana (substitute for Servent) -Duoneb prn   AAA (abdominal aortic aneurysm) without rupture- (present on admission) History of repair.   Essential hypertension- (present on admission) Patient with hypotension.  BP now stable Atenolol resumed   DM2 (diabetes mellitus, type 2) ([place]) Patient is on Janumet and glimepiride as an outpatient -Continue SSI   MRSA bacteremia Unknown source. Complicated by port-a-cath in place. ID consulted. Recent Transthoracic Echocardiogram from 1/12 -ID recommendations -Continue Vancomycin -TEE per ID   Diarrhea Possibly related to chemotherapy. C. difficile negative. -Supportive care   COVID-19 virus infection Incidental finding. Remdesivir started on 1/23. No steroids secondary to lack of hypoxia. CRP significantly elevated in setting of bacteremia. -Continue Remdesivir IV -Daily CMP, CBC, CRP   HYPERLIPIDEMIA -given  elevated LFT's, statin now on  hold    DVT prophylaxis: Lovenox Code Status: Full Family Communication: Pt in room, family at bedside  Status is: Inpatient  Remains inpatient appropriate because: Severity of illness    Consultants:  General Surgery ID Oncology  Procedures:  Removal of infusion port 1/25 I/D R axillary abscess 1/25  Antimicrobials: Anti-infectives (From admission, onward)    Start     Dose/Rate Route Frequency Ordered Stop   05/31/21 1000  remdesivir 100 mg in sodium chloride 0.9 % 100 mL IVPB       See Hyperspace for full Linked Orders Report.   100 mg 200 mL/hr over 30 Minutes Intravenous Daily 05/30/21 0727 06/04/21 0959   05/31/21 0915  vancomycin (VANCOREADY) IVPB 1000 mg/200 mL        1,000 mg 200 mL/hr over 60 Minutes Intravenous Every 12 hours 05/31/21 0914     05/30/21 1100  vancomycin (VANCOREADY) IVPB 1500 mg/300 mL  Status:  Discontinued        1,500 mg 150 mL/hr over 120 Minutes Intravenous Every 24 hours 05/30/21 0834 05/31/21 0914   05/30/21 1000  vancomycin (VANCOREADY) IVPB 750 mg/150 mL  Status:  Discontinued        750 mg 150 mL/hr over 60 Minutes Intravenous Every 12 hours 05/30/21 0052 05/30/21 0834   05/30/21 1000  remdesivir 200 mg in sodium chloride 0.9% 250 mL IVPB       See Hyperspace for full Linked Orders Report.   200 mg 580 mL/hr over 30 Minutes Intravenous Once 05/30/21 0727 05/30/21 2020   05/29/21 2345  famciclovir (FAMVIR) tablet 250 mg        250 mg Oral 2 times daily 05/29/21 2247     05/29/21 2200  aztreonam (AZACTAM) injection 2 g  Status:  Discontinued        2 g Intravenous Every 8 hours 05/29/21 1920 05/29/21 1935   05/29/21 2000  ceFEPIme (MAXIPIME) 2 g in sodium chloride 0.9 % 100 mL IVPB  Status:  Discontinued        2 g 200 mL/hr over 30 Minutes Intravenous Every 8 hours 05/29/21 1936 05/30/21 1423   05/29/21 1930  vancomycin (VANCOCIN) IVPB 1000 mg/200 mL premix        1,000 mg 200 mL/hr over 60 Minutes  Intravenous  Once 05/29/21 1921 05/30/21 0102       Subjective: Eager to go home soon. Reports feeling much better  Objective: Vitals:   06/01/21 1900 06/01/21 2123 06/02/21 0609 06/02/21 0956  BP:  118/70 139/84 120/73  Pulse:  71 67 72  Resp:  19 20 (!) 21  Temp:  97.8 F (36.6 C) 97.7 F (36.5 C) 97.9 F (36.6 C)  TempSrc:  Oral Oral Oral  SpO2: 97% 95% 97% 96%  Weight:      Height:        Intake/Output Summary (Last 24 hours) at 06/02/2021 1713 Last data filed at 06/02/2021 1618 Gross per 24 hour  Intake 3687.1 ml  Output 2305 ml  Net 1382.1 ml    Filed Weights   05/29/21 2201  Weight: 80.5 kg    Examination: General exam: Conversant, in no acute distress Respiratory system: normal chest rise, clear, no audible wheezing Cardiovascular system: regular rhythm, s1-s2 Gastrointestinal system: Nondistended, nontender, pos BS Central nervous system: No seizures, no tremors Extremities: No cyanosis, no joint deformities Skin: No rashes, no pallor Psychiatry: Affect normal // no auditory hallucinations   Data Reviewed: I have personally reviewed following labs and  imaging studies  CBC: Recent Labs  Lab 05/29/21 1538 05/30/21 0450 05/31/21 0411 06/01/21 0528 06/02/21 0844  WBC 0.3* 1.1* 1.5* 2.9* 5.4  NEUTROABS 0.1* 0.8* 1.1* 2.4 4.3  HGB 12.8* 11.5* 9.2* 8.8* 10.2*  HCT 37.9* 33.1* 26.8* 25.2* 30.0*  MCV 82.9 81.9 82.2 81.6 83.8  PLT 79* 56* 43* 62* 107*    Basic Metabolic Panel: Recent Labs  Lab 05/29/21 1538 05/30/21 0450 05/31/21 0411 06/01/21 0528 06/02/21 0844  NA 130* 132* 135 132* 133*  K 4.7 3.8 2.8* 3.3* 3.9  CL 96* 101 103 103 104  CO2 22 20* 23 22 21*  GLUCOSE 428* 304* 126* 173* 244*  BUN 15 18 18 14 17   CREATININE 1.24 1.29* 0.76 0.75 0.72  CALCIUM 8.4* 7.8* 7.2* 7.1* 7.2*  MG  --   --  1.6*  --   --     GFR: Estimated Creatinine Clearance: 98.3 mL/min (by C-G formula based on SCr of 0.72 mg/dL). Liver Function  Tests: Recent Labs  Lab 05/29/21 1538 05/30/21 0450 05/31/21 0411 06/01/21 0528 06/02/21 0844  AST 18 17 22  167* 93*  ALT 25 21 21  126* 126*  ALKPHOS 57 45 39 58 77  BILITOT 1.6* 1.3* 0.9 0.7 0.8  PROT 7.0 6.4* 5.5* 5.1* 5.6*  ALBUMIN 3.3* 2.9* 2.3* 2.2* 2.4*    Recent Labs  Lab 05/29/21 2055  LIPASE 21    No results for input(s): AMMONIA in the last 168 hours. Coagulation Profile: Recent Labs  Lab 05/29/21 1538  INR 1.2    Cardiac Enzymes: No results for input(s): CKTOTAL, CKMB, CKMBINDEX, TROPONINI in the last 168 hours. BNP (last 3 results) No results for input(s): PROBNP in the last 8760 hours. HbA1C: No results for input(s): HGBA1C in the last 72 hours. CBG: Recent Labs  Lab 06/01/21 1715 06/01/21 2113 06/02/21 0748 06/02/21 1206 06/02/21 1616  GLUCAP 213* 315* 261* 313* 283*    Lipid Profile: No results for input(s): CHOL, HDL, LDLCALC, TRIG, CHOLHDL, LDLDIRECT in the last 72 hours. Thyroid Function Tests: No results for input(s): TSH, T4TOTAL, FREET4, T3FREE, THYROIDAB in the last 72 hours. Anemia Panel: No results for input(s): VITAMINB12, FOLATE, FERRITIN, TIBC, IRON, RETICCTPCT in the last 72 hours. Sepsis Labs: Recent Labs  Lab 05/29/21 1538 05/29/21 1738 05/29/21 2320 05/30/21 0012  LATICACIDVEN 3.8* 2.8* 2.8* 2.7*     Recent Results (from the past 240 hour(s))  Culture, blood (Routine x 2)     Status: Abnormal   Collection Time: 05/29/21  3:38 PM   Specimen: BLOOD  Result Value Ref Range Status   Specimen Description   Final    BLOOD RIGHT ANTECUBITAL Performed at [facility] [address], [place], [place] [address]    Special Requests   Final    BOTTLES DRAWN AEROBIC AND ANAEROBIC Blood Culture adequate volume Performed at [facility] [address], [place], [place] [address]    Culture  Setup Time   Final    GRAM POSITIVE COCCI IN CLUSTERS AEROBIC BOTTLE ONLY CRITICAL RESULT  CALLED TO, READ BACK BY AND VERIFIED WITH: Damian Leavell [id number] AT 1559 BY CM Performed at [facility], [place] [address], [place], [place] [address]    Culture METHICILLIN RESISTANT STAPHYLOCOCCUS AUREUS (A)  Final   Report Status 06/01/2021 FINAL  Final   Organism ID, Bacteria METHICILLIN RESISTANT STAPHYLOCOCCUS AUREUS  Final      Susceptibility   Methicillin resistant staphylococcus aureus -  MIC*    CIPROFLOXACIN >=8 RESISTANT Resistant     ERYTHROMYCIN >=8 RESISTANT Resistant     GENTAMICIN <=0.5 SENSITIVE Sensitive     OXACILLIN >=4 RESISTANT Resistant     TETRACYCLINE <=1 SENSITIVE Sensitive     VANCOMYCIN 1 SENSITIVE Sensitive     TRIMETH/SULFA <=10 SENSITIVE Sensitive     CLINDAMYCIN <=0.25 SENSITIVE Sensitive     RIFAMPIN <=0.5 SENSITIVE Sensitive     Inducible Clindamycin NEGATIVE Sensitive     * METHICILLIN RESISTANT STAPHYLOCOCCUS AUREUS  Blood Culture ID Panel (Reflexed)     Status: Abnormal   Collection Time: 05/29/21  3:38 PM  Result Value Ref Range Status   Enterococcus faecalis NOT DETECTED NOT DETECTED Final   Enterococcus Faecium NOT DETECTED NOT DETECTED Final   Listeria monocytogenes NOT DETECTED NOT DETECTED Final   Staphylococcus species DETECTED (A) NOT DETECTED Final    Comment: CRITICAL RESULT CALLED TO, READ BACK BY AND VERIFIED WITH: PHARMD J GADHIA [id number] AT 1300 BY CM    Staphylococcus aureus (BCID) DETECTED (A) NOT DETECTED Final    Comment: Methicillin (oxacillin)-resistant Staphylococcus aureus (MRSA). MRSA is predictably resistant to beta-lactam antibiotics (except ceftaroline). Preferred therapy is vancomycin unless clinically contraindicated. Patient requires contact precautions if  hospitalized. CRITICAL RESULT CALLED TO, READ BACK BY AND VERIFIED WITH: PHARMD J GADHIA [phone] AT 1300 BY CM    Staphylococcus epidermidis NOT DETECTED NOT DETECTED Final   Staphylococcus lugdunensis NOT DETECTED NOT DETECTED Final   Streptococcus species  NOT DETECTED NOT DETECTED Final   Streptococcus agalactiae NOT DETECTED NOT DETECTED Final   Streptococcus pneumoniae NOT DETECTED NOT DETECTED Final   Streptococcus pyogenes NOT DETECTED NOT DETECTED Final   A.calcoaceticus-baumannii NOT DETECTED NOT DETECTED Final   Bacteroides fragilis NOT DETECTED NOT DETECTED Final   Enterobacterales NOT DETECTED NOT DETECTED Final   Enterobacter cloacae complex NOT DETECTED NOT DETECTED Final   Escherichia coli NOT DETECTED NOT DETECTED Final   Klebsiella aerogenes NOT DETECTED NOT DETECTED Final   Klebsiella oxytoca NOT DETECTED NOT DETECTED Final   Klebsiella pneumoniae NOT DETECTED NOT DETECTED Final   Proteus species NOT DETECTED NOT DETECTED Final   Salmonella species NOT DETECTED NOT DETECTED Final   Serratia marcescens NOT DETECTED NOT DETECTED Final   Haemophilus influenzae NOT DETECTED NOT DETECTED Final   Neisseria meningitidis NOT DETECTED NOT DETECTED Final   Pseudomonas aeruginosa NOT DETECTED NOT DETECTED Final   Stenotrophomonas maltophilia NOT DETECTED NOT DETECTED Final   Candida albicans NOT DETECTED NOT DETECTED Final   Candida auris NOT DETECTED NOT DETECTED Final   Candida glabrata NOT DETECTED NOT DETECTED Final   Candida krusei NOT DETECTED NOT DETECTED Final   Candida parapsilosis NOT DETECTED NOT DETECTED Final   Candida tropicalis NOT DETECTED NOT DETECTED Final   Cryptococcus neoformans/gattii NOT DETECTED NOT DETECTED Final   Meth resistant mecA/C and MREJ DETECTED (A) NOT DETECTED Final    Comment: CRITICAL RESULT CALLED TO, READ BACK BY AND VERIFIED WITH: Damian Leavell [id number] AT 1300 BY CM Performed at [facility], [address] [place], [place] [address]   Culture, blood (Routine x 2)     Status: None (Preliminary result)   Collection Time: 05/29/21  3:43 PM   Specimen: BLOOD  Result Value Ref Range Status   Specimen Description   Final    BLOOD LEFT HAND Performed at [facility] [address], [place], [place] [address]    Special Requests  Final    BOTTLES DRAWN AEROBIC AND ANAEROBIC Blood Culture adequate volume Performed at [facility] [address] [place], [place] [address]    Culture   Final    NO GROWTH 3 DAYS Performed at [facility], [place] [address], [place], [place] [address]    Report Status PENDING  Incomplete  Resp Panel by RT-PCR (Flu A&B, Covid)     Status: Abnormal   Collection Time: 05/29/21  7:19 PM   Specimen: Nasopharyngeal(NP) swabs in vial transport medium  Result Value Ref Range Status   SARS Coronavirus 2 by RT PCR POSITIVE (A) NEGATIVE Final    Comment: (NOTE) SARS-CoV-2 target nucleic acids are DETECTED.  The SARS-CoV-2 RNA is generally detectable in upper respiratory specimens during the acute phase of infection. Positive results are indicative of the presence of the identified virus, but do not rule out bacterial infection or co-infection with other pathogens not detected by the test. Clinical correlation with patient history and other diagnostic information is necessary to determine patient infection status. The expected result is Negative.  Fact Sheet for Patients: EntrepreneurPulse.com.au  Fact Sheet for Healthcare Providers: IncredibleEmployment.be  This test is not yet approved or cleared by the [place] FDA and  has been authorized for detection and/or diagnosis of SARS-CoV-2 by FDA under an Emergency Use Authorization (EUA).  This EUA will remain in effect (meaning this test can be used) for the duration of  the COVID-19 declaration under Section 564(b)(1) of the A ct, 21 U.S.C. section 360bbb-3(b)(1), unless the authorization is terminated or revoked sooner.     Influenza A by PCR NEGATIVE NEGATIVE Final   Influenza B by PCR NEGATIVE NEGATIVE Final    Comment: (NOTE) The Xpert Xpress SARS-CoV-2/FLU/RSV plus assay is intended as an aid in  the diagnosis of influenza from Nasopharyngeal swab specimens and should not be used as a sole basis for treatment. Nasal washings and aspirates are unacceptable for Xpert Xpress SARS-CoV-2/FLU/RSV testing.  Fact Sheet for Patients: EntrepreneurPulse.com.au  Fact Sheet for Healthcare Providers: IncredibleEmployment.be  This test is not yet approved or cleared by the [place] FDA and has been authorized for detection and/or diagnosis of SARS-CoV-2 by FDA under an Emergency Use Authorization (EUA). This EUA will remain in effect (meaning this test can be used) for the duration of the COVID-19 declaration under Section 564(b)(1) of the Act, 21 U.S.C. section 360bbb-3(b)(1), unless the authorization is terminated or revoked.  Performed at [facility], [place] [address], [place], [place] [address]   C Difficile Quick Screen w PCR reflex     Status: None   Collection Time: 05/30/21  8:42 AM   Specimen: STOOL  Result Value Ref Range Status   C Diff antigen NEGATIVE NEGATIVE Final   C Diff toxin NEGATIVE NEGATIVE Final   C Diff interpretation No C. difficile detected.  Final    Comment: Performed at [facility], [place] [address] [place], [place] [address]  Urine Culture     Status: None   Collection Time: 05/30/21  1:44 PM   Specimen: Urine, Catheterized  Result Value Ref Range Status   Specimen Description   Final    URINE, CATHETERIZED Performed at [facility] [address], [place], [place] [address]    Special Requests   Final    NONE Performed at [facility], [place] [address] [place],  [address]    Culture   Final  NO GROWTH Performed at [facility], [place] [address], [place], [place] [address]    Report Status 05/31/2021 FINAL  Final  Culture, blood (routine x 2)     Status: None (Preliminary result)   Collection Time: 05/31/21 11:07 PM    Specimen: BLOOD RIGHT ARM  Result Value Ref Range Status   Specimen Description   Final    BLOOD RIGHT ARM Performed at [facility] [address] [place], [place] [address]    Special Requests   Final    BOTTLES DRAWN AEROBIC ONLY Blood Culture adequate volume Performed at [facility] [address] [place], [place] [address]    Culture   Final    NO GROWTH 1 DAY Performed at [facility], [place] [address] [place], [place] [address]    Report Status PENDING  Incomplete  Culture, blood (routine x 2)     Status: None (Preliminary result)   Collection Time: 05/31/21 11:07 PM   Specimen: BLOOD RIGHT WRIST  Result Value Ref Range Status   Specimen Description   Final    BLOOD RIGHT WRIST Performed at [facility] [address], [place], [place] [address]    Special Requests   Final    BOTTLES DRAWN AEROBIC ONLY Blood Culture adequate volume Performed at [facility] [address], [place], [place] [address]    Culture   Final    NO GROWTH 1 DAY Performed at [facility], [place] [address] [place], [place] [address]    Report Status PENDING  Incomplete  Aerobic Culture w Gram Stain (superficial specimen)     Status: None (Preliminary result)   Collection Time: 06/01/21  3:20 PM   Specimen: Chest; Abscess  Result Value Ref Range Status   Specimen Description   Final    ABSCESS CHEST RIGHT PORTA CATH Performed at [facility] [address], [place], [place] [address]    Special Requests   Final    NONE Performed at [facility], [place] [address], [place], [place] [address]    Gram Stain   Final    FEW WBC PRESENT, PREDOMINANTLY MONONUCLEAR NO ORGANISMS SEEN    Culture   Final    NO GROWTH < 12 HOURS Performed at [facility] [address] [place], [place] [address]    Report Status PENDING  Incomplete  Aerobic Culture w Gram Stain  (superficial specimen)     Status: None (Preliminary result)   Collection Time: 06/01/21  3:25 PM   Specimen: Axilla, Right; Abscess  Result Value Ref Range Status   Specimen Description   Final    ABSCESS RIGHT AXILLA Performed at [facility] [address], [place], [place] [address]    Special Requests   Final    NONE Performed at [facility], [place] [address] [place], [place] [address]    Gram Stain   Final    MODERATE WBC PRESENT,BOTH PMN AND MONONUCLEAR ABUNDANT GRAM POSITIVE COCCI    Culture   Final    TOO YOUNG TO READ Performed at [facility], [place] [address], [place],  [address]    Report Status PENDING  Incomplete      Radiology Studies: [place] AXILLA RIGHT  Result Date: 06/01/2021 CLINICAL DATA:  Right axillary abscess. EXAM: ULTRASOUND OF THE RIGHT AXILLA COMPARISON:  Chest CTA dated 05/29/2021. FINDINGS: Ultrasound is performed,  showing an irregular fluid collection in the right axilla measuring 8.4 x 5.9 x 1.9 cm. This contains some thin internal septations. This also has some internal solid appearing echogenic components or invaginations of fat. IMPRESSION: 8.4 x 5.9 x 1.9 cm irregular, mildly complicated fluid collection in the right axilla. This is compatible with the patient's known abscess. Electronically Signed   By: Claudie Revering M.D.   On: 06/01/2021 10:13    Scheduled Meds:  arformoterol  15 mcg Nebulization BID   aspirin EC  81 mg Oral Q0600   atenolol  50 mg Oral Daily   Chlorhexidine Gluconate Cloth  6 each Topical Daily   DULoxetine  60 mg Oral QHS   enoxaparin (LOVENOX) injection  40 mg Subcutaneous Q24H   famciclovir  250 mg Oral BID   feeding supplement (NEPRO CARB STEADY)  237 mL Oral BID BM   insulin aspart  0-9 Units Subcutaneous TID WC   multivitamin with minerals  1 tablet Oral Daily   pantoprazole  40 mg Oral Daily   sodium chloride flush  10-40 mL Intracatheter Q12H   tamsulosin  0.4 mg  Oral QPM   Continuous Infusions:  sodium chloride 50 mL/hr at 06/02/21 1615   lactated ringers 125 mL/hr at 06/01/21 2200   remdesivir 100 mg in NS 100 mL 100 mg (06/02/21 0855)   vancomycin 1,000 mg (06/02/21 0857)     LOS: 3 days   Marylu Lund, MD [facility] Pager On Amion  If 7PM-7AM, please contact night-coverage 06/02/2021, 5:13 PM

## 2021-06-02 NOTE — Progress Notes (Addendum)
Pharmacy Antibiotic Note  Aaron Weaver. is a 66 y.o. male admitted on 05/29/2021 and found to have MRSA bacteremia. Pharmacy has been consulted for vancomycin dosing. Patient underwent removal of port and drainage of right axillary abscess yesterday. ID is following.   Plan: - Continue Vancomycin 1g IV every 12 hours  - Check Vancomycin peak/trough levels today and adjust dose accordingly, if indicated - Will continue to follow renal function, culture results, and LOT plans  Height: 6' (182.9 cm) Weight: 80.5 kg (177 lb 7.5 oz) IBW/kg (Calculated) : 77.6  Temp (24hrs), Avg:97.7 F (36.5 C), Min:97.2 F (36.2 C), Max:98.1 F (36.7 C)  Recent Labs  Lab 05/29/21 1538 05/29/21 1738 05/29/21 2320 05/30/21 0012 05/30/21 0450 05/31/21 0411 06/01/21 0528 06/02/21 0844  WBC 0.3*  --   --   --  1.1* 1.5* 2.9* 5.4  CREATININE 1.24  --   --   --  1.29* 0.76 0.75 0.72  LATICACIDVEN 3.8* 2.8* 2.8* 2.7*  --   --   --   --      Estimated Creatinine Clearance: 98.3 mL/min (by C-G formula based on SCr of 0.72 mg/dL).    Allergies  Allergen Reactions   Fexofenadine Other (See Comments)    "dries me out too much"   Hydrochlorothiazide Other (See Comments)    REACTION: cramps   Penicillins Hives   Pravastatin Sodium Other (See Comments)    REACTION: aches   Requip [Ropinirole Hcl] Other (See Comments)    Bad dreams    Antimicrobials this admission: Cefepime 1/22 >> 1/23 Vancomycin 1/22 >>  Remdesivir 1/23 >> (1/27)  Microbiology results: 1/22 BCx: 1/4 MRSA 1/22 Resp panel: COVID+, Influenza A/B- 1/23 C.diff: neg 1/23 UCx: NGF 1/24 BCx: NGTD 1/25 R axilla abscess: abundant GPC on gram stain, culture pending 1/25 abscess, right port-a-cath: NGTD  Thank you for allowing pharmacy to be a part of this patients care.  Lindell Spar, PharmD, BCPS Clinical Pharmacist 06/02/2021 10:08 AM

## 2021-06-03 DIAGNOSIS — A419 Sepsis, unspecified organism: Secondary | ICD-10-CM | POA: Diagnosis not present

## 2021-06-03 DIAGNOSIS — L0291 Cutaneous abscess, unspecified: Secondary | ICD-10-CM | POA: Diagnosis not present

## 2021-06-03 DIAGNOSIS — R7881 Bacteremia: Secondary | ICD-10-CM | POA: Diagnosis not present

## 2021-06-03 DIAGNOSIS — R6520 Severe sepsis without septic shock: Secondary | ICD-10-CM | POA: Diagnosis not present

## 2021-06-03 DIAGNOSIS — R338 Other retention of urine: Secondary | ICD-10-CM | POA: Diagnosis not present

## 2021-06-03 LAB — COMPREHENSIVE METABOLIC PANEL
ALT: 103 U/L — ABNORMAL HIGH (ref 0–44)
AST: 50 U/L — ABNORMAL HIGH (ref 15–41)
Albumin: 2.3 g/dL — ABNORMAL LOW (ref 3.5–5.0)
Alkaline Phosphatase: 72 U/L (ref 38–126)
Anion gap: 6 (ref 5–15)
BUN: 15 mg/dL (ref 8–23)
CO2: 22 mmol/L (ref 22–32)
Calcium: 7 mg/dL — ABNORMAL LOW (ref 8.9–10.3)
Chloride: 107 mmol/L (ref 98–111)
Creatinine, Ser: 0.77 mg/dL (ref 0.61–1.24)
GFR, Estimated: >60 mL/min (ref >60)
Glucose, Bld: 262 mg/dL — ABNORMAL HIGH (ref 70–99)
Potassium: 3.7 mmol/L (ref 3.5–5.1)
Sodium: 135 mmol/L (ref 135–145)
Total Bilirubin: 0.6 mg/dL (ref 0.3–1.2)
Total Protein: 5.4 g/dL — ABNORMAL LOW (ref 6.5–8.1)

## 2021-06-03 LAB — CBC WITH DIFFERENTIAL/PLATELET
Abs Immature Granulocytes: 0.88 10*3/uL — ABNORMAL HIGH (ref 0.00–0.07)
Basophils Absolute: 0.1 10*3/uL (ref 0.0–0.1)
Basophils Relative: 1 %
Eosinophils Absolute: 0 10*3/uL (ref 0.0–0.5)
Eosinophils Relative: 0 %
HCT: 30.2 % — ABNORMAL LOW (ref 39.0–52.0)
Hemoglobin: 10 g/dL — ABNORMAL LOW (ref 13.0–17.0)
Immature Granulocytes: 15 %
Lymphocytes Relative: 4 %
Lymphs Abs: 0.2 10*3/uL — ABNORMAL LOW (ref 0.7–4.0)
MCH: 28.1 pg (ref 26.0–34.0)
MCHC: 33.1 g/dL (ref 30.0–36.0)
MCV: 84.8 fL (ref 80.0–100.0)
Monocytes Absolute: 0.4 10*3/uL (ref 0.1–1.0)
Monocytes Relative: 6 %
Neutro Abs: 4.4 10*3/uL (ref 1.7–7.7)
Neutrophils Relative %: 74 %
Platelets: 127 10*3/uL — ABNORMAL LOW (ref 150–400)
RBC: 3.56 MIL/uL — ABNORMAL LOW (ref 4.22–5.81)
RDW: 14.3 % (ref 11.5–15.5)
WBC: 6 10*3/uL (ref 4.0–10.5)
nRBC: 0.7 % — ABNORMAL HIGH (ref 0.0–0.2)

## 2021-06-03 LAB — GLUCOSE, CAPILLARY
Glucose-Capillary: 255 mg/dL — ABNORMAL HIGH (ref 70–99)
Glucose-Capillary: 309 mg/dL — ABNORMAL HIGH (ref 70–99)
Glucose-Capillary: 294 mg/dL — ABNORMAL HIGH (ref 70–99)
Glucose-Capillary: 251 mg/dL — ABNORMAL HIGH (ref 70–99)

## 2021-06-03 LAB — C-REACTIVE PROTEIN: CRP: 6.2 mg/dL — ABNORMAL HIGH (ref <1.0)

## 2021-06-03 LAB — VANCOMYCIN, TROUGH: Vancomycin Tr: 18 ug/mL (ref 15–20)

## 2021-06-03 LAB — D-DIMER, QUANTITATIVE: D-Dimer, Quant: 2.34 ug/mL-FEU — ABNORMAL HIGH (ref 0.00–0.50)

## 2021-06-03 MED ORDER — INSULIN ASPART 100 UNIT/ML IJ SOLN
0.0000 [IU] | Freq: Every day | INTRAMUSCULAR | Status: DC
  Administered 2021-06-03: 3 [IU] via SUBCUTANEOUS
  Administered 2021-06-04: 2 [IU] via SUBCUTANEOUS
  Administered 2021-06-05 – 2021-06-06 (×2): 4 [IU] via SUBCUTANEOUS
  Administered 2021-06-07: 3 [IU] via SUBCUTANEOUS

## 2021-06-03 MED ORDER — INSULIN ASPART 100 UNIT/ML IJ SOLN
0.0000 [IU] | Freq: Three times a day (TID) | INTRAMUSCULAR | Status: DC
  Administered 2021-06-03 – 2021-06-04 (×3): 8 [IU] via SUBCUTANEOUS
  Administered 2021-06-04: 11 [IU] via SUBCUTANEOUS
  Administered 2021-06-05: 8 [IU] via SUBCUTANEOUS
  Administered 2021-06-05: 11 [IU] via SUBCUTANEOUS
  Administered 2021-06-05 – 2021-06-06 (×2): 15 [IU] via SUBCUTANEOUS
  Administered 2021-06-06: 11 [IU] via SUBCUTANEOUS
  Administered 2021-06-07: 8 [IU] via SUBCUTANEOUS
  Administered 2021-06-07: 11 [IU] via SUBCUTANEOUS
  Administered 2021-06-07: 15 [IU] via SUBCUTANEOUS
  Administered 2021-06-08 (×2): 8 [IU] via SUBCUTANEOUS

## 2021-06-03 MED ORDER — MELATONIN 5 MG PO TABS
5.0000 mg | ORAL_TABLET | Freq: Once | ORAL | Status: AC
  Administered 2021-06-03: 5 mg via ORAL
  Filled 2021-06-03: qty 1

## 2021-06-03 NOTE — Progress Notes (Addendum)
Inpatient Diabetes Program Recommendations  AACE/ADA: New Consensus Statement on Inpatient Glycemic Control (2015)  Target Ranges:  Prepandial:   less than 140 mg/dL      Peak postprandial:   less than 180 mg/dL (1-2 hours)      Critically ill patients:  140 - 180 mg/dL   Lab Results  Component Value Date   GLUCAP 255 (H) 06/03/2021   HGBA1C 9.1 (H) 05/13/2021    Review of Glycemic Control  Latest Reference Range & Units 06/02/21 12:06 06/02/21 16:16 06/02/21 21:40 06/03/21 07:47  Glucose-Capillary 70 - 99 mg/dL 313 (H) 283 (H) 250 (H) 255 (H)  (H): Data is abnormally high  Current orders for Inpatient glycemic control: novolog 0-9 units TID   Inpatient Diabetes Program Recommendations:     Received decadron on 06/01/20 for surgery.  CBG's elevated.  Please consider, Novolog 0-20 units TID and 0-5 units QHS   Will continue to follow while inpatient.   Thank you, Reche Dixon, MSN, RN Diabetes Coordinator Inpatient Diabetes Program [phone] (team pager from 8a-5p)

## 2021-06-03 NOTE — Progress Notes (Signed)
The Port-A-Cath is now out.  He had an abscess drained under the right axilla.  I am sure this is the source of the MRSA.  He feels well.  He has a COVID.  His white cell count is back to normal now.  His white cell count 6000.  His blood sugars are on the high side at 262.  His platelet count is also doing well and 127,000.  At this point, I think the real question now is when we can restart chemotherapy on him.  I think he is probably due for treatment next week.  Again he has a curable disease.  We really cannot delay too long.  We really need to have some kind of central line into him.  I will know if a PICC can be placed.  I probably would wait for antibiotics to be on board a bit longer before I would have a PICC placed.  I guess another question is how long he will need antibiotics for this MRSA.  He feels well.  He did have a atrial fibrillation.  He seems to be in a sinus rhythm right now.  His vital signs are all stable.  Temperature 97.9.  Pulse 65.  Blood pressure 135/93.  His lungs are clear.  Cardiac exam regular rate and rhythm.  There are no murmurs.  Abdomen is soft.  Again, he has MRSA and COVID.  He is on remdesivir.  He is on vancomycin.  I saw the sensitivities for the MRSA.  It is sensitive to Bactrim.  Again, I will know this could be given as an outpatient as an oral therapy for the MRSA.  Again ID will be able to help out with this.  I am just glad that his white cell count has come back nicely.  He does look quite good.  I know he is getting fantastic care from all the staff up [address].  Lattie Haw, MD  Philippians 4:19

## 2021-06-03 NOTE — Progress Notes (Signed)
° ° °  [facility] has been requested to perform a transesophageal echocardiogram on this patient for bacteremia.  After careful review of history and examination, the risks and benefits of transesophageal echocardiogram have been explained including risks of esophageal damage, perforation (1:10,000 risk), bleeding, pharyngeal hematoma as well as other potential complications associated with anesthesia including aspiration, arrhythmia, respiratory failure and death. Alternatives to treatment were discussed, questions were answered. Patient is willing to proceed. Scheduled for Monday afternoon with Dr. Stanford Breed. He is diabetic but currently only has SSI ordered (will be NPO after midnight Sunday in prep for procedure). Confirmed with [facility] is aware of Covid positive status.   Charlie Pitter, PA-C 06/03/2021 11:24 AM

## 2021-06-03 NOTE — Progress Notes (Signed)
PROGRESS NOTE    Aaron Weaver Sr.  KNL:[id number] DOB: Sep 25, 1955 DOA: 05/29/2021 PCP: [name], MD    Brief Narrative:  66 y.o. male with a history of diabetes mellitus type 2, AAA s/p endovascular repair, COPD, lymphoma recently started chemotherapy. Patient presented secondary to fall and weakness and found to have evidence of SIRS with unknown infectious source. Empiric antibiotics started. Blood cultures significant for MRSA. 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 Complicated by neutropenia in setting of recent chemotherapy. Blood cultures obtained and results confirm MRSA, without source. CT abdomen/pelvis ordered unremarkable for abscess. Started empirically on Vancomycin and Cefepime and switched to Vancomycin monotherapy. ID auto-consulted. -remains afebrile -Port removed by general surgery 1/25 -TEE planned for 1/30   Atrial fibrillation with RVR ([name]) Likely transient and related to acute illness in addition to discontinuation of atenolol. Patient started on amiodarone drip with load. Patient converted back to sinus rhythm -Given soft bp, was initially on amio gtt -BP improved and pt has since been transitioned off amio back to PO beta blocker -Remains rate controlled   Axillary tenderness, right Patient with a somewhat remote  history (05/28/20) excisional lymph node biopsy, still with tenderness and mild erythema around the site. Possible source for bacteremia -Pt now I/d of abscess 1/25 -Cultures of abscess thus far with abundant staph aureus   Acute urinary retention Unknown etiology at this time. Patient has required multiple I/O catheterization. Foley catheter placed on 1/23. Urine culture (1/23) with no growth, but culture obtained after antibiotics initiated. -Continue foley catheter, recommend f/u with urology after d/c   Pancytopenia ([name]) Secondary to recent chemotherapy. ANC of 800. Received G-CSF on 1/6. -Blood counts are trending up  -Recheck cbc in AM   AKI (acute kidney injury) ([name]) Baseline creatinine of about 0.8-0.9. 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 Marin Olp and is on chemotherapy. First cycle started 1/13 and he received G-CSF on 1/6 -Continue Famvir for shingles prophylaxis   COPD (chronic obstructive pulmonary disease) ([name])- (present on admission) Stable. Patient is on Servent, albuterol as an outpatient -Continue Brovana (substitute for Servent) -Duoneb prn   AAA (abdominal aortic aneurysm) without rupture- (present on admission) History of repair.   Essential hypertension- (present on admission) Patient with hypotension.  BP now stable Atenolol resumed, currently at lower dose given recent bradycardia May resume home dose in the next 24hrs if HR and BP remains stable   DM2 (diabetes mellitus, type 2) ([name]) Patient is on Janumet and glimepiride as an outpatient -Continue SSI   MRSA bacteremia Unknown source. Complicated by port-a-cath in place. ID consulted. Recent Transthoracic Echocardiogram from 1/12 -ID recommendations -Continue Vancomycin -TEE per ID   Diarrhea Possibly  related to chemotherapy. C.  difficile negative. -Supportive care   COVID-19 virus infection Incidental finding. Remdesivir started on 1/23. No steroids secondary to lack of hypoxia. CRP significantly elevated in setting of bacteremia. -Continue Remdesivir IV -Daily CMP, CBC, CRP   HYPERLIPIDEMIA -given recent elevated LFT's, statin now on hold    DVT prophylaxis: Lovenox Code Status: Full Family Communication: Pt in room, family at bedside  Status is: Inpatient  Remains inpatient appropriate because: Severity of illness    Consultants:  General Surgery ID Oncology  Procedures:  Removal of infusion port 1/25 I/D R axillary abscess 1/25  Antimicrobials: Anti-infectives (From admission, onward)    Start     Dose/Rate Route Frequency Ordered Stop   05/31/21 1000  remdesivir 100 mg in sodium chloride 0.9 % 100 mL IVPB       See Hyperspace for full Linked Orders Report.   100 mg 200 mL/hr over 30 Minutes Intravenous Daily 05/30/21 0727 06/03/21 0934   05/31/21 0915  vancomycin (VANCOREADY) IVPB 1000 mg/200 mL        1,000 mg 200 mL/hr over 60 Minutes Intravenous Every 12 hours 05/31/21 0914     05/30/21 1100  vancomycin (VANCOREADY) IVPB 1500 mg/300 mL  Status:  Discontinued        1,500 mg 150 mL/hr over 120 Minutes Intravenous Every 24 hours 05/30/21 0834 05/31/21 0914   05/30/21 1000  vancomycin (VANCOREADY) IVPB 750 mg/150 mL  Status:  Discontinued        750 mg 150 mL/hr over 60 Minutes Intravenous Every 12 hours 05/30/21 0052 05/30/21 0834   05/30/21 1000  remdesivir 200 mg in sodium chloride 0.9% 250 mL IVPB       See Hyperspace for full Linked Orders Report.   200 mg 580 mL/hr over 30 Minutes Intravenous Once 05/30/21 0727 05/30/21 2020   05/29/21 2345  famciclovir (FAMVIR) tablet 250 mg        250 mg Oral 2 times daily 05/29/21 2247     05/29/21 2200  aztreonam (AZACTAM) injection 2 g  Status:  Discontinued        2 g Intravenous Every 8 hours 05/29/21 1920 05/29/21 1935   05/29/21 2000   ceFEPIme (MAXIPIME) 2 g in sodium chloride 0.9 % 100 mL IVPB  Status:  Discontinued        2 g 200 mL/hr over 30 Minutes Intravenous Every 8 hours 05/29/21 1936 05/30/21 1423   05/29/21 1930  vancomycin (VANCOCIN) IVPB 1000 mg/200 mL premix        1,000 mg 200 mL/hr over 60 Minutes Intravenous  Once 05/29/21 1921 05/30/21 0102       Subjective: Reports feeling much better now. Eager for upcoming TEE and ultimate plans to go home  Objective: Vitals:   06/02/21 0609 06/02/21 0956 06/02/21 2002 06/02/21 2106  BP: 139/84 120/73 (!) 135/93   Pulse: 67 72 65   Resp: 20 (!) 21 20   Temp: 97.7 F (36.5 C) 97.9 F (36.6 C) 97.9 F (36.6 C)   TempSrc: Oral Oral Oral   SpO2: 97% 96% 98% 97%  Weight:      Height:        Intake/Output Summary (Last 24 hours) at 06/03/2021 1519 Last data filed at 06/03/2021 1203 Gross per 24 hour  Intake 2780.79 ml  Output 3610 ml  Net -829.21 ml    Filed Weights   05/29/21 2201  Weight: 80.5 kg    Examination: General exam: Awake, laying in  bed, in nad Respiratory system: Normal respiratory effort, no wheezing Cardiovascular system: regular rate, s1, s2 Gastrointestinal system: Soft, nondistended, positive BS Central nervous system: CN2-12 grossly intact, strength intact Extremities: Perfused, no clubbing Skin: Normal skin turgor, no notable skin lesions seen Psychiatry: Mood normal // no visual hallucinations   Data Reviewed: I have personally reviewed following labs and imaging studies  CBC: Recent Labs  Lab 05/30/21 0450 05/31/21 0411 06/01/21 0528 06/02/21 0844 06/03/21 0408  WBC 1.1* 1.5* 2.9* 5.4 6.0  NEUTROABS 0.8* 1.1* 2.4 4.3 4.4  HGB 11.5* 9.2* 8.8* 10.2* 10.0*  HCT 33.1* 26.8* 25.2* 30.0* 30.2*  MCV 81.9 82.2 81.6 83.8 84.8  PLT 56* 43* 62* 107* 127*    Basic Metabolic Panel: Recent Labs  Lab 05/30/21 0450 05/31/21 0411 06/01/21 0528 06/02/21 0844 06/03/21 0408  NA 132* 135 132* 133* 135  K 3.8 2.8* 3.3* 3.9  3.7  CL 101 103 103 104 107  CO2 20* 23 22 21* 22  GLUCOSE 304* 126* 173* 244* 262*  BUN 18 18 14 17 15   CREATININE 1.29* 0.76 0.75 0.72 0.77  CALCIUM 7.8* 7.2* 7.1* 7.2* 7.0*  MG  --  1.6*  --   --   --     GFR: Estimated Creatinine Clearance: 98.3 mL/min (by C-G formula based on SCr of 0.77 mg/dL). Liver Function Tests: Recent Labs  Lab 05/30/21 0450 05/31/21 0411 06/01/21 0528 06/02/21 0844 06/03/21 0408  AST 17 22 167* 93* 50*  ALT 21 21 126* 126* 103*  ALKPHOS 45 39 58 77 72  BILITOT 1.3* 0.9 0.7 0.8 0.6  PROT 6.4* 5.5* 5.1* 5.6* 5.4*  ALBUMIN 2.9* 2.3* 2.2* 2.4* 2.3*    Recent Labs  Lab 05/29/21 2055  LIPASE 21    No results for input(s): AMMONIA in the last 168 hours. Coagulation Profile: Recent Labs  Lab 05/29/21 1538  INR 1.2    Cardiac Enzymes: No results for input(s): CKTOTAL, CKMB, CKMBINDEX, TROPONINI in the last 168 hours. BNP (last 3 results) No results for input(s): PROBNP in the last 8760 hours. HbA1C: No results for input(s): HGBA1C in the last 72 hours. CBG: Recent Labs  Lab 06/02/21 1206 06/02/21 1616 06/02/21 2140 06/03/21 0747 06/03/21 1159  GLUCAP 313* 283* 250* 255* 309*    Lipid Profile: No results for input(s): CHOL, HDL, LDLCALC, TRIG, CHOLHDL, LDLDIRECT in the last 72 hours. Thyroid Function Tests: No results for input(s): TSH, T4TOTAL, FREET4, T3FREE, THYROIDAB in the last 72 hours. Anemia Panel: No results for input(s): VITAMINB12, FOLATE, FERRITIN, TIBC, IRON, RETICCTPCT in the last 72 hours. Sepsis Labs: Recent Labs  Lab 05/29/21 1538 05/29/21 1738 05/29/21 2320 05/30/21 0012  LATICACIDVEN 3.8* 2.8* 2.8* 2.7*     Recent Results (from the past 240 hour(s))  Culture, blood (Routine x 2)     Status: Abnormal   Collection Time: 05/29/21  3:38 PM   Specimen: BLOOD  Result Value Ref Range Status   Specimen Description   Final    BLOOD RIGHT ANTECUBITAL Performed at [facility]  [address], [place], [place] [address]    Special Requests   Final    BOTTLES DRAWN AEROBIC AND ANAEROBIC Blood Culture adequate volume Performed at [facility] [address] [place], [place] [address]    Culture  Setup Time   Final    GRAM POSITIVE COCCI IN CLUSTERS AEROBIC BOTTLE ONLY CRITICAL RESULT CALLED TO, READ BACK BY AND VERIFIED WITH: PHARMD J GADHIA  [id number] AT 1559 BY CM Performed at [facility], [place] [address], [place], [place] [address]    Culture METHICILLIN RESISTANT STAPHYLOCOCCUS AUREUS (A)  Final   Report Status 06/01/2021 FINAL  Final   Organism ID, Bacteria METHICILLIN RESISTANT STAPHYLOCOCCUS AUREUS  Final      Susceptibility   Methicillin resistant staphylococcus aureus - MIC*    CIPROFLOXACIN >=8 RESISTANT Resistant     ERYTHROMYCIN >=8 RESISTANT Resistant     GENTAMICIN <=0.5 SENSITIVE Sensitive     OXACILLIN >=4 RESISTANT Resistant     TETRACYCLINE <=1 SENSITIVE Sensitive     VANCOMYCIN 1 SENSITIVE Sensitive     TRIMETH/SULFA <=10 SENSITIVE Sensitive     CLINDAMYCIN <=0.25 SENSITIVE Sensitive     RIFAMPIN <=0.5 SENSITIVE Sensitive     Inducible Clindamycin NEGATIVE Sensitive     * METHICILLIN RESISTANT STAPHYLOCOCCUS AUREUS  Blood Culture ID Panel (Reflexed)     Status: Abnormal   Collection Time: 05/29/21  3:38 PM  Result Value Ref Range Status   Enterococcus faecalis NOT DETECTED NOT DETECTED Final   Enterococcus Faecium NOT DETECTED NOT DETECTED Final   Listeria monocytogenes NOT DETECTED NOT DETECTED Final   Staphylococcus species DETECTED (A) NOT DETECTED Final    Comment: CRITICAL RESULT CALLED TO, READ BACK BY AND VERIFIED WITH: PHARMD J GADHIA [id number] AT 1300 BY CM    Staphylococcus aureus (BCID) DETECTED (A) NOT DETECTED Final    Comment: Methicillin (oxacillin)-resistant Staphylococcus aureus (MRSA). MRSA is predictably resistant to beta-lactam antibiotics (except ceftaroline). Preferred therapy is vancomycin unless  clinically contraindicated. Patient requires contact precautions if  hospitalized. CRITICAL RESULT CALLED TO, READ BACK BY AND VERIFIED WITH: PHARMD J GADHIA [id number] AT 1300 BY CM    Staphylococcus epidermidis NOT DETECTED NOT DETECTED Final   Staphylococcus lugdunensis NOT DETECTED NOT DETECTED Final   Streptococcus species NOT DETECTED NOT DETECTED Final   Streptococcus agalactiae NOT DETECTED NOT DETECTED Final   Streptococcus pneumoniae NOT DETECTED NOT DETECTED Final   Streptococcus pyogenes NOT DETECTED NOT DETECTED Final   A.calcoaceticus-baumannii NOT DETECTED NOT DETECTED Final   Bacteroides fragilis NOT DETECTED NOT DETECTED Final   Enterobacterales NOT DETECTED NOT DETECTED Final   Enterobacter cloacae complex NOT DETECTED NOT DETECTED Final   Escherichia coli NOT DETECTED NOT DETECTED Final   Klebsiella aerogenes NOT DETECTED NOT DETECTED Final   Klebsiella oxytoca NOT DETECTED NOT DETECTED Final   Klebsiella pneumoniae NOT DETECTED NOT DETECTED Final   Proteus species NOT DETECTED NOT DETECTED Final   Salmonella species NOT DETECTED NOT DETECTED Final   Serratia marcescens NOT DETECTED NOT DETECTED Final   Haemophilus influenzae NOT DETECTED NOT DETECTED Final   Neisseria meningitidis NOT DETECTED NOT DETECTED Final   Pseudomonas aeruginosa NOT DETECTED NOT DETECTED Final   Stenotrophomonas maltophilia NOT DETECTED NOT DETECTED Final   Candida albicans NOT DETECTED NOT DETECTED Final   Candida auris NOT DETECTED NOT DETECTED Final   Candida glabrata NOT DETECTED NOT DETECTED Final   Candida krusei NOT DETECTED NOT DETECTED Final   Candida parapsilosis NOT DETECTED NOT DETECTED Final   Candida tropicalis NOT DETECTED NOT DETECTED Final   Cryptococcus neoformans/gattii NOT DETECTED NOT DETECTED Final   Meth resistant mecA/C and MREJ DETECTED (A) NOT DETECTED Final    Comment: CRITICAL RESULT CALLED TO, READ BACK BY AND VERIFIED WITH: Damian Leavell [id number] AT 1300 BY  CM Performed at [facility], [address] [place], [place] [address]   Culture, blood (  Routine x 2)     Status: None (Preliminary result)   Collection Time: 05/29/21  3:43 PM   Specimen: BLOOD  Result Value Ref Range Status   Specimen Description   Final    BLOOD LEFT HAND Performed at [facility] [address], [place], [place] [address]    Special Requests   Final    BOTTLES DRAWN AEROBIC AND ANAEROBIC Blood Culture adequate volume Performed at [facility] [address], [place], [place] [address]    Culture   Final    NO GROWTH 4 DAYS Performed at [facility], [place] [address], [place], [place] [address]    Report Status PENDING  Incomplete  Resp Panel by RT-PCR (Flu A&B, Covid)     Status: Abnormal   Collection Time: 05/29/21  7:19 PM   Specimen: Nasopharyngeal(NP) swabs in vial transport medium  Result Value Ref Range Status   SARS Coronavirus 2 by RT PCR POSITIVE (A) NEGATIVE Final    Comment: (NOTE) SARS-CoV-2 target nucleic acids are DETECTED.  The SARS-CoV-2 RNA is generally detectable in upper respiratory specimens during the acute phase of infection. Positive results are indicative of the presence of the identified virus, but do not rule out bacterial infection or co-infection with other pathogens not detected by the test. Clinical correlation with patient history and other diagnostic information is necessary to determine patient infection status. The expected result is Negative.  Fact Sheet for Patients: EntrepreneurPulse.com.au  Fact Sheet for Healthcare Providers: IncredibleEmployment.be  This test is not yet approved or cleared by the [place] FDA and  has been authorized for detection and/or diagnosis of SARS-CoV-2 by FDA under an Emergency Use Authorization (EUA).  This EUA will remain in effect (meaning this test can be used) for the duration of  the  COVID-19 declaration under Section 564(b)(1) of the A ct, 21 U.S.C. section 360bbb-3(b)(1), unless the authorization is terminated or revoked sooner.     Influenza A by PCR NEGATIVE NEGATIVE Final   Influenza B by PCR NEGATIVE NEGATIVE Final    Comment: (NOTE) The Xpert Xpress SARS-CoV-2/FLU/RSV plus assay is intended as an aid in the diagnosis of influenza from Nasopharyngeal swab specimens and should not be used as a sole basis for treatment. Nasal washings and aspirates are unacceptable for Xpert Xpress SARS-CoV-2/FLU/RSV testing.  Fact Sheet for Patients: EntrepreneurPulse.com.au  Fact Sheet for Healthcare Providers: IncredibleEmployment.be  This test is not yet approved or cleared by the [place] FDA and has been authorized for detection and/or diagnosis of SARS-CoV-2 by FDA under an Emergency Use Authorization (EUA). This EUA will remain in effect (meaning this test can be used) for the duration of the COVID-19 declaration under Section 564(b)(1) of the Act, 21 U.S.C. section 360bbb-3(b)(1), unless the authorization is terminated or revoked.  Performed at [facility], [place] [address], [place], [place] [address]   C Difficile Quick Screen w PCR reflex     Status: None   Collection Time: 05/30/21  8:42 AM   Specimen: STOOL  Result Value Ref Range Status   C Diff antigen NEGATIVE NEGATIVE Final   C Diff toxin NEGATIVE NEGATIVE Final   C Diff interpretation No C. difficile detected.  Final    Comment: Performed at [facility], [place] [address] [place], [place] [address]  Urine Culture     Status: None   Collection Time: 05/30/21  1:44 PM   Specimen: Urine, Catheterized  Result Value  Ref Range Status   Specimen Description   Final    URINE, CATHETERIZED Performed at [facility] [address], [place], [place] [address]    Special Requests   Final    NONE Performed at  [facility], [place] [address], [place], [place] [address]    Culture   Final    NO GROWTH Performed at [facility], [place] [address], [place], [place] [address]    Report Status 05/31/2021 FINAL  Final  Culture, blood (routine x 2)     Status: None (Preliminary result)   Collection Time: 05/31/21 11:07 PM   Specimen: BLOOD RIGHT ARM  Result Value Ref Range Status   Specimen Description   Final    BLOOD RIGHT ARM Performed at [facility] [address] [place], [place] [address]    Special Requests   Final    BOTTLES DRAWN AEROBIC ONLY Blood Culture adequate volume Performed at [facility] [address] [place], [place] [address]    Culture   Final    NO GROWTH 2 DAYS Performed at [facility] [address], [place], [place] [address]    Report Status PENDING  Incomplete  Culture, blood (routine x 2)     Status: None (Preliminary result)   Collection Time: 05/31/21 11:07 PM   Specimen: BLOOD RIGHT WRIST  Result Value Ref Range Status   Specimen Description   Final    BLOOD RIGHT WRIST Performed at [facility] [address] [place], [place] [address]    Special Requests   Final    BOTTLES DRAWN AEROBIC ONLY Blood Culture adequate volume Performed at [facility] [address], [place], [place] [address]    Culture   Final    NO GROWTH 2 DAYS Performed at [facility] [address] [place], [place] [address]    Report Status PENDING  Incomplete  Aerobic Culture w Gram Stain (superficial specimen)     Status: None (Preliminary result)   Collection Time: 06/01/21  3:20 PM   Specimen: Chest; Abscess  Result Value Ref Range Status   Specimen Description   Final    ABSCESS CHEST RIGHT PORTA CATH Performed at [facility] [address], [place], [place] [address]    Special Requests   Final    NONE Performed at [facility], [place] [address] [place], [place] [address]    Gram Stain   Final    FEW WBC PRESENT, PREDOMINANTLY MONONUCLEAR NO ORGANISMS SEEN    Culture   Final    RARE STAPHYLOCOCCUS AUREUS CULTURE REINCUBATED FOR BETTER GROWTH SUSCEPTIBILITIES TO FOLLOW Performed at [facility], [place] [address], [place], [place] [address]    Report Status PENDING  Incomplete  Aerobic Culture w Gram Stain (superficial specimen)     Status: None (Preliminary result)   Collection Time: 06/01/21  3:25 PM   Specimen: Axilla, Right; Abscess  Result Value Ref Range Status   Specimen Description   Final    ABSCESS RIGHT AXILLA Performed at [facility] [address] [place],  [address]    Special Requests   Final    NONE Performed at [facility], [place] [address], [place], [place] [address]    Gram Stain   Final    MODERATE WBC PRESENT,BOTH PMN AND MONONUCLEAR ABUNDANT GRAM POSITIVE COCCI  Culture   Final    ABUNDANT STAPHYLOCOCCUS AUREUS SUSCEPTIBILITIES TO FOLLOW Performed at [facility], [place] [address] [place],  [address]    Report Status PENDING  Incomplete      Radiology Studies: No results found.  Scheduled Meds:  arformoterol  15 mcg Nebulization BID   aspirin EC  81 mg Oral Q0600   atenolol  50 mg Oral Daily   Chlorhexidine Gluconate Cloth  6 each Topical Daily   DULoxetine  60 mg Oral QHS   enoxaparin (LOVENOX) injection  40 mg Subcutaneous Q24H   famciclovir  250 mg Oral BID   feeding supplement (NEPRO CARB STEADY)  237 mL Oral BID BM   insulin aspart  0-9 Units Subcutaneous TID WC   multivitamin with minerals  1 tablet Oral Daily   pantoprazole  40 mg Oral Daily   tamsulosin  0.4 mg Oral QPM   Continuous Infusions:  sodium chloride 50 mL/hr at 06/03/21 0300   lactated ringers 125 mL/hr at 06/03/21 0300   vancomycin 1,000 mg (06/03/21 0905)     LOS: 4 days   Marylu Lund, MD [facility] Pager On Amion  If 7PM-7AM, please contact night-coverage 06/03/2021, 3:19 PM

## 2021-06-03 NOTE — Progress Notes (Signed)
Patient ID: Aaron Fee Sr., male   DOB: 04-Sep-1955, 66 y.o.   MRN: [id number] [facility] Progress Note  2 Days Post-Op  Subjective: CC-  Still sore from surgery but overall doing well. JP drain output remains purulent but decreased in volume.  Objective: Vital signs in last 24 hours: Temp:  [97.9 F (36.6 C)] 97.9 F (36.6 C) (01/26 2002) Pulse Rate:  [65-72] 65 (01/26 2002) Resp:  [20-21] 20 (01/26 2002) BP: (120-135)/(73-93) 135/93 (01/26 2002) SpO2:  [96 %-98 %] 97 % (01/26 2106) Last BM Date: 06/03/21  Intake/Output from previous day: 01/26 0701 - 01/27 0700 In: 3140.8 [P.O.:360; I.V.:2173.1; IV Piggyback:607.7] Out: 2610 [Urine:2600; Drains:10] Intake/Output this shift: No intake/output data recorded.  PE: Chest: right chest previous port site clean without cellulitis or purulent drainage Right axillary drain in place with purulent fluid in JP bulb  Lab Results:  Recent Labs    06/02/21 0844 06/03/21 0408  WBC 5.4 6.0  HGB 10.2* 10.0*  HCT 30.0* 30.2*  PLT 107* 127*   BMET Recent Labs    06/02/21 0844 06/03/21 0408  NA 133* 135  K 3.9 3.7  CL 104 107  CO2 21* 22  GLUCOSE 244* 262*  BUN 17 15  CREATININE 0.72 0.77  CALCIUM 7.2* 7.0*   PT/INR No results for input(s): LABPROT, INR in the last 72 hours. CMP     Component Value Date/Time   NA 135 06/03/2021 0408   K 3.7 06/03/2021 0408   CL 107 06/03/2021 0408   CO2 22 06/03/2021 0408   GLUCOSE 262 (H) 06/03/2021 0408   BUN 15 06/03/2021 0408   CREATININE 0.77 06/03/2021 0408   CREATININE 0.87 05/20/2021 0913   CALCIUM 7.0 (L) 06/03/2021 0408   PROT 5.4 (L) 06/03/2021 0408   ALBUMIN 2.3 (L) 06/03/2021 0408   AST 50 (H) 06/03/2021 0408   AST 34 05/20/2021 0913   ALT 103 (H) 06/03/2021 0408   ALT 35 05/20/2021 0913   ALKPHOS 72 06/03/2021 0408   BILITOT 0.6 06/03/2021 0408   BILITOT 0.8 05/20/2021 0913   GFRNONAA >60 06/03/2021 0408   GFRNONAA >60 05/20/2021 0913   GFRAA  >60 09/08/2019 1200   Lipase     Component Value Date/Time   LIPASE 21 05/29/2021 2055       Studies/Results: No results found.  Anti-infectives: Anti-infectives (From admission, onward)    Start     Dose/Rate Route Frequency Ordered Stop   05/31/21 1000  remdesivir 100 mg in sodium chloride 0.9 % 100 mL IVPB       See Hyperspace for full Linked Orders Report.   100 mg 200 mL/hr over 30 Minutes Intravenous Daily 05/30/21 0727 06/04/21 0959   05/31/21 0915  vancomycin (VANCOREADY) IVPB 1000 mg/200 mL        1,000 mg 200 mL/hr over 60 Minutes Intravenous Every 12 hours 05/31/21 0914     05/30/21 1100  vancomycin (VANCOREADY) IVPB 1500 mg/300 mL  Status:  Discontinued        1,500 mg 150 mL/hr over 120 Minutes Intravenous Every 24 hours 05/30/21 0834 05/31/21 0914   05/30/21 1000  vancomycin (VANCOREADY) IVPB 750 mg/150 mL  Status:  Discontinued        750 mg 150 mL/hr over 60 Minutes Intravenous Every 12 hours 05/30/21 0052 05/30/21 0834   05/30/21 1000  remdesivir 200 mg in sodium chloride 0.9% 250 mL IVPB       See Hyperspace for full Linked  Orders Report.   200 mg 580 mL/hr over 30 Minutes Intravenous Once 05/30/21 0727 05/30/21 2020   05/29/21 2345  famciclovir (FAMVIR) tablet 250 mg        250 mg Oral 2 times daily 05/29/21 2247     05/29/21 2200  aztreonam (AZACTAM) injection 2 g  Status:  Discontinued        2 g Intravenous Every 8 hours 05/29/21 1920 05/29/21 1935   05/29/21 2000  ceFEPIme (MAXIPIME) 2 g in sodium chloride 0.9 % 100 mL IVPB  Status:  Discontinued        2 g 200 mL/hr over 30 Minutes Intravenous Every 8 hours 05/29/21 1936 05/30/21 1423   05/29/21 1930  vancomycin (VANCOCIN) IVPB 1000 mg/200 mL premix        1,000 mg 200 mL/hr over 60 Minutes Intravenous  Once 05/29/21 1921 05/30/21 0102        Assessment/Plan MRSA bacteremia Hx right axillary excisional lymph node biopsy 04/27/21 -POD#2 Removal of infusion port, and Drainage of right  axillary abscess 1/25 Dr. Harlow Asa - axillary abscess gram stain with GPC, culture pending - port culture pending and no growth thus far - Continue daily dry dressings to previous port site - Continue axillary drain and monitor output - Continue abx and follow cultures, ID following - We will see PRN over the weekend. Discharge instructions and follow up info on AVS.   FEN - CM diet VTE - lovenox ID - vanc   COVID DM Large non-Hodgkin's lymphoma COPD GERD   LOS: 4 days    Wellington Hampshire, [facility] 06/03/2021, 9:13 AM Please see Amion for pager number during day hours 7:00am-4:30pm

## 2021-06-03 NOTE — Progress Notes (Signed)
Pharmacy Antibiotic Note  Aaron Weaver. is a 66 y.o. male admitted on 05/29/2021 and found to have MRSA bacteremia. Pharmacy has been consulted for Vancomycin dosing.  The patient had a Vancomycin dose started at 0900 yesterday with a Vanc peak drawn 1106 which resulted as 31 mcg/ml. A trough was attempted 1/26 PM but was drawn after the dose had infused so was drawn prior to subsequent dose but ~5h early at 0400 and resulted as 18 mcg/ml.   It is estimated the patient's true trough is closer to ~11 mcg/ml and AUC therapeutic at 493 mcg*h/ml (goal of 400-550 mcg*h/ml). Dose remains appropriate at this time - will not make any adjustments.  Plan: - Continue Vancomycin 1g IV every 12 hours - Will continue to follow renal function, ID work-up, and LOT plans.    Height: 6' (182.9 cm) Weight: 80.5 kg (177 lb 7.5 oz) IBW/kg (Calculated) : 77.6  Temp (24hrs), Avg:97.9 F (36.6 C), Min:97.9 F (36.6 C), Max:97.9 F (36.6 C)  Recent Labs  Lab 05/29/21 1538 05/29/21 1738 05/29/21 2320 05/30/21 0012 05/30/21 0450 05/31/21 0411 06/01/21 0528 06/02/21 0844 06/02/21 1106 06/02/21 2202 06/03/21 0408  WBC 0.3*  --   --   --  1.1* 1.5* 2.9* 5.4  --   --  6.0  CREATININE 1.24  --   --   --  1.29* 0.76 0.75 0.72  --   --  0.77  LATICACIDVEN 3.8* 2.8* 2.8* 2.7*  --   --   --   --   --   --   --   VANCOTROUGH  --   --   --   --   --   --   --   --   --  54* 18  VANCOPEAK  --   --   --   --   --   --   --   --  31  --   --     Estimated Creatinine Clearance: 98.3 mL/min (by C-G formula based on SCr of 0.77 mg/dL).    Allergies  Allergen Reactions   Fexofenadine Other (See Comments)    "dries me out too much"   Hydrochlorothiazide Other (See Comments)    REACTION: cramps   Penicillins Hives   Pravastatin Sodium Other (See Comments)    REACTION: aches   Requip [Ropinirole Hcl] Other (See Comments)    Bad dreams    Antimicrobials this admission: Cefepime 1/22 >> 1/23 Remdesivir  1/23 >> (1/27) Vancomycin 1/22 >>  Dose adjustments this admission:   Microbiology results: 1/22 COVID >> positive 1/22 BCx >> 1/4 MRSA 1/23 CDiff >> neg 1/23 UCx >> neg 1/24 BCx >> ngtd 1/25 R-axilla abscess >> abundant GPC on GS  Thank you for allowing pharmacy to be a part of this patients care.  Alycia Rossetti, PharmD, BCPS Infectious Diseases Clinical Pharmacist 06/03/2021 9:00 AM   **Pharmacist phone directory can now be found on [email or web address] ([email or web address]).  Listed under [place].

## 2021-06-03 NOTE — TOC Progression Note (Signed)
Transition of Care (TOC) - Progression Note    Patient Details  Name: Aaron NAWROT Sr. MRN: [id number] Date of Birth: 08/24/1955  Transition of Care [facility]) CM/SW Contact  Purcell Mouton, RN Phone Number: 06/03/2021, 1:33 PM  Clinical Narrative:    Spoke with pt and his wife concerning IV ABX. Wife Aaron Weaver agreed with [employer]. Referral given to [facility], RN.    Expected Discharge Plan: [place] Barriers to Discharge: No Barriers Identified  Expected Discharge Plan and Services Expected Discharge Plan: [place]   Discharge Planning Services: CM Consult Post Acute Care Choice: [place] arrangements for the past 2 months: Single Family Home                                       Social Determinants of Health (SDOH) Interventions    Readmission Risk Interventions No flowsheet data found.

## 2021-06-03 NOTE — Progress Notes (Signed)
°    [facility] for Infectious Disease   Reason for visit: Follow up on bacteremia  Interval History: day 6 antibiotics WBC up to the normal range Remains afebrile Culture of abscess with Staph aureus  Physical Exam: Constitutional:  Vitals:   06/02/21 2002 06/02/21 2106  BP: (!) 135/93   Pulse: 65   Resp: 20   Temp: 97.9 F (36.6 C)   SpO2: 98% 97%   patient appears in NAD  Impression: MRSA bacteremia, s/p PAC removal, abscess debridement.   Plan: 1.  Continue vancomycin 2.  TEE next week  Dr. Candiss Norse on over the weekend Dr. Gale Journey on Monday and will follow up then

## 2021-06-04 DIAGNOSIS — R6520 Severe sepsis without septic shock: Secondary | ICD-10-CM | POA: Diagnosis not present

## 2021-06-04 DIAGNOSIS — R338 Other retention of urine: Secondary | ICD-10-CM | POA: Diagnosis not present

## 2021-06-04 DIAGNOSIS — A419 Sepsis, unspecified organism: Secondary | ICD-10-CM | POA: Diagnosis not present

## 2021-06-04 LAB — COMPREHENSIVE METABOLIC PANEL
ALT: 76 U/L — ABNORMAL HIGH (ref 0–44)
AST: 27 U/L (ref 15–41)
Albumin: 2.6 g/dL — ABNORMAL LOW (ref 3.5–5.0)
Alkaline Phosphatase: 84 U/L (ref 38–126)
Anion gap: 6 (ref 5–15)
BUN: 11 mg/dL (ref 8–23)
CO2: 25 mmol/L (ref 22–32)
Calcium: 7.5 mg/dL — ABNORMAL LOW (ref 8.9–10.3)
Chloride: 105 mmol/L (ref 98–111)
Creatinine, Ser: 0.8 mg/dL (ref 0.61–1.24)
GFR, Estimated: >60 mL/min (ref >60)
Glucose, Bld: 243 mg/dL — ABNORMAL HIGH (ref 70–99)
Potassium: 3.3 mmol/L — ABNORMAL LOW (ref 3.5–5.1)
Sodium: 136 mmol/L (ref 135–145)
Total Bilirubin: 0.7 mg/dL (ref 0.3–1.2)
Total Protein: 5.7 g/dL — ABNORMAL LOW (ref 6.5–8.1)

## 2021-06-04 LAB — CBC WITH DIFFERENTIAL/PLATELET
Abs Immature Granulocytes: 0.1 10*3/uL — ABNORMAL HIGH (ref 0.00–0.07)
Basophils Absolute: 0 10*3/uL (ref 0.0–0.1)
Basophils Relative: 0 %
Eosinophils Absolute: 0 10*3/uL (ref 0.0–0.5)
Eosinophils Relative: 0 %
HCT: 32.1 % — ABNORMAL LOW (ref 39.0–52.0)
Hemoglobin: 10.7 g/dL — ABNORMAL LOW (ref 13.0–17.0)
Lymphocytes Relative: 6 %
Lymphs Abs: 0.4 10*3/uL — ABNORMAL LOW (ref 0.7–4.0)
MCH: 28.2 pg (ref 26.0–34.0)
MCHC: 33.3 g/dL (ref 30.0–36.0)
MCV: 84.5 fL (ref 80.0–100.0)
Metamyelocytes Relative: 1 %
Monocytes Absolute: 0.3 10*3/uL (ref 0.1–1.0)
Monocytes Relative: 5 %
Myelocytes: 1 %
Neutro Abs: 5.1 10*3/uL (ref 1.7–7.7)
Neutrophils Relative %: 87 %
Platelets: 164 10*3/uL (ref 150–400)
RBC: 3.8 MIL/uL — ABNORMAL LOW (ref 4.22–5.81)
RDW: 14.2 % (ref 11.5–15.5)
WBC: 5.9 10*3/uL (ref 4.0–10.5)
nRBC: 1 % — ABNORMAL HIGH (ref 0.0–0.2)

## 2021-06-04 LAB — D-DIMER, QUANTITATIVE: D-Dimer, Quant: 2.37 ug/mL-FEU — ABNORMAL HIGH (ref 0.00–0.50)

## 2021-06-04 LAB — CULTURE, BLOOD (ROUTINE X 2)
Culture: NO GROWTH
Special Requests: ADEQUATE

## 2021-06-04 LAB — AEROBIC CULTURE W GRAM STAIN (SUPERFICIAL SPECIMEN)

## 2021-06-04 LAB — GLUCOSE, CAPILLARY
Glucose-Capillary: 266 mg/dL — ABNORMAL HIGH (ref 70–99)
Glucose-Capillary: 342 mg/dL — ABNORMAL HIGH (ref 70–99)
Glucose-Capillary: 280 mg/dL — ABNORMAL HIGH (ref 70–99)
Glucose-Capillary: 246 mg/dL — ABNORMAL HIGH (ref 70–99)

## 2021-06-04 LAB — C-REACTIVE PROTEIN: CRP: 6.3 mg/dL — ABNORMAL HIGH (ref <1.0)

## 2021-06-04 MED ORDER — MELATONIN 5 MG PO TABS
5.0000 mg | ORAL_TABLET | Freq: Every evening | ORAL | Status: DC | PRN
  Administered 2021-06-04 – 2021-06-07 (×4): 5 mg via ORAL
  Filled 2021-06-04 (×4): qty 1

## 2021-06-04 MED ORDER — POTASSIUM CHLORIDE CRYS ER 20 MEQ PO TBCR
60.0000 meq | EXTENDED_RELEASE_TABLET | Freq: Once | ORAL | Status: AC
  Administered 2021-06-04: 60 meq via ORAL
  Filled 2021-06-04: qty 3

## 2021-06-04 NOTE — Progress Notes (Signed)
PROGRESS NOTE    Aaron ALONSO Sr.  GGY:[id number] DOB: 12-01-1955 DOA: 05/29/2021 PCP: [name], MD    Brief Narrative:  65 y.o. male with a history of diabetes mellitus type 2, AAA s/p endovascular repair, COPD, lymphoma recently started chemotherapy. Patient presented secondary to fall and weakness and found to have evidence of SIRS with unknown infectious source. Empiric antibiotics started. Blood cultures significant for MRSA. 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 Complicated by neutropenia in setting of recent chemotherapy. Blood cultures obtained and results confirm MRSA, without source. CT abdomen/pelvis ordered unremarkable for abscess. Started empirically on Vancomycin and Cefepime and switched to Vancomycin monotherapy. ID auto-consulted. -remains afebrile -Port removed by general surgery 1/25, MRIS noted on cath tip -TEE planned for 1/30 -Will need PICC placed when cleared by ID   Atrial fibrillation with RVR ([place]) Likely transient and related to acute illness in addition to discontinuation of atenolol. Patient started on amiodarone drip with load. Patient converted back to sinus rhythm -Given soft bp, was initially on amio gtt -BP improved and pt has since been transitioned off amio back to PO beta blocker -Remains rate  controlled   Axillary tenderness, right Patient with a somewhat remote history (05/28/20) excisional lymph node biopsy, still with tenderness and mild erythema around the site. Possible source for bacteremia -Pt now I/d of abscess 1/25 -Cultures of abscess thus far with abundant staph aureus   Acute urinary retention Unknown etiology at this time. Patient has required multiple I/O catheterization. Foley catheter placed on 1/23. Urine culture (1/23) with no growth, but culture obtained after antibiotics initiated. -Continue foley catheter, recommend f/u with urology after d/c   Pancytopenia ([place]) Secondary to recent chemotherapy. ANC of 800. Received G-CSF on 1/6. -Blood counts are trending up  -recheck cbc in AM   AKI (acute kidney injury) ([place]) Baseline creatinine of about 0.8-0.9. 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 Marin Olp and is on chemotherapy. First cycle started 1/13 and he received G-CSF on 1/6 -Continue Famvir for shingles prophylaxis   COPD (chronic obstructive pulmonary disease) ([place])- (present on admission) Stable. Patient is on Servent, albuterol as an outpatient -Continue Brovana (substitute for Servent) -Duoneb prn   AAA (abdominal aortic aneurysm) without rupture- (present on admission) History of repair.   Essential hypertension- (present on admission) Patient with hypotension.  BP now stable Atenolol resumed, currently at lower dose given recent bradycardia May resume home dose in the next 24hrs if HR and BP remains stable   DM2 (diabetes mellitus, type 2) ([place]) Patient is on Janumet and glimepiride as an outpatient -Continue SSI   MRSA bacteremia Unknown source. Complicated by port-a-cath in place. ID consulted. Recent Transthoracic Echocardiogram  from 1/12 -ID recommendations -Continue  Vancomycin -TEE per ID   Diarrhea Possibly related to chemotherapy. C. difficile negative. -Supportive care   COVID-19 virus infection Incidental finding. Remdesivir started on 1/23. No steroids secondary to lack of hypoxia. CRP significantly elevated in setting of bacteremia. -Continue Remdesivir IV -Daily CMP, CBC, CRP   HYPERLIPIDEMIA -given recent elevated LFT's, statin now on hold    DVT prophylaxis: Lovenox Code Status: Full Family Communication: Pt in room, family at bedside  Status is: Inpatient  Remains inpatient appropriate because: Severity of illness    Consultants:  General Surgery ID Oncology  Procedures:  Removal of infusion port 1/25 I/D R axillary abscess 1/25  Antimicrobials: Anti-infectives (From admission, onward)    Start     Dose/Rate Route Frequency Ordered Stop   05/31/21 1000  remdesivir 100 mg in sodium chloride 0.9 % 100 mL IVPB       See Hyperspace for full Linked Orders Report.   100 mg 200 mL/hr over 30 Minutes Intravenous Daily 05/30/21 0727 06/03/21 2234   05/31/21 0915  vancomycin (VANCOREADY) IVPB 1000 mg/200 mL        1,000 mg 200 mL/hr over 60 Minutes Intravenous Every 12 hours 05/31/21 0914     05/30/21 1100  vancomycin (VANCOREADY) IVPB 1500 mg/300 mL  Status:  Discontinued        1,500 mg 150 mL/hr over 120 Minutes Intravenous Every 24 hours 05/30/21 0834 05/31/21 0914   05/30/21 1000  vancomycin (VANCOREADY) IVPB 750 mg/150 mL  Status:  Discontinued        750 mg 150 mL/hr over 60 Minutes Intravenous Every 12 hours 05/30/21 0052 05/30/21 0834   05/30/21 1000  remdesivir 200 mg in sodium chloride 0.9% 250 mL IVPB       See Hyperspace for full Linked Orders Report.   200 mg 580 mL/hr over 30 Minutes Intravenous Once 05/30/21 0727 05/30/21 2020   05/29/21 2345  famciclovir (FAMVIR) tablet 250 mg        250 mg Oral 2 times daily 05/29/21 2247     05/29/21 2200  aztreonam (AZACTAM) injection 2 g  Status:  Discontinued         2 g Intravenous Every 8 hours 05/29/21 1920 05/29/21 1935   05/29/21 2000  ceFEPIme (MAXIPIME) 2 g in sodium chloride 0.9 % 100 mL IVPB  Status:  Discontinued        2 g 200 mL/hr over 30 Minutes Intravenous Every 8 hours 05/29/21 1936 05/30/21 1423   05/29/21 1930  vancomycin (VANCOCIN) IVPB 1000 mg/200 mL premix        1,000 mg 200 mL/hr over 60 Minutes Intravenous  Once 05/29/21 1921 05/30/21 0102       Subjective: States feeling better. Ambulating in room. Eager to go home soon  Objective: Vitals:   06/03/21 2040 06/03/21 2056 06/04/21 0013 06/04/21 0526  BP: (!) 130/91  133/86 129/90  Pulse:   67 75  Resp: 17  20 19   Temp: 98.4 F (36.9 C)  98.9 F (37.2 C) 99 F (37.2 C)  TempSrc: Oral  Oral Oral  SpO2: 98% 98% 98% 96%  Weight:      Height:        Intake/Output Summary (Last 24 hours) at 06/04/2021 1633 Last data filed at 06/04/2021 1140 Gross per 24 hour  Intake 1107.58 ml  Output 2630 ml  Net -1522.42 ml    Filed Weights   05/29/21 2201  Weight: 80.5 kg  Examination: General exam: Conversant, in no acute distress Respiratory system: normal chest rise, clear, no audible wheezing Cardiovascular system: regular rhythm, s1-s2 Gastrointestinal system: Nondistended, nontender, pos BS Central nervous system: No seizures, no tremors Extremities: No cyanosis, no joint deformities Skin: No rashes, no pallor Psychiatry: Affect normal // no auditory hallucinations   Data Reviewed: I have personally reviewed following labs and imaging studies  CBC: Recent Labs  Lab 05/31/21 0411 06/01/21 0528 06/02/21 0844 06/03/21 0408 06/04/21 0403  WBC 1.5* 2.9* 5.4 6.0 5.9  NEUTROABS 1.1* 2.4 4.3 4.4 5.1  HGB 9.2* 8.8* 10.2* 10.0* 10.7*  HCT 26.8* 25.2* 30.0* 30.2* 32.1*  MCV 82.2 81.6 83.8 84.8 84.5  PLT 43* 62* 107* 127* 937    Basic Metabolic Panel: Recent Labs  Lab 05/31/21 0411 06/01/21 0528 06/02/21 0844 06/03/21 0408 06/04/21 0403  NA 135 132* 133*  135 136  K 2.8* 3.3* 3.9 3.7 3.3*  CL 103 103 104 107 105  CO2 23 22 21* 22 25  GLUCOSE 126* 173* 244* 262* 243*  BUN 18 14 17 15 11   CREATININE 0.76 0.75 0.72 0.77 0.80  CALCIUM 7.2* 7.1* 7.2* 7.0* 7.5*  MG 1.6*  --   --   --   --     GFR: Estimated Creatinine Clearance: 98.3 mL/min (by C-G formula based on SCr of 0.8 mg/dL). Liver Function Tests: Recent Labs  Lab 05/31/21 0411 06/01/21 0528 06/02/21 0844 06/03/21 0408 06/04/21 0403  AST 22 167* 93* 50* 27  ALT 21 126* 126* 103* 76*  ALKPHOS 39 58 77 72 84  BILITOT 0.9 0.7 0.8 0.6 0.7  PROT 5.5* 5.1* 5.6* 5.4* 5.7*  ALBUMIN 2.3* 2.2* 2.4* 2.3* 2.6*    Recent Labs  Lab 05/29/21 2055  LIPASE 21    No results for input(s): AMMONIA in the last 168 hours. Coagulation Profile: Recent Labs  Lab 05/29/21 1538  INR 1.2    Cardiac Enzymes: No results for input(s): CKTOTAL, CKMB, CKMBINDEX, TROPONINI in the last 168 hours. BNP (last 3 results) No results for input(s): PROBNP in the last 8760 hours. HbA1C: No results for input(s): HGBA1C in the last 72 hours. CBG: Recent Labs  Lab 06/03/21 1625 06/03/21 2230 06/04/21 0805 06/04/21 1133 06/04/21 1626  GLUCAP 294* 251* 266* 342* 280*    Lipid Profile: No results for input(s): CHOL, HDL, LDLCALC, TRIG, CHOLHDL, LDLDIRECT in the last 72 hours. Thyroid Function Tests: No results for input(s): TSH, T4TOTAL, FREET4, T3FREE, THYROIDAB in the last 72 hours. Anemia Panel: No results for input(s): VITAMINB12, FOLATE, FERRITIN, TIBC, IRON, RETICCTPCT in the last 72 hours. Sepsis Labs: Recent Labs  Lab 05/29/21 1538 05/29/21 1738 05/29/21 2320 05/30/21 0012  LATICACIDVEN 3.8* 2.8* 2.8* 2.7*     Recent Results (from the past 240 hour(s))  Culture, blood (Routine x 2)     Status: Abnormal   Collection Time: 05/29/21  3:38 PM   Specimen: BLOOD  Result Value Ref Range Status   Specimen Description   Final    BLOOD RIGHT ANTECUBITAL Performed at [facility] [address], [place], [place] [address]    Special Requests   Final    BOTTLES DRAWN AEROBIC AND ANAEROBIC Blood Culture adequate volume Performed at [facility] [address], [place], [place] [address]    Culture  Setup Time   Final    GRAM POSITIVE COCCI IN CLUSTERS AEROBIC BOTTLE ONLY CRITICAL RESULT CALLED TO, READ BACK BY AND VERIFIED WITH:  PHARMD Melodye Ped [id number] AT 1559 BY CM Performed at [facility], [place] [address] [place], [place] [address]    Culture METHICILLIN RESISTANT STAPHYLOCOCCUS AUREUS (A)  Final   Report Status 06/01/2021 FINAL  Final   Organism ID, Bacteria METHICILLIN RESISTANT STAPHYLOCOCCUS AUREUS  Final      Susceptibility   Methicillin resistant staphylococcus aureus - MIC*    CIPROFLOXACIN >=8 RESISTANT Resistant     ERYTHROMYCIN >=8 RESISTANT Resistant     GENTAMICIN <=0.5 SENSITIVE Sensitive     OXACILLIN >=4 RESISTANT Resistant     TETRACYCLINE <=1 SENSITIVE Sensitive     VANCOMYCIN 1 SENSITIVE Sensitive     TRIMETH/SULFA <=10 SENSITIVE Sensitive     CLINDAMYCIN <=0.25 SENSITIVE Sensitive     RIFAMPIN <=0.5 SENSITIVE Sensitive     Inducible Clindamycin NEGATIVE Sensitive     * METHICILLIN RESISTANT STAPHYLOCOCCUS AUREUS  Blood Culture ID Panel (Reflexed)     Status: Abnormal   Collection Time: 05/29/21  3:38 PM  Result Value Ref Range Status   Enterococcus faecalis NOT DETECTED NOT DETECTED Final   Enterococcus Faecium NOT DETECTED NOT DETECTED Final   Listeria monocytogenes NOT DETECTED NOT DETECTED Final   Staphylococcus species DETECTED (A) NOT DETECTED Final    Comment: CRITICAL RESULT CALLED TO, READ BACK BY AND VERIFIED WITH: PHARMD J GADHIA [id number] AT 1300 BY CM    Staphylococcus aureus (BCID) DETECTED (A) NOT DETECTED Final    Comment: Methicillin (oxacillin)-resistant Staphylococcus aureus (MRSA). MRSA is predictably resistant to beta-lactam antibiotics (except ceftaroline). Preferred  therapy is vancomycin unless clinically contraindicated. Patient requires contact precautions if  hospitalized. CRITICAL RESULT CALLED TO, READ BACK BY AND VERIFIED WITH: PHARMD J GADHIA [id number] AT 1300 BY CM    Staphylococcus epidermidis NOT DETECTED NOT DETECTED Final   Staphylococcus lugdunensis NOT DETECTED NOT DETECTED Final   Streptococcus species NOT DETECTED NOT DETECTED Final   Streptococcus agalactiae NOT DETECTED NOT DETECTED Final   Streptococcus pneumoniae NOT DETECTED NOT DETECTED Final   Streptococcus pyogenes NOT DETECTED NOT DETECTED Final   A.calcoaceticus-baumannii NOT DETECTED NOT DETECTED Final   Bacteroides fragilis NOT DETECTED NOT DETECTED Final   Enterobacterales NOT DETECTED NOT DETECTED Final   Enterobacter cloacae complex NOT DETECTED NOT DETECTED Final   Escherichia coli NOT DETECTED NOT DETECTED Final   Klebsiella aerogenes NOT DETECTED NOT DETECTED Final   Klebsiella oxytoca NOT DETECTED NOT DETECTED Final   Klebsiella pneumoniae NOT DETECTED NOT DETECTED Final   Proteus species NOT DETECTED NOT DETECTED Final   Salmonella species NOT DETECTED NOT DETECTED Final   Serratia marcescens NOT DETECTED NOT DETECTED Final   Haemophilus influenzae NOT DETECTED NOT DETECTED Final   Neisseria meningitidis NOT DETECTED NOT DETECTED Final   Pseudomonas aeruginosa NOT DETECTED NOT DETECTED Final   Stenotrophomonas maltophilia NOT DETECTED NOT DETECTED Final   Candida albicans NOT DETECTED NOT DETECTED Final   Candida auris NOT DETECTED NOT DETECTED Final   Candida glabrata NOT DETECTED NOT DETECTED Final   Candida krusei NOT DETECTED NOT DETECTED Final   Candida parapsilosis NOT DETECTED NOT DETECTED Final   Candida tropicalis NOT DETECTED NOT DETECTED Final   Cryptococcus neoformans/gattii NOT DETECTED NOT DETECTED Final   Meth resistant mecA/C and MREJ DETECTED (A) NOT DETECTED Final    Comment: CRITICAL RESULT CALLED TO, READ BACK BY AND VERIFIED WITH: Damian Leavell [id number] AT 1300 BY CM Performed at [facility], [address], [place], [place] [address]  Culture, blood (Routine x 2)     Status: None   Collection Time: 05/29/21  3:43 PM   Specimen: BLOOD  Result Value Ref Range Status   Specimen Description   Final    BLOOD LEFT HAND Performed at [facility] [address] [place], [place] [address]    Special Requests   Final    BOTTLES DRAWN AEROBIC AND ANAEROBIC Blood Culture adequate volume Performed at [facility] [address], [place], [place] [address]    Culture   Final    NO GROWTH 5 DAYS Performed at [facility], [place] [address], [place], [place] [address]    Report Status 06/04/2021 FINAL  Final  Resp Panel by RT-PCR (Flu A&B, Covid)     Status: Abnormal   Collection Time: 05/29/21  7:19 PM   Specimen: Nasopharyngeal(NP) swabs in vial transport medium  Result Value Ref Range Status   SARS Coronavirus 2 by RT PCR POSITIVE (A) NEGATIVE Final    Comment: (NOTE) SARS-CoV-2 target nucleic acids are DETECTED.  The SARS-CoV-2 RNA is generally detectable in upper respiratory specimens during the acute phase of infection. Positive results are indicative of the presence of the identified virus, but do not rule out bacterial infection or co-infection with other pathogens not detected by the test. Clinical correlation with patient history and other diagnostic information is necessary to determine patient infection status. The expected result is Negative.  Fact Sheet for Patients: EntrepreneurPulse.com.au  Fact Sheet for Healthcare Providers: IncredibleEmployment.be  This test is not yet approved or cleared by the [place] FDA and  has been authorized for detection and/or diagnosis of SARS-CoV-2 by FDA under an Emergency Use Authorization (EUA).  This EUA will remain in effect (meaning this test can be used) for the duration of   the COVID-19 declaration under Section 564(b)(1) of the A ct, 21 U.S.C. section 360bbb-3(b)(1), unless the authorization is terminated or revoked sooner.     Influenza A by PCR NEGATIVE NEGATIVE Final   Influenza B by PCR NEGATIVE NEGATIVE Final    Comment: (NOTE) The Xpert Xpress SARS-CoV-2/FLU/RSV plus assay is intended as an aid in the diagnosis of influenza from Nasopharyngeal swab specimens and should not be used as a sole basis for treatment. Nasal washings and aspirates are unacceptable for Xpert Xpress SARS-CoV-2/FLU/RSV testing.  Fact Sheet for Patients: EntrepreneurPulse.com.au  Fact Sheet for Healthcare Providers: IncredibleEmployment.be  This test is not yet approved or cleared by the [place] FDA and has been authorized for detection and/or diagnosis of SARS-CoV-2 by FDA under an Emergency Use Authorization (EUA). This EUA will remain in effect (meaning this test can be used) for the duration of the COVID-19 declaration under Section 564(b)(1) of the Act, 21 U.S.C. section 360bbb-3(b)(1), unless the authorization is terminated or revoked.  Performed at [facility], [place] [address], [place], [place] [address]   C Difficile Quick Screen w PCR reflex     Status: None   Collection Time: 05/30/21  8:42 AM   Specimen: STOOL  Result Value Ref Range Status   C Diff antigen NEGATIVE NEGATIVE Final   C Diff toxin NEGATIVE NEGATIVE Final   C Diff interpretation No C. difficile detected.  Final    Comment: Performed at [facility], [place] [address] [place], [place] [address]  Urine Culture     Status: None   Collection Time: 05/30/21  1:44 PM   Specimen: Urine, Catheterized  Result  Value Ref Range Status   Specimen Description   Final    URINE, CATHETERIZED Performed at [facility] [address], [place], [place] [address]    Special Requests   Final     NONE Performed at [facility], [place] [address], [place], [place] [address]    Culture   Final    NO GROWTH Performed at [facility], [place] [address] [place], [place] [address]    Report Status 05/31/2021 FINAL  Final  Culture, blood (routine x 2)     Status: None (Preliminary result)   Collection Time: 05/31/21 11:07 PM   Specimen: BLOOD RIGHT ARM  Result Value Ref Range Status   Specimen Description   Final    BLOOD RIGHT ARM Performed at [facility] [address], [place], [place] [address]    Special Requests   Final    BOTTLES DRAWN AEROBIC ONLY Blood Culture adequate volume Performed at [place] [address] [place], [place] [address]    Culture   Final    NO GROWTH 3 DAYS Performed at [facility], [place] [address] [place], [place] [address]    Report Status PENDING  Incomplete  Culture, blood (routine x 2)     Status: None (Preliminary result)   Collection Time: 05/31/21 11:07 PM   Specimen: BLOOD RIGHT WRIST  Result Value Ref Range Status   Specimen Description   Final    BLOOD RIGHT WRIST Performed at [facility] [address] [place], [place] [address]    Special Requests   Final    BOTTLES DRAWN AEROBIC ONLY Blood Culture adequate volume Performed at [facility] [address], [place], [place] [address]    Culture   Final    NO GROWTH 3 DAYS Performed at [facility], [place] [address] [place], [place] [address]    Report Status PENDING  Incomplete  Aerobic Culture w Gram Stain (superficial specimen)     Status: None   Collection Time: 06/01/21  3:20 PM   Specimen: Chest; Abscess  Result Value Ref Range Status   Specimen Description   Final    ABSCESS CHEST RIGHT PORTA CATH Performed at [facility] [address], [place], [place] [address]    Special Requests   Final    NONE Performed at [facility], [place] [address] [place], [place] [address]    Gram Stain   Final    FEW WBC PRESENT, PREDOMINANTLY MONONUCLEAR NO ORGANISMS SEEN Performed at [facility], [place] [address], [place], [place] [address]    Culture   Final    RARE METHICILLIN RESISTANT STAPHYLOCOCCUS AUREUS RARE STAPHYLOCOCCUS HOMINIS    Report Status 06/04/2021 FINAL  Final   Organism ID, Bacteria METHICILLIN RESISTANT STAPHYLOCOCCUS AUREUS  Final   Organism ID, Bacteria STAPHYLOCOCCUS HOMINIS  Final      Susceptibility   Methicillin resistant staphylococcus aureus - MIC*    CIPROFLOXACIN >=8 RESISTANT Resistant     ERYTHROMYCIN >=8 RESISTANT Resistant     GENTAMICIN <=0.5 SENSITIVE Sensitive     OXACILLIN >=4 RESISTANT Resistant     TETRACYCLINE <=1 SENSITIVE Sensitive     VANCOMYCIN <=0.5 SENSITIVE Sensitive     TRIMETH/SULFA <=10 SENSITIVE Sensitive     CLINDAMYCIN <=0.25 SENSITIVE Sensitive     RIFAMPIN <=0.5 SENSITIVE Sensitive     Inducible Clindamycin NEGATIVE Sensitive     *  RARE METHICILLIN RESISTANT STAPHYLOCOCCUS AUREUS   Staphylococcus hominis - MIC*    CIPROFLOXACIN <=0.5 SENSITIVE Sensitive     ERYTHROMYCIN <=0.25 SENSITIVE Sensitive     GENTAMICIN <=0.5 SENSITIVE Sensitive     OXACILLIN RESISTANT Resistant     TETRACYCLINE <=1 SENSITIVE Sensitive     VANCOMYCIN 1 SENSITIVE Sensitive     TRIMETH/SULFA <=10 SENSITIVE Sensitive     CLINDAMYCIN <=0.25 SENSITIVE Sensitive     RIFAMPIN <=0.5 SENSITIVE Sensitive     Inducible Clindamycin NEGATIVE Sensitive     * RARE STAPHYLOCOCCUS HOMINIS  Aerobic Culture w Gram Stain (superficial specimen)     Status: None   Collection Time: 06/01/21  3:25 PM   Specimen: Axilla, Right; Abscess  Result Value Ref Range Status   Specimen Description   Final    ABSCESS RIGHT AXILLA Performed at [facility] [address], [place], [place] [address]    Special Requests   Final    NONE Performed at [facility], [place] [address], [place], [place] [address]    Gram Stain   Final    MODERATE WBC PRESENT,BOTH PMN AND MONONUCLEAR ABUNDANT GRAM POSITIVE COCCI Performed at [facility], [place] [address], [place], [place] [address]    Culture   Final    ABUNDANT METHICILLIN RESISTANT STAPHYLOCOCCUS AUREUS   Report Status 06/04/2021 FINAL  Final   Organism ID, Bacteria METHICILLIN RESISTANT STAPHYLOCOCCUS AUREUS  Final      Susceptibility   Methicillin resistant staphylococcus aureus - MIC*    CIPROFLOXACIN >=8 RESISTANT Resistant     ERYTHROMYCIN >=8 RESISTANT Resistant     GENTAMICIN <=0.5 SENSITIVE Sensitive     OXACILLIN >=4 RESISTANT Resistant     TETRACYCLINE <=1 SENSITIVE Sensitive     VANCOMYCIN 1 SENSITIVE Sensitive     TRIMETH/SULFA <=10 SENSITIVE Sensitive     CLINDAMYCIN <=0.25 SENSITIVE Sensitive     RIFAMPIN <=0.5 SENSITIVE Sensitive     Inducible Clindamycin NEGATIVE Sensitive     * ABUNDANT METHICILLIN RESISTANT STAPHYLOCOCCUS AUREUS      Radiology Studies: No results found.  Scheduled Meds:  arformoterol  15 mcg Nebulization BID   aspirin EC  81 mg Oral Q0600   atenolol  50 mg Oral Daily   Chlorhexidine Gluconate Cloth  6 each Topical Daily   DULoxetine  60 mg Oral QHS   enoxaparin (LOVENOX) injection  40 mg Subcutaneous Q24H   famciclovir  250 mg Oral BID   feeding supplement (NEPRO CARB STEADY)  237 mL Oral BID BM   insulin aspart  0-15 Units Subcutaneous TID WC   insulin aspart  0-5 Units Subcutaneous QHS   multivitamin with minerals  1 tablet Oral Daily   pantoprazole  40 mg Oral Daily   tamsulosin  0.4 mg Oral QPM   Continuous Infusions:  vancomycin 1,000 mg (06/04/21 0913)     LOS: 5 days   Marylu Lund, MD [facility] Pager On Amion  If 7PM-7AM, please contact night-coverage 06/04/2021, 4:33 PM

## 2021-06-04 NOTE — Progress Notes (Signed)
Looks like the abscess into the right arm has grown staph aureus.  I am sure this is where the MRSA came from.  He is on vancomycin.  He has been afebrile.  The Port-A-Cath also had staph aureus on the tip.  He is post to go for a TEE on Monday.  He feels okay.  He has had no problems with nausea or vomiting.  His diarrhea is improving.  There is been no problems with fever.  He has had no bleeding.  He has had no cough or shortness of breath.  His white cell count is 5.9.  Hemoglobin 10.7.  Platelet count 164,000.  His blood sugar is on the high side at 243.  His ALT is up at 76.  He is post to have his second cycle of chemotherapy next week.  I suspect we will have to delay this for a week or so.  Again, we are dealing with a potentially curable disease.  I hate to have to delay too much.  He is going need to have a PICC line placed.  I think the real question is when the PICC line can be placed.  I know that Inter[facility] Radiology would not want to place it too quickly with him having the MRSA bacteremia.  His vital signs are temperature 99.  Pulse 75.  Blood pressure 129/90.  His lungs are clear bilaterally.  Cardiac exam regular rate and rhythm.  I do not hear any murmurs.  Abdomen is soft.  Bowel sounds are present.  Extremity shows no clubbing, cyanosis or edema.  Again, he has MRSA in the blood.  This is likely from the abscess in the right arm.  The Port-A-Cath tip was also growing MRSA.  Again he has non-Hodgkin's lymphoma.  We really need to see when we can do his next cycle of treatment.  I do appreciate the wonderful care that he is getting from all the staff up on [address]  Lattie Haw, MD  Genesis 18:14

## 2021-06-05 LAB — GLUCOSE, CAPILLARY
Glucose-Capillary: 295 mg/dL — ABNORMAL HIGH (ref 70–99)
Glucose-Capillary: 374 mg/dL — ABNORMAL HIGH (ref 70–99)
Glucose-Capillary: 320 mg/dL — ABNORMAL HIGH (ref 70–99)
Glucose-Capillary: 331 mg/dL — ABNORMAL HIGH (ref 70–99)

## 2021-06-05 MED ORDER — ATENOLOL 50 MG PO TABS
100.0000 mg | ORAL_TABLET | Freq: Every day | ORAL | Status: DC
  Administered 2021-06-06 – 2021-06-08 (×3): 100 mg via ORAL
  Filled 2021-06-05 (×5): qty 2

## 2021-06-05 MED ORDER — VANCOMYCIN HCL IN DEXTROSE 1-5 GM/200ML-% IV SOLN
1000.0000 mg | Freq: Two times a day (BID) | INTRAVENOUS | Status: DC
  Administered 2021-06-05 – 2021-06-08 (×7): 1000 mg via INTRAVENOUS
  Filled 2021-06-05 (×6): qty 200

## 2021-06-05 NOTE — Progress Notes (Signed)
PROGRESS NOTE    Aaron TROW Sr.  TMH:[id number] DOB: Dec 10, 1955 DOA: 05/29/2021 PCP: [name], MD    Brief Narrative:  66 y.o. male with a history of diabetes mellitus type 2, AAA s/p endovascular repair, COPD, lymphoma recently started chemotherapy. Patient presented secondary to fall and weakness and found to have evidence of SIRS with unknown infectious source. Empiric antibiotics started. Blood cultures significant for MRSA. 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 Complicated by neutropenia in setting of recent chemotherapy. Blood cultures obtained and results confirm MRSA, without source. CT abdomen/pelvis ordered unremarkable for abscess. Started empirically on Vancomycin and Cefepime and switched to Vancomycin monotherapy. ID auto-consulted. -remains afebrile -Port removed by general surgery 1/25, MRSA noted on cath tip -TEE planned for 1/30 -Will need PICC placed when cleared by ID and will need to arrange home abx   Atrial fibrillation with RVR ([place]) Likely transient and related to acute illness in addition to discontinuation of atenolol. Patient was initially started on amiodarone drip and converted back to sinus rhythm -BP improved and pt has since been transitioned off amio back to PO beta blocker -Remains rate controlled    Axillary tenderness, right Patient with a somewhat remote history (05/28/20) excisional lymph node biopsy, still with tenderness and mild erythema around the site. Possible source for bacteremia -Pt now I/d of abscess 1/25 -Cultures of abscess thus far with abundant staph aureus   Acute urinary retention Unknown etiology at this time. Patient has required multiple I/O catheterization. Foley catheter placed on 1/23. Urine culture (1/23) with no growth, but culture obtained after antibiotics initiated. -Continue foley catheter, recommend f/u with urology after d/c   Pancytopenia ([place]) Secondary to recent chemotherapy. ANC of 800. Received G-CSF on 1/6. -Blood counts are trending up  -recheck cbc in AM   AKI (acute kidney injury) ([place]) Baseline creatinine of about 0.8-0.9. 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 Marin Olp and is on chemotherapy. First cycle started 1/13 and he received G-CSF on 1/6 -Continue Famvir for shingles prophylaxis   COPD (chronic obstructive pulmonary disease) ([place])- (present on admission) Stable. Patient is on Servent, albuterol as an outpatient -Continue Brovana (substitute for Servent) -Duoneb prn   AAA (abdominal aortic aneurysm) without rupture- (present on admission) History of repair.   Essential hypertension- (present on admission) Patient with hypotension.  BP now stable Atenolol resumed, at lower dose given recent bradycardia.  BP is trending up, thus will resume home atenolol dose   DM2 (diabetes mellitus, type 2) ([place]) Patient is on Janumet and glimepiride as an outpatient -Continue SSI   MRSA bacteremia Unknown source. Complicated by port-a-cath in place. ID consulted. Recent Transthoracic Echocardiogram from 1/12 -ID recommendations -Continue  Vancomycin -TEE per ID pending for  tomorrow   Diarrhea Possibly related to chemotherapy. C. difficile negative. -Supportive care   COVID-19 virus infection Incidental finding. Remdesivir started on 1/23. No steroids secondary to lack of hypoxia. CRP significantly elevated in setting of bacteremia. -Continue Remdesivir IV -Daily CMP, CBC, CRP   HYPERLIPIDEMIA -given recent elevated LFT's, statin now on hold    DVT prophylaxis: Lovenox Code Status: Full Family Communication: Pt in room, family currently not at bedside  Status is: Inpatient  Remains inpatient appropriate because: Severity of illness    Consultants:  General Surgery ID Oncology  Procedures:  Removal of infusion port 1/25 I/D R axillary abscess 1/25  Antimicrobials: Anti-infectives (From admission, onward)    Start     Dose/Rate Route Frequency Ordered Stop   06/05/21 1000  vancomycin (VANCOCIN) IVPB 1000 mg/200 mL premix        1,000 mg 200 mL/hr over 60 Minutes Intravenous Every 12 hours 06/05/21 0908     05/31/21 1000  remdesivir 100 mg in sodium chloride 0.9 % 100 mL IVPB       See Hyperspace for full Linked Orders Report.   100 mg 200 mL/hr over 30 Minutes Intravenous Daily 05/30/21 0727 06/03/21 2234   05/31/21 0915  vancomycin (VANCOREADY) IVPB 1000 mg/200 mL  Status:  Discontinued        1,000 mg 200 mL/hr over 60 Minutes Intravenous Every 12 hours 05/31/21 0914 06/05/21 0908   05/30/21 1100  vancomycin (VANCOREADY) IVPB 1500 mg/300 mL  Status:  Discontinued        1,500 mg 150 mL/hr over 120 Minutes Intravenous Every 24 hours 05/30/21 0834 05/31/21 0914   05/30/21 1000  vancomycin (VANCOREADY) IVPB 750 mg/150 mL  Status:  Discontinued        750 mg 150 mL/hr over 60 Minutes Intravenous Every 12 hours 05/30/21 0052 05/30/21 0834   05/30/21 1000  remdesivir 200 mg in sodium chloride 0.9% 250 mL IVPB       See Hyperspace for full Linked Orders Report.   200 mg 580 mL/hr over 30 Minutes Intravenous Once 05/30/21 0727 05/30/21  2020   05/29/21 2345  famciclovir (FAMVIR) tablet 250 mg        250 mg Oral 2 times daily 05/29/21 2247     05/29/21 2200  aztreonam (AZACTAM) injection 2 g  Status:  Discontinued        2 g Intravenous Every 8 hours 05/29/21 1920 05/29/21 1935   05/29/21 2000  ceFEPIme (MAXIPIME) 2 g in sodium chloride 0.9 % 100 mL IVPB  Status:  Discontinued        2 g 200 mL/hr over 30 Minutes Intravenous Every 8 hours 05/29/21 1936 05/30/21 1423   05/29/21 1930  vancomycin (VANCOCIN) IVPB 1000 mg/200 mL premix        1,000 mg 200 mL/hr over 60 Minutes Intravenous  Once 05/29/21 1921 05/30/21 0102       Subjective: Very keen on going home soon  Objective: Vitals:   06/04/21 2127 06/05/21 0511 06/05/21 0833 06/05/21 1326  BP: (!) 152/89 (!) 142/89  (!) 146/95  Pulse: 71 73  76  Resp: 20 18  18   Temp: 98.9 F (37.2 C) 98.4 F (36.9 C)  98.4 F (36.9 C)  TempSrc: Oral Oral  Oral  SpO2: 96% 96% 96% 99%  Weight:      Height:        Intake/Output Summary (Last 24 hours) at 06/05/2021 1511 Last  data filed at 06/05/2021 1454 Gross per 24 hour  Intake 200 ml  Output 4250 ml  Net -4050 ml    Filed Weights   05/29/21 2201  Weight: 80.5 kg    Examination: General exam: Awake, laying in bed, in nad Respiratory system: Normal respiratory effort, no wheezing Cardiovascular system: regular rate, s1, s2 Gastrointestinal system: Soft, nondistended, positive BS Central nervous system: CN2-12 grossly intact, strength intact Extremities: Perfused, no clubbing Skin: Normal skin turgor, no notable skin lesions seen Psychiatry: Mood normal // no visual hallucinations   Data Reviewed: I have personally reviewed following labs and imaging studies  CBC: Recent Labs  Lab 05/31/21 0411 06/01/21 0528 06/02/21 0844 06/03/21 0408 06/04/21 0403  WBC 1.5* 2.9* 5.4 6.0 5.9  NEUTROABS 1.1* 2.4 4.3 4.4 5.1  HGB 9.2* 8.8* 10.2* 10.0* 10.7*  HCT 26.8* 25.2* 30.0* 30.2* 32.1*  MCV 82.2 81.6 83.8 84.8  84.5  PLT 43* 62* 107* 127* 127    Basic Metabolic Panel: Recent Labs  Lab 05/31/21 0411 06/01/21 0528 06/02/21 0844 06/03/21 0408 06/04/21 0403  NA 135 132* 133* 135 136  K 2.8* 3.3* 3.9 3.7 3.3*  CL 103 103 104 107 105  CO2 23 22 21* 22 25  GLUCOSE 126* 173* 244* 262* 243*  BUN 18 14 17 15 11   CREATININE 0.76 0.75 0.72 0.77 0.80  CALCIUM 7.2* 7.1* 7.2* 7.0* 7.5*  MG 1.6*  --   --   --   --     GFR: Estimated Creatinine Clearance: 98.3 mL/min (by C-G formula based on SCr of 0.8 mg/dL). Liver Function Tests: Recent Labs  Lab 05/31/21 0411 06/01/21 0528 06/02/21 0844 06/03/21 0408 06/04/21 0403  AST 22 167* 93* 50* 27  ALT 21 126* 126* 103* 76*  ALKPHOS 39 58 77 72 84  BILITOT 0.9 0.7 0.8 0.6 0.7  PROT 5.5* 5.1* 5.6* 5.4* 5.7*  ALBUMIN 2.3* 2.2* 2.4* 2.3* 2.6*    Recent Labs  Lab 05/29/21 2055  LIPASE 21    No results for input(s): AMMONIA in the last 168 hours. Coagulation Profile: Recent Labs  Lab 05/29/21 1538  INR 1.2    Cardiac Enzymes: No results for input(s): CKTOTAL, CKMB, CKMBINDEX, TROPONINI in the last 168 hours. BNP (last 3 results) No results for input(s): PROBNP in the last 8760 hours. HbA1C: No results for input(s): HGBA1C in the last 72 hours. CBG: Recent Labs  Lab 06/04/21 1133 06/04/21 1626 06/04/21 2124 06/05/21 0802 06/05/21 1137  GLUCAP 342* 280* 246* 295* 374*    Lipid Profile: No results for input(s): CHOL, HDL, LDLCALC, TRIG, CHOLHDL, LDLDIRECT in the last 72 hours. Thyroid Function Tests: No results for input(s): TSH, T4TOTAL, FREET4, T3FREE, THYROIDAB in the last 72 hours. Anemia Panel: No results for input(s): VITAMINB12, FOLATE, FERRITIN, TIBC, IRON, RETICCTPCT in the last 72 hours. Sepsis Labs: Recent Labs  Lab 05/29/21 1538 05/29/21 1738 05/29/21 2320 05/30/21 0012  LATICACIDVEN 3.8* 2.8* 2.8* 2.7*     Recent Results (from the past 240 hour(s))  Culture, blood (Routine x 2)     Status: Abnormal    Collection Time: 05/29/21  3:38 PM   Specimen: BLOOD  Result Value Ref Range Status   Specimen Description   Final    BLOOD RIGHT ANTECUBITAL Performed at [facility] [address], [place], [place] [address]    Special Requests   Final    BOTTLES DRAWN AEROBIC AND ANAEROBIC Blood Culture adequate volume Performed at [facility], [place] [address], [place], [place] [address]    Culture  Setup Time   Final    GRAM POSITIVE COCCI IN CLUSTERS AEROBIC BOTTLE ONLY CRITICAL RESULT CALLED TO, READ BACK BY AND VERIFIED WITH: Damian Leavell [id number] AT 1559 BY CM Performed at [facility], [place] [address] [place], [place] [address]    Culture METHICILLIN RESISTANT STAPHYLOCOCCUS AUREUS (A)  Final   Report Status 06/01/2021 FINAL  Final   Organism ID, Bacteria METHICILLIN RESISTANT STAPHYLOCOCCUS AUREUS  Final      Susceptibility   Methicillin resistant staphylococcus aureus - MIC*    CIPROFLOXACIN >=8 RESISTANT Resistant     ERYTHROMYCIN >=8 RESISTANT Resistant     GENTAMICIN <=0.5 SENSITIVE Sensitive     OXACILLIN >=4 RESISTANT Resistant     TETRACYCLINE <=1 SENSITIVE Sensitive     VANCOMYCIN 1 SENSITIVE Sensitive     TRIMETH/SULFA <=10 SENSITIVE Sensitive     CLINDAMYCIN <=0.25 SENSITIVE Sensitive     RIFAMPIN <=0.5 SENSITIVE Sensitive     Inducible Clindamycin NEGATIVE Sensitive     * METHICILLIN RESISTANT STAPHYLOCOCCUS AUREUS  Blood Culture ID Panel (Reflexed)     Status: Abnormal   Collection Time: 05/29/21  3:38 PM  Result Value Ref Range Status   Enterococcus faecalis NOT DETECTED NOT DETECTED Final   Enterococcus Faecium NOT DETECTED NOT DETECTED Final   Listeria monocytogenes NOT DETECTED NOT DETECTED Final   Staphylococcus species DETECTED (A) NOT DETECTED Final    Comment: CRITICAL RESULT CALLED TO, READ BACK BY AND VERIFIED WITH: PHARMD J GADHIA [id number] AT 1300 BY CM    Staphylococcus aureus (BCID) DETECTED (A) NOT DETECTED  Final    Comment: Methicillin (oxacillin)-resistant Staphylococcus aureus (MRSA). MRSA is predictably resistant to beta-lactam antibiotics (except ceftaroline). Preferred therapy is vancomycin unless clinically contraindicated. Patient requires contact precautions if  hospitalized. CRITICAL RESULT CALLED TO, READ BACK BY AND VERIFIED WITH: PHARMD J GADHIA [id number] AT 1300 BY CM    Staphylococcus epidermidis NOT DETECTED NOT DETECTED Final   Staphylococcus lugdunensis NOT DETECTED NOT DETECTED Final   Streptococcus species NOT DETECTED NOT DETECTED Final   Streptococcus agalactiae NOT DETECTED NOT DETECTED Final   Streptococcus pneumoniae NOT DETECTED NOT DETECTED Final   Streptococcus pyogenes NOT DETECTED NOT DETECTED Final   A.calcoaceticus-baumannii NOT DETECTED NOT DETECTED Final   Bacteroides fragilis NOT DETECTED NOT DETECTED Final   Enterobacterales NOT DETECTED NOT DETECTED Final   Enterobacter cloacae complex NOT DETECTED NOT DETECTED Final   Escherichia coli NOT DETECTED NOT DETECTED Final   Klebsiella aerogenes NOT DETECTED NOT DETECTED Final   Klebsiella oxytoca NOT DETECTED NOT DETECTED Final   Klebsiella pneumoniae NOT DETECTED NOT DETECTED Final   Proteus species NOT DETECTED NOT DETECTED Final   Salmonella species NOT DETECTED NOT DETECTED Final   Serratia marcescens NOT DETECTED NOT DETECTED Final   Haemophilus influenzae NOT DETECTED NOT DETECTED Final   Neisseria meningitidis NOT DETECTED NOT DETECTED Final   Pseudomonas aeruginosa NOT DETECTED NOT DETECTED Final   Stenotrophomonas maltophilia NOT DETECTED NOT DETECTED Final   Candida albicans NOT DETECTED NOT DETECTED Final   Candida auris NOT DETECTED NOT DETECTED Final   Candida glabrata NOT DETECTED NOT DETECTED Final   Candida krusei NOT DETECTED NOT DETECTED Final   Candida parapsilosis NOT DETECTED NOT DETECTED Final   Candida tropicalis NOT DETECTED NOT DETECTED Final   Cryptococcus neoformans/gattii NOT  DETECTED NOT DETECTED Final   Meth resistant mecA/C and MREJ DETECTED (  A) NOT DETECTED Final    Comment: CRITICAL RESULT CALLED TO, READ BACK BY AND VERIFIED WITH: Damian Leavell [id number] AT 1300 BY CM Performed at [facility], [place] [address], [place], [place] [address]   Culture, blood (Routine x 2)     Status: None   Collection Time: 05/29/21  3:43 PM   Specimen: BLOOD  Result Value Ref Range Status   Specimen Description   Final    BLOOD LEFT HAND Performed at [facility] [address] [place], [place] [address]    Special Requests   Final    BOTTLES DRAWN AEROBIC AND ANAEROBIC Blood Culture adequate volume Performed at [facility] [address], [place], [place] [address]    Culture   Final    NO GROWTH 5 DAYS Performed at [facility], [place] [address] [place], [place] [address]    Report Status 06/04/2021 FINAL  Final  Resp Panel by RT-PCR (Flu A&B, Covid)     Status: Abnormal   Collection Time: 05/29/21  7:19 PM   Specimen: Nasopharyngeal(NP) swabs in vial transport medium  Result Value Ref Range Status   SARS Coronavirus 2 by RT PCR POSITIVE (A) NEGATIVE Final    Comment: (NOTE) SARS-CoV-2 target nucleic acids are DETECTED.  The SARS-CoV-2 RNA is generally detectable in upper respiratory specimens during the acute phase of infection. Positive results are indicative of the presence of the identified virus, but do not rule out bacterial infection or co-infection with other pathogens not detected by the test. Clinical correlation with patient history and other diagnostic information is necessary to determine patient infection status. The expected result is Negative.  Fact Sheet for Patients: EntrepreneurPulse.com.au  Fact Sheet for Healthcare Providers: IncredibleEmployment.be  This test is not yet approved or cleared by the [place] FDA and  has been authorized for  detection and/or diagnosis of SARS-CoV-2 by FDA under an Emergency Use Authorization (EUA).  This EUA will remain in effect (meaning this test can be used) for the duration of  the COVID-19 declaration under Section 564(b)(1) of the A ct, 21 U.S.C. section 360bbb-3(b)(1), unless the authorization is terminated or revoked sooner.     Influenza A by PCR NEGATIVE NEGATIVE Final   Influenza B by PCR NEGATIVE NEGATIVE Final    Comment: (NOTE) The Xpert Xpress SARS-CoV-2/FLU/RSV plus assay is intended as an aid in the diagnosis of influenza from Nasopharyngeal swab specimens and should not be used as a sole basis for treatment. Nasal washings and aspirates are unacceptable for Xpert Xpress SARS-CoV-2/FLU/RSV testing.  Fact Sheet for Patients: EntrepreneurPulse.com.au  Fact Sheet for Healthcare Providers: IncredibleEmployment.be  This test is not yet approved or cleared by the [place] FDA and has been authorized for detection and/or diagnosis of SARS-CoV-2 by FDA under an Emergency Use Authorization (EUA). This EUA will remain in effect (meaning this test can be used) for the duration of the COVID-19 declaration under Section 564(b)(1) of the Act, 21 U.S.C. section 360bbb-3(b)(1), unless the authorization is terminated or revoked.  Performed at [facility], [place] [address] [place], [place] [address]   C Difficile Quick Screen w PCR reflex     Status: None   Collection Time: 05/30/21  8:42 AM   Specimen: STOOL  Result Value Ref Range Status   C Diff antigen NEGATIVE NEGATIVE Final   C Diff toxin NEGATIVE NEGATIVE Final   C Diff interpretation No C. difficile detected.  Final  Comment: Performed at [facility], [place] [address], [place], [place] [address]  Urine Culture     Status: None   Collection Time: 05/30/21  1:44 PM   Specimen: Urine, Catheterized  Result Value Ref Range Status   Specimen  Description   Final    URINE, CATHETERIZED Performed at [facility] [address], [place], [place] [address]    Special Requests   Final    NONE Performed at [facility], [place] [address], [place], [place] [address]    Culture   Final    NO GROWTH Performed at [facility], [place] [address], [place], [place] [address]    Report Status 05/31/2021 FINAL  Final  Culture, blood (routine x 2)     Status: None (Preliminary result)   Collection Time: 05/31/21 11:07 PM   Specimen: BLOOD RIGHT ARM  Result Value Ref Range Status   Specimen Description   Final    BLOOD RIGHT ARM Performed at [facility] [address] [place], [place] [address]    Special Requests   Final    BOTTLES DRAWN AEROBIC ONLY Blood Culture adequate volume Performed at [facility] [address], [place], [place] [address]    Culture   Final    NO GROWTH 4 DAYS Performed at [facility], [place] [address] [place], [place] [address]    Report Status PENDING  Incomplete  Culture, blood (routine x 2)     Status: None (Preliminary result)   Collection Time: 05/31/21 11:07 PM   Specimen: BLOOD RIGHT WRIST  Result Value Ref Range Status   Specimen Description   Final    BLOOD RIGHT WRIST Performed at [facility] [address], [place], [place] [address]    Special Requests   Final    BOTTLES DRAWN AEROBIC ONLY Blood Culture adequate volume Performed at [facility] [address] [place], [place] [address]    Culture   Final    NO GROWTH 4 DAYS Performed at [facility], [place] [address] [place], [place] [address]    Report Status PENDING  Incomplete  Aerobic Culture w Gram Stain (superficial specimen)     Status: None   Collection Time: 06/01/21  3:20 PM   Specimen: Chest; Abscess  Result Value Ref Range Status   Specimen Description   Final    ABSCESS CHEST RIGHT  PORTA CATH Performed at [facility] [address] [place], [place] [address]    Special Requests   Final    NONE Performed at [facility], [place] [address], [place], [place] [address]    Gram Stain   Final    FEW WBC PRESENT, PREDOMINANTLY MONONUCLEAR NO ORGANISMS SEEN Performed at [facility], [place] [address], [place], [place] [address]    Culture   Final    RARE METHICILLIN RESISTANT STAPHYLOCOCCUS AUREUS RARE STAPHYLOCOCCUS HOMINIS    Report Status 06/04/2021 FINAL  Final   Organism ID, Bacteria METHICILLIN RESISTANT STAPHYLOCOCCUS AUREUS  Final   Organism ID, Bacteria STAPHYLOCOCCUS HOMINIS  Final      Susceptibility   Methicillin resistant staphylococcus aureus - MIC*    CIPROFLOXACIN >=8 RESISTANT Resistant     ERYTHROMYCIN >=8 RESISTANT Resistant     GENTAMICIN <=0.5 SENSITIVE Sensitive     OXACILLIN >=4 RESISTANT Resistant     TETRACYCLINE <=1 SENSITIVE Sensitive  VANCOMYCIN <=0.5 SENSITIVE Sensitive     TRIMETH/SULFA <=10 SENSITIVE Sensitive     CLINDAMYCIN <=0.25 SENSITIVE Sensitive     RIFAMPIN <=0.5 SENSITIVE Sensitive     Inducible Clindamycin NEGATIVE Sensitive     * RARE METHICILLIN RESISTANT STAPHYLOCOCCUS AUREUS   Staphylococcus hominis - MIC*    CIPROFLOXACIN <=0.5 SENSITIVE Sensitive     ERYTHROMYCIN <=0.25 SENSITIVE Sensitive     GENTAMICIN <=0.5 SENSITIVE Sensitive     OXACILLIN RESISTANT Resistant     TETRACYCLINE <=1 SENSITIVE Sensitive     VANCOMYCIN 1 SENSITIVE Sensitive     TRIMETH/SULFA <=10 SENSITIVE Sensitive     CLINDAMYCIN <=0.25 SENSITIVE Sensitive     RIFAMPIN <=0.5 SENSITIVE Sensitive     Inducible Clindamycin NEGATIVE Sensitive     * RARE STAPHYLOCOCCUS HOMINIS  Aerobic Culture w Gram Stain (superficial specimen)     Status: None   Collection Time: 06/01/21  3:25 PM   Specimen: Axilla, Right; Abscess  Result Value Ref Range Status   Specimen Description   Final    ABSCESS RIGHT  AXILLA Performed at [facility] [address], [place], [place] [address]    Special Requests   Final    NONE Performed at [facility], [place] [address], [place], [place] [address]    Gram Stain   Final    MODERATE WBC PRESENT,BOTH PMN AND MONONUCLEAR ABUNDANT GRAM POSITIVE COCCI Performed at [facility], [place] [address] [place],  [address]    Culture   Final    ABUNDANT METHICILLIN RESISTANT STAPHYLOCOCCUS AUREUS   Report Status 06/04/2021 FINAL  Final   Organism ID, Bacteria METHICILLIN RESISTANT STAPHYLOCOCCUS AUREUS  Final      Susceptibility   Methicillin resistant staphylococcus aureus - MIC*    CIPROFLOXACIN >=8 RESISTANT Resistant     ERYTHROMYCIN >=8 RESISTANT Resistant     GENTAMICIN <=0.5 SENSITIVE Sensitive     OXACILLIN >=4 RESISTANT Resistant     TETRACYCLINE <=1 SENSITIVE Sensitive     VANCOMYCIN 1 SENSITIVE Sensitive     TRIMETH/SULFA <=10 SENSITIVE Sensitive     CLINDAMYCIN <=0.25 SENSITIVE Sensitive     RIFAMPIN <=0.5 SENSITIVE Sensitive     Inducible Clindamycin NEGATIVE Sensitive     * ABUNDANT METHICILLIN RESISTANT STAPHYLOCOCCUS AUREUS      Radiology Studies: No results found.  Scheduled Meds:  arformoterol  15 mcg Nebulization BID   aspirin EC  81 mg Oral Q0600   atenolol  50 mg Oral Daily   Chlorhexidine Gluconate Cloth  6 each Topical Daily   DULoxetine  60 mg Oral QHS   enoxaparin (LOVENOX) injection  40 mg Subcutaneous Q24H   famciclovir  250 mg Oral BID   feeding supplement (NEPRO CARB STEADY)  237 mL Oral BID BM   insulin aspart  0-15 Units Subcutaneous TID WC   insulin aspart  0-5 Units Subcutaneous QHS   multivitamin with minerals  1 tablet Oral Daily   pantoprazole  40 mg Oral Daily   tamsulosin  0.4 mg Oral QPM   Continuous Infusions:  vancomycin 1,000 mg (06/05/21 0937)     LOS: 6 days   Marylu Lund, MD [facility] Pager On Amion  If 7PM-7AM, please contact  night-coverage 06/05/2021, 3:11 PM

## 2021-06-05 NOTE — Progress Notes (Signed)
TEE scheduled for 06/06/2021 at [facility] at 1:15 p.m, patient aware, consent form signed and placed in packet for transport.  Patient verbalizes understanding he is nothing by mouth after midnight for procedure.  Verified with [facility] that he is on the list for transport to procedure.  Packet placed at desk.

## 2021-06-05 NOTE — H&P (View-Only) (Signed)
PROGRESS NOTE    [name] Sr.  DGL:[id number] DOB: [birth year] DOA: 05/29/2021 PCP: [name], Aaron    Brief Narrative:  66 y.o. male with a history of diabetes mellitus type Weaver, Aaron Weaver, Aaron Weaver, Aaron Weaver. Empiric antibiotics started. Blood cultures significant for MRSA. 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 Complicated by neutropenia in setting of recent chemotherapy. Blood cultures obtained and results confirm MRSA, without Weaver. CT abdomen/pelvis ordered unremarkable for abscess. Started empirically on Vancomycin and Cefepime and switched to Vancomycin monotherapy. ID auto-consulted. -remains afebrile -Port removed by general surgery 1/25, MRSA noted on cath tip -TEE planned for 1/30 -Will need PICC placed when cleared by ID and will need to arrange home abx   Atrial fibrillation with RVR ([place]) Likely transient and related to acute illness in addition to discontinuation of atenolol. Patient was initially started on amiodarone drip and converted back to sinus rhythm -BP improved and pt has since been transitioned off amio back to PO beta blocker -Remains rate controlled    Axillary tenderness, right Patient with a somewhat remote history (05/28/20) excisional lymph node biopsy, still with tenderness and mild erythema around the site. Possible Weaver for bacteremia -Pt now I/d of abscess 1/25 -Cultures of abscess thus far with abundant staph aureus   Acute urinary retention Unknown etiology at this time. Patient has required multiple I/O catheterization. Foley catheter placed on 1/23. Urine culture (1/23) with no growth, but culture obtained after antibiotics initiated. -Continue foley catheter, recommend f/u with urology after d/c   Pancytopenia ([place]) Secondary to recent chemotherapy. ANC of 800. Received G-CSF on 1/6. -Blood counts are trending up  -recheck cbc in AM   AKI (acute kidney injury) ([place]) Baseline creatinine of about 0.8-0.9. 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 Marin Olp and is on chemotherapy. First cycle started 1/13 and he received G-CSF on 1/6 -Continue Famvir for shingles prophylaxis   Aaron Weaver (chronic obstructive pulmonary disease) ([place])- (present on admission) Stable. Patient is on Servent, albuterol as an outpatient -Continue Brovana (substitute for Servent) -Duoneb prn   Aaron (abdominal aortic aneurysm) without rupture- (present on admission) History of Weaver.   Essential hypertension- (present on admission) Patient with hypotension.  BP now stable Atenolol resumed, at lower dose given recent bradycardia.  BP is trending up, thus will resume home atenolol dose   DM2 (diabetes mellitus, type Weaver) ([place]) Patient is on Janumet and glimepiride as an outpatient -Continue SSI   MRSA bacteremia Unknown Weaver. Complicated by port-a-cath in place. ID consulted. Recent Transthoracic Echocardiogram from 1/12 -ID recommendations -Continue  Vancomycin -TEE per ID pending for  tomorrow   Diarrhea Possibly related to chemotherapy. C. difficile negative. -Supportive care   COVID-19 virus infection Incidental finding. Remdesivir started on 1/23. No steroids secondary to lack of hypoxia. CRP significantly elevated in setting of bacteremia. -Continue Remdesivir IV -Daily CMP, CBC, CRP   HYPERLIPIDEMIA -given recent elevated LFT's, statin now on hold    DVT prophylaxis: Lovenox Code Status: Full Family Communication: Pt in room, family currently not at bedside  Status is: Inpatient  Remains inpatient appropriate because: Severity of illness    Consultants:  General Surgery ID Oncology  Procedures:  Removal of infusion port 1/25 I/D R axillary abscess 1/25  Antimicrobials: Anti-infectives (From admission, onward)    Start     Dose/Rate Route Frequency Ordered Stop   06/05/21 1000  vancomycin (VANCOCIN) IVPB 1000 mg/200 mL premix        1,000 mg 200 mL/hr over 60 Minutes Intravenous Every 12 hours 06/05/21 0908     05/31/21 1000  remdesivir 100 mg in sodium chloride 0.9 % 100 mL IVPB       See Hyperspace for full Linked Orders Report.   100 mg 200 mL/hr over 30 Minutes Intravenous Daily 05/30/21 0727 06/03/21 2234   05/31/21 0915  vancomycin (VANCOREADY) IVPB 1000 mg/200 mL  Status:  Discontinued        1,000 mg 200 mL/hr over 60 Minutes Intravenous Every 12 hours 05/31/21 0914 06/05/21 0908   05/30/21 1100  vancomycin (VANCOREADY) IVPB 1500 mg/300 mL  Status:  Discontinued        1,500 mg 150 mL/hr over 120 Minutes Intravenous Every 24 hours 05/30/21 0834 05/31/21 0914   05/30/21 1000  vancomycin (VANCOREADY) IVPB 750 mg/150 mL  Status:  Discontinued        750 mg 150 mL/hr over 60 Minutes Intravenous Every 12 hours 05/30/21 0052 05/30/21 0834   05/30/21 1000  remdesivir 200 mg in sodium chloride 0.9% 250 mL IVPB       See Hyperspace for full Linked Orders Report.   200 mg 580 mL/hr over 30 Minutes Intravenous Once 05/30/21 0727 05/30/21  2020   05/29/21 2345  famciclovir (FAMVIR) tablet 250 mg        250 mg Oral Weaver times daily 05/29/21 2247     05/29/21 2200  aztreonam (AZACTAM) injection Weaver g  Status:  Discontinued        Weaver g Intravenous Every 8 hours 05/29/21 1920 05/29/21 1935   05/29/21 2000  ceFEPIme (MAXIPIME) Weaver g in sodium chloride 0.9 % 100 mL IVPB  Status:  Discontinued        Weaver g 200 mL/hr over 30 Minutes Intravenous Every 8 hours 05/29/21 1936 05/30/21 1423   05/29/21 1930  vancomycin (VANCOCIN) IVPB 1000 mg/200 mL premix        1,000 mg 200 mL/hr over 60 Minutes Intravenous  Once 05/29/21 1921 05/30/21 0102       Subjective: Very keen on going home soon  Objective: Vitals:   06/04/21 2127 06/05/21 0511 06/05/21 0833 06/05/21 1326  BP: (!) 152/89 (!) 142/89  (!) 146/95  Pulse: 71 73  76  Resp: 20 18  18   Temp: 98.9 F (37.Weaver C) 98.4 F (36.9 C)  98.4 F (36.9 C)  TempSrc: Oral Oral  Oral  SpO2: 96% 96% 96% 99%  Weight:      Height:        Intake/Output Summary (Last 24 hours) at 06/05/2021 1511 Last  data filed at 06/05/2021 1454 Gross per 24 hour  Intake 200 ml  Output 4250 ml  Net -4050 ml    Filed Weights   05/29/21 2201  Weight: 80.5 kg    Examination: General exam: Awake, laying in bed, in nad Respiratory system: Normal respiratory effort, no wheezing Cardiovascular system: regular rate, s1, s2 Gastrointestinal system: Soft, nondistended, positive BS Central nervous system: CN2-12 grossly intact, strength intact Extremities: Perfused, no clubbing Skin: Normal skin turgor, no notable skin lesions seen Psychiatry: Mood normal // no visual hallucinations   Data Reviewed: I have personally reviewed following labs and imaging studies  CBC: Recent Labs  Lab 05/31/21 0411 06/01/21 0528 06/02/21 0844 06/03/21 0408 06/04/21 0403  WBC 1.5* Weaver.9* 5.4 6.0 5.9  NEUTROABS 1.1* Weaver.4 4.3 4.4 5.1  HGB 9.Weaver* 8.8* 10.Weaver* 10.0* 10.7*  HCT 26.8* 25.Weaver* 30.0* 30.Weaver* 32.1*  MCV 82.Weaver 81.6 83.8 84.8  84.5  PLT 43* 62* 107* 127* 983    Basic Metabolic Panel: Recent Labs  Lab 05/31/21 0411 06/01/21 0528 06/02/21 0844 06/03/21 0408 06/04/21 0403  NA 135 132* 133* 135 136  K Weaver.8* 3.3* 3.9 3.7 3.3*  CL 103 103 104 107 105  CO2 23 22 21* 22 25  GLUCOSE 126* 173* 244* 262* 243*  BUN 18 14 17 15 11   CREATININE 0.76 0.75 0.72 0.77 0.80  CALCIUM 7.Weaver* 7.1* 7.Weaver* 7.0* 7.5*  MG 1.6*  --   --   --   --     GFR: Estimated Creatinine Clearance: 98.3 mL/min (by C-G formula based on SCr of 0.8 mg/dL). Liver Function Tests: Recent Labs  Lab 05/31/21 0411 06/01/21 0528 06/02/21 0844 06/03/21 0408 06/04/21 0403  AST 22 167* 93* 50* 27  ALT 21 126* 126* 103* 76*  ALKPHOS 39 58 77 72 84  BILITOT 0.9 0.7 0.8 0.6 0.7  PROT 5.5* 5.1* 5.6* 5.4* 5.7*  ALBUMIN Weaver.3* Weaver.Weaver* Weaver.4* Weaver.3* Weaver.6*    Recent Labs  Lab 05/29/21 2055  LIPASE 21    No results for input(s): AMMONIA in the last 168 hours. Coagulation Profile: Recent Labs  Lab 05/29/21 1538  INR 1.Weaver    Cardiac Enzymes: No results for input(s): CKTOTAL, CKMB, CKMBINDEX, TROPONINI in the last 168 hours. BNP (last 3 results) No results for input(s): PROBNP in the last 8760 hours. HbA1C: No results for input(s): HGBA1C in the last 72 hours. CBG: Recent Labs  Lab 06/04/21 1133 06/04/21 1626 06/04/21 2124 06/05/21 0802 06/05/21 1137  GLUCAP 342* 280* 246* 295* 374*    Lipid Profile: No results for input(s): CHOL, HDL, LDLCALC, TRIG, CHOLHDL, LDLDIRECT in the last 72 hours. Thyroid Function Tests: No results for input(s): TSH, T4TOTAL, FREET4, T3FREE, THYROIDAB in the last 72 hours. Anemia Panel: No results for input(s): VITAMINB12, FOLATE, FERRITIN, TIBC, IRON, RETICCTPCT in the last 72 hours. Sepsis Labs: Recent Labs  Lab 05/29/21 1538 05/29/21 1738 05/29/21 2320 05/30/21 0012  LATICACIDVEN 3.8* Weaver.8* Weaver.8* Weaver.7*     Recent Results (from the past 240 hour(s))  Culture, blood (Routine x Weaver)     Status: Abnormal    Collection Time: 05/29/21  3:38 PM   Specimen: BLOOD  Result Value Ref Range Status   Specimen Description   Final    BLOOD RIGHT ANTECUBITAL Performed at [facility] [address], [place], [place] [address]    Special Requests   Final    BOTTLES DRAWN AEROBIC AND ANAEROBIC Blood Culture adequate volume Performed at [facility], [place] [address] [place], [place] [address]    Culture  Setup Time   Final    GRAM POSITIVE COCCI IN CLUSTERS AEROBIC BOTTLE ONLY CRITICAL RESULT CALLED TO, READ BACK BY AND VERIFIED WITH: Damian Leavell [id number] AT 1559 BY CM Performed at [facility], [place] [address] [place], [place] [address]    Culture METHICILLIN RESISTANT STAPHYLOCOCCUS AUREUS (A)  Final   Report Status 06/01/2021 FINAL  Final   Organism ID, Bacteria METHICILLIN RESISTANT STAPHYLOCOCCUS AUREUS  Final      Susceptibility   Methicillin resistant staphylococcus aureus - MIC*    CIPROFLOXACIN >=8 RESISTANT Resistant     ERYTHROMYCIN >=8 RESISTANT Resistant     GENTAMICIN <=0.5 SENSITIVE Sensitive     OXACILLIN >=4 RESISTANT Resistant     TETRACYCLINE <=1 SENSITIVE Sensitive     VANCOMYCIN 1 SENSITIVE Sensitive     TRIMETH/SULFA <=10 SENSITIVE Sensitive     CLINDAMYCIN <=0.25 SENSITIVE Sensitive     RIFAMPIN <=0.5 SENSITIVE Sensitive     Inducible Clindamycin NEGATIVE Sensitive     * METHICILLIN RESISTANT STAPHYLOCOCCUS AUREUS  Blood Culture ID Panel (Reflexed)     Status: Abnormal   Collection Time: 05/29/21  3:38 PM  Result Value Ref Range Status   Enterococcus faecalis NOT DETECTED NOT DETECTED Final   Enterococcus Faecium NOT DETECTED NOT DETECTED Final   Listeria monocytogenes NOT DETECTED NOT DETECTED Final   Staphylococcus species DETECTED (A) NOT DETECTED Final    Comment: CRITICAL RESULT CALLED TO, READ BACK BY AND VERIFIED WITH: PHARMD J GADHIA [id number] AT 1300 BY CM    Staphylococcus aureus (BCID) DETECTED (A) NOT DETECTED  Final    Comment: Methicillin (oxacillin)-resistant Staphylococcus aureus (MRSA). MRSA is predictably resistant to beta-lactam antibiotics (except ceftaroline). Preferred therapy is vancomycin unless clinically contraindicated. Patient requires contact precautions if  hospitalized. CRITICAL RESULT CALLED TO, READ BACK BY AND VERIFIED WITH: PHARMD J GADHIA [id number] AT 1300 BY CM    Staphylococcus epidermidis NOT DETECTED NOT DETECTED Final   Staphylococcus lugdunensis NOT DETECTED NOT DETECTED Final   Streptococcus species NOT DETECTED NOT DETECTED Final   Streptococcus agalactiae NOT DETECTED NOT DETECTED Final   Streptococcus pneumoniae NOT DETECTED NOT DETECTED Final   Streptococcus pyogenes NOT DETECTED NOT DETECTED Final   A.calcoaceticus-baumannii NOT DETECTED NOT DETECTED Final   Bacteroides fragilis NOT DETECTED NOT DETECTED Final   Enterobacterales NOT DETECTED NOT DETECTED Final   Enterobacter cloacae complex NOT DETECTED NOT DETECTED Final   Escherichia coli NOT DETECTED NOT DETECTED Final   Klebsiella aerogenes NOT DETECTED NOT DETECTED Final   Klebsiella oxytoca NOT DETECTED NOT DETECTED Final   Klebsiella pneumoniae NOT DETECTED NOT DETECTED Final   Proteus species NOT DETECTED NOT DETECTED Final   Salmonella species NOT DETECTED NOT DETECTED Final   Serratia marcescens NOT DETECTED NOT DETECTED Final   Haemophilus influenzae NOT DETECTED NOT DETECTED Final   Neisseria meningitidis NOT DETECTED NOT DETECTED Final   Pseudomonas aeruginosa NOT DETECTED NOT DETECTED Final   Stenotrophomonas maltophilia NOT DETECTED NOT DETECTED Final   Candida albicans NOT DETECTED NOT DETECTED Final   Candida auris NOT DETECTED NOT DETECTED Final   Candida glabrata NOT DETECTED NOT DETECTED Final   Candida krusei NOT DETECTED NOT DETECTED Final   Candida parapsilosis NOT DETECTED NOT DETECTED Final   Candida tropicalis NOT DETECTED NOT DETECTED Final   Cryptococcus neoformans/gattii NOT  DETECTED NOT DETECTED Final   Meth resistant mecA/C and MREJ DETECTED (  A) NOT DETECTED Final    Comment: CRITICAL RESULT CALLED TO, READ BACK BY AND VERIFIED WITH: Damian Leavell [id number] AT 1300 BY CM Performed at [facility], [place] [address] [place], [place] [address]   Culture, blood (Routine x Weaver)     Status: None   Collection Time: 05/29/21  3:43 PM   Specimen: BLOOD  Result Value Ref Range Status   Specimen Description   Final    BLOOD LEFT HAND Performed at [facility] [address] [place], [place] [address]    Special Requests   Final    BOTTLES DRAWN AEROBIC AND ANAEROBIC Blood Culture adequate volume Performed at [facility] [address], [place], [place] [address]    Culture   Final    NO GROWTH 5 DAYS Performed at [facility], [place] [address] [place], [place] [address]    Report Status 06/04/2021 FINAL  Final  Resp Panel by RT-PCR (Flu A&B, Covid)     Status: Abnormal   Collection Time: 05/29/21  7:19 PM   Specimen: Nasopharyngeal(NP) swabs in vial transport medium  Result Value Ref Range Status   SARS Coronavirus Weaver by RT PCR POSITIVE (A) NEGATIVE Final    Comment: (NOTE) SARS-CoV-Weaver target nucleic acids are DETECTED.  The SARS-CoV-Weaver RNA is generally detectable in upper respiratory specimens during the acute phase of infection. Positive results are indicative of the presence of the identified virus, but do not rule out bacterial infection or co-infection with other pathogens not detected by the test. Clinical correlation with patient history and other diagnostic information is necessary to determine patient infection status. The expected result is Negative.  Fact Sheet for Patients: EntrepreneurPulse.com.au  Fact Sheet for Healthcare Providers: IncredibleEmployment.be  This test is not yet approved or cleared by the [place] FDA and  has been authorized for  detection and/or diagnosis of SARS-CoV-Weaver by FDA under an Emergency Use Authorization (EUA).  This EUA will remain in effect (meaning this test can be used) for the duration of  the COVID-19 declaration under Section 564(b)(1) of the A ct, 21 U.S.C. section 360bbb-3(b)(1), unless the authorization is terminated or revoked sooner.     Influenza A by PCR NEGATIVE NEGATIVE Final   Influenza B by PCR NEGATIVE NEGATIVE Final    Comment: (NOTE) The Xpert Xpress SARS-CoV-Weaver/FLU/RSV plus assay is intended as an aid in the diagnosis of influenza from Nasopharyngeal swab specimens and should not be used as a sole basis for treatment. Nasal washings and aspirates are unacceptable for Xpert Xpress SARS-CoV-Weaver/FLU/RSV testing.  Fact Sheet for Patients: EntrepreneurPulse.com.au  Fact Sheet for Healthcare Providers: IncredibleEmployment.be  This test is not yet approved or cleared by the [place] FDA and has been authorized for detection and/or diagnosis of SARS-CoV-Weaver by FDA under an Emergency Use Authorization (EUA). This EUA will remain in effect (meaning this test can be used) for the duration of the COVID-19 declaration under Section 564(b)(1) of the Act, 21 U.S.C. section 360bbb-3(b)(1), unless the authorization is terminated or revoked.  Performed at [facility], [place] [address], [place], [place] [address]   C Difficile Quick Screen w PCR reflex     Status: None   Collection Time: 05/30/21  8:42 AM   Specimen: STOOL  Result Value Ref Range Status   C Diff antigen NEGATIVE NEGATIVE Final   C Diff toxin NEGATIVE NEGATIVE Final   C Diff interpretation No C. difficile detected.  Final  Comment: Performed at [facility], [place] [address], [place], [place] [address]  Urine Culture     Status: None   Collection Time: 05/30/21  1:44 PM   Specimen: Urine, Catheterized  Result Value Ref Range Status   Specimen  Description   Final    URINE, CATHETERIZED Performed at [facility] [address], [place], [place] [address]    Special Requests   Final    NONE Performed at [facility], [place] [address], [place], [place] [address]    Culture   Final    NO GROWTH Performed at [facility], [place] [address], [place], [place] [address]    Report Status 05/31/2021 FINAL  Final  Culture, blood (routine x Weaver)     Status: None (Preliminary result)   Collection Time: 05/31/21 11:07 PM   Specimen: BLOOD RIGHT ARM  Result Value Ref Range Status   Specimen Description   Final    BLOOD RIGHT ARM Performed at [facility] [address] [place], [place] [address]    Special Requests   Final    BOTTLES DRAWN AEROBIC ONLY Blood Culture adequate volume Performed at [facility] [address] [place], [place] [address]    Culture   Final    NO GROWTH 4 DAYS Performed at [facility], [place] [address], [place], [place] [address]    Report Status PENDING  Incomplete  Culture, blood (routine x Weaver)     Status: None (Preliminary result)   Collection Time: 05/31/21 11:07 PM   Specimen: BLOOD RIGHT WRIST  Result Value Ref Range Status   Specimen Description   Final    BLOOD RIGHT WRIST Performed at [facility] [address], [place], [place] [address]    Special Requests   Final    BOTTLES DRAWN AEROBIC ONLY Blood Culture adequate volume Performed at [facility] [address] [place], [place] [address]    Culture   Final    NO GROWTH 4 DAYS Performed at [facility], [place] [address], [place], [place] [address]    Report Status PENDING  Incomplete  Aerobic Culture w Gram Stain (superficial specimen)     Status: None   Collection Time: 06/01/21  3:20 PM   Specimen: Chest; Abscess  Result Value Ref Range Status   Specimen Description   Final    ABSCESS CHEST RIGHT  PORTA CATH Performed at [facility] [address], [place], [place] [address]    Special Requests   Final    NONE Performed at [facility], [place] [address], [place], [place] [address]    Gram Stain   Final    FEW WBC PRESENT, PREDOMINANTLY MONONUCLEAR NO ORGANISMS SEEN Performed at [facility], [place] [address], [place], [place] [address]    Culture   Final    RARE METHICILLIN RESISTANT STAPHYLOCOCCUS AUREUS RARE STAPHYLOCOCCUS HOMINIS    Report Status 06/04/2021 FINAL  Final   Organism ID, Bacteria METHICILLIN RESISTANT STAPHYLOCOCCUS AUREUS  Final   Organism ID, Bacteria STAPHYLOCOCCUS HOMINIS  Final      Susceptibility   Methicillin resistant staphylococcus aureus - MIC*    CIPROFLOXACIN >=8 RESISTANT Resistant     ERYTHROMYCIN >=8 RESISTANT Resistant     GENTAMICIN <=0.5 SENSITIVE Sensitive     OXACILLIN >=4 RESISTANT Resistant     TETRACYCLINE <=1 SENSITIVE Sensitive  VANCOMYCIN <=0.5 SENSITIVE Sensitive     TRIMETH/SULFA <=10 SENSITIVE Sensitive     CLINDAMYCIN <=0.25 SENSITIVE Sensitive     RIFAMPIN <=0.5 SENSITIVE Sensitive     Inducible Clindamycin NEGATIVE Sensitive     * RARE METHICILLIN RESISTANT STAPHYLOCOCCUS AUREUS   Staphylococcus hominis - MIC*    CIPROFLOXACIN <=0.5 SENSITIVE Sensitive     ERYTHROMYCIN <=0.25 SENSITIVE Sensitive     GENTAMICIN <=0.5 SENSITIVE Sensitive     OXACILLIN RESISTANT Resistant     TETRACYCLINE <=1 SENSITIVE Sensitive     VANCOMYCIN 1 SENSITIVE Sensitive     TRIMETH/SULFA <=10 SENSITIVE Sensitive     CLINDAMYCIN <=0.25 SENSITIVE Sensitive     RIFAMPIN <=0.5 SENSITIVE Sensitive     Inducible Clindamycin NEGATIVE Sensitive     * RARE STAPHYLOCOCCUS HOMINIS  Aerobic Culture w Gram Stain (superficial specimen)     Status: None   Collection Time: 06/01/21  3:25 PM   Specimen: Axilla, Right; Abscess  Result Value Ref Range Status   Specimen Description   Final    ABSCESS RIGHT  AXILLA Performed at [facility] [address], [place], [place] [address]    Special Requests   Final    NONE Performed at [facility], [place] [address], [place], [place] [address]    Gram Stain   Final    MODERATE WBC PRESENT,BOTH PMN AND MONONUCLEAR ABUNDANT GRAM POSITIVE COCCI Performed at [facility], [place] [address] [place], [place] [address]    Culture   Final    ABUNDANT METHICILLIN RESISTANT STAPHYLOCOCCUS AUREUS   Report Status 06/04/2021 FINAL  Final   Organism ID, Bacteria METHICILLIN RESISTANT STAPHYLOCOCCUS AUREUS  Final      Susceptibility   Methicillin resistant staphylococcus aureus - MIC*    CIPROFLOXACIN >=8 RESISTANT Resistant     ERYTHROMYCIN >=8 RESISTANT Resistant     GENTAMICIN <=0.5 SENSITIVE Sensitive     OXACILLIN >=4 RESISTANT Resistant     TETRACYCLINE <=1 SENSITIVE Sensitive     VANCOMYCIN 1 SENSITIVE Sensitive     TRIMETH/SULFA <=10 SENSITIVE Sensitive     CLINDAMYCIN <=0.25 SENSITIVE Sensitive     RIFAMPIN <=0.5 SENSITIVE Sensitive     Inducible Clindamycin NEGATIVE Sensitive     * ABUNDANT METHICILLIN RESISTANT STAPHYLOCOCCUS AUREUS      Radiology Studies: No results found.  Scheduled Meds:  arformoterol  15 mcg Nebulization BID   aspirin EC  81 mg Oral Q0600   atenolol  50 mg Oral Daily   Chlorhexidine Gluconate Cloth  6 each Topical Daily   DULoxetine  60 mg Oral QHS   enoxaparin (LOVENOX) injection  40 mg Subcutaneous Q24H   famciclovir  250 mg Oral BID   feeding supplement (NEPRO CARB STEADY)  237 mL Oral BID BM   insulin aspart  0-15 Units Subcutaneous TID WC   insulin aspart  0-5 Units Subcutaneous QHS   multivitamin with minerals  1 tablet Oral Daily   pantoprazole  40 mg Oral Daily   tamsulosin  0.4 mg Oral QPM   Continuous Infusions:  vancomycin 1,000 mg (06/05/21 0937)     LOS: 6 days   Marylu Lund, [facility] Pager On Amion  If 7PM-7AM, please contact  night-coverage 06/05/2021, 3:11 PM

## 2021-06-06 ENCOUNTER — Encounter (HOSPITAL_COMMUNITY)
Admission: EM | Disposition: A | Discharge status: Home Health | Payer: Self-pay | Source: Home / Self Care | Attending: Internal Medicine

## 2021-06-06 ENCOUNTER — Inpatient Hospital Stay (HOSPITAL_COMMUNITY): Payer: Medicare Other | Admitting: Certified Registered Nurse Anesthetist

## 2021-06-06 ENCOUNTER — Inpatient Hospital Stay (HOSPITAL_COMMUNITY): Payer: Medicare Other

## 2021-06-06 ENCOUNTER — Other Ambulatory Visit (HOSPITAL_BASED_OUTPATIENT_CLINIC_OR_DEPARTMENT_OTHER): Payer: Self-pay

## 2021-06-06 ENCOUNTER — Encounter (HOSPITAL_COMMUNITY): Payer: Self-pay | Admitting: Internal Medicine

## 2021-06-06 DIAGNOSIS — I10 Essential (primary) hypertension: Secondary | ICD-10-CM

## 2021-06-06 DIAGNOSIS — R6520 Severe sepsis without septic shock: Secondary | ICD-10-CM | POA: Diagnosis not present

## 2021-06-06 DIAGNOSIS — R7881 Bacteremia: Secondary | ICD-10-CM

## 2021-06-06 DIAGNOSIS — A419 Sepsis, unspecified organism: Secondary | ICD-10-CM | POA: Diagnosis not present

## 2021-06-06 HISTORY — PX: TEE WITHOUT CARDIOVERSION: SHX5443

## 2021-06-06 LAB — CBC
HCT: 31.1 % — ABNORMAL LOW (ref 39.0–52.0)
Hemoglobin: 11 g/dL — ABNORMAL LOW (ref 13.0–17.0)
MCH: 28.7 pg (ref 26.0–34.0)
MCHC: 35.4 g/dL (ref 30.0–36.0)
MCV: 81.2 fL (ref 80.0–100.0)
Platelets: 205 10*3/uL (ref 150–400)
RBC: 3.83 MIL/uL — ABNORMAL LOW (ref 4.22–5.81)
RDW: 14.1 % (ref 11.5–15.5)
WBC: 7.1 10*3/uL (ref 4.0–10.5)
nRBC: 1.1 % — ABNORMAL HIGH (ref 0.0–0.2)

## 2021-06-06 LAB — CULTURE, BLOOD (ROUTINE X 2)
Culture: NO GROWTH
Culture: NO GROWTH
Special Requests: ADEQUATE
Special Requests: ADEQUATE

## 2021-06-06 LAB — COMPREHENSIVE METABOLIC PANEL
ALT: 52 U/L — ABNORMAL HIGH (ref 0–44)
AST: 31 U/L (ref 15–41)
Albumin: 2.8 g/dL — ABNORMAL LOW (ref 3.5–5.0)
Alkaline Phosphatase: 86 U/L (ref 38–126)
Anion gap: 9 (ref 5–15)
BUN: 12 mg/dL (ref 8–23)
CO2: 25 mmol/L (ref 22–32)
Calcium: 8.3 mg/dL — ABNORMAL LOW (ref 8.9–10.3)
Chloride: 96 mmol/L — ABNORMAL LOW (ref 98–111)
Creatinine, Ser: 0.68 mg/dL (ref 0.61–1.24)
GFR, Estimated: >60 mL/min (ref >60)
Glucose, Bld: 312 mg/dL — ABNORMAL HIGH (ref 70–99)
Potassium: 4.2 mmol/L (ref 3.5–5.1)
Sodium: 130 mmol/L — ABNORMAL LOW (ref 135–145)
Total Bilirubin: 0.8 mg/dL (ref 0.3–1.2)
Total Protein: 6 g/dL — ABNORMAL LOW (ref 6.5–8.1)

## 2021-06-06 LAB — GLUCOSE, CAPILLARY
Glucose-Capillary: 276 mg/dL — ABNORMAL HIGH (ref 70–99)
Glucose-Capillary: 310 mg/dL — ABNORMAL HIGH (ref 70–99)
Glucose-Capillary: 359 mg/dL — ABNORMAL HIGH (ref 70–99)
Glucose-Capillary: 316 mg/dL — ABNORMAL HIGH (ref 70–99)

## 2021-06-06 SURGERY — ECHOCARDIOGRAM, TRANSESOPHAGEAL
Anesthesia: Monitor Anesthesia Care

## 2021-06-06 MED ORDER — PHENYLEPHRINE 40 MCG/ML (10ML) SYRINGE FOR IV PUSH (FOR BLOOD PRESSURE SUPPORT)
PREFILLED_SYRINGE | INTRAVENOUS | Status: DC | PRN
Start: 2021-06-06 — End: 2021-06-06
  Administered 2021-06-06 (×3): 80 ug via INTRAVENOUS
  Administered 2021-06-06: 40 ug via INTRAVENOUS

## 2021-06-06 MED ORDER — PROPOFOL 500 MG/50ML IV EMUL
INTRAVENOUS | Status: DC | PRN
  Administered 2021-06-06: 125 ug/kg/min via INTRAVENOUS

## 2021-06-06 MED ORDER — LACTATED RINGERS IV SOLN: INTRAVENOUS | Status: DC | PRN

## 2021-06-06 MED ORDER — SODIUM CHLORIDE 0.9 % IV SOLN: INTRAVENOUS | Status: DC

## 2021-06-06 MED ORDER — LIDOCAINE 2% (20 MG/ML) 5 ML SYRINGE
INTRAMUSCULAR | Status: DC | PRN
  Administered 2021-06-06: 60 mg via INTRAVENOUS

## 2021-06-06 MED ORDER — INSULIN GLARGINE-YFGN 100 UNIT/ML ~~LOC~~ SOLN
16.0000 [IU] | Freq: Every day | SUBCUTANEOUS | Status: DC
  Administered 2021-06-06 – 2021-06-07 (×2): 16 [IU] via SUBCUTANEOUS
  Filled 2021-06-06 (×3): qty 0.16

## 2021-06-06 MED ORDER — SODIUM CHLORIDE 0.9 % IV SOLN
INTRAVENOUS | Status: AC | PRN
  Administered 2021-06-06: 500 mL via INTRAVENOUS

## 2021-06-06 MED ORDER — PROPOFOL 10 MG/ML IV BOLUS
INTRAVENOUS | Status: DC | PRN
  Administered 2021-06-06: 10 mg via INTRAVENOUS
  Administered 2021-06-06: 15 mg via INTRAVENOUS

## 2021-06-06 NOTE — Progress Notes (Signed)
[facility] at bedside to transport pt back to [facility].  Debarah Crape, RN 06/06/21 12:37 PM

## 2021-06-06 NOTE — Anesthesia Procedure Notes (Signed)
Procedure Name: MAC Date/Time: 06/06/2021 11:25 AM Performed by: Janene Harvey, CRNA Pre-anesthesia Checklist: Patient identified and Patient being monitored Patient Re-evaluated:Patient Re-evaluated prior to induction Oxygen Delivery Method: Simple face mask Induction Type: IV induction Placement Confirmation: positive ETCO2 Dental Injury: Teeth and Oropharynx as per pre-operative assessment

## 2021-06-06 NOTE — Progress Notes (Signed)
Id brief note  Patient at [facility] for TEE  Labs/vitals/imagings reviewed 1/24 tte No evidence of valvular vegetations on  this transthoracic echocardiogram   1/23 ct abd pelv with contrast 1. No acute findings to explain the patient's abdominal pain. 2. Left intrarenal collecting system soft tissue mass and left common iliac nodal mass, consistent with the provided history of lymphoma. Appearance is similar to 02/25/2021. 3. Bibasilar peripheral predominant ground-glass opacities in the lungs may be postinfectious/postinflammatory in etiology, including due to COVID-19. 4. Enlarged steatotic liver. 5. Chronic calcific pancreatitis. 6. Aortic atherosclerosis   1/22 ct chest angio 1. No CT evidence for acute pulmonary embolus. 2. Since prior PET-CT of 04/15/2021, the patient has developed peripheral patchy and nodular areas of architectural distortion and ground-glass opacity. Imaging features are suggestive of sequelae of prior atypical infection, including viral etiology. Acute infectious/inflammatory process is not excluded but considered less likely. 3. The liver shows diffusely decreased attenuation suggesting fat deposition. 4.  Emphysema   Lines: 1/06-1/25 port-a-cath right chest  Abx: 1/22-c vancomycin  1/23-27 remdesivir 1/22-23 cefepime   Outpatient famciclovir continued   A/p Mrsa bacteremia Clabsi -- Port-a-cath removed 1/25 Right axillary abscess s/p I&D 1/25 Mild covid infection History of AAA endovascular stent repair 07/2020 12/21 right axillary lymph node excision biopsy Lymphoma recent chemo initiation mid 05/2021 Chemo related pancytopenia -- resolved  1/22 bcx 1 of 2 set positive mrsa (S tetra, bactrim) 1/24 bcx negative 1/25 right axillary surgical tissue cx mrsa 1/25 port-a-cath tip mrsa/staph hominis  Tte no valve vegetation. Awaiting tee   -continue vancomycin -f/u tee result

## 2021-06-06 NOTE — Progress Notes (Signed)
PROGRESS NOTE    Aaron ANTRIM Sr.  XHB:[id number] DOB: [birth year] DOA: 05/29/2021 PCP: [name], MD    Brief Narrative:  66 y.o. male with a history of diabetes mellitus type 2, AAA s/p endovascular repair, COPD, lymphoma recently started chemotherapy. Patient presented secondary to fall and weakness and found to have evidence of SIRS with unknown infectious source. Empiric antibiotics started. Blood cultures significant for MRSA. 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 Complicated by neutropenia in setting of recent chemotherapy. Blood cultures obtained and results confirm MRSA, without source. CT abdomen/pelvis ordered unremarkable for abscess. Started empirically on Vancomycin and Cefepime and switched to Vancomycin monotherapy. ID auto-consulted. -remains afebrile -Port removed by general surgery 1/25, MRSA noted on cath tip -TEE completed 1/30 with no vegetations seen -Will need PICC placed when cleared by ID and will need to arrange home abx   Atrial fibrillation with RVR ([place]) Likely transient and related to acute illness in addition to discontinuation of atenolol. Patient was initially started on amiodarone drip and converted back to sinus rhythm -BP improved and pt has since been transitioned off amio back to PO beta  blocker -Continues to be rate controlled   Axillary tenderness, right Patient with a somewhat remote history (05/28/20) excisional lymph node biopsy, still with tenderness and mild erythema around the site. Possible source for bacteremia -Pt now I/d of abscess 1/25 -Cultures of abscess thus far with abundant staph aureus   Acute urinary retention Unknown etiology at this time. Patient has required multiple I/O catheterization. Foley catheter placed on 1/23. Urine culture (1/23) with no growth, but culture obtained after antibiotics initiated. -Continue foley catheter, recommend f/u with urology after d/c   Pancytopenia ([place]) Secondary to recent chemotherapy. ANC of 800. Received G-CSF on 1/6. -Blood counts are continues to trend up -recheck cbc in AM   AKI (acute kidney injury) ([place]) Baseline creatinine of about 0.8-0.9. 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 Marin Olp and is on chemotherapy. First cycle started 1/13 and he received G-CSF on 1/6 -Continue Famvir for shingles prophylaxis   COPD (chronic obstructive pulmonary disease) ([place])- (present on admission) Stable. Patient is on Servent, albuterol as an outpatient -Continue Brovana (substitute for Servent) -Duoneb prn   AAA (abdominal aortic aneurysm) without rupture- (present on admission) History of repair.   Essential hypertension- (present on admission) Patient with hypotension.  BP now stable Atenolol resumed, at lower dose given recent bradycardia, now on home atenolol dose   DM2 (diabetes mellitus, type 2) ([place]) Patient is on Janumet and glimepiride as an outpatient -Continue SSI   MRSA bacteremia Unknown source. Complicated by port-a-cath in place. ID consulted. Recent Transthoracic Echocardiogram from 1/12 -ID recommendations -Continue  Vancomycin -TEE  without vegetations   Diarrhea Possibly related to chemotherapy. C. difficile negative. -Supportive care   COVID-19 virus infection Incidental finding. Remdesivir started on 1/23. No steroids secondary to lack of hypoxia. CRP significantly elevated in setting of bacteremia. -Completed Remdesivir   HYPERLIPIDEMIA -given recent elevated LFT's, statin now on hold  Possible Dilated Aortic Root -Incidentally on TEE, aortic root noted to be dilated to 78mm with recs for f/u CTA -Reviewed recent CTA chest from 1/22 with radiology over phone. Per Radiology, aortic root on that study measured to be 3.5cm    DVT prophylaxis: Lovenox Code Status: Full Family Communication: Pt in room, family currently not at bedside  Status is: Inpatient  Remains inpatient appropriate because: Severity of illness    Consultants:  General Surgery ID Oncology  Procedures:  Removal of infusion port 1/25 I/D R axillary abscess 1/25 TEE 1/30  Antimicrobials: Anti-infectives (From admission, onward)    Start     Dose/Rate Route Frequency Ordered Stop   06/05/21 1000  vancomycin (VANCOCIN) IVPB 1000 mg/200 mL premix        1,000 mg 200 mL/hr over 60 Minutes Intravenous Every 12 hours 06/05/21 0908     05/31/21 1000  remdesivir 100 mg in sodium chloride 0.9 % 100 mL IVPB       See Hyperspace for full Linked Orders Report.   100 mg 200 mL/hr over 30 Minutes Intravenous Daily 05/30/21 0727 06/03/21 2234   05/31/21 0915  vancomycin (VANCOREADY) IVPB 1000 mg/200 mL  Status:  Discontinued        1,000 mg 200 mL/hr over 60 Minutes Intravenous Every 12 hours 05/31/21 0914 06/05/21 0908   05/30/21 1100  vancomycin (VANCOREADY) IVPB 1500 mg/300 mL  Status:  Discontinued        1,500 mg 150 mL/hr over 120 Minutes Intravenous Every 24 hours 05/30/21 0834 05/31/21 0914   05/30/21 1000  vancomycin (VANCOREADY) IVPB 750 mg/150 mL  Status:  Discontinued        750 mg 150 mL/hr over 60 Minutes Intravenous Every  12 hours 05/30/21 0052 05/30/21 0834   05/30/21 1000  remdesivir 200 mg in sodium chloride 0.9% 250 mL IVPB       See Hyperspace for full Linked Orders Report.   200 mg 580 mL/hr over 30 Minutes Intravenous Once 05/30/21 0727 05/30/21 2020   05/29/21 2345  famciclovir (FAMVIR) tablet 250 mg        250 mg Oral 2 times daily 05/29/21 2247     05/29/21 2200  aztreonam (AZACTAM) injection 2 g  Status:  Discontinued        2 g Intravenous Every 8 hours 05/29/21 1920 05/29/21 1935   05/29/21 2000  ceFEPIme (MAXIPIME) 2 g in sodium chloride 0.9 % 100 mL IVPB  Status:  Discontinued        2 g 200 mL/hr over 30 Minutes Intravenous Every 8 hours 05/29/21 1936 05/30/21 1423   05/29/21 1930  vancomycin (VANCOCIN) IVPB 1000 mg/200 mL premix        1,000 mg 200 mL/hr over 60 Minutes Intravenous  Once 05/29/21 1921 05/30/21 0102       Subjective: Feeling well, hoping to go home soon  Objective: Vitals:   06/06/21 1152 06/06/21 1156 06/06/21 1202 06/06/21 1314  BP: (!) 94/54 101/74 111/81 120/87  Pulse: 72 72 69 (!) 59  Resp: 18 18 16    Temp:    (!) 97.5 F (36.4 C)  TempSrc:  Oral  SpO2: 94% 95% 95% 98%  Weight:      Height:        Intake/Output Summary (Last 24 hours) at 06/06/2021 1635 Last data filed at 06/06/2021 1141 Gross per 24 hour  Intake 640 ml  Output 2865 ml  Net -2225 ml    Filed Weights   05/29/21 2201  Weight: 80.5 kg    Examination: General exam: Conversant, in no acute distress Respiratory system: normal chest rise, clear, no audible wheezing Cardiovascular system: regular rhythm, s1-s2 Gastrointestinal system: Nondistended, nontender, pos BS Central nervous system: No seizures, no tremors Extremities: No cyanosis, no joint deformities Skin: No rashes, no pallor Psychiatry: Affect normal // no auditory hallucinations   Data Reviewed: I have personally reviewed following labs and imaging studies  CBC: Recent Labs  Lab 05/31/21 0411 06/01/21 0528  06/02/21 0844 06/03/21 0408 06/04/21 0403 06/06/21 0358  WBC 1.5* 2.9* 5.4 6.0 5.9 7.1  NEUTROABS 1.1* 2.4 4.3 4.4 5.1  --   HGB 9.2* 8.8* 10.2* 10.0* 10.7* 11.0*  HCT 26.8* 25.2* 30.0* 30.2* 32.1* 31.1*  MCV 82.2 81.6 83.8 84.8 84.5 81.2  PLT 43* 62* 107* 127* 164 403    Basic Metabolic Panel: Recent Labs  Lab 05/31/21 0411 06/01/21 0528 06/02/21 0844 06/03/21 0408 06/04/21 0403 06/06/21 0753  NA 135 132* 133* 135 136 130*  K 2.8* 3.3* 3.9 3.7 3.3* 4.2  CL 103 103 104 107 105 96*  CO2 23 22 21* 22 25 25   GLUCOSE 126* 173* 244* 262* 243* 312*  BUN 18 14 17 15 11 12   CREATININE 0.76 0.75 0.72 0.77 0.80 0.68  CALCIUM 7.2* 7.1* 7.2* 7.0* 7.5* 8.3*  MG 1.6*  --   --   --   --   --     GFR: Estimated Creatinine Clearance: 98.3 mL/min (by C-G formula based on SCr of 0.68 mg/dL). Liver Function Tests: Recent Labs  Lab 06/01/21 0528 06/02/21 0844 06/03/21 0408 06/04/21 0403 06/06/21 0753  AST 167* 93* 50* 27 31  ALT 126* 126* 103* 76* 52*  ALKPHOS 58 77 72 84 86  BILITOT 0.7 0.8 0.6 0.7 0.8  PROT 5.1* 5.6* 5.4* 5.7* 6.0*  ALBUMIN 2.2* 2.4* 2.3* 2.6* 2.8*    No results for input(s): LIPASE, AMYLASE in the last 168 hours.  No results for input(s): AMMONIA in the last 168 hours. Coagulation Profile: No results for input(s): INR, PROTIME in the last 168 hours.  Cardiac Enzymes: No results for input(s): CKTOTAL, CKMB, CKMBINDEX, TROPONINI in the last 168 hours. BNP (last 3 results) No results for input(s): PROBNP in the last 8760 hours. HbA1C: No results for input(s): HGBA1C in the last 72 hours. CBG: Recent Labs  Lab 06/05/21 1137 06/05/21 1708 06/05/21 2124 06/06/21 0729 06/06/21 1330  GLUCAP 374* 320* 331* 276* 310*    Lipid Profile: No results for input(s): CHOL, HDL, LDLCALC, TRIG, CHOLHDL, LDLDIRECT in the last 72 hours. Thyroid Function Tests: No results for input(s): TSH, T4TOTAL, FREET4, T3FREE, THYROIDAB in the last 72 hours. Anemia  Panel: No results for input(s): VITAMINB12, FOLATE, FERRITIN, TIBC, IRON, RETICCTPCT in the last 72 hours. Sepsis Labs: No results for input(s): PROCALCITON, LATICACIDVEN in the last 168 hours.   Recent Results (from the past 240 hour(s))  Culture, blood (Routine x 2)     Status: Abnormal   Collection Time: 05/29/21  3:38 PM   Specimen: BLOOD  Result Value Ref Range Status   Specimen Description   Final  BLOOD RIGHT ANTECUBITAL Performed at [facility] [address], [place], [place] [address]    Special Requests   Final    BOTTLES DRAWN AEROBIC AND ANAEROBIC Blood Culture adequate volume Performed at [place] [address], [place], [place] [address]    Culture  Setup Time   Final    GRAM POSITIVE COCCI IN CLUSTERS AEROBIC BOTTLE ONLY CRITICAL RESULT CALLED TO, READ BACK BY AND VERIFIED WITH: Damian Leavell [id number] AT 1559 BY CM Performed at [facility], [place] [address], [place], [place] [address]    Culture METHICILLIN RESISTANT STAPHYLOCOCCUS AUREUS (A)  Final   Report Status 06/01/2021 FINAL  Final   Organism ID, Bacteria METHICILLIN RESISTANT STAPHYLOCOCCUS AUREUS  Final      Susceptibility   Methicillin resistant staphylococcus aureus - MIC*    CIPROFLOXACIN >=8 RESISTANT Resistant     ERYTHROMYCIN >=8 RESISTANT Resistant     GENTAMICIN <=0.5 SENSITIVE Sensitive     OXACILLIN >=4 RESISTANT Resistant     TETRACYCLINE <=1 SENSITIVE Sensitive     VANCOMYCIN 1 SENSITIVE Sensitive     TRIMETH/SULFA <=10 SENSITIVE Sensitive     CLINDAMYCIN <=0.25 SENSITIVE Sensitive     RIFAMPIN <=0.5 SENSITIVE Sensitive     Inducible Clindamycin NEGATIVE Sensitive     * METHICILLIN RESISTANT STAPHYLOCOCCUS AUREUS  Blood Culture ID Panel (Reflexed)     Status: Abnormal   Collection Time: 05/29/21  3:38 PM  Result Value Ref Range Status   Enterococcus faecalis NOT DETECTED NOT DETECTED Final   Enterococcus Faecium NOT DETECTED NOT  DETECTED Final   Listeria monocytogenes NOT DETECTED NOT DETECTED Final   Staphylococcus species DETECTED (A) NOT DETECTED Final    Comment: CRITICAL RESULT CALLED TO, READ BACK BY AND VERIFIED WITH: PHARMD J GADHIA [id number] AT 1300 BY CM    Staphylococcus aureus (BCID) DETECTED (A) NOT DETECTED Final    Comment: Methicillin (oxacillin)-resistant Staphylococcus aureus (MRSA). MRSA is predictably resistant to beta-lactam antibiotics (except ceftaroline). Preferred therapy is vancomycin unless clinically contraindicated. Patient requires contact precautions if  hospitalized. CRITICAL RESULT CALLED TO, READ BACK BY AND VERIFIED WITH: PHARMD J GADHIA [id number] AT 85 BY CM    Staphylococcus epidermidis NOT DETECTED NOT DETECTED Final   Staphylococcus lugdunensis NOT DETECTED NOT DETECTED Final   Streptococcus species NOT DETECTED NOT DETECTED Final   Streptococcus agalactiae NOT DETECTED NOT DETECTED Final   Streptococcus pneumoniae NOT DETECTED NOT DETECTED Final   Streptococcus pyogenes NOT DETECTED NOT DETECTED Final   A.calcoaceticus-baumannii NOT DETECTED NOT DETECTED Final   Bacteroides fragilis NOT DETECTED NOT DETECTED Final   Enterobacterales NOT DETECTED NOT DETECTED Final   Enterobacter cloacae complex NOT DETECTED NOT DETECTED Final   Escherichia coli NOT DETECTED NOT DETECTED Final   Klebsiella aerogenes NOT DETECTED NOT DETECTED Final   Klebsiella oxytoca NOT DETECTED NOT DETECTED Final   Klebsiella pneumoniae NOT DETECTED NOT DETECTED Final   Proteus species NOT DETECTED NOT DETECTED Final   Salmonella species NOT DETECTED NOT DETECTED Final   Serratia marcescens NOT DETECTED NOT DETECTED Final   Haemophilus influenzae NOT DETECTED NOT DETECTED Final   Neisseria meningitidis NOT DETECTED NOT DETECTED Final   Pseudomonas aeruginosa NOT DETECTED NOT DETECTED Final   Stenotrophomonas maltophilia NOT DETECTED NOT DETECTED Final   Candida albicans NOT DETECTED NOT DETECTED Final    Candida auris NOT DETECTED NOT DETECTED Final   Candida glabrata NOT DETECTED NOT DETECTED Final   Candida krusei  NOT DETECTED NOT DETECTED Final   Candida parapsilosis NOT DETECTED NOT DETECTED Final   Candida tropicalis NOT DETECTED NOT DETECTED Final   Cryptococcus neoformans/gattii NOT DETECTED NOT DETECTED Final   Meth resistant mecA/C and MREJ DETECTED (A) NOT DETECTED Final    Comment: CRITICAL RESULT CALLED TO, READ BACK BY AND VERIFIED WITH: Damian Leavell [id number] AT 1300 BY CM Performed at [facility], [place] [address], [place], [place] [address]   Culture, blood (Routine x 2)     Status: None   Collection Time: 05/29/21  3:43 PM   Specimen: BLOOD  Result Value Ref Range Status   Specimen Description   Final    BLOOD LEFT HAND Performed at [facility] [address], [place], [place] [address]    Special Requests   Final    BOTTLES DRAWN AEROBIC AND ANAEROBIC Blood Culture adequate volume Performed at [facility] [address] [place], [place] [address]    Culture   Final    NO GROWTH 5 DAYS Performed at [facility], [place] [address] [place], [place] [address]    Report Status 06/04/2021 FINAL  Final  Resp Panel by RT-PCR (Flu A&B, Covid)     Status: Abnormal   Collection Time: 05/29/21  7:19 PM   Specimen: Nasopharyngeal(NP) swabs in vial transport medium  Result Value Ref Range Status   SARS Coronavirus 2 by RT PCR POSITIVE (A) NEGATIVE Final    Comment: (NOTE) SARS-CoV-2 target nucleic acids are DETECTED.  The SARS-CoV-2 RNA is generally detectable in upper respiratory specimens during the acute phase of infection. Positive results are indicative of the presence of the identified virus, but do not rule out bacterial infection or co-infection with other pathogens not detected by the test. Clinical correlation with patient history and other diagnostic information is necessary to determine patient infection  status. The expected result is Negative.  Fact Sheet for Patients: EntrepreneurPulse.com.au  Fact Sheet for Healthcare Providers: IncredibleEmployment.be  This test is not yet approved or cleared by the [place] FDA and  has been authorized for detection and/or diagnosis of SARS-CoV-2 by FDA under an Emergency Use Authorization (EUA).  This EUA will remain in effect (meaning this test can be used) for the duration of  the COVID-19 declaration under Section 564(b)(1) of the A ct, 21 U.S.C. section 360bbb-3(b)(1), unless the authorization is terminated or revoked sooner.     Influenza A by PCR NEGATIVE NEGATIVE Final   Influenza B by PCR NEGATIVE NEGATIVE Final    Comment: (NOTE) The Xpert Xpress SARS-CoV-2/FLU/RSV plus assay is intended as an aid in the diagnosis of influenza from Nasopharyngeal swab specimens and should not be used as a sole basis for treatment. Nasal washings and aspirates are unacceptable for Xpert Xpress SARS-CoV-2/FLU/RSV testing.  Fact Sheet for Patients: EntrepreneurPulse.com.au  Fact Sheet for Healthcare Providers: IncredibleEmployment.be  This test is not yet approved or cleared by the [place] FDA and has been authorized for detection and/or diagnosis of SARS-CoV-2 by FDA under an Emergency Use Authorization (EUA). This EUA will remain in effect (meaning this test can be used) for the duration of the COVID-19 declaration under Section 564(b)(1) of the Act, 21 U.S.C. section 360bbb-3(b)(1), unless the authorization is terminated or revoked.  Performed at [facility], [place] [address] [place],  [address]   C Difficile Quick Screen w PCR reflex     Status: None   Collection Time: 05/30/21  8:42 AM   Specimen: STOOL  Result Value Ref Range Status   C Diff antigen NEGATIVE NEGATIVE Final   C Diff toxin NEGATIVE NEGATIVE Final   C Diff  interpretation No C. difficile detected.  Final    Comment: Performed at [facility], [place] [address], [place], [place] [address]  Urine Culture     Status: None   Collection Time: 05/30/21  1:44 PM   Specimen: Urine, Catheterized  Result Value Ref Range Status   Specimen Description   Final    URINE, CATHETERIZED Performed at [facility] [address] [place], [place] [address]    Special Requests   Final    NONE Performed at [facility], [place] [address] [place], [place] [address]    Culture   Final    NO GROWTH Performed at [facility], [place] [address], [place], [place] [address]    Report Status 05/31/2021 FINAL  Final  Culture, blood (routine x 2)     Status: None   Collection Time: 05/31/21 11:07 PM   Specimen: BLOOD RIGHT ARM  Result Value Ref Range Status   Specimen Description   Final    BLOOD RIGHT ARM Performed at [facility] [address] [place], [place] [address]    Special Requests   Final    BOTTLES DRAWN AEROBIC ONLY Blood Culture adequate volume Performed at [facility] [address] [place], [place] [address]    Culture   Final    NO GROWTH 5 DAYS Performed at [facility], [place] [address], [place], [place] [address]    Report Status 06/06/2021 FINAL  Final  Culture, blood (routine x 2)     Status: None   Collection Time: 05/31/21 11:07 PM   Specimen: BLOOD RIGHT WRIST  Result Value Ref Range Status   Specimen Description   Final    BLOOD RIGHT WRIST Performed at [facility] [address] [place], [place] [address]    Special Requests   Final    BOTTLES DRAWN AEROBIC ONLY Blood Culture adequate volume Performed at [facility] [address] [place], [place] [address]    Culture   Final    NO GROWTH 5 DAYS Performed at [facility], [place] [address] [place], [place] [address]     Report Status 06/06/2021 FINAL  Final  Aerobic Culture w Gram Stain (superficial specimen)     Status: None   Collection Time: 06/01/21  3:20 PM   Specimen: Chest; Abscess  Result Value Ref Range Status   Specimen Description   Final    ABSCESS CHEST RIGHT PORTA CATH Performed at [facility] [address], [place], [place] [address]    Special Requests   Final    NONE Performed at [facility], [place] [address] [place], [place] [address]    Gram Stain   Final    FEW WBC PRESENT, PREDOMINANTLY MONONUCLEAR NO ORGANISMS SEEN Performed at [facility], [place] [address], [place],  [address]    Culture   Final    RARE METHICILLIN RESISTANT STAPHYLOCOCCUS AUREUS RARE STAPHYLOCOCCUS HOMINIS    Report Status 06/04/2021 FINAL  Final   Organism ID, Bacteria METHICILLIN RESISTANT STAPHYLOCOCCUS AUREUS  Final   Organism ID, Bacteria STAPHYLOCOCCUS HOMINIS  Final      Susceptibility   Methicillin resistant staphylococcus aureus - MIC*  CIPROFLOXACIN >=8 RESISTANT Resistant     ERYTHROMYCIN >=8 RESISTANT Resistant     GENTAMICIN <=0.5 SENSITIVE Sensitive     OXACILLIN >=4 RESISTANT Resistant     TETRACYCLINE <=1 SENSITIVE Sensitive     VANCOMYCIN <=0.5 SENSITIVE Sensitive     TRIMETH/SULFA <=10 SENSITIVE Sensitive     CLINDAMYCIN <=0.25 SENSITIVE Sensitive     RIFAMPIN <=0.5 SENSITIVE Sensitive     Inducible Clindamycin NEGATIVE Sensitive     * RARE METHICILLIN RESISTANT STAPHYLOCOCCUS AUREUS   Staphylococcus hominis - MIC*    CIPROFLOXACIN <=0.5 SENSITIVE Sensitive     ERYTHROMYCIN <=0.25 SENSITIVE Sensitive     GENTAMICIN <=0.5 SENSITIVE Sensitive     OXACILLIN RESISTANT Resistant     TETRACYCLINE <=1 SENSITIVE Sensitive     VANCOMYCIN 1 SENSITIVE Sensitive     TRIMETH/SULFA <=10 SENSITIVE Sensitive     CLINDAMYCIN <=0.25 SENSITIVE Sensitive     RIFAMPIN <=0.5 SENSITIVE Sensitive     Inducible Clindamycin NEGATIVE Sensitive      * RARE STAPHYLOCOCCUS HOMINIS  Aerobic Culture w Gram Stain (superficial specimen)     Status: None   Collection Time: 06/01/21  3:25 PM   Specimen: Axilla, Right; Abscess  Result Value Ref Range Status   Specimen Description   Final    ABSCESS RIGHT AXILLA Performed at [facility] [address], [place], [place] [address]    Special Requests   Final    NONE Performed at [facility], [place] [address], [place], [place] [address]    Gram Stain   Final    MODERATE WBC PRESENT,BOTH PMN AND MONONUCLEAR ABUNDANT GRAM POSITIVE COCCI Performed at [facility], [place] [address] [place], [place] [address]    Culture   Final    ABUNDANT METHICILLIN RESISTANT STAPHYLOCOCCUS AUREUS   Report Status 06/04/2021 FINAL  Final   Organism ID, Bacteria METHICILLIN RESISTANT STAPHYLOCOCCUS AUREUS  Final      Susceptibility   Methicillin resistant staphylococcus aureus - MIC*    CIPROFLOXACIN >=8 RESISTANT Resistant     ERYTHROMYCIN >=8 RESISTANT Resistant     GENTAMICIN <=0.5 SENSITIVE Sensitive     OXACILLIN >=4 RESISTANT Resistant     TETRACYCLINE <=1 SENSITIVE Sensitive     VANCOMYCIN 1 SENSITIVE Sensitive     TRIMETH/SULFA <=10 SENSITIVE Sensitive     CLINDAMYCIN <=0.25 SENSITIVE Sensitive     RIFAMPIN <=0.5 SENSITIVE Sensitive     Inducible Clindamycin NEGATIVE Sensitive     * ABUNDANT METHICILLIN RESISTANT STAPHYLOCOCCUS AUREUS      Radiology Studies: ECHO TEE  Result Date: 06/06/2021    TRANSESOPHOGEAL ECHO REPORT   Patient Name:   Aaron CROSSLEY Sr. Date of Exam: 06/06/2021 Medical Rec #:  [id number]          Height:       72.0 in Accession #:    [id number]         Weight:       177.5 lb Date of Birth:  Sep 30, 1955         BSA:          2.025 m Patient Age:    27 years           BP:           123/87 mmHg Patient Gender: M                  HR:  79 bpm. Exam Location:  Inpatient Procedure: Transesophageal Echo and Color Doppler Indications:      Bacteremia  History:         Patient has prior history of Echocardiogram examinations, most                  recent 05/31/2021. Abnormal ECG, COPD, Arrythmias:Atrial                  Fibrillation, Signs/Symptoms:Syncope and Bacteremia; Risk                  Factors:Hypertension, Diabetes, Dyslipidemia and Current                  Smoker. Covid positive.  Sonographer:     Roseanna Rainbow RDCS Referring Phys:  [place] Diagnosing Phys: Kirk Ruths MD  Sonographer Comments: Technically difficult study due to poor echo windows. PROCEDURE: After discussion of the risks and benefits of a TEE, an informed consent was obtained from the patient. The transesophogeal probe was passed without difficulty through the esophogus of the patient. Imaged were obtained with the patient in a left lateral decubitus position. Sedation performed by different physician. The patient was monitored while under deep sedation. Anesthestetic sedation was provided intravenously by Anesthesiology: 160mg  of Propofol, 60mg  of Lidocaine. The patient's vital signs; including heart rate, blood pressure, and oxygen saturation; remained stable throughout the procedure. The patient developed no complications during the procedure. IMPRESSIONS  1. No vegetations; moderately dilated aortic root; suggest CTA or MRA to further assess.  2. Left ventricular ejection fraction, by estimation, is 55 to 60%. The left ventricle has normal function. The left ventricle has no regional wall motion abnormalities.  3. Right ventricular systolic function is normal. The right ventricular size is normal.  4. No left atrial/left atrial appendage thrombus was detected.  5. The mitral valve is normal in structure. Trivial mitral valve regurgitation.  6. The aortic valve is tricuspid. Aortic valve regurgitation is trivial.  7. Aortic dilatation noted. There is moderate dilatation of the aortic root, measuring 46 mm. FINDINGS  Left Ventricle: Left ventricular ejection  fraction, by estimation, is 55 to 60%. The left ventricle has normal function. The left ventricle has no regional wall motion abnormalities. The left ventricular internal cavity size was normal in size. Right Ventricle: The right ventricular size is normal. Right ventricular systolic function is normal. Left Atrium: Left atrial size was normal in size. No left atrial/left atrial appendage thrombus was detected. Right Atrium: Right atrial size was normal in size. Pericardium: There is no evidence of pericardial effusion. Mitral Valve: The mitral valve is normal in structure. Trivial mitral valve regurgitation. Tricuspid Valve: The tricuspid valve is normal in structure. Tricuspid valve regurgitation is trivial. Aortic Valve: The aortic valve is tricuspid. Aortic valve regurgitation is trivial. Pulmonic Valve: The pulmonic valve was normal in structure. Pulmonic valve regurgitation is trivial. Aorta: Aortic dilatation noted. There is moderate dilatation of the aortic root, measuring 46 mm. IAS/Shunts: No atrial level shunt detected by color flow Doppler. Additional Comments: No vegetations; moderately dilated aortic root; suggest CTA or MRA to further assess. Kirk Ruths MD Electronically signed by Kirk Ruths MD Signature Date/Time: 06/06/2021/1:17:06 PM    Final     Scheduled Meds:  arformoterol  15 mcg Nebulization BID   aspirin EC  81 mg Oral Q0600   atenolol  100 mg Oral Daily   Chlorhexidine Gluconate Cloth  6 each Topical Daily   DULoxetine  60 mg Oral QHS   enoxaparin (LOVENOX) injection  40 mg Subcutaneous Q24H   famciclovir  250 mg Oral BID   feeding supplement (NEPRO CARB STEADY)  237 mL Oral BID BM   insulin aspart  0-15 Units Subcutaneous TID WC   insulin aspart  0-5 Units Subcutaneous QHS   multivitamin with minerals  1 tablet Oral Daily   pantoprazole  40 mg Oral Daily   tamsulosin  0.4 mg Oral QPM   Continuous Infusions:  vancomycin 1,000 mg (06/06/21 1009)     LOS: 7 days    Marylu Lund, MD [facility] Pager On Amion  If 7PM-7AM, please contact night-coverage 06/06/2021, 4:35 PM

## 2021-06-06 NOTE — Transfer of Care (Signed)
Immediate Anesthesia Transfer of Care Note  Patient: Aaron SCAIFE Sr.  Procedure(s) Performed: TRANSESOPHAGEAL ECHOCARDIOGRAM (TEE)  Patient Location: Endoscopy Unit  Anesthesia Type:MAC  Level of Consciousness: drowsy and patient cooperative  Airway & Oxygen Therapy: Patient Spontanous Breathing and Patient connected to face mask oxygen  Post-op Assessment: Report given to RN and Post -op Vital signs reviewed and stable  Post vital signs: Reviewed and stable  Last Vitals:  Vitals Value Taken Time  BP 94/50 06/06/21 1142  Temp    Pulse 73 06/06/21 1142  Resp 100 06/06/21 1142  SpO2 100 % 06/06/21 1142    Last Pain:  Vitals:   06/06/21 1142  TempSrc:   PainSc: 0-No pain      Patients Stated Pain Goal: 0 (21/03/12 8118)  Complications: No notable events documented.

## 2021-06-06 NOTE — Progress Notes (Signed)
° ° °  Transesophageal Echocardiogram Note  Aaron Weaver [id number] June 04, 1955  Procedure: Transesophageal Echocardiogram Indications: Bacteremia  Procedure Details Consent: Obtained Time Out: Verified patient identification, verified procedure, site/side was marked, verified correct patient position, special equipment/implants available, Radiology Safety Procedures followed,  medications/allergies/relevent history reviewed, required imaging and test results available.  Performed  Medications:  Pt sedated by anesthesia with diprovan 160 mg IV total.  Normal LV function; moderately dilated aortic root (4.6 cm; suggest CTA to further assess); trace AI, MR and TR; no vegetations.   Complications: No apparent complications Patient did tolerate procedure well.  Kirk Ruths, MD

## 2021-06-06 NOTE — Progress Notes (Signed)
Inpatient Diabetes Program Recommendations  AACE/ADA: New Consensus Statement on Inpatient Glycemic Control (2015)  Target Ranges:  Prepandial:   less than 140 mg/dL      Peak postprandial:   less than 180 mg/dL (1-2 hours)      Critically ill patients:  140 - 180 mg/dL   Lab Results  Component Value Date   GLUCAP 276 (H) 06/06/2021   HGBA1C 9.1 (H) 05/13/2021    Review of Glycemic Control  Latest Reference Range & Units 06/05/21 11:37 06/05/21 17:08 06/05/21 21:24 06/06/21 07:29  Glucose-Capillary 70 - 99 mg/dL 374 (H) 320 (H) 331 (H) 276 (H)   Diabetes history: DM 2 Outpatient Diabetes medications:  Amaryl 2 mg bid, Janumet XR 50-1000 mg bid Current orders for Inpatient glycemic control:  Novolog moderate tid with meals and HS  Inpatient Diabetes Program Recommendations:    Blood sugars remain > goal.  Consider adding Semglee 16 units daily.  A1C is 9.1%- May need insulin added at home after d/c?? Will follow.   Thanks,  Adah Perl, RN, BC-ADM Inpatient Diabetes Coordinator Pager [phone]  (8a-5p)

## 2021-06-06 NOTE — Progress Notes (Signed)
5 Days Post-Op  Subjective: CC: Some soreness at incision and drain site that is well controlled.   Objective: Vital signs in last 24 hours: Temp:  [97.6 F (36.4 C)-98.8 F (37.1 C)] 97.6 F (36.4 C) (01/30 0631) Pulse Rate:  [76-79] 79 (01/30 0631) Resp:  [18-22] 20 (01/30 0631) BP: (123-154)/(84-95) 123/87 (01/30 0631) SpO2:  [91 %-99 %] 94 % (01/30 0758) Last BM Date: 06/05/21  Intake/Output from previous day: 01/29 0701 - 01/30 0700 In: 640 [P.O.:240; IV Piggyback:400] Out: 4415 [Urine:4400; Drains:15] Intake/Output this shift: No intake/output data recorded.  PE: Chest: right chest previous port site clean without cellulitis Right axillary drain in place with purulent fluid in JP bulb. No overlying cellulitis   Lab Results:  Recent Labs    06/04/21 0403 06/06/21 0358  WBC 5.9 7.1  HGB 10.7* 11.0*  HCT 32.1* 31.1*  PLT 164 205   BMET Recent Labs    06/04/21 0403 06/06/21 0753  NA 136 130*  K 3.3* 4.2  CL 105 96*  CO2 25 25  GLUCOSE 243* 312*  BUN 11 12  CREATININE 0.80 0.68  CALCIUM 7.5* 8.3*   PT/INR No results for input(s): LABPROT, INR in the last 72 hours. CMP     Component Value Date/Time   NA 130 (L) 06/06/2021 0753   K 4.2 06/06/2021 0753   CL 96 (L) 06/06/2021 0753   CO2 25 06/06/2021 0753   GLUCOSE 312 (H) 06/06/2021 0753   BUN 12 06/06/2021 0753   CREATININE 0.68 06/06/2021 0753   CREATININE 0.87 05/20/2021 0913   CALCIUM 8.3 (L) 06/06/2021 0753   PROT 6.0 (L) 06/06/2021 0753   ALBUMIN 2.8 (L) 06/06/2021 0753   AST 31 06/06/2021 0753   AST 34 05/20/2021 0913   ALT 52 (H) 06/06/2021 0753   ALT 35 05/20/2021 0913   ALKPHOS 86 06/06/2021 0753   BILITOT 0.8 06/06/2021 0753   BILITOT 0.8 05/20/2021 0913   GFRNONAA >60 06/06/2021 0753   GFRNONAA >60 05/20/2021 0913   GFRAA >60 09/08/2019 1200   Lipase     Component Value Date/Time   LIPASE 21 05/29/2021 2055    Studies/Results: No results  found.  Anti-infectives: Anti-infectives (From admission, onward)    Start     Dose/Rate Route Frequency Ordered Stop   06/05/21 1000  vancomycin (VANCOCIN) IVPB 1000 mg/200 mL premix        1,000 mg 200 mL/hr over 60 Minutes Intravenous Every 12 hours 06/05/21 0908     05/31/21 1000  remdesivir 100 mg in sodium chloride 0.9 % 100 mL IVPB       See Hyperspace for full Linked Orders Report.   100 mg 200 mL/hr over 30 Minutes Intravenous Daily 05/30/21 0727 06/03/21 2234   05/31/21 0915  vancomycin (VANCOREADY) IVPB 1000 mg/200 mL  Status:  Discontinued        1,000 mg 200 mL/hr over 60 Minutes Intravenous Every 12 hours 05/31/21 0914 06/05/21 0908   05/30/21 1100  vancomycin (VANCOREADY) IVPB 1500 mg/300 mL  Status:  Discontinued        1,500 mg 150 mL/hr over 120 Minutes Intravenous Every 24 hours 05/30/21 0834 05/31/21 0914   05/30/21 1000  vancomycin (VANCOREADY) IVPB 750 mg/150 mL  Status:  Discontinued        750 mg 150 mL/hr over 60 Minutes Intravenous Every 12 hours 05/30/21 0052 05/30/21 0834   05/30/21 1000  remdesivir 200 mg in sodium chloride 0.9% 250 mL  IVPB       See Hyperspace for full Linked Orders Report.   200 mg 580 mL/hr over 30 Minutes Intravenous Once 05/30/21 0727 05/30/21 2020   05/29/21 2345  famciclovir (FAMVIR) tablet 250 mg        250 mg Oral 2 times daily 05/29/21 2247     05/29/21 2200  aztreonam (AZACTAM) injection 2 g  Status:  Discontinued        2 g Intravenous Every 8 hours 05/29/21 1920 05/29/21 1935   05/29/21 2000  ceFEPIme (MAXIPIME) 2 g in sodium chloride 0.9 % 100 mL IVPB  Status:  Discontinued        2 g 200 mL/hr over 30 Minutes Intravenous Every 8 hours 05/29/21 1936 05/30/21 1423   05/29/21 1930  vancomycin (VANCOCIN) IVPB 1000 mg/200 mL premix        1,000 mg 200 mL/hr over 60 Minutes Intravenous  Once 05/29/21 1921 05/30/21 0102        Assessment/Plan MRSA bacteremia Hx right axillary excisional lymph node biopsy 04/27/21 -POD#5  Removal of infusion port, and Drainage of right axillary abscess 1/25 Dr. Harlow Asa - R axilla abscess cx's with MRSA - R porta cath cx w/ MRSA and Staph Hominis  - Bcx w/ MRSA - On vanc. Cont abx per ID - Scheduled for TEE today.  - Oncology following, Dr. Marin Olp, and planning to put of cycle 2 of chemo for about a week till can get PICC.  - Continue axillary drain and monitor output - Continue abx per ID - We have arranged follow up in the office. Will plan to follow peripherally for now and see again later this week if remains in the hospital    FEN - NPO for procedure  VTE - Lovenox ID - vanc   COVID DM Large non-Hodgkin's lymphoma COPD GERD  This care required straightforward level of medical decision making.    LOS: 7 days    Jillyn Ledger , [facility] Surgery 06/06/2021, 8:32 AM Please see Amion for pager number during day hours 7:00am-4:30pm

## 2021-06-06 NOTE — Interval H&P Note (Signed)
History and Physical Interval Note:  06/06/2021 11:12 AM  Aaron Fee Sr.  has presented today for surgery, with the diagnosis of BACTERIMIA.  The various methods of treatment have been discussed with the patient and family. After consideration of risks, benefits and other options for treatment, the patient has consented to  Procedure(s): TRANSESOPHAGEAL ECHOCARDIOGRAM (TEE) (N/A) as a surgical intervention.  The patient's history has been reviewed, patient examined, no change in status, stable for surgery.  I have reviewed the patient's chart and labs.  Questions were answered to the patient's satisfaction.     Kirk Ruths

## 2021-06-06 NOTE — Progress Notes (Signed)
Aaron Weaver is going for TEE today.  Over the weekend he really had no problems.  He had no fever.  He has MRSA growing from the Port-A-Cath tip, and right axillary abscess that was drained.  He is on vancomycin.  He is still a bit sore in the right axilla.  He is eating okay.  He is having no problems with nausea or vomiting.  There is been no problems with diarrhea.  He is out of bed.  He has had no bleeding.  I guess the question is what will he be treated with as an outpatient?  How long will he need treatment as an outpatient?  When well a PICC line will be placed?  His labs show white count of 7.1.  Hemoglobin 11 and platelet count 205K.  His vital signs are temperature of 97.6.  Pulse 79.  Blood pressure 123/87.  His lungs are clear.  Cardiac exam regular rate and rhythm.  Abdomen is soft.  Bowel sounds are present.  Extremity shows no clubbing, cyanosis or edema.  Neurological exam shows no focal neurological deficits.  Hopefully, the TEE will be negative.  Again, he has MRSA in the blood, Port-A-Cath tip, and right axilla.  He is on IV vancomycin.  I know that Infectious Diseases is on top of this.  They will decide how long he will need antibiotic therapy as an outpatient.  He is due for cycle 2 of chemotherapy this week.  We will have to put this off for about a week.  We really need to get a PICC line placed into him before we can do treatment.  I know he has gotten incredible care from the wonderful staff up on [place], MD  Psalms 55:22

## 2021-06-06 NOTE — Progress Notes (Signed)
Pharmacy Antibiotic Note  Aaron Weaver. is a 66 y.o. male admitted on 05/29/2021 and found to have MRSA bacteremia. Pharmacy has been consulted for Vancomycin dosing.  Prior levels therapeutic on 1/27  Plan: - Continue Vancomycin 1g IV every 12 hours - Will continue to follow renal function, ID work-up, and LOT plans - Will only leave notes as needed for dose adjustments/levels  Height: 6' (182.9 cm) Weight: 80.5 kg (177 lb 7.5 oz) IBW/kg (Calculated) : 77.6  Temp (24hrs), Avg:98.2 F (36.8 C), Min:97.5 F (36.4 C), Max:98.8 F (37.1 C)  Recent Labs  Lab 06/01/21 0528 06/02/21 0844 06/02/21 1106 06/02/21 2202 06/03/21 0408 06/04/21 0403 06/06/21 0358 06/06/21 0753  WBC 2.9* 5.4  --   --  6.0 5.9 7.1  --   CREATININE 0.75 0.72  --   --  0.77 0.80  --  0.68  VANCOTROUGH  --   --   --  54* 18  --   --   --   VANCOPEAK  --   --  31  --   --   --   --   --      Estimated Creatinine Clearance: 98.3 mL/min (by C-G formula based on SCr of 0.68 mg/dL).    Allergies  Allergen Reactions   Fexofenadine Other (See Comments)    "dries me out too much"   Hydrochlorothiazide Other (See Comments)    REACTION: cramps   Penicillins Hives   Pravastatin Sodium Other (See Comments)    REACTION: aches   Requip [Ropinirole Hcl] Other (See Comments)    Bad dreams    Antimicrobials this admission: Cefepime 1/22 >> 1/23 Remdesivir 1/23 >> (1/27) Vancomycin 1/22 >>  Dose adjustments this admission:  Microbiology results: 1/22 COVID >> positive 1/22 BCx >> 1/4 MRSA 1/23 CDiff >> neg 1/23 UCx >> neg 1/24 BCx >> ngtd 1/25 R-axilla abscess >> abundant GPC on GS  Thank you for allowing pharmacy to be a part of this patients care.  Reuel Boom, PharmD, BCPS [phone] 06/06/2021, 2:44 PM

## 2021-06-06 NOTE — Progress Notes (Signed)
°  Echocardiogram Echocardiogram Transesophageal has been performed.  Bobbye Charleston 06/06/2021, 11:49 AM

## 2021-06-07 ENCOUNTER — Encounter (HOSPITAL_COMMUNITY): Payer: Self-pay | Admitting: Cardiology

## 2021-06-07 ENCOUNTER — Other Ambulatory Visit (HOSPITAL_COMMUNITY): Payer: Self-pay

## 2021-06-07 DIAGNOSIS — L02411 Cutaneous abscess of right axilla: Secondary | ICD-10-CM

## 2021-06-07 DIAGNOSIS — L0291 Cutaneous abscess, unspecified: Secondary | ICD-10-CM

## 2021-06-07 DIAGNOSIS — A419 Sepsis, unspecified organism: Secondary | ICD-10-CM | POA: Diagnosis not present

## 2021-06-07 DIAGNOSIS — B9562 Methicillin resistant Staphylococcus aureus infection as the cause of diseases classified elsewhere: Secondary | ICD-10-CM

## 2021-06-07 DIAGNOSIS — R6520 Severe sepsis without septic shock: Secondary | ICD-10-CM | POA: Diagnosis not present

## 2021-06-07 DIAGNOSIS — R7881 Bacteremia: Secondary | ICD-10-CM | POA: Diagnosis not present

## 2021-06-07 LAB — OSMOLALITY, URINE: Osmolality, Ur: 460 mOsm/kg (ref 300–900)

## 2021-06-07 LAB — COMPREHENSIVE METABOLIC PANEL
ALT: 54 U/L — ABNORMAL HIGH (ref 0–44)
AST: 41 U/L (ref 15–41)
Albumin: 2.8 g/dL — ABNORMAL LOW (ref 3.5–5.0)
Alkaline Phosphatase: 85 U/L (ref 38–126)
Anion gap: 7 (ref 5–15)
BUN: 13 mg/dL (ref 8–23)
CO2: 29 mmol/L (ref 22–32)
Calcium: 8.7 mg/dL — ABNORMAL LOW (ref 8.9–10.3)
Chloride: 94 mmol/L — ABNORMAL LOW (ref 98–111)
Creatinine, Ser: 0.83 mg/dL (ref 0.61–1.24)
GFR, Estimated: >60 mL/min (ref >60)
Glucose, Bld: 293 mg/dL — ABNORMAL HIGH (ref 70–99)
Potassium: 5 mmol/L (ref 3.5–5.1)
Sodium: 130 mmol/L — ABNORMAL LOW (ref 135–145)
Total Bilirubin: 0.9 mg/dL (ref 0.3–1.2)
Total Protein: 6.6 g/dL (ref 6.5–8.1)

## 2021-06-07 LAB — GLUCOSE, CAPILLARY
Glucose-Capillary: 264 mg/dL — ABNORMAL HIGH (ref 70–99)
Glucose-Capillary: 383 mg/dL — ABNORMAL HIGH (ref 70–99)
Glucose-Capillary: 323 mg/dL — ABNORMAL HIGH (ref 70–99)
Glucose-Capillary: 283 mg/dL — ABNORMAL HIGH (ref 70–99)
Glucose-Capillary: 329 mg/dL — ABNORMAL HIGH (ref 70–99)

## 2021-06-07 LAB — SODIUM, URINE, RANDOM: Sodium, Ur: 85 mmol/L

## 2021-06-07 LAB — OSMOLALITY: Osmolality: 279 mOsm/kg (ref 275–295)

## 2021-06-07 MED ORDER — INSULIN STARTER KIT- PEN NEEDLES (ENGLISH)
1.0000 | Freq: Once | Status: AC
  Administered 2021-06-08: 1
  Filled 2021-06-07: qty 1

## 2021-06-07 MED ORDER — INSULIN ASPART 100 UNIT/ML IJ SOLN
6.0000 [IU] | Freq: Three times a day (TID) | INTRAMUSCULAR | Status: DC
  Administered 2021-06-07: 6 [IU] via SUBCUTANEOUS

## 2021-06-07 NOTE — Progress Notes (Signed)
PROGRESS NOTE    [name].  WER:[id number] DOB: [birth year] DOA: 05/29/2021 PCP: [name], MD    Brief Narrative:  66 y.o. male with a history of diabetes mellitus type 2, AAA s/p endovascular repair, COPD, lymphoma recently started chemotherapy. Patient presented secondary to fall and weakness and found to have evidence of SIRS with unknown infectious source. Empiric antibiotics started. Blood cultures significant for MRSA. 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 Complicated by neutropenia in setting of recent chemotherapy. Blood cultures obtained and results confirm MRSA, without source. CT abdomen/pelvis ordered unremarkable for abscess. Started empirically on Vancomycin and Cefepime and switched to Vancomycin monotherapy. ID auto-consulted. -remains afebrile -Port removed by general surgery 1/25, MRSA noted on cath tip -TEE completed 1/30 with no vegetations seen -Will f/u on ID recs regarding abx moving forward   Atrial fibrillation with RVR (Aaron Weaver) Likely transient and related to acute illness in addition to discontinuation of atenolol. Patient was initially started on amiodarone drip and converted back to sinus rhythm -BP improved and pt has since been transitioned off amio back to PO beta blocker -Continues to be rate controlled    Axillary tenderness, right Patient with a somewhat remote history (05/28/20) excisional lymph node biopsy, still with tenderness and mild erythema around the site. Possible source for bacteremia -Pt now I/d of abscess 1/25 -Cultures of abscess has grown MRSA   Acute urinary retention Unknown etiology at this time. Patient has required multiple I/O catheterization. Foley catheter placed on 1/23. Urine culture (1/23) with no growth, but culture obtained after antibiotics initiated. -Continue foley catheter, recommend f/u with urology after d/c   Pancytopenia (Aaron Weaver) Secondary to recent chemotherapy. ANC of 800. Received G-CSF on 1/6. -All blood counts have been trending up -recheck cbc in AM   AKI (acute kidney injury) (Aaron Weaver) Baseline creatinine of about 0.8-0.9. 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 Marin Olp and is on chemotherapy. First cycle started 1/13 and he received G-CSF on 1/6 -Continue Famvir for shingles prophylaxis -Bone marrow biopsy planned for 2/1   COPD (chronic obstructive pulmonary disease) ([name])- (present on admission) Stable. Patient is on Servent, albuterol as an outpatient -Continue Brovana (substitute for Servent) -Duoneb prn   AAA (abdominal aortic aneurysm) without rupture- (present on admission) History of repair.   Essential hypertension- (present on admission) Patient with hypotension.  BP now stable Atenolol resumed, at lower dose given recent bradycardia, now on home atenolol dose   DM2 (diabetes mellitus, type 2) (Aaron Weaver) Patient is on Janumet and glimepiride as an outpatient -Continue SSI   MRSA bacteremia Unknown source. Complicated by port-a-cath in place. ID consulted. Recent Transthoracic Echocardiogram from 1/12 -Has been continued on Vancomycin -TEE  without vegetations -Will f/u on ID  recs regarding abx moving forward   Diarrhea Possibly related to chemotherapy. C. difficile negative. -Supportive care   COVID-19 virus infection Incidental finding. Remdesivir started on 1/23. No steroids secondary to lack of hypoxia. CRP significantly elevated in setting of bacteremia. -Completed Remdesivir   HYPERLIPIDEMIA -given recent elevated LFT's, statin now on hold  Possible Dilated Aortic Root -Incidentally on TEE, aortic root noted to be dilated to 95m with recs for f/u CTA -Reviewed recent CTA chest from 1/22 with radiology over phone. Per Radiology, aortic root on that study measured to be 3.5cm   Hyponatremia -Sodium acutely down to 130 -Pt asymptomatic -Reviewed serum and urine osm and urine Na. Serum osm borderline low at 279 with high urine Na of 85 suggesting SIADH -Pt is euvolemic on exam -Have placed pt on 1200cc fluid restricted diet -Repeat bmet in AM  DVT prophylaxis: Lovenox Code Status: Full Family Communication: Pt in room, family currently not at bedside  Status is: Inpatient  Remains inpatient appropriate because: Severity of illness    Consultants:  General Surgery ID Oncology  Procedures:  Removal of infusion port 1/25 I/D R axillary abscess 1/25 TEE 1/30  Antimicrobials: Anti-infectives (From admission, onward)    Start     Dose/Rate Route Frequency Ordered Stop   06/05/21 1000  vancomycin (VANCOCIN) IVPB 1000 mg/200 mL premix        1,000 mg 200 mL/hr over 60 Minutes Intravenous Every 12 hours 06/05/21 0908     05/31/21 1000  remdesivir 100 mg in sodium chloride 0.9 % 100 mL IVPB       See Hyperspace for full Linked Orders Report.   100 mg 200 mL/hr over 30 Minutes Intravenous Daily 05/30/21 0727 06/03/21 2234   05/31/21 0915  vancomycin (VANCOREADY) IVPB 1000 mg/200 mL  Status:  Discontinued        1,000 mg 200 mL/hr over 60 Minutes Intravenous Every 12 hours 05/31/21 0914 06/05/21 0908   05/30/21 1100  vancomycin  (VANCOREADY) IVPB 1500 mg/300 mL  Status:  Discontinued        1,500 mg 150 mL/hr over 120 Minutes Intravenous Every 24 hours 05/30/21 0834 05/31/21 0914   05/30/21 1000  vancomycin (VANCOREADY) IVPB 750 mg/150 mL  Status:  Discontinued        750 mg 150 mL/hr over 60 Minutes Intravenous Every 12 hours 05/30/21 0052 05/30/21 0834   05/30/21 1000  remdesivir 200 mg in sodium chloride 0.9% 250 mL IVPB       See Hyperspace for full Linked Orders Report.   200 mg 580 mL/hr over 30 Minutes Intravenous Once 05/30/21 0727 05/30/21 2020   05/29/21 2345  famciclovir (FAMVIR) tablet 250 mg        250 mg Oral 2 times daily 05/29/21 2247     05/29/21 2200  aztreonam (AZACTAM) injection 2 g  Status:  Discontinued        2 g Intravenous Every 8 hours 05/29/21 1920 05/29/21 1935   05/29/21 2000  ceFEPIme (MAXIPIME) 2 g in sodium chloride 0.9 % 100 mL IVPB  Status:  Discontinued        2 g 200 mL/hr over 30 Minutes Intravenous Every 8 hours 05/29/21 1936 05/30/21 1423   05/29/21 1930  vancomycin (VANCOCIN) IVPB 1000 mg/200 mL premix        1,000 mg 200 mL/hr over 60 Minutes Intravenous  Once 05/29/21 1921 05/30/21 0102       Subjective: States  feeling better. Eager to go home but would like to stay until problems all resolved  Objective: Vitals:   06/06/21 2050 06/07/21 0428 06/07/21 0903 06/07/21 1142  BP:  124/76  121/80  Pulse:  77  73  Resp:  16  16  Temp:  98.6 F (37 C)  99.1 F (37.3 C)  TempSrc:  Oral  Oral  SpO2: 96% 93% 93% 93%  Weight:      Height:        Intake/Output Summary (Last 24 hours) at 06/07/2021 1424 Last data filed at 06/07/2021 1228 Gross per 24 hour  Intake 766.9 ml  Output 1959.5 ml  Net -1192.6 ml    Filed Weights   05/29/21 2201  Weight: 80.5 kg    Examination: General exam: Awake, laying in bed, in nad Respiratory system: Normal respiratory effort, no wheezing Cardiovascular system: regular rate, s1, s2 Gastrointestinal system: Soft, nondistended,  positive BS Central nervous system: CN2-12 grossly intact, strength intact Extremities: Perfused, no clubbing Skin: Normal skin turgor, no notable skin lesions seen Psychiatry: Mood normal // no visual hallucinations   Data Reviewed: I have personally reviewed following labs and imaging studies  CBC: Recent Labs  Lab 06/01/21 0528 06/02/21 0844 06/03/21 0408 06/04/21 0403 06/06/21 0358  WBC 2.9* 5.4 6.0 5.9 7.1  NEUTROABS 2.4 4.3 4.4 5.1  --   HGB 8.8* 10.2* 10.0* 10.7* 11.0*  HCT 25.2* 30.0* 30.2* 32.1* 31.1*  MCV 81.6 83.8 84.8 84.5 81.2  PLT 62* 107* 127* 164 627    Basic Metabolic Panel: Recent Labs  Lab 06/02/21 0844 06/03/21 0408 06/04/21 0403 06/06/21 0753 06/07/21 0812  NA 133* 135 136 130* 130*  K 3.9 3.7 3.3* 4.2 5.0  CL 104 107 105 96* 94*  CO2 21* '22 25 25 29  ' GLUCOSE 244* 262* 243* 312* 293*  BUN '17 15 11 12 13  ' CREATININE 0.72 0.77 0.80 0.68 0.83  CALCIUM 7.2* 7.0* 7.5* 8.3* 8.7*    GFR: Estimated Creatinine Clearance: 94.8 mL/min (by C-G formula based on SCr of 0.83 mg/dL). Liver Function Tests: Recent Labs  Lab 06/02/21 0844 06/03/21 0408 06/04/21 0403 06/06/21 0753 06/07/21 0812  AST 93* 50* 27 31 41  ALT 126* 103* 76* 52* 54*  ALKPHOS 77 72 84 86 85  BILITOT 0.8 0.6 0.7 0.8 0.9  PROT 5.6* 5.4* 5.7* 6.0* 6.6  ALBUMIN 2.4* 2.3* 2.6* 2.8* 2.8*    No results for input(s): LIPASE, AMYLASE in the last 168 hours.  No results for input(s): AMMONIA in the last 168 hours. Coagulation Profile: No results for input(s): INR, PROTIME in the last 168 hours.  Cardiac Enzymes: No results for input(s): CKTOTAL, CKMB, CKMBINDEX, TROPONINI in the last 168 hours. BNP (last 3 results) No results for input(s): PROBNP in the last 8760 hours. HbA1C: No results for input(s): HGBA1C in the last 72 hours. CBG: Recent Labs  Lab 06/06/21 1330 06/06/21 1644 06/06/21 2111 06/07/21 0747 06/07/21 1138  GLUCAP 310* 359* 316* 264* 383*    Lipid  Profile: No results for input(s): CHOL, HDL, LDLCALC, TRIG, CHOLHDL, LDLDIRECT in the last 72 hours. Thyroid Function Tests: No results for input(s): TSH, T4TOTAL, FREET4, T3FREE, THYROIDAB in the last 72 hours. Anemia Panel: No results for input(s): VITAMINB12, FOLATE, FERRITIN, TIBC, IRON, RETICCTPCT in the last 72 hours. Sepsis Labs: No results for input(s): PROCALCITON, LATICACIDVEN in the last 168 hours.   Recent Results (from the past 240 hour(s))  Culture, blood (Routine x 2)  Status: Abnormal   Collection Time: 05/29/21  3:38 PM   Specimen: BLOOD  Result Value Ref Range Status   Specimen Description   Final    BLOOD RIGHT ANTECUBITAL Performed at [facility] [address], [place], [place] [address]    Special Requests   Final    BOTTLES DRAWN AEROBIC AND ANAEROBIC Blood Culture adequate volume Performed at [facility] [address] [place], [place] [address]    Culture  Setup Time   Final    GRAM POSITIVE COCCI IN CLUSTERS AEROBIC BOTTLE ONLY CRITICAL RESULT CALLED TO, READ BACK BY AND VERIFIED WITH: Damian Leavell [id number] AT 1559 BY CM Performed at [facility], [place] [address], [place], [place] [address]    Culture METHICILLIN RESISTANT STAPHYLOCOCCUS AUREUS (A)  Final   Report Status 06/01/2021 FINAL  Final   Organism ID, Bacteria METHICILLIN RESISTANT STAPHYLOCOCCUS AUREUS  Final      Susceptibility   Methicillin resistant staphylococcus aureus - MIC*    CIPROFLOXACIN >=8 RESISTANT Resistant     ERYTHROMYCIN >=8 RESISTANT Resistant     GENTAMICIN <=0.5 SENSITIVE Sensitive     OXACILLIN >=4 RESISTANT Resistant     TETRACYCLINE <=1 SENSITIVE Sensitive     VANCOMYCIN 1 SENSITIVE Sensitive     TRIMETH/SULFA <=10 SENSITIVE Sensitive     CLINDAMYCIN <=0.25 SENSITIVE Sensitive     RIFAMPIN <=0.5 SENSITIVE Sensitive     Inducible Clindamycin NEGATIVE Sensitive     * METHICILLIN RESISTANT STAPHYLOCOCCUS AUREUS   Blood Culture ID Panel (Reflexed)     Status: Abnormal   Collection Time: 05/29/21  3:38 PM  Result Value Ref Range Status   Enterococcus faecalis NOT DETECTED NOT DETECTED Final   Enterococcus Faecium NOT DETECTED NOT DETECTED Final   Listeria monocytogenes NOT DETECTED NOT DETECTED Final   Staphylococcus species DETECTED (A) NOT DETECTED Final    Comment: CRITICAL RESULT CALLED TO, READ BACK BY AND VERIFIED WITH: PHARMD J GADHIA [id number] AT 1300 BY CM    Staphylococcus aureus (BCID) DETECTED (A) NOT DETECTED Final    Comment: Methicillin (oxacillin)-resistant Staphylococcus aureus (MRSA). MRSA is predictably resistant to beta-lactam antibiotics (except ceftaroline). Preferred therapy is vancomycin unless clinically contraindicated. Patient requires contact precautions if  hospitalized. CRITICAL RESULT CALLED TO, READ BACK BY AND VERIFIED WITH: PHARMD J GADHIA [id number] AT 104 BY CM    Staphylococcus epidermidis NOT DETECTED NOT DETECTED Final   Staphylococcus lugdunensis NOT DETECTED NOT DETECTED Final   Streptococcus species NOT DETECTED NOT DETECTED Final   Streptococcus agalactiae NOT DETECTED NOT DETECTED Final   Streptococcus pneumoniae NOT DETECTED NOT DETECTED Final   Streptococcus pyogenes NOT DETECTED NOT DETECTED Final   A.calcoaceticus-baumannii NOT DETECTED NOT DETECTED Final   Bacteroides fragilis NOT DETECTED NOT DETECTED Final   Enterobacterales NOT DETECTED NOT DETECTED Final   Enterobacter cloacae complex NOT DETECTED NOT DETECTED Final   Escherichia coli NOT DETECTED NOT DETECTED Final   Klebsiella aerogenes NOT DETECTED NOT DETECTED Final   Klebsiella oxytoca NOT DETECTED NOT DETECTED Final   Klebsiella pneumoniae NOT DETECTED NOT DETECTED Final   Proteus species NOT DETECTED NOT DETECTED Final   Salmonella species NOT DETECTED NOT DETECTED Final   Serratia marcescens NOT DETECTED NOT DETECTED Final   Haemophilus influenzae NOT DETECTED NOT DETECTED Final    Neisseria meningitidis NOT DETECTED NOT DETECTED Final   Pseudomonas aeruginosa NOT DETECTED NOT DETECTED Final   Stenotrophomonas maltophilia NOT DETECTED NOT DETECTED Final  Candida albicans NOT DETECTED NOT DETECTED Final   Candida auris NOT DETECTED NOT DETECTED Final   Candida glabrata NOT DETECTED NOT DETECTED Final   Candida krusei NOT DETECTED NOT DETECTED Final   Candida parapsilosis NOT DETECTED NOT DETECTED Final   Candida tropicalis NOT DETECTED NOT DETECTED Final   Cryptococcus neoformans/gattii NOT DETECTED NOT DETECTED Final   Meth resistant mecA/C and MREJ DETECTED (A) NOT DETECTED Final    Comment: CRITICAL RESULT CALLED TO, READ BACK BY AND VERIFIED WITH: Damian Leavell [id number] AT 1300 BY CM Performed at [facility] [address], [place], [place] [address]   Culture, blood (Routine x 2)     Status: None   Collection Time: 05/29/21  3:43 PM   Specimen: BLOOD  Result Value Ref Range Status   Specimen Description   Final    BLOOD LEFT HAND Performed at [facility] [address] [place], [place] [address]    Special Requests   Final    BOTTLES DRAWN AEROBIC AND ANAEROBIC Blood Culture adequate volume Performed at [facility] [address] [place], [place] [address]    Culture   Final    NO GROWTH 5 DAYS Performed at [facility], [place] [address], [place], [place] [address]    Report Status 06/04/2021 FINAL  Final  Resp Panel by RT-PCR (Flu A&B, Covid)     Status: Abnormal   Collection Time: 05/29/21  7:19 PM   Specimen: Nasopharyngeal(NP) swabs in vial transport medium  Result Value Ref Range Status   SARS Coronavirus 2 by RT PCR POSITIVE (A) NEGATIVE Final    Comment: (NOTE) SARS-CoV-2 target nucleic acids are DETECTED.  The SARS-CoV-2 RNA is generally detectable in upper respiratory specimens during the acute phase of infection. Positive results are indicative of the presence of the identified  virus, but do not rule out bacterial infection or co-infection with other pathogens not detected by the test. Clinical correlation with patient history and other diagnostic information is necessary to determine patient infection status. The expected result is Negative.  Fact Sheet for Patients: EntrepreneurPulse.com.au  Fact Sheet for Healthcare Providers: IncredibleEmployment.be  This test is not yet approved or cleared by the [place] FDA and  has been authorized for detection and/or diagnosis of SARS-CoV-2 by FDA under an Emergency Use Authorization (EUA).  This EUA will remain in effect (meaning this test can be used) for the duration of  the COVID-19 declaration under Section 564(b)(1) of the A ct, 21 U.S.C. section 360bbb-3(b)(1), unless the authorization is terminated or revoked sooner.     Influenza A by PCR NEGATIVE NEGATIVE Final   Influenza B by PCR NEGATIVE NEGATIVE Final    Comment: (NOTE) The Xpert Xpress SARS-CoV-2/FLU/RSV plus assay is intended as an aid in the diagnosis of influenza from Nasopharyngeal swab specimens and should not be used as a sole basis for treatment. Nasal washings and aspirates are unacceptable for Xpert Xpress SARS-CoV-2/FLU/RSV testing.  Fact Sheet for Patients: EntrepreneurPulse.com.au  Fact Sheet for Healthcare Providers: IncredibleEmployment.be  This test is not yet approved or cleared by the [place] FDA and has been authorized for detection and/or diagnosis of SARS-CoV-2 by FDA under an Emergency Use Authorization (EUA). This EUA will remain in effect (meaning this test can be used) for the duration of the COVID-19 declaration under Section 564(b)(1) of the Act, 21 U.S.C. section 360bbb-3(b)(1), unless the authorization is terminated or revoked.  Performed at [facility], [place] [address], [place], [place] [address]   C Difficile  Quick Screen w PCR reflex     Status: None   Collection Time: 05/30/21  8:42 AM   Specimen: STOOL  Result Value Ref Range Status   C Diff antigen NEGATIVE NEGATIVE Final   C Diff toxin NEGATIVE NEGATIVE Final   C Diff interpretation No C. difficile detected.  Final    Comment: Performed at [facility], [place] [address] [place], [place] [address]  Urine Culture     Status: None   Collection Time: 05/30/21  1:44 PM   Specimen: Urine, Catheterized  Result Value Ref Range Status   Specimen Description   Final    URINE, CATHETERIZED Performed at [facility] [address], [place], [place] [address]    Special Requests   Final    NONE Performed at [facility], [place] [address], [place], [place] [address]    Culture   Final    NO GROWTH Performed at [facility], [place] [address], [place], [place] [address]    Report Status 05/31/2021 FINAL  Final  Culture, blood (routine x 2)     Status: None   Collection Time: 05/31/21 11:07 PM   Specimen: BLOOD RIGHT ARM  Result Value Ref Range Status   Specimen Description   Final    BLOOD RIGHT ARM Performed at [facility] [address], [place], [place] [address]    Special Requests   Final    BOTTLES DRAWN AEROBIC ONLY Blood Culture adequate volume Performed at [facility] [address], [place], [place] [address]    Culture   Final    NO GROWTH 5 DAYS Performed at [facility], [place] [address], [place], [place] [address]    Report Status 06/06/2021 FINAL  Final  Culture, blood (routine x 2)     Status: None   Collection Time: 05/31/21 11:07 PM   Specimen: BLOOD RIGHT WRIST  Result Value Ref Range Status   Specimen Description   Final    BLOOD RIGHT WRIST Performed at [facility] [address], [place], [place] [address]    Special Requests   Final    BOTTLES DRAWN AEROBIC ONLY Blood Culture adequate  volume Performed at [facility] [address], [place], [place] [address]    Culture   Final    NO GROWTH 5 DAYS Performed at [facility], [place] [address] [place], [place] [address]    Report Status 06/06/2021 FINAL  Final  Aerobic Culture w Gram Stain (superficial specimen)     Status: None   Collection Time: 06/01/21  3:20 PM   Specimen: Chest; Abscess  Result Value Ref Range Status   Specimen Description   Final    ABSCESS CHEST RIGHT PORTA CATH Performed at [facility] [address] [place], [place] [address]    Special Requests   Final    NONE Performed at [facility], [place] [address], [place], [place] [address]    Gram Stain   Final    FEW WBC PRESENT, PREDOMINANTLY MONONUCLEAR NO ORGANISMS SEEN Performed at [facility], [place] [address] [place], [place] [address]    Culture   Final    RARE METHICILLIN RESISTANT STAPHYLOCOCCUS AUREUS RARE STAPHYLOCOCCUS HOMINIS    Report Status 06/04/2021 FINAL  Final   Organism ID, Bacteria  METHICILLIN RESISTANT STAPHYLOCOCCUS AUREUS  Final   Organism ID, Bacteria STAPHYLOCOCCUS HOMINIS  Final      Susceptibility   Methicillin resistant staphylococcus aureus - MIC*    CIPROFLOXACIN >=8 RESISTANT Resistant     ERYTHROMYCIN >=8 RESISTANT Resistant     GENTAMICIN <=0.5 SENSITIVE Sensitive     OXACILLIN >=4 RESISTANT Resistant     TETRACYCLINE <=1 SENSITIVE Sensitive     VANCOMYCIN <=0.5 SENSITIVE Sensitive     TRIMETH/SULFA <=10 SENSITIVE Sensitive     CLINDAMYCIN <=0.25 SENSITIVE Sensitive     RIFAMPIN <=0.5 SENSITIVE Sensitive     Inducible Clindamycin NEGATIVE Sensitive     * RARE METHICILLIN RESISTANT STAPHYLOCOCCUS AUREUS   Staphylococcus hominis - MIC*    CIPROFLOXACIN <=0.5 SENSITIVE Sensitive     ERYTHROMYCIN <=0.25 SENSITIVE Sensitive     GENTAMICIN <=0.5 SENSITIVE Sensitive     OXACILLIN RESISTANT Resistant     TETRACYCLINE <=1 SENSITIVE  Sensitive     VANCOMYCIN 1 SENSITIVE Sensitive     TRIMETH/SULFA <=10 SENSITIVE Sensitive     CLINDAMYCIN <=0.25 SENSITIVE Sensitive     RIFAMPIN <=0.5 SENSITIVE Sensitive     Inducible Clindamycin NEGATIVE Sensitive     * RARE STAPHYLOCOCCUS HOMINIS  Aerobic Culture w Gram Stain (superficial specimen)     Status: None   Collection Time: 06/01/21  3:25 PM   Specimen: Axilla, Right; Abscess  Result Value Ref Range Status   Specimen Description   Final    ABSCESS RIGHT AXILLA Performed at [facility] [address], [place], [place] [address]    Special Requests   Final    NONE Performed at [facility], [place] [address], [place], [place] [address]    Gram Stain   Final    MODERATE WBC PRESENT,BOTH PMN AND MONONUCLEAR ABUNDANT GRAM POSITIVE COCCI Performed at [facility], [place] [address], [place], [place] [address]    Culture   Final    ABUNDANT METHICILLIN RESISTANT STAPHYLOCOCCUS AUREUS   Report Status 06/04/2021 FINAL  Final   Organism ID, Bacteria METHICILLIN RESISTANT STAPHYLOCOCCUS AUREUS  Final      Susceptibility   Methicillin resistant staphylococcus aureus - MIC*    CIPROFLOXACIN >=8 RESISTANT Resistant     ERYTHROMYCIN >=8 RESISTANT Resistant     GENTAMICIN <=0.5 SENSITIVE Sensitive     OXACILLIN >=4 RESISTANT Resistant     TETRACYCLINE <=1 SENSITIVE Sensitive     VANCOMYCIN 1 SENSITIVE Sensitive     TRIMETH/SULFA <=10 SENSITIVE Sensitive     CLINDAMYCIN <=0.25 SENSITIVE Sensitive     RIFAMPIN <=0.5 SENSITIVE Sensitive     Inducible Clindamycin NEGATIVE Sensitive     * ABUNDANT METHICILLIN RESISTANT STAPHYLOCOCCUS AUREUS      Radiology Studies: ECHO TEE  Result Date: 06/06/2021    TRANSESOPHOGEAL ECHO REPORT   Patient Name:   Aaron [facility]. Date of Exam: 06/06/2021 Medical Rec #:  [id number]          Height:       72.0 in Accession #:    [id number]         Weight:       177.5 lb Date of Birth:  12-Nov-1955          BSA:          2.025 m Patient Age:    2 years           BP:  123/87 mmHg Patient Gender: M                  HR:           79 bpm. Exam Location:  Inpatient Procedure: Transesophageal Echo and Color Doppler Indications:     Bacteremia  History:         Patient has prior history of Echocardiogram examinations, most                  recent 05/31/2021. Abnormal ECG, COPD, Arrythmias:Atrial                  Fibrillation, Signs/Symptoms:Syncope and Bacteremia; Risk                  Factors:Hypertension, Diabetes, Dyslipidemia and Current                  Smoker. Covid positive.  Sonographer:     Roseanna Rainbow RDCS Referring Phys:  [place] Diagnosing Phys: Kirk Ruths MD  Sonographer Comments: Technically difficult study due to poor echo windows. PROCEDURE: After discussion of the risks and benefits of a TEE, an informed consent was obtained from the patient. The transesophogeal probe was passed without difficulty through the esophogus of the patient. Imaged were obtained with the patient in a left lateral decubitus position. Sedation performed by different physician. The patient was monitored while under deep sedation. Anesthestetic sedation was provided intravenously by Anesthesiology: 118m of Propofol, 64mof Lidocaine. The patient's vital signs; including heart rate, blood pressure, and oxygen saturation; remained stable throughout the procedure. The patient developed no complications during the procedure. IMPRESSIONS  1. No vegetations; moderately dilated aortic root; suggest CTA or MRA to further assess.  2. Left ventricular ejection fraction, by estimation, is 55 to 60%. The left ventricle has normal function. The left ventricle has no regional wall motion abnormalities.  3. Right ventricular systolic function is normal. The right ventricular size is normal.  4. No left atrial/left atrial appendage thrombus was detected.  5. The mitral valve is normal in structure. Trivial mitral valve  regurgitation.  6. The aortic valve is tricuspid. Aortic valve regurgitation is trivial.  7. Aortic dilatation noted. There is moderate dilatation of the aortic root, measuring 46 mm. FINDINGS  Left Ventricle: Left ventricular ejection fraction, by estimation, is 55 to 60%. The left ventricle has normal function. The left ventricle has no regional wall motion abnormalities. The left ventricular internal cavity size was normal in size. Right Ventricle: The right ventricular size is normal. Right ventricular systolic function is normal. Left Atrium: Left atrial size was normal in size. No left atrial/left atrial appendage thrombus was detected. Right Atrium: Right atrial size was normal in size. Pericardium: There is no evidence of pericardial effusion. Mitral Valve: The mitral valve is normal in structure. Trivial mitral valve regurgitation. Tricuspid Valve: The tricuspid valve is normal in structure. Tricuspid valve regurgitation is trivial. Aortic Valve: The aortic valve is tricuspid. Aortic valve regurgitation is trivial. Pulmonic Valve: The pulmonic valve was normal in structure. Pulmonic valve regurgitation is trivial. Aorta: Aortic dilatation noted. There is moderate dilatation of the aortic root, measuring 46 mm. IAS/Shunts: No atrial level shunt detected by color flow Doppler. Additional Comments: No vegetations; moderately dilated aortic root; suggest CTA or MRA to further assess. BrKirk RuthsD Electronically signed by BrKirk RuthsD Signature Date/Time: 06/06/2021/1:17:06 PM    Final     Scheduled Meds:  arformoterol  15 mcg  Nebulization BID   aspirin EC  81 mg Oral Q0600   atenolol  100 mg Oral Daily   Chlorhexidine Gluconate Cloth  6 each Topical Daily   DULoxetine  60 mg Oral QHS   enoxaparin (LOVENOX) injection  40 mg Subcutaneous Q24H   famciclovir  250 mg Oral BID   feeding supplement (NEPRO CARB STEADY)  237 mL Oral BID BM   insulin aspart  0-15 Units Subcutaneous TID WC   insulin  aspart  0-5 Units Subcutaneous QHS   insulin aspart  6 Units Subcutaneous TID WC   insulin glargine-yfgn  16 Units Subcutaneous QHS   multivitamin with minerals  1 tablet Oral Daily   pantoprazole  40 mg Oral Daily   tamsulosin  0.4 mg Oral QPM   Continuous Infusions:  vancomycin 1,000 mg (06/07/21 1010)     LOS: 8 days   Marylu Lund, MD [facility] Pager On Amion  If 7PM-7AM, please contact night-coverage 06/07/2021, 2:24 PM

## 2021-06-07 NOTE — Progress Notes (Signed)
He went for his TEE yesterday.  Everything looked good on the TEE.  I am just sad that he has this Foley catheter in.  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  Has to go home with the Foley catheter and follow-up with urology as an outpatient.  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  I would suspect that he is going need to have a PICC line placed.  He was supposed to have a had a bone marrow biopsy last week.  This has not been done.  He was supposed to have this as an outpatient.  I am unsure we can do this with him as an inpatient.  He feels well overall.  He is eating without nausea or vomiting.  He is getting over the [place].  His vital signs are all stable.  Temperature 98.6.  Pulse 77.  Blood pressure 124/76.  His lungs are clear.  Cardiac exam regular rate and rhythm without a murmur.  Abdomen is soft.  Again, I hate that he has had this Foley catheter in.  Hopefully his bladder function will improve.  I do not know why he would have a bladder thus not working right.  I do not think he has any kind of a nerve issues in the lumbosacral area.  He is having no problems with bowel movements.  Again, he is going to need to have a PICC line if he is going to get outpatient antibiotics.  There is nothing to add from ID as to what he needs to be treated with as an outpatient and for how long.  Hopefully, he will be able to go home today.  Lattie Haw, MD  [place] 19:26

## 2021-06-07 NOTE — Discharge Instructions (Addendum)
° ° °  [facility] Stay Proper nutrition can help your body recover from illness and injury.   Foods and beverages high in protein, vitamins, and minerals help rebuild muscle loss, promote healing, & reduce fall risk.   In addition to eating healthy foods, a nutrition shake is an easy, delicious way to get the nutrition you need during and after your hospital stay  It is recommended that you continue to drink 2 bottles per day of: Nepro Shake, Glucerna, or Boost Glucose Control for at least 1 month (30 days) after your hospital stay   Tips for adding a nutrition shake into your routine: As allowed, drink one with vitamins or medications instead of water or juice Enjoy one as a tasty mid-morning or afternoon snack Drink cold or make a milkshake out of it Drink one instead of milk with cereal or snacks Use as a coffee creamer   Available at the following grocery stores and pharmacies:           * [place] [phone]            For COUPONS visit: www.ensure.com/join or [email or web address]-may.[email or web address]   Suggested Substitutions Ensure Plus = Boost Plus = Carnation Breakfast Essentials = Boost Compact Ensure Active Clear = Boost Breeze Glucerna Shake = Boost Glucose Control = Carnation Breakfast Essentials SUGAR FREE

## 2021-06-07 NOTE — TOC Benefit Eligibility Note (Signed)
Patient Teacher, English as a foreign language completed.    The patient is currently admitted and upon discharge could be taking linezolid (Zyvox) 600 mg tablets.  The current 14 day co-pay is, $100.00.   The current 7 day co-pay is, $62.13.   The patient is insured through [place], [place] Patient Advocate Specialist [place] Patient Advocate Team Direct Number: [phone]  Fax: [phone]

## 2021-06-07 NOTE — Progress Notes (Signed)
Inpatient Diabetes Program Recommendations  AACE/ADA: New Consensus Statement on Inpatient Glycemic Control (2015)  Target Ranges:  Prepandial:   less than 140 mg/dL      Peak postprandial:   less than 180 mg/dL (1-2 hours)      Critically ill patients:  140 - 180 mg/dL   Lab Results  Component Value Date   GLUCAP 323 (H) 06/07/2021   HGBA1C 9.1 (H) 05/13/2021    Review of Glycemic Control  Diabetes history: DM2 Outpatient Diabetes medications: Amaryl 2 mg BID, Janumet 50/1000 mg QHS Current orders for Inpatient glycemic control: Semglee 16 units QHS, Novolog 0-15 TID with meals and 0-5 HS + 6 units TID  HgbA1C 9.1% CBGs today: 264, 383, 323 mg/dL  Inpatient Diabetes Program Recommendations:    Pt was instructed by RN use of insulin pen. Insulin pen starter kit has been ordered. MD to give patient Rxs for insulin pens and insulin pen needles.  Increase Semglee to 20 units TID Increase Novolog to 10 units TID  Will f/u in am.  Thank you. Lorenda Peck, RD, LDN, CDE Inpatient Diabetes Coordinator [phone]

## 2021-06-07 NOTE — Consult Note (Signed)
Chief Complaint: Patient was seen in consultation today for CT guided bone marrow biopsy Chief Complaint  Patient presents with   Near Syncope    Referring Physician(s): Ennever,P  Supervising Physician: Daryll Brod  Patient Status: [facility] - In-pt  History of Present Illness: Aaron NICKSON Sr. is a 66 y.o. male , ex smoker, with PMH sig for AAA with prior stent grafting, anxiety/depression, COPD, HLD, GERD, DM recently admitted with MRSA bacteremia, PAF, urinary retention, AKI, COVID -84. He also has hx DLBCL with prior pancytopenia. Request received from oncology for CT guided bone marrow biopsy to rule out marrow involvement.   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 ([place]) 05/04/2021   Dysrhythmia    GERD (gastroesophageal reflux disease)    Impacted cerumen    Impotence of organic origin    Lumbago    Lung nodule    Other and unspecified hyperlipidemia    Routine general medical examination at a health care facility    Skipped heart beats    Tobacco use disorder    Type II or unspecified type diabetes mellitus without mention of complication, not stated as uncontrolled    type II   Unspecified essential hypertension     Past Surgical History:  Procedure Laterality Date   ABDOMINAL AORTIC ENDOVASCULAR STENT GRAFT Bilateral 07/12/2020   Procedure: ABDOMINAL AORTIC ENDOVASCULAR STENT GRAFT;  Surgeon: [name], MD;  Location: [facility] OR;  Service: Vascular;  Laterality: Bilateral;   AXILLARY LYMPH NODE BIOPSY Right 04/27/2021   Procedure: AXILLARY EXCISIONAL LYMPH NODE BIOPSY;  Surgeon: Dwan Bolt, MD;  Location: [place];  Service: General;   Laterality: Right;   BACK SURGERY     fracture   CARPECTOMY Right 09/11/2019   Procedure: PROXIMAL ROW CARPECTOMY; RADIAL STYLOIDECTOMY; POSTERIOR INTEROSSIUS NERVE RESECTION;  Surgeon: Daryll Brod, MD;  Location: [place];  Service: Orthopedics;  Laterality: Right;  AXILLARY BLOCK   COLONOSCOPY     INGUINAL HERNIA REPAIR     right   POLYPECTOMY     PORT-A-CATH REMOVAL N/A 06/01/2021   Procedure: REMOVAL PORT-A-CATH;  Surgeon: Armandina Gemma, MD;  Location: [facility];  Service: General;  Laterality: N/A;   PORTACATH PLACEMENT Right 05/13/2021   Procedure: INSERTION PORT-A-CATH;  Surgeon: Dwan Bolt, MD;  Location: [facility];  Service: General;  Laterality: Right;   TONSILLECTOMY      Allergies: Fexofenadine, Hydrochlorothiazide, Penicillins, Pravastatin sodium, and Requip [ropinirole hcl]  Medications: Prior to Admission medications   Medication Sig Start Date End Date Taking? Authorizing Provider  acetaminophen (TYLENOL) 325 MG tablet Take 650 mg by mouth as needed for mild pain, moderate pain or headache.   Yes [provider]  albuterol (PROAIR HFA) 108 (90 Base) MCG/ACT inhaler Inhale 1-2 puffs into the lungs every 6 (six) hours as needed for wheezing or shortness of breath. 10/15/20  Yes Collene Gobble, MD  alendronate (FOSAMAX) 70 MG tablet Take 70 mg by mouth every Sunday. 02/12/20  Yes [provider]  allopurinol (ZYLOPRIM) 100 MG tablet Take 1 tablet (100 mg total) by  mouth daily. Start 3 days BEFORE chemotherapy starts 05/04/21  Yes [name], MD  aspirin EC 81 MG EC tablet Take 1 tablet (81 mg total) by mouth daily at 6 (six) AM. Swallow whole. 07/14/20  Yes Ulyses Amor, PA-C  atenolol (TENORMIN) 100 MG tablet Take 100 mg by mouth daily. 09/02/20  Yes [provider]  calcium carbonate (OSCAL) 1500 (600 Ca) MG TABS tablet Take 600 mg of elemental calcium by mouth daily with breakfast.   Yes [provider]  diclofenac  Sodium (VOLTAREN) 1 % GEL Apply 2 g topically daily as needed (pain).   Yes [provider]  DULoxetine (CYMBALTA) 60 MG capsule Take 60 mg by mouth at bedtime. 04/01/18  Yes [provider]  famciclovir (FAMVIR) 250 MG tablet Take 1 tablet (250 mg total) by mouth 2 (two) times daily. 05/04/21  Yes Ennever, Rudell Cobb, MD  glimepiride (AMARYL) 2 MG tablet Take 1 tablet (2 mg total) by mouth daily before breakfast. Patient taking differently: Take 2 mg by mouth 2 (two) times daily. 04/03/12  Yes Plotnikov, Evie Lacks, MD  JANUMET XR 50-1000 MG TB24 Take 2 tablets by mouth at bedtime. 07/14/20  Yes Ulyses Amor, PA-C  LORazepam (ATIVAN) 0.5 MG tablet Take 1 tablet (0.5 mg total) by mouth every 8 (eight) hours. Take for nausea prn Patient taking differently: Take 0.5 mg by mouth every 8 (eight) hours as needed. nausea prn 05/17/21  Yes Ennever, Rudell Cobb, MD  omeprazole (PRILOSEC) 40 MG capsule Take 40 mg by mouth daily.   Yes [provider]  potassium chloride SA (KLOR-CON M20) 20 MEQ tablet TAKE 1 TABLET (20 MEQ TOTAL) BY MOUTH DAILY.  Patient taking differently: 20 mEq daily. 02/13/13  Yes Plotnikov, Evie Lacks, MD  rosuvastatin (CRESTOR) 10 MG tablet Take 10 mg by mouth daily. 03/16/20  Yes [provider]  SEREVENT DISKUS 50 MCG/ACT diskus inhaler 1 puff 2 (two) times daily. 05/25/21  Yes [provider]  tamsulosin (FLOMAX) 0.4 MG CAPS capsule Take 0.4 mg by mouth every evening.   Yes [provider]  Zinc 50 MG TABS Take 50 mg by mouth daily.   Yes [provider]  famciclovir (FAMVIR) 250 MG tablet Take 1 tablet (250 mg total) by mouth 2 (two) times daily. Patient not taking: Reported on 05/29/2021 05/04/21   [name], MD  Fish Oil OIL Take 1,500 mg by mouth daily.    [provider]  lidocaine-prilocaine (EMLA) cream Apply to affected area once 05/16/21   Ennever, Rudell Cobb, MD  ondansetron (ZOFRAN) 8 MG tablet Take 1 tablet  (8 mg total) by mouth 2 (two) times daily as needed for refractory nausea / vomiting. Start on day 3 after cyclophosphamide chemotherapy. 05/17/21   [name], MD  predniSONE (DELTASONE) 20 MG tablet Take 3 tablets (60 mg total) by mouth daily. Take with food on days 1-5 of chemotherapy. Patient not taking: Reported on 05/29/2021 05/17/21   [name], MD  prochlorperazine (COMPAZINE) 10 MG tablet Take 1 tablet (10 mg total) by mouth every 6 (six) hours as needed (Nausea or vomiting). 05/17/21   [name], MD  tadalafil (CIALIS) 5 MG tablet Take 5 mg by mouth daily as needed for erectile dysfunction.    [provider]  Tiotropium Bromide Monohydrate (SPIRIVA RESPIMAT) 2.5 MCG/ACT AERS Inhale 2 puffs into the lungs daily. Patient not taking: Reported on 05/29/2021 11/26/20   Collene Gobble,  MD     Family History  Problem Relation Age of Onset   Colon cancer Mother 55   Hypertension Father    Coronary artery disease Other        male<60 and male<50  1st degree relative   Colon polyps Brother    Esophageal cancer Neg Hx    Rectal cancer Neg Hx    Stomach cancer Neg Hx     Social History   Socioeconomic History   Marital status: Married    Spouse name: Burlon Centrella   Number of children: 1   Years of education: Not on file   Highest education level: Not on file  Occupational History   Occupation: [employer] bod[employer]  Tobacco Use   Smoking status: Former    Packs/day: 0.50    Years: 53.00    Pack years: 26.50    Types: Cigarettes    Start date: 03/31/1965    Quit date: 07/22/2017    Years since quitting: 3.8   Smokeless tobacco: Never   Tobacco comments:    started at age 29.  Vaping Use   Vaping Use: Never used  Substance and Sexual Activity   Alcohol use: Yes    Comment: rare beer once or twice   Drug use: No   Sexual activity: Yes  Other Topics Concern   Not on file  Social History Narrative   Not on file   Social Determinants of Health    Financial Resource Strain: Not on file  Food Insecurity: Not on file  Transportation Needs: Not on file  Physical Activity: Not on file  Stress: Not on file  Social Connections: Not on file      Review of Systems :currently denies fever,HA, CP, worsening resp issues, abd/back pain,N/V or bleeding  Vital Signs: BP 124/76 (BP Location: Right Arm)    Pulse 77    Temp 98.6 F (37 C) (Oral)    Resp 16    Ht 6' (1.829 m)    Wt 177 lb 7.5 oz (80.5 kg)    SpO2 93%    BMI 24.07 kg/m   Physical Exam (recent onc exam) His vital signs are all stable.  Temperature 98.6.  Pulse 77.  Blood pressure 124/76.  His lungs are clear.  Cardiac exam regular rate and rhythm without a murmur.  Abdomen is soft  Imaging: DG Chest 2 View  Result Date: 05/29/2021 CLINICAL DATA:  Suspected sepsis EXAM: CHEST - 2 VIEW COMPARISON:  05/13/2021 FINDINGS: Right-sided central venous port tip over the SVC. Partial clearing of previously noted patchy lower lung airspace opacities. Normal cardiomediastinal silhouette. No pneumothorax. IMPRESSION: Partial but incomplete clearing of previously noted patchy bilateral lower lung opacities suspicious for pneumonia. No new airspace disease is seen. Electronically Signed   By: Donavan Foil M.D.   On: 05/29/2021 16:15   CT Head Wo Contrast  Result Date: 05/29/2021 CLINICAL DATA:  Near syncope. Fall. Mental status change, unknown cause EXAM: CT HEAD WITHOUT CONTRAST TECHNIQUE: Contiguous axial images were obtained from the base of the skull through the vertex without intravenous contrast. RADIATION DOSE REDUCTION: This exam was performed according to the departmental dose-optimization program which includes automated exposure control, adjustment of the mA and/or kV according to patient size and/or use of iterative reconstruction technique. COMPARISON:  03/28/2012 FINDINGS: Brain: No acute intracranial abnormality. Specifically, no hemorrhage, hydrocephalus, mass lesion, acute  infarction, or significant intracranial injury. Vascular: No hyperdense vessel or unexpected calcification. Skull: No acute calvarial abnormality. Sinuses/Orbits: No  acute findings Other: None IMPRESSION: No acute intracranial abnormality. Electronically Signed   By: Rolm Baptise M.D.   On: 05/29/2021 18:38   CT Angio Chest PE W/Cm &/Or Wo Cm  Result Date: 05/29/2021 CLINICAL DATA:  Near syncope.  Fall.  PE suspected. EXAM: CT ANGIOGRAPHY CHEST WITH CONTRAST TECHNIQUE: Multidetector CT imaging of the chest was performed using the standard protocol during bolus administration of intravenous contrast. Multiplanar CT image reconstructions and MIPs were obtained to evaluate the vascular anatomy. RADIATION DOSE REDUCTION: This exam was performed according to the departmental dose-optimization program which includes automated exposure control, adjustment of the mA and/or kV according to patient size and/or use of iterative reconstruction technique. CONTRAST:  23m OMNIPAQUE IOHEXOL 350 MG/ML SOLN COMPARISON:  Standard CT chest 10/14/2019 FINDINGS: Cardiovascular: The heart size is normal. No substantial pericardial effusion. Coronary artery calcification is evident. Mild atherosclerotic calcification is noted in the wall of the thoracic aorta. There is no filling defect within the opacified pulmonary arteries to suggest the presence of an acute pulmonary embolus. Right Port-A-Cath tip is positioned in the distal SVC. Mediastinum/Nodes: No mediastinal lymphadenopathy. There is no hilar lymphadenopathy. The esophagus has normal imaging features. There is no axillary lymphadenopathy. Lungs/Pleura: Interval development of peripheral architectural distortion and scarring with areas of peripheral ground-glass opacity since prior chest CT and also since PET-CT of 04/15/2021. No focal airspace consolidation. No pleural effusion. No pneumothorax. Paraseptal emphysema noted in the lung apices. Upper Abdomen: The liver shows  diffusely decreased attenuation suggesting fat deposition. Tiny fat density lesions in both adrenal glands are consistent with tiny adenomas or myelolipomas, stable. Musculoskeletal: No worrisome lytic or sclerotic osseous abnormality. Old fracture nonunion posterior right tenth rib with healed posterior ninth rib fracture. Evidence of previous vertebral augmentation at L1, incompletely visualized. Review of the MIP images confirms the above findings. IMPRESSION: 1. No CT evidence for acute pulmonary embolus. 2. Since prior PET-CT of 04/15/2021, the patient has developed peripheral patchy and nodular areas of architectural distortion and ground-glass opacity. Imaging features are suggestive of sequelae of prior atypical infection, including viral etiology. Acute infectious/inflammatory process is not excluded but considered less likely. 3. The liver shows diffusely decreased attenuation suggesting fat deposition. 4.  Emphysema (ICD10-J43.9) and Aortic Atherosclerosis (ICD10-170.0) Electronically Signed   By: EMisty StanleyM.D.   On: 05/29/2021 18:47   CT ABDOMEN PELVIS W CONTRAST  Result Date: 05/30/2021 CLINICAL DATA:  Abdominal pain, lymphoma. EXAM: CT ABDOMEN AND PELVIS WITH CONTRAST TECHNIQUE: Multidetector CT imaging of the abdomen and pelvis was performed using the standard protocol following bolus administration of intravenous contrast. RADIATION DOSE REDUCTION: This exam was performed according to the departmental dose-optimization program which includes automated exposure control, adjustment of the mA and/or kV according to patient size and/or use of iterative reconstruction technique. CONTRAST:  1029mOMNIPAQUE IOHEXOL 300 MG/ML  SOLN COMPARISON:  CT chest 05/29/2021 PET 04/15/2021 and CT angiography abdomen and pelvis 02/25/2021. FINDINGS: Lower chest: Peripheral predominant patchy areas of ground-glass, new from 04/15/2021. Heart size is normal. No pericardial or pleural effusion. Atherosclerotic  calcification of the aorta, aortic valve and coronary arteries. Hepatobiliary: Liver is decreased in attenuation diffusely and is enlarged, measuring 20.0 cm. Subcentimeter low-attenuation lesion in the dome of the right hepatic lobe is too small to characterize. Liver and gallbladder are otherwise unremarkable. No biliary ductal dilatation. Pancreas: There are a few scattered calcifications in the pancreas. Spleen: Negative. Adrenals/Urinary Tract: Adrenal glands are unremarkable. Subcentimeter fat density lesions in the left  adrenal gland, likely myelolipomas. Right kidney is unremarkable. Soft tissue fullness is again seen in the left intrarenal collecting system and left renal pelvis. Ureters are decompressed. Bladder is grossly unremarkable. Stomach/Bowel: Stomach, small bowel, appendix and colon are unremarkable. Vascular/Lymphatic: Atherosclerotic calcification of the aorta with an endovascular stent graft in place. Infiltrative appearing soft tissue in the left common iliac station measures approximately 2.0 cm (2/69), similar. No additional pathologically enlarged lymph nodes. Reproductive: Prostate is visualized. Other: Small left inguinal hernia contains fat. Mesenteries and peritoneum are unremarkable. Musculoskeletal: Degenerative changes in the spine. Old bilateral rib fractures. T12 vertebral body augmentation. No worrisome lytic or sclerotic lesions. IMPRESSION: 1. No acute findings to explain the patient's abdominal pain. 2. Left intrarenal collecting system soft tissue mass and left common iliac nodal mass, consistent with the provided history of lymphoma. Appearance is similar to 02/25/2021. 3. Bibasilar peripheral predominant ground-glass opacities in the lungs may be postinfectious/postinflammatory in etiology, including due to COVID-19. 4. Enlarged steatotic liver. 5. Chronic calcific pancreatitis. 6. Aortic atherosclerosis (ICD10-I70.0). Coronary artery calcification. Electronically Signed    By: Lorin Picket M.D.   On: 05/30/2021 13:52   DG BONE DENSITY (DXA)  Result Date: 05/11/2021 EXAM: DUAL X-RAY ABSORPTIOMETRY (DXA) FOR BONE MINERAL DENSITY IMPRESSION: Referring Physician:  [name] Your patient completed a bone mineral density test using GE Lunar iDXA system (analysis version: 16). Technologist: [place] PATIENT: Name: Halley, Kincer Patient ID: [id number] Birth Date: [birth year] Height: 71.0 in. Sex: Male Measured: 05/11/2021 Weight: 198.0 lbs. Indications: Caucasian, Diabetic non insulin Fractures: Right Wrist, Vertebrae Treatments: Calcium (E943.0), Fosamax, Vitamin D (E933.5) ASSESSMENT: The BMD measured at Femur Neck Left is 0.920 g/cm2 with a T-score of -0.8. This patient is considered normal according to [facility]) criteria. The quality of the exam is good. The lumbar spine was excluded due to degenerative and surgical changes. Site Region Measured Date Measured Age [age]A BMD Significant CHANGE T-score DualFemur Neck Left 05/11/2019 67.1 -0.6 0.920 g/cm2 DualFemur Neck Left 11/06/2018 64.6 -1.0 0.897 g/cm2 DualFemur Total Mean 05/11/2021 67.1 0.5 1.069 g/cm2 * DualFemur Total Mean 11/06/2018 64.6 0.1 1.017 g/cm2 Left Forearm Radius 33% 05/11/2021 67.1 -0.3 0.959 g/cm2 Left Forearm Radius 33% 11/06/2018 64.6 -0.6 0.947 g/cm2 World Health Organization [facility]) criteria for post-menopausal, Caucasian Women: Normal       T-score at or above -1 SD Osteopenia   T-score between -1 and -2.5 SD Osteoporosis T-score at or below -2.5 SD RECOMMENDATION: [facility] recommends that FDA-approved medical therapies be considered in postmenopausal women and men age 6 or older with a: 1. Hip or vertebral (clinical or morphometric) fracture. 2. T-score of less than or equal to -2.5 at the spine or hip. 3. Ten-year fracture probability by FRAX of 3% or greater for hip fracture or 20% or greater for major osteoporotic fracture. All treatment decisions require clinical  judgment and consideration of individual patient factors, including patient preferences, co-morbidities, previous drug use, risk factors not captured in the FRAX model (e.g. falls, vitamin D deficiency, increased bone turnover, interval significant decline in bone density) and possible under- or over-estimation of fracture risk by FRAX. All patients should ensure an adequate intake of dietary calcium (1200 mg/d) and vitamin D (800 IU daily) unless contraindicated. FOLLOW-UP: People with diagnosed cases of osteoporosis or osteopenia should be regularly tested for bone mineral density. For patients eligible for Medicare, routine testing is allowed once every 2 years. The testing frequency can be increased to one year for patients  who have rapidly progressing disease, or for those who are receiving medical therapy to restore bone mass. I have reviewed this study and agree with the findings. [facility] Electronically Signed   By: Elmer Picker M.D.   On: 05/11/2021 08:42   DG CHEST PORT 1 VIEW  Result Date: 05/13/2021 CLINICAL DATA:  Placement of right chest port EXAM: PORTABLE CHEST 1 VIEW COMPARISON:  07/12/2020 FINDINGS: Cardiac size is within normal limits. There is interval appearance of patchy infiltrates in the parahilar regions and lower lung fields suggesting possible multifocal pneumonia. Possibility of underlying scarring is not excluded. There is no significant pleural effusion or pneumothorax. There is interval placement of right subclavian chest port with its tip in the superior vena cava. There is no significant pleural effusion or pneumothorax. Surgical clips are seen in the right chest wall and right axilla. IMPRESSION: Interval appearance of extensive patchy infiltrates in both lungs, more so in the lower lung fields suggesting possible multifocal pneumonia. Electronically Signed   By: Elmer Picker M.D.   On: 05/13/2021 09:03   DG C-Arm 1-60 Min  Result Date:  05/13/2021 CLINICAL DATA:  Fluoroscopic assistance for placement of chest port EXAM: DG C-ARM 1-60 MIN FLUOROSCOPY TIME:  Fluoroscopy Time:  12 seconds Number of Acquired Spot Images: 1 COMPARISON:  None. FINDINGS: Fluoroscopic assistance was provided for placement of right chest port IMPRESSION: Fluoroscopic assistance was provided for placement of right chest port. Electronically Signed   By: Elmer Picker M.D.   On: 05/13/2021 08:43   ECHOCARDIOGRAM COMPLETE  Result Date: 05/19/2021    ECHOCARDIOGRAM REPORT   Patient Name:   Aaron SPIVACK Sr. Date of Exam: 05/19/2021 Medical Rec #:  [id number]          Height:       72.0 in Accession #:    [id number]         Weight:       198.0 lb Date of Birth:  [birth year]         BSA:          2.121 m Patient Age:    32 years           BP:           153/87 mmHg Patient Gender: M                  HR:           63 bpm. Exam Location:  Outpatient Procedure: 3D Echo, 2D Echo, Cardiac Doppler, Color Doppler and Strain Analysis Indications:    Z51.11 Encounter for antineoplastic chemotheraphy  History:        Patient has prior history of Echocardiogram examinations, most                 recent 06/20/2018. COPD, Signs/Symptoms:Chest Pain; Risk                 Factors:Hypertension, Diabetes, Dyslipidemia and Current Smoker.                 Lymphoma.  Sonographer:    Roseanna Rainbow RDCS Referring Phys: [place] Comments: Technically difficult study due to poor echo windows. Image acquisition challenging due to COPD and Image acquisition challenging due to respiratory motion. IMPRESSIONS  1. Left ventricular ejection fraction by 3D volume is 59 %. The left ventricle has normal function. The left ventricle has no regional wall motion abnormalities. Left ventricular diastolic parameters are consistent  with Grade I diastolic dysfunction (impaired relaxation). The average left ventricular global longitudinal strain is -22.2 %. The global longitudinal strain is  normal.  2. Right ventricular systolic function is normal. The right ventricular size is normal.  3. The mitral valve is normal in structure. No evidence of mitral valve regurgitation. No evidence of mitral stenosis.  4. The aortic valve is normal in structure. Aortic valve regurgitation is trivial. No aortic stenosis is present.  5. The inferior vena cava is normal in size with greater than 50% respiratory variability, suggesting right atrial pressure of 3 mmHg. Comparison(s): No significant change from prior study. Prior images reviewed side by side. FINDINGS  Left Ventricle: Left ventricular ejection fraction by 3D volume is 59 %. The left ventricle has normal function. The left ventricle has no regional wall motion abnormalities. The average left ventricular global longitudinal strain is -22.2 %. The global  longitudinal strain is normal. The left ventricular internal cavity size was normal in size. There is no left ventricular hypertrophy. Left ventricular diastolic parameters are consistent with Grade I diastolic dysfunction (impaired relaxation). Right Ventricle: The right ventricular size is normal. No increase in right ventricular wall thickness. Right ventricular systolic function is normal. Left Atrium: Left atrial size was normal in size. Right Atrium: Right atrial size was normal in size. Pericardium: There is no evidence of pericardial effusion. Mitral Valve: The mitral valve is normal in structure. No evidence of mitral valve regurgitation. No evidence of mitral valve stenosis. Tricuspid Valve: The tricuspid valve is normal in structure. Tricuspid valve regurgitation is not demonstrated. No evidence of tricuspid stenosis. Aortic Valve: The aortic valve is normal in structure. Aortic valve regurgitation is trivial. No aortic stenosis is present. Aortic valve mean gradient measures 3.0 mmHg. Aortic valve peak gradient measures 5.5 mmHg. Aortic valve area, by VTI measures 3.22 cm. Pulmonic Valve: The  pulmonic valve was normal in structure. Pulmonic valve regurgitation is not visualized. No evidence of pulmonic stenosis. Aorta: The aortic root is normal in size and structure. Venous: The inferior vena cava is normal in size with greater than 50% respiratory variability, suggesting right atrial pressure of 3 mmHg. IAS/Shunts: No atrial level shunt detected by color flow Doppler.  LEFT VENTRICLE PLAX 2D LVIDd:         4.30 cm         Diastology LVIDs:         2.90 cm         LV e' medial:    4.24 cm/s LV PW:         1.05 cm         LV E/e' medial:  15.3 LV IVS:        1.10 cm         LV e' lateral:   7.18 cm/s LVOT diam:     2.40 cm         LV E/e' lateral: 9.1 LV SV:         76 LV SV Index:   36              2D LVOT Area:     4.52 cm        Longitudinal                                Strain  2D Strain GLS  -22.2 % LV Volumes (MOD)               Avg: LV vol d, MOD    76.2 ml A2C:                           3D Volume EF LV vol d, MOD    94.4 ml       LV 3D EF:    Left A4C:                                        ventricul LV vol s, MOD    32.2 ml                    ar A2C:                                        ejection LV vol s, MOD    35.2 ml                    fraction A4C:                                        by 3D LV SV MOD A2C:   44.0 ml                    volume is LV SV MOD A4C:   94.4 ml                    59 %. LV SV MOD BP:    52.3 ml                                 3D Volume EF:                                3D EF:        59 %                                LV EDV:       143 ml                                LV ESV:       59 ml                                LV SV:        84 ml RIGHT VENTRICLE             IVC RV S prime:     11.60 cm/s  IVC diam: 1.80 cm TAPSE (M-mode): 1.5 cm LEFT ATRIUM             Index        RIGHT ATRIUM  Index LA diam:        2.90 cm 1.37 cm/m   RA Area:     10.10 cm LA Vol (A2C):   36.7 ml 17.30 ml/m  RA Volume:   19.80 ml  9.33 ml/m LA  Vol (A4C):   26.8 ml 12.63 ml/m LA Biplane Vol: 32.5 ml 15.32 ml/m  AORTIC VALVE AV Area (Vmax):    3.41 cm AV Area (Vmean):   3.52 cm AV Area (VTI):     3.22 cm AV Vmax:           117.00 cm/s AV Vmean:          71.700 cm/s AV VTI:            0.236 m AV Peak Grad:      5.5 mmHg AV Mean Grad:      3.0 mmHg LVOT Vmax:         88.30 cm/s LVOT Vmean:        55.800 cm/s LVOT VTI:          0.168 m LVOT/AV VTI ratio: 0.71  AORTA Ao Root diam: 4.00 cm Ao Asc diam:  3.80 cm MITRAL VALVE MV Area (PHT): 2.73 cm    SHUNTS MV Decel Time: 278 msec    Systemic VTI:  0.17 m MV E velocity: 65.00 cm/s  Systemic Diam: 2.40 cm MV A velocity: 80.50 cm/s MV E/A ratio:  0.81 Candee Furbish MD Electronically signed by Candee Furbish MD Signature Date/Time: 05/19/2021/10:37:57 AM    Final    [place] AXILLA RIGHT  Result Date: 06/01/2021 CLINICAL DATA:  Right axillary abscess. EXAM: ULTRASOUND OF THE RIGHT AXILLA COMPARISON:  Chest CTA dated 05/29/2021. FINDINGS: Ultrasound is performed, showing an irregular fluid collection in the right axilla measuring 8.4 x 5.9 x 1.9 cm. This contains some thin internal septations. This also has some internal solid appearing echogenic components or invaginations of fat. IMPRESSION: 8.4 x 5.9 x 1.9 cm irregular, mildly complicated fluid collection in the right axilla. This is compatible with the patient's known abscess. Electronically Signed   By: Claudie Revering M.D.   On: 06/01/2021 10:13   ECHO TEE  Result Date: 06/06/2021    TRANSESOPHOGEAL ECHO REPORT   Patient Name:   Aaron MINNER Sr. Date of Exam: 06/06/2021 Medical Rec #:  [id number]          Height:       72.0 in Accession #:    [id number]         Weight:       177.5 lb Date of Birth:  [birth year]         BSA:          2.025 m Patient Age:    39 years           BP:           123/87 mmHg Patient Gender: M                  HR:           79 bpm. Exam Location:  Inpatient Procedure: Transesophageal Echo and Color Doppler Indications:     Bacteremia   History:         Patient has prior history of Echocardiogram examinations, most                  recent 05/31/2021. Abnormal ECG, COPD, Arrythmias:Atrial  Fibrillation, Signs/Symptoms:Syncope and Bacteremia; Risk                  Factors:Hypertension, Diabetes, Dyslipidemia and Current                  Smoker. Covid positive.  Sonographer:     Roseanna Rainbow RDCS Referring Phys:  [place] Diagnosing Phys: Kirk Ruths MD  Sonographer Comments: Technically difficult study due to poor echo windows. PROCEDURE: After discussion of the risks and benefits of a TEE, an informed consent was obtained from the patient. The transesophogeal probe was passed without difficulty through the esophogus of the patient. Imaged were obtained with the patient in a left lateral decubitus position. Sedation performed by different physician. The patient was monitored while under deep sedation. Anesthestetic sedation was provided intravenously by Anesthesiology: 150m of Propofol, 65mof Lidocaine. The patient's vital signs; including heart rate, blood pressure, and oxygen saturation; remained stable throughout the procedure. The patient developed no complications during the procedure. IMPRESSIONS  1. No vegetations; moderately dilated aortic root; suggest CTA or MRA to further assess.  2. Left ventricular ejection fraction, by estimation, is 55 to 60%. The left ventricle has normal function. The left ventricle has no regional wall motion abnormalities.  3. Right ventricular systolic function is normal. The right ventricular size is normal.  4. No left atrial/left atrial appendage thrombus was detected.  5. The mitral valve is normal in structure. Trivial mitral valve regurgitation.  6. The aortic valve is tricuspid. Aortic valve regurgitation is trivial.  7. Aortic dilatation noted. There is moderate dilatation of the aortic root, measuring 46 mm. FINDINGS  Left Ventricle: Left ventricular ejection fraction, by  estimation, is 55 to 60%. The left ventricle has normal function. The left ventricle has no regional wall motion abnormalities. The left ventricular internal cavity size was normal in size. Right Ventricle: The right ventricular size is normal. Right ventricular systolic function is normal. Left Atrium: Left atrial size was normal in size. No left atrial/left atrial appendage thrombus was detected. Right Atrium: Right atrial size was normal in size. Pericardium: There is no evidence of pericardial effusion. Mitral Valve: The mitral valve is normal in structure. Trivial mitral valve regurgitation. Tricuspid Valve: The tricuspid valve is normal in structure. Tricuspid valve regurgitation is trivial. Aortic Valve: The aortic valve is tricuspid. Aortic valve regurgitation is trivial. Pulmonic Valve: The pulmonic valve was normal in structure. Pulmonic valve regurgitation is trivial. Aorta: Aortic dilatation noted. There is moderate dilatation of the aortic root, measuring 46 mm. IAS/Shunts: No atrial level shunt detected by color flow Doppler. Additional Comments: No vegetations; moderately dilated aortic root; suggest CTA or MRA to further assess. BrKirk RuthsD Electronically signed by BrKirk RuthsD Signature Date/Time: 06/06/2021/1:17:06 PM    Final    ECHOCARDIOGRAM LIMITED  Result Date: 05/31/2021    ECHOCARDIOGRAM LIMITED REPORT   Patient Name:   Aaron KANNr. Date of Exam: 05/31/2021 Medical Rec #:  [id number]        Height:       72.0 in Accession #:    [id number]       Weight:       177.5 lb Date of Birth:  [birth year]       BSA:          2.025 m Patient Age:    [age]           BP:  112/79 mmHg Patient Gender: M                  HR:           82 bpm. Exam Location:  Inpatient Procedure: Limited Echo and Cardiac Doppler Indications:    Bacteremia R78.81  History:        Patient has prior history of Echocardiogram examinations, most                 recent 05/19/2021. COPD; Risk  Factors:Current Smoker,                 Hypertension, Dyslipidemia and Diabetes. COVID 19. Lymphoma.                 GERD.  Sonographer:    Darlina Sicilian RDCS Referring Phys: [place]  1. Left ventricular ejection fraction, by estimation, is 55%. The left ventricle has normal function. The left ventricle has no regional wall motion abnormalities.  2. Right ventricular systolic function is normal. The right ventricular size is normal.  3. The mitral valve is normal in structure. No evidence of mitral valve regurgitation.  4. The aortic valve was not well visualized. Aortic valve regurgitation is not visualized.  5. Limited study for evalation of infective endocarditis. No valve lesions seen, but aortic valve and pulmonic valve note well visualized. Comparison(s): No significant change from prior study. Conclusion(s)/Recommendation(s): No evidence of valvular vegetations on this transthoracic echocardiogram. Consider a transesophageal echocardiogram to exclude infective endocarditis if clinically indicated. FINDINGS  Left Ventricle: Left ventricular ejection fraction, by estimation, is 55%. The left ventricle has normal function. The left ventricle has no regional wall motion abnormalities. Right Ventricle: The right ventricular size is normal. Right ventricular systolic function is normal. Mitral Valve: The mitral valve is normal in structure. Tricuspid Valve: The tricuspid valve is normal in structure. Tricuspid valve regurgitation is not demonstrated. No evidence of tricuspid stenosis. Aortic Valve: The aortic valve was not well visualized. Aortic valve regurgitation is not visualized. Pulmonic Valve: The pulmonic valve was not well visualized. Pulmonic valve regurgitation is not visualized. No evidence of pulmonic stenosis. Rudean Haskell MD Electronically signed by Rudean Haskell MD Signature Date/Time: 05/31/2021/3:41:42 PM    Final     Labs:  CBC: Recent Labs     06/02/21 0844 06/03/21 0408 06/04/21 0403 06/06/21 0358  WBC 5.4 6.0 5.9 7.1  HGB 10.2* 10.0* 10.7* 11.0*  HCT 30.0* 30.2* 32.1* 31.1*  PLT 107* 127* 164 205    COAGS: Recent Labs    07/07/20 1423 05/29/21 1538  INR 1.0 1.2  APTT 34  --     BMP: Recent Labs    06/03/21 0408 06/04/21 0403 06/06/21 0753 06/07/21 0812  NA 135 136 130* 130*  K 3.7 3.3* 4.2 5.0  CL 107 105 96* 94*  CO2 '22 25 25 29  ' GLUCOSE 262* 243* 312* 293*  BUN '15 11 12 13  ' CALCIUM 7.0* 7.5* 8.3* 8.7*  CREATININE 0.77 0.80 0.68 0.83  GFRNONAA >60 >60 >60 >60    LIVER FUNCTION TESTS: Recent Labs    06/03/21 0408 06/04/21 0403 06/06/21 0753 06/07/21 0812  BILITOT 0.6 0.7 0.8 0.9  AST 50* 27 31 41  ALT 103* 76* 52* 54*  ALKPHOS 72 84 86 85  PROT 5.4* 5.7* 6.0* 6.6  ALBUMIN 2.3* 2.6* 2.8* 2.8*    TUMOR MARKERS: No results for input(s): AFPTM, CEA, CA199, CHROMGRNA in the last 8760 hours.  Assessment and Plan: 67 y.o. male , ex smoker, with PMH sig for AAA with prior stent grafting, anxiety/depression, COPD, HLD, GERD, DM recently admitted with MRSA bacteremia, PAF, urinary retention, AKI, COVID -36. He also has hx DLBCL with prior pancytopenia. Request received from oncology for CT guided bone marrow biopsy to rule out marrow involvement.Risks and benefits of procedure was discussed with the patient  including, but not limited to bleeding, infection, damage to adjacent structures or low yield requiring additional tests.  All of the questions were answered and there is agreement to proceed.  Consent signed and in chart. Procedure scheduled for 2/1.    Thank you for this interesting consult.  I greatly enjoyed meeting Aaron SPIELMANN Sr. and look forward to participating in their care.  A copy of this report was sent to the requesting provider on this date.  Electronically Signed: D. Rowe Robert, PA-C 06/07/2021, 10:53 AM

## 2021-06-07 NOTE — Plan of Care (Signed)
  Problem: Education: Goal: Knowledge of General Education information will improve Description: Including pain rating scale, medication(s)/side effects and non-pharmacologic comfort measures Outcome: Progressing   Problem: Clinical Measurements: Goal: Will remain free from infection Outcome: Progressing   Problem: Clinical Measurements: Goal: Diagnostic test results will improve Outcome: Progressing   

## 2021-06-07 NOTE — Progress Notes (Signed)
Pharmacy: [facility] Good Rx Coupon  28 YOM with MRSA bacteremia in setting of port-a-cath and R-axilla abscess. Port-a-cath removed and abscess drained on 1/25, blood cultures cleared on 1/24, TTE/TEE negative for vegetations. The patient has completed >1 week of Vancomycin and the plan is to complete therapy with orals.   The patient's Zyvox copay is $100 however is around $65 with GoodRx. The patient has utilized [facility]  on [address] previously - who has #28 tablets in stock. Good Rx coupon copied below but will also be provided to the family.      Plan discussed with the patient and his wife Aaron Weaver)  Thank you for allowing pharmacy to be a part of this patients care.  Alycia Rossetti, PharmD, BCPS Clinical Pharmacist 06/07/2021 2:51 PM   **Pharmacist phone directory can now be found on [email or web address] ([email or web address]).  Listed under [place].

## 2021-06-07 NOTE — Progress Notes (Signed)
[facility] for Infectious Disease  Date of Admission:  05/29/2021     Lines: 1/06-1/25 port-a-cath right chest   Abx: 1/22-c vancomycin   1/23-27 remdesivir 1/22-23 cefepime   A/p Mrsa bacteremia Clabsi -- Port-a-cath removed 1/25 Right axillary abscess s/p I&D 1/25 Mild covid infection History of AAA endovascular stent repair 07/2020 12/21 right axillary lymph node excision biopsy Lymphoma recent chemo initiation mid 05/2021 Chemo related pancytopenia -- resolved   1/22 bcx 1 of 2 set positive mrsa (S tetra, bactrim) 1/24 bcx negative 1/25 right axillary surgical tissue cx mrsa 1/25 port-a-cath tip mrsa/staph hominis   Tte no valve vegetation. Tee no vegetation    Plan: -continue iv abx while inpatient -on discharge can give 2 weeks of linezolid 600 mg po bid and then 1 week doxycycline 100 mg po bid to finish 4 week of treatment -port/picc placement for chemo up to oncology team - consider left side placement if port planned. 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 ([place])   Pancytopenia ([place])   COVID-19 virus infection   AKI (acute kidney injury) ([place])   Acute urinary retention   Diarrhea   Axillary tenderness, right   Atrial fibrillation with RVR (HCC)   MRSA bacteremia   Allergies  Allergen Reactions   Fexofenadine Other (See Comments)    "dries me out too much"   Hydrochlorothiazide Other (See Comments)    REACTION: cramps   Penicillins Hives   Pravastatin Sodium Other  (See Comments)    REACTION: aches   Requip [Ropinirole Hcl] Other (See Comments)    Bad dreams    Scheduled Meds:  arformoterol  15 mcg Nebulization BID   aspirin EC  81 mg Oral Q0600   atenolol  100 mg Oral Daily   Chlorhexidine Gluconate Cloth  6 each Topical Daily   DULoxetine  60 mg Oral QHS   enoxaparin (LOVENOX) injection  40 mg Subcutaneous Q24H   famciclovir  250 mg Oral BID   feeding supplement (NEPRO CARB STEADY)  237 mL Oral BID BM   insulin aspart  0-15 Units Subcutaneous TID WC   insulin aspart  0-5 Units Subcutaneous QHS   insulin aspart  6 Units Subcutaneous TID WC   insulin glargine-yfgn  16 Units Subcutaneous QHS   multivitamin with minerals  1 tablet Oral Daily   pantoprazole  40 mg Oral Daily   tamsulosin  0.4 mg Oral QPM   Continuous Infusions:  vancomycin 1,000 mg (06/07/21 1010)   PRN Meds:.acetaminophen **OR** acetaminophen, HYDROmorphone (DILAUDID) injection, melatonin, ondansetron **OR** ondansetron (ZOFRAN) IV, oxyCODONE, traMADol   SUBJECTIVE: Doing well no focal pain Right axillary drain still in No f/c No n/v/diarrhea Pending bone marrow biopsy for lymphoma staging  Tee negative for endocarditis  Review of Systems: ROS All other ROS was negative, except mentioned above     OBJECTIVE: Vitals:   06/06/21 2050 06/07/21 0428 06/07/21 0903 06/07/21 1142  BP:  124/76  121/80  Pulse:  77  73  Resp:  16  16  Temp:  98.6 F (37 C)  99.1 F (37.3 C)  TempSrc:  Oral  Oral  SpO2: 96% 93% 93% 93%  Weight:      Height:       Body mass index is 24.07 kg/m.  Physical Exam  General/constitutional: no distress, pleasant HEENT: Normocephalic, PER, Conj Clear, EOMI, Oropharynx clear Neck supple CV: rrr no mrg Lungs: clear to auscultation, normal respiratory effort Abd: Soft, Nontender Ext: no edema  Neuro: nonfocal  MSK/SKIN: right axilla dressign clean/dryl; no surrounding erythema/fluctuance; drain in place with purulence  output/clear serous fluid mix   Lab Results Lab Results  Component Value Date   WBC 7.1 06/06/2021   HGB 11.0 (L) 06/06/2021   HCT 31.1 (L) 06/06/2021   MCV 81.2 06/06/2021   PLT 205 06/06/2021    Lab Results  Component Value Date   CREATININE 0.83 06/07/2021   BUN 13 06/07/2021   NA 130 (L) 06/07/2021   K 5.0 06/07/2021   CL 94 (L) 06/07/2021   CO2 29 06/07/2021    Lab Results  Component Value Date   ALT 54 (H) 06/07/2021   AST 41 06/07/2021   ALKPHOS 85 06/07/2021   BILITOT 0.9 06/07/2021      Microbiology: Recent Results (from the past 240 hour(s))  Culture, blood (Routine x 2)     Status: Abnormal   Collection Time: 05/29/21  3:38 PM   Specimen: BLOOD  Result Value Ref Range Status   Specimen Description   Final    BLOOD RIGHT ANTECUBITAL Performed at [facility], [place] [address] [place], [place] [address]    Special Requests   Final    BOTTLES DRAWN AEROBIC AND ANAEROBIC Blood Culture adequate volume Performed at [facility] [address] [place], [place] [address]    Culture  Setup Time   Final    GRAM POSITIVE COCCI IN CLUSTERS AEROBIC BOTTLE ONLY CRITICAL RESULT CALLED TO, READ BACK BY AND VERIFIED WITH: Damian Leavell [id number] AT 1559 BY CM Performed at [facility], [place] [address] [place], [place] [address]    Culture METHICILLIN RESISTANT STAPHYLOCOCCUS AUREUS (A)  Final   Report Status 06/01/2021 FINAL  Final   Organism ID, Bacteria METHICILLIN RESISTANT STAPHYLOCOCCUS AUREUS  Final      Susceptibility   Methicillin resistant staphylococcus aureus - MIC*    CIPROFLOXACIN >=8 RESISTANT Resistant     ERYTHROMYCIN >=8 RESISTANT Resistant     GENTAMICIN <=0.5 SENSITIVE Sensitive     OXACILLIN >=4 RESISTANT Resistant     TETRACYCLINE <=1 SENSITIVE Sensitive     VANCOMYCIN 1 SENSITIVE Sensitive     TRIMETH/SULFA <=10 SENSITIVE Sensitive     CLINDAMYCIN <=0.25 SENSITIVE Sensitive     RIFAMPIN <=0.5  SENSITIVE Sensitive     Inducible Clindamycin NEGATIVE Sensitive     * METHICILLIN RESISTANT STAPHYLOCOCCUS AUREUS  Blood Culture ID Panel (Reflexed)     Status: Abnormal   Collection Time: 05/29/21  3:38 PM  Result Value Ref Range Status   Enterococcus faecalis NOT DETECTED NOT DETECTED Final   Enterococcus Faecium NOT DETECTED NOT DETECTED Final   Listeria monocytogenes NOT DETECTED NOT DETECTED Final   Staphylococcus species DETECTED (A) NOT DETECTED Final    Comment: CRITICAL RESULT CALLED TO, READ BACK BY AND VERIFIED WITH: PHARMD J GADHIA [phone] AT 1300 BY CM    Staphylococcus aureus (BCID) DETECTED (A) NOT DETECTED Final  Comment: Methicillin (oxacillin)-resistant Staphylococcus aureus (MRSA). MRSA is predictably resistant to beta-lactam antibiotics (except ceftaroline). Preferred therapy is vancomycin unless clinically contraindicated. Patient requires contact precautions if  hospitalized. CRITICAL RESULT CALLED TO, READ BACK BY AND VERIFIED WITH: PHARMD J GADHIA [id number] AT 1300 BY CM    Staphylococcus epidermidis NOT DETECTED NOT DETECTED Final   Staphylococcus lugdunensis NOT DETECTED NOT DETECTED Final   Streptococcus species NOT DETECTED NOT DETECTED Final   Streptococcus agalactiae NOT DETECTED NOT DETECTED Final   Streptococcus pneumoniae NOT DETECTED NOT DETECTED Final   Streptococcus pyogenes NOT DETECTED NOT DETECTED Final   A.calcoaceticus-baumannii NOT DETECTED NOT DETECTED Final   Bacteroides fragilis NOT DETECTED NOT DETECTED Final   Enterobacterales NOT DETECTED NOT DETECTED Final   Enterobacter cloacae complex NOT DETECTED NOT DETECTED Final   Escherichia coli NOT DETECTED NOT DETECTED Final   Klebsiella aerogenes NOT DETECTED NOT DETECTED Final   Klebsiella oxytoca NOT DETECTED NOT DETECTED Final   Klebsiella pneumoniae NOT DETECTED NOT DETECTED Final   Proteus species NOT DETECTED NOT DETECTED Final   Salmonella species NOT DETECTED NOT DETECTED Final    Serratia marcescens NOT DETECTED NOT DETECTED Final   Haemophilus influenzae NOT DETECTED NOT DETECTED Final   Neisseria meningitidis NOT DETECTED NOT DETECTED Final   Pseudomonas aeruginosa NOT DETECTED NOT DETECTED Final   Stenotrophomonas maltophilia NOT DETECTED NOT DETECTED Final   Candida albicans NOT DETECTED NOT DETECTED Final   Candida auris NOT DETECTED NOT DETECTED Final   Candida glabrata NOT DETECTED NOT DETECTED Final   Candida krusei NOT DETECTED NOT DETECTED Final   Candida parapsilosis NOT DETECTED NOT DETECTED Final   Candida tropicalis NOT DETECTED NOT DETECTED Final   Cryptococcus neoformans/gattii NOT DETECTED NOT DETECTED Final   Meth resistant mecA/C and MREJ DETECTED (A) NOT DETECTED Final    Comment: CRITICAL RESULT CALLED TO, READ BACK BY AND VERIFIED WITH: Damian Leavell [id number] AT 1300 BY CM Performed at [facility], [address] [place], [place] [address]   Culture, blood (Routine x 2)     Status: None   Collection Time: 05/29/21  3:43 PM   Specimen: BLOOD  Result Value Ref Range Status   Specimen Description   Final    BLOOD LEFT HAND Performed at [facility] [address], [place], [place] [address]    Special Requests   Final    BOTTLES DRAWN AEROBIC AND ANAEROBIC Blood Culture adequate volume Performed at [facility] [address], [place], [place] [address]    Culture   Final    NO GROWTH 5 DAYS Performed at [facility], [place] [address], [place], [place] [address]    Report Status 06/04/2021 FINAL  Final  Resp Panel by RT-PCR (Flu A&B, Covid)     Status: Abnormal   Collection Time: 05/29/21  7:19 PM   Specimen: Nasopharyngeal(NP) swabs in vial transport medium  Result Value Ref Range Status   SARS Coronavirus 2 by RT PCR POSITIVE (A) NEGATIVE Final    Comment: (NOTE) SARS-CoV-2 target nucleic acids are DETECTED.  The SARS-CoV-2 RNA is generally detectable in upper  respiratory specimens during the acute phase of infection. Positive results are indicative of the presence of the identified virus, but do not rule out bacterial infection or co-infection with other pathogens not detected by the test. Clinical correlation with patient history and other diagnostic information is necessary to determine patient infection status. The expected result is Negative.  Fact Sheet for Patients: EntrepreneurPulse.com.au  Fact Sheet for Healthcare Providers: IncredibleEmployment.be  This test is not yet approved or cleared by the [place] FDA and  has been authorized for detection and/or diagnosis of SARS-CoV-2 by FDA under an Emergency Use Authorization (EUA).  This EUA will remain in effect (meaning this test can be used) for the duration of  the COVID-19 declaration under Section 564(b)(1) of the A ct, 21 U.S.C. section 360bbb-3(b)(1), unless the authorization is terminated or revoked sooner.     Influenza A by PCR NEGATIVE NEGATIVE Final   Influenza B by PCR NEGATIVE NEGATIVE Final    Comment: (NOTE) The Xpert Xpress SARS-CoV-2/FLU/RSV plus assay is intended as an aid in the diagnosis of influenza from Nasopharyngeal swab specimens and should not be used as a sole basis for treatment. Nasal washings and aspirates are unacceptable for Xpert Xpress SARS-CoV-2/FLU/RSV testing.  Fact Sheet for Patients: EntrepreneurPulse.com.au  Fact Sheet for Healthcare Providers: IncredibleEmployment.be  This test is not yet approved or cleared by the [place] FDA and has been authorized for detection and/or diagnosis of SARS-CoV-2 by FDA under an Emergency Use Authorization (EUA). This EUA will remain in effect (meaning this test can be used) for the duration of the COVID-19 declaration under Section 564(b)(1) of the Act, 21 U.S.C. section 360bbb-3(b)(1), unless the authorization is  terminated or revoked.  Performed at [facility], [place] [address] [place], [place] [address]   C Difficile Quick Screen w PCR reflex     Status: None   Collection Time: 05/30/21  8:42 AM   Specimen: STOOL  Result Value Ref Range Status   C Diff antigen NEGATIVE NEGATIVE Final   C Diff toxin NEGATIVE NEGATIVE Final   C Diff interpretation No C. difficile detected.  Final    Comment: Performed at [facility], [place] [address] [place], [place] [address]  Urine Culture     Status: None   Collection Time: 05/30/21  1:44 PM   Specimen: Urine, Catheterized  Result Value Ref Range Status   Specimen Description   Final    URINE, CATHETERIZED Performed at [facility] [address], [place], [place] [address]    Special Requests   Final    NONE Performed at [facility], [place] [address], [place], [place] [address]    Culture   Final    NO GROWTH Performed at [facility], [place] [address] [place], [place] [address]    Report Status 05/31/2021 FINAL  Final  Culture, blood (routine x 2)     Status: None   Collection Time: 05/31/21 11:07 PM   Specimen: BLOOD RIGHT ARM  Result Value Ref Range Status   Specimen Description   Final    BLOOD RIGHT ARM Performed at [facility] [address], [place], [place] [address]    Special Requests   Final    BOTTLES DRAWN AEROBIC ONLY Blood Culture adequate volume Performed at [facility] [address], [place], [place] [address]    Culture   Final    NO GROWTH 5 DAYS Performed at [facility], [place] [address] [place], [place] [address]    Report Status 06/06/2021 FINAL  Final  Culture, blood (routine x 2)     Status: None   Collection Time: 05/31/21 11:07 PM   Specimen: BLOOD RIGHT WRIST  Result Value Ref Range Status   Specimen Description   Final  BLOOD RIGHT WRIST Performed at [facility] [address], [place], [place] [address]    Special Requests   Final    BOTTLES DRAWN AEROBIC ONLY Blood Culture adequate volume Performed at [facility] [address], [place], [place] [address]    Culture   Final    NO GROWTH 5 DAYS Performed at [facility], [place] [address], [place], [place] [address]    Report Status 06/06/2021 FINAL  Final  Aerobic Culture w Gram Stain (superficial specimen)     Status: None   Collection Time: 06/01/21  3:20 PM   Specimen: Chest; Abscess  Result Value Ref Range Status   Specimen Description   Final    ABSCESS CHEST RIGHT PORTA CATH Performed at [facility] [address], [place], [place] [address]    Special Requests   Final    NONE Performed at [facility], [place] [address], [place], [place] [address]    Gram Stain   Final    FEW WBC PRESENT, PREDOMINANTLY MONONUCLEAR NO ORGANISMS SEEN Performed at [facility], [place] [address], [place], [place] [address]    Culture   Final    RARE METHICILLIN RESISTANT STAPHYLOCOCCUS AUREUS RARE STAPHYLOCOCCUS HOMINIS    Report Status 06/04/2021 FINAL  Final   Organism ID, Bacteria METHICILLIN RESISTANT STAPHYLOCOCCUS AUREUS  Final   Organism ID, Bacteria STAPHYLOCOCCUS HOMINIS  Final      Susceptibility   Methicillin resistant staphylococcus aureus - MIC*    CIPROFLOXACIN >=8 RESISTANT Resistant     ERYTHROMYCIN >=8 RESISTANT Resistant     GENTAMICIN <=0.5 SENSITIVE Sensitive     OXACILLIN >=4 RESISTANT Resistant     TETRACYCLINE <=1 SENSITIVE Sensitive     VANCOMYCIN <=0.5 SENSITIVE Sensitive     TRIMETH/SULFA <=10 SENSITIVE Sensitive     CLINDAMYCIN <=0.25 SENSITIVE Sensitive     RIFAMPIN <=0.5 SENSITIVE Sensitive     Inducible Clindamycin NEGATIVE Sensitive     * RARE METHICILLIN RESISTANT STAPHYLOCOCCUS AUREUS   Staphylococcus hominis - MIC*    CIPROFLOXACIN <=0.5 SENSITIVE Sensitive     ERYTHROMYCIN  <=0.25 SENSITIVE Sensitive     GENTAMICIN <=0.5 SENSITIVE Sensitive     OXACILLIN RESISTANT Resistant     TETRACYCLINE <=1 SENSITIVE Sensitive     VANCOMYCIN 1 SENSITIVE Sensitive     TRIMETH/SULFA <=10 SENSITIVE Sensitive     CLINDAMYCIN <=0.25 SENSITIVE Sensitive     RIFAMPIN <=0.5 SENSITIVE Sensitive     Inducible Clindamycin NEGATIVE Sensitive     * RARE STAPHYLOCOCCUS HOMINIS  Aerobic Culture w Gram Stain (superficial specimen)     Status: None   Collection Time: 06/01/21  3:25 PM   Specimen: Axilla, Right; Abscess  Result Value Ref Range Status   Specimen Description   Final    ABSCESS RIGHT AXILLA Performed at [facility] [address] [place], [place] [address]    Special Requests   Final    NONE Performed at [facility], [place] [address], [place], [place] [address]    Gram Stain   Final    MODERATE WBC PRESENT,BOTH PMN AND MONONUCLEAR ABUNDANT GRAM POSITIVE COCCI Performed at [facility], [place] [address] [place],  [address]    Culture   Final    ABUNDANT METHICILLIN RESISTANT STAPHYLOCOCCUS AUREUS   Report Status 06/04/2021 FINAL  Final   Organism ID, Bacteria METHICILLIN RESISTANT STAPHYLOCOCCUS AUREUS  Final  Susceptibility   Methicillin resistant staphylococcus aureus - MIC*    CIPROFLOXACIN >=8 RESISTANT Resistant     ERYTHROMYCIN >=8 RESISTANT Resistant     GENTAMICIN <=0.5 SENSITIVE Sensitive     OXACILLIN >=4 RESISTANT Resistant     TETRACYCLINE <=1 SENSITIVE Sensitive     VANCOMYCIN 1 SENSITIVE Sensitive     TRIMETH/SULFA <=10 SENSITIVE Sensitive     CLINDAMYCIN <=0.25 SENSITIVE Sensitive     RIFAMPIN <=0.5 SENSITIVE Sensitive     Inducible Clindamycin NEGATIVE Sensitive     * ABUNDANT METHICILLIN RESISTANT STAPHYLOCOCCUS AUREUS     Serology:   Imaging: If present, new imagings (plain films, ct scans, and mri) have been personally visualized and interpreted; radiology reports have been  reviewed. Decision making incorporated into the Impression / Recommendations.  1/31 tee Normal LV function; moderately dilated aortic root (4.6 cm; suggest CTA to further assess); trace AI, MR and TR; no vegetations.  Jabier Mutton, [facility] for Infectious [place] [phone] pager    06/07/2021, 2:56 PM

## 2021-06-08 ENCOUNTER — Ambulatory Visit: Payer: Medicare Other | Admitting: Hematology & Oncology

## 2021-06-08 ENCOUNTER — Inpatient Hospital Stay: Payer: Medicare Other

## 2021-06-08 ENCOUNTER — Ambulatory Visit: Payer: Medicare Other

## 2021-06-08 ENCOUNTER — Inpatient Hospital Stay (HOSPITAL_COMMUNITY): Payer: Medicare Other

## 2021-06-08 LAB — CBC WITH DIFFERENTIAL/PLATELET
Abs Immature Granulocytes: 0.52 10*3/uL — ABNORMAL HIGH (ref 0.00–0.07)
Basophils Absolute: 0.1 10*3/uL (ref 0.0–0.1)
Basophils Relative: 2 %
Eosinophils Absolute: 0 10*3/uL (ref 0.0–0.5)
Eosinophils Relative: 0 %
HCT: 35.5 % — ABNORMAL LOW (ref 39.0–52.0)
Hemoglobin: 11.9 g/dL — ABNORMAL LOW (ref 13.0–17.0)
Immature Granulocytes: 7 %
Lymphocytes Relative: 7 %
Lymphs Abs: 0.5 10*3/uL — ABNORMAL LOW (ref 0.7–4.0)
MCH: 28.2 pg (ref 26.0–34.0)
MCHC: 33.5 g/dL (ref 30.0–36.0)
MCV: 84.1 fL (ref 80.0–100.0)
Monocytes Absolute: 1.3 10*3/uL — ABNORMAL HIGH (ref 0.1–1.0)
Monocytes Relative: 18 %
Neutro Abs: 4.9 10*3/uL (ref 1.7–7.7)
Neutrophils Relative %: 66 %
Platelets: 282 10*3/uL (ref 150–400)
RBC: 4.22 MIL/uL (ref 4.22–5.81)
RDW: 15 % (ref 11.5–15.5)
WBC: 7.3 10*3/uL (ref 4.0–10.5)
nRBC: 0.5 % — ABNORMAL HIGH (ref 0.0–0.2)

## 2021-06-08 LAB — COMPREHENSIVE METABOLIC PANEL
ALT: 52 U/L — ABNORMAL HIGH (ref 0–44)
AST: 35 U/L (ref 15–41)
Albumin: 2.9 g/dL — ABNORMAL LOW (ref 3.5–5.0)
Alkaline Phosphatase: 86 U/L (ref 38–126)
Anion gap: 8 (ref 5–15)
BUN: 17 mg/dL (ref 8–23)
CO2: 26 mmol/L (ref 22–32)
Calcium: 8.6 mg/dL — ABNORMAL LOW (ref 8.9–10.3)
Chloride: 96 mmol/L — ABNORMAL LOW (ref 98–111)
Creatinine, Ser: 0.88 mg/dL (ref 0.61–1.24)
GFR, Estimated: >60 mL/min (ref >60)
Glucose, Bld: 270 mg/dL — ABNORMAL HIGH (ref 70–99)
Potassium: 4 mmol/L (ref 3.5–5.1)
Sodium: 130 mmol/L — ABNORMAL LOW (ref 135–145)
Total Bilirubin: 0.8 mg/dL (ref 0.3–1.2)
Total Protein: 6.5 g/dL (ref 6.5–8.1)

## 2021-06-08 LAB — GLUCOSE, CAPILLARY
Glucose-Capillary: 283 mg/dL — ABNORMAL HIGH (ref 70–99)
Glucose-Capillary: 254 mg/dL — ABNORMAL HIGH (ref 70–99)

## 2021-06-08 MED ORDER — BLOOD GLUCOSE MONITOR KIT: PACK | 0 refills | Status: AC

## 2021-06-08 MED ORDER — INSULIN LISPRO (1 UNIT DIAL) 100 UNIT/ML (KWIKPEN): 2.0000 [IU] | PEN_INJECTOR | Freq: Three times a day (TID) | SUBCUTANEOUS | 11 refills | Status: DC

## 2021-06-08 MED ORDER — SODIUM CHLORIDE 0.9 % IV SOLN
INTRAVENOUS | Status: AC
  Filled 2021-06-08: qty 250

## 2021-06-08 MED ORDER — MIDAZOLAM HCL 2 MG/2ML IJ SOLN
INTRAMUSCULAR | Status: AC
  Filled 2021-06-08: qty 4

## 2021-06-08 MED ORDER — FENTANYL CITRATE (PF) 100 MCG/2ML IJ SOLN
INTRAMUSCULAR | Status: AC
  Filled 2021-06-08: qty 2

## 2021-06-08 MED ORDER — LINEZOLID 600 MG PO TABS: 600.0000 mg | ORAL_TABLET | Freq: Two times a day (BID) | ORAL | 0 refills | Status: AC

## 2021-06-08 MED ORDER — FENTANYL CITRATE (PF) 100 MCG/2ML IJ SOLN
INTRAMUSCULAR | Status: AC | PRN
  Administered 2021-06-08 (×2): 50 ug via INTRAVENOUS

## 2021-06-08 MED ORDER — TRAMADOL HCL 50 MG PO TABS: 50.0000 mg | ORAL_TABLET | Freq: Four times a day (QID) | ORAL | 0 refills | Status: AC | PRN

## 2021-06-08 MED ORDER — INSULIN PEN NEEDLE 32G X 4 MM MISC: 1.0000 | Freq: Three times a day (TID) | 5 refills | Status: AC

## 2021-06-08 MED ORDER — DOXYCYCLINE HYCLATE 100 MG PO CAPS: 100.0000 mg | ORAL_CAPSULE | Freq: Two times a day (BID) | ORAL | 0 refills | Status: DC

## 2021-06-08 MED ORDER — INSULIN GLARGINE 100 UNIT/ML SOLOSTAR PEN: 18.0000 [IU] | PEN_INJECTOR | Freq: Every day | SUBCUTANEOUS | 11 refills | Status: DC

## 2021-06-08 MED ORDER — MIDAZOLAM HCL 2 MG/2ML IJ SOLN
INTRAMUSCULAR | Status: AC | PRN
  Administered 2021-06-08 (×2): 1 mg via INTRAVENOUS

## 2021-06-08 NOTE — TOC Progression Note (Signed)
Transition of Care (TOC) - Progression Note    Patient Details  Name: Aaron DOHRMANN Sr. MRN: [id number] Date of Birth: October 23, 1955  Transition of Care [facility]) CM/SW Contact  Purcell Mouton, RN Phone Number: 06/08/2021, 1:25 PM  Clinical Narrative:    Spoke with pt's wife Aaron Weaver concerning discharge needs. There are no HH needs or DME at present time.    Expected Discharge Plan: [place] Barriers to Discharge: No Barriers Identified  Expected Discharge Plan and Services Expected Discharge Plan: [place]   Discharge Planning Services: CM Consult Post Acute Care Choice: [place] arrangements for the past 2 months: Single Family Home Expected Discharge Date: 06/08/21                                     Social Determinants of Health (SDOH) Interventions    Readmission Risk Interventions No flowsheet data found.

## 2021-06-08 NOTE — Progress Notes (Signed)
Nutrition Follow-up  DOCUMENTATION CODES:   Not applicable  INTERVENTION:  - continue Nepro Shake BID.  - can transition to Glucerna Shake or Boost Glucose Control at d/c.     NUTRITION DIAGNOSIS:   Increased nutrient needs related to cancer and cancer related treatments, acute illness (COVID19) as evidenced by estimated needs. -ongoing  GOAL:   Patient will meet greater than or equal to 90% of their needs -minimally met on average  MONITOR:   PO intake, Supplement acceptance, Labs, Weight trends, Skin  ASSESSMENT:   66 year old male with history of COPD, GERD, lymphoma (active chemo, started January 2023), DM type 2, and HTN presented to ED after a fall/syncope at home.  Patient is currently out of the room to CT. Discharge order for d/c to home entered ~1 hour ago; no discharge summary yet.   Meal intake percentages documented since dinner on 1/24 are mainly 100%, but limited documentation available. He has been NPO since midnight and unable to have breakfast this AM.  He has been accepting Nepro Shake nearly 100% of the time offered.   He has not been weighed since admission on 1/22; re-weigh patient if he is not discharged. No information documented in the edema section of flow sheet during hospitalization.   He is s/p TEE on 1/30 which did not show any vegetations. He was experiencing R axillary tenderness and underwent I&D on 1/25. Patient out of the room for bone marrow biopsy.    Labs reviewed; CBG: 283 mg/dl, Na: 130 mmol/l, Cl: 96 mmol/l.  Medications reviewed; sliding scale novolog, 6 units novolog TID, 16 units semglee/day, 1 tablet multivitamin with minerals/day, 40 mg oral protonix/day.   NUTRITION - FOCUSED PHYSICAL EXAM:  Patient is out of the room to CT.   Diet Order:   Diet Order             Diet Carb Modified           Diet Carb Modified Fluid consistency: Thin; Room service appropriate? Yes; Fluid restriction: 1200 mL Fluid  Diet effective now                    EDUCATION NEEDS:   Education needs have been addressed  Skin:  Skin Assessment: Skin Integrity Issues: Skin Integrity Issues:: Other (Comment), Incisions Incisions: R chest (1/25) Other: abrasions 2/2 fall  Last BM:  1/31 (type 4 x1, medium amount)  Height:   Ht Readings from Last 1 Encounters:  05/29/21 6' (1.829 m)    Weight:   Wt Readings from Last 1 Encounters:  05/29/21 80.5 kg     BMI:  Body mass index is 24.07 kg/m.   Estimated Nutritional Needs:  Kcal:  2200-2500 kcal/d Protein:  120-130 g/d Fluid:  2.2-2.5 L/d      Jarome Matin, MS, RD, LDN Inpatient Clinical Dietitian RD pager # available in [place]  After hours/weekend pager # available in [facility]

## 2021-06-08 NOTE — Procedures (Signed)
Interventional Radiology Procedure Note  Procedure: CT BM ASP AND CORE    Complications: None  Estimated Blood Loss:  MIN  Findings: 11 G CORE AND ASP    M. TREVOR Sang Blount, MD    

## 2021-06-08 NOTE — Plan of Care (Signed)
PIVs removed. Pt Dc'ed upon 1 hour of bedrest after biopsy completed. DC paperwork reviewed with pt and family.   Problem: Education: Goal: Knowledge of General Education information will improve Description: Including pain rating scale, medication(s)/side effects and non-pharmacologic comfort measures Outcome: Completed/Met   Problem: Health Behavior/Discharge Planning: Goal: Ability to manage health-related needs will improve Outcome: Completed/Met   Problem: Clinical Measurements: Goal: Ability to maintain clinical measurements within normal limits will improve Outcome: Completed/Met Goal: Will remain free from infection Outcome: Completed/Met Goal: Diagnostic test results will improve Outcome: Completed/Met Goal: Respiratory complications will improve Outcome: Completed/Met Goal: Cardiovascular complication will be avoided Outcome: Completed/Met   Problem: Activity: Goal: Risk for activity intolerance will decrease Outcome: Completed/Met   Problem: Nutrition: Goal: Adequate nutrition will be maintained Outcome: Completed/Met   Problem: Coping: Goal: Level of anxiety will decrease Outcome: Completed/Met   Problem: Elimination: Goal: Will not experience complications related to bowel motility Outcome: Completed/Met Goal: Will not experience complications related to urinary retention Outcome: Completed/Met   Problem: Pain Managment: Goal: General experience of comfort will improve Outcome: Completed/Met   Problem: Safety: Goal: Ability to remain free from injury will improve Outcome: Completed/Met   Problem: Skin Integrity: Goal: Risk for impaired skin integrity will decrease Outcome: Completed/Met

## 2021-06-08 NOTE — Plan of Care (Signed)
°  Problem: Clinical Measurements: Goal: Respiratory complications will improve Outcome: Progressing   Problem: Nutrition: Goal: Adequate nutrition will be maintained Outcome: Progressing   Problem: Coping: Goal: Level of anxiety will decrease Outcome: Progressing   

## 2021-06-09 ENCOUNTER — Encounter: Payer: Self-pay | Admitting: *Deleted

## 2021-06-09 ENCOUNTER — Other Ambulatory Visit (HOSPITAL_BASED_OUTPATIENT_CLINIC_OR_DEPARTMENT_OTHER): Payer: Self-pay

## 2021-06-09 ENCOUNTER — Other Ambulatory Visit: Payer: Self-pay | Admitting: *Deleted

## 2021-06-09 DIAGNOSIS — C8338 Diffuse large B-cell lymphoma, lymph nodes of multiple sites: Secondary | ICD-10-CM

## 2021-06-09 MED ORDER — ALLOPURINOL 100 MG PO TABS
100.0000 mg | ORAL_TABLET | Freq: Every day | ORAL | 0 refills | Status: DC
  Filled 2021-06-09: qty 30, 30d supply, fill #0

## 2021-06-09 MED ORDER — FAMCICLOVIR 250 MG PO TABS
250.0000 mg | ORAL_TABLET | Freq: Two times a day (BID) | ORAL | 8 refills | Status: DC
  Filled 2021-06-09: qty 30, 15d supply, fill #0
  Filled 2021-07-25 – 2021-08-05 (×2): qty 30, 15d supply, fill #1
  Filled 2021-08-23: qty 30, 15d supply, fill #2
  Filled 2021-09-07: qty 30, 15d supply, fill #3
  Filled 2021-10-05: qty 30, 15d supply, fill #4
  Filled 2021-10-17: qty 30, 15d supply, fill #5
  Filled 2021-11-01: qty 30, 15d supply, fill #6
  Filled 2021-11-20: qty 30, 15d supply, fill #7
  Filled 2021-12-30: qty 30, 15d supply, fill #8

## 2021-06-09 NOTE — Discharge Summary (Signed)
Physician Discharge Summary   Aaron LEAZER Sr. VAN:[id number] DOB: [birth year] DOA: 05/29/2021  PCP: [name], MD  Admit date: 05/29/2021 Discharge date: 06/08/2021   Admitted From: home Disposition:  home Discharging physician: Dwyane Dee, MD  Recommendations for Outpatient Follow-up:  Follow up with oncology  Follow up bone marrow biopsy results Patient started on Lantus and sliding scale at discharge, follow-up glucose logs and adjust further as needed   Discharge Condition: stable CODE STATUS: Full Diet recommendation:  Diet Orders (From admission, onward)     Start     Ordered   06/08/21 0000  Diet Carb Modified        06/08/21 1209            Hospital Course: Aaron BRUINS Sr. Is a 66 y.o. male with a history of diabetes mellitus type 2, AAA s/p endovascular repair, COPD, lymphoma recently started chemotherapy. Patient presented secondary to fall and weakness and found to have evidence of SIRS with unknown infectious source. Empiric antibiotics started. Blood cultures significant for MRSA. Patient transitioned to Vancomycin monotherapy and ID consulted.  Severe sepsis ([place])- (present on admission) with MRSA bacteremia MRSA bacteremia source. Complicated by neutropenia in setting of recent chemotherapy. Blood cultures obtained and results confirm MRSA, without source. CT abdomen/pelvis ordered unremarkable for abscess. Started empirically on Vancomycin and Cefepime and switched to Vancomycin monotherapy. ID auto-consulted. -remains afebrile -Port removed by general surgery 1/25, MRSA noted on cath tip -TEE completed 1/30 with no vegetations seen - discharged with 2 weeks of Zyvox followed by 1 week of doxycycline per ID recommendations   Atrial fibrillation with RVR ([place]) Likely transient and related to acute illness in addition to discontinuation of atenolol. Patient was initially started on amiodarone drip and converted back to sinus rhythm -BP improved and  pt has since been transitioned off amio back to PO beta blocker -Continues to be rate controlled   Axillary tenderness, right Patient with a somewhat remote history (05/28/20) excisional lymph node biopsy, still with tenderness and mild erythema around the site. Possible source for bacteremia -Pt now I/d of abscess 1/25 -Cultures of abscess has grown MRSA -Abscess drain continued at discharge and patient will follow-up with surgery for removal   Acute urinary retention Unknown etiology at this time. Patient has required multiple I/O catheterization. Foley catheter placed on 1/23. Urine culture (1/23) with no growth, but culture obtained after antibiotics initiated. -Continue foley catheter, recommend f/u with urology after d/c; wife plans to make appointment at discharge   Pancytopenia [facility]) Secondary to recent chemotherapy. ANC of 800. Received G-CSF on 1/6. -All blood counts have been trending up   AKI (acute kidney injury) ([place]) Baseline creatinine of about 0.8-0.9. 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 Marin Olp and is on chemotherapy. First cycle started 1/13 and he received G-CSF on 1/6 -Continue Famvir for shingles prophylaxis -Bone marrow biopsy planned for 2/1.  Follow-up results outpatient   COPD (chronic obstructive pulmonary disease) ([place])- (present on admission) Stable. Patient is on Servent, albuterol as an outpatient -Continue Brovana (substitute for Servent) -Duoneb prn   AAA (abdominal aortic aneurysm) without rupture- (present on admission) History of repair.   Essential hypertension- (present on admission) Patient with hypotension.  BP now stable   DM2 (diabetes mellitus, type 2) ([place]) Patient is on Janumet and glimepiride as an outpatient -  A1c 9.1% - Discharged with Lantus and sliding scale in  addition to home regimen   MRSA bacteremia Unknown source. Complicated by port-a-cath in place. ID consulted. Recent Transthoracic Echocardiogram from 1/12 -Has been continued on Vancomycin -TEE without vegetations   Diarrhea Possibly related to chemotherapy. C. difficile negative. -Supportive care   COVID-19 virus infection Incidental finding. Remdesivir started on 1/23. No steroids secondary to lack of hypoxia. CRP significantly elevated in setting of bacteremia. -Completed Remdesivir   HYPERLIPIDEMIA -given recent elevated LFT's, statin now on hold while in hospital    Possible Dilated Aortic Root -Incidentally on TEE, aortic root noted to be dilated to 39m with recs for f/u CTA -Reviewed recent CTA chest from 1/22 with radiology over phone. Per Radiology, aortic root on that study measured to be 3.5cm    Hyponatremia -Sodium acutely down to 130 -Pt asymptomatic -Reviewed serum and urine osm and urine Na. Serum osm borderline low at 279 with high urine Na of 85 suggesting SIADH -Sodium remained stable with further trending    The patient's chronic medical conditions were treated accordingly per the patient's home medication regimen except as noted.  On day of discharge, patient was felt deemed stable for discharge. Patient/family member advised to call PCP or come back to ER if needed.   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 Keep right armpit (axillary) drain in place until surgery follow up.   Increase activity slowly   Complete by: As directed       Allergies as of 06/08/2021       Reactions   Fexofenadine Other (See Comments)   "dries me out too much"   Hydrochlorothiazide Other (See Comments)   REACTION: cramps   Penicillins Hives   Pravastatin Sodium Other (See Comments)   REACTION: aches   Requip [ropinirole Hcl] Other (See Comments)   Bad dreams        Medication List     STOP taking these medications    famciclovir 250 MG tablet Commonly known as: FAMVIR   predniSONE 20 MG tablet Commonly known as: DELTASONE   Spiriva Respimat 2.5 MCG/ACT Aers Generic drug: Tiotropium Bromide Monohydrate       TAKE these medications    acetaminophen 325 MG tablet Commonly known as: TYLENOL Take 650 mg by mouth as needed for mild pain, moderate pain or headache.   albuterol 108 (90 Base) MCG/ACT inhaler Commonly known as: ProAir HFA Inhale 1-2 puffs into the lungs every 6 (six) hours as needed for wheezing or shortness of breath.   alendronate 70 MG tablet Commonly known as: FOSAMAX Take 70 mg by mouth every Sunday.   aspirin 81 MG EC tablet Take 1 tablet (81 mg total) by mouth daily at  6 (six) AM. Swallow whole.   atenolol 100 MG tablet Commonly known as: TENORMIN Take 100 mg by mouth daily.   blood glucose meter kit and supplies Kit Dispense based on patient and insurance preference. Use up to four times daily as directed.   calcium carbonate 1500 (600 Ca) MG Tabs tablet Commonly known as: OSCAL Take 600 mg of elemental calcium by mouth daily with breakfast.   diclofenac Sodium 1 % Gel Commonly known as: VOLTAREN Apply 2 g topically daily as needed (pain).   doxycycline 100 MG capsule Commonly known as:  VIBRAMYCIN Take 1 capsule (100 mg total) by mouth 2 (two) times daily for 7 days. Start taking AFTER you complete linezolid (Zyvox) Start taking on: June 22, 2021   DULoxetine 60 MG capsule Commonly known as: CYMBALTA Take 60 mg by mouth at bedtime.   Fish Oil Oil Take 1,500 mg by mouth daily.   glimepiride 2 MG tablet Commonly known as: AMARYL Take 1 tablet (2 mg total) by mouth daily before breakfast. What changed: when to take this   insulin glargine 100 UNIT/ML Solostar Pen Commonly known as: LANTUS Inject 18 Units into the skin daily.   insulin lispro 100 UNIT/ML KwikPen Commonly known as: HUMALOG Inject 2-15 Units into the skin 4 (four) times daily - after meals and at bedtime. Glucose 121 - 150: 2 units, Glucose 151 - 200: 3 units, Glucose 201 - 250: 5 units, Glucose 251 - 300: 8 units, Glucose 301 - 350: 11 units, Glucose 351 - 400: 15 units, Glucose > 400 call MD   Insulin Pen Needle 32G X 4 MM Misc 1 each by Does not apply route 4 (four) times daily - after meals and at bedtime.   Janumet XR 50-1000 MG Tb24 Generic drug: SitaGLIPtin-MetFORMIN HCl Take 2 tablets by mouth at bedtime.   lidocaine-prilocaine cream Commonly known as: EMLA Apply to affected area once   linezolid 600 MG tablet Commonly known as: ZYVOX Take 1 tablet (600 mg total) by mouth 2 (two) times daily for 14 days.   LORazepam 0.5 MG tablet Commonly known as: ATIVAN Take 1 tablet (0.5 mg total) by mouth every 8 (eight) hours. Take for nausea prn What changed:  when to take this reasons to take this additional instructions   omeprazole 40 MG capsule Commonly known as: PRILOSEC Take 40 mg by mouth daily.   ondansetron 8 MG tablet Commonly known as: Zofran Take 1 tablet (8 mg total) by mouth 2 (two) times daily as needed for refractory nausea / vomiting. Start on day 3 after cyclophosphamide chemotherapy.   potassium chloride SA 20 MEQ tablet Commonly known as: Klor-Con M20 TAKE 1  TABLET (20 MEQ TOTAL) BY MOUTH DAILY. What changed: how to take this   prochlorperazine 10 MG tablet Commonly known as: COMPAZINE Take 1 tablet (10 mg total) by mouth every 6 (six) hours as needed (Nausea or vomiting).   rosuvastatin 10 MG tablet Commonly known as: CRESTOR Take 10 mg by mouth daily.   Serevent Diskus 50 MCG/ACT diskus inhaler Generic drug: salmeterol 1 puff 2 (two) times daily.   tadalafil 5 MG tablet Commonly known as: CIALIS Take 5 mg by mouth daily as needed for erectile dysfunction.   tamsulosin 0.4 MG Caps capsule Commonly known as: FLOMAX Take 0.4 mg by mouth every evening.   traMADol 50 MG tablet Commonly known as: ULTRAM Take 1 tablet (50 mg total) by mouth every 6 (six) hours as needed for up to 5  days (mild pain).   Zinc 50 MG Tabs Take 50 mg by mouth daily.               Discharge Care Instructions  (From admission, onward)           Start     Ordered   06/08/21 0000  Discharge wound care:       Comments: Keep foley in place. Keep right armpit (axillary) drain in place until surgery follow up.   06/08/21 1209            Follow-up Information     Dwan Bolt, MD. Go on 06/13/2021.   Specialty: General Surgery Why: Your appointment is 2/6 at 9:30am Please arrive 15 minutes early to check in. Contact information: [place]. [address] [place] [address] [phone]                Allergies  Allergen Reactions   Fexofenadine Other (See Comments)    "dries me out too much"   Hydrochlorothiazide Other (See Comments)    REACTION: cramps   Penicillins Hives   Pravastatin Sodium Other (See Comments)    REACTION: aches   Requip [Ropinirole Hcl] Other (See Comments)    Bad dreams    Consultations:   Discharge Exam: BP 99/77 (BP Location: Right Arm)    Pulse 64    Temp 98.3 F (36.8 C) (Oral)    Resp 20    Ht 6' (1.829 m)    Wt 80.5 kg    SpO2 (!) 87%    BMI 24.07 kg/m  Physical  Exam Constitutional:      General: He is not in acute distress.    Appearance: Normal appearance.  HENT:     Head: Normocephalic and atraumatic.     Mouth/Throat:     Mouth: Mucous membranes are moist.  Eyes:     Extraocular Movements: Extraocular movements intact.  Cardiovascular:     Rate and Rhythm: Normal rate and regular rhythm.     Heart sounds: Normal heart sounds.  Pulmonary:     Effort: Pulmonary effort is normal. No respiratory distress.     Breath sounds: Normal breath sounds. No wheezing.  Abdominal:     General: Bowel sounds are normal. There is no distension.     Palpations: Abdomen is soft.     Tenderness: There is no abdominal tenderness.  Musculoskeletal:        General: Normal range of motion.     Cervical back: Normal range of motion and neck supple.  Skin:    General: Skin is warm and dry.  Neurological:     General: No focal deficit present.     Mental Status: He is alert.  Psychiatric:        Mood and Affect: Mood normal.        Behavior: Behavior normal.     The results of significant diagnostics from this hospitalization (including imaging, microbiology, ancillary and laboratory) are listed below for reference.   Microbiology: Recent Results (from the past 240 hour(s))  Culture, blood (routine x 2)     Status: None   Collection Time: 05/31/21 11:07 PM   Specimen: BLOOD RIGHT ARM  Result Value Ref Range Status   Specimen Description   Final    BLOOD RIGHT ARM Performed at [facility], [place] [address] [place], [place] [address]    Special Requests   Final    BOTTLES DRAWN AEROBIC ONLY Blood  Culture adequate volume Performed at [facility] [address] [place], [place] [address]    Culture   Final    NO GROWTH 5 DAYS Performed at [facility], [place] [address] [place], [place] [address]    Report Status 06/06/2021 FINAL  Final  Culture, blood (routine x 2)     Status: None   Collection Time:  05/31/21 11:07 PM   Specimen: BLOOD RIGHT WRIST  Result Value Ref Range Status   Specimen Description   Final    BLOOD RIGHT WRIST Performed at [facility] [address], [place], [place] [address]    Special Requests   Final    BOTTLES DRAWN AEROBIC ONLY Blood Culture adequate volume Performed at [facility] [address], [place], [place] [address]    Culture   Final    NO GROWTH 5 DAYS Performed at [facility], [place] [address], [place], [place] [address]    Report Status 06/06/2021 FINAL  Final  Aerobic Culture w Gram Stain (superficial specimen)     Status: None   Collection Time: 06/01/21  3:20 PM   Specimen: Chest; Abscess  Result Value Ref Range Status   Specimen Description   Final    ABSCESS CHEST RIGHT PORTA CATH Performed at [facility] [address] [place], [place] [address]    Special Requests   Final    NONE Performed at [facility], [place] [address] [place], [place] [address]    Gram Stain   Final    FEW WBC PRESENT, PREDOMINANTLY MONONUCLEAR NO ORGANISMS SEEN Performed at [facility], [place] [address], [place], [place] [address]    Culture   Final    RARE METHICILLIN RESISTANT STAPHYLOCOCCUS AUREUS RARE STAPHYLOCOCCUS HOMINIS    Report Status 06/04/2021 FINAL  Final   Organism ID, Bacteria METHICILLIN RESISTANT STAPHYLOCOCCUS AUREUS  Final   Organism ID, Bacteria STAPHYLOCOCCUS HOMINIS  Final      Susceptibility   Methicillin resistant staphylococcus aureus - MIC*    CIPROFLOXACIN >=8 RESISTANT Resistant     ERYTHROMYCIN >=8 RESISTANT Resistant     GENTAMICIN <=0.5 SENSITIVE Sensitive     OXACILLIN >=4 RESISTANT Resistant     TETRACYCLINE <=1 SENSITIVE Sensitive     VANCOMYCIN <=0.5 SENSITIVE Sensitive     TRIMETH/SULFA <=10 SENSITIVE Sensitive     CLINDAMYCIN <=0.25 SENSITIVE Sensitive     RIFAMPIN <=0.5 SENSITIVE Sensitive     Inducible Clindamycin  NEGATIVE Sensitive     * RARE METHICILLIN RESISTANT STAPHYLOCOCCUS AUREUS   Staphylococcus hominis - MIC*    CIPROFLOXACIN <=0.5 SENSITIVE Sensitive     ERYTHROMYCIN <=0.25 SENSITIVE Sensitive     GENTAMICIN <=0.5 SENSITIVE Sensitive     OXACILLIN RESISTANT Resistant     TETRACYCLINE <=1 SENSITIVE Sensitive     VANCOMYCIN 1 SENSITIVE Sensitive     TRIMETH/SULFA <=10 SENSITIVE Sensitive     CLINDAMYCIN <=0.25 SENSITIVE Sensitive     RIFAMPIN <=0.5 SENSITIVE Sensitive     Inducible Clindamycin NEGATIVE Sensitive     * RARE STAPHYLOCOCCUS HOMINIS  Aerobic Culture w Gram Stain (superficial specimen)     Status: None   Collection Time: 06/01/21  3:25 PM   Specimen: Axilla, Right; Abscess  Result Value Ref Range Status   Specimen Description   Final    ABSCESS RIGHT AXILLA Performed at [facility] [address], [place], [place] [address]  Special Requests   Final    NONE Performed at [facility], [place] [address], [place], [place] [address]    Gram Stain   Final    MODERATE WBC PRESENT,BOTH PMN AND MONONUCLEAR ABUNDANT GRAM POSITIVE COCCI Performed at [facility], [place] [address] [place], [place] [address]    Culture   Final    ABUNDANT METHICILLIN RESISTANT STAPHYLOCOCCUS AUREUS   Report Status 06/04/2021 FINAL  Final   Organism ID, Bacteria METHICILLIN RESISTANT STAPHYLOCOCCUS AUREUS  Final      Susceptibility   Methicillin resistant staphylococcus aureus - MIC*    CIPROFLOXACIN >=8 RESISTANT Resistant     ERYTHROMYCIN >=8 RESISTANT Resistant     GENTAMICIN <=0.5 SENSITIVE Sensitive     OXACILLIN >=4 RESISTANT Resistant     TETRACYCLINE <=1 SENSITIVE Sensitive     VANCOMYCIN 1 SENSITIVE Sensitive     TRIMETH/SULFA <=10 SENSITIVE Sensitive     CLINDAMYCIN <=0.25 SENSITIVE Sensitive     RIFAMPIN <=0.5 SENSITIVE Sensitive     Inducible Clindamycin NEGATIVE Sensitive     * ABUNDANT METHICILLIN RESISTANT STAPHYLOCOCCUS AUREUS      Labs: BNP (last 3 results) No results for input(s): BNP in the last 8760 hours. Basic Metabolic Panel: Recent Labs  Lab 06/03/21 0408 06/04/21 0403 06/06/21 0753 06/07/21 0812 06/08/21 0402  NA 135 136 130* 130* 130*  K 3.7 3.3* 4.2 5.0 4.0  CL 107 105 96* 94* 96*  CO2 '22 25 25 29 26  ' GLUCOSE 262* 243* 312* 293* 270*  BUN '15 11 12 13 17  ' CREATININE 0.77 0.80 0.68 0.83 0.88  CALCIUM 7.0* 7.5* 8.3* 8.7* 8.6*   Liver Function Tests: Recent Labs  Lab 06/03/21 0408 06/04/21 0403 06/06/21 0753 06/07/21 0812 06/08/21 0402  AST 50* 27 31 41 35  ALT 103* 76* 52* 54* 52*  ALKPHOS 72 84 86 85 86  BILITOT 0.6 0.7 0.8 0.9 0.8  PROT 5.4* 5.7* 6.0* 6.6 6.5  ALBUMIN 2.3* 2.6* 2.8* 2.8* 2.9*   No results for input(s): LIPASE, AMYLASE in the last 168 hours. No results for input(s): AMMONIA in the last 168 hours. CBC: Recent Labs  Lab 06/03/21 0408 06/04/21 0403 06/06/21 0358 06/08/21 0402  WBC 6.0 5.9 7.1 7.3  NEUTROABS 4.4 5.1  --  4.9  HGB 10.0* 10.7* 11.0* 11.9*  HCT 30.2* 32.1* 31.1* 35.5*  MCV 84.8 84.5 81.2 84.1  PLT 127* 164 205 282   Cardiac Enzymes: No results for input(s): CKTOTAL, CKMB, CKMBINDEX, TROPONINI in the last 168 hours. BNP: Invalid input(s): POCBNP CBG: Recent Labs  Lab 06/07/21 1633 06/07/21 2100 06/07/21 2151 06/08/21 0740 06/08/21 1330  GLUCAP 323* 283* 329* 283* 254*   D-Dimer No results for input(s): DDIMER in the last 72 hours. Hgb A1c No results for input(s): HGBA1C in the last 72 hours. Lipid Profile No results for input(s): CHOL, HDL, LDLCALC, TRIG, CHOLHDL, LDLDIRECT in the last 72 hours. Thyroid function studies No results for input(s): TSH, T4TOTAL, T3FREE, THYROIDAB in the last 72 hours.  Invalid input(s): FREET3 Anemia work up No results for input(s): VITAMINB12, FOLATE, FERRITIN, TIBC, IRON, RETICCTPCT in the last 72 hours. Urinalysis    Component Value Date/Time   COLORURINE YELLOW 05/29/2021 1538   APPEARANCEUR  CLEAR 05/29/2021 1538   LABSPEC 1.018 05/29/2021 1538   PHURINE 5.0 05/29/2021 1538   GLUCOSEU >=500 (A) 05/29/2021 1538   GLUCOSEU NEGATIVE 11/23/2011 0756   HGBUR SMALL (A) 05/29/2021 1538   BILIRUBINUR NEGATIVE  05/29/2021 1538   KETONESUR 5 (A) 05/29/2021 1538   PROTEINUR NEGATIVE 05/29/2021 1538   UROBILINOGEN 0.2 11/23/2011 0756   NITRITE NEGATIVE 05/29/2021 1538   LEUKOCYTESUR NEGATIVE 05/29/2021 1538   Sepsis Labs Invalid input(s): PROCALCITONIN,  WBC,  LACTICIDVEN Microbiology Recent Results (from the past 240 hour(s))  Culture, blood (routine x 2)     Status: None   Collection Time: 05/31/21 11:07 PM   Specimen: BLOOD RIGHT ARM  Result Value Ref Range Status   Specimen Description   Final    BLOOD RIGHT ARM Performed at [facility], [place] [address], [place], [place] [address]    Special Requests   Final    BOTTLES DRAWN AEROBIC ONLY Blood Culture adequate volume Performed at [facility] [address], [place], [place] [address]    Culture   Final    NO GROWTH 5 DAYS Performed at [facility], [place] [address] [place], [place] [address]    Report Status 06/06/2021 FINAL  Final  Culture, blood (routine x 2)     Status: None   Collection Time: 05/31/21 11:07 PM   Specimen: BLOOD RIGHT WRIST  Result Value Ref Range Status   Specimen Description   Final    BLOOD RIGHT WRIST Performed at [facility] [address] [place], [place] [address]    Special Requests   Final    BOTTLES DRAWN AEROBIC ONLY Blood Culture adequate volume Performed at [facility] [address] [place], [place] [address]    Culture   Final    NO GROWTH 5 DAYS Performed at [facility], [place] [address], [place], [place] [address]    Report Status 06/06/2021 FINAL  Final  Aerobic Culture w Gram Stain (superficial specimen)     Status: None   Collection Time: 06/01/21  3:20 PM   Specimen: Chest;  Abscess  Result Value Ref Range Status   Specimen Description   Final    ABSCESS CHEST RIGHT PORTA CATH Performed at [facility] [address] [place], [place] [address]    Special Requests   Final    NONE Performed at [facility], [place] [address] [place], [place] [address]    Gram Stain   Final    FEW WBC PRESENT, PREDOMINANTLY MONONUCLEAR NO ORGANISMS SEEN Performed at [facility], [place] [address], [place], [place] [address]    Culture   Final    RARE METHICILLIN RESISTANT STAPHYLOCOCCUS AUREUS RARE STAPHYLOCOCCUS HOMINIS    Report Status 06/04/2021 FINAL  Final   Organism ID, Bacteria METHICILLIN RESISTANT STAPHYLOCOCCUS AUREUS  Final   Organism ID, Bacteria STAPHYLOCOCCUS HOMINIS  Final      Susceptibility   Methicillin resistant staphylococcus aureus - MIC*    CIPROFLOXACIN >=8 RESISTANT Resistant     ERYTHROMYCIN >=8 RESISTANT Resistant     GENTAMICIN <=0.5 SENSITIVE Sensitive     OXACILLIN >=4 RESISTANT Resistant     TETRACYCLINE <=1 SENSITIVE Sensitive     VANCOMYCIN <=0.5 SENSITIVE Sensitive     TRIMETH/SULFA <=10 SENSITIVE Sensitive     CLINDAMYCIN <=0.25 SENSITIVE Sensitive     RIFAMPIN <=0.5 SENSITIVE Sensitive     Inducible Clindamycin NEGATIVE Sensitive     * RARE METHICILLIN RESISTANT STAPHYLOCOCCUS AUREUS   Staphylococcus hominis - MIC*    CIPROFLOXACIN <=0.5 SENSITIVE Sensitive     ERYTHROMYCIN <=0.25 SENSITIVE Sensitive     GENTAMICIN <=0.5 SENSITIVE Sensitive  OXACILLIN RESISTANT Resistant     TETRACYCLINE <=1 SENSITIVE Sensitive     VANCOMYCIN 1 SENSITIVE Sensitive     TRIMETH/SULFA <=10 SENSITIVE Sensitive     CLINDAMYCIN <=0.25 SENSITIVE Sensitive     RIFAMPIN <=0.5 SENSITIVE Sensitive     Inducible Clindamycin NEGATIVE Sensitive     * RARE STAPHYLOCOCCUS HOMINIS  Aerobic Culture w Gram Stain (superficial specimen)     Status: None   Collection Time: 06/01/21  3:25 PM   Specimen: Axilla, Right;  Abscess  Result Value Ref Range Status   Specimen Description   Final    ABSCESS RIGHT AXILLA Performed at [facility] [address] [place], [place] [address]    Special Requests   Final    NONE Performed at [facility], [place] [address], [place], [place] [address]    Gram Stain   Final    MODERATE WBC PRESENT,BOTH PMN AND MONONUCLEAR ABUNDANT GRAM POSITIVE COCCI Performed at [facility], [place] [address], [place],  [address]    Culture   Final    ABUNDANT METHICILLIN RESISTANT STAPHYLOCOCCUS AUREUS   Report Status 06/04/2021 FINAL  Final   Organism ID, Bacteria METHICILLIN RESISTANT STAPHYLOCOCCUS AUREUS  Final      Susceptibility   Methicillin resistant staphylococcus aureus - MIC*    CIPROFLOXACIN >=8 RESISTANT Resistant     ERYTHROMYCIN >=8 RESISTANT Resistant     GENTAMICIN <=0.5 SENSITIVE Sensitive     OXACILLIN >=4 RESISTANT Resistant     TETRACYCLINE <=1 SENSITIVE Sensitive     VANCOMYCIN 1 SENSITIVE Sensitive     TRIMETH/SULFA <=10 SENSITIVE Sensitive     CLINDAMYCIN <=0.25 SENSITIVE Sensitive     RIFAMPIN <=0.5 SENSITIVE Sensitive     Inducible Clindamycin NEGATIVE Sensitive     * ABUNDANT METHICILLIN RESISTANT STAPHYLOCOCCUS AUREUS    Procedures/Studies: DG Chest 2 View  Result Date: 05/29/2021 CLINICAL DATA:  Suspected sepsis EXAM: CHEST - 2 VIEW COMPARISON:  05/13/2021 FINDINGS: Right-sided central venous port tip over the SVC. Partial clearing of previously noted patchy lower lung airspace opacities. Normal cardiomediastinal silhouette. No pneumothorax. IMPRESSION: Partial but incomplete clearing of previously noted patchy bilateral lower lung opacities suspicious for pneumonia. No new airspace disease is seen. Electronically Signed   By: Donavan Foil M.D.   On: 05/29/2021 16:15   CT Head Wo Contrast  Result Date: 05/29/2021 CLINICAL DATA:  Near syncope. Fall. Mental status change, unknown cause EXAM: CT  HEAD WITHOUT CONTRAST TECHNIQUE: Contiguous axial images were obtained from the base of the skull through the vertex without intravenous contrast. RADIATION DOSE REDUCTION: This exam was performed according to the departmental dose-optimization program which includes automated exposure control, adjustment of the mA and/or kV according to patient size and/or use of iterative reconstruction technique. COMPARISON:  03/28/2012 FINDINGS: Brain: No acute intracranial abnormality. Specifically, no hemorrhage, hydrocephalus, mass lesion, acute infarction, or significant intracranial injury. Vascular: No hyperdense vessel or unexpected calcification. Skull: No acute calvarial abnormality. Sinuses/Orbits: No acute findings Other: None IMPRESSION: No acute intracranial abnormality. Electronically Signed   By: Rolm Baptise M.D.   On: 05/29/2021 18:38   CT Angio Chest PE W/Cm &/Or Wo Cm  Result Date: 05/29/2021 CLINICAL DATA:  Near syncope.  Fall.  PE suspected. EXAM: CT ANGIOGRAPHY CHEST WITH CONTRAST TECHNIQUE: Multidetector CT imaging of the chest was performed using the standard protocol during bolus administration of intravenous contrast. Multiplanar CT image reconstructions and MIPs were obtained to evaluate the vascular anatomy.  RADIATION DOSE REDUCTION: This exam was performed according to the departmental dose-optimization program which includes automated exposure control, adjustment of the mA and/or kV according to patient size and/or use of iterative reconstruction technique. CONTRAST:  78m OMNIPAQUE IOHEXOL 350 MG/ML SOLN COMPARISON:  Standard CT chest 10/14/2019 FINDINGS: Cardiovascular: The heart size is normal. No substantial pericardial effusion. Coronary artery calcification is evident. Mild atherosclerotic calcification is noted in the wall of the thoracic aorta. There is no filling defect within the opacified pulmonary arteries to suggest the presence of an acute pulmonary embolus. Right Port-A-Cath tip  is positioned in the distal SVC. Mediastinum/Nodes: No mediastinal lymphadenopathy. There is no hilar lymphadenopathy. The esophagus has normal imaging features. There is no axillary lymphadenopathy. Lungs/Pleura: Interval development of peripheral architectural distortion and scarring with areas of peripheral ground-glass opacity since prior chest CT and also since PET-CT of 04/15/2021. No focal airspace consolidation. No pleural effusion. No pneumothorax. Paraseptal emphysema noted in the lung apices. Upper Abdomen: The liver shows diffusely decreased attenuation suggesting fat deposition. Tiny fat density lesions in both adrenal glands are consistent with tiny adenomas or myelolipomas, stable. Musculoskeletal: No worrisome lytic or sclerotic osseous abnormality. Old fracture nonunion posterior right tenth rib with healed posterior ninth rib fracture. Evidence of previous vertebral augmentation at L1, incompletely visualized. Review of the MIP images confirms the above findings. IMPRESSION: 1. No CT evidence for acute pulmonary embolus. 2. Since prior PET-CT of 04/15/2021, the patient has developed peripheral patchy and nodular areas of architectural distortion and ground-glass opacity. Imaging features are suggestive of sequelae of prior atypical infection, including viral etiology. Acute infectious/inflammatory process is not excluded but considered less likely. 3. The liver shows diffusely decreased attenuation suggesting fat deposition. 4.  Emphysema (ICD10-J43.9) and Aortic Atherosclerosis (ICD10-170.0) Electronically Signed   By: EMisty StanleyM.D.   On: 05/29/2021 18:47   CT ABDOMEN PELVIS W CONTRAST  Result Date: 05/30/2021 CLINICAL DATA:  Abdominal pain, lymphoma. EXAM: CT ABDOMEN AND PELVIS WITH CONTRAST TECHNIQUE: Multidetector CT imaging of the abdomen and pelvis was performed using the standard protocol following bolus administration of intravenous contrast. RADIATION DOSE REDUCTION: This exam was  performed according to the departmental dose-optimization program which includes automated exposure control, adjustment of the mA and/or kV according to patient size and/or use of iterative reconstruction technique. CONTRAST:  1051mOMNIPAQUE IOHEXOL 300 MG/ML  SOLN COMPARISON:  CT chest 05/29/2021 PET 04/15/2021 and CT angiography abdomen and pelvis 02/25/2021. FINDINGS: Lower chest: Peripheral predominant patchy areas of ground-glass, new from 04/15/2021. Heart size is normal. No pericardial or pleural effusion. Atherosclerotic calcification of the aorta, aortic valve and coronary arteries. Hepatobiliary: Liver is decreased in attenuation diffusely and is enlarged, measuring 20.0 cm. Subcentimeter low-attenuation lesion in the dome of the right hepatic lobe is too small to characterize. Liver and gallbladder are otherwise unremarkable. No biliary ductal dilatation. Pancreas: There are a few scattered calcifications in the pancreas. Spleen: Negative. Adrenals/Urinary Tract: Adrenal glands are unremarkable. 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 Right kidney is unremarkable. Soft tissue fullness is again seen in the left intrarenal collecting system and left renal pelvis. Ureters are decompressed. Bladder is grossly unremarkable. Stomach/Bowel: Stomach, small bowel, appendix and colon are unremarkable. Vascular/Lymphatic: Atherosclerotic calcification of the aorta with an endovascular stent graft in place. Infiltrative appearing soft tissue in the left common iliac station measures approximately 2.0 cm (2/69), similar. No additional pathologically enlarged lymph nodes. Reproductive: Prostate is visualized. Other: Small left inguinal hernia contains  fat. Mesenteries and peritoneum are unremarkable. Musculoskeletal: Degenerative changes in the spine. Old bilateral rib fractures. T12 vertebral body augmentation. No worrisome lytic or sclerotic lesions. IMPRESSION: 1. No acute  findings to explain the patient's abdominal pain. 2. Left intrarenal collecting system soft tissue mass and left common iliac nodal mass, consistent with the provided history of lymphoma. Appearance is similar to 02/25/2021. 3. Bibasilar peripheral predominant ground-glass opacities in the lungs may be postinfectious/postinflammatory in etiology, including due to COVID-19. 4. Enlarged steatotic liver. 5. Chronic calcific pancreatitis. 6. Aortic atherosclerosis (ICD10-I70.0). Coronary artery calcification. Electronically Signed   By: Lorin Picket M.D.   On: 05/30/2021 13:52   CT BIOPSY  Result Date: 06/08/2021 INDICATION: Remote history of B-cell lymphoma, pancytopenia EXAM: CT GUIDED RIGHT ILIAC BONE MARROW ASPIRATION AND CORE BIOPSY Date:  06/08/2021 06/08/2021 1:04 pm Radiologist:  Jerilynn Mages. Daryll Brod, MD Guidance:  CT FLUOROSCOPY TIME:  Fluoroscopy Time: None. MEDICATIONS: 1% lidocaine local ANESTHESIA/SEDATION: 2.0 mg IV Versed; 100 mcg IV Fentanyl Moderate Sedation Time:  10 minute The patient was continuously monitored during the procedure by the interventional radiology nurse under my direct supervision. CONTRAST:  None. COMPLICATIONS: None PROCEDURE: Informed consent was obtained from the patient following explanation of the procedure, risks, benefits and alternatives. The patient understands, agrees and consents for the procedure. All questions were addressed. A time out was performed. The patient was positioned prone and non-contrast localization CT was performed of the pelvis to demonstrate the iliac marrow spaces. Maximal barrier sterile technique utilized including caps, mask, sterile gowns, sterile gloves, large sterile drape, hand hygiene, and Betadine prep. Under sterile conditions and local anesthesia, an 11 gauge coaxial bone biopsy needle was advanced into the right iliac marrow space. Needle position was confirmed with CT imaging. Initially, bone marrow aspiration was performed. Next, the 11 gauge  outer cannula was utilized to obtain a right iliac bone marrow core biopsy. Needle was removed. Hemostasis was obtained with compression. The patient tolerated the procedure well. Samples were prepared with the cytotechnologist. No immediate complications. IMPRESSION: CT guided right iliac bone marrow aspiration and core biopsy. Electronically Signed   By: Jerilynn Mages.  Shick M.D.   On: 06/08/2021 13:21   DG BONE DENSITY (DXA)  Result Date: 05/11/2021 EXAM: DUAL X-RAY ABSORPTIOMETRY (DXA) FOR BONE MINERAL DENSITY IMPRESSION: Referring Physician:  [name] Your patient completed a bone mineral density test using GE Lunar iDXA system (analysis version: 16). Technologist: Davy PATIENT: Name: Aaron Weaver, Aaron Weaver Patient ID: [id number] Birth Date: May 11, 1955 Height: 71.0 in. Sex: Male Measured: 05/11/2021 Weight: 198.0 lbs. Indications: Caucasian, Diabetic non insulin Fractures: Right Wrist, Vertebrae Treatments: Calcium (E943.0), Fosamax, Vitamin D (E933.5) ASSESSMENT: The BMD measured at Femur Neck Left is 0.920 g/cm2 with a T-score of -0.8. This patient is considered normal according to [facility]) criteria. The quality of the exam is good. The lumbar spine was excluded due to degenerative and surgical changes. Site Region Measured Date Measured Age [age]A BMD Significant CHANGE T-score DualFemur Neck Left 05/11/2021 67.1 -0.6 0.920 g/cm2 DualFemur Neck Left 11/06/2018 64.6 -1.0 0.897 g/cm2 DualFemur Total Mean 05/11/2021 67.1 0.5 1.069 g/cm2 * DualFemur Total Mean 11/06/2018 64.6 0.1 1.017 g/cm2 Left Forearm Radius 33% 05/11/2021 67.1 -0.3 0.959 g/cm2 Left Forearm Radius 33% 11/06/2018 64.6 -0.6 0.947 g/cm2 World Health Organization [facility]) criteria for post-menopausal, Caucasian Women: Normal       T-score at or above -1 SD Osteopenia   T-score between -1 and -2.5 SD Osteoporosis T-score at or below -2.5 SD  RECOMMENDATION: National Osteoporosis Foundation recommends that FDA-approved medical therapies be  considered in postmenopausal women and men age 73 or older with a: 1. Hip or vertebral (clinical or morphometric) fracture. 2. T-score of less than or equal to -2.5 at the spine or hip. 3. Ten-year fracture probability by FRAX of 3% or greater for hip fracture or 20% or greater for major osteoporotic fracture. All treatment decisions require clinical judgment and consideration of individual patient factors, including patient preferences, co-morbidities, previous drug use, risk factors not captured in the FRAX model (e.g. falls, vitamin D deficiency, increased bone turnover, interval significant decline in bone density) and possible under- or over-estimation of fracture risk by FRAX. All patients should ensure an adequate intake of dietary calcium (1200 mg/d) and vitamin D (800 IU daily) unless contraindicated. FOLLOW-UP: People with diagnosed cases of osteoporosis or osteopenia should be regularly tested for bone mineral density. For patients eligible for Medicare, routine testing is allowed once every 2 years. The testing frequency can be increased to one year for patients who have rapidly progressing disease, or for those who are receiving medical therapy to restore bone mass. I have reviewed this study and agree with the findings. [facility] Electronically Signed   By: Elmer Picker M.D.   On: 05/11/2021 08:42   DG CHEST PORT 1 VIEW  Result Date: 05/13/2021 CLINICAL DATA:  Placement of right chest port EXAM: PORTABLE CHEST 1 VIEW COMPARISON:  07/12/2020 FINDINGS: Cardiac size is within normal limits. There is interval appearance of patchy infiltrates in the parahilar regions and lower lung fields suggesting possible multifocal pneumonia. Possibility of underlying scarring is not excluded. There is no significant pleural effusion or pneumothorax. There is interval placement of right subclavian chest port with its tip in the superior vena cava. There is no significant pleural effusion or  pneumothorax. Surgical clips are seen in the right chest wall and right axilla. IMPRESSION: Interval appearance of extensive patchy infiltrates in both lungs, more so in the lower lung fields suggesting possible multifocal pneumonia. Electronically Signed   By: Elmer Picker M.D.   On: 05/13/2021 09:03   CT BONE MARROW BIOPSY & ASPIRATION  Result Date: 06/08/2021 INDICATION: Remote history of B-cell lymphoma, pancytopenia EXAM: CT GUIDED RIGHT ILIAC BONE MARROW ASPIRATION AND CORE BIOPSY Date:  06/08/2021 06/08/2021 1:04 pm Radiologist:  Jerilynn Mages. Daryll Brod, MD Guidance:  CT FLUOROSCOPY TIME:  Fluoroscopy Time: None. MEDICATIONS: 1% lidocaine local ANESTHESIA/SEDATION: 2.0 mg IV Versed; 100 mcg IV Fentanyl Moderate Sedation Time:  10 minute The patient was continuously monitored during the procedure by the interventional radiology nurse under my direct supervision. CONTRAST:  None. COMPLICATIONS: None PROCEDURE: Informed consent was obtained from the patient following explanation of the procedure, risks, benefits and alternatives. The patient understands, agrees and consents for the procedure. All questions were addressed. A time out was performed. The patient was positioned prone and non-contrast localization CT was performed of the pelvis to demonstrate the iliac marrow spaces. Maximal barrier sterile technique utilized including caps, mask, sterile gowns, sterile gloves, large sterile drape, hand hygiene, and Betadine prep. Under sterile conditions and local anesthesia, an 11 gauge coaxial bone biopsy needle was advanced into the right iliac marrow space. Needle position was confirmed with CT imaging. Initially, bone marrow aspiration was performed. Next, the 11 gauge outer cannula was utilized to obtain a right iliac bone marrow core biopsy. Needle was removed. Hemostasis was obtained with compression. The patient tolerated the procedure well. Samples were prepared with the  cytotechnologist. No immediate  complications. IMPRESSION: CT guided right iliac bone marrow aspiration and core biopsy. Electronically Signed   By: Jerilynn Mages.  Shick M.D.   On: 06/08/2021 13:21   DG C-Arm 1-60 Min  Result Date: 05/13/2021 CLINICAL DATA:  Fluoroscopic assistance for placement of chest port EXAM: DG C-ARM 1-60 MIN FLUOROSCOPY TIME:  Fluoroscopy Time:  12 seconds Number of Acquired Spot Images: 1 COMPARISON:  None. FINDINGS: Fluoroscopic assistance was provided for placement of right chest port IMPRESSION: Fluoroscopic assistance was provided for placement of right chest port. Electronically Signed   By: Elmer Picker M.D.   On: 05/13/2021 08:43   ECHOCARDIOGRAM COMPLETE  Result Date: 05/19/2021    ECHOCARDIOGRAM REPORT   Patient Name:   Aaron BAROT Sr. Date of Exam: 05/19/2021 Medical Rec #:  [id number]          Height:       72.0 in Accession #:    [id number]         Weight:       198.0 lb Date of Birth:  1956/02/12         BSA:          2.121 m Patient Age:    60 years           BP:           153/87 mmHg Patient Gender: M                  HR:           63 bpm. Exam Location:  Outpatient Procedure: 3D Echo, 2D Echo, Cardiac Doppler, Color Doppler and Strain Analysis Indications:    Z51.11 Encounter for antineoplastic chemotheraphy  History:        Patient has prior history of Echocardiogram examinations, most                 recent 06/20/2018. COPD, Signs/Symptoms:Chest Pain; Risk                 Factors:Hypertension, Diabetes, Dyslipidemia and Current Smoker.                 Lymphoma.  Sonographer:    Roseanna Rainbow RDCS Referring Phys: [place] Comments: Technically difficult study due to poor echo windows. Image acquisition challenging due to COPD and Image acquisition challenging due to respiratory motion. IMPRESSIONS  1. Left ventricular ejection fraction by 3D volume is 59 %. The left ventricle has normal function. The left ventricle has no regional wall motion abnormalities. Left ventricular  diastolic parameters are consistent with Grade I diastolic dysfunction (impaired relaxation). The average left ventricular global longitudinal strain is -22.2 %. The global longitudinal strain is normal.  2. Right ventricular systolic function is normal. The right ventricular size is normal.  3. The mitral valve is normal in structure. No evidence of mitral valve regurgitation. No evidence of mitral stenosis.  4. The aortic valve is normal in structure. Aortic valve regurgitation is trivial. No aortic stenosis is present.  5. The inferior vena cava is normal in size with greater than 50% respiratory variability, suggesting right atrial pressure of 3 mmHg. Comparison(s): No significant change from prior study. Prior images reviewed side by side. FINDINGS  Left Ventricle: Left ventricular ejection fraction by 3D volume is 59 %. The left ventricle has normal function. The left ventricle has no regional wall motion abnormalities. The average left ventricular global longitudinal strain is -22.2 %. The global  longitudinal strain is normal. The left ventricular internal cavity size was normal in size. There is no left ventricular hypertrophy. Left ventricular diastolic parameters are consistent with Grade I diastolic dysfunction (impaired relaxation). Right Ventricle: The right ventricular size is normal. No increase in right ventricular wall thickness. Right ventricular systolic function is normal. Left Atrium: Left atrial size was normal in size. Right Atrium: Right atrial size was normal in size. Pericardium: There is no evidence of pericardial effusion. Mitral Valve: The mitral valve is normal in structure. No evidence of mitral valve regurgitation. No evidence of mitral valve stenosis. Tricuspid Valve: The tricuspid valve is normal in structure. Tricuspid valve regurgitation is not demonstrated. No evidence of tricuspid stenosis. Aortic Valve: The aortic valve is normal in structure. Aortic valve regurgitation is  trivial. No aortic stenosis is present. Aortic valve mean gradient measures 3.0 mmHg. Aortic valve peak gradient measures 5.5 mmHg. Aortic valve area, by VTI measures 3.22 cm. Pulmonic Valve: The pulmonic valve was normal in structure. Pulmonic valve regurgitation is not visualized. No evidence of pulmonic stenosis. Aorta: The aortic root is normal in size and structure. Venous: The inferior vena cava is normal in size with greater than 50% respiratory variability, suggesting right atrial pressure of 3 mmHg. IAS/Shunts: No atrial level shunt detected by color flow Doppler.  LEFT VENTRICLE PLAX 2D LVIDd:         4.30 cm         Diastology LVIDs:         2.90 cm         LV e' medial:    4.24 cm/s LV PW:         1.05 cm         LV E/e' medial:  15.3 LV IVS:        1.10 cm         LV e' lateral:   7.18 cm/s LVOT diam:     2.40 cm         LV E/e' lateral: 9.1 LV SV:         76 LV SV Index:   36              2D LVOT Area:     4.52 cm        Longitudinal                                Strain                                2D Strain GLS  -22.2 % LV Volumes (MOD)               Avg: LV vol d, MOD    76.2 ml A2C:                           3D Volume EF LV vol d, MOD    94.4 ml       LV 3D EF:    Left A4C:                                        ventricul LV vol s, MOD    32.2 ml  ar A2C:                                        ejection LV vol s, MOD    35.2 ml                    fraction A4C:                                        by 3D LV SV MOD A2C:   44.0 ml                    volume is LV SV MOD A4C:   94.4 ml                    59 %. LV SV MOD BP:    52.3 ml                                 3D Volume EF:                                3D EF:        59 %                                LV EDV:       143 ml                                LV ESV:       59 ml                                LV SV:        84 ml RIGHT VENTRICLE             IVC RV S prime:     11.60 cm/s  IVC diam: 1.80 cm TAPSE (M-mode): 1.5 cm  LEFT ATRIUM             Index        RIGHT ATRIUM           Index LA diam:        2.90 cm 1.37 cm/m   RA Area:     10.10 cm LA Vol (A2C):   36.7 ml 17.30 ml/m  RA Volume:   19.80 ml  9.33 ml/m LA Vol (A4C):   26.8 ml 12.63 ml/m LA Biplane Vol: 32.5 ml 15.32 ml/m  AORTIC VALVE AV Area (Vmax):    3.41 cm AV Area (Vmean):   3.52 cm AV Area (VTI):     3.22 cm AV Vmax:           117.00 cm/s AV Vmean:          71.700 cm/s AV VTI:            0.236 m AV Peak Grad:      5.5 mmHg AV Mean Grad:      3.0 mmHg LVOT Vmax:  88.30 cm/s LVOT Vmean:        55.800 cm/s LVOT VTI:          0.168 m LVOT/AV VTI ratio: 0.71  AORTA Ao Root diam: 4.00 cm Ao Asc diam:  3.80 cm MITRAL VALVE MV Area (PHT): 2.73 cm    SHUNTS MV Decel Time: 278 msec    Systemic VTI:  0.17 m MV E velocity: 65.00 cm/s  Systemic Diam: 2.40 cm MV A velocity: 80.50 cm/s MV E/A ratio:  0.81 Candee Furbish MD Electronically signed by Candee Furbish MD Signature Date/Time: 05/19/2021/10:37:57 AM    Final    [place] AXILLA RIGHT  Result Date: 06/01/2021 CLINICAL DATA:  Right axillary abscess. EXAM: ULTRASOUND OF THE RIGHT AXILLA COMPARISON:  Chest CTA dated 05/29/2021. FINDINGS: Ultrasound is performed, showing an irregular fluid collection in the right axilla measuring 8.4 x 5.9 x 1.9 cm. This contains some thin internal septations. This also has some internal solid appearing echogenic components or invaginations of fat. IMPRESSION: 8.4 x 5.9 x 1.9 cm irregular, mildly complicated fluid collection in the right axilla. This is compatible with the patient's known abscess. Electronically Signed   By: Claudie Revering M.D.   On: 06/01/2021 10:13   ECHO TEE  Result Date: 06/06/2021    TRANSESOPHOGEAL ECHO REPORT   Patient Name:   Aaron WIMBERLY Sr. Date of Exam: 06/06/2021 Medical Rec #:  [id number]          Height:       72.0 in Accession #:    [id number]         Weight:       177.5 lb Date of Birth:  August 12, 1955         BSA:          2.025 m Patient Age:    64 years            BP:           123/87 mmHg Patient Gender: M                  HR:           79 bpm. Exam Location:  Inpatient Procedure: Transesophageal Echo and Color Doppler Indications:     Bacteremia  History:         Patient has prior history of Echocardiogram examinations, most                  recent 05/31/2021. Abnormal ECG, COPD, Arrythmias:Atrial                  Fibrillation, Signs/Symptoms:Syncope and Bacteremia; Risk                  Factors:Hypertension, Diabetes, Dyslipidemia and Current                  Smoker. Covid positive.  Sonographer:     Roseanna Rainbow RDCS Referring Phys:  [place] Diagnosing Phys: Kirk Ruths MD  Sonographer Comments: Technically difficult study due to poor echo windows. PROCEDURE: After discussion of the risks and benefits of a TEE, an informed consent was obtained from the patient. The transesophogeal probe was passed without difficulty through the esophogus of the patient. Imaged were obtained with the patient in a left lateral decubitus position. Sedation performed by different physician. The patient was monitored while under deep sedation. Anesthestetic sedation was provided intravenously by Anesthesiology: 120m of Propofol, 620mof Lidocaine. The patient's vital signs; including  heart rate, blood pressure, and oxygen saturation; remained stable throughout the procedure. The patient developed no complications during the procedure. IMPRESSIONS  1. No vegetations; moderately dilated aortic root; suggest CTA or MRA to further assess.  2. Left ventricular ejection fraction, by estimation, is 55 to 60%. The left ventricle has normal function. The left ventricle has no regional wall motion abnormalities.  3. Right ventricular systolic function is normal. The right ventricular size is normal.  4. No left atrial/left atrial appendage thrombus was detected.  5. The mitral valve is normal in structure. Trivial mitral valve regurgitation.  6. The aortic valve is tricuspid. Aortic  valve regurgitation is trivial.  7. Aortic dilatation noted. There is moderate dilatation of the aortic root, measuring 46 mm. FINDINGS  Left Ventricle: Left ventricular ejection fraction, by estimation, is 55 to 60%. The left ventricle has normal function. The left ventricle has no regional wall motion abnormalities. The left ventricular internal cavity size was normal in size. Right Ventricle: The right ventricular size is normal. Right ventricular systolic function is normal. Left Atrium: Left atrial size was normal in size. No left atrial/left atrial appendage thrombus was detected. Right Atrium: Right atrial size was normal in size. Pericardium: There is no evidence of pericardial effusion. Mitral Valve: The mitral valve is normal in structure. Trivial mitral valve regurgitation. Tricuspid Valve: The tricuspid valve is normal in structure. Tricuspid valve regurgitation is trivial. Aortic Valve: The aortic valve is tricuspid. Aortic valve regurgitation is trivial. Pulmonic Valve: The pulmonic valve was normal in structure. Pulmonic valve regurgitation is trivial. Aorta: Aortic dilatation noted. There is moderate dilatation of the aortic root, measuring 46 mm. IAS/Shunts: No atrial level shunt detected by color flow Doppler. Additional Comments: No vegetations; moderately dilated aortic root; suggest CTA or MRA to further assess. Kirk Ruths MD Electronically signed by Kirk Ruths MD Signature Date/Time: 06/06/2021/1:17:06 PM    Final    ECHOCARDIOGRAM LIMITED  Result Date: 05/31/2021    ECHOCARDIOGRAM LIMITED REPORT   Patient Name:   Aaron LOPPNOW Sr. Date of Exam: 05/31/2021 Medical Rec #:  [id number]          Height:       72.0 in Accession #:    [id number]         Weight:       177.5 lb Date of Birth:  1956-04-17         BSA:          2.025 m Patient Age:    67 years           BP:           112/79 mmHg Patient Gender: M                  HR:           82 bpm. Exam Location:  Inpatient Procedure:  Limited Echo and Cardiac Doppler Indications:    Bacteremia R78.81  History:        Patient has prior history of Echocardiogram examinations, most                 recent 05/19/2021. COPD; Risk Factors:Current Smoker,                 Hypertension, Dyslipidemia and Diabetes. COVID 19. Lymphoma.                 GERD.  Sonographer:    Darlina Sicilian RDCS Referring Phys: [place]  1. Left ventricular ejection fraction, by estimation, is 55%. The left ventricle has normal function. The left ventricle has no regional wall motion abnormalities.  2. Right ventricular systolic function is normal. The right ventricular size is normal.  3. The mitral valve is normal in structure. No evidence of mitral valve regurgitation.  4. The aortic valve was not well visualized. Aortic valve regurgitation is not visualized.  5. Limited study for evalation of infective endocarditis. No valve lesions seen, but aortic valve and pulmonic valve note well visualized. Comparison(s): No significant change from prior study. Conclusion(s)/Recommendation(s): No evidence of valvular vegetations on this transthoracic echocardiogram. Consider a transesophageal echocardiogram to exclude infective endocarditis if clinically indicated. FINDINGS  Left Ventricle: Left ventricular ejection fraction, by estimation, is 55%. The left ventricle has normal function. The left ventricle has no regional wall motion abnormalities. Right Ventricle: The right ventricular size is normal. Right ventricular systolic function is normal. Mitral Valve: The mitral valve is normal in structure. Tricuspid Valve: The tricuspid valve is normal in structure. Tricuspid valve regurgitation is not demonstrated. No evidence of tricuspid stenosis. Aortic Valve: The aortic valve was not well visualized. Aortic valve regurgitation is not visualized. Pulmonic Valve: The pulmonic valve was not well visualized. Pulmonic valve regurgitation is not visualized. No evidence  of pulmonic stenosis. Rudean Haskell MD Electronically signed by Rudean Haskell MD Signature Date/Time: 05/31/2021/3:41:42 PM    Final      Time coordinating discharge: Over 16 minutes    Dwyane Dee, MD  [facility] 06/09/2021, 6:57 PM

## 2021-06-09 NOTE — Progress Notes (Signed)
Patient was recently discharged from the hospital after being admitted for sepsis related to probable central line infection. Port dc'd while inpatient.   Per Dr Marin Olp, he would like patient to be treated 2/10 and will need a PICC placed. Appointment scheduled and order for PICC placement placed.  Called and spoke to patient's wife, Butch Penny. She is aware of appointment including date, time and location. Also informed her that order has been placed for PICC and IR will call her to schedule.   Answered questions about treatment, medications, and PICC line to her satisfaction.   Oncology Nurse Navigator Documentation  Oncology Nurse Navigator Flowsheets 06/09/2021  Abnormal Finding Date -  Confirmed Diagnosis Date -  Diagnosis Status -  Planned Course of Treatment -  Phase of Treatment -  Chemotherapy Actual Start Date: -  Chemotherapy Expected End Date: -  Navigator Follow Up Date: 06/17/2021  Navigator Follow Up Reason: Follow-up Appointment;Chemotherapy  Navigator Restaurant manager, fast food Encounter Type Appt/Treatment Plan Review;Telephone  Telephone Appt Confirmation/Clarification;Education;Outgoing Call  Treatment Initiated Date -  Patient Visit Type MedOnc  Treatment Phase Active Tx  Barriers/Navigation Needs Coordination of Care;Education  Education Other  Interventions Coordination of Care;Education;Psycho-Social Support  Acuity Level 2-Minimal Needs (1-2 Barriers Identified)  Coordination of Care Appts  Education Method Verbal;Teach-back  Support Groups/Services Friends and Family  Time Spent with Patient 10

## 2021-06-10 DIAGNOSIS — J9601 Acute respiratory failure with hypoxia: Secondary | ICD-10-CM | POA: Diagnosis not present

## 2021-06-10 DIAGNOSIS — D61810 Antineoplastic chemotherapy induced pancytopenia: Secondary | ICD-10-CM | POA: Diagnosis not present

## 2021-06-10 DIAGNOSIS — C8330 Diffuse large B-cell lymphoma, unspecified site: Secondary | ICD-10-CM | POA: Diagnosis not present

## 2021-06-10 DIAGNOSIS — R6510 Systemic inflammatory response syndrome (SIRS) of non-infectious origin without acute organ dysfunction: Secondary | ICD-10-CM | POA: Diagnosis not present

## 2021-06-10 DIAGNOSIS — A4102 Sepsis due to Methicillin resistant Staphylococcus aureus: Secondary | ICD-10-CM | POA: Diagnosis not present

## 2021-06-10 DIAGNOSIS — E1165 Type 2 diabetes mellitus with hyperglycemia: Secondary | ICD-10-CM | POA: Diagnosis not present

## 2021-06-10 NOTE — Progress Notes (Signed)
Rapid Infusion Rituximab Pharmacist Evaluation  BOHDI LEEDS Sr. is a 66 y.o. male being treated with rituximab for NHL. This patient may be considered for RIR.   A pharmacist has verified the patient tolerated rituximab infusions per the [facility] standard infusion protocol without grade 3-4 infusion reactions. The treatment plan will be updated to reflect RIR if the patient qualifies per the checklist below:   Age > 39 years old Yes   Clinically significant cardiovascular disease No   Circulating lymphocyte count < 5000/uL prior to cycle two Yes  Lab Results  Component Value Date   LYMPHSABS 0.5 (L) 06/08/2021    Prior documented grade 3-4 infusion reaction to rituximab No   Prior documented grade 1-2 infusion reaction to rituximab (If YES, Pharmacist will confirm with Physician if patient is still a candidate for RIR) No   Previous rituximab infusion within the past 6 months Yes   Treatment Plan updated orders to reflect RIR Yes    [name] does meet the criteria for Rapid Infusion Rituximab. This patient is going to be switched to rapid infusion rituximab.   Jameka Ivie, Jacqlyn Larsen 06/10/21 10:54 AM

## 2021-06-12 NOTE — Anesthesia Postprocedure Evaluation (Signed)
Anesthesia Post Note  Patient: Aaron DUPRIEST Sr.  Procedure(s) Performed: TRANSESOPHAGEAL ECHOCARDIOGRAM (TEE)     Patient location during evaluation: Endoscopy Anesthesia Type: MAC Level of consciousness: awake and alert Pain management: pain level controlled Vital Signs Assessment: post-procedure vital signs reviewed and stable Respiratory status: spontaneous breathing, nonlabored ventilation, respiratory function stable and patient connected to nasal cannula oxygen Cardiovascular status: stable and blood pressure returned to baseline Postop Assessment: no apparent nausea or vomiting Anesthetic complications: no   No notable events documented.  Last Vitals:  Vitals:   06/08/21 1240 06/08/21 1259  BP: (!) 86/53 99/77  Pulse: 66 64  Resp: 15 20  Temp:  36.8 C  SpO2: 100% (!) 87%    Last Pain:  Vitals:   06/08/21 1259  TempSrc: Oral  PainSc:                  Aaron Weaver

## 2021-06-12 NOTE — Anesthesia Preprocedure Evaluation (Signed)
Anesthesia Evaluation  Patient identified by MRN, date of birth, ID band Patient awake    Reviewed: Allergy & Precautions, NPO status , Patient's Chart, lab work & pertinent test results  History of Anesthesia Complications (+) history of anesthetic complications  Airway Mallampati: II  TM Distance: >3 FB Neck ROM: Full    Dental  (+) Dental Advisory Given   Pulmonary neg sleep apnea, COPD, Recent URI , former smoker,  Covid-19 Nucleic Acid Test Results Lab Results      Component                Value               Date                      [place] (A)        05/29/2021                [place]              NEGATIVE            11/23/2020                [place]              NEGATIVE            07/10/2020                [place]              NEGATIVE            09/08/2019              breath sounds clear to auscultation       Cardiovascular hypertension, Pt. on medications and Pt. on home beta blockers (-) angina+ dysrhythmias  Rhythm:Regular     Neuro/Psych PSYCHIATRIC DISORDERS Anxiety negative neurological ROS     GI/Hepatic Neg liver ROS, GERD  ,  Endo/Other  diabetes  Renal/GU ARFRenal disease     Musculoskeletal   Abdominal   Peds  Hematology  (+) anemia ,   Anesthesia Other Findings   Reproductive/Obstetrics                             Anesthesia Physical Anesthesia Plan  ASA: 3  Anesthesia Plan: MAC   Post-op Pain Management: Minimal or no pain anticipated   Induction: Intravenous  PONV Risk Score and Plan: 1 and Treatment may vary due to age or medical condition  Airway Management Planned: Nasal Cannula  Additional Equipment: None  Intra-op Plan:   Post-operative Plan:   Informed Consent: I have reviewed the patients History and Physical, chart, labs and discussed the procedure including the risks, benefits and alternatives for  the proposed anesthesia with the patient or authorized representative who has indicated his/her understanding and acceptance.     Dental advisory given  Plan Discussed with: CRNA and Anesthesiologist  Anesthesia Plan Comments:         Anesthesia Quick Evaluation

## 2021-06-14 ENCOUNTER — Other Ambulatory Visit: Payer: Self-pay

## 2021-06-14 ENCOUNTER — Encounter: Payer: Self-pay | Admitting: Physician Assistant

## 2021-06-14 ENCOUNTER — Other Ambulatory Visit: Payer: Self-pay | Admitting: Internal Medicine

## 2021-06-14 ENCOUNTER — Ambulatory Visit: Payer: Medicare Other | Admitting: Physician Assistant

## 2021-06-14 ENCOUNTER — Ambulatory Visit (INDEPENDENT_AMBULATORY_CARE_PROVIDER_SITE_OTHER): Payer: Medicare Other

## 2021-06-14 VITALS — BP 122/80 | HR 65 | Ht 72.0 in | Wt 176.4 lb

## 2021-06-14 DIAGNOSIS — I7143 Infrarenal abdominal aortic aneurysm, without rupture: Secondary | ICD-10-CM | POA: Diagnosis not present

## 2021-06-14 DIAGNOSIS — I480 Paroxysmal atrial fibrillation: Secondary | ICD-10-CM | POA: Diagnosis not present

## 2021-06-14 DIAGNOSIS — I10 Essential (primary) hypertension: Secondary | ICD-10-CM

## 2021-06-14 DIAGNOSIS — E782 Mixed hyperlipidemia: Secondary | ICD-10-CM | POA: Diagnosis not present

## 2021-06-14 DIAGNOSIS — I4891 Unspecified atrial fibrillation: Secondary | ICD-10-CM | POA: Diagnosis not present

## 2021-06-14 DIAGNOSIS — I77810 Thoracic aortic ectasia: Secondary | ICD-10-CM | POA: Insufficient documentation

## 2021-06-14 LAB — SURGICAL PATHOLOGY

## 2021-06-14 NOTE — Assessment & Plan Note (Signed)
This was noted on TEE and felt to be 46 mm.  However, discharge notes indicate that the attending physician did speak with radiology and the CT scan done on the patient when he was admitted demonstrated an aortic root of 35 mm.  He does not require any follow-up CT scan at this time.  We will need to consider repeating his echo in 1 year to reassess.

## 2021-06-14 NOTE — Progress Notes (Unsigned)
[id number] 14 day ZIO AT applied in office

## 2021-06-14 NOTE — Progress Notes (Signed)
Cardiology Office Note:    Date:  06/14/2021   ID:  Aaron Fee Sr., DOB [birth year], MRN [id number]  PCP:  [name], MD  [facility] Providers Cardiologist:  Candee Furbish, MD    Referring MD: [name], MD   Chief Complaint:  Hospitalization Follow-up (Atrial fibrillation in the setting of MRSA bacteremia)    Patient Profile: Hypertension Hyperlipidemia Diabetes mellitus AAA s/p EVAR COPD Diffuse large B-cell lymphoma Aortic atherosclerosis Paroxysmal atrial fibrillation Occurring in the setting of acute illness (MRSA bacteremia) in 1/23  Prior CV Studies: TEE 06/06/2021 EF 55-60, no RWMA, normal RVSF, no vegetations, aortic root 46 mm  Echocardiogram 05/31/2021 EF 55, no RWMA, normal RVSF,  Chest CTA 05/29/2021 Aortic atherosclerosis  Echocardiogram 05/19/2021 EF 60, G1 DD, GLS -22.2, normal RVSF, trivial AI    History of Present Illness:   Aaron EASTMAN Sr. is a 66 y.o. male with the above problem list.  He was admitted 1/22-2/1 with MRSA bacteremia.  He had an axillary abscess which grew out MRSA.  His Port-A-Cath tip also grew out MRSA.  This was removed.  His hospital course was complicated by AKI, incidental COVID and SIADH.  He also had brief atrial fibrillation with rapid ventricular rate.  He was placed on amiodarone with restoration of normal sinus rhythm.  Amiodarone was ultimately discontinued and he was placed back on his home beta-blocker dose.  A TEE was performed to rule out vegetation.  This suggested dilated aortic root at 46 mm.  The discharge notes indicate that he is chest CTA from admission was reviewed with radiology.  His aortic root was felt to be 35 mm on that study.  He returns for follow-up.  He is here with his wife.  He is feeling better.  He has chronic shortness of breath related to COPD.  This is unchanged.  He has not had chest discomfort, syncope, orthopnea, leg edema.  He has not had rapid palpitations.        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[place]) 05/04/2021   Dysrhythmia    GERD (gastroesophageal reflux disease)    Impacted cerumen    Impotence of organic origin    Lumbago    Lung nodule    Other and unspecified hyperlipidemia    Routine general medical examination at a health care facility    Skipped heart beats    Tobacco use disorder    Type II or unspecified type diabetes mellitus without mention of complication, not stated as uncontrolled    type II   Unspecified essential hypertension    Current Medications: Current Meds  Medication Sig   acetaminophen (TYLENOL) 325 MG tablet Take 650 mg by mouth as needed for mild pain, moderate pain or headache.   albuterol (PROAIR HFA) 108 (90 Base) MCG/ACT inhaler Inhale 1-2 puffs into the lungs every 6 (six) hours as needed for wheezing or shortness of breath.   alendronate (FOSAMAX) 70 MG tablet Take 70 mg by mouth every Sunday.   allopurinol (ZYLOPRIM) 100 MG tablet Take 1 tablet (100 mg total) by mouth daily.  Start 3 days BEFORE chemotherapy starts   aspirin EC 81 MG EC tablet Take 1 tablet (81 mg total) by mouth daily at 6 (six) AM. Swallow whole.   atenolol (TENORMIN) 100 MG tablet Take 100 mg by mouth daily.   blood glucose meter kit and supplies KIT Dispense based on patient and insurance preference. Use up to four times daily as directed.   calcium carbonate (OSCAL) 1500 (600 Ca) MG TABS tablet Take 600 mg of elemental calcium by mouth daily with breakfast.   diclofenac Sodium (VOLTAREN) 1 % GEL Apply 2 g topically daily as needed (pain).   [START ON 06/22/2021] doxycycline (VIBRAMYCIN) 100 MG capsule Take 1 capsule (100 mg total) by mouth 2 (two)  times daily for 7 days. Start taking AFTER you complete linezolid (Zyvox)   DULoxetine (CYMBALTA) 60 MG capsule Take 60 mg by mouth at bedtime.   famciclovir (FAMVIR) 250 MG tablet Take 1 tablet (250 mg total) by mouth 2 (two) times daily.   Fish Oil OIL Take 1,500 mg by mouth daily.   insulin glargine (LANTUS) 100 UNIT/ML Solostar Pen Inject 18 Units into the skin daily.   insulin lispro (HUMALOG) 100 UNIT/ML KwikPen Inject 2-15 Units into the skin 4 (four) times daily - after meals and at bedtime. Glucose 121 - 150: 2 units, Glucose 151 - 200: 3 units, Glucose 201 - 250: 5 units, Glucose 251 - 300: 8 units, Glucose 301 - 350: 11 units, Glucose 351 - 400: 15 units, Glucose > 400 call MD   Insulin Pen Needle 32G X 4 MM MISC 1 each by Does not apply route 4 (four) times daily - after meals and at bedtime.   lidocaine-prilocaine (EMLA) cream Apply to affected area once   linezolid (ZYVOX) 600 MG tablet Take 1 tablet (600 mg total) by mouth 2 (two) times daily for 14 days.   LORazepam (ATIVAN) 0.5 MG tablet Take 1 tablet (0.5 mg total) by mouth every 8 (eight) hours. Take for nausea prn   omeprazole (PRILOSEC) 40 MG capsule Take 40 mg by mouth daily.   ondansetron (ZOFRAN) 8 MG tablet Take 1 tablet (8 mg total) by mouth 2 (two) times daily as needed for refractory nausea / vomiting. Start on day 3 after cyclophosphamide chemotherapy.   potassium chloride SA (KLOR-CON M20) 20 MEQ tablet TAKE 1 TABLET (20 MEQ TOTAL) BY MOUTH DAILY.    prochlorperazine (COMPAZINE) 10 MG tablet Take 1 tablet (10 mg total) by mouth every 6 (six) hours as needed (Nausea or vomiting).   rosuvastatin (CRESTOR) 10 MG tablet Take 10 mg by mouth daily.   SEREVENT DISKUS 50 MCG/ACT diskus inhaler 1 puff 2 (two) times daily.   tadalafil (CIALIS) 5 MG tablet Take 5 mg by mouth daily as needed for erectile dysfunction.   tamsulosin (FLOMAX) 0.4 MG CAPS capsule Take 0.4 mg by mouth every evening.   Zinc 50 MG TABS Take 50 mg by mouth  daily.    Allergies:   Fexofenadine, Hydrochlorothiazide, Penicillins, Pravastatin sodium, and Requip [ropinirole hcl]   Social History   Tobacco Use   Smoking status: Former    Packs/day: 0.50    Years: 53.00    Pack years: 26.50    Types: Cigarettes    Start date: 03/31/1965    Quit date: 07/22/2017    Years since quitting: 3.8   Smokeless tobacco: Never   Tobacco comments:    started at age 69.  Vaping Use   Vaping Use: Never used  Substance Use Topics   Alcohol use: Yes    Comment: rare beer once or twice   Drug use: No    Family Hx: The patient's family history includes Colon cancer (age of onset: 34) in his mother; Colon polyps in his brother; Coronary artery disease in an other family member; Hypertension in his father. There is no history of Esophageal cancer, Rectal cancer, or Stomach cancer.  Review of Systems  Gastrointestinal:  Negative for hematochezia.  Genitourinary:  Negative for hematuria.    EKGs/Labs/Other Test Reviewed:    EKG:  EKG is  ordered today.  The ekg ordered today demonstrates NSR, HR 65, normal axis, no ST-T wave change  Recent Labs: 05/31/2021: Magnesium 1.6 06/08/2021: ALT 52; BUN 17; Creatinine, Ser 0.88; Hemoglobin 11.9; Platelets 282; Potassium 4.0; Sodium 130   Recent Lipid Panel No results for input(s): CHOL, TRIG, HDL, VLDL, LDLCALC, LDLDIRECT in the last 8760 hours.   Risk Assessment/Calculations:    CHA2DS2-VASc Score = 4   This indicates a 4.8% annual risk of stroke. The patient's score is based upon: CHF History: 0 HTN History: 1 Diabetes History: 1 Stroke History: 0 Vascular Disease History: 1 Age Score: 1 Gender Score: 0        Physical Exam:    VS:  BP 122/80 (BP Location: Right Arm, Patient Position: Sitting, Cuff Size: Normal)    Pulse 65    Ht 6' (1.829 m)    Wt 176 lb 6.4 oz (80 kg)    SpO2 96%    BMI 23.92 kg/m     Wt Readings from Last 3 Encounters:  06/14/21 176 lb 6.4 oz (80 kg)  05/29/21 177 lb 7.5 oz  (80.5 kg)  05/13/21 198 lb (89.8 kg)    Constitutional:      Appearance: Healthy appearance. Not in distress.  Neck:     Vascular: JVD normal.  Pulmonary:     Effort: Pulmonary effort is normal.     Breath sounds: No wheezing. No rales.  Cardiovascular:     Normal rate. Regular rhythm. Normal S1. Normal S2.      Murmurs: There is no murmur.  Edema:    Peripheral edema absent.  Abdominal:     Palpations: Abdomen is soft.  Skin:    General: Skin is warm and dry.  Neurological:     General: No focal deficit present.     Mental Status: Alert and oriented to person, place and time.     Cranial Nerves: Cranial nerves are intact.        ASSESSMENT & PLAN:   Paroxysmal atrial fibrillation (HCC) This occurred in the setting of an acute illness.  He is maintaining sinus rhythm today.  He does not have a history of palpitations.  His CHA2DS2-VASc Score = 4 [CHF History: 0, HTN History: 1, Diabetes History: 1, Stroke History: 0, Vascular Disease History: 1, Age Score: 1, Gender Score: 0].  Therefore, the patient's annual risk of stroke is 4.8 %.  Therefore, if he had recurrent atrial fibrillation, he would need long-term anticoagulation.  Given his risk factors, I have recommended that we proceed with a 14-day ZIO monitor to assess for asymptomatic atrial fibrillation. Arrange 14-day ZIO AT Follow-up with Dr. Marlou Porch in 6 months  Essential hypertension Blood pressure is well controlled.  Continue atenolol 100 mg daily.  Hyperlipidemia Continue rosuvastatin 10 mg daily.  AAA (abdominal aortic aneurysm) without rupture Status post EVAR.  Continue follow-up with Dr. Carlis Abbott as planned.  Dilated  aortic root ([place]) This was noted on TEE and felt to be 46 mm.  However, discharge notes indicate that the attending physician did speak with radiology and the CT scan done on the patient when he was admitted demonstrated an aortic root of 35 mm.  He does not require any follow-up CT scan at this time.   We will need to consider repeating his echo in 1 year to reassess.           Dispo:  Return in about 6 months (around 12/12/2021) for Routine follow up in 6 months with Dr.Skains. .   Medication Adjustments/Labs and Tests Ordered: Current medicines are reviewed at length with the patient today.  Concerns regarding medicines are outlined above.  Tests Ordered: Orders Placed This Encounter  Procedures   LONG TERM MONITOR-LIVE TELEMETRY (3-14 DAYS)   EKG 12-Lead   Medication Changes: No orders of the defined types were placed in this encounter.  Signed, Richardson Dopp, PA-C  06/14/2021 5:29 PM    [facility] [address], [place], [place]  [address] Phone: [phone]; Fax: [phone]

## 2021-06-14 NOTE — Assessment & Plan Note (Signed)
Status post EVAR.  Continue follow-up with Dr. Carlis Abbott as planned.

## 2021-06-14 NOTE — Assessment & Plan Note (Signed)
This occurred in the setting of an acute illness.  He is maintaining sinus rhythm today.  He does not have a history of palpitations.  His CHA2DS2-VASc Score = 4 [CHF History: 0, HTN History: 1, Diabetes History: 1, Stroke History: 0, Vascular Disease History: 1, Age Score: 1, Gender Score: 0].  Therefore, the patient's annual risk of stroke is 4.8 %.  Therefore, if he had recurrent atrial fibrillation, he would need long-term anticoagulation.  Given his risk factors, I have recommended that we proceed with a 14-day ZIO monitor to assess for asymptomatic atrial fibrillation.  Arrange 14-day ZIO AT  Follow-up with Dr. Marlou Porch in 6 months

## 2021-06-14 NOTE — Assessment & Plan Note (Signed)
Continue rosuvastatin 10 mg daily.

## 2021-06-14 NOTE — Patient Instructions (Signed)
Medication Instructions:   Your physician recommends that you continue on your current medications as directed. Please refer to the Current Medication list given to you today.   *If you need a refill on your cardiac medications before your next appointment, please call your pharmacy*   Lab Work:  None ordered.   If you have labs (blood work) drawn today and your tests are completely normal, you will receive your results only by: [place] (if you have MyChart) OR A paper copy in the mail If you have any lab test that is abnormal or we need to change your treatment, we will call you to review the results.   Testing/Procedures:  ZIO AT Long term monitor-Live Telemetry  Your physician has requested you wear a ZIO patch monitor for 14 days.  This is a single patch monitor. [employer] supplies one patch monitor per enrollment. Additional  stickers are not available.  Please do not apply patch if you will be having a Nuclear Stress Test, Echocardiogram, Cardiac CT, MRI,  or Chest Xray during the period you would be wearing the monitor. The patch cannot be worn during  these tests. You cannot remove and re-apply the ZIO AT patch monitor.  Your ZIO patch monitor will be mailed 3 day USPS to your address on file. It may take 3-5 days to  receive your monitor after you have been enrolled.  Once you have received your monitor, please review the enclosed instructions. Your monitor has  already been registered assigning a specific monitor serial # to you.   Billing and Patient Assistance Program information  Theodore Demark has been supplied with any insurance information on record for billing. [employer] offers a sliding scale Patient Assistance Program for patients without insurance, or whose  insurance does not completely cover the cost of the ZIO patch monitor. You must apply for the  Patient Assistance Program to qualify for the discounted rate. To apply, call [employer] at [phone],  select  option 4, select option 2 , ask to apply for the Patient Assistance Program, (you can request an  interpreter if needed). [employer] will ask your household income and how many people are in your  household. [employer] will quote your out-of-pocket cost based on this information. They will also be able  to set up a 12 month interest free payment plan if needed.  Applying the monitor   Shave hair from upper left chest.  Hold the abrader disc by orange tab. Rub the abrader in 40 strokes over left upper chest as indicated in  your monitor instructions.  Clean area with 4 enclosed alcohol pads. Use all pads to ensure the area is cleaned thoroughly. Let  dry.  Apply patch as indicated in monitor instructions. Patch will be placed under collarbone on left side of  chest with arrow pointing upward.  Rub patch adhesive wings for 2 minutes. Remove the white label marked "1". Remove the white label  marked "2". Rub patch adhesive wings for 2 additional minutes.  While looking in a mirror, press and release button in center of patch. A small green light will flash 3-4  times. This will be your only indicator that the monitor has been turned on.  Do not shower for the first 24 hours. You may shower after the first 24 hours.  Press the button if you feel a symptom. You will hear a small click. Record Date, Time and Symptom in  the Patient Log.   Starting the [place]  In your  kit there is a small plastic box the size of a cellphone. This is Airline pilot. It transmits all your  recorded data to [facility]. This box must always stay within 10 feet of you. Open the box and push the *  button. There will be a light that blinks orange and then green a few times. When the light stops  blinking, the Gateway is connected to the ZIO patch. Call [employer] at [phone] to confirm your monitor is transmitting.  Returning your monitor  Remove your patch and place it inside the [place]. In the lower half of the Gateway  there is a white  bag with prepaid postage on it. Place Gateway in bag and seal. Mail package back to [place] as soon as  possible. Your physician should have your final report approximately 7 days after you have mailed back  your monitor. Call [facility] at [phone] if you have questions regarding your ZIO AT  patch monitor. Call them immediately if you see an orange light blinking on your monitor.  If your monitor falls off in less than 4 days, contact our Monitor department at [phone]. If your  monitor becomes loose or falls off after 4 days call [employer] at [phone] for suggestions on  securing your monitor    Follow-Up: At [facility], you and your health needs are our priority.  As part of our continuing mission to provide you with exceptional heart care, we have created designated Provider Care Teams.  These Care Teams include your primary Cardiologist (physician) and Advanced Practice Providers (APPs -  Physician Assistants and Nurse Practitioners) who all work together to provide you with the care you need, when you need it.  We recommend signing up for the patient portal called "MyChart".  Sign up information is provided on this After Visit Summary.  MyChart is used to connect with patients for Virtual Visits (Telemedicine).  Patients are able to view lab/test results, encounter notes, upcoming appointments, etc.  Non-urgent messages can be sent to your provider as well.   To learn more about what you can do with MyChart, go to [email or web address].    Your next appointment:   6 week(s)  The format for your next appointment:   In Person  Provider:   Candee Furbish, MD     Other Instructions  Your physician wants you to follow-up in:  6 months with Dr.Skains.  You will receive a reminder letter in the mail two months in advance. If you don't receive a letter, please call our office to schedule the follow-up appointment.

## 2021-06-14 NOTE — Assessment & Plan Note (Addendum)
Blood pressure is well controlled.  Continue atenolol 100 mg daily.

## 2021-06-15 ENCOUNTER — Other Ambulatory Visit (HOSPITAL_COMMUNITY): Payer: Self-pay | Admitting: Physician Assistant

## 2021-06-15 DIAGNOSIS — I4891 Unspecified atrial fibrillation: Secondary | ICD-10-CM | POA: Diagnosis not present

## 2021-06-16 ENCOUNTER — Ambulatory Visit (HOSPITAL_COMMUNITY)
Admission: RE | Admit: 2021-06-16 | Discharge: 2021-06-16 | Disposition: A | Discharge status: Home / Self Care | Payer: Medicare Other | Source: Ambulatory Visit | Attending: Hematology & Oncology | Admitting: Hematology & Oncology

## 2021-06-16 ENCOUNTER — Other Ambulatory Visit: Payer: Self-pay

## 2021-06-16 ENCOUNTER — Other Ambulatory Visit: Payer: Self-pay | Admitting: Hematology & Oncology

## 2021-06-16 DIAGNOSIS — C8338 Diffuse large B-cell lymphoma, lymph nodes of multiple sites: Secondary | ICD-10-CM

## 2021-06-16 DIAGNOSIS — Z452 Encounter for adjustment and management of vascular access device: Secondary | ICD-10-CM | POA: Diagnosis not present

## 2021-06-16 DIAGNOSIS — C8330 Diffuse large B-cell lymphoma, unspecified site: Secondary | ICD-10-CM | POA: Diagnosis not present

## 2021-06-16 HISTORY — PX: IR US GUIDE VASC ACCESS LEFT: IMG2389

## 2021-06-16 HISTORY — PX: IR FLUORO GUIDE CV LINE RIGHT: IMG2283

## 2021-06-16 MED ORDER — LIDOCAINE HCL (PF) 1 % IJ SOLN
INTRAMUSCULAR | Status: DC | PRN
  Administered 2021-06-16: 5 mL via SUBCUTANEOUS

## 2021-06-16 MED ORDER — HEPARIN SOD (PORK) LOCK FLUSH 100 UNIT/ML IV SOLN
INTRAVENOUS | Status: AC
  Filled 2021-06-16: qty 5

## 2021-06-16 MED ORDER — LIDOCAINE HCL 1 % IJ SOLN
INTRAMUSCULAR | Status: AC
  Filled 2021-06-16: qty 20

## 2021-06-16 NOTE — Procedures (Signed)
Left double-lumen basilic vein PICC placed.  Length 43 cm.  Tip SVC/right atrial junction.  EBL < 2 cc.  No immediate complications.  Medication used-1% lidocaine to skin and subcutaneous tissue.

## 2021-06-17 ENCOUNTER — Inpatient Hospital Stay: Payer: Medicare Other

## 2021-06-17 ENCOUNTER — Encounter: Payer: Self-pay | Admitting: *Deleted

## 2021-06-17 ENCOUNTER — Encounter: Payer: Self-pay | Admitting: Hematology & Oncology

## 2021-06-17 ENCOUNTER — Other Ambulatory Visit: Payer: Self-pay

## 2021-06-17 ENCOUNTER — Other Ambulatory Visit: Payer: Self-pay | Admitting: *Deleted

## 2021-06-17 ENCOUNTER — Other Ambulatory Visit (HOSPITAL_BASED_OUTPATIENT_CLINIC_OR_DEPARTMENT_OTHER): Payer: Self-pay

## 2021-06-17 ENCOUNTER — Inpatient Hospital Stay: Payer: Medicare Other | Attending: Physician Assistant

## 2021-06-17 ENCOUNTER — Inpatient Hospital Stay (HOSPITAL_BASED_OUTPATIENT_CLINIC_OR_DEPARTMENT_OTHER): Payer: Medicare Other | Admitting: Hematology & Oncology

## 2021-06-17 VITALS — BP 103/63 | HR 71 | Temp 98.0°F | Resp 16 | Ht 72.0 in | Wt 180.0 lb

## 2021-06-17 VITALS — BP 103/62 | HR 72 | Temp 98.6°F | Resp 20 | Ht 72.0 in | Wt 180.0 lb

## 2021-06-17 DIAGNOSIS — C8338 Diffuse large B-cell lymphoma, lymph nodes of multiple sites: Secondary | ICD-10-CM | POA: Diagnosis not present

## 2021-06-17 DIAGNOSIS — I714 Abdominal aortic aneurysm, without rupture, unspecified: Secondary | ICD-10-CM | POA: Diagnosis not present

## 2021-06-17 DIAGNOSIS — Z5189 Encounter for other specified aftercare: Secondary | ICD-10-CM | POA: Insufficient documentation

## 2021-06-17 DIAGNOSIS — Z87891 Personal history of nicotine dependence: Secondary | ICD-10-CM | POA: Insufficient documentation

## 2021-06-17 DIAGNOSIS — Z5111 Encounter for antineoplastic chemotherapy: Secondary | ICD-10-CM | POA: Insufficient documentation

## 2021-06-17 DIAGNOSIS — C8330 Diffuse large B-cell lymphoma, unspecified site: Secondary | ICD-10-CM | POA: Diagnosis not present

## 2021-06-17 DIAGNOSIS — Z5112 Encounter for antineoplastic immunotherapy: Secondary | ICD-10-CM | POA: Diagnosis not present

## 2021-06-17 LAB — CBC WITH DIFFERENTIAL (CANCER CENTER ONLY)
Abs Immature Granulocytes: 0.02 10*3/uL (ref 0.00–0.07)
Basophils Absolute: 0.1 10*3/uL (ref 0.0–0.1)
Basophils Relative: 1 %
Eosinophils Absolute: 0 10*3/uL (ref 0.0–0.5)
Eosinophils Relative: 1 %
HCT: 32.6 % — ABNORMAL LOW (ref 39.0–52.0)
Hemoglobin: 10.9 g/dL — ABNORMAL LOW (ref 13.0–17.0)
Immature Granulocytes: 0 %
Lymphocytes Relative: 8 %
Lymphs Abs: 0.5 10*3/uL — ABNORMAL LOW (ref 0.7–4.0)
MCH: 28.7 pg (ref 26.0–34.0)
MCHC: 33.4 g/dL (ref 30.0–36.0)
MCV: 85.8 fL (ref 80.0–100.0)
Monocytes Absolute: 0.5 10*3/uL (ref 0.1–1.0)
Monocytes Relative: 9 %
Neutro Abs: 5.1 10*3/uL (ref 1.7–7.7)
Neutrophils Relative %: 81 %
Platelet Count: 226 10*3/uL (ref 150–400)
RBC: 3.8 MIL/uL — ABNORMAL LOW (ref 4.22–5.81)
RDW: 15.3 % (ref 11.5–15.5)
WBC Count: 6.3 10*3/uL (ref 4.0–10.5)
nRBC: 0 % (ref 0.0–0.2)

## 2021-06-17 LAB — CMP (CANCER CENTER ONLY)
ALT: 25 U/L (ref 0–44)
AST: 20 U/L (ref 15–41)
Albumin: 3.4 g/dL — ABNORMAL LOW (ref 3.5–5.0)
Alkaline Phosphatase: 78 U/L (ref 38–126)
Anion gap: 9 (ref 5–15)
BUN: 20 mg/dL (ref 8–23)
CO2: 25 mmol/L (ref 22–32)
Calcium: 8.6 mg/dL — ABNORMAL LOW (ref 8.9–10.3)
Chloride: 103 mmol/L (ref 98–111)
Creatinine: 0.94 mg/dL (ref 0.61–1.24)
GFR, Estimated: >60 mL/min (ref >60)
Glucose, Bld: 338 mg/dL — ABNORMAL HIGH (ref 70–99)
Potassium: 4.3 mmol/L (ref 3.5–5.1)
Sodium: 137 mmol/L (ref 135–145)
Total Bilirubin: 0.7 mg/dL (ref 0.3–1.2)
Total Protein: 6 g/dL — ABNORMAL LOW (ref 6.5–8.1)

## 2021-06-17 LAB — HEPATITIS B SURFACE ANTIGEN: Hepatitis B Surface Ag: NONREACTIVE

## 2021-06-17 LAB — HEPATITIS B CORE ANTIBODY, TOTAL: Hep B Core Total Ab: NONREACTIVE

## 2021-06-17 MED ORDER — SODIUM CHLORIDE 0.9 % IV SOLN
750.0000 mg/m2 | Freq: Once | INTRAVENOUS | Status: AC
  Administered 2021-06-17: 1600 mg via INTRAVENOUS
  Filled 2021-06-17: qty 80

## 2021-06-17 MED ORDER — DOXORUBICIN HCL CHEMO IV INJECTION 2 MG/ML
50.0000 mg/m2 | Freq: Once | INTRAVENOUS | Status: AC
  Administered 2021-06-17: 106 mg via INTRAVENOUS
  Filled 2021-06-17: qty 53

## 2021-06-17 MED ORDER — PALONOSETRON HCL INJECTION 0.25 MG/5ML
0.2500 mg | Freq: Once | INTRAVENOUS | Status: AC
  Administered 2021-06-17: 0.25 mg via INTRAVENOUS
  Filled 2021-06-17: qty 5

## 2021-06-17 MED ORDER — HEPARIN SOD (PORK) LOCK FLUSH 100 UNIT/ML IV SOLN: 250.0000 [IU] | Freq: Once | INTRAVENOUS | Status: DC | PRN

## 2021-06-17 MED ORDER — SODIUM CHLORIDE 0.9% FLUSH
10.0000 mL | INTRAVENOUS | 1 refills | Status: DC
  Filled 2021-06-17: qty 60, 14d supply, fill #0

## 2021-06-17 MED ORDER — SODIUM CHLORIDE 0.9% FLUSH
10.0000 mL | Freq: Once | INTRAVENOUS | Status: AC
  Administered 2021-06-17: 10 mL via INTRAVENOUS

## 2021-06-17 MED ORDER — HEPARIN SOD (PORK) LOCK FLUSH 100 UNIT/ML IV SOLN
INTRAVENOUS | 1 refills | Status: DC
  Filled 2021-06-17: qty 120, 56d supply, fill #0

## 2021-06-17 MED ORDER — ACETAMINOPHEN 325 MG PO TABS
650.0000 mg | ORAL_TABLET | Freq: Once | ORAL | Status: AC
  Administered 2021-06-17: 650 mg via ORAL
  Filled 2021-06-17: qty 2

## 2021-06-17 MED ORDER — SODIUM CHLORIDE 0.9 % IV SOLN: Freq: Once | INTRAVENOUS | Status: AC

## 2021-06-17 MED ORDER — SODIUM CHLORIDE 0.9% FLUSH: 10.0000 mL | INTRAVENOUS | Status: DC | PRN

## 2021-06-17 MED ORDER — PREDNISONE 20 MG PO TABS: 60.0000 mg | ORAL_TABLET | ORAL | 3 refills | Status: DC

## 2021-06-17 MED ORDER — SODIUM CHLORIDE 0.9 % IV SOLN
150.0000 mg | Freq: Once | INTRAVENOUS | Status: AC
  Administered 2021-06-17: 150 mg via INTRAVENOUS
  Filled 2021-06-17: qty 5

## 2021-06-17 MED ORDER — SODIUM CHLORIDE 0.9 % IV SOLN
10.0000 mg | Freq: Once | INTRAVENOUS | Status: AC
  Administered 2021-06-17: 10 mg via INTRAVENOUS
  Filled 2021-06-17: qty 10

## 2021-06-17 MED ORDER — VINCRISTINE SULFATE CHEMO INJECTION 1 MG/ML
2.0000 mg | Freq: Once | INTRAVENOUS | Status: AC
  Administered 2021-06-17: 2 mg via INTRAVENOUS
  Filled 2021-06-17: qty 2

## 2021-06-17 MED ORDER — DIPHENHYDRAMINE HCL 25 MG PO CAPS
50.0000 mg | ORAL_CAPSULE | Freq: Once | ORAL | Status: AC
  Administered 2021-06-17: 50 mg via ORAL
  Filled 2021-06-17: qty 2

## 2021-06-17 MED ORDER — SODIUM CHLORIDE 0.9 % IV SOLN
375.0000 mg/m2 | Freq: Once | INTRAVENOUS | Status: AC
  Administered 2021-06-17: 800 mg via INTRAVENOUS
  Filled 2021-06-17: qty 50

## 2021-06-17 MED ORDER — HEPARIN SOD (PORK) LOCK FLUSH 100 UNIT/ML IV SOLN
500.0000 [IU] | Freq: Once | INTRAVENOUS | Status: AC
  Administered 2021-06-17: 250 [IU] via INTRAVENOUS

## 2021-06-17 NOTE — Progress Notes (Signed)
Patient is here to begin cycle two of treatment. He was admitted to the hospital after his first cycle for sepsis. He says he is feeling so much better today. He is eating and drinking well. His energy is improving, but not yet to baseline. He had his port removed due to infection, and had a PICC line placed yesterday. We spoke about PICC line maintenance. He would like his wife to flush the PICC when possible, and he will come into the office once a week for dressing changes. These made and calendar printed for him.   Prescription for central line flushes sent to our pharmacy downstairs. Patient will need to return to the office on Monday for his injection. By this time we should know the availability and cost of the flushes through pharmacy.   Oncology Nurse Navigator Documentation  Oncology Nurse Navigator Flowsheets 06/17/2021  Abnormal Finding Date -  Confirmed Diagnosis Date -  Diagnosis Status -  Planned Course of Treatment -  Phase of Treatment -  Chemotherapy Actual Start Date: -  Chemotherapy Expected End Date: -  Navigator Follow Up Date: -  Navigator Follow Up Reason: Follow-up Appointment;Chemotherapy  Production assistant, radio Encounter Type Treatment  Telephone -  Treatment Initiated Date -  Patient Visit Type MedOnc  Treatment Phase Active Tx  Barriers/Navigation Needs Coordination of Care;Education  Education Other  Interventions Coordination of Care;Education;Psycho-Social Support  Acuity Level 2-Minimal Needs (1-2 Barriers Identified)  Coordination of Care Appts  Education Method Verbal;Written  Support Groups/Services Friends and Family  Time Spent with Patient 74

## 2021-06-17 NOTE — Progress Notes (Signed)
Hematology and Oncology Follow Up Visit  Aaron DAVIDIAN Sr. [id number] 1955-12-07 66 y.o. 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  Is been quite a while since we saw him.  Unfortunately, everything really went downhill for him after his treatment.  He has first treatment about a month ago.  He actually did well with this.  However, he then developed MRSA bacteremia.  This was secondary to  abscess in the right axilla.  This is where he had a lymph node removed.  This unfortunately affected his Port-A-Cath.  The Port-A-Cath had to come out.  If he is hospitalized probably for almost 3 weeks.  He now has a PICC line in it.  He is about to finish up his oral antibiotics.  He does have a Holter monitor on right now to see if he has any issues with paroxysmal atrial fibrillation.  He feels well.  The abscess has healed up.  This was opened up in the hospital and drained.  He has had no fever.  He has had no cough or shortness of breath.  He has had no nausea or vomiting.  There is been no change in bowel or bladder habits.  While in the hospital, he did have a bone marrow biopsy done.  This was done on 06/08/2021.  The pathology report ([id number]) did not show any lymphoma in the bone marrow.  I still say he has fairly high risk lymphoma.  He has a relatively high IPI score.  He has had no problems with leg swelling.  He has had no rashes.  There is been no neuropathy although he does have some diabetes.  Currently, his performance status is ECOG 1.  Medications:  Current Outpatient Medications:    acetaminophen (TYLENOL) 325 MG tablet, Take 650 mg by mouth as needed for mild pain, moderate pain or headache., Disp: , Rfl:    albuterol (PROAIR HFA) 108 (90 Base) MCG/ACT inhaler, Inhale 1-2 puffs into the lungs every 6  (six) hours as needed for wheezing or shortness of breath., Disp: 8 g, Rfl: 6   alendronate (FOSAMAX) 70 MG tablet, Take 70 mg by mouth every Sunday., Disp: , Rfl:    allopurinol (ZYLOPRIM) 100 MG tablet, Take 1 tablet (100 mg total) by mouth daily. Start 3 days BEFORE chemotherapy starts, Disp: 30 tablet, Rfl: 0   aspirin EC 81 MG EC tablet, Take 1 tablet (81 mg total) by mouth daily at 6 (six) AM. Swallow whole., Disp: 30 tablet, Rfl: 11   atenolol (TENORMIN) 100 MG tablet, Take 100 mg by mouth daily., Disp: , Rfl:    blood glucose meter kit and supplies KIT, Dispense based on patient and insurance preference. Use up to four times daily as directed., Disp: 1 each, Rfl: 0   calcium carbonate (OSCAL) 1500 (600 Ca) MG TABS tablet, Take 600 mg of elemental calcium by mouth daily with breakfast., Disp: , Rfl:    Continuous Blood Gluc Receiver (FREESTYLE LIBRE 2 READER) DEVI, , Disp: , Rfl:    diclofenac Sodium (VOLTAREN) 1 % GEL, Apply 2 g topically daily as needed (pain)., Disp: , Rfl:    [START ON 06/22/2021] doxycycline (VIBRAMYCIN) 100 MG capsule, Take 1 capsule (100 mg total) by mouth 2 (two) times daily for 7 days.  Start taking AFTER you complete linezolid (Zyvox), Disp: 14 capsule, Rfl: 0   DULoxetine (CYMBALTA) 60 MG capsule, Take 60 mg by mouth at bedtime., Disp: , Rfl:    famciclovir (FAMVIR) 250 MG tablet, Take 1 tablet (250 mg total) by mouth 2 (two) times daily., Disp: 30 tablet, Rfl: 8   Fish Oil OIL, Take 1,500 mg by mouth daily., Disp: , Rfl:    heparin lock flush 100 UNIT/ML SOLN injection, Dispense 24 syringes with 2.76m dose (discard remainder). Flush each PICC lumen Monday, Wednesday and Friday., Disp: 120 mL, Rfl: 1   insulin glargine (LANTUS) 100 UNIT/ML Solostar Pen, Inject 18 Units into the skin daily., Disp: 15 mL, Rfl: 11   insulin lispro (HUMALOG) 100 UNIT/ML KwikPen, Inject 2-15 Units into the skin 4 (four) times daily - after meals and at bedtime. Glucose 121 - 150: 2 units,  Glucose 151 - 200: 3 units, Glucose 201 - 250: 5 units, Glucose 251 - 300: 8 units, Glucose 301 - 350: 11 units, Glucose 351 - 400: 15 units, Glucose > 400 call MD, Disp: 15 mL, Rfl: 11   Insulin Pen Needle 32G X 4 MM MISC, 1 each by Does not apply route 4 (four) times daily - after meals and at bedtime., Disp: 100 each, Rfl: 5   lidocaine-prilocaine (EMLA) cream, Apply to affected area once, Disp: 30 g, Rfl: 3   linezolid (ZYVOX) 600 MG tablet, Take 1 tablet (600 mg total) by mouth 2 (two) times daily for 14 days., Disp: 28 tablet, Rfl: 0   LORazepam (ATIVAN) 0.5 MG tablet, Take 1 tablet (0.5 mg total) by mouth every 8 (eight) hours. Take for nausea prn, Disp: 30 tablet, Rfl: 1   omeprazole (PRILOSEC) 40 MG capsule, Take 40 mg by mouth daily., Disp: , Rfl:    ondansetron (ZOFRAN) 8 MG tablet, Take 1 tablet (8 mg total) by mouth 2 (two) times daily as needed for refractory nausea / vomiting. Start on day 3 after cyclophosphamide chemotherapy., Disp: 30 tablet, Rfl: 1   potassium chloride SA (KLOR-CON M20) 20 MEQ tablet, TAKE 1 TABLET (20 MEQ TOTAL) BY MOUTH DAILY. , Disp: 30 tablet, Rfl: 5   prochlorperazine (COMPAZINE) 10 MG tablet, Take 1 tablet (10 mg total) by mouth every 6 (six) hours as needed (Nausea or vomiting)., Disp: 30 tablet, Rfl: 6   rosuvastatin (CRESTOR) 10 MG tablet, Take 10 mg by mouth daily., Disp: , Rfl:    SEREVENT DISKUS 50 MCG/ACT diskus inhaler, 1 puff 2 (two) times daily., Disp: , Rfl:    sodium chloride flush (NS) 0.9 % SOLN, Inject 10 mLs into the vein 3 (three) times a week. Flush each port of PICC Monday, Wednesday and Friday., Disp: 60 mL, Rfl: 1   tadalafil (CIALIS) 5 MG tablet, Take 5 mg by mouth daily as needed for erectile dysfunction., Disp: , Rfl:    tamsulosin (FLOMAX) 0.4 MG CAPS capsule, Take 0.4 mg by mouth every evening., Disp: , Rfl:    Zinc 50 MG TABS, Take 50 mg by mouth daily., Disp: , Rfl:  No current facility-administered medications for this  visit.  Facility-Administered Medications Ordered in Other Visits:    heparin lock flush 100 unit/mL, 250 Units, Intracatheter, Once PRN, Naudia Crosley, PRudell Cobb MD   sodium chloride flush (NS) 0.9 % injection 10 mL, 10 mL, Intracatheter, PRN, EVolanda Napoleon MD  Allergies:  Allergies  Allergen Reactions   Fexofenadine Other (See Comments)    "dries me out too much"  Hydrochlorothiazide Other (See Comments)    REACTION: cramps   Penicillins Hives   Pravastatin Sodium Other (See Comments)    REACTION: aches   Requip [Ropinirole Hcl] Other (See Comments)    Bad dreams    Past Medical History, Surgical history, Social history, and Family History were reviewed and updated.  Review of Systems: Review of Systems  Constitutional: Negative.   HENT:  Negative.    Eyes: Negative.   Respiratory: Negative.    Cardiovascular: Negative.   Gastrointestinal: Negative.   Endocrine: Negative.   Genitourinary: Negative.    Musculoskeletal: Negative.   Skin: Negative.   Neurological: Negative.   Hematological: Negative.   Psychiatric/Behavioral: Negative.     Physical Exam:  vitals were not taken for this visit.   Wt Readings from Last 3 Encounters:  06/17/21 180 lb (81.6 kg)  06/14/21 176 lb 6.4 oz (80 kg)  05/29/21 177 lb 7.5 oz (80.5 kg)  His vital signs show temperature of 98.6.  Pulse 72.  Blood pressure 103/62.  Weight is 180 pounds.  Physical Exam Vitals reviewed.  HENT:     Head: Normocephalic and atraumatic.  Eyes:     Pupils: Pupils are equal, round, and reactive to light.  Cardiovascular:     Rate and Rhythm: Normal rate and regular rhythm.     Heart sounds: Normal heart sounds.  Pulmonary:     Effort: Pulmonary effort is normal.     Breath sounds: Normal breath sounds.  Abdominal:     General: Bowel sounds are normal.     Palpations: Abdomen is soft.  Musculoskeletal:        General: No tenderness or deformity. Normal range of motion.     Cervical back: Normal  range of motion.  Lymphadenopathy:     Cervical: No cervical adenopathy.  Skin:    General: Skin is warm and dry.     Findings: No erythema or rash.  Neurological:     Mental Status: He is alert and oriented to person, place, and time.  Psychiatric:        Behavior: Behavior normal.        Thought Content: Thought content normal.        Judgment: Judgment normal.     Lab Results  Component Value Date   WBC 6.3 06/17/2021   HGB 10.9 (L) 06/17/2021   HCT 32.6 (L) 06/17/2021   MCV 85.8 06/17/2021   PLT 226 06/17/2021     Chemistry      Component Value Date/Time   NA 137 06/17/2021 0910   K 4.3 06/17/2021 0910   CL 103 06/17/2021 0910   CO2 25 06/17/2021 0910   BUN 20 06/17/2021 0910   CREATININE 0.94 06/17/2021 0910      Component Value Date/Time   CALCIUM 8.6 (L) 06/17/2021 0910   ALKPHOS 78 06/17/2021 0910   AST 20 06/17/2021 0910   ALT 25 06/17/2021 0910   BILITOT 0.7 06/17/2021 0910      Impression and Plan: Aaron Weaver is a very nice 66 year old white male.  He has diffuse large cell non-Hodgkin's lymphoma.  He has extra nodal disease.  Again, he has a fairly high IPI score.  I really think that were going to have to almost consider this his first cycle of treatment.  Again, I suspect he has had a good response with the actual first cycle in January.  I would like to give him an additional cycle after this and then do a  PET scan on him to see how everything looks.  I think it is still questionable as to whether or not we have to do any kind of CNS prophylaxis.  I think this will be dictated by his response.  I am just glad that the MRSA bacteremia resolved.  He wants to keep the PICC line in.  His wife will flush this for [place].  We will plan for his follow-up in 3 weeks for his third cycle of treatment.  Again, he really had a tough hospitalization but he showed how strong he is and made it through.   [name], MD 2/10/20231:37 PM

## 2021-06-17 NOTE — Addendum Note (Signed)
Addended by: Shelda Altes on: 06/17/2021 09:12 AM   Modules accepted: Orders

## 2021-06-17 NOTE — Patient Instructions (Signed)
[facility] [place]  Discharge Instructions: Thank you for choosing [facility] to provide your oncology and hematology care.   If you have a lab appointment with the [facility], please go directly to the [facility] and check in at the registration area.  Wear comfortable clothing and clothing appropriate for easy access to any Portacath or PICC line.   We strive to give you quality time with your provider. You may need to reschedule your appointment if you arrive late (15 or more minutes).  Arriving late affects you and other patients whose appointments are after yours.  Also, if you miss three or more appointments without notifying the office, you may be dismissed from the clinic at the providers discretion.      For prescription refill requests, have your pharmacy contact our office and allow 72 hours for refills to be completed.    Today you received the following chemotherapy and/or immunotherapy agents Rituxan, Cytoxan, Adriamycin, Vincristine      To help prevent nausea and vomiting after your treatment, we encourage you to take your nausea medication as directed.  BELOW ARE SYMPTOMS THAT SHOULD BE REPORTED IMMEDIATELY: *FEVER GREATER THAN 100.4 F (38 C) OR HIGHER *CHILLS OR SWEATING *NAUSEA AND VOMITING THAT IS NOT CONTROLLED WITH YOUR NAUSEA MEDICATION *UNUSUAL SHORTNESS OF BREATH *UNUSUAL BRUISING OR BLEEDING *URINARY PROBLEMS (pain or burning when urinating, or frequent urination) *BOWEL PROBLEMS (unusual diarrhea, constipation, pain near the anus) TENDERNESS IN MOUTH AND THROAT WITH OR WITHOUT PRESENCE OF ULCERS (sore throat, sores in mouth, or a toothache) UNUSUAL RASH, SWELLING OR PAIN  UNUSUAL VAGINAL DISCHARGE OR ITCHING   Items with * indicate a potential emergency and should be followed up as soon as possible or go to the Emergency Department if any problems should occur.  Please show the CHEMOTHERAPY ALERT CARD or IMMUNOTHERAPY  ALERT CARD at check-in to the Emergency Department and triage nurse. Should you have questions after your visit or need to cancel or reschedule your appointment, please contact [facility]  [phone] and follow the prompts.  Office hours are 8:00 a.m. to 4:30 p.m. Monday - Friday. Please note that voicemails left after 4:00 p.m. may not be returned until the following business day.  We are closed weekends and major holidays. You have access to a nurse at all times for urgent questions. Please call the main number to the clinic [phone] and follow the prompts.  For any non-urgent questions, you may also contact your provider using MyChart. We now offer e-Visits for anyone 53 and older to request care online for non-urgent symptoms. For details visit [email or web address].   Also download the MyChart app! Go to the app store, search "MyChart", open the app, select [facility], and log in with your MyChart username and password.  Due to Covid, a mask is required upon entering the hospital/clinic. If you do not have a mask, one will be given to you upon arrival. For doctor visits, patients may have 1 support person aged 73 or older with them. For treatment visits, patients cannot have anyone with them due to current Covid guidelines and our immunocompromised population.

## 2021-06-17 NOTE — Progress Notes (Signed)
1143  Excellent blood return noted every 2 ccs of administration of Adriamycin through free flowing IV.  No pain or irritation at PICC insertion site.  Patient tolerated well

## 2021-06-17 NOTE — Patient Instructions (Signed)
PICC Insertion, Care After The following information offers guidance on how to care for yourself after your procedure. Your health care provider may also give you more specific instructions. If you have problems or questions, contact your health care provider. What can I expect after the procedure? After the procedure, it is common to have mild discomfort and bruising at your insertion site. Follow these instructions at home: Bathing  Do not take baths, swim, or use a hot tub until your health care provider approves. Ask your health care provider if you may take showers. You may only be allowed to take sponge baths. Keep the bandage (dressing) dry. If it gets wet, it needs to be changed right away to help prevent infection. Activity  Rest as told by your health care provider. Ask your health care provider when you can return to your normal activities. If your PICC is near or at the bend of your elbow, avoid activity with repeated motion at the elbow. Do not lift anything that is heavier than 10 lb (4.5 kg), or the limit that you are told, until your health care provider says that it is safe. If you use a crutch, do not use it with the arm on the same side as your PICC. You may need to use a walker instead. Avoid any activity that requires great effort as told by your health care provider. General instructions If you were given a sedative during the procedure, it can affect you for several hours. Do not drive or operate machinery until your health care provider says that it is safe. Take over-the-counter and prescription medicines only as told by your health care provider. Flush the PICC as told by your health care provider. Let your health care provider know right away if the PICC is hard to flush or does not flush. Do not use force to flush the PICC. Do not use any products that contain nicotine or tobacco. These products include cigarettes, chewing tobacco, and vaping devices, such as  e-cigarettes. These can delay healing after surgery. If you need help quitting, ask your health care provider. Check your insertion site every day for signs of infection. Check for: More redness, swelling, or pain. Fluid or blood. Warmth. Pus or a bad smell. Keep all follow-up visits. This is important. You will need to have your PICC dressing changed at least once a week. Contact a health care provider if: You have pain in your arm, ear, face, or teeth. You have a fever or chills. You have more redness, swelling, or pain around your insertion site. You have fluid or blood coming from your insertion site. Your insertion site feels warm to the touch. You have pus or a bad smell coming from your insertion site. Your vein feels hard and raised like a cord under the skin around your insertion site. Your PICC dressing has gotten wet. Your PICC dressing is coming off. Tape it on until it can be changed. Get help right away if: You have problems with the PICC. These include if your PICC: Is accidentally pulled all the way out. If this happens, cover the insertion site with a gauze dressing. Do not throw the PICC away. Your health care provider will need to check it to be sure the entire catheter came out. Cannot be flushed, is hard to flush, or leaks around the insertion site when flushed. Was tugged or pulled and has partially come out. Do not push the PICC back in. Makes a flushing sound when  flushed. Appears to have a hole or tear. You feel your heart racing or skipping beats, or you have chest pain. You have swelling, redness, warmth, or pain in the arm or leg where the PICC is placed. You have a red streak going up your arm that starts under your PICC dressing. You have numbness or tingling in your fingers, hand, or arm. These symptoms may be an emergency. Get help right away. Call 911. Do not wait to see if the symptoms will go away. Do not drive yourself to the hospital. Summary After  your procedure, it is common to have mild discomfort and bruising at your insertion site. Do not lift anything that is heavier than 10 lb (4.5 kg), or the limit that you are told, until your health care provider says that it is safe. Flush the PICC as told by your health care provider. Tell your health care provider right away if the PICC is hard to flush or does not flush. Do not use force to flush the PICC. Check your insertion site every day for signs of infection. This information is not intended to replace advice given to you by your health care provider. Make sure you discuss any questions you have with your health care provider. Document Revised: 11/10/2020 Document Reviewed: 11/10/2020 Elsevier Patient Education  [place].

## 2021-06-20 ENCOUNTER — Inpatient Hospital Stay: Payer: Medicare Other

## 2021-06-20 ENCOUNTER — Other Ambulatory Visit: Payer: Self-pay

## 2021-06-20 ENCOUNTER — Other Ambulatory Visit (HOSPITAL_BASED_OUTPATIENT_CLINIC_OR_DEPARTMENT_OTHER): Payer: Self-pay

## 2021-06-20 ENCOUNTER — Encounter: Payer: Self-pay | Admitting: *Deleted

## 2021-06-20 DIAGNOSIS — Z5112 Encounter for antineoplastic immunotherapy: Secondary | ICD-10-CM | POA: Diagnosis not present

## 2021-06-20 DIAGNOSIS — Z5111 Encounter for antineoplastic chemotherapy: Secondary | ICD-10-CM | POA: Diagnosis not present

## 2021-06-20 DIAGNOSIS — Z5189 Encounter for other specified aftercare: Secondary | ICD-10-CM | POA: Diagnosis not present

## 2021-06-20 DIAGNOSIS — I714 Abdominal aortic aneurysm, without rupture, unspecified: Secondary | ICD-10-CM | POA: Diagnosis not present

## 2021-06-20 DIAGNOSIS — C8338 Diffuse large B-cell lymphoma, lymph nodes of multiple sites: Secondary | ICD-10-CM

## 2021-06-20 DIAGNOSIS — C8330 Diffuse large B-cell lymphoma, unspecified site: Secondary | ICD-10-CM | POA: Diagnosis not present

## 2021-06-20 DIAGNOSIS — Z87891 Personal history of nicotine dependence: Secondary | ICD-10-CM | POA: Diagnosis not present

## 2021-06-20 MED ORDER — PEGFILGRASTIM-BMEZ 6 MG/0.6ML ~~LOC~~ SOSY
6.0000 mg | PREFILLED_SYRINGE | Freq: Once | SUBCUTANEOUS | Status: AC
  Administered 2021-06-20: 6 mg via SUBCUTANEOUS
  Filled 2021-06-20: qty 0.6

## 2021-06-20 NOTE — Patient Instructions (Signed)

## 2021-06-20 NOTE — Progress Notes (Signed)
Called pharmacy to follow up on flushing supplies. Patient has already picked up saline and heparin flushes should be in today.   Spoke to the infusion nurses and they will provide patient's wife with flushing education when he comes in today for injection and PICC line care. She is a Camera operator with PICC line experience.   Oncology Nurse Navigator Documentation  Oncology Nurse Navigator Flowsheets 06/20/2021  Abnormal Finding Date -  Confirmed Diagnosis Date -  Diagnosis Status -  Planned Course of Treatment -  Phase of Treatment -  Chemotherapy Actual Start Date: -  Chemotherapy Expected End Date: -  Navigator Follow Up Date: 07/08/2021  Navigator Follow Up Reason: Follow-up Appointment;Chemotherapy  Production assistant, radio Encounter Type Telephone  Telephone Outgoing Call  Treatment Initiated Date -  Patient Visit Type MedOnc  Treatment Phase Active Tx  Barriers/Navigation Needs Coordination of Care;Education  Education -  Interventions Medication Assistance  Acuity Level 2-Minimal Needs (1-2 Barriers Identified)  Coordination of Care -  Education Method -  Support Groups/Services Friends and Family  Time Spent with Patient 15

## 2021-06-20 NOTE — Patient Instructions (Signed)

## 2021-06-22 ENCOUNTER — Encounter (HOSPITAL_COMMUNITY): Payer: Self-pay | Admitting: Hematology & Oncology

## 2021-06-22 ENCOUNTER — Inpatient Hospital Stay: Payer: Medicare Other

## 2021-06-22 DIAGNOSIS — R338 Other retention of urine: Secondary | ICD-10-CM | POA: Diagnosis not present

## 2021-06-23 ENCOUNTER — Other Ambulatory Visit: Payer: Self-pay | Admitting: Hematology & Oncology

## 2021-06-23 ENCOUNTER — Inpatient Hospital Stay (HOSPITAL_COMMUNITY): Admission: RE | Admit: 2021-06-23 | Payer: Medicare Other | Source: Ambulatory Visit

## 2021-06-23 ENCOUNTER — Other Ambulatory Visit: Payer: Self-pay

## 2021-06-23 ENCOUNTER — Telehealth: Payer: Self-pay | Admitting: *Deleted

## 2021-06-23 ENCOUNTER — Inpatient Hospital Stay: Payer: Medicare Other

## 2021-06-23 DIAGNOSIS — C8338 Diffuse large B-cell lymphoma, lymph nodes of multiple sites: Secondary | ICD-10-CM

## 2021-06-23 NOTE — Progress Notes (Signed)
Picc line removed, pressure dressing to areax30 minutes. Pt supine x30 minutes.

## 2021-06-23 NOTE — Progress Notes (Signed)
Patient in today for PICC line evaluation. States his wife went to flush it this morning and the red line flushed but doesn't give blood return and the purple line doesn't flush at all. Also states on Monday 2/13, the purple line was not flushing at all, no blood return and the red line was working with no issues. Patient declines any tenderness, redness or swelling.  Denies any other symptoms today. Purple lumen gives resistance, unable to get blood return. Red lumen flushes with no issues but does not give blood return. Discussed with MD. Would like pt to go to IR for evaluation and removal if needed, and patient to get scheduled for PORT placement. Pt aware and declines any other questions or concerns at this time.

## 2021-06-23 NOTE — Telephone Encounter (Signed)
Message received from patient's wife at 71 and at [phone], both stating that PICC line flushes well with NS, but that they are not able to get a blood return.  Call placed back to patient at [phone] and pt states that he is pulling up to the office now to have PICC line assessed. Infusion room and scheduling notified.

## 2021-06-23 NOTE — Patient Instructions (Signed)
PICC line d/c.  Report to ER if excessive bleeding.  Leaqve pressure dsg in place for 24 hours.

## 2021-06-24 ENCOUNTER — Inpatient Hospital Stay: Payer: Medicare Other

## 2021-06-24 LAB — SURGICAL PATHOLOGY

## 2021-06-27 ENCOUNTER — Inpatient Hospital Stay: Payer: Medicare Other

## 2021-06-28 ENCOUNTER — Emergency Department (HOSPITAL_BASED_OUTPATIENT_CLINIC_OR_DEPARTMENT_OTHER): Payer: Medicare Other

## 2021-06-28 ENCOUNTER — Observation Stay (HOSPITAL_COMMUNITY): Payer: Medicare Other

## 2021-06-28 ENCOUNTER — Other Ambulatory Visit: Payer: Self-pay

## 2021-06-28 ENCOUNTER — Encounter (HOSPITAL_BASED_OUTPATIENT_CLINIC_OR_DEPARTMENT_OTHER): Payer: Self-pay

## 2021-06-28 ENCOUNTER — Observation Stay (HOSPITAL_BASED_OUTPATIENT_CLINIC_OR_DEPARTMENT_OTHER)
Admission: EM | Admit: 2021-06-28 | Discharge: 2021-06-30 | Disposition: A | Discharge status: Home / Self Care | Payer: Medicare Other | Attending: Internal Medicine | Admitting: Internal Medicine

## 2021-06-28 DIAGNOSIS — J449 Chronic obstructive pulmonary disease, unspecified: Secondary | ICD-10-CM | POA: Diagnosis present

## 2021-06-28 DIAGNOSIS — Z8572 Personal history of non-Hodgkin lymphomas: Secondary | ICD-10-CM | POA: Insufficient documentation

## 2021-06-28 DIAGNOSIS — I10 Essential (primary) hypertension: Secondary | ICD-10-CM | POA: Diagnosis not present

## 2021-06-28 DIAGNOSIS — Z7982 Long term (current) use of aspirin: Secondary | ICD-10-CM | POA: Insufficient documentation

## 2021-06-28 DIAGNOSIS — C8338 Diffuse large B-cell lymphoma, lymph nodes of multiple sites: Secondary | ICD-10-CM | POA: Diagnosis present

## 2021-06-28 DIAGNOSIS — S2242XA Multiple fractures of ribs, left side, initial encounter for closed fracture: Secondary | ICD-10-CM | POA: Diagnosis not present

## 2021-06-28 DIAGNOSIS — R55 Syncope and collapse: Secondary | ICD-10-CM | POA: Diagnosis present

## 2021-06-28 DIAGNOSIS — D61818 Other pancytopenia: Secondary | ICD-10-CM | POA: Diagnosis not present

## 2021-06-28 DIAGNOSIS — S299XXA Unspecified injury of thorax, initial encounter: Secondary | ICD-10-CM | POA: Diagnosis not present

## 2021-06-28 DIAGNOSIS — Z794 Long term (current) use of insulin: Secondary | ICD-10-CM | POA: Insufficient documentation

## 2021-06-28 DIAGNOSIS — S3991XA Unspecified injury of abdomen, initial encounter: Secondary | ICD-10-CM | POA: Diagnosis not present

## 2021-06-28 DIAGNOSIS — U071 COVID-19: Secondary | ICD-10-CM | POA: Diagnosis not present

## 2021-06-28 DIAGNOSIS — R569 Unspecified convulsions: Secondary | ICD-10-CM

## 2021-06-28 DIAGNOSIS — I480 Paroxysmal atrial fibrillation: Secondary | ICD-10-CM | POA: Diagnosis present

## 2021-06-28 DIAGNOSIS — Z87891 Personal history of nicotine dependence: Secondary | ICD-10-CM | POA: Insufficient documentation

## 2021-06-28 DIAGNOSIS — Z8616 Personal history of COVID-19: Secondary | ICD-10-CM | POA: Diagnosis not present

## 2021-06-28 DIAGNOSIS — R7881 Bacteremia: Secondary | ICD-10-CM | POA: Diagnosis present

## 2021-06-28 DIAGNOSIS — I714 Abdominal aortic aneurysm, without rupture, unspecified: Secondary | ICD-10-CM | POA: Diagnosis present

## 2021-06-28 DIAGNOSIS — W19XXXA Unspecified fall, initial encounter: Secondary | ICD-10-CM | POA: Insufficient documentation

## 2021-06-28 DIAGNOSIS — S2249XA Multiple fractures of ribs, unspecified side, initial encounter for closed fracture: Secondary | ICD-10-CM

## 2021-06-28 DIAGNOSIS — N179 Acute kidney failure, unspecified: Secondary | ICD-10-CM

## 2021-06-28 DIAGNOSIS — E119 Type 2 diabetes mellitus without complications: Secondary | ICD-10-CM | POA: Diagnosis not present

## 2021-06-28 DIAGNOSIS — R338 Other retention of urine: Secondary | ICD-10-CM | POA: Diagnosis present

## 2021-06-28 DIAGNOSIS — Z79899 Other long term (current) drug therapy: Secondary | ICD-10-CM | POA: Insufficient documentation

## 2021-06-28 DIAGNOSIS — B9562 Methicillin resistant Staphylococcus aureus infection as the cause of diseases classified elsewhere: Secondary | ICD-10-CM | POA: Diagnosis present

## 2021-06-28 DIAGNOSIS — E871 Hypo-osmolality and hyponatremia: Secondary | ICD-10-CM

## 2021-06-28 DIAGNOSIS — F32A Depression, unspecified: Secondary | ICD-10-CM | POA: Diagnosis present

## 2021-06-28 HISTORY — DX: Syncope and collapse: R55

## 2021-06-28 HISTORY — DX: Multiple fractures of ribs, unspecified side, initial encounter for closed fracture: S22.49XA

## 2021-06-28 LAB — LACTIC ACID, PLASMA
Lactic Acid, Venous: 4.3 mmol/L (ref 0.5–1.9)
Lactic Acid, Venous: 3 mmol/L (ref 0.5–1.9)

## 2021-06-28 LAB — RESP PANEL BY RT-PCR (FLU A&B, COVID) ARPGX2
Influenza A by PCR: NEGATIVE
Influenza B by PCR: NEGATIVE
SARS Coronavirus 2 by RT PCR: POSITIVE — AB

## 2021-06-28 LAB — URINALYSIS, MICROSCOPIC (REFLEX)

## 2021-06-28 LAB — CBC WITH DIFFERENTIAL/PLATELET
Abs Immature Granulocytes: 0.06 10*3/uL (ref 0.00–0.07)
Basophils Absolute: 0.1 10*3/uL (ref 0.0–0.1)
Basophils Relative: 2 %
Eosinophils Absolute: 0 10*3/uL (ref 0.0–0.5)
Eosinophils Relative: 1 %
HCT: 27.4 % — ABNORMAL LOW (ref 39.0–52.0)
Hemoglobin: 9.5 g/dL — ABNORMAL LOW (ref 13.0–17.0)
Immature Granulocytes: 3 %
Lymphocytes Relative: 12 %
Lymphs Abs: 0.3 10*3/uL — ABNORMAL LOW (ref 0.7–4.0)
MCH: 28 pg (ref 26.0–34.0)
MCHC: 34.7 g/dL (ref 30.0–36.0)
MCV: 80.8 fL (ref 80.0–100.0)
Monocytes Absolute: 0.3 10*3/uL (ref 0.1–1.0)
Monocytes Relative: 16 %
Neutro Abs: 1.4 10*3/uL — ABNORMAL LOW (ref 1.7–7.7)
Neutrophils Relative %: 66 %
Platelets: 53 10*3/uL — ABNORMAL LOW (ref 150–400)
RBC: 3.39 MIL/uL — ABNORMAL LOW (ref 4.22–5.81)
RDW: 14.2 % (ref 11.5–15.5)
Smear Review: DECREASED
WBC Morphology: INCREASED
WBC: 2.1 10*3/uL — ABNORMAL LOW (ref 4.0–10.5)
nRBC: 2.4 % — ABNORMAL HIGH (ref 0.0–0.2)

## 2021-06-28 LAB — CBC
HCT: 27.4 % — ABNORMAL LOW (ref 39.0–52.0)
Hemoglobin: 9.6 g/dL — ABNORMAL LOW (ref 13.0–17.0)
MCH: 28.2 pg (ref 26.0–34.0)
MCHC: 35 g/dL (ref 30.0–36.0)
MCV: 80.6 fL (ref 80.0–100.0)
Platelets: 65 10*3/uL — ABNORMAL LOW (ref 150–400)
RBC: 3.4 MIL/uL — ABNORMAL LOW (ref 4.22–5.81)
RDW: 14.2 % (ref 11.5–15.5)
WBC: 2.2 10*3/uL — ABNORMAL LOW (ref 4.0–10.5)
nRBC: 1.8 % — ABNORMAL HIGH (ref 0.0–0.2)

## 2021-06-28 LAB — URINALYSIS, ROUTINE W REFLEX MICROSCOPIC
Bilirubin Urine: NEGATIVE
Glucose, UA: >=500 mg/dL — AB
Hgb urine dipstick: NEGATIVE
Ketones, ur: NEGATIVE mg/dL
Leukocytes,Ua: NEGATIVE
Nitrite: NEGATIVE
Protein, ur: NEGATIVE mg/dL
Specific Gravity, Urine: 1.01 (ref 1.005–1.030)
pH: 5 (ref 5.0–8.0)

## 2021-06-28 LAB — BASIC METABOLIC PANEL
Anion gap: 10 (ref 5–15)
BUN: 21 mg/dL (ref 8–23)
CO2: 23 mmol/L (ref 22–32)
Calcium: 9.1 mg/dL (ref 8.9–10.3)
Chloride: 97 mmol/L — ABNORMAL LOW (ref 98–111)
Creatinine, Ser: 1.05 mg/dL (ref 0.61–1.24)
GFR, Estimated: >60 mL/min (ref >60)
Glucose, Bld: 371 mg/dL — ABNORMAL HIGH (ref 70–99)
Potassium: 4.8 mmol/L (ref 3.5–5.1)
Sodium: 130 mmol/L — ABNORMAL LOW (ref 135–145)

## 2021-06-28 LAB — TROPONIN I (HIGH SENSITIVITY): Troponin I (High Sensitivity): 7 ng/L (ref <18)

## 2021-06-28 LAB — HEPATIC FUNCTION PANEL
ALT: 29 U/L (ref 0–44)
AST: 23 U/L (ref 15–41)
Albumin: 3.4 g/dL — ABNORMAL LOW (ref 3.5–5.0)
Alkaline Phosphatase: 68 U/L (ref 38–126)
Bilirubin, Direct: 0.3 mg/dL — ABNORMAL HIGH (ref 0.0–0.2)
Indirect Bilirubin: 1 mg/dL — ABNORMAL HIGH (ref 0.3–0.9)
Total Bilirubin: 1.3 mg/dL — ABNORMAL HIGH (ref 0.3–1.2)
Total Protein: 7.1 g/dL (ref 6.5–8.1)

## 2021-06-28 LAB — GLUCOSE, CAPILLARY
Glucose-Capillary: 281 mg/dL — ABNORMAL HIGH (ref 70–99)
Glucose-Capillary: 328 mg/dL — ABNORMAL HIGH (ref 70–99)

## 2021-06-28 LAB — TSH: TSH: 1.774 u[IU]/mL (ref 0.350–4.500)

## 2021-06-28 LAB — OCCULT BLOOD X 1 CARD TO LAB, STOOL: Fecal Occult Bld: NEGATIVE

## 2021-06-28 LAB — CK: Total CK: 65 U/L (ref 49–397)

## 2021-06-28 LAB — MAGNESIUM: Magnesium: 1.6 mg/dL — ABNORMAL LOW (ref 1.7–2.4)

## 2021-06-28 LAB — CBG MONITORING, ED: Glucose-Capillary: 338 mg/dL — ABNORMAL HIGH (ref 70–99)

## 2021-06-28 MED ORDER — ATENOLOL 25 MG PO TABS
50.0000 mg | ORAL_TABLET | Freq: Every day | ORAL | Status: DC
  Administered 2021-06-29 – 2021-06-30 (×2): 50 mg via ORAL
  Filled 2021-06-28 (×2): qty 2

## 2021-06-28 MED ORDER — ARFORMOTEROL TARTRATE 15 MCG/2ML IN NEBU
15.0000 ug | INHALATION_SOLUTION | Freq: Two times a day (BID) | RESPIRATORY_TRACT | Status: DC
  Administered 2021-06-29 – 2021-06-30 (×3): 15 ug via RESPIRATORY_TRACT
  Filled 2021-06-28 (×3): qty 2

## 2021-06-28 MED ORDER — SODIUM CHLORIDE 0.9% FLUSH
3.0000 mL | Freq: Two times a day (BID) | INTRAVENOUS | Status: DC
  Administered 2021-06-29 – 2021-06-30 (×3): 3 mL via INTRAVENOUS

## 2021-06-28 MED ORDER — SODIUM CHLORIDE 0.9 % IV BOLUS
1000.0000 mL | Freq: Once | INTRAVENOUS | Status: AC
  Administered 2021-06-28: 1000 mL via INTRAVENOUS

## 2021-06-28 MED ORDER — MAGNESIUM OXIDE -MG SUPPLEMENT 400 (240 MG) MG PO TABS
400.0000 mg | ORAL_TABLET | Freq: Once | ORAL | Status: AC
  Administered 2021-06-28: 400 mg via ORAL
  Filled 2021-06-28: qty 1

## 2021-06-28 MED ORDER — POTASSIUM CHLORIDE CRYS ER 20 MEQ PO TBCR
20.0000 meq | EXTENDED_RELEASE_TABLET | Freq: Every day | ORAL | Status: DC
  Administered 2021-06-29 – 2021-06-30 (×2): 20 meq via ORAL
  Filled 2021-06-28 (×2): qty 1

## 2021-06-28 MED ORDER — HYDROMORPHONE HCL 1 MG/ML IJ SOLN
0.5000 mg | Freq: Once | INTRAMUSCULAR | Status: AC
  Administered 2021-06-28: 0.5 mg via INTRAVENOUS
  Filled 2021-06-28: qty 1

## 2021-06-28 MED ORDER — INSULIN ASPART 100 UNIT/ML IJ SOLN
0.0000 [IU] | Freq: Every day | INTRAMUSCULAR | Status: DC
  Administered 2021-06-28: 4 [IU] via SUBCUTANEOUS
  Administered 2021-06-29: 3 [IU] via SUBCUTANEOUS

## 2021-06-28 MED ORDER — LIDOCAINE 5 % EX PTCH
1.0000 | MEDICATED_PATCH | CUTANEOUS | Status: DC
  Administered 2021-06-28: 1 via TRANSDERMAL
  Filled 2021-06-28 (×2): qty 1

## 2021-06-28 MED ORDER — MORPHINE SULFATE (PF) 2 MG/ML IV SOLN: 1.0000 mg | INTRAVENOUS | Status: DC | PRN

## 2021-06-28 MED ORDER — SODIUM CHLORIDE 0.9 % IV SOLN: INTRAVENOUS | Status: DC

## 2021-06-28 MED ORDER — ACETAMINOPHEN 325 MG PO TABS
650.0000 mg | ORAL_TABLET | Freq: Four times a day (QID) | ORAL | Status: DC | PRN
Start: 2021-06-28 — End: 2021-06-30

## 2021-06-28 MED ORDER — IOHEXOL 300 MG/ML  SOLN
100.0000 mL | Freq: Once | INTRAMUSCULAR | Status: AC | PRN
  Administered 2021-06-28: 100 mL via INTRAVENOUS

## 2021-06-28 MED ORDER — OXYCODONE HCL 5 MG PO TABS
5.0000 mg | ORAL_TABLET | ORAL | Status: DC | PRN
  Administered 2021-06-28 – 2021-06-30 (×9): 5 mg via ORAL
  Filled 2021-06-28 (×9): qty 1

## 2021-06-28 MED ORDER — GADOBUTROL 1 MMOL/ML IV SOLN
8.0000 mL | Freq: Once | INTRAVENOUS | Status: AC | PRN
  Administered 2021-06-28: 8 mL via INTRAVENOUS

## 2021-06-28 MED ORDER — INSULIN GLARGINE-YFGN 100 UNIT/ML ~~LOC~~ SOLN
20.0000 [IU] | Freq: Every day | SUBCUTANEOUS | Status: DC
  Administered 2021-06-28: 20 [IU] via SUBCUTANEOUS
  Filled 2021-06-28 (×3): qty 0.2

## 2021-06-28 MED ORDER — TAMSULOSIN HCL 0.4 MG PO CAPS
0.4000 mg | ORAL_CAPSULE | Freq: Every evening | ORAL | Status: DC
  Administered 2021-06-28 – 2021-06-29 (×2): 0.4 mg via ORAL
  Filled 2021-06-28 (×2): qty 1

## 2021-06-28 MED ORDER — ALLOPURINOL 100 MG PO TABS
100.0000 mg | ORAL_TABLET | Freq: Every day | ORAL | Status: DC
Start: 2021-06-29 — End: 2021-06-30
  Administered 2021-06-29 – 2021-06-30 (×2): 100 mg via ORAL
  Filled 2021-06-28 (×2): qty 1

## 2021-06-28 MED ORDER — INSULIN GLARGINE 100 UNIT/ML SOLOSTAR PEN: 20.0000 [IU] | PEN_INJECTOR | Freq: Every day | SUBCUTANEOUS | Status: DC

## 2021-06-28 MED ORDER — DULOXETINE HCL 60 MG PO CPEP
60.0000 mg | ORAL_CAPSULE | Freq: Every day | ORAL | Status: DC
Start: 2021-06-28 — End: 2021-06-30
  Administered 2021-06-28 – 2021-06-29 (×2): 60 mg via ORAL
  Filled 2021-06-28 (×2): qty 1

## 2021-06-28 MED ORDER — DOXYCYCLINE HYCLATE 100 MG PO TABS
100.0000 mg | ORAL_TABLET | Freq: Two times a day (BID) | ORAL | Status: AC
  Administered 2021-06-28 – 2021-06-30 (×4): 100 mg via ORAL
  Filled 2021-06-28 (×4): qty 1

## 2021-06-28 MED ORDER — PANTOPRAZOLE SODIUM 40 MG PO TBEC
80.0000 mg | DELAYED_RELEASE_TABLET | Freq: Every day | ORAL | Status: DC
  Administered 2021-06-28 – 2021-06-30 (×3): 80 mg via ORAL
  Filled 2021-06-28 (×3): qty 2

## 2021-06-28 MED ORDER — INSULIN ASPART 100 UNIT/ML IJ SOLN
0.0000 [IU] | Freq: Three times a day (TID) | INTRAMUSCULAR | Status: DC
  Administered 2021-06-28: 5 [IU] via SUBCUTANEOUS
  Administered 2021-06-29: 3 [IU] via SUBCUTANEOUS
  Administered 2021-06-29 – 2021-06-30 (×3): 5 [IU] via SUBCUTANEOUS

## 2021-06-28 MED ORDER — ACETAMINOPHEN 650 MG RE SUPP: 650.0000 mg | Freq: Four times a day (QID) | RECTAL | Status: DC | PRN

## 2021-06-28 MED ORDER — TADALAFIL 5 MG PO TABS: 5.0000 mg | ORAL_TABLET | Freq: Every morning | ORAL | Status: DC

## 2021-06-28 MED ORDER — CHLORHEXIDINE GLUCONATE CLOTH 2 % EX PADS
6.0000 | MEDICATED_PAD | Freq: Every day | CUTANEOUS | Status: DC
  Administered 2021-06-29 – 2021-06-30 (×2): 6 via TOPICAL

## 2021-06-28 MED ORDER — ROSUVASTATIN CALCIUM 5 MG PO TABS
10.0000 mg | ORAL_TABLET | Freq: Every day | ORAL | Status: DC
Start: 2021-06-29 — End: 2021-06-30
  Administered 2021-06-29 – 2021-06-30 (×2): 10 mg via ORAL
  Filled 2021-06-28 (×2): qty 2

## 2021-06-28 MED ORDER — ASPIRIN EC 81 MG PO TBEC
81.0000 mg | DELAYED_RELEASE_TABLET | Freq: Every day | ORAL | Status: DC
  Administered 2021-06-29 – 2021-06-30 (×2): 81 mg via ORAL
  Filled 2021-06-28 (×2): qty 1

## 2021-06-28 MED ORDER — FAMCICLOVIR 500 MG PO TABS
250.0000 mg | ORAL_TABLET | Freq: Two times a day (BID) | ORAL | Status: DC
Start: 2021-06-28 — End: 2021-06-30
  Administered 2021-06-28 – 2021-06-30 (×4): 250 mg via ORAL
  Filled 2021-06-28 (×5): qty 0.5

## 2021-06-28 NOTE — H&P (Signed)
History and Physical    Patient: Aaron Weaver KXF:[id number] DOB: April 03, 1956 DOA: 06/28/2021 DOS: the patient was seen and examined on 06/28/2021 PCP: [name], MD  Patient coming from:  [facility]  - lives with his wife    Chief Complaint: syncope and fall   HPI: Aaron EYERMAN Sr. is a 66 y.o. 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  He has a foley catheter in from last hospitalization and woke up this AM around 3:40 and sat up to go to the bathroom.  He took about 5 steps and fell on his left side onto a small gate. His wife states he started to make a strange grunting sound and when she tried to pick up his legs he started to shake really hard for about 30 seconds. She kep yelling at him and he said, "Im okay." Denies any tongue biting, has foley catheter. He got back in bed, drank a boost and went back to sleep. He denies any prodromal symptoms including dizziness, light headedness, chest pain or palpitations.  He woke up this AM and was dizzy so they came to ED. He has no hx of seizures.   He denies any fever/chills, no headaches, no chest pain/palpitations, shortness of breath or cough, no abdominal pain, no N/V/D, has foley in place and no leg swelling.   He had foley placed at last hospitalization for urinary retention. He was seen at urology last Wednesday and was unable to urinate so they had to put the foley back in.   ER Course:  vitals: afebrile, bp: 108/76, HR: 71, RR: 17, oxygen: 96%RA Pertinent labs: wbc: 2.1, hgb: 9.5, platelets: 53, glucose: 371, lactic acid: 4.3>3.0, fecal occult negative, BC obtained, UA clear.  Rib xray: left eighth, ninth and tenth rib fractures, no pneumothorax CT head: no acute findings CT  chest/abdo/pelvis: no acute findings, rib findings per above.  In ED: bolused 2L.   Review of Systems: As mentioned in the history of present illness. All other systems reviewed and are negative. 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 ([place]) 05/04/2021   Dysrhythmia    GERD (gastroesophageal reflux disease)    Impacted cerumen    Impotence of organic origin    Lumbago    Lung nodule    Other and unspecified hyperlipidemia    Routine general medical examination at a health care facility    Skipped heart beats    Tobacco use disorder    Type II or unspecified type diabetes mellitus without mention of complication, not stated as uncontrolled    type II   Unspecified essential hypertension    Past Surgical History:  Procedure Laterality Date   ABDOMINAL AORTIC ENDOVASCULAR STENT GRAFT Bilateral 07/12/2020   Procedure: ABDOMINAL AORTIC ENDOVASCULAR STENT GRAFT;  Surgeon: [name], MD;  Location: [place];  Service: Vascular;  Laterality: Bilateral;  AXILLARY LYMPH NODE BIOPSY Right 04/27/2021   Procedure: AXILLARY EXCISIONAL LYMPH NODE BIOPSY;  Surgeon: Dwan Bolt, MD;  Location: [place];  Service: General;  Laterality: Right;   BACK SURGERY     fracture   CARPECTOMY Right 09/11/2019   Procedure: PROXIMAL ROW CARPECTOMY; RADIAL STYLOIDECTOMY; POSTERIOR INTEROSSIUS NERVE RESECTION;  Surgeon: Daryll Brod, MD;  Location: [place];  Service: Orthopedics;  Laterality: Right;  AXILLARY BLOCK   COLONOSCOPY     INGUINAL HERNIA REPAIR     right   IR FLUORO GUIDE CV LINE RIGHT  06/16/2021   IR US GUIDE VASC ACCESS LEFT  06/16/2021   POLYPECTOMY      PORT-A-CATH REMOVAL N/A 06/01/2021   Procedure: REMOVAL PORT-A-CATH;  Surgeon: Armandina Gemma, MD;  Location: [facility];  Service: General;  Laterality: N/A;   PORTACATH PLACEMENT Right 05/13/2021   Procedure: INSERTION PORT-A-CATH;  Surgeon: Dwan Bolt, MD;  Location: [facility];  Service: General;  Laterality: Right;   TEE WITHOUT CARDIOVERSION N/A 06/06/2021   Procedure: TRANSESOPHAGEAL ECHOCARDIOGRAM (TEE);  Surgeon: Lelon Perla, MD;  Location: [facility];  Service: Cardiovascular;  Laterality: N/A;   TONSILLECTOMY     Social History:  reports that he quit smoking about 3 years ago. His smoking use included cigarettes. He started smoking about 56 years ago. He has a 26.50 pack-year smoking history. He has never used smokeless tobacco. He reports current alcohol use. He reports that he does not use drugs.  Allergies  Allergen Reactions   Fexofenadine Other (See Comments)    "dries me out too much"   Hydrochlorothiazide Other (See Comments)    Cramps   Penicillins Hives   Pravastatin Sodium Other (See Comments)    Aches   Requip [Ropinirole Hcl] Other (See Comments)    Bad dreams    Family History  Problem Relation Age of Onset   Colon cancer Mother 14   Hypertension Father    Coronary artery disease Other        male<60 and male<50  1st degree relative   Colon polyps Brother    Esophageal cancer Neg Hx    Rectal cancer Neg Hx    Stomach cancer Neg Hx     Prior to Admission medications   Medication Sig Start Date End Date Taking? Authorizing Provider  acetaminophen (TYLENOL) 325 MG tablet Take 650 mg by mouth as needed for mild pain, moderate pain or headache.    [provider]  albuterol (PROAIR HFA) 108 (90 Base) MCG/ACT inhaler Inhale 1-2 puffs into the lungs every 6 (six) hours as needed for wheezing or shortness of breath. 10/15/20   Collene Gobble, MD  alendronate (FOSAMAX) 70 MG tablet Take 70 mg by mouth every Sunday. 02/12/20   [provider]   allopurinol (ZYLOPRIM) 100 MG tablet Take 1 tablet (100 mg total) by mouth daily. Start 3 days BEFORE chemotherapy starts 06/09/21   [name], MD  aspirin EC 81 MG EC tablet Take 1 tablet (81 mg total) by mouth daily at 6 (six) AM. Swallow whole. 07/14/20   Ulyses Amor, PA-C  atenolol (TENORMIN) 100 MG tablet Take 100 mg by mouth daily. 09/02/20   [provider]  blood glucose meter kit and supplies KIT Dispense based on patient and insurance preference. Use up to four times daily as directed. 06/08/21   Dwyane Dee, MD  calcium carbonate (OSCAL) 1500 (600 Ca) MG TABS tablet Take 600 mg of  elemental calcium by mouth daily with breakfast.    [provider]  Continuous Blood Gluc Receiver (FREESTYLE LIBRE 2 READER) DEVI  06/16/21   [provider]  diclofenac Sodium (VOLTAREN) 1 % GEL Apply 2 g topically daily as needed (pain).    [provider]  doxycycline (VIBRAMYCIN) 100 MG capsule Take 1 capsule (100 mg total) by mouth 2 (two) times daily for 7 days. Start taking AFTER you complete linezolid (Zyvox) 06/22/21 06/29/21  Dwyane Dee, MD  DULoxetine (CYMBALTA) 60 MG capsule Take 60 mg by mouth at bedtime. 04/01/18   [provider]  famciclovir (FAMVIR) 250 MG tablet Take 1 tablet (250 mg total) by mouth 2 (two) times daily. 06/09/21   [name], MD  Fish Oil OIL Take 1,500 mg by mouth daily.    [provider]  heparin lock flush 100 UNIT/ML SOLN injection Dispense 24 syringes with 2.26m dose (discard remainder). Flush each PICC lumen Monday, Wednesday and Friday. 06/17/21   EVolanda Napoleon MD  insulin glargine (LANTUS) 100 UNIT/ML Solostar Pen Inject 18 Units into the skin daily. 06/08/21   GDwyane Dee MD  insulin lispro (HUMALOG) 100 UNIT/ML KwikPen Inject 2-15 Units into the skin 4 (four) times daily - after meals and at bedtime. Glucose 121 - 150: 2 units, Glucose 151 - 200: 3 units, Glucose 201 - 250: 5 units, Glucose 251 -  300: 8 units, Glucose 301 - 350: 11 units, Glucose 351 - 400: 15 units, Glucose > 400 call MD 06/08/21   GDwyane Dee MD  Insulin Pen Needle 32G X 4 MM MISC 1 each by Does not apply route 4 (four) times daily - after meals and at bedtime. 06/08/21   GDwyane Dee MD  lidocaine-prilocaine (EMLA) cream Apply to affected area once 05/16/21   EVolanda Napoleon MD  LORazepam (ATIVAN) 0.5 MG tablet Take 1 tablet (0.5 mg total) by mouth every 8 (eight) hours. Take for nausea prn 05/17/21   EVolanda Napoleon MD  omeprazole (PRILOSEC) 40 MG capsule Take 40 mg by mouth daily.    [provider]  ondansetron (ZOFRAN) 8 MG tablet Take 1 tablet (8 mg total) by mouth 2 (two) times daily as needed for refractory nausea / vomiting. Start on day 3 after cyclophosphamide chemotherapy. 05/17/21   EVolanda Napoleon MD  potassium chloride SA (KLOR-CON M20) 20 MEQ tablet TAKE 1 TABLET (20 MEQ TOTAL) BY MOUTH DAILY.  02/13/13   Plotnikov, AEvie Lacks MD  predniSONE (DELTASONE) 20 MG tablet Take 3 tablets (60 mg total) by mouth as directed. For 5 days with treatment 06/17/21   EVolanda Napoleon MD  prochlorperazine (COMPAZINE) 10 MG tablet Take 1 tablet (10 mg total) by mouth every 6 (six) hours as needed (Nausea or vomiting). 05/17/21   EVolanda Napoleon MD  rosuvastatin (CRESTOR) 10 MG tablet Take 10 mg by mouth daily. 03/16/20   [provider]  SEREVENT DISKUS 50 MCG/ACT diskus inhaler 1 puff 2 (two) times daily. 05/25/21   [provider]  sodium chloride flush (NS) 0.9 % SOLN Inject 10 mLs into the vein 3 (three) times a week. Flush each port of PICC Monday, Wednesday and Friday. 06/17/21   EVolanda Napoleon MD  tadalafil (CIALIS) 5 MG tablet Take 5 mg by mouth daily as needed for erectile dysfunction.    [provider]  tamsulosin (FLOMAX) 0.4 MG CAPS capsule Take 0.4 mg by mouth every evening.    [provider]  Zinc 50 MG TABS Take 50 mg by mouth daily.    [provider]     Physical Exam: Vitals:   06/28/21 1415 06/28/21 1528 06/28/21 1631 06/28/21 2019  BP: 120/74 (!) 141/91 130/76 121/70  Pulse: 65 69 60 74  Resp: '18 18 16 20  ' Temp:  97.6 F (36.4 C) 97.7 F (36.5 C) 97.8 F (36.6 C)  TempSrc:  Oral Oral Oral  SpO2: 96% 98% 100%    General:  Appears calm and comfortable and is in NAD Eyes:  PERRL, EOMI, normal lids, iris ENT:  grossly normal hearing, lips & tongue, mmm; not teeth  Neck:  no LAD, masses or thyromegaly; no carotid bruits Cardiovascular:  RRR, no m/r/g. No LE edema.  Respiratory:   CTA bilaterally with no wheezes/rales/rhonchi.  Normal respiratory effort. Abdomen:  soft, NT, ND, NABS Back:   normal alignment, no CVAT Skin:  no rash or induration seen on limited exam Musculoskeletal:  grossly normal tone BUE/BLE, good ROM, no bony abnormality TTP over left lateral rib cage. Small abrasion  Lower extremity:  No LE edema.  Limited foot exam with no ulcerations.  2+ distal pulses. Psychiatric:  grossly normal mood and affect, speech fluent and appropriate, AOx3 Neurologic:  CN 2-12 grossly intact, moves all extremities in coordinated fashion, sensation intact. Gait deferred.    Radiological Exams on Admission: Independently reviewed - see discussion in A/P where applicable  DG Ribs Unilateral W/Chest Left  Result Date: 06/28/2021 CLINICAL DATA:  [place] EXAM: LEFT RIBS AND CHEST - 3+ VIEW COMPARISON:  05/29/2021 FINDINGS: Minimally displaced fracture, lateral aspect left eighth rib. Displaced fracture, anterolateral left ninth rib. Minimally distracted fracture, left tenth rib. No pneumothorax or effusion evident. Surgical clips right axilla. Abdominal aortic stent partially visualized. Previous cement augmentation of T12. IMPRESSION: 1. Left eighth, ninth, tenth rib fractures without effusion or pneumothorax. Electronically Signed   By: Lucrezia Europe M.D.   On: 06/28/2021 10:20   CT Head Wo Contrast  Result Date: 06/28/2021 CLINICAL  DATA:  Loss of consciousness this morning on the way to the bathroom, atrial fibrillation, has been on Holter monitor for 2 weeks. History COPD, type II diabetes mellitus, hypertension EXAM: CT HEAD WITHOUT CONTRAST TECHNIQUE: Contiguous axial images were obtained from the base of the skull through the vertex without intravenous contrast. RADIATION DOSE REDUCTION: This exam was performed according to the departmental dose-optimization program which includes automated exposure control, adjustment of the mA and/or kV according to patient size and/or use of iterative reconstruction technique. COMPARISON:  05/29/2021 FINDINGS: Brain: Generalized atrophy. Normal ventricular morphology. No midline shift or mass effect. Normal appearance of brain parenchyma. No intracranial hemorrhage, mass lesion, or evidence of acute infarction. No extra-axial fluid collections. Vascular: Atherosclerotic calcifications of internal carotid arteries at skull base Skull: Intact Sinuses/Orbits: Clear Other: N/A IMPRESSION: Generalized atrophy. No acute intracranial abnormalities. Electronically Signed   By: Lavonia Dana M.D.   On: 06/28/2021 12:39   CT CHEST ABDOMEN PELVIS W CONTRAST  Result Date: 06/28/2021 CLINICAL DATA:  Loss of consciousness and fall with trauma to chest and left rib pain with abrasions. History of lymphoma. EXAM: CT CHEST, ABDOMEN, AND PELVIS WITH CONTRAST TECHNIQUE: Multidetector CT imaging of the chest, abdomen and pelvis was performed following the standard protocol during bolus administration of intravenous contrast. RADIATION DOSE REDUCTION: This exam was performed according to the departmental dose-optimization program which includes automated exposure control, adjustment of the mA and/or kV according to patient size  and/or use of iterative reconstruction technique. CONTRAST:  117m OMNIPAQUE IOHEXOL 300 MG/ML  SOLN COMPARISON:  CT of the abdomen and pelvis on 05/30/2021, CTA of the abdomen and pelvis on  02/25/2021, PET scan on 04/15/2021. FINDINGS: CT CHEST FINDINGS Cardiovascular: Normal caliber thoracic aorta without significant atherosclerosis. Normal caliber central pulmonary arteries. Evidence of coronary atherosclerosis with calcified plaque present. The heart size is normal. No pericardial fluid identified. Mediastinum/Nodes: No enlarged mediastinal, hilar, or axillary lymph nodes. Thyroid gland, trachea, and esophagus demonstrate no significant findings. No evidence of mediastinal hemorrhage. Lungs/Pleura: Stable chronic lung disease with primarily peripheral interstitial thickening and potential early fibrotic disease. There is no evidence of pulmonary edema, consolidation, pneumothorax, nodule or pleural fluid. Musculoskeletal: There are acute nondisplaced fractures involving the left ninth, tenth, eleventh and twelfth ribs. No significant associated chest wall hemorrhage. Old healed fracture of the posterior left ninth rib. 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 CT ABDOMEN PELVIS FINDINGS Hepatobiliary: Stable hepatic steatosis. Normal gallbladder. No biliary ductal dilatation or masses. Pancreas: Unremarkable. No pancreatic ductal dilatation or surrounding inflammatory changes. Spleen: Normal in size without focal abnormality. Adrenals/Urinary Tract: Stable soft tissue fullness in the sinus fat of the left kidney extending towards the collecting system but not associated with significant hydronephrosis or delay in contrast excretion on the delayed images. Stable 8 mm left adrenal nodule is likely consistent with an adenoma. The bladder is decompressed by a Foley catheter. Stomach/Bowel: Bowel shows no evidence of obstruction, ileus, inflammation or lesion. No free intraperitoneal air. 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 Stable excluded aneurysm sac size of approximately 3.3 x  4.2 cm without evidence of endoleak on delayed images. Endograft limbs remain normally patent. Diminished prominence of left common iliac lymph node measuring 1.5 cm compared to 2.0 cm previously. Reproductive: Prostate is unremarkable. Other: Stable small left inguinal hernia containing fat. No ascites or abnormal fluid collections. Musculoskeletal: Stable chronic compression fracture of T12 with prior vertebral augmentation. IMPRESSION: 1. Acute nondisplaced fractures of the left ninth, tenth, eleventh and twelfth ribs without associated pneumothorax, hemothorax or significant chest wall contusion/hematoma. 2. Stable old fractures of the left ninth and right eighth and ninth ribs. 3. Stable soft tissue fullness in the sinus fat of the left kidney without associated significant hydronephrosis. 4. Stable appearance of the aorta status post EVAR without evidence of endoleak. 5. Decrease in size of a left common iliac lymph node since prior CT on 05/30/2021. Next number stable small left inguinal hernia containing fat. Electronically Signed   By: GAletta EdouardM.D.   On: 06/28/2021 12:49    EKG: Independently reviewed.  NSR with rate 69; nonspecific ST changes with no evidence of acute ischemia   Labs on Admission: I have personally reviewed the available labs and imaging studies at the time of the admission.  Pertinent labs:   wbc: 2.1,  hgb: 9.5,  platelets: 53,  glucose: 371,  lactic acid: 4.3>3.0,  fecal occult negative,  BC obtained,  UA clear.  Covid positive (had incidental covid 05/29/21 and treated with remdesivir, asymptomatic at this time)    Assessment and Plan: Syncope and collapse- (present on admission) -66year old with history of lymphoma currently on chemotherapy presenting with witnessed episode of syncope and collapse with some shaking -observation to telemetry -CT head with no acute findings, troponin ordered, ekg with no ST findings.  -does  have hx of PAF in setting of  illness, currently wearing a ziopatch  -history seems more consistent with orthostasis vs. Autonomic dysregulation with hx of chemo.  -orthostatics POSITIVE  -just had echo done 1/23: EF of 59 %. The left ventricle has normal function. The left ventricle has no regional wall motion abnormalities. Left ventricular diastolic parameters are consistent with Grade I diastolic dysfunction -adjust BP medication -no signs of infection/no fever. 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 Marin Olp and is on chemotherapy. First cycle started 1/13, last cycle: 2/10  and he received G-CSF on 1/6 -Continue Famvir for shingles prophylaxis -Bone marrow biopsy 2/1 negative for lymphoma  -Dr. Marin Olp has been consulted   Acute urinary retention- (present on admission) [place] etiology during last hospital stay Followed up with urology this past week with trial foley removal on 2/15 and had to return for foley placement Continue foley care here with f/u urology   Pancytopenia [facility])- (present on admission) Currently in chemo with last infusion 2/13.  ANC: [id number], mild neutropenia  No fever or signs of infection. monitor fever curve Oncology consulted BC ordered, lactic acid elevated but expected in setting of prolonged syncope  Paroxysmal atrial fibrillation ([place])- (present on admission) In setting of acute illness in 05/2021 Has seen cardiology and zio patch currently being done  CHA2Ds2-Vasc is 4 No AC at this time, continue telemetry and ASA   DM2 (diabetes mellitus, type 2) ([place]) a1c in 05/2021: 9.1 Continue long acting insulin and  SSI accuchecks per protocol   Depression- (present on admission) Continue cymbalta   COPD (chronic obstructive pulmonary disease) ([place])- (present on admission) No signs of acute excerbation Continue serevent and albuterol prn   AAA (abdominal aortic aneurysm) without rupture- (present on admission) Hx of repair.   Essential hypertension- (present on admission) Has been well controlled Decreasing his tenormin to 54m with orthostasis   MRSA bacteremia- (present on admission) TEE completed 1/30 with no vegetations seen - discharged with 2 weeks of Zyvox followed by 1 week of doxycycline per ID recommendations -continue doxycycline for 2 more days   History of COVID-19 + covid in January with 3 day course of remdesivir during hospitalization False + from previous infection, will continue precautions with immunocompromised status   Hyponatremia On last admit in January, urine studies done:  Serum osm borderline low at 279 with high urine Na of 85 suggesting SIADH Stable, continue to follow  -PPI and cymbalta could also be contributing     Advance Care Planning:   Code Status: Full Code   Consults: oncology: Dr. EMarin Olp neurology: Dr. LCheral Marker  DVT Prophylaxis: SCDs  Family Communication: wife and son at bedside: Aaron Weaver and Aaron Weaver   Severity of Illness: The appropriate patient status for this patient is OBSERVATION. Observation status is judged to be reasonable and necessary in order to provide the required intensity of service to ensure the patient's safety. The patient's presenting symptoms, physical exam findings, and initial radiographic and laboratory data in the context of their medical condition is felt to place them at decreased risk for further clinical deterioration. Furthermore, it is anticipated that the patient will  be medically stable for discharge from the hospital within 2 midnights of admission.   Author: Orma Flaming, MD 06/28/2021 8:38 PM  For on call review  [email or web address].

## 2021-06-28 NOTE — Assessment & Plan Note (Signed)
a1c in 05/2021: 9.1 Continue long acting insulin and SSI accuchecks per protocol

## 2021-06-28 NOTE — Consult Note (Signed)
Neurology Consultation  Reason for Consult: Syncope with Convulsions Referring Physician: Orma Flaming  CC: Syncopal episode   History is obtained from: patient, wife and son  HPI: Aaron KETCHUM Sr. is a 66 y.o. 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 Patient was reportedly incoherent for a minute but then was fine. Patient went back to bed and came to ED in morning. Patient had woken up to empty is urine catheter bag. Per EDP note, patient did not have any prodromal symptoms nor did he feel lightheaded, dizzy, short of breath, chest pain, vision change, headache, or numbness/tingling in extremities leading up to the episode. Patient did report some dizziness afterwards though. Patient was transferred to [facility] for further workup.  Patient is not on anticoagulation and is wearing a Holter monitor to rule out afib. Patient never had seizure in past. Patient reports having multiple of these types of syncopal episodes in the past including more recently last week where he suddenly passed out due to dehydration, but none of the previous fainting spells was accompanied by a convulsion. Patient reports he feels like he just "suddenly falls asleep" and then wakes up. Patient admits he is regularly dehydrated and this has been a problem for years.   Neuro consulted for whether episode was seizure vs vasovagal syncope vs metastatic process in brain given his non-Hodgkin's lymphoma.  ED Course:  Vitals: afebrile, bp: 108/76, HR: 71, RR: 17, O2 sat: 96% RA.  Labs: CBC shows pancytopenia, CBG 371, lactic acid: 4.3 > 3.0, fecal occult negative, Blood culture obtained, UA clear.  DG Ribs XR: Left 8-10th rib fractures, no pneumothorax CT Head; no acute  findings CT Chest/ab/pelvis: no acute findings other than rib findings  ROS: A complete ROS was performed and is negative except as noted in the HPI.   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 ([place]) 05/04/2021   Dysrhythmia    GERD (gastroesophageal reflux disease)    Impacted cerumen    Impotence of organic origin    Lumbago    Lung nodule    Other and unspecified hyperlipidemia    Routine general medical examination at a health care facility    Skipped heart beats    Tobacco use disorder    Type II or unspecified type diabetes mellitus without mention of complication, not stated as uncontrolled    type II   Unspecified essential hypertension      Family History  Problem Relation Age of Onset   Colon cancer Mother 37   Hypertension Father    Coronary artery disease Other        male<60 and male<50  1st degree relative   Colon polyps Brother    Esophageal cancer Neg Hx    Rectal cancer Neg Hx    Stomach cancer Neg Hx      Social History:  reports that he quit smoking about 3 years ago. His smoking use included cigarettes. He started smoking about 56 years ago. He has a 26.50 pack-year smoking history. He has never used smokeless tobacco. He reports current alcohol use. He reports that he does not use drugs.  Medications  Current Facility-Administered Medications:    insulin aspart (novoLOG) injection 0-9 Units, 0-9 Units, Subcutaneous, TID WC, Orma Flaming, MD   Exam: Current vital signs: BP 130/76 (BP Location: Right Arm)    Pulse 60    Temp 97.7 F (36.5 C) (Oral)    Resp 16    SpO2 100%  Vital signs in last 24 hours: Temp:  [97.5 F (36.4 C)-98.1 F (36.7  C)] 97.7 F (36.5 C) (02/21 1631) Pulse Rate:  [60-71] 60 (02/21 1631) Resp:  [12-24] 16 (02/21 1631) BP: (103-141)/(62-91) 130/76 (02/21 1631) SpO2:  [93 %-100 %] 100 % (02/21 1631)  GENERAL: Awake, alert, in no acute distress Psych: Affect appropriate for situation, patient is calm and cooperative with examination Head: Normocephalic and atraumatic, without obvious abnormality EENT: Normal conjunctivae, dry mucous membranes, no OP obstruction LUNGS: Normal respiratory effort. Non-labored breathing on room air CV: Regular rate and rhythm on telemetry ABDOMEN: Soft, non-tender, non-distended Extremities: warm, well perfused, without obvious deformity  NEURO:  Mental Status: Awake, alert, and oriented to person, place, time, and situation. He/She is able to provide a clear and coherent history of present illness. Speech/Language: speech is appropriate volume and prosody.   Naming, repetition, fluency, and comprehension intact without aphasia  No neglect is noted Cranial Nerves:  II: PERRL. Visual fields full.  III, IV, VI: EOMI. Lid elevation symmetric and full.  V: Sensation is intact to light touch and symmetrical to face. Blinks to threat. Moves jaw back and forth.  VII: Face is symmetric resting and smiling. Able to puff cheeks and raise eyebrows.  VIII: Hearing intact to voice IX, X: Palate elevation is symmetric. Phonation normal.  XI: Normal sternocleidomastoid and trapezius muscle strength XII: Tongue protrudes midline without fasciculations.   Motor: 5/5 in BUE. 4/5 in BLE feels pain when moving LLE to resistance due to rib fracture.  Tone is normal. Bulk is normal.  Sensation: Intact to light touch bilaterally in all four extremities. No extinction to DSS present.  Coordination: FTN intact bilaterally. HKS intact bilaterally. No pronator drift. Alternating hand movements.  DTRs: 2+ throughout.  Gait: Deferred  Labs I have reviewed labs in epic and the results  pertinent to this consultation are:  UA: >500 glucose, but otherwise normal  CBC    Component Value Date/Time   WBC 2.2 (L) 06/28/2021 0915   WBC 2.1 (L) 06/28/2021 0915   RBC 3.40 (L) 06/28/2021 0915   RBC 3.39 (L) 06/28/2021 0915   HGB 9.6 (L) 06/28/2021 0915   HGB 9.5 (L) 06/28/2021 0915   HGB 10.9 (L) 06/17/2021 0910   HCT 27.4 (L) 06/28/2021 0915   HCT 27.4 (L) 06/28/2021 0915   PLT 65 (L) 06/28/2021 0915   PLT 53 (L) 06/28/2021 0915   PLT 226 06/17/2021 0910   MCV 80.6 06/28/2021 0915   MCV 80.8 06/28/2021 0915   MCH 28.2 06/28/2021 0915   MCH 28.0 06/28/2021 0915   MCHC 35.0 06/28/2021 0915   MCHC 34.7 06/28/2021 0915   RDW 14.2 06/28/2021 0915   RDW 14.2 06/28/2021 0915   LYMPHSABS 0.3 (L) 06/28/2021 0915   MONOABS 0.3 06/28/2021 0915   EOSABS 0.0 06/28/2021 0915   BASOSABS 0.1  06/28/2021 0915    CMP     Component Value Date/Time   NA 130 (L) 06/28/2021 0915   K 4.8 06/28/2021 0915   CL 97 (L) 06/28/2021 0915   CO2 23 06/28/2021 0915   GLUCOSE 371 (H) 06/28/2021 0915   BUN 21 06/28/2021 0915   CREATININE 1.05 06/28/2021 0915   CREATININE 0.94 06/17/2021 0910   CALCIUM 9.1 06/28/2021 0915   PROT 7.1 06/28/2021 0915   ALBUMIN 3.4 (L) 06/28/2021 0915   AST 23 06/28/2021 0915   AST 20 06/17/2021 0910   ALT 29 06/28/2021 0915   ALT 25 06/17/2021 0910   ALKPHOS 68 06/28/2021 0915   BILITOT 1.3 (H) 06/28/2021 0915   BILITOT 0.7 06/17/2021 0910   GFRNONAA >60 06/28/2021 0915   GFRNONAA >60 06/17/2021 0910   GFRAA >60 09/08/2019 1200    Lipid Panel     Component Value Date/Time   CHOL 117 11/23/2011 0756   TRIG 85.0 11/23/2011 0756   HDL 35.10 (L) 11/23/2011 0756   CHOLHDL 3 11/23/2011 0756   VLDL 17.0 11/23/2011 0756   LDLCALC 65 11/23/2011 0756   LDLDIRECT 73.2 07/19/2010 0757     Imaging I have reviewed the images obtained:  CT-scan of the brain: Generalized atrophy, no acute intracranial abnormalities  Assessment: [name] is a  66 y.o. 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 This syncopal episode is very similar to previous ones due to dehydration with the exception of 30 seconds of convulsion, which is new. Patient regularly dehydrated. No acute neurological deficits noted at this time.  Impression: - DDx: Syncope 2/2 orthostatic hypotension vs vasovagal syncope vs seizure vs metastatic process. Given patient is regularly dehydrated, has had similar episodes from dehydration, and this episode occurred at 3 AM, this syncopal episode is highly likely due to dehydration and orthostatic hypotension. - Most likely etiology for his presentation is a Syncopal Convulsion, which is a non-epileptic seizure induced by sudden decrease in brain perfusion due to the syncopal episode, leading to brief convulsion, and usually without a significant postictal period.   Recommendations: -Orthostatic Vitals -EEG for seizure activity -MRI brain with and without contrast  France Ravens, MD PGY1 Resident  I have seen and examined the patient. I have formulated the assessment and recommendations. 66 year old male with PMHx as above who presents with convulsion after collapsing from what was most likely a syncopal spell. Exam is nonfocal. Recommendations as above.  Electronically signed: Dr. Kerney Elbe

## 2021-06-28 NOTE — Assessment & Plan Note (Addendum)
In setting of acute illness in 05/2021 Has seen cardiology and zio patch currently being done  CHA2Ds2-Vasc is 4 No AC at this time, continue telemetry and ASA

## 2021-06-28 NOTE — Assessment & Plan Note (Signed)
Currently in chemo with last infusion 2/13.  ANC: [id number], mild neutropenia  No fever or signs of infection. monitor fever curve Oncology consulted BC ordered, lactic acid elevated but expected in setting of prolonged syncope

## 2021-06-28 NOTE — ED Notes (Signed)
Attempted to call report, rn not available.

## 2021-06-28 NOTE — Assessment & Plan Note (Signed)
No signs of acute excerbation Continue serevent and albuterol prn

## 2021-06-28 NOTE — ED Provider Notes (Signed)
[place] EMERGENCY DEPARTMENT Provider Note   CSN: [id number] Arrival date & time: 06/28/21  0849     History  Chief Complaint  Patient presents with   Loss of Consciousness    Aaron TOMASETTI Sr. is a 66 y.o. male.  HPI  Patient with medical history including Lymphoma currently on chemotherapy, A-fib,COPD, diabetes, presents with chief complaint of syncopal episode.  Patient states that he woke this morning around 3 AM, went to drain his catheter and collapsed.  He is not sure why he collapsed, he states prior to collapsing did not have any chest pain shortness of breath no lightheaded or dizziness no headaches or change in vision no numbness or ting arms or legs.  States that he woke up and he felt just fine and went back to bed.  He states when he woke up he is having some left lower rib pain, without any other complaints.  Denies shortness of breath, pleuritic chest pain, lightheadedness dizziness nausea vomiting paresthesia weakness upper lower extremities.  He states that he is not on anticoag, he states he was wearing a Holter monitor to rule out for A-fib.  He is actively getting chemotherapy.  He has no history of seizure disorders, has never had this in the past.  Wife is at bedside and validates that he did get up at 3 AM, and fell,  when she found him he was on the ground having full body shaking, lasted approximately 1 minutes then resolved, he was altered for about another 30-60 seconds and then was back to normal.  She does note that he has not been eating or drinking very much but this is normal for him.  Home Medications Prior to Admission medications   Medication Sig Start Date End Date Taking? Authorizing Provider  acetaminophen (TYLENOL) 325 MG tablet Take 650 mg by mouth as needed for mild pain, moderate pain or headache.    [provider]  albuterol (PROAIR HFA) 108 (90 Base) MCG/ACT inhaler Inhale 1-2 puffs into the lungs every 6 (six) hours as  needed for wheezing or shortness of breath. 10/15/20   Collene Gobble, MD  alendronate (FOSAMAX) 70 MG tablet Take 70 mg by mouth every Sunday. 02/12/20   [provider]  allopurinol (ZYLOPRIM) 100 MG tablet Take 1 tablet (100 mg total) by mouth daily. Start 3 days BEFORE chemotherapy starts 06/09/21   [name], MD  aspirin EC 81 MG EC tablet Take 1 tablet (81 mg total) by mouth daily at 6 (six) AM. Swallow whole. 07/14/20   Ulyses Amor, PA-C  atenolol (TENORMIN) 100 MG tablet Take 100 mg by mouth daily. 09/02/20   [provider]  blood glucose meter kit and supplies KIT Dispense based on patient and insurance preference. Use up to four times daily as directed. 06/08/21   Dwyane Dee, MD  calcium carbonate (OSCAL) 1500 (600 Ca) MG TABS tablet Take 600 mg of elemental calcium by mouth daily with breakfast.    [provider]  Continuous Blood Gluc Receiver (FREESTYLE LIBRE 2 READER) DEVI  06/16/21   [provider]  diclofenac Sodium (VOLTAREN) 1 % GEL Apply 2 g topically daily as needed (pain).    [provider]  doxycycline (VIBRAMYCIN) 100 MG capsule Take 1 capsule (100 mg total) by mouth 2 (two) times daily for 7 days. Start taking AFTER you complete linezolid (Zyvox) 06/22/21 06/29/21  Dwyane Dee, MD  DULoxetine (CYMBALTA) 60 MG capsule Take 60  mg by mouth at bedtime. 04/01/18   [provider]  famciclovir (FAMVIR) 250 MG tablet Take 1 tablet (250 mg total) by mouth 2 (two) times daily. 06/09/21   [name], MD  Fish Oil OIL Take 1,500 mg by mouth daily.    [provider]  heparin lock flush 100 UNIT/ML SOLN injection Dispense 24 syringes with 2.75m dose (discard remainder). Flush each PICC lumen Monday, Wednesday and Friday. 06/17/21   EVolanda Napoleon MD  insulin glargine (LANTUS) 100 UNIT/ML Solostar Pen Inject 18 Units into the skin daily. 06/08/21   GDwyane Dee MD  insulin lispro (HUMALOG) 100 UNIT/ML KwikPen  Inject 2-15 Units into the skin 4 (four) times daily - after meals and at bedtime. Glucose 121 - 150: 2 units, Glucose 151 - 200: 3 units, Glucose 201 - 250: 5 units, Glucose 251 - 300: 8 units, Glucose 301 - 350: 11 units, Glucose 351 - 400: 15 units, Glucose > 400 call MD 06/08/21   GDwyane Dee MD  Insulin Pen Needle 32G X 4 MM MISC 1 each by Does not apply route 4 (four) times daily - after meals and at bedtime. 06/08/21   GDwyane Dee MD  lidocaine-prilocaine (EMLA) cream Apply to affected area once 05/16/21   EVolanda Napoleon MD  LORazepam (ATIVAN) 0.5 MG tablet Take 1 tablet (0.5 mg total) by mouth every 8 (eight) hours. Take for nausea prn 05/17/21   EVolanda Napoleon MD  omeprazole (PRILOSEC) 40 MG capsule Take 40 mg by mouth daily.    [provider]  ondansetron (ZOFRAN) 8 MG tablet Take 1 tablet (8 mg total) by mouth 2 (two) times daily as needed for refractory nausea / vomiting. Start on day 3 after cyclophosphamide chemotherapy. 05/17/21   EVolanda Napoleon MD  potassium chloride SA (KLOR-CON M20) 20 MEQ tablet TAKE 1 TABLET (20 MEQ TOTAL) BY MOUTH DAILY.  02/13/13   Plotnikov, AEvie Lacks MD  predniSONE (DELTASONE) 20 MG tablet Take 3 tablets (60 mg total) by mouth as directed. For 5 days with treatment 06/17/21   EVolanda Napoleon MD  prochlorperazine (COMPAZINE) 10 MG tablet Take 1 tablet (10 mg total) by mouth every 6 (six) hours as needed (Nausea or vomiting). 05/17/21   EVolanda Napoleon MD  rosuvastatin (CRESTOR) 10 MG tablet Take 10 mg by mouth daily. 03/16/20   [provider]  SEREVENT DISKUS 50 MCG/ACT diskus inhaler 1 puff 2 (two) times daily. 05/25/21   [provider]  sodium chloride flush (NS) 0.9 % SOLN Inject 10 mLs into the vein 3 (three) times a week. Flush each port of PICC Monday, Wednesday and Friday. 06/17/21   EVolanda Napoleon MD  tadalafil (CIALIS) 5 MG tablet Take 5 mg by mouth daily as needed for erectile dysfunction.    [provider]  tamsulosin (FLOMAX) 0.4 MG CAPS capsule Take 0.4 mg by mouth every evening.    [provider]  Zinc 50 MG TABS Take 50 mg by mouth daily.    [provider]      Allergies    Fexofenadine, Hydrochlorothiazide, Penicillins, Pravastatin sodium, and Requip [ropinirole hcl]    Review of Systems   Review of Systems  Constitutional:  Negative for chills and fever.  Respiratory:  Negative for shortness of breath.   Cardiovascular:  Positive for chest pain. Negative for palpitations.  Gastrointestinal:  Negative for abdominal pain.  Neurological:  Positive for syncope. Negative for headaches.  Physical Exam Updated Vital Signs BP (!) 141/91 (BP Location: Left Arm)    Pulse 69    Temp 97.6 F (36.4 C) (Oral)    Resp 18    SpO2 98%  Physical Exam Vitals and nursing note reviewed.  Constitutional:      General: He is not in acute distress.    Appearance: He is not ill-appearing.     Comments: Patient is chronically ill-appearing, no acute distress  HENT:     Head: Normocephalic and atraumatic.     Comments: No deformity to head, no raccoon eyes or battle sign noted.    Nose: No congestion.     Mouth/Throat:     Mouth: Mucous membranes are moist.     Pharynx: Oropharynx is clear. No oropharyngeal exudate or posterior oropharyngeal erythema.     Comments: No trismus no torticollis, patient has no oral trauma, he does not have any teeth, no tongue abrasions. Eyes:     Extraocular Movements: Extraocular movements intact.     Conjunctiva/sclera: Conjunctivae normal.     Pupils: Pupils are equal, round, and reactive to light.  Cardiovascular:     Rate and Rhythm: Normal rate and regular rhythm.     Pulses: Normal pulses.     Heart sounds: No murmur heard.   No friction rub. No gallop.  Pulmonary:     Effort: No respiratory distress.     Breath sounds: No wheezing, rhonchi or rales.     Comments: Patient's chest was palpated he had noted crepitus on the mid  axillary of the 8 through 10 ribs, no flail chest sign lung sounds were clear bilaterally. Chest:     Chest wall: Tenderness present.  Abdominal:     Palpations: Abdomen is soft.     Tenderness: There is abdominal tenderness. There is no right CVA tenderness or left CVA tenderness.     Comments: Abdomen nondistended, normal bowel sounds, dull to percussion, had noticed left upper quadrant tenderness, there is no guarding, rebound times, peritoneal sign negative Murphy sign McBurney point.  Skin:    General: Skin is warm and dry.     Comments: Small abrasion noted over the mid actually aspect of his left ribs.  Neurological:     Mental Status: He is alert.     GCS: GCS eye subscore is 4. GCS verbal subscore is 5. GCS motor subscore is 6.     Cranial Nerves: Cranial nerves 2-12 are intact. No cranial nerve deficit.     Sensory: Sensation is intact.     Motor: No weakness.     Coordination: Romberg sign negative.     Comments: Cranial nerves II through XII grossly intact, no difficultity with word finding, able follow two-step commands, no weakness present.  will defer ambulating the patient as when I set him up he states he felt very dizzy and sat him back down.  Psychiatric:        Mood and Affect: Mood normal.    ED Results / Procedures / Treatments   Labs (all labs ordered are listed, but only abnormal results are displayed) Labs Reviewed  BASIC METABOLIC PANEL - Abnormal; Notable for the following components:      Result Value   Sodium 130 (*)    Chloride 97 (*)    Glucose, Bld 371 (*)    All other components within normal limits  CBC - Abnormal; Notable for the following components:   WBC 2.2 (*)    RBC 3.40 (*)  Hemoglobin 9.6 (*)    HCT 27.4 (*)    Platelets 65 (*)    nRBC 1.8 (*)    All other components within normal limits  URINALYSIS, ROUTINE W REFLEX MICROSCOPIC - Abnormal; Notable for the following components:   Glucose, UA >=500 (*)    All other components  within normal limits  LACTIC ACID, PLASMA - Abnormal; Notable for the following components:   Lactic Acid, Venous 4.3 (*)    All other components within normal limits  LACTIC ACID, PLASMA - Abnormal; Notable for the following components:   Lactic Acid, Venous 3.0 (*)    All other components within normal limits  HEPATIC FUNCTION PANEL - Abnormal; Notable for the following components:   Albumin 3.4 (*)    Total Bilirubin 1.3 (*)    Bilirubin, Direct 0.3 (*)    Indirect Bilirubin 1.0 (*)    All other components within normal limits  CBC WITH DIFFERENTIAL/PLATELET - Abnormal; Notable for the following components:   WBC 2.1 (*)    RBC 3.39 (*)    Hemoglobin 9.5 (*)    HCT 27.4 (*)    Platelets 53 (*)    nRBC 2.4 (*)    Neutro Abs 1.4 (*)    Lymphs Abs 0.3 (*)    All other components within normal limits  MAGNESIUM - Abnormal; Notable for the following components:   Magnesium 1.6 (*)    All other components within normal limits  URINALYSIS, MICROSCOPIC (REFLEX) - Abnormal; Notable for the following components:   Bacteria, UA RARE (*)    All other components within normal limits  CBG MONITORING, ED - Abnormal; Notable for the following components:   Glucose-Capillary 338 (*)    All other components within normal limits  CULTURE, BLOOD (ROUTINE X 2)  CULTURE, BLOOD (ROUTINE X 2)  RESP PANEL BY RT-PCR (FLU A&B, COVID) ARPGX2  OCCULT BLOOD X 1 CARD TO LAB, STOOL  CK    EKG EKG Interpretation  Date/Time:  Tuesday June 28 2021 08:58:30 EST Ventricular Rate:  69 PR Interval:  160 QRS Duration: 87 QT Interval:  408 QTC Calculation: 438 R Axis:   69 Text Interpretation: Sinus rhythm Confirmed by Fredia Sorrow [phone]) on 06/28/2021 9:18:48 AM  Radiology DG Ribs Unilateral W/Chest Left  Result Date: 06/28/2021 CLINICAL DATA:  [place] EXAM: LEFT RIBS AND CHEST - 3+ VIEW COMPARISON:  05/29/2021 FINDINGS: Minimally displaced fracture, lateral aspect left eighth rib. Displaced  fracture, anterolateral left ninth rib. Minimally distracted fracture, left tenth rib. No pneumothorax or effusion evident. Surgical clips right axilla. Abdominal aortic stent partially visualized. Previous cement augmentation of T12. IMPRESSION: 1. Left eighth, ninth, tenth rib fractures without effusion or pneumothorax. Electronically Signed   By: Lucrezia Europe M.D.   On: 06/28/2021 10:20   CT Head Wo Contrast  Result Date: 06/28/2021 CLINICAL DATA:  Loss of consciousness this morning on the way to the bathroom, atrial fibrillation, has been on Holter monitor for 2 weeks. History COPD, type II diabetes mellitus, hypertension EXAM: CT HEAD WITHOUT CONTRAST TECHNIQUE: Contiguous axial images were obtained from the base of the skull through the vertex without intravenous contrast. RADIATION DOSE REDUCTION: This exam was performed according to the departmental dose-optimization program which includes automated exposure control, adjustment of the mA and/or kV according to patient size and/or use of iterative reconstruction technique. COMPARISON:  05/29/2021 FINDINGS: Brain: Generalized atrophy. Normal ventricular morphology. No midline shift or mass effect. Normal appearance of brain parenchyma. No intracranial hemorrhage,  mass lesion, or evidence of acute infarction. No extra-axial fluid collections. Vascular: Atherosclerotic calcifications of internal carotid arteries at skull base Skull: Intact Sinuses/Orbits: Clear Other: N/A IMPRESSION: Generalized atrophy. No acute intracranial abnormalities. Electronically Signed   By: Lavonia Dana M.D.   On: 06/28/2021 12:39   CT CHEST ABDOMEN PELVIS W CONTRAST  Result Date: 06/28/2021 CLINICAL DATA:  Loss of consciousness and fall with trauma to chest and left rib pain with abrasions. History of lymphoma. EXAM: CT CHEST, ABDOMEN, AND PELVIS WITH CONTRAST TECHNIQUE: Multidetector CT imaging of the chest, abdomen and pelvis was performed following the standard protocol  during bolus administration of intravenous contrast. RADIATION DOSE REDUCTION: This exam was performed according to the departmental dose-optimization program which includes automated exposure control, adjustment of the mA and/or kV according to patient size and/or use of iterative reconstruction technique. CONTRAST:  150m OMNIPAQUE IOHEXOL 300 MG/ML  SOLN COMPARISON:  CT of the abdomen and pelvis on 05/30/2021, CTA of the abdomen and pelvis on 02/25/2021, PET scan on 04/15/2021. FINDINGS: CT CHEST FINDINGS Cardiovascular: Normal caliber thoracic aorta without significant atherosclerosis. Normal caliber central pulmonary arteries. Evidence of coronary atherosclerosis with calcified plaque present. The heart size is normal. No pericardial fluid identified. Mediastinum/Nodes: No enlarged mediastinal, hilar, or axillary lymph nodes. Thyroid gland, trachea, and esophagus demonstrate no significant findings. No evidence of mediastinal hemorrhage. Lungs/Pleura: Stable chronic lung disease with primarily peripheral interstitial thickening and potential early fibrotic disease. There is no evidence of pulmonary edema, consolidation, pneumothorax, nodule or pleural fluid. Musculoskeletal: There are acute nondisplaced fractures involving the left ninth, tenth, eleventh and twelfth ribs. No significant associated chest wall hemorrhage. Old healed fracture of the posterior left ninth rib. 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 CT ABDOMEN PELVIS FINDINGS Hepatobiliary: Stable hepatic steatosis. Normal gallbladder. No biliary ductal dilatation or masses. Pancreas: Unremarkable. No pancreatic ductal dilatation or surrounding inflammatory changes. Spleen: Normal in size without focal abnormality. Adrenals/Urinary Tract: Stable soft tissue fullness in the sinus fat of the left kidney extending towards the collecting system but not associated  with significant hydronephrosis or delay in contrast excretion on the delayed images. Stable 8 mm left adrenal nodule is likely consistent with an adenoma. The bladder is decompressed by a Foley catheter. Stomach/Bowel: Bowel shows no evidence of obstruction, ileus, inflammation or lesion. No free intraperitoneal air. 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 Stable excluded aneurysm sac size of approximately 3.3 x 4.2 cm without evidence of endoleak on delayed images. Endograft limbs remain normally patent. Diminished prominence of left common iliac lymph node measuring 1.5 cm compared to 2.0 cm previously. Reproductive: Prostate is unremarkable. Other: Stable small left inguinal hernia containing fat. No ascites or abnormal fluid collections. Musculoskeletal: Stable chronic compression fracture of T12 with prior vertebral augmentation. IMPRESSION: 1. Acute nondisplaced fractures of the left ninth, tenth, eleventh and twelfth ribs without associated pneumothorax, hemothorax or significant chest wall contusion/hematoma. 2. Stable old fractures of the left ninth and right eighth and ninth ribs. 3. Stable soft tissue fullness in the sinus fat of the left kidney without associated significant hydronephrosis. 4. Stable appearance of the aorta status post EVAR without evidence of endoleak. 5. Decrease in size of a left common iliac lymph node since prior CT on 05/30/2021. Next number stable small left inguinal hernia containing fat. Electronically Signed   By: GAletta EdouardM.D.   On:  06/28/2021 12:49    Procedures Procedures    Medications Ordered in ED Medications  sodium chloride 0.9 % bolus 1,000 mL (0 mLs Intravenous Stopped 06/28/21 1142)  HYDROmorphone (DILAUDID) injection 0.5 mg (0.5 mg Intravenous Given 06/28/21 1023)  iohexol (OMNIPAQUE) 300 MG/ML solution 100 mL (100 mLs Intravenous Contrast Given 06/28/21 1149)  sodium chloride 0.9 % bolus 1,000 mL (0 mLs  Intravenous Stopped 06/28/21 1449)  HYDROmorphone (DILAUDID) injection 0.5 mg (0.5 mg Intravenous Given 06/28/21 1530)    ED Course/ Medical Decision Making/ A&P                           Medical Decision Making Amount and/or Complexity of Data Reviewed Labs: ordered. Radiology: ordered.  Risk Prescription drug management. Decision regarding hospitalization.   This patient presents to the ED for concern of syncopal episode, this involves an extensive number of treatment options, and is a complaint that carries with it a high risk of complications and morbidity.  The differential diagnosis includes CVA, intracranial head bleed, seizures, metabolic abnormality    Additional history obtained:  Additional history obtained from wife who is at bedside External records from outside source obtained and reviewed including N/A   Co morbidities that complicate the patient evaluation  Immunocompromise, cancer  Social Determinants of Health:  N/A    Lab Tests:  I Ordered, and personally interpreted labs.  The pertinent results include: CBC shows cytopenia, CBC 2.1, normocytic anemia hemoglobin 9.5, platelets 53, BMP shows sodium 130 chloride 97 glucose 371, hepatic panel shows albumin 3.4 T. bili 1.3 direct T. bili 0.3 indirect 1, lactic was 4, second lactic is 3, Hemoccult negative, UA unremarkable, magnesium 1.6, CK 65   Imaging Studies ordered:  I ordered imaging studies including chest x-ray, CT head, CT chest abdomen pelvis I independently visualized and interpreted imaging which showed CT head negative, chest x-ray shows rib fractures along 8 through 10.  CT on pelvis reveals fractures through 12/14/08 and 11. I agree with the radiologist interpretation   Cardiac Monitoring:  The patient was maintained on a cardiac monitor.  I personally viewed and interpreted the cardiac monitored which showed an underlying rhythm of: EKG sinus without signs of ischemia   Medicines ordered  and prescription drug management:  I ordered medication including fluids, Dilaudid for pain I have reviewed the patients home medicines and have made adjustments as needed  Reevaluation: Patient presents with left-sided rib pain and had a soft blood pressure, he was provided with fluids, and pain medications, he is reassessed states he is resting comfortably.  We will continue to monitor.  Chest x-ray reveals 3 rib fractures, he was having some left upper abdominal pain and could be from his ribs or possible intra-abdominal trauma will obtain CT chest abdomen pelvis and reassess.  will also consult with neurology as patient does have a negative head CT but I am concerned that he has a elevated lactic he might of had a seizure.  Patient had an elevated lactic, I did obtain a rectal temperature which was negative, I have lower suspicion for infection at this time as he is afebrile, nontachycardic, no leukocytosis.  I suspect elevated lactic is doses secondary due to  syncope versus dehydration versus seizures.  Will recommend admission patient agreed in this plan.  Consultations Obtained:  I requested consultation with the neurology Dr. Leonel Ramsay,  and discussed lab and imaging findings as well as pertinent plan - they recommend: Recommends  patient gets MRI with and without contrast as well as get an ECG.   Consult with Dr. Eliberto Ivory who will admit the patient.   Rule out I have low suspicion for sepsis as patient not meet sepsis criteria or SIRS criteria.  He does have elevated lactic but I doubt this is infectious as he is afebrile, nontachycardic, no leukocytosis, no obvious source infection on my exam.  I have low suspicion for intrathoracic or intra-abdominal trauma as CT imaging is negative for these findings.  I have low suspicion for CVA or intracranial head bleed there is no focal deficit present my exam, CT head is negative for acute findings.  I have low suspicion for meningitis as she has  no meningeal sign, afebrile no leukocytosis.      Dispostion and problem list  After consideration of the diagnostic results and the patients response to treatment, I feel that the patent would benefit from admission.  Syncope-unclear etiology possible vasovagal versus polypharmacy versus metastatic disease versus seizures will need further evaluation. Rib fractures-recommend symptom management will need further observation.            Final Clinical Impression(s) / ED Diagnoses Final diagnoses:  Syncope and collapse  Closed fracture of multiple ribs of left side, initial encounter    Rx / DC Orders ED Discharge Orders     None         Marcello Fennel, PA-C 06/28/21 1542    Fredia Sorrow, MD 07/07/21 [phone]

## 2021-06-28 NOTE — Assessment & Plan Note (Addendum)
unkwown etiology during last hospital stay Followed up with urology this past week with trial foley removal on 2/15 and had to return for foley placement Continue foley care here with f/u urology

## 2021-06-28 NOTE — Progress Notes (Signed)
Pt arrived on unit. Pt oriented to unit bed is locked at the lowest position and call bell is within reach

## 2021-06-28 NOTE — Progress Notes (Signed)
EEG complete - results pending 

## 2021-06-28 NOTE — Assessment & Plan Note (Addendum)
TEE completed 1/30 with no vegetations seen - discharged with 2 weeks of Zyvox followed by 1 week of doxycycline per ID recommendations -continue doxycycline for 2 more days

## 2021-06-28 NOTE — ED Notes (Signed)
Emptied leg bag for patient comfort.

## 2021-06-28 NOTE — ED Notes (Signed)
Blood culture number 2 drawn via 23 gauge butterfly from L hand.  Pt tolerated well.

## 2021-06-28 NOTE — ED Notes (Signed)
Patient transported to CT 

## 2021-06-28 NOTE — Assessment & Plan Note (Addendum)
-  66 year old with history of lymphoma currently on chemotherapy presenting with witnessed episode of syncope and collapse with some shaking -observation to telemetry -CT head with no acute findings, troponin ordered, ekg with no ST findings.  -does have hx of PAF in setting of illness, currently wearing a ziopatch  -history seems more consistent with orthostasis vs. Autonomic dysregulation with hx of chemo.  -orthostatics POSITIVE  -just had echo done 1/23: EF of 59 %. The left ventricle has normal function. The left ventricle has no regional wall motion abnormalities. Left ventricular diastolic parameters are consistent with Grade I diastolic dysfunction -adjust BP medication -no signs of infection/no fever. BC ordered with pancytopenia  -TED hose -IVF overnight  -check magnesium and CK  -neurology consulted-f/u on rec for MRI/EEG

## 2021-06-28 NOTE — ED Triage Notes (Addendum)
Pt brought in by wife. Pt had a loss of consciousness this morning on the way to [facility]. Holter monitor removed this morning after fall Pt had been wearing  for 2 weeks due to Afib. Pt not on any blood thinners.Reports pain left ribs

## 2021-06-28 NOTE — Assessment & Plan Note (Signed)
Patient follows with Dr. Marin Olp and is on chemotherapy. First cycle started 1/13, last cycle: 2/10  and he received G-CSF on 1/6 -Continue Famvir for shingles prophylaxis -Bone marrow biopsy 2/1 negative for lymphoma  -Dr. Marin Olp has been consulted

## 2021-06-28 NOTE — Progress Notes (Signed)
Plan of Care Note for accepted transfer   Patient: Aaron Weaver Sr. MRN: [id number]   DOA: 06/28/2021  Facility requesting transfer: [facility] Requesting Provider: Dr. Rogene Houston? 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  Had full body shaking x 30 seconds, was incoherent for a minutes then after a minute was fine. Went back to bed, then came to ED in AM.   No focal neuro deficits. Neurology called, Dr. Rodney Booze and recommended MRI and EEG for seizure r/o in setting of lymphoma.   Vitals stable, lab show wbc of 2.2, hgb of 9.6, platelets of 65, lactic acid of 4.3>3.0 Fecal occult negative.  Rib xray: left eighth, ninth and tenth rib fractures, no pneumothorax CT head: no acute findings CT chest/abdo/pelvis: no acute findings, rib findings per above.  In ED: bolused 2L.    Plan of care: The patient is accepted for admission to Telemetry unit, at [facility].. call neurology, consult oncology  -adding on magnesium, CK labs  -pancyotpenia recent chemo, w/u here at [facility].    Author: Orma Flaming, MD 06/28/2021  Check [email or web address] for on-call coverage.  Nursing staff, Please call [place] number on Amion as soon as patient's arrival, so appropriate admitting provider can evaluate the pt.

## 2021-06-28 NOTE — Assessment & Plan Note (Addendum)
Acute nondisplaced rib fractures of left 9-12 ribs No pneumothorax IS to bedside Pain control with oxycodone and morphine for severe pain

## 2021-06-28 NOTE — Assessment & Plan Note (Signed)
Has been well controlled Decreasing his tenormin to 50mg  with orthostasis

## 2021-06-28 NOTE — Assessment & Plan Note (Signed)
Hx of repair.

## 2021-06-28 NOTE — ED Notes (Signed)
Pt in bed, pt reports that he passed out this am, sig other at bedside, states that she is concerned that it may have been a seizure, pt oriented and awake, pt moving all extremities.  Pt has some abrasions and pain to L ribs.  Pt has increased pain with movement.

## 2021-06-28 NOTE — Assessment & Plan Note (Signed)
Continue cymbalta  

## 2021-06-28 NOTE — Assessment & Plan Note (Signed)
+   covid in January with 3 day course of remdesivir during hospitalization False + from previous infection, will continue precautions with immunocompromised status

## 2021-06-28 NOTE — Assessment & Plan Note (Addendum)
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

## 2021-06-28 NOTE — ED Notes (Signed)
Care link at bedside, report to care link, report called to [facility], RN Olivia Mackie, pt from dpt via care link

## 2021-06-29 ENCOUNTER — Inpatient Hospital Stay: Payer: Medicare Other

## 2021-06-29 DIAGNOSIS — E871 Hypo-osmolality and hyponatremia: Secondary | ICD-10-CM | POA: Diagnosis not present

## 2021-06-29 DIAGNOSIS — D61818 Other pancytopenia: Secondary | ICD-10-CM | POA: Diagnosis not present

## 2021-06-29 DIAGNOSIS — Z87891 Personal history of nicotine dependence: Secondary | ICD-10-CM | POA: Diagnosis not present

## 2021-06-29 DIAGNOSIS — R7881 Bacteremia: Secondary | ICD-10-CM | POA: Diagnosis not present

## 2021-06-29 DIAGNOSIS — J449 Chronic obstructive pulmonary disease, unspecified: Secondary | ICD-10-CM | POA: Diagnosis not present

## 2021-06-29 DIAGNOSIS — I10 Essential (primary) hypertension: Secondary | ICD-10-CM | POA: Diagnosis not present

## 2021-06-29 DIAGNOSIS — R739 Hyperglycemia, unspecified: Secondary | ICD-10-CM | POA: Diagnosis not present

## 2021-06-29 DIAGNOSIS — R55 Syncope and collapse: Secondary | ICD-10-CM | POA: Diagnosis not present

## 2021-06-29 DIAGNOSIS — S2242XA Multiple fractures of ribs, left side, initial encounter for closed fracture: Secondary | ICD-10-CM

## 2021-06-29 DIAGNOSIS — N179 Acute kidney failure, unspecified: Secondary | ICD-10-CM | POA: Diagnosis not present

## 2021-06-29 DIAGNOSIS — B9562 Methicillin resistant Staphylococcus aureus infection as the cause of diseases classified elsewhere: Secondary | ICD-10-CM | POA: Diagnosis not present

## 2021-06-29 DIAGNOSIS — C8338 Diffuse large B-cell lymphoma, lymph nodes of multiple sites: Secondary | ICD-10-CM | POA: Diagnosis not present

## 2021-06-29 DIAGNOSIS — Z515 Encounter for palliative care: Secondary | ICD-10-CM

## 2021-06-29 DIAGNOSIS — Z7189 Other specified counseling: Secondary | ICD-10-CM

## 2021-06-29 DIAGNOSIS — E860 Dehydration: Secondary | ICD-10-CM | POA: Diagnosis not present

## 2021-06-29 DIAGNOSIS — R569 Unspecified convulsions: Secondary | ICD-10-CM | POA: Diagnosis not present

## 2021-06-29 LAB — BASIC METABOLIC PANEL
Anion gap: 8 (ref 5–15)
BUN: 16 mg/dL (ref 8–23)
CO2: 23 mmol/L (ref 22–32)
Calcium: 8.4 mg/dL — ABNORMAL LOW (ref 8.9–10.3)
Chloride: 103 mmol/L (ref 98–111)
Creatinine, Ser: 1.09 mg/dL (ref 0.61–1.24)
GFR, Estimated: >60 mL/min (ref >60)
Glucose, Bld: 271 mg/dL — ABNORMAL HIGH (ref 70–99)
Potassium: 4.5 mmol/L (ref 3.5–5.1)
Sodium: 134 mmol/L — ABNORMAL LOW (ref 135–145)

## 2021-06-29 LAB — GLUCOSE, CAPILLARY
Glucose-Capillary: 254 mg/dL — ABNORMAL HIGH (ref 70–99)
Glucose-Capillary: 256 mg/dL — ABNORMAL HIGH (ref 70–99)
Glucose-Capillary: 237 mg/dL — ABNORMAL HIGH (ref 70–99)
Glucose-Capillary: 279 mg/dL — ABNORMAL HIGH (ref 70–99)
Glucose-Capillary: 286 mg/dL — ABNORMAL HIGH (ref 70–99)

## 2021-06-29 LAB — PREPARE RBC (CROSSMATCH)

## 2021-06-29 LAB — CBC
HCT: 22.9 % — ABNORMAL LOW (ref 39.0–52.0)
Hemoglobin: 8.1 g/dL — ABNORMAL LOW (ref 13.0–17.0)
MCH: 28.8 pg (ref 26.0–34.0)
MCHC: 35.4 g/dL (ref 30.0–36.0)
MCV: 81.5 fL (ref 80.0–100.0)
Platelets: 55 10*3/uL — ABNORMAL LOW (ref 150–400)
RBC: 2.81 MIL/uL — ABNORMAL LOW (ref 4.22–5.81)
RDW: 14.1 % (ref 11.5–15.5)
WBC: 2.3 10*3/uL — ABNORMAL LOW (ref 4.0–10.5)
nRBC: 2.2 % — ABNORMAL HIGH (ref 0.0–0.2)

## 2021-06-29 LAB — TROPONIN I (HIGH SENSITIVITY): Troponin I (High Sensitivity): 13 ng/L (ref <18)

## 2021-06-29 MED ORDER — MAGNESIUM SULFATE 2 GM/50ML IV SOLN
2.0000 g | Freq: Once | INTRAVENOUS | Status: AC
  Administered 2021-06-29: 2 g via INTRAVENOUS
  Filled 2021-06-29: qty 50

## 2021-06-29 MED ORDER — FUROSEMIDE 10 MG/ML IJ SOLN
20.0000 mg | Freq: Once | INTRAMUSCULAR | Status: AC
  Administered 2021-06-29: 20 mg via INTRAVENOUS
  Filled 2021-06-29 (×2): qty 4

## 2021-06-29 MED ORDER — ACETAMINOPHEN 325 MG PO TABS
650.0000 mg | ORAL_TABLET | Freq: Once | ORAL | Status: AC
  Administered 2021-06-29: 650 mg via ORAL
  Filled 2021-06-29: qty 2

## 2021-06-29 MED ORDER — INSULIN GLARGINE-YFGN 100 UNIT/ML ~~LOC~~ SOLN
24.0000 [IU] | Freq: Every day | SUBCUTANEOUS | Status: DC
  Administered 2021-06-29: 24 [IU] via SUBCUTANEOUS
  Filled 2021-06-29 (×3): qty 0.24

## 2021-06-29 MED ORDER — SODIUM CHLORIDE 0.9% IV SOLUTION: Freq: Once | INTRAVENOUS | Status: AC

## 2021-06-29 MED ORDER — FUROSEMIDE 10 MG/ML IJ SOLN
20.0000 mg | Freq: Once | INTRAMUSCULAR | Status: AC
  Administered 2021-06-29: 20 mg via INTRAVENOUS
  Filled 2021-06-29: qty 4

## 2021-06-29 MED ORDER — HYDRALAZINE HCL 25 MG PO TABS: 25.0000 mg | ORAL_TABLET | Freq: Three times a day (TID) | ORAL | Status: DC | PRN

## 2021-06-29 MED ORDER — DICLOFENAC SODIUM 1 % EX GEL
4.0000 g | Freq: Four times a day (QID) | CUTANEOUS | Status: DC
  Administered 2021-06-29 – 2021-06-30 (×4): 4 g via TOPICAL
  Filled 2021-06-29: qty 100

## 2021-06-29 MED ORDER — DIPHENHYDRAMINE HCL 25 MG PO CAPS
25.0000 mg | ORAL_CAPSULE | Freq: Once | ORAL | Status: AC
  Administered 2021-06-29: 25 mg via ORAL
  Filled 2021-06-29: qty 1

## 2021-06-29 MED ORDER — INSULIN ASPART 100 UNIT/ML IJ SOLN
3.0000 [IU] | Freq: Three times a day (TID) | INTRAMUSCULAR | Status: DC
Start: 2021-06-29 — End: 2021-06-30
  Administered 2021-06-29: 3 [IU] via SUBCUTANEOUS

## 2021-06-29 NOTE — Evaluation (Signed)
Occupational Therapy Evaluation Patient Details Name: Aaron MCBRYAR Sr. MRN: [id number] DOB: 11/19/1955 Today's Date: 06/29/2021   History of Present Illness 66 year old male presented with syncope with L 8-10 rib fractures from fall. 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 He is generally indep at baseline including driving, but reports being generally weak since his last hospital admission & discharge with a few falls (described as syncopal). Pt lives in a 1 level home, ramped entrance with his wife who can assist as needed. Upon evaluation pt was significantly limited by L rib pain and high BP. Overall he required min A for bed mobility with pillow splinting and assist for pain management. Once sitting BP was 173/151, HR 89 with two attempts (160s/151 the first time). Pt stood and took minimal side steps toward [facility] with close min guard and returned supine. BP in supine was 104/74, HR 84. RN and MD made aware. Pt will benefit from OT acutely. Anticipate good ability to d/c home without OT follow up once medically stable.      Recommendations for follow up therapy are one component of a multi-disciplinary discharge planning process, led by the attending physician.  Recommendations may be updated based on patient status, additional functional criteria and insurance authorization.   Follow Up Recommendations  No OT follow up    Assistance Recommended at Discharge Frequent or constant Supervision/Assistance  Patient can return home with the following A little help with walking and/or transfers;A little help with bathing/dressing/bathroom;Assist for transportation;Help with stairs or ramp for  entrance    Functional Status Assessment  Patient has had a recent decline in their functional status and demonstrates the ability to make significant improvements in function in a reasonable and predictable amount of time.  Equipment Recommendations  None recommended by OT       Precautions / Restrictions Precautions Precautions: Fall Precaution Comments: covid+, watch BP Restrictions Weight Bearing Restrictions: No      Mobility Bed Mobility Overal bed mobility: Needs Assistance Bed Mobility: Rolling, Sidelying to Sit, Sit to Sidelying Rolling: Supervision Sidelying to sit: Min assist     Sit to sidelying: Min assist General bed mobility comments: pillow split cues & assist for pain management    Transfers Overall transfer level: Needs assistance   Transfers: Sit to/from Stand Sit to Stand: Min guard           General transfer comment: limited to sit<>stand and minimal side steps towards HOB due to BP this session      Balance Overall balance assessment: Needs assistance Sitting-balance support: Feet supported Sitting balance-Leahy Scale: Fair     Standing balance support: No upper extremity supported Standing balance-Leahy Scale: Fair             ADL either performed or assessed with clinical judgement   ADL Overall ADL's : Needs assistance/impaired Eating/Feeding: Independent;Sitting   Grooming: Set up;Sitting   Upper Body Bathing: Minimal assistance;Sitting   Lower Body Bathing: Moderate assistance;Sit to/from stand   Upper Body Dressing : Minimal assistance;Sitting   Lower Body Dressing: Moderate assistance;Sit to/from stand   Toilet Transfer: Minimal assistance;Stand-pivot;BSC/3in1   Toileting- Water quality scientist and Hygiene: Min guard;Sitting/lateral lean  Functional mobility during ADLs: Minimal assistance General ADL Comments: limited by L rib fxs, and BP this session     Vision Baseline Vision/History: 1 Wears  glasses Ability to See in Adequate Light: 0 Adequate Patient Visual Report: No change from baseline Vision Assessment?: No apparent visual deficits            Pertinent Vitals/Pain Pain Assessment Pain Assessment: Faces Faces Pain Scale: Hurts whole lot Pain Location: L ribs Pain Descriptors / Indicators: Discomfort, Grimacing, Guarding Pain Intervention(s): Limited activity within patient's tolerance, Monitored during session     Hand Dominance Right   Extremity/Trunk Assessment Upper Extremity Assessment Upper Extremity Assessment: Generalized weakness (Limited LUE asseessment due to L rib fractures)   Lower Extremity Assessment Lower Extremity Assessment: Defer to PT evaluation   Cervical / Trunk Assessment Cervical / Trunk Assessment: Other exceptions Cervical / Trunk Exceptions: L rib fxs   Communication Communication Communication: No difficulties   Cognition Arousal/Alertness: Awake/alert Behavior During Therapy: WFL for tasks assessed/performed Overall Cognitive Status: Within Functional Limits for tasks assessed             General Comments  BP upon sitting 173/151, HR 89. BP in supine after activity 104/74, HR 80.            Home Living Family/patient expects to be discharged to:: Private residence Living Arrangements: Spouse/significant other Available Help at Discharge: Family;Available 24 hours/day Type of Home: House Home Access: Ramped entrance     Home Layout: One level     Bathroom Shower/Tub: Teacher, early years/pre: Standard     Home Equipment: None   Additional Comments: lives with wife      Prior Functioning/Environment Prior Level of Function : Independent/Modified Independent;Driving;History of Falls (last six months)             Mobility Comments: no AD, falls likely due to syncope ADLs Comments: indep, drives        OT Problem List: Decreased strength;Decreased range of motion;Decreased activity  tolerance;Impaired balance (sitting and/or standing);Pain;Decreased safety awareness;Decreased knowledge of use of DME or AE;Decreased knowledge of precautions      OT Treatment/Interventions: Self-care/ADL training;Therapeutic exercise;Balance training;Patient/family education;Therapeutic activities;DME and/or AE instruction    OT Goals(Current goals can be found in the care plan section) Acute Rehab OT Goals Patient Stated Goal: less pain OT Goal Formulation: With patient Time For Goal Achievement: 07/13/21 Potential to Achieve Goals: Good ADL Goals Pt Will Perform Grooming: with modified independence;standing Pt Will Perform Lower Body Dressing: with modified independence;sit to/from stand Pt Will Transfer to Toilet: with modified independence;ambulating Pt/caregiver will Perform Home Exercise Program: Increased strength;Both right and left upper extremity;With written HEP provided Additional ADL Goal #1: Pt will indep recall at least 3 fall prevention strategies to apply to the home setting  OT Frequency: Min 2X/week       AM-PAC OT "6 Clicks" Daily Activity     Outcome Measure Help from another person eating meals?: None Help from another person taking care of personal grooming?: A Little Help from another person toileting, which includes using toliet, bedpan, or urinal?: A Little Help from another person bathing (including washing, rinsing, drying)?: A Lot Help from another person to put on and taking off regular upper body clothing?: A Little Help from another person to put on and taking off regular lower body clothing?: A Lot 6 Click Score: 17   End of Session Nurse Communication: Mobility status  Activity Tolerance: Patient tolerated treatment well Patient  left: in bed;with call bell/phone within reach;with bed alarm set;with family/visitor present  OT Visit Diagnosis: Unsteadiness on feet (R26.81);Other abnormalities of gait and mobility (R26.89);Muscle weakness  (generalized) (M62.81);History of falling (Z91.81);Pain                Time: 0349-1791 OT Time Calculation (min): 22 min Charges:  OT General Charges $OT Visit: 1 Visit OT Evaluation $OT Eval Moderate Complexity: 1 Mod   Parveen Freehling A [employer] 06/29/2021, 3:17 PM

## 2021-06-29 NOTE — Progress Notes (Signed)
PROGRESS NOTE    Aaron PETTEWAY Sr.  SNK:[id number] DOB: [birth year] DOA: 06/28/2021 PCP: [name], MD  Chief Complaint  Patient presents with   Loss of Consciousness    Brief Narrative:   Aaron Fee Sr. is a 66 y.o. 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 This syncopal episode is very similar to previous ones due to dehydration with the exception of 30 seconds of convulsion, which is new.no new deficits.    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  EEG is negative for seizures.  Neurology consulted and recommendations given.  No further work up needed at this time.  Therapy eval recommended.  Orthostatic vital signs pending.  Recent echocardiogram reviewed. CT of the head without any acute findings. Overnight telemetry does not show any arrhythmias.  MRI brain is negative for acute intra cranial pathology.     Acute multiple nondisplaced rib fractures Pain control   Diffuse large B cell lymphoma of multiple sites Patient follows up with Dr. Marin Olp and is on chemotherapy. S/p 2 cycles of chemotherapy    Acute Urinary Retention: - continue with foley care.    Pancytopenia:   Transfuse to keep hemoglobin greater than 7.  Platelet count of 55   PAF:  CHA2DS2-VASc score is 4 not on anticoagulation at this time. Rate controlled Continue with aspirin .    COPD;  No wheezing heard.  Continue with bronchodilators.     AAA S/p repair.    History of COVID-19 infection in view of his immunocompromise status we will continue with isolation. Patient completed the course of remdesivir in January.    MRSA bacteremia Complete the course of antibiotics as per ID recommendations of doxycycline.    Hyponatremia probably secondary to Cymbalta Continue to monitor.   Diabetes mellitus Uncontrolled with hyperglycemia Insulin-dependent CBG (last 3)  Recent Labs    06/29/21 0612 06/29/21 0739 06/29/21 1122  GLUCAP 254* 256* 237*   Increase semglee to 24 units daily and continue with SSI and novolog TIDAC.      DVT prophylaxis: (scd's) Code Status: Full code.  Family Communication: none at bedside.  Disposition:   Status is: Observation The patient will require care spanning > 2 midnights and should be moved to inpatient because: further work up for syncope.          Consultants:  Oncology.  Neurology.  Palliative care  Procedures:  EEG This study is within normal limits. No seizures or epileptiform discharges were seen throughout the recording.  Antimicrobials: none.   Subjective: Feeling better.   Objective: Vitals:   06/29/21 1200 06/29/21 1317 06/29/21 1318 06/29/21 1351  BP:  114/68 114/68 117/76  Pulse:  81 81  79  Resp:  16 16 16   Temp:  97.8 F (36.6 C) 97.8 F (36.6 C) (!) 97.4 F (36.3 C)  TempSrc:  Oral Oral Oral  SpO2: 96% 97% 97% 98%    Intake/Output Summary (Last 24 hours) at 06/29/2021 1425 Last data filed at 06/29/2021 1302 Gross per 24 hour  Intake 1763.35 ml  Output 975 ml  Net 788.35 ml   There were no vitals filed for this visit.  Examination:  General exam: Appears calm and comfortable   Respiratory system: Clear to auscultation. Respiratory effort normal. Cardiovascular system: S1 & S2 heard, RRR. No JVD,  No pedal edema. Gastrointestinal system: Abdomen is nondistended, soft and nontender. Normal bowel sounds heard. Central nervous system: Alert and oriented. No focal neurological deficits. Extremities: Symmetric 5 x 5 power. Skin: No rashes, lesions or ulcers Psychiatry: Mood & affect appropriate.     Data Reviewed: I have personally reviewed following labs and imaging studies  CBC: Recent Labs  Lab 06/28/21 0915 06/29/21 0043  WBC 2.1*   2.2* 2.3*  NEUTROABS 1.4*  --   HGB 9.5*   9.6* 8.1*  HCT 27.4*   27.4* 22.9*  MCV 80.8   80.6 81.5  PLT 53*   65* 55*    Basic Metabolic Panel: Recent Labs  Lab 06/28/21 0915 06/28/21 1447 06/29/21 0043  NA 130*  --  134*  K 4.8  --  4.5  CL 97*  --  103  CO2 23  --  23  GLUCOSE 371*  --  271*  BUN 21  --  16  CREATININE 1.05  --  1.09  CALCIUM 9.1  --  8.4*  MG  --  1.6*  --     GFR: Estimated Creatinine Clearance: 72.2 mL/min (by C-G formula based on SCr of 1.09 mg/dL).  Liver Function Tests: Recent Labs  Lab 06/28/21 0915  AST 23  ALT 29  ALKPHOS 68  BILITOT 1.3*  PROT 7.1  ALBUMIN 3.4*    CBG: Recent Labs  Lab 06/28/21 1744 06/28/21 2108 06/29/21 0612 06/29/21 0739 06/29/21 1122  GLUCAP 281* 328* 254* 256* 237*     Recent Results (from the past 240 hour(s))  Blood culture (routine x 2)     Status: None (Preliminary result)   Collection Time: 06/28/21  9:15 AM   Specimen: BLOOD  Result Value [place] Status   Specimen Description   Final    BLOOD RIGHT ANTECUBITAL Performed at [facility], [address] [place], [place] [address]    Special Requests   Final    BOTTLES DRAWN AEROBIC AND ANAEROBIC Blood Culture results may not be optimal due to an excessive volume of blood received in culture bottles Performed at [facility], [address] [place], [place] [address]    Culture   Final    NO GROWTH < 24 HOURS Performed at [facility], [place] [address], [place], [place] [address]    Report Status PENDING  Incomplete  Blood culture (routine x 2)     Status: None (Preliminary result)   Collection Time: 06/28/21 12:25 PM   Specimen: BLOOD LEFT HAND  Result Value Ref [place] Status   Specimen Description   Final    BLOOD LEFT HAND Performed at [facility], [address] [place], [place] [address]    Special Requests   Final    BOTTLES DRAWN AEROBIC AND ANAEROBIC Blood Culture  adequate volume Performed at [facility], [address] [place], [place] [address]    Culture   Final    NO GROWTH < 24 HOURS Performed at [facility], [place] [address], [place], [place] [address]    Report Status PENDING  Incomplete  Resp Panel by RT-PCR (Flu A&B, Covid) Nasopharyngeal Swab     Status: Abnormal   Collection Time: 06/28/21  2:32 PM   Specimen: Nasopharyngeal Swab; Nasopharyngeal(NP) swabs in vial transport medium  Result Value Ref [place] Status   SARS Coronavirus 2 by RT PCR POSITIVE (A) NEGATIVE Final    Comment: (NOTE) SARS-CoV-2 target nucleic acids are DETECTED.  The SARS-CoV-2 RNA is generally detectable in upper respiratory specimens during the acute phase of infection. Positive results are indicative of the presence of the identified virus, but do not rule out bacterial infection or co-infection with other pathogens not detected by the test. Clinical correlation with patient history and other diagnostic information is necessary to determine patient infection status. The expected result is Negative.  Fact Sheet for Patients: EntrepreneurPulse.com.au  Fact Sheet for Healthcare Providers: IncredibleEmployment.be  This test is not yet approved or cleared by the [place] FDA and  has been authorized for detection and/or diagnosis of SARS-CoV-2 by FDA under  an Emergency Use Authorization (EUA).  This EUA will remain in effect (meaning this test can be used) for the duration of  the COVID-19 declaration under Section 564(b)(1) of the A ct, 21 U.S.C. section 360bbb-3(b)(1), unless the authorization is terminated or revoked sooner.     Influenza A by PCR NEGATIVE NEGATIVE Final   Influenza B by PCR NEGATIVE NEGATIVE Final    Comment: (NOTE) The Xpert Xpress SARS-CoV-2/FLU/RSV plus assay is intended as an aid in the diagnosis of influenza from Nasopharyngeal swab specimens and should not be used as a sole basis for treatment. Nasal washings and aspirates are unacceptable for Xpert Xpress SARS-CoV-2/FLU/RSV testing.  Fact Sheet for Patients: EntrepreneurPulse.com.au  Fact Sheet for Healthcare Providers: IncredibleEmployment.be  This test is not yet approved or cleared by the [place] FDA and has been authorized for detection and/or diagnosis of SARS-CoV-2 by FDA under an Emergency Use Authorization (EUA). This EUA will remain in effect (meaning this test can be used) for the duration of the COVID-19 declaration under Section 564(b)(1) of the Act, 21 U.S.C. section 360bbb-3(b)(1), unless the authorization is terminated or revoked.  Performed at [facility], [address] [place], [place] [address]          Radiology Studies: DG Ribs Unilateral W/Chest Left  Result Date: 06/28/2021 CLINICAL DATA:  [place] EXAM: LEFT RIBS AND CHEST - 3+ VIEW COMPARISON:  05/29/2021 FINDINGS: Minimally displaced fracture, lateral aspect left eighth rib. Displaced fracture, anterolateral left ninth rib. Minimally distracted fracture, left tenth rib. No pneumothorax or effusion evident. Surgical clips right axilla. Abdominal aortic stent partially visualized. Previous cement augmentation of T12. IMPRESSION: 1. Left eighth, ninth, tenth rib fractures without effusion or pneumothorax. Electronically Signed    By: Lucrezia Europe M.D.   On: 06/28/2021 10:20   CT Head Wo Contrast  Result Date: 06/28/2021 CLINICAL DATA:  Loss of consciousness this morning on the way to the bathroom, atrial fibrillation, has been on Holter monitor for 2 weeks. History COPD, type II diabetes mellitus, hypertension EXAM: CT HEAD WITHOUT CONTRAST TECHNIQUE: Contiguous axial images were obtained from the base of the skull through the vertex without intravenous contrast. RADIATION DOSE  REDUCTION: This exam was performed according to the departmental dose-optimization program which includes automated exposure control, adjustment of the mA and/or kV according to patient size and/or use of iterative reconstruction technique. COMPARISON:  05/29/2021 FINDINGS: Brain: Generalized atrophy. Normal ventricular morphology. No midline shift or mass effect. Normal appearance of brain parenchyma. No intracranial hemorrhage, mass lesion, or evidence of acute infarction. No extra-axial fluid collections. Vascular: Atherosclerotic calcifications of internal carotid arteries at skull base Skull: Intact Sinuses/Orbits: Clear Other: N/A IMPRESSION: Generalized atrophy. No acute intracranial abnormalities. Electronically Signed   By: Lavonia Dana M.D.   On: 06/28/2021 12:39   MR BRAIN W WO CONTRAST  Result Date: 06/28/2021 CLINICAL DATA:  Seizure EXAM: MRI HEAD WITHOUT AND WITH CONTRAST TECHNIQUE: Multiplanar, multiecho pulse sequences of the brain and surrounding structures were obtained without and with intravenous contrast. CONTRAST:  8mL GADAVIST GADOBUTROL 1 MMOL/ML IV SOLN COMPARISON:  MRI 06/14/2018 correlation is also made with CT head 06/28/2021 FINDINGS: Brain: No restricted diffusion to suggest acute or subacute infarct. No acute hemorrhage, mass, mass effect, or midline shift. No hydrocephalus or extra-axial collection. No abnormal parenchymal or meningeal enhancement. The hippocampi are symmetric in size and normal in signal. No evidence of  heterotopia or cortical dysgenesis. Scattered T2 hyperintense signal in the periventricular white matter, likely the sequela of mild chronic small vessel ischemic disease. Vascular: Normal flow voids. Skull and upper cervical spine: Normal marrow signal. Sinuses/Orbits: Small mucous retention cyst in the left maxillary sinus. Otherwise negative. The orbits are unremarkable. Other: Fluid in the right-greater-than-left mastoid air cells. IMPRESSION: No acute intracranial process.  No seizure etiology identified. Electronically Signed   By: Merilyn Baba M.D.   On: 06/28/2021 23:57   CT CHEST ABDOMEN PELVIS W CONTRAST  Result Date: 06/28/2021 CLINICAL DATA:  Loss of consciousness and fall with trauma to chest and left rib pain with abrasions. History of lymphoma. EXAM: CT CHEST, ABDOMEN, AND PELVIS WITH CONTRAST TECHNIQUE: Multidetector CT imaging of the chest, abdomen and pelvis was performed following the standard protocol during bolus administration of intravenous contrast. RADIATION DOSE REDUCTION: This exam was performed according to the departmental dose-optimization program which includes automated exposure control, adjustment of the mA and/or kV according to patient size and/or use of iterative reconstruction technique. CONTRAST:  18mL OMNIPAQUE IOHEXOL 300 MG/ML  SOLN COMPARISON:  CT of the abdomen and pelvis on 05/30/2021, CTA of the abdomen and pelvis on 02/25/2021, PET scan on 04/15/2021. FINDINGS: CT CHEST FINDINGS Cardiovascular: Normal caliber thoracic aorta without significant atherosclerosis. Normal caliber central pulmonary arteries. Evidence of coronary atherosclerosis with calcified plaque present. The heart size is normal. No pericardial fluid identified. Mediastinum/Nodes: No enlarged mediastinal, hilar, or axillary lymph nodes. Thyroid gland, trachea, and esophagus demonstrate no significant findings. No evidence of mediastinal hemorrhage. Lungs/Pleura: Stable chronic lung disease with  primarily peripheral interstitial thickening and potential early fibrotic disease. There is no evidence of pulmonary edema, consolidation, pneumothorax, nodule or pleural fluid. Musculoskeletal: There are acute nondisplaced fractures involving the left ninth, tenth, eleventh and twelfth ribs. No significant associated chest wall hemorrhage. Old healed fracture of the posterior left ninth rib. 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 CT ABDOMEN PELVIS FINDINGS Hepatobiliary: Stable hepatic steatosis. Normal gallbladder. No biliary ductal dilatation or masses. Pancreas: Unremarkable. No pancreatic ductal dilatation or surrounding inflammatory changes. Spleen: Normal in size without focal abnormality. Adrenals/Urinary Tract: Stable soft tissue fullness  in the sinus fat of the left kidney extending towards the collecting system but not associated with significant hydronephrosis or delay in contrast excretion on the delayed images. Stable 8 mm left adrenal nodule is likely consistent with an adenoma. The bladder is decompressed by a Foley catheter. Stomach/Bowel: Bowel shows no evidence of obstruction, ileus, inflammation or lesion. No free intraperitoneal air. 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 Stable excluded aneurysm sac size of approximately 3.3 x 4.2 cm without evidence of endoleak on delayed images. Endograft limbs remain normally patent. Diminished prominence of left common iliac lymph node measuring 1.5 cm compared to 2.0 cm previously. Reproductive: Prostate is unremarkable. Other: Stable small left inguinal hernia containing fat. No ascites or abnormal fluid collections. Musculoskeletal: Stable chronic compression fracture of T12 with prior vertebral augmentation. IMPRESSION: 1. Acute nondisplaced fractures of the left ninth, tenth, eleventh and twelfth ribs without associated  pneumothorax, hemothorax or significant chest wall contusion/hematoma. 2. Stable old fractures of the left ninth and right eighth and ninth ribs. 3. Stable soft tissue fullness in the sinus fat of the left kidney without associated significant hydronephrosis. 4. Stable appearance of the aorta status post EVAR without evidence of endoleak. 5. Decrease in size of a left common iliac lymph node since prior CT on 05/30/2021. Next number stable small left inguinal hernia containing fat. Electronically Signed   By: Aletta Edouard M.D.   On: 06/28/2021 12:49   EEG adult  Result Date: 06/29/2021 Lora Havens, MD     06/29/2021 10:06 AM Patient Name: Aaron Fee Sr. MRN: [id number] Epilepsy Attending: Lora Havens Referring Physician/Provider: Kerney Elbe, MD Date: 06/28/2021 Duration: 22.13 mins Patient history: 66 year old male presented with syncope and about 30 seconds open wound..  EEG evaluate for seizure Level of alertness: Awake AEDs during EEG study: None Technical aspects: This EEG study was done with scalp electrodes positioned according to the 10-20 International system of electrode placement. Electrical activity was acquired at a sampling rate of 500Hz  and reviewed with a high frequency filter of 70Hz  and a low frequency filter of 1Hz . EEG data were recorded continuously and digitally stored. Description: The posterior dominant rhythm consists of 8 Hz activity of moderate voltage (25-35 uV) seen predominantly in posterior head regions, symmetric and reactive to eye opening and eye closing. Hyperventilation and photic stimulation were not performed.   IMPRESSION: This study is within normal limits. No seizures or epileptiform discharges were seen throughout the recording. Priyanka Barbra Sarks        Scheduled Meds:  allopurinol  100 mg Oral Daily   arformoterol  15 mcg Nebulization BID   aspirin EC  81 mg Oral Daily   atenolol  50 mg Oral Daily   Chlorhexidine Gluconate Cloth  6 each  Topical Daily   diclofenac Sodium  4 g Topical QID   doxycycline  100 mg Oral BID   DULoxetine  60 mg Oral QHS   famciclovir  250 mg Oral BID   insulin aspart  0-5 Units Subcutaneous QHS   insulin aspart  0-9 Units Subcutaneous TID WC   insulin glargine-yfgn  20 Units Subcutaneous QHS   lidocaine  1 patch Transdermal Q24H   pantoprazole  80 mg Oral Daily   potassium chloride SA  20 mEq Oral Daily   rosuvastatin  10 mg Oral Daily   sodium chloride flush  3 mL Intravenous Q12H   tamsulosin  0.4 mg Oral QPM   Continuous Infusions:  sodium  chloride 10 mL/hr at 06/29/21 0758     LOS: 0 days        Hosie Poisson, MD [facility]   To contact the attending provider between 7A-7P or the covering provider during after hours 7P-7A, please log into the web site [email or web address] and access using universal [place] password for that web site. If you do not have the password, please call the hospital operator.  06/29/2021, 2:25 PM

## 2021-06-29 NOTE — TOC CAGE-AID Note (Signed)
Transition of Care (TOC) - CAGE-AID Screening   Patient Details  Name: Aaron MOTTA Sr. MRN: [id number] Date of Birth: [birth year]  Transition of Care [facility]) CM/SW Contact:    Kacin Dancy C Tarpley-Carter, [place] Phone Number: 06/29/2021, 10:10 AM   Clinical Narrative: Pt is unable to participate in Cage Aid. Pt is currently not in room.  CSW will assess at a better time.  Lainie Daubert Tarpley-Carter, MSW, LCSW-A Pronouns:  She/Her/Hers [place] Transitions of Care Clinical Social Worker Direct Number:  [phone] [email or web address]  CAGE-AID Screening: Substance Abuse Screening unable to be completed due to: : Patient unable to participate             Substance Abuse Education Offered: No

## 2021-06-29 NOTE — Care Management Obs Status (Signed)
[place] NOTIFICATION   Patient Details  Name: ALAKAI MACBRIDE Sr. MRN: [id number] Date of Birth: Dec 03, 1955   Medicare Observation Status Notification Given:  Yes    Pollie Friar, RN 06/29/2021, 2:53 PM

## 2021-06-29 NOTE — Procedures (Signed)
Patient Name: Aaron PREAST Sr.  MRN: [id number]  Epilepsy Attending: Lora Havens  Referring Physician/Provider: Kerney Elbe, MD Date: 06/28/2021 Duration: 22.13 mins  Patient history: 67 year old male presented with syncope and about 30 seconds open wound..  EEG evaluate for seizure  Level of alertness: Awake  AEDs during EEG study: None  Technical aspects: This EEG study was done with scalp electrodes positioned according to the 10-20 International system of electrode placement. Electrical activity was acquired at a sampling rate of 500Hz  and reviewed with a high frequency filter of 70Hz  and a low frequency filter of 1Hz . EEG data were recorded continuously and digitally stored.   Description: The posterior dominant rhythm consists of 8 Hz activity of moderate voltage (25-35 uV) seen predominantly in posterior head regions, symmetric and reactive to eye opening and eye closing. Hyperventilation and photic stimulation were not performed.     IMPRESSION: This study is within normal limits. No seizures or epileptiform discharges were seen throughout the recording.  Aaron Weaver Barbra Sarks

## 2021-06-29 NOTE — Consult Note (Signed)
Referral MD  Reason for Referral: Syncope; diffuse large cell non-Hodgkin lymphoma; recent MRSA bacteremia-resolved  Chief Complaint  Patient presents with   Loss of Consciousness  : I passed out   HPI: [name] is well-known to me.  He is a 66 year old male.  He has diffuse large cell non-Hodgkin's lymphoma.  He has extranodal disease.  He has had 2 cycles of chemotherapy with R-CHOP.  His last cycle was given on 06/17/2021.  He was hospitalized for quite a while in January with MRSA bacteremia.  This is from an abscess under the right arm.  He had a Port-A-Cath at the time that was removed and did have MRSA on him.  He has been on antibiotics.  He passed out a couple days ago at home.  He was admitted.  He was dehydrated.  He had an MRI of the brain that was unremarkable.  Neurology has been following him.  Thankfully when he came in, he was not neutropenic.  This morning, his white count is 2.3.  Hemoglobin 8.1.  Platelet count is 55,000.  He did break some ribs.  We have to go ahead and transfuse him.  He will get 2 units of blood.  I talked about the transfusion.  Explained to him how the transfusion worked.  I explained that the transfusions are safe.  It will certainly help with him feeling better.  We will help with hydration.  While he is in the hospital, he will also get a Port-A-Cath.  He was due to have 1 next week.  I think that since he is in the hospital, he needs to have one this week.  He has hyperglycemia.  He has had no fever.  Cultures have been taken and these are pending.  This morning he was alert and oriented.  He look quite good.  He felt okay outside of the rib pain from the fractures.  He has had a decent appetite.  He has had no nausea or vomiting.  He has a indwelling Foley catheter.  He has had no diarrhea.  He has had no mouth sores.  He had a CT scan done of the chest/abdomen/pelvis.  This did not show any progression of his lymphoma.  The lymphoma  was responding.  He had fractures in the lower left ribs.  Currently, I would say his performance status is probably ECOG 1.   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 ([place]) 05/04/2021   Dysrhythmia    GERD (gastroesophageal reflux disease)    Impacted cerumen    Impotence of organic origin    Lumbago    Lung nodule    Other and unspecified hyperlipidemia    Routine general medical examination at a health care facility    Skipped heart beats    Tobacco use disorder    Type II or unspecified type diabetes mellitus without mention of complication, not stated as uncontrolled    type II   Unspecified essential hypertension   :   Past Surgical History:  Procedure Laterality Date   ABDOMINAL AORTIC ENDOVASCULAR STENT GRAFT Bilateral 07/12/2020  Procedure: ABDOMINAL AORTIC ENDOVASCULAR STENT GRAFT;  Surgeon: [name], MD;  Location: [facility];  Service: Vascular;  Laterality: Bilateral;   AXILLARY LYMPH NODE BIOPSY Right 04/27/2021   Procedure: AXILLARY EXCISIONAL LYMPH NODE BIOPSY;  Surgeon: Dwan Bolt, MD;  Location: [place];  Service: General;  Laterality: Right;   BACK SURGERY     fracture   CARPECTOMY Right 09/11/2019   Procedure: PROXIMAL ROW CARPECTOMY; RADIAL STYLOIDECTOMY; POSTERIOR INTEROSSIUS NERVE RESECTION;  Surgeon: Daryll Brod, MD;  Location: [place];  Service: Orthopedics;  Laterality: Right;  AXILLARY BLOCK   COLONOSCOPY     INGUINAL HERNIA REPAIR     right   IR FLUORO GUIDE CV LINE RIGHT  06/16/2021   IR US GUIDE VASC ACCESS LEFT  06/16/2021   POLYPECTOMY     PORT-A-CATH REMOVAL N/A 06/01/2021   Procedure: REMOVAL  PORT-A-CATH;  Surgeon: Armandina Gemma, MD;  Location: [facility];  Service: General;  Laterality: N/A;   PORTACATH PLACEMENT Right 05/13/2021   Procedure: INSERTION PORT-A-CATH;  Surgeon: Dwan Bolt, MD;  Location: [facility];  Service: General;  Laterality: Right;   TEE WITHOUT CARDIOVERSION N/A 06/06/2021   Procedure: TRANSESOPHAGEAL ECHOCARDIOGRAM (TEE);  Surgeon: Lelon Perla, MD;  Location: [facility];  Service: Cardiovascular;  Laterality: N/A;   TONSILLECTOMY    :   Current Facility-Administered Medications:    0.9 %  sodium chloride infusion (Manually program via Guardrails IV Fluids), , Intravenous, Once, Kasondra Junod, Rudell Cobb, MD   0.9 %  sodium chloride infusion, , Intravenous, Continuous, Orma Flaming, MD, Last Rate: 75 mL/hr at 06/28/21 1858, New Bag at 06/28/21 1858   acetaminophen (TYLENOL) tablet 650 mg, 650 mg, Oral, Q6H PRN **OR** acetaminophen (TYLENOL) suppository 650 mg, 650 mg, Rectal, Q6H PRN, Orma Flaming, MD   acetaminophen (TYLENOL) tablet 650 mg, 650 mg, Oral, Once, Shoaib Siefker, Rudell Cobb, MD   allopurinol (ZYLOPRIM) tablet 100 mg, 100 mg, Oral, Daily, Orma Flaming, MD   arformoterol [facility]) nebulizer solution 15 mcg, 15 mcg, Nebulization, BID, Orma Flaming, MD   aspirin EC tablet 81 mg, 81 mg, Oral, Daily, Orma Flaming, MD   atenolol (TENORMIN) tablet 50 mg, 50 mg, Oral, Daily, Orma Flaming, MD   Chlorhexidine Gluconate Cloth 2 % PADS 6 each, 6 each, Topical, Daily, Orma Flaming, MD   diphenhydrAMINE (BENADRYL) capsule 25 mg, 25 mg, Oral, Once, Evia Goldsmith, Rudell Cobb, MD   doxycycline (VIBRA-TABS) tablet 100 mg, 100 mg, Oral, BID, Orma Flaming, MD, 100 mg at 06/28/21 2209   DULoxetine (CYMBALTA) DR capsule 60 mg, 60 mg, Oral, QHS, Orma Flaming, MD, 60 mg at 06/28/21 2209   famciclovir [facility]) tablet 250 mg, 250 mg, Oral, BID, Orma Flaming, MD, 250 mg at 06/28/21 2209   furosemide (LASIX) injection 20 mg, 20 mg, Intravenous, Once, Talia Hoheisel, Rudell Cobb, MD    furosemide (LASIX) injection 20 mg, 20 mg, Intravenous, Once, Lucresia Simic, Rudell Cobb, MD   insulin aspart (novoLOG) injection 0-5 Units, 0-5 Units, Subcutaneous, QHS, Alcario Drought, Jared M, DO, 4 Units at 06/28/21 2210   insulin aspart (novoLOG) injection 0-9 Units, 0-9 Units, Subcutaneous, TID WC, Orma Flaming, MD, 5 Units at 06/28/21 1753   insulin glargine-yfgn (SEMGLEE) injection 20 Units, 20 Units, Subcutaneous, QHS, Orma Flaming, MD, 20 Units at 06/28/21 2210   lidocaine (LIDODERM) 5 % 1 patch, 1 patch, Transdermal, Q24H, Orma Flaming, MD, 1 patch at 06/28/21 2209   morphine (PF) 2 MG/ML injection 1 mg, 1 mg, Intravenous, Q3H PRN,  Orma Flaming, MD   oxyCODONE (Oxy IR/ROXICODONE) immediate release tablet 5 mg, 5 mg, Oral, Q4H PRN, Orma Flaming, MD, 5 mg at 06/29/21 0210   pantoprazole (PROTONIX) EC tablet 80 mg, 80 mg, Oral, Daily, Orma Flaming, MD, 80 mg at 06/28/21 2209   potassium chloride SA (KLOR-CON M) CR tablet 20 mEq, 20 mEq, Oral, Daily, Orma Flaming, MD   rosuvastatin (CRESTOR) tablet 10 mg, 10 mg, Oral, Daily, Orma Flaming, MD   sodium chloride flush (NS) 0.9 % injection 3 mL, 3 mL, Intravenous, Q12H, Orma Flaming, MD   tamsulosin [facility]) capsule 0.4 mg, 0.4 mg, Oral, QPM, Orma Flaming, MD, 0.4 mg at 06/28/21 2209:   sodium chloride   Intravenous Once   acetaminophen  650 mg Oral Once   allopurinol  100 mg Oral Daily   arformoterol  15 mcg Nebulization BID   aspirin EC  81 mg Oral Daily   atenolol  50 mg Oral Daily   Chlorhexidine Gluconate Cloth  6 each Topical Daily   diphenhydrAMINE  25 mg Oral Once   doxycycline  100 mg Oral BID   DULoxetine  60 mg Oral QHS   famciclovir  250 mg Oral BID   furosemide  20 mg Intravenous Once   furosemide  20 mg Intravenous Once   insulin aspart  0-5 Units Subcutaneous QHS   insulin aspart  0-9 Units Subcutaneous TID WC   insulin glargine-yfgn  20 Units Subcutaneous QHS   lidocaine  1 patch Transdermal Q24H   pantoprazole  80  mg Oral Daily   potassium chloride SA  20 mEq Oral Daily   rosuvastatin  10 mg Oral Daily   sodium chloride flush  3 mL Intravenous Q12H   tamsulosin  0.4 mg Oral QPM  :   Allergies  Allergen Reactions   Fexofenadine Other (See Comments)    "dries me out too much"   Hydrochlorothiazide Other (See Comments)    Cramps   Penicillins Hives   Pravastatin Sodium Other (See Comments)    Aches   Requip [Ropinirole Hcl] Other (See Comments)    Bad dreams  :   Family History  Problem Relation Age of Onset   Colon cancer Mother 34   Hypertension Father    Coronary artery disease Other        male<60 and male<50  1st degree relative   Colon polyps Brother    Esophageal cancer Neg Hx    Rectal cancer Neg Hx    Stomach cancer Neg Hx   :   Social History   Socioeconomic History   Marital status: Married    Spouse name: Jaaron Oleson   Number of children: 1   Years of education: Not on file   Highest education level: Not on file  Occupational History   Occupation: [employer]  Tobacco Use   Smoking status: Former    Packs/day: 0.50    Years: 53.00    Pack years: 26.50    Types: Cigarettes    Start date: 03/31/1965    Quit date: 07/22/2017    Years since quitting: 3.9   Smokeless tobacco: Never   Tobacco comments:    started at age 59.  Vaping Use   Vaping Use: Never used  Substance and Sexual Activity   Alcohol use: Yes    Comment: rare beer once or twice   Drug use: No   Sexual activity: Yes  Other Topics Concern   Not on file  Social History Narrative  Not on file   Social Determinants of Health   Financial Resource Strain: Not on file  Food Insecurity: Not on file  Transportation Needs: Not on file  Physical Activity: Not on file  Stress: Not on file  Social Connections: Not on file  Intimate Partner Violence: Not on file  :  Pertinent items are noted in HPI.  Exam: Patient Vitals for the past 24 hrs:  BP Temp Temp src Pulse Resp SpO2   06/29/21 0312 107/70 97.9 F (36.6 C) Oral 86 19 95 %  06/29/21 0023 112/70 (!) 97.5 F (36.4 C) Oral 86 18 98 %  06/28/21 2019 121/70 97.8 F (36.6 C) Oral 74 20 --  06/28/21 1631 130/76 97.7 F (36.5 C) Oral 60 16 100 %  06/28/21 1528 (!) 141/91 97.6 F (36.4 C) Oral 69 18 98 %  06/28/21 1415 120/74 -- -- 65 18 96 %  06/28/21 1330 123/83 -- -- 68 18 94 %  06/28/21 1300 112/62 -- -- 66 17 93 %  06/28/21 1235 -- (!) 97.5 F (36.4 C) Rectal -- -- --  06/28/21 1230 112/76 -- -- 65 19 97 %  06/28/21 1217 107/73 97.7 F (36.5 C) Oral 66 16 94 %  06/28/21 1100 114/73 -- -- 64 12 97 %  06/28/21 1025 114/75 -- -- 69 (!) 21 100 %  06/28/21 1000 112/73 -- -- 66 18 98 %  06/28/21 0930 103/80 -- -- 70 (!) 24 98 %  06/28/21 0900 108/76 98.1 F (36.7 C) -- 71 17 100 %   Physical Exam Vitals reviewed.  HENT:     Head: Normocephalic and atraumatic.  Eyes:     Pupils: Pupils are equal, round, and reactive to light.  Cardiovascular:     Rate and Rhythm: Normal rate and regular rhythm.     Heart sounds: Normal heart sounds.  Pulmonary:     Effort: Pulmonary effort is normal.     Breath sounds: Normal breath sounds.  Abdominal:     General: Bowel sounds are normal.     Palpations: Abdomen is soft.  Musculoskeletal:        General: No tenderness or deformity. Normal range of motion.     Cervical back: Normal range of motion.  Lymphadenopathy:     Cervical: No cervical adenopathy.  Skin:    General: Skin is warm and dry.     Findings: No erythema or rash.  Neurological:     Mental Status: He is alert and oriented to person, place, and time.  Psychiatric:        Behavior: Behavior normal.        Thought Content: Thought content normal.        Judgment: Judgment normal.      Recent Labs    06/28/21 0915 06/29/21 0043  WBC 2.1*   2.2* 2.3*  HGB 9.5*   9.6* 8.1*  HCT 27.4*   27.4* 22.9*  PLT 53*   65* 55*    Recent Labs    06/28/21 0915 06/29/21 0043  NA 130* 134*  K  4.8 4.5  CL 97* 103  CO2 23 23  GLUCOSE 371* 271*  BUN 21 16  CREATININE 1.05 1.09  CALCIUM 9.1 8.4*    Blood smear review: None  Pathology: None    Assessment and Plan: [name] is a 66 year old white male.  He has large cell non-Hodgkin's lymphoma.  He has had 2 cycles of treatment.  He is being treated  with R-CHOP.  I would have to think that what happened was dehydration.  I cannot imagine this being a neurological issue.  The MRI of the brain looked okay.  I do think that a transfusion will help.  Again we will give him 2 units of blood.  He is not neutropenic.  Cultures are pending.  I would hold on any antibiotics for right now.  Again, while he is in the hospital, we will see if interventional radiology can put in a Port-A-Cath.  Medical Center reason why they would not put a Port-A-Cath in.  There is no active infection.  His blood counts are okay.  Platelet count is little bit on the lower side but I would think this would not be an issue with placing it port.  We will follow along.  We will help out any way that we can.  I know that he will get incredible care from all the staff on [place], MD  Vonna Kotyk 1:9

## 2021-06-29 NOTE — Progress Notes (Signed)
Inpatient Diabetes Program Recommendations  AACE/ADA: New Consensus Statement on Inpatient Glycemic Control   Target Ranges:  Prepandial:   less than 140 mg/dL      Peak postprandial:   less than 180 mg/dL (1-2 hours)      Critically ill patients:  140 - 180 mg/dL    Latest Reference Range & Units 06/29/21 06:12 06/29/21 07:39  Glucose-Capillary 70 - 99 mg/dL 254 (H) 256 (H)    Latest Reference Range & Units 06/28/21 09:24 06/28/21 17:44  Glucose-Capillary 70 - 99 mg/dL 338 (H) 281 (H)   Review of Glycemic Control  Diabetes history: DM2 Outpatient Diabetes medications: Lantus 18 units daily, Humalog 2-15 units QID Current orders for Inpatient glycemic control: Semglee 20 units QHS, Novolog 0-9 units TID with meals, Novolog 0-5 units QHS  Inpatient Diabetes Program Recommendations:    Insulin: Please consider increasing Semglee to 24 units QHS and ordering Novolog 3 units TID with meals for meal coverage if patient eats at least 50% of meals.  Thanks, Barnie Alderman, RN, MSN, CDE Diabetes Coordinator Inpatient Diabetes Program [phone] (Team Pager from 8am to 5pm)

## 2021-06-29 NOTE — Progress Notes (Addendum)
Request received for [facility] placement.   Patient has pending blood cx, obtained yesterday.  Patient has recent hx of MRSA bacteremia on 1/30, taking abx per ID recommendation, last doxy will be given on Friday 2/24.   IR will not be able to proceed with PAC placement with pending blood cx result, can move forward when blood cx shows no growth x 4 days which will be this Saturday.  PAC placement will not be done on weekend.  The earliest [facility] can be placed will be next Monday, if blood cx negative.  IR will plan on [facility] placement on Monday 2/27 if pt remains inpt.   PAC can be placed as an outpatient, and this patient is scheduled for our pt PAC placement at [facility] Thursday 07/07/21.   Dr. Marin Olp notified via secure chat.  Please call IR for questions and concerns regarding PAC placement.    Armando Gang Hakiem Malizia PA-C 06/29/2021 9:25 AM

## 2021-06-29 NOTE — TOC Initial Note (Signed)
Transition of Care (TOC) - Initial/Assessment Note    Patient Details  Name: Aaron JOHANSON Sr. MRN: [id number] Date of Birth: December 25, 1955  Transition of Care [facility]) CM/SW Contact:    Pollie Friar, RN Phone Number: 06/29/2021, 2:58 PM  Clinical Narrative:                 Patient is from home with his spouse. Since he is on covid isolation CM was able to speak to his spouse over the phone. She denies any issues with home medications or transportation.  Awaiting therapy recommendations.  TOC following.  Expected Discharge Plan: Home/Self Care Barriers to Discharge: Continued Medical Work up   Patient Goals and CMS Choice        Expected Discharge Plan and Services Expected Discharge Plan: Home/Self Care   Discharge Planning Services: CM Consult   Living arrangements for the past 2 months: Single Family Home                                      Prior Living Arrangements/Services Living arrangements for the past 2 months: Single Family Home Lives with:: Spouse Patient language and need for interpreter reviewed:: Yes          Care giver support system in place?: Yes (comment) Current home services: DME (elevated toilet/ bars in bathroom) Criminal Activity/Legal Involvement Pertinent to Current Situation/Hospitalization: No - Comment as needed  Activities of Daily Living      Permission Sought/Granted                  Emotional Assessment           Psych Involvement: No (comment)  Admission diagnosis:  Syncope and collapse [R55] Closed fracture of multiple ribs of left side, initial encounter [S22.42XA] Patient Active Problem List   Diagnosis Date Noted   Syncope and collapse 06/28/2021   Multiple rib fractures 06/28/2021   Hyponatremia 06/28/2021   History of COVID-19 06/28/2021   Paroxysmal atrial fibrillation ([place]) 06/14/2021   Dilated aortic root ([place]) 06/14/2021   Abscess    Axillary tenderness, right 05/31/2021   MRSA bacteremia  05/31/2021   Pancytopenia ([place]) 05/30/2021   AKI (acute kidney injury) ([place]) 05/30/2021   Acute urinary retention 05/30/2021   Diarrhea 05/30/2021   Severe sepsis ([place]) 05/29/2021   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 ([place]) 05/04/2021   Soft tissue mass 03/22/2021   COPD (chronic obstructive pulmonary disease) ([place]) 10/14/2020   AAA (abdominal aortic aneurysm) without rupture 06/15/2020   Restless leg syndrome 07/26/2011   Hip pain, left 03/28/2011   ERECTILE DYSFUNCTION, 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   Lumbago 07/22/2010   Pulmonary nodule 1 cm or greater in diameter 01/07/2009   SINUSITIS, ACUTE 06/18/2008   CRAMPS,LEG 06/18/2008   TOBACCO USE DISORDER/SMOKER-SMOKING CESSATION DISCUSSED 04/10/2008   CERUMEN IMPACTION 04/10/2008   CALLUS, TOE 04/10/2008   Hyperlipidemia 03/14/2007   Depression 03/14/2007   ALLERGIC RHINITIS 03/14/2007   DM2 (diabetes mellitus, type 2) ([place]) 01/11/2007   ANXIETY 01/11/2007   Essential hypertension 01/11/2007   PCP:  [name], MD Pharmacy:   [facility] [address] [place] [address] Phone: [phone] Fax: [phone]     Social Determinants of Health (SDOH) Interventions    Readmission Risk Interventions No flowsheet data found.

## 2021-06-29 NOTE — Progress Notes (Signed)
Subjective: No complaints. EEG completed with official read pending.   Objective: Current vital signs: BP 107/70 (BP Location: Left Arm)    Pulse 86    Temp 97.9 F (36.6 C) (Oral)    Resp 19    SpO2 95%  Vital signs in last 24 hours: Temp:  [97.5 F (36.4 C)-98.1 F (36.7 C)] 97.9 F (36.6 C) (02/22 0312) Pulse Rate:  [60-86] 86 (02/22 0312) Resp:  [12-24] 19 (02/22 0312) BP: (103-141)/(62-91) 107/70 (02/22 0312) SpO2:  [93 %-100 %] 95 % (02/22 0312)  Intake/Output from previous day: 02/21 0701 - 02/22 0700 In: 2425.8 [I.V.:425.8; IV Piggyback:2000] Out: 325 [Urine:325] Intake/Output this shift: No intake/output data recorded. Nutritional status:  Diet Order     None      HEENT: [place]/AT Lungs: Respirations unlabored.   Neurologic Exam: Ment: Awake, alert and oriented x 5. No aphasia or dysarthria.  CN: Fixates and tracks normally. Temporal visual fields intact with no extinction to DSS. Face symmetric. Phonation intact.  Motor: 5/5 x 4 Sensory: Intact to touch x 4 Cerebellar: No ataxia noted.   Lab Results: Results for orders placed or performed during the hospital encounter of 06/28/21 (from the past 48 hour(s))  Basic metabolic panel     Status: Abnormal   Collection Time: 06/28/21  9:15 AM  Result Value Ref Range   Sodium 130 (L) 135 - 145 mmol/L   Potassium 4.8 3.5 - 5.1 mmol/L   Chloride 97 (L) 98 - 111 mmol/L   CO2 23 22 - 32 mmol/L   Glucose, Bld 371 (H) 70 - 99 mg/dL    Comment: Glucose reference range applies only to samples taken after fasting for at least 8 hours.   BUN 21 8 - 23 mg/dL   Creatinine, Ser 1.05 0.61 - 1.24 mg/dL   Calcium 9.1 8.9 - 10.3 mg/dL   GFR, Estimated >60 >60 mL/min    Comment: (NOTE) Calculated using the CKD-EPI Creatinine Equation (2021)    Anion gap 10 5 - 15    Comment: Performed at [facility], [address] [place], [place] [address]  CBC     Status: Abnormal   Collection Time: 06/28/21  9:15 AM  Result  Value Ref Range   WBC 2.2 (L) 4.0 - 10.5 K/uL   RBC 3.40 (L) 4.22 - 5.81 MIL/uL   Hemoglobin 9.6 (L) 13.0 - 17.0 g/dL   HCT 27.4 (L) 39.0 - 52.0 %   MCV 80.6 80.0 - 100.0 fL   MCH 28.2 26.0 - 34.0 pg   MCHC 35.0 30.0 - 36.0 g/dL   RDW 14.2 11.5 - 15.5 %   Platelets 65 (L) 150 - 400 K/uL    Comment: Immature Platelet Fraction may be clinically indicated, consider ordering this additional test [id number] REPEATED TO VERIFY PLATELET COUNT CONFIRMED BY SMEAR PLATELETS APPEAR DECREASED    nRBC 1.8 (H) 0.0 - 0.2 %    Comment: Performed at [facility], [address] [place], [place] [address]  Hepatic function panel     Status: Abnormal   Collection Time: 06/28/21  9:15 AM  Result Value Ref Range   Total Protein 7.1 6.5 - 8.1 g/dL   Albumin 3.4 (L) 3.5 - 5.0 g/dL   AST 23 15 - 41 U/L   ALT 29 0 - 44 U/L   Alkaline Phosphatase 68 38 - 126 U/L   Total Bilirubin 1.3 (H) 0.3 - 1.2 mg/dL   Bilirubin,  Direct 0.3 (H) 0.0 - 0.2 mg/dL   Indirect Bilirubin 1.0 (H) 0.3 - 0.9 mg/dL    Comment: Performed at [facility], [address] [place], [place] [address]  CBC with Differential/Platelet     Status: Abnormal   Collection Time: 06/28/21  9:15 AM  Result Value Ref Range   WBC 2.1 (L) 4.0 - 10.5 K/uL   RBC 3.39 (L) 4.22 - 5.81 MIL/uL   Hemoglobin 9.5 (L) 13.0 - 17.0 g/dL   HCT 27.4 (L) 39.0 - 52.0 %   MCV 80.8 80.0 - 100.0 fL   MCH 28.0 26.0 - 34.0 pg   MCHC 34.7 30.0 - 36.0 g/dL   RDW 14.2 11.5 - 15.5 %   Platelets 53 (L) 150 - 400 K/uL    Comment: REPEATED TO VERIFY PLATELET COUNT CONFIRMED BY SMEAR PLATELETS APPEAR DECREASED    nRBC 2.4 (H) 0.0 - 0.2 %   Neutrophils Relative % 66 %   Neutro Abs 1.4 (L) 1.7 - 7.7 K/uL   Lymphocytes Relative 12 %   Lymphs Abs 0.3 (L) 0.7 - 4.0 K/uL   Monocytes Relative 16 %   Monocytes Absolute 0.3 0.1 - 1.0 K/uL   Eosinophils Relative 1 %   Eosinophils Absolute 0.0 0.0 - 0.5 K/uL   Basophils Relative 2 %   Basophils  Absolute 0.1 0.0 - 0.1 K/uL   WBC Morphology INCREASED BANDS (>20% BANDS)     Comment: TOXIC GRANULATION   Smear Review PLATELETS APPEAR DECREASED    Immature Granulocytes 3 %   Abs Immature Granulocytes 0.06 0.00 - 0.07 K/uL   Polychromasia PRESENT     Comment: Performed at [facility], [address] [place], [place] [address]  CBG monitoring, ED     Status: Abnormal   Collection Time: 06/28/21  9:24 AM  Result Value Ref Range   Glucose-Capillary 338 (H) 70 - 99 mg/dL    Comment: Glucose reference range applies only to samples taken after fasting for at least 8 hours.  Lactic acid, plasma     Status: Abnormal   Collection Time: 06/28/21 10:19 AM  Result Value Ref Range   Lactic Acid, Venous 4.3 (HH) 0.5 - 1.9 mmol/L    Comment: CRITICAL RESULT CALLED TO, READ BACK BY AND VERIFIED WITH: MARVA SIMMS RN ON 06/28/21 AT [address] [facility] Performed at [facility], [address] [place], [place] [address]   Lactic acid, plasma     Status: Abnormal   Collection Time: 06/28/21 12:25 PM  Result Value Ref Range   Lactic Acid, Venous 3.0 (HH) 0.5 - 1.9 mmol/L    Comment: CRITICAL VALUE NOTED.  VALUE IS CONSISTENT WITH PREVIOUSLY REPORTED AND CALLED VALUE. Performed at [facility], [address] [place], [place] [address]   Occult blood card to lab, stool     Status: None   Collection Time: 06/28/21 12:35 PM  Result Value Ref Range   Fecal Occult Bld NEGATIVE NEGATIVE    Comment: Performed at [facility], [address] [place], [place] [address]  Resp Panel by RT-PCR (Flu A&B, Covid) Nasopharyngeal Swab     Status: Abnormal   Collection Time: 06/28/21  2:32 PM   Specimen: Nasopharyngeal Swab; Nasopharyngeal(NP) swabs in vial transport medium  Result Value Ref Range   SARS Coronavirus 2 by RT PCR POSITIVE (A) NEGATIVE    Comment: (NOTE) SARS-CoV-2 target nucleic acids are DETECTED.  The SARS-CoV-2 RNA is generally detectable in upper  respiratory specimens during the acute phase of infection. Positive results are indicative of the presence of the identified virus, but do not rule out bacterial infection or co-infection with other pathogens not detected by the test. Clinical correlation with patient history and other diagnostic information is necessary to determine patient infection status. The expected result is Negative.  Fact Sheet for Patients: EntrepreneurPulse.com.au  Fact Sheet for Healthcare Providers: IncredibleEmployment.be  This test is not yet approved or cleared by the [place] FDA and  has been authorized for detection and/or diagnosis of SARS-CoV-2 by FDA under an Emergency Use Authorization (EUA).  This EUA will remain in effect (meaning this test can be used) for the duration of  the COVID-19 declaration under Section 564(b)(1) of the A ct, 21 U.S.C. section 360bbb-3(b)(1), unless the authorization is terminated or revoked sooner.     Influenza A by PCR NEGATIVE NEGATIVE   Influenza B by PCR NEGATIVE NEGATIVE    Comment: (NOTE) The Xpert Xpress SARS-CoV-2/FLU/RSV plus assay is intended as an aid in the diagnosis of influenza from Nasopharyngeal swab specimens and should not be used as a sole basis for treatment. Nasal washings and aspirates are unacceptable for Xpert Xpress SARS-CoV-2/FLU/RSV testing.  Fact Sheet for Patients: EntrepreneurPulse.com.au  Fact Sheet for Healthcare Providers: IncredibleEmployment.be  This test is not yet approved or cleared by the [place] FDA and has been authorized for detection and/or diagnosis of SARS-CoV-2 by FDA under an Emergency Use Authorization (EUA). This EUA will remain in effect (meaning this test can be used) for the duration of the COVID-19 declaration under Section 564(b)(1) of the Act, 21 U.S.C. section 360bbb-3(b)(1), unless the authorization is terminated  or revoked.  Performed at [facility], [address] [place], [place] [address]   CK     Status: None   Collection Time: 06/28/21  2:47 PM  Result Value Ref Range   Total CK 65 49 - 397 U/L    Comment: Performed at [facility], [address] [place], [place] [address]  Magnesium     Status: Abnormal   Collection Time: 06/28/21  2:47 PM  Result Value Ref Range   Magnesium 1.6 (L) 1.7 - 2.4 mg/dL    Comment: Performed at [facility], [address] [place], [place] [address]  Urinalysis, Routine w reflex microscopic Urine, Clean Catch     Status: Abnormal   Collection Time: 06/28/21  2:56 PM  Result Value Ref Range   Color, Urine YELLOW YELLOW   APPearance CLEAR CLEAR   Specific Gravity, Urine 1.010 1.005 - 1.030   pH 5.0 5.0 - 8.0   Glucose, UA >=500 (A) NEGATIVE mg/dL   Hgb urine dipstick NEGATIVE NEGATIVE   Bilirubin Urine NEGATIVE NEGATIVE   Ketones, ur NEGATIVE NEGATIVE mg/dL   Protein, ur NEGATIVE NEGATIVE mg/dL   Nitrite NEGATIVE NEGATIVE   Leukocytes,Ua NEGATIVE NEGATIVE    Comment: Performed at [facility], [address] [place], [place] [address]  Urinalysis, Microscopic (reflex)     Status: Abnormal   Collection Time: 06/28/21  2:56 PM  Result Value Ref Range   RBC / HPF 0-5 0 - 5 RBC/hpf   WBC, UA 0-5 0 - 5 WBC/hpf   Bacteria, UA RARE (A) NONE SEEN   Squamous Epithelial / LPF 0-5 0 - 5   Budding Yeast PRESENT     Comment: Performed  at [facility], [address] [place], [place] [address]  Glucose, capillary     Status: Abnormal   Collection Time: 06/28/21  5:44 PM  Result Value Ref Range   Glucose-Capillary 281 (H) 70 - 99 mg/dL    Comment: Glucose reference range applies only to samples taken after fasting for at least 8 hours.  TSH     Status: None   Collection Time: 06/28/21  7:12 PM  Result Value Ref Range   TSH 1.774 0.350 - 4.500 uIU/mL    Comment: Performed by a 3rd Generation assay  with a functional sensitivity of <=0.01 uIU/mL. Performed at [facility], [place] [address] [place], [place] [address]   Glucose, capillary     Status: Abnormal   Collection Time: 06/28/21  9:08 PM  Result Value Ref Range   Glucose-Capillary 328 (H) 70 - 99 mg/dL    Comment: Glucose reference range applies only to samples taken after fasting for at least 8 hours.  Troponin I (High Sensitivity)     Status: None   Collection Time: 06/28/21  9:22 PM  Result Value Ref Range   Troponin I (High Sensitivity) 7 <18 ng/L    Comment: (NOTE) Elevated high sensitivity troponin I (hsTnI) values and significant  changes across serial measurements may suggest ACS but many other  chronic and acute conditions are known to elevate hsTnI results.  Refer to the "Links" section for chest pain algorithms and additional  guidance. Performed at [facility], [place] [address], [place], [place] [address]   Basic metabolic panel     Status: Abnormal   Collection Time: 06/29/21 12:43 AM  Result Value Ref Range   Sodium 134 (L) 135 - 145 mmol/L   Potassium 4.5 3.5 - 5.1 mmol/L   Chloride 103 98 - 111 mmol/L   CO2 23 22 - 32 mmol/L   Glucose, Bld 271 (H) 70 - 99 mg/dL    Comment: Glucose reference range applies only to samples taken after fasting for at least 8 hours.   BUN 16 8 - 23 mg/dL   Creatinine, Ser 1.09 0.61 - 1.24 mg/dL   Calcium 8.4 (L) 8.9 - 10.3 mg/dL   GFR, Estimated >60 >60 mL/min    Comment: (NOTE) Calculated using the CKD-EPI Creatinine Equation (2021)    Anion gap 8 5 - 15    Comment: Performed at [facility] [address] [place], [place] [address]  CBC     Status: Abnormal   Collection Time: 06/29/21 12:43 AM  Result Value Ref Range   WBC 2.3 (L) 4.0 - 10.5 K/uL   RBC 2.81 (L) 4.22 - 5.81 MIL/uL   Hemoglobin 8.1 (L) 13.0 - 17.0 g/dL   HCT 22.9 (L) 39.0 - 52.0 %   MCV 81.5 80.0 - 100.0 fL   MCH 28.8 26.0 - 34.0 pg   MCHC 35.4 30.0 - 36.0 g/dL   RDW 14.1 11.5 -  15.5 %   Platelets 55 (L) 150 - 400 K/uL    Comment: Immature Platelet Fraction may be clinically indicated, consider ordering this additional test [id number] REPEATED TO VERIFY PLATELET COUNT CONFIRMED BY SMEAR    nRBC 2.2 (H) 0.0 - 0.2 %    Comment: Performed at [facility], [place] [address], [place], [place] [address]  Troponin I (High Sensitivity)     Status: None   Collection Time: 06/29/21 12:43 AM  Result Value Ref Range  Troponin I (High Sensitivity) 13 <18 ng/L    Comment: (NOTE) Elevated high sensitivity troponin I (hsTnI) values and significant  changes across serial measurements may suggest ACS but many other  chronic and acute conditions are known to elevate hsTnI results.  Refer to the "Links" section for chest pain algorithms and additional  guidance. Performed at [facility], [place] [address] [place], [place] [address]   Glucose, capillary     Status: Abnormal   Collection Time: 06/29/21  6:12 AM  Result Value Ref Range   Glucose-Capillary 254 (H) 70 - 99 mg/dL    Comment: Glucose reference range applies only to samples taken after fasting for at least 8 hours.  Prepare RBC (crossmatch)     Status: None   Collection Time: 06/29/21  6:43 AM  Result Value Ref Range   Order Confirmation      ORDER PROCESSED BY BLOOD BANK Performed at [facility], [place] [address] [place], [place] [address]   Type and screen [place]     Status: None (Preliminary result)   Collection Time: 06/29/21  6:45 AM  Result Value Ref Range   ABO/RH(D) PENDING    Antibody Screen PENDING    Sample Expiration      07/02/2021,2359 Performed at [facility], [place] [address], [place], [place] [address]     Recent Results (from the past 240 hour(s))  Resp Panel by RT-PCR (Flu A&B, Covid) Nasopharyngeal Swab     Status: Abnormal   Collection Time: 06/28/21  2:32 PM   Specimen: Nasopharyngeal Swab; Nasopharyngeal(NP) swabs in vial transport medium   Result Value Ref Range Status   SARS Coronavirus 2 by RT PCR POSITIVE (A) NEGATIVE Final    Comment: (NOTE) SARS-CoV-2 target nucleic acids are DETECTED.  The SARS-CoV-2 RNA is generally detectable in upper respiratory specimens during the acute phase of infection. Positive results are indicative of the presence of the identified virus, but do not rule out bacterial infection or co-infection with other pathogens not detected by the test. Clinical correlation with patient history and other diagnostic information is necessary to determine patient infection status. The expected result is Negative.  Fact Sheet for Patients: EntrepreneurPulse.com.au  Fact Sheet for Healthcare Providers: IncredibleEmployment.be  This test is not yet approved or cleared by the [place] FDA and  has been authorized for detection and/or diagnosis of SARS-CoV-2 by FDA under an Emergency Use Authorization (EUA).  This EUA will remain in effect (meaning this test can be used) for the duration of  the COVID-19 declaration under Section 564(b)(1) of the A ct, 21 U.S.C. section 360bbb-3(b)(1), unless the authorization is terminated or revoked sooner.     Influenza A by PCR NEGATIVE NEGATIVE Final   Influenza B by PCR NEGATIVE NEGATIVE Final    Comment: (NOTE) The Xpert Xpress SARS-CoV-2/FLU/RSV plus assay is intended as an aid in the diagnosis of influenza from Nasopharyngeal swab specimens and should not be used as a sole basis for treatment. Nasal washings and aspirates are unacceptable for Xpert Xpress SARS-CoV-2/FLU/RSV testing.  Fact Sheet for Patients: EntrepreneurPulse.com.au  Fact Sheet for Healthcare Providers: IncredibleEmployment.be  This test is not yet approved or cleared by the [place] FDA and has been authorized for detection and/or diagnosis of SARS-CoV-2 by FDA under an Emergency Use Authorization (EUA).  This EUA will remain in effect (meaning this test can be used) for the duration of the COVID-19 declaration under Section 564(b)(1) of the  Act, 21 U.S.C. section 360bbb-3(b)(1), unless the authorization is terminated or revoked.  Performed at [facility], [address] [place], [place] [address]     Lipid Panel No results for input(s): CHOL, TRIG, HDL, CHOLHDL, VLDL, LDLCALC in the last 72 hours.  Studies/Results: DG Ribs Unilateral W/Chest Left  Result Date: 06/28/2021 CLINICAL DATA:  [place] EXAM: LEFT RIBS AND CHEST - 3+ VIEW COMPARISON:  05/29/2021 FINDINGS: Minimally displaced fracture, lateral aspect left eighth rib. Displaced fracture, anterolateral left ninth rib. Minimally distracted fracture, left tenth rib. No pneumothorax or effusion evident. Surgical clips right axilla. Abdominal aortic stent partially visualized. Previous cement augmentation of T12. IMPRESSION: 1. Left eighth, ninth, tenth rib fractures without effusion or pneumothorax. Electronically Signed   By: Lucrezia Europe M.D.   On: 06/28/2021 10:20   CT Head Wo Contrast  Result Date: 06/28/2021 CLINICAL DATA:  Loss of consciousness this morning on the way to the bathroom, atrial fibrillation, has been on Holter monitor for 2 weeks. History COPD, type II diabetes mellitus, hypertension EXAM: CT HEAD WITHOUT CONTRAST TECHNIQUE: Contiguous axial images were obtained from the base of the skull through the vertex without intravenous contrast. RADIATION DOSE REDUCTION: This exam was performed according to the departmental dose-optimization program which includes automated exposure control, adjustment of the mA and/or kV according to patient size and/or use of iterative reconstruction technique. COMPARISON:  05/29/2021 FINDINGS: Brain: Generalized atrophy. Normal ventricular morphology. No midline shift or mass effect. Normal appearance of brain parenchyma. No intracranial hemorrhage, mass lesion, or evidence of acute  infarction. No extra-axial fluid collections. Vascular: Atherosclerotic calcifications of internal carotid arteries at skull base Skull: Intact Sinuses/Orbits: Clear Other: N/A IMPRESSION: Generalized atrophy. No acute intracranial abnormalities. Electronically Signed   By: Lavonia Dana M.D.   On: 06/28/2021 12:39   MR BRAIN W WO CONTRAST  Result Date: 06/28/2021 CLINICAL DATA:  Seizure EXAM: MRI HEAD WITHOUT AND WITH CONTRAST TECHNIQUE: Multiplanar, multiecho pulse sequences of the brain and surrounding structures were obtained without and with intravenous contrast. CONTRAST:  31mL GADAVIST GADOBUTROL 1 MMOL/ML IV SOLN COMPARISON:  MRI 06/14/2018 correlation is also made with CT head 06/28/2021 FINDINGS: Brain: No restricted diffusion to suggest acute or subacute infarct. No acute hemorrhage, mass, mass effect, or midline shift. No hydrocephalus or extra-axial collection. No abnormal parenchymal or meningeal enhancement. The hippocampi are symmetric in size and normal in signal. No evidence of heterotopia or cortical dysgenesis. Scattered T2 hyperintense signal in the periventricular white matter, likely the sequela of mild chronic small vessel ischemic disease. Vascular: Normal flow voids. Skull and upper cervical spine: Normal marrow signal. Sinuses/Orbits: Small mucous retention cyst in the left maxillary sinus. Otherwise negative. The orbits are unremarkable. Other: Fluid in the right-greater-than-left mastoid air cells. IMPRESSION: No acute intracranial process.  No seizure etiology identified. Electronically Signed   By: Merilyn Baba M.D.   On: 06/28/2021 23:57   CT CHEST ABDOMEN PELVIS W CONTRAST  Result Date: 06/28/2021 CLINICAL DATA:  Loss of consciousness and fall with trauma to chest and left rib pain with abrasions. History of lymphoma. EXAM: CT CHEST, ABDOMEN, AND PELVIS WITH CONTRAST TECHNIQUE: Multidetector CT imaging of the chest, abdomen and pelvis was performed following the standard  protocol during bolus administration of intravenous contrast. RADIATION DOSE REDUCTION: This exam was performed according to the departmental dose-optimization program which includes automated exposure control, adjustment of the mA and/or kV according to patient size and/or use of iterative reconstruction technique. CONTRAST:  123mL  OMNIPAQUE IOHEXOL 300 MG/ML  SOLN COMPARISON:  CT of the abdomen and pelvis on 05/30/2021, CTA of the abdomen and pelvis on 02/25/2021, PET scan on 04/15/2021. FINDINGS: CT CHEST FINDINGS Cardiovascular: Normal caliber thoracic aorta without significant atherosclerosis. Normal caliber central pulmonary arteries. Evidence of coronary atherosclerosis with calcified plaque present. The heart size is normal. No pericardial fluid identified. Mediastinum/Nodes: No enlarged mediastinal, hilar, or axillary lymph nodes. Thyroid gland, trachea, and esophagus demonstrate no significant findings. No evidence of mediastinal hemorrhage. Lungs/Pleura: Stable chronic lung disease with primarily peripheral interstitial thickening and potential early fibrotic disease. There is no evidence of pulmonary edema, consolidation, pneumothorax, nodule or pleural fluid. Musculoskeletal: There are acute nondisplaced fractures involving the left ninth, tenth, eleventh and twelfth ribs. No significant associated chest wall hemorrhage. Old healed fracture of the posterior left ninth rib. 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 CT ABDOMEN PELVIS FINDINGS Hepatobiliary: Stable hepatic steatosis. Normal gallbladder. No biliary ductal dilatation or masses. Pancreas: Unremarkable. No pancreatic ductal dilatation or surrounding inflammatory changes. Spleen: Normal in size without focal abnormality. Adrenals/Urinary Tract: Stable soft tissue fullness in the sinus fat of the left kidney extending towards the collecting system but not  associated with significant hydronephrosis or delay in contrast excretion on the delayed images. Stable 8 mm left adrenal nodule is likely consistent with an adenoma. The bladder is decompressed by a Foley catheter. Stomach/Bowel: Bowel shows no evidence of obstruction, ileus, inflammation or lesion. No free intraperitoneal air. 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 Stable excluded aneurysm sac size of approximately 3.3 x 4.2 cm without evidence of endoleak on delayed images. Endograft limbs remain normally patent. Diminished prominence of left common iliac lymph node measuring 1.5 cm compared to 2.0 cm previously. Reproductive: Prostate is unremarkable. Other: Stable small left inguinal hernia containing fat. No ascites or abnormal fluid collections. Musculoskeletal: Stable chronic compression fracture of T12 with prior vertebral augmentation. IMPRESSION: 1. Acute nondisplaced fractures of the left ninth, tenth, eleventh and twelfth ribs without associated pneumothorax, hemothorax or significant chest wall contusion/hematoma. 2. Stable old fractures of the left ninth and right eighth and ninth ribs. 3. Stable soft tissue fullness in the sinus fat of the left kidney without associated significant hydronephrosis. 4. Stable appearance of the aorta status post EVAR without evidence of endoleak. 5. Decrease in size of a left common iliac lymph node since prior CT on 05/30/2021. Next number stable small left inguinal hernia containing fat. Electronically Signed   By: Aletta Edouard M.D.   On: 06/28/2021 12:49    Medications: Scheduled:  sodium chloride   Intravenous Once   acetaminophen  650 mg Oral Once   allopurinol  100 mg Oral Daily   arformoterol  15 mcg Nebulization BID   aspirin EC  81 mg Oral Daily   atenolol  50 mg Oral Daily   Chlorhexidine Gluconate Cloth  6 each Topical Daily   diphenhydrAMINE  25 mg Oral Once   doxycycline  100 mg Oral BID   DULoxetine  60  mg Oral QHS   famciclovir  250 mg Oral BID   furosemide  20 mg Intravenous Once   furosemide  20 mg Intravenous Once   insulin aspart  0-5 Units Subcutaneous QHS   insulin aspart  0-9 Units Subcutaneous TID WC   insulin glargine-yfgn  20 Units Subcutaneous QHS   lidocaine  1 patch Transdermal  Q24H   pantoprazole  80 mg Oral Daily   potassium chloride SA  20 mEq Oral Daily   rosuvastatin  10 mg Oral Daily   sodium chloride flush  3 mL Intravenous Q12H   tamsulosin  0.4 mg Oral QPM   Continuous:  sodium chloride 75 mL/hr at 06/28/21 1858    Assessment: Aaron Fee Sr. is a 66 y.o. 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 This syncopal episode is very similar to previous ones due to dehydration with the exception of 30 seconds of convulsion, which is new. Patient regularly dehydrated. No acute neurological deficits noted at this time. - MRI brain: No acute intracranial process.  No seizure etiology identified. - EEG: This study is within normal limits. No seizures or epileptiform discharges were seen throughout the recording. - Orthostatic vitals are positive:  - Lying: BP 121/70, HR 74 - Sitting: 92/62, 85 - Immediate standing: 97/62, 85 - Standing, 3 minute delay: 77/28, 94 - Heme/Onc opinion is that the patient's syncopal spell was due to dehydration. Fluid bolus and a blood transfusion have been ordered.   Impression: - DDx: Syncope 2/2 orthostatic hypotension vs vasovagal syncope vs seizure vs metastatic process. Given patient is regularly dehydrated, has had similar episodes from dehydration, and this episode occurred at 3 AM, this syncopal episode is highly likely due to dehydration and orthostatic hypotension. - Most likely etiology  for his presentation is a Syncopal Convulsion, which is a non-epileptic seizure induced by sudden decrease in brain perfusion due to the syncopal episode, leading to brief convulsion, and usually without a significant postictal period.    Recommendations: - Management of fluids and BP as you are doing.  - Syncopal convulsions are managed by identifying and treating the cause of the syncope. No anticonvulsant is indicated at this time.  - No further Neurological recommendations.  - Neurohospitalist service will sign off. Please call if there are additional questions.     LOS: 0 days   @Electronically  signed: Dr. Kerney Elbe 06/29/2021  7:27 AM

## 2021-06-29 NOTE — Consult Note (Signed)
Consultation Note Date: 06/29/2021   Patient Name: Aaron Weaver  DOB: 1955-07-09  MRN: [id number]  Age / Sex: 66 y.o., male  PCP: Aaron Dus, MD Referring Physician: Hosie Poisson, MD  Reason for Consultation: Establishing goals of care  HPI/Patient Profile: 66 y.o. 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   Patient was hospitalized in January with a MRSA bacteremia.  Port-A-Cath was removed at that time.   He had foley placed during that  hospitalization for urinary retention. He was seen at urology last Wednesday and continues with indwelling catheter.   Admitted for treatment and stabilization.  Patient and his family will face ongoing treatment option decisions, advanced directive decisions and anticipatory care needs.    Clinical Assessment and Goals of Care:  This NP Aaron Weaver reviewed medical records, received report from team, assessed the patient and then meet at the patient's bedside along with his wife and son/Aaron Weaver to discuss diagnosis, prognosis and GOCs.     Concept of Palliative Care was introduced as specialized medical care for people and their families living with serious illness.  It focuses on providing relief from the symptoms and stress of a serious illness.  The goal is to improve quality of life for both the patient and the family.   Created space and opportunity for patient  and family to explore thoughts and feelings regarding current medical situation.  Both patient,  his wife and his son understand the seriousness of his current  medical situation however at this time they remain hopeful for improvement and continued quality of life.   The past few months have  been difficult  for all,  things seem to have happened so quickly for them.    Patient is open to all offered and available medical interventions to prolong life.   A  discussion was had today regarding advanced directives.  Concepts specific to code status, artifical feeding and hydration, continued IV antibiotics and rehospitalization was had.     MOST form introduced     Questions and concerns addressed.  Patient  encouraged to call with questions or concerns.     PMT will continue to support holistically, patient and family encouraged to call with questions or concerns into the future.        No documented healthcare power of attorney or living will on file.   Patient's wife/Aaron Weaver is next of kin.  Patient also has a son who lives local  Offered assistance with completion of these documents with supportive spiritual care declined at this time  SUMMARY OF RECOMMENDATIONS      Code Status/Advance Care Planning: Full code   Symptom Management:  Decreased oral intake  Education offered on the importance of adequate hydration.  Tips to increase liquid intake on a daily basis offered. Weakness              Occasional offered on the importance of mobility   Additional Recommendations (Limitations, Scope, Preferences): Full Scope Treatment  Psycho-social/Spiritual:  Desire for further Chaplaincy support:no Additional Recommendations: Education on Hospice   (actually hospice information offered secondary to conversation regarding patient's mother-in-law who currently resides in the SNF and is having continued slow decline)  Prognosis:  Unable to determine  Discharge Planning: Home/self care       Primary Diagnoses: Present on Admission:  Syncope and collapse  COPD (chronic obstructive pulmonary disease) (HCC)   Depression  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 The following aspects are pertinent.  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 ([place]) 05/04/2021   Dysrhythmia    GERD (gastroesophageal reflux disease)    Impacted cerumen    Impotence of organic origin    Lumbago    Lung nodule    Other and unspecified hyperlipidemia    Routine general medical examination at a health care facility    Skipped heart beats    Tobacco use disorder    Type II or unspecified type diabetes mellitus without mention of complication, not stated as uncontrolled    type II   Unspecified essential hypertension    Social History   Socioeconomic History   Marital status: Married    Spouse name: Aaron Weaver   Number of children: 1   Years of education: Not on file   Highest education level: Not on file  Occupational History   Occupation: [employer]  Tobacco Use   Smoking status: Former    Packs/day: 0.50    Years: 53.00    Pack years: 26.50    Types: Cigarettes    Start date: 03/31/1965    Quit date: 07/22/2017    Years since quitting: 3.9   Smokeless tobacco: Never   Tobacco comments:    started at age 66.  Vaping Use   Vaping Use: Never used  Substance and Sexual Activity   Alcohol use: Yes    Comment: rare beer once or twice   Drug use: No   Sexual activity: Yes  Other Topics Concern   Not  on file  Social History Narrative   Not on file   Social Determinants of Health   Financial Resource Strain: Not on file  Food Insecurity: Not on file  Transportation Needs: Not on file  Physical Activity: Not on file  Stress: Not on file  Social Connections: Not on file   Family History  Problem Relation Age of Onset   Colon cancer Mother 31   Hypertension Father    Coronary artery disease Other        male<60 and  male<50  1st degree relative   Colon polyps Brother    Esophageal cancer Neg Hx    Rectal cancer Neg Hx    Stomach cancer Neg Hx    Scheduled Meds:  allopurinol  100 mg Oral Daily   arformoterol  15 mcg Nebulization BID   aspirin EC  81 mg Oral Daily   atenolol  50 mg Oral Daily   Chlorhexidine Gluconate Cloth  6 each Topical Daily   doxycycline  100 mg Oral BID   DULoxetine  60 mg Oral QHS   famciclovir  250 mg Oral BID   furosemide  20 mg Intravenous Once   insulin aspart  0-5 Units Subcutaneous QHS   insulin aspart  0-9 Units Subcutaneous TID WC   insulin glargine-yfgn  20 Units Subcutaneous QHS   lidocaine  1 patch Transdermal Q24H   pantoprazole  80 mg Oral Daily   potassium chloride SA  20 mEq Oral Daily   rosuvastatin  10 mg Oral Daily   sodium chloride flush  3 mL Intravenous Q12H   tamsulosin  0.4 mg Oral QPM   Continuous Infusions:  sodium chloride 10 mL/hr at 06/29/21 0758   PRN Meds:.acetaminophen **OR** acetaminophen, morphine injection, oxyCODONE Medications Prior to Admission:  Prior to Admission medications   Medication Sig Start Date End Date Taking? Authorizing Provider  acetaminophen (TYLENOL) 325 MG tablet Take 650 mg by mouth every 6 (six) hours as needed for mild pain or headache.   Yes [provider]  albuterol (PROAIR HFA) 108 (90 Base) MCG/ACT inhaler Inhale 1-2 puffs into the lungs every 6 (six) hours as needed for wheezing or shortness of breath. 10/15/20  Yes Collene Gobble, MD  alendronate (FOSAMAX) 70 MG tablet  Take 70 mg by mouth every Sunday. 02/12/20  Yes [provider]  allopurinol (ZYLOPRIM) 100 MG tablet Take 1 tablet (100 mg total) by mouth daily. Start 3 days BEFORE chemotherapy starts Patient taking differently: Take 100 mg by mouth daily. 06/09/21  Yes [name], MD  aspirin EC 81 MG EC tablet Take 1 tablet (81 mg total) by mouth daily at 6 (six) AM. Swallow whole. Patient taking differently: Take 81 mg by mouth in the morning. Swallow whole. 07/14/20  Yes Ulyses Amor, PA-C  atenolol (TENORMIN) 100 MG tablet Take 100 mg by mouth daily. 09/02/20  Yes [provider]  calcium carbonate (OSCAL) 1500 (600 Ca) MG TABS tablet Take 600 mg of elemental calcium by mouth daily with breakfast.   Yes [provider]  Continuous Blood Gluc Sensor (FREESTYLE LIBRE 14 DAY SENSOR) MISC Inject 1 Device into the skin every 14 (fourteen) days.   Yes [provider]  diclofenac Sodium (VOLTAREN) 1 % GEL Apply 2 g topically daily as needed (to painful sites).   Yes [provider]  doxycycline (VIBRAMYCIN) 100 MG capsule Take 1 capsule (100 mg total) by mouth 2 (two) times daily for 7 days. Start taking AFTER you complete linezolid (Zyvox) Patient taking differently: Take 100 mg by mouth 2 (two) times daily. 06/22/21 06/29/21 Yes Dwyane Dee, MD  DULoxetine (CYMBALTA) 60 MG capsule Take 60 mg by mouth at bedtime. 04/01/18  Yes [provider]  famciclovir (FAMVIR) 250 MG tablet Take 1 tablet (250 mg total) by mouth 2 (two) times daily. 06/09/21  Yes [name], MD  Fish Oil OIL Take 1,500 mg by mouth daily.   Yes [provider]  insulin glargine (LANTUS) 100 UNIT/ML Solostar  Pen Inject 18 Units into the skin daily. Patient taking differently: Inject 20 Units into the skin at bedtime. 06/08/21  Yes Dwyane Dee, MD  insulin lispro (HUMALOG) 100 UNIT/ML KwikPen Inject 2-15 Units into the skin 4 (four) times daily - after meals and at bedtime.  Glucose 121 - 150: 2 units, Glucose 151 - 200: 3 units, Glucose 201 - 250: 5 units, Glucose 251 - 300: 8 units, Glucose 301 - 350: 11 units, Glucose 351 - 400: 15 units, Glucose > 400 call MD 06/08/21  Yes Dwyane Dee, MD  LORazepam (ATIVAN) 0.5 MG tablet Take 1 tablet (0.5 mg total) by mouth every 8 (eight) hours. Take for nausea prn Patient taking differently: Take 0.5 mg by mouth every 8 (eight) hours as needed for anxiety (or nausea). 05/17/21  Yes [name], MD  naproxen sodium (ALEVE) 220 MG tablet Take 220-440 mg by mouth 2 (two) times daily as needed (for pain or headaches).   Yes [provider]  omeprazole (PRILOSEC) 40 MG capsule Take 40 mg by mouth every evening.   Yes [provider]  ondansetron (ZOFRAN) 8 MG tablet Take 1 tablet (8 mg total) by mouth 2 (two) times daily as needed for refractory nausea / vomiting. Start on day 3 after cyclophosphamide chemotherapy. 05/17/21  Yes Ennever, Rudell Cobb, MD  potassium chloride SA (KLOR-CON M20) 20 MEQ tablet TAKE 1 TABLET (20 MEQ TOTAL) BY MOUTH DAILY.  Patient taking differently: Take 20 mEq by mouth in the morning. 02/13/13  Yes Plotnikov, Evie Lacks, MD  prochlorperazine (COMPAZINE) 10 MG tablet Take 1 tablet (10 mg total) by mouth every 6 (six) hours as needed (Nausea or vomiting). Patient taking differently: Take 10 mg by mouth every 6 (six) hours as needed for vomiting or nausea. 05/17/21  Yes Ennever, Rudell Cobb, MD  rosuvastatin (CRESTOR) 10 MG tablet Take 10 mg by mouth daily. 03/16/20  Yes [provider]  SEREVENT DISKUS 50 MCG/ACT diskus inhaler Inhale 1 puff into the lungs 2 (two) times daily. 05/25/21  Yes [provider]  tadalafil (CIALIS) 5 MG tablet Take 5 mg by mouth in the morning.   Yes [provider]  tamsulosin (FLOMAX) 0.4 MG CAPS capsule Take 0.4 mg by mouth every evening.   Yes [provider]  Zinc 50 MG TABS Take 50 mg by mouth daily.   Yes [provider]   blood glucose meter kit and supplies KIT Dispense based on patient and insurance preference. Use up to four times daily as directed. 06/08/21   Dwyane Dee, MD  Continuous Blood Gluc Receiver (FREESTYLE LIBRE 2 READER) [place]  06/16/21   [provider]  heparin lock flush 100 UNIT/ML SOLN injection Dispense 24 syringes with 2.58m dose (discard remainder). Flush each PICC lumen Monday, Wednesday and Friday. Patient not taking: Reported on 06/28/2021 06/17/21   EVolanda Napoleon MD  Insulin Pen Needle 32G X 4 MM MISC 1 each by Does not apply route 4 (four) times daily - after meals and at bedtime. 06/08/21   GDwyane Dee MD  lidocaine-prilocaine (EMLA) cream Apply to affected area once Patient not taking: Reported on 06/28/2021 05/16/21   EVolanda Napoleon MD  predniSONE (DELTASONE) 20 MG tablet Take 3 tablets (60 mg total) by mouth as directed. For 5 days with treatment Patient not taking: Reported on 06/28/2021 06/17/21   EVolanda Napoleon MD  sodium chloride flush (NS) 0.9 % SOLN Inject 10 mLs into the vein 3 (three)  times a week. Flush each port of PICC Monday, Wednesday and Friday. Patient not taking: Reported on 06/28/2021 06/17/21   [name], MD   Allergies  Allergen Reactions   Fexofenadine Other (See Comments)    "dries me out too much"   Hydrochlorothiazide Other (See Comments)    Cramps   Penicillins Hives   Pravastatin Sodium Other (See Comments)    Aches   Requip [Ropinirole Hcl] Other (See Comments)    Bad dreams   Review of Systems  Neurological:  Positive for weakness.   Physical Exam Cardiovascular:     Rate and Rhythm: Normal rate.  Pulmonary:     Effort: Pulmonary effort is normal.  Skin:    General: Skin is warm and dry.  Neurological:     Mental Status: He is alert.    Vital Signs: BP 108/63    Pulse 88    Temp 97.9 F (36.6 C) (Oral)    Resp 19    SpO2 95%  Pain Scale: 0-10 POSS *See Group Information*: 1-Acceptable,Awake and alert Pain Score: 2     SpO2: SpO2: 95 % O2 Device:SpO2: 95 % O2 Flow Rate: .   IO: Intake/output summary:  Intake/Output Summary (Last 24 hours) at 06/29/2021 1106 Last data filed at 06/29/2021 8006 Gross per 24 hour  Intake 2428.77 ml  Output 975 ml  Net 1453.77 ml    LBM: Last BM Date : 06/27/21 Baseline Weight:   Most recent weight:       Palliative Assessment/Data: 60 %    Discussed with Dr Karleen Hampshire and [facility] team   Time In: 1200 Time Out: 1310 Time Total: 70 minutes Greater than 50%  of this time was spent counseling and coordinating care related to the above assessment and plan.  Signed by: Aaron Lessen, NP   Please contact Palliative Medicine Team phone at [phone] for questions and concerns.  For individual provider: See Shea Evans

## 2021-06-30 DIAGNOSIS — C8338 Diffuse large B-cell lymphoma, lymph nodes of multiple sites: Secondary | ICD-10-CM | POA: Diagnosis not present

## 2021-06-30 DIAGNOSIS — S2242XA Multiple fractures of ribs, left side, initial encounter for closed fracture: Secondary | ICD-10-CM | POA: Diagnosis not present

## 2021-06-30 DIAGNOSIS — I480 Paroxysmal atrial fibrillation: Secondary | ICD-10-CM | POA: Diagnosis not present

## 2021-06-30 DIAGNOSIS — R55 Syncope and collapse: Secondary | ICD-10-CM | POA: Diagnosis not present

## 2021-06-30 DIAGNOSIS — Z8616 Personal history of COVID-19: Secondary | ICD-10-CM

## 2021-06-30 DIAGNOSIS — R7881 Bacteremia: Secondary | ICD-10-CM | POA: Diagnosis not present

## 2021-06-30 DIAGNOSIS — B9562 Methicillin resistant Staphylococcus aureus infection as the cause of diseases classified elsewhere: Secondary | ICD-10-CM | POA: Diagnosis not present

## 2021-06-30 DIAGNOSIS — I10 Essential (primary) hypertension: Secondary | ICD-10-CM | POA: Diagnosis not present

## 2021-06-30 DIAGNOSIS — E860 Dehydration: Secondary | ICD-10-CM | POA: Diagnosis not present

## 2021-06-30 DIAGNOSIS — D61818 Other pancytopenia: Secondary | ICD-10-CM | POA: Diagnosis not present

## 2021-06-30 DIAGNOSIS — N179 Acute kidney failure, unspecified: Secondary | ICD-10-CM | POA: Diagnosis not present

## 2021-06-30 LAB — TYPE AND SCREEN
ABO/RH(D): B POS
Antibody Screen: NEGATIVE
Unit division: 0
Unit division: 0

## 2021-06-30 LAB — CBC WITH DIFFERENTIAL/PLATELET
Abs Immature Granulocytes: 0.16 10*3/uL — ABNORMAL HIGH (ref 0.00–0.07)
Basophils Absolute: 0 10*3/uL (ref 0.0–0.1)
Basophils Relative: 1 %
Eosinophils Absolute: 0 10*3/uL (ref 0.0–0.5)
Eosinophils Relative: 0 %
HCT: 30.3 % — ABNORMAL LOW (ref 39.0–52.0)
Hemoglobin: 10.8 g/dL — ABNORMAL LOW (ref 13.0–17.0)
Immature Granulocytes: 5 %
Lymphocytes Relative: 9 %
Lymphs Abs: 0.3 10*3/uL — ABNORMAL LOW (ref 0.7–4.0)
MCH: 29.1 pg (ref 26.0–34.0)
MCHC: 35.6 g/dL (ref 30.0–36.0)
MCV: 81.7 fL (ref 80.0–100.0)
Monocytes Absolute: 0.5 10*3/uL (ref 0.1–1.0)
Monocytes Relative: 15 %
Neutro Abs: 2.2 10*3/uL (ref 1.7–7.7)
Neutrophils Relative %: 70 %
Platelets: 70 10*3/uL — ABNORMAL LOW (ref 150–400)
RBC: 3.71 MIL/uL — ABNORMAL LOW (ref 4.22–5.81)
RDW: 14.6 % (ref 11.5–15.5)
Smear Review: DECREASED
WBC: 3.2 10*3/uL — ABNORMAL LOW (ref 4.0–10.5)
nRBC: 1.6 % — ABNORMAL HIGH (ref 0.0–0.2)

## 2021-06-30 LAB — BPAM RBC
Blood Product Expiration Date: 202302262359
Blood Product Expiration Date: 202303012359
ISSUE DATE / TIME: 202302220933
ISSUE DATE / TIME: 202302221330
Unit Type and Rh: 1700
Unit Type and Rh: 1700

## 2021-06-30 LAB — COMPREHENSIVE METABOLIC PANEL
ALT: 22 U/L (ref 0–44)
AST: 19 U/L (ref 15–41)
Albumin: 2.9 g/dL — ABNORMAL LOW (ref 3.5–5.0)
Alkaline Phosphatase: 70 U/L (ref 38–126)
Anion gap: 8 (ref 5–15)
BUN: 18 mg/dL (ref 8–23)
CO2: 25 mmol/L (ref 22–32)
Calcium: 8.1 mg/dL — ABNORMAL LOW (ref 8.9–10.3)
Chloride: 98 mmol/L (ref 98–111)
Creatinine, Ser: 0.96 mg/dL (ref 0.61–1.24)
GFR, Estimated: >60 mL/min (ref >60)
Glucose, Bld: 325 mg/dL — ABNORMAL HIGH (ref 70–99)
Potassium: 4.7 mmol/L (ref 3.5–5.1)
Sodium: 131 mmol/L — ABNORMAL LOW (ref 135–145)
Total Bilirubin: 1.3 mg/dL — ABNORMAL HIGH (ref 0.3–1.2)
Total Protein: 5.9 g/dL — ABNORMAL LOW (ref 6.5–8.1)

## 2021-06-30 LAB — GLUCOSE, CAPILLARY: Glucose-Capillary: 280 mg/dL — ABNORMAL HIGH (ref 70–99)

## 2021-06-30 MED ORDER — INSULIN GLARGINE-YFGN 100 UNIT/ML ~~LOC~~ SOLN
28.0000 [IU] | Freq: Every day | SUBCUTANEOUS | Status: DC
  Filled 2021-06-30: qty 0.28

## 2021-06-30 MED ORDER — INSULIN ASPART 100 UNIT/ML IJ SOLN
5.0000 [IU] | Freq: Three times a day (TID) | INTRAMUSCULAR | Status: DC
Start: 2021-06-30 — End: 2021-06-30

## 2021-06-30 MED ORDER — INSULIN GLARGINE 100 UNIT/ML SOLOSTAR PEN: 25.0000 [IU] | PEN_INJECTOR | Freq: Every day | SUBCUTANEOUS | 11 refills | Status: AC

## 2021-06-30 NOTE — TOC CAGE-AID Note (Signed)
Transition of Care (TOC) - CAGE-AID Screening   Patient Details  Name: Aaron BALESTRIERI Sr. MRN: [id number] Date of Birth: [birth year]  Transition of Care [facility]) CM/SW Contact:    [name], LCSWA Phone Number: 06/30/2021, 2:24 PM   Clinical Narrative: Pt participated in [place].  Pt stated he does not use substance or ETOH.  Pt was not offered resources, due to no usage of substance or ETOH.     [name], MSW, LCSW-A Pronouns:  She/Her/Hers [place] Transitions of Care Clinical Social Worker Direct Number:  [phone] [name]   CAGE-AID Screening: Substance Abuse Screening unable to be completed due to: : Patient unable to participate  Have You Ever Felt You Ought to Cut Down on Your Drinking or Drug Use?: No Have People Annoyed You By Critizing Your Drinking Or Drug Use?: No Have You Felt Bad Or Guilty About Your Drinking Or Drug Use?: No Have You Ever Had a Drink or Used Drugs First Thing In The Morning to Steady Your Nerves or to Get Rid of a Hangover?: No CAGE-AID Score: 0  Substance Abuse Education Offered: No

## 2021-06-30 NOTE — Discharge Summary (Signed)
Physician Discharge Summary   Patient: Aaron ANSELL Sr. MRN: [id number] DOB: 11/17/1955  Admit date:     06/28/2021  Discharge date: 06/30/2021  Discharge Physician: Hosie Poisson   PCP: [name], MD   Recommendations at discharge:  PLEASE FOLLOW UP PCP IN ONE WEEK PLEASE FOLLOW UP WITH CBC AND BMP Please follow up with oncology as scheduled.   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 *   Hospital Course: Aaron STANDRE Sr. is a 66 y.o. 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 This syncopal episode is very similar to previous ones due to dehydration with the exception of 30 seconds of convulsion, which is new.no new deficits.  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  EEG is negative for seizures.  Neurology consulted and recommendations given.  No further work up needed at this time.  Therapy eval recommended.  Orthostatic vital signs repeated and wnl.  Recent echocardiogram reviewed. CT of the head without any acute findings. Overnight telemetry does not show any arrhythmias.  MRI brain is negative for acute intra cranial pathology.   Paroxysmal atrial fibrillation ([place])- (present on admission) In setting of acute illness in 05/2021 Has seen cardiology and zio patch currently being done  CHA2Ds2-Vasc is 4 No AC at this time, continue telemetry and ASA   MRSA bacteremia- (present on admission) TEE completed 1/30 with no vegetations seen - completed the course of antibiotics.   Acute urinary retention- (present on admission) [place] etiology during last hospital stay Followed up with urology this past week with trial foley removal on 2/15 and had to return for foley placement Continue foley care here with f/u urology   Pancytopenia [facility])- (present on admission) Currently in chemo with last infusion 2/13.  ANC: 4158, mild neutropenia  No fever or signs of infection. 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 Marin Olp and is on chemotherapy. First cycle started 1/13, last cycle: 2/10  and he received G-CSF on 1/6 -Continue Famvir for shingles prophylaxis -Bone marrow biopsy 2/1 negative for lymphoma  -Dr.  Marin Olp has been consulted   COPD (chronic obstructive pulmonary disease) ([place])- (present on admission) No signs of acute excerbation Continue serevent and albuterol prn   AAA (abdominal aortic aneurysm) without rupture- (present on admission) Hx of repair.   Essential hypertension- (present on admission) Has been well controlled Decreasing his tenormin to 12m with orthostasis   Depression- (present on admission) Continue cymbalta   DM2 (diabetes mellitus, type 2) [place] a1c in 05/2021: 9.1 Continue long acting insulin and SSI accuchecks per protocol         Consultants: palliative care Neurology.  oncology Procedures performed: EEG CT head.   Disposition: Home Diet recommendation:  Discharge Diet Orders (From admission, onward)     Start     Ordered   06/30/21 0000  Diet - low sodium heart healthy        06/30/21 0930           Regular diet  DISCHARGE MEDICATION: Allergies as of 06/30/2021       Reactions   Fexofenadine Other (See Comments)   "dries me out too much"   Hydrochlorothiazide Other (See Comments)   Cramps   Penicillins Hives   Pravastatin Sodium Other (See Comments)   Aches   Requip [ropinirole Hcl] Other (See Comments)   Bad dreams        Medication List     STOP taking these medications    BD PosiFlush 0.9 % Soln injection Generic drug: sodium chloride flush   doxycycline 100 MG capsule Commonly known as: VIBRAMYCIN   heparin lock flush 100 UNIT/ML Soln injection   lidocaine-prilocaine cream Commonly known as: EMLA   naproxen sodium 220 MG tablet Commonly known as: ALEVE   predniSONE 20 MG tablet Commonly known as: DELTASONE       TAKE these medications    acetaminophen 325 MG tablet Commonly known as: TYLENOL Take 650 mg by mouth every 6 (six) hours as needed for mild pain or headache.   albuterol 108 (90 Base) MCG/ACT inhaler Commonly known as: ProAir HFA Inhale 1-2 puffs into the lungs every 6 (six) hours  as needed for wheezing or shortness of breath.   alendronate 70 MG tablet Commonly known as: FOSAMAX Take 70 mg by mouth every Sunday.   allopurinol 100 MG tablet Commonly known as: Zyloprim Take 1 tablet (100 mg total) by mouth daily. Start 3 days BEFORE chemotherapy starts What changed: additional instructions   aspirin 81 MG EC tablet Take 1 tablet (81 mg total) by mouth daily at 6 (six) AM. Swallow whole. What changed: when to take this   atenolol 100 MG tablet Commonly known as: TENORMIN Take 100 mg by mouth daily.   blood glucose meter kit and supplies Kit Dispense based on patient and insurance preference. Use up to four times daily as directed.   calcium carbonate 1500 (600 Ca) MG Tabs tablet Commonly known as: OSCAL Take 600 mg of elemental calcium by mouth daily with breakfast.   diclofenac Sodium 1 % Gel Commonly known as: VOLTAREN Apply 2 g topically daily as needed (to painful sites).   DULoxetine 60 MG capsule Commonly known as: CYMBALTA Take 60 mg by mouth at bedtime.   famciclovir 250 MG tablet Commonly known as: FAMVIR Take 1 tablet (  250 mg total) by mouth 2 (two) times daily.   Fish Oil Oil Take 1,500 mg by mouth daily.   FreeStyle Libre 14 Day Sensor Misc Inject 1 Device into the skin every 14 (fourteen) days.   FreeStyle Libre 2 Reader Kerrin Mo   insulin glargine 100 UNIT/ML Solostar Pen Commonly known as: LANTUS Inject 25 Units into the skin daily. What changed: how much to take   insulin lispro 100 UNIT/ML KwikPen Commonly known as: HUMALOG Inject 2-15 Units into the skin 4 (four) times daily - after meals and at bedtime. Glucose 121 - 150: 2 units, Glucose 151 - 200: 3 units, Glucose 201 - 250: 5 units, Glucose 251 - 300: 8 units, Glucose 301 - 350: 11 units, Glucose 351 - 400: 15 units, Glucose > 400 call MD   Insulin Pen Needle 32G X 4 MM Misc 1 each by Does not apply route 4 (four) times daily - after meals and at bedtime.   LORazepam  0.5 MG tablet Commonly known as: ATIVAN Take 1 tablet (0.5 mg total) by mouth every 8 (eight) hours. Take for nausea prn What changed:  when to take this reasons to take this additional instructions   omeprazole 40 MG capsule Commonly known as: PRILOSEC Take 40 mg by mouth every evening.   ondansetron 8 MG tablet Commonly known as: Zofran Take 1 tablet (8 mg total) by mouth 2 (two) times daily as needed for refractory nausea / vomiting. Start on day 3 after cyclophosphamide chemotherapy.   potassium chloride SA 20 MEQ tablet Commonly known as: Klor-Con M20 TAKE 1 TABLET (20 MEQ TOTAL) BY MOUTH DAILY. What changed: when to take this   prochlorperazine 10 MG tablet Commonly known as: COMPAZINE Take 1 tablet (10 mg total) by mouth every 6 (six) hours as needed (Nausea or vomiting). What changed: reasons to take this   rosuvastatin 10 MG tablet Commonly known as: CRESTOR Take 10 mg by mouth daily.   Serevent Diskus 50 MCG/ACT diskus inhaler Generic drug: salmeterol Inhale 1 puff into the lungs 2 (two) times daily.   tadalafil 5 MG tablet Commonly known as: CIALIS Take 5 mg by mouth in the morning.   tamsulosin 0.4 MG Caps capsule Commonly known as: FLOMAX Take 0.4 mg by mouth every evening.   Zinc 50 MG Tabs Take 50 mg by mouth daily.         Discharge Exam: Filed Weights   06/30/21 0500  Weight: 81 kg   General exam: Appears calm and comfortable  Respiratory system: Clear to auscultation. Respiratory effort normal. Cardiovascular system: S1 & S2 heard, RRR. No JVD, murmurs, rubs, gallops or clicks. No pedal edema. Gastrointestinal system: Abdomen is nondistended, soft and nontender. No organomegaly or masses felt. Normal bowel sounds heard. Central nervous system: Alert and oriented. No focal neurological deficits. Extremities: Symmetric 5 x 5 power. Skin: No rashes, lesions or ulcers Psychiatry: Judgement and insight appear normal. Mood & affect  appropriate.    Condition at discharge: fair  The results of significant diagnostics from this hospitalization (including imaging, microbiology, ancillary and laboratory) are listed below for reference.   Imaging Studies: DG Ribs Unilateral W/Chest Left  Result Date: 06/28/2021 CLINICAL DATA:  [place] EXAM: LEFT RIBS AND CHEST - 3+ VIEW COMPARISON:  05/29/2021 FINDINGS: Minimally displaced fracture, lateral aspect left eighth rib. Displaced fracture, anterolateral left ninth rib. Minimally distracted fracture, left tenth rib. No pneumothorax or effusion evident. Surgical clips right axilla. Abdominal aortic stent partially visualized. Previous cement  augmentation of T12. IMPRESSION: 1. Left eighth, ninth, tenth rib fractures without effusion or pneumothorax. Electronically Signed   By: Lucrezia Europe M.D.   On: 06/28/2021 10:20   CT Head Wo Contrast  Result Date: 06/28/2021 CLINICAL DATA:  Loss of consciousness this morning on the way to the bathroom, atrial fibrillation, has been on Holter monitor for 2 weeks. History COPD, type II diabetes mellitus, hypertension EXAM: CT HEAD WITHOUT CONTRAST TECHNIQUE: Contiguous axial images were obtained from the base of the skull through the vertex without intravenous contrast. RADIATION DOSE REDUCTION: This exam was performed according to the departmental dose-optimization program which includes automated exposure control, adjustment of the mA and/or kV according to patient size and/or use of iterative reconstruction technique. COMPARISON:  05/29/2021 FINDINGS: Brain: Generalized atrophy. Normal ventricular morphology. No midline shift or mass effect. Normal appearance of brain parenchyma. No intracranial hemorrhage, mass lesion, or evidence of acute infarction. No extra-axial fluid collections. Vascular: Atherosclerotic calcifications of internal carotid arteries at skull base Skull: Intact Sinuses/Orbits: Clear Other: N/A IMPRESSION: Generalized atrophy. No acute  intracranial abnormalities. Electronically Signed   By: Lavonia Dana M.D.   On: 06/28/2021 12:39   MR BRAIN W WO CONTRAST  Result Date: 06/28/2021 CLINICAL DATA:  Seizure EXAM: MRI HEAD WITHOUT AND WITH CONTRAST TECHNIQUE: Multiplanar, multiecho pulse sequences of the brain and surrounding structures were obtained without and with intravenous contrast. CONTRAST:  53m GADAVIST GADOBUTROL 1 MMOL/ML IV SOLN COMPARISON:  MRI 06/14/2018 correlation is also made with CT head 06/28/2021 FINDINGS: Brain: No restricted diffusion to suggest acute or subacute infarct. No acute hemorrhage, mass, mass effect, or midline shift. No hydrocephalus or extra-axial collection. No abnormal parenchymal or meningeal enhancement. The hippocampi are symmetric in size and normal in signal. No evidence of heterotopia or cortical dysgenesis. Scattered T2 hyperintense signal in the periventricular white matter, likely the sequela of mild chronic small vessel ischemic disease. Vascular: Normal flow voids. Skull and upper cervical spine: Normal marrow signal. Sinuses/Orbits: Small mucous retention cyst in the left maxillary sinus. Otherwise negative. The orbits are unremarkable. Other: Fluid in the right-greater-than-left mastoid air cells. IMPRESSION: No acute intracranial process.  No seizure etiology identified. Electronically Signed   By: AMerilyn BabaM.D.   On: 06/28/2021 23:57   CT CHEST ABDOMEN PELVIS W CONTRAST  Result Date: 06/28/2021 CLINICAL DATA:  Loss of consciousness and fall with trauma to chest and left rib pain with abrasions. History of lymphoma. EXAM: CT CHEST, ABDOMEN, AND PELVIS WITH CONTRAST TECHNIQUE: Multidetector CT imaging of the chest, abdomen and pelvis was performed following the standard protocol during bolus administration of intravenous contrast. RADIATION DOSE REDUCTION: This exam was performed according to the departmental dose-optimization program which includes automated exposure control, adjustment of  the mA and/or kV according to patient size and/or use of iterative reconstruction technique. CONTRAST:  1018mOMNIPAQUE IOHEXOL 300 MG/ML  SOLN COMPARISON:  CT of the abdomen and pelvis on 05/30/2021, CTA of the abdomen and pelvis on 02/25/2021, PET scan on 04/15/2021. FINDINGS: CT CHEST FINDINGS Cardiovascular: Normal caliber thoracic aorta without significant atherosclerosis. Normal caliber central pulmonary arteries. Evidence of coronary atherosclerosis with calcified plaque present. The heart size is normal. No pericardial fluid identified. Mediastinum/Nodes: No enlarged mediastinal, hilar, or axillary lymph nodes. Thyroid gland, trachea, and esophagus demonstrate no significant findings. No evidence of mediastinal hemorrhage. Lungs/Pleura: Stable chronic lung disease with primarily peripheral interstitial thickening and potential early fibrotic disease. There is no evidence of pulmonary edema, consolidation, pneumothorax, nodule or  pleural fluid. Musculoskeletal: There are acute nondisplaced fractures involving the left ninth, tenth, eleventh and twelfth ribs. No significant associated chest wall hemorrhage. Old healed fracture of the posterior left ninth rib. 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 CT ABDOMEN PELVIS FINDINGS Hepatobiliary: Stable hepatic steatosis. Normal gallbladder. No biliary ductal dilatation or masses. Pancreas: Unremarkable. No pancreatic ductal dilatation or surrounding inflammatory changes. Spleen: Normal in size without focal abnormality. Adrenals/Urinary Tract: Stable soft tissue fullness in the sinus fat of the left kidney extending towards the collecting system but not associated with significant hydronephrosis or delay in contrast excretion on the delayed images. Stable 8 mm left adrenal nodule is likely consistent with an adenoma. The bladder is decompressed by a Foley catheter. Stomach/Bowel:  Bowel shows no evidence of obstruction, ileus, inflammation or lesion. No free intraperitoneal air. 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 Stable excluded aneurysm sac size of approximately 3.3 x 4.2 cm without evidence of endoleak on delayed images. Endograft limbs remain normally patent. Diminished prominence of left common iliac lymph node measuring 1.5 cm compared to 2.0 cm previously. Reproductive: Prostate is unremarkable. Other: Stable small left inguinal hernia containing fat. No ascites or abnormal fluid collections. Musculoskeletal: Stable chronic compression fracture of T12 with prior vertebral augmentation. IMPRESSION: 1. Acute nondisplaced fractures of the left ninth, tenth, eleventh and twelfth ribs without associated pneumothorax, hemothorax or significant chest wall contusion/hematoma. 2. Stable old fractures of the left ninth and right eighth and ninth ribs. 3. Stable soft tissue fullness in the sinus fat of the left kidney without associated significant hydronephrosis. 4. Stable appearance of the aorta status post EVAR without evidence of endoleak. 5. Decrease in size of a left common iliac lymph node since prior CT on 05/30/2021. Next number stable small left inguinal hernia containing fat. Electronically Signed   By: Aletta Edouard M.D.   On: 06/28/2021 12:49   IR Fluoro Guide CV Line Right  Result Date: 06/16/2021 INDICATION: Patient with history of diffuse large B-cell lymphoma; central venous access requested for chemotherapy EXAM: ULTRASOUND AND FLUOROSCOPIC GUIDED PICC LINE INSERTION MEDICATIONS: 5 mL 1% lidocaine ANESTHESIA/SEDATION: None FLUOROSCOPY TIME:  Fluoroscopy Time: 42 seconds (1 mGy). COMPLICATIONS: None immediate. TECHNIQUE: The procedure, risks, benefits, and alternatives were explained to the patient and informed written consent was obtained. A timeout was performed prior to the initiation of the procedure. The LEFT upper extremity was  prepped with chlorhexidine in a sterile fashion, and a sterile drape was applied covering the operative field. Maximum barrier sterile technique with sterile gowns and gloves were used for the procedure. A timeout was performed prior to the initiation of the procedure. Local anesthesia was provided with 1% lidocaine. Under direct ultrasound guidance, the LEFT basilic vein was accessed with a micropuncture kit after the overlying soft tissues were anesthetized with 1% lidocaine. An ultrasound image was saved for documentation purposes. A guidewire was advanced to the level of the superior caval-atrial junction for measurement purposes and the PICC line was cut to length. A peel-away sheath was placed and a 43 cm, 5 [place], dual lumen was inserted to level of the superior caval-atrial junction. A post procedure spot fluoroscopic was obtained. The catheter easily aspirated and flushed and was sutured in place. A dressing was placed. The patient tolerated the procedure well without immediate post procedural complication. FINDINGS: After catheter placement, the tip lies within the superior  cavoatrial junction; the catheter aspirates and flushes normally and is ready for immediate use. IMPRESSION: Successful placement of a LEFT basilic vein approach, 43 cm, 5 French, dual lumen PICC with tip at the superior caval-atrial junction. The PICC line is ready for immediate use. Read by: Rowe Robert, PA-C Electronically Signed   By: Michaelle Birks M.D.   On: 06/16/2021 16:09   IR US Guide Vasc Access Left  Result Date: 06/16/2021 INDICATION: Patient with history of diffuse large B-cell lymphoma; central venous access requested for chemotherapy EXAM: ULTRASOUND AND FLUOROSCOPIC GUIDED PICC LINE INSERTION MEDICATIONS: 5 mL 1% lidocaine ANESTHESIA/SEDATION: None FLUOROSCOPY TIME:  Fluoroscopy Time: 42 seconds (1 mGy). COMPLICATIONS: None immediate. TECHNIQUE: The procedure, risks, benefits, and alternatives were explained to the  patient and informed written consent was obtained. A timeout was performed prior to the initiation of the procedure. The LEFT upper extremity was prepped with chlorhexidine in a sterile fashion, and a sterile drape was applied covering the operative field. Maximum barrier sterile technique with sterile gowns and gloves were used for the procedure. A timeout was performed prior to the initiation of the procedure. Local anesthesia was provided with 1% lidocaine. Under direct ultrasound guidance, the LEFT basilic vein was accessed with a micropuncture kit after the overlying soft tissues were anesthetized with 1% lidocaine. An ultrasound image was saved for documentation purposes. A guidewire was advanced to the level of the superior caval-atrial junction for measurement purposes and the PICC line was cut to length. A peel-away sheath was placed and a 43 cm, 5 [place], dual lumen was inserted to level of the superior caval-atrial junction. A post procedure spot fluoroscopic was obtained. The catheter easily aspirated and flushed and was sutured in place. A dressing was placed. The patient tolerated the procedure well without immediate post procedural complication. FINDINGS: After catheter placement, the tip lies within the superior cavoatrial junction; the catheter aspirates and flushes normally and is ready for immediate use. IMPRESSION: Successful placement of a LEFT basilic vein approach, 43 cm, 5 French, dual lumen PICC with tip at the superior caval-atrial junction. The PICC line is ready for immediate use. Read by: Rowe Robert, PA-C Electronically Signed   By: Michaelle Birks M.D.   On: 06/16/2021 16:09   CT BIOPSY  Result Date: 06/08/2021 INDICATION: Remote history of B-cell lymphoma, pancytopenia EXAM: CT GUIDED RIGHT ILIAC BONE MARROW ASPIRATION AND CORE BIOPSY Date:  06/08/2021 06/08/2021 1:04 pm Radiologist:  Jerilynn Mages. Daryll Brod, MD Guidance:  CT FLUOROSCOPY TIME:  Fluoroscopy Time: None. MEDICATIONS: 1% lidocaine  local ANESTHESIA/SEDATION: 2.0 mg IV Versed; 100 mcg IV Fentanyl Moderate Sedation Time:  10 minute The patient was continuously monitored during the procedure by the interventional radiology nurse under my direct supervision. CONTRAST:  None. COMPLICATIONS: None PROCEDURE: Informed consent was obtained from the patient following explanation of the procedure, risks, benefits and alternatives. The patient understands, agrees and consents for the procedure. All questions were addressed. A time out was performed. The patient was positioned prone and non-contrast localization CT was performed of the pelvis to demonstrate the iliac marrow spaces. Maximal barrier sterile technique utilized including caps, mask, sterile gowns, sterile gloves, large sterile drape, hand hygiene, and Betadine prep. Under sterile conditions and local anesthesia, an 11 gauge coaxial bone biopsy needle was advanced into the right iliac marrow space. Needle position was confirmed with CT imaging. Initially, bone marrow aspiration was performed. Next, the 11 gauge outer cannula was utilized to obtain a right iliac bone marrow core  biopsy. Needle was removed. Hemostasis was obtained with compression. The patient tolerated the procedure well. Samples were prepared with the cytotechnologist. No immediate complications. IMPRESSION: CT guided right iliac bone marrow aspiration and core biopsy. Electronically Signed   By: Jerilynn Mages.  Shick M.D.   On: 06/08/2021 13:21   EEG adult  Result Date: 06/29/2021 Lora Havens, MD     06/29/2021 10:06 AM Patient Name: Aaron Fee Sr. MRN: [id number] Epilepsy Attending: Lora Havens Referring Physician/Provider: Kerney Elbe, MD Date: 06/28/2021 Duration: 22.13 mins Patient history: 66 year old male presented with syncope and about 30 seconds open wound..  EEG evaluate for seizure Level of alertness: Awake AEDs during EEG study: None Technical aspects: This EEG study was done with scalp electrodes positioned  according to the 10-20 International system of electrode placement. Electrical activity was acquired at a sampling rate of '500Hz'  and reviewed with a high frequency filter of '70Hz'  and a low frequency filter of '1Hz' . EEG data were recorded continuously and digitally stored. Description: The posterior dominant rhythm consists of 8 Hz activity of moderate voltage (25-35 uV) seen predominantly in posterior head regions, symmetric and reactive to eye opening and eye closing. Hyperventilation and photic stimulation were not performed.   IMPRESSION: This study is within normal limits. No seizures or epileptiform discharges were seen throughout the recording. Priyanka Barbra Sarks   CT BONE MARROW BIOPSY & ASPIRATION  Result Date: 06/08/2021 INDICATION: Remote history of B-cell lymphoma, pancytopenia EXAM: CT GUIDED RIGHT ILIAC BONE MARROW ASPIRATION AND CORE BIOPSY Date:  06/08/2021 06/08/2021 1:04 pm Radiologist:  Jerilynn Mages. Daryll Brod, MD Guidance:  CT FLUOROSCOPY TIME:  Fluoroscopy Time: None. MEDICATIONS: 1% lidocaine local ANESTHESIA/SEDATION: 2.0 mg IV Versed; 100 mcg IV Fentanyl Moderate Sedation Time:  10 minute The patient was continuously monitored during the procedure by the interventional radiology nurse under my direct supervision. CONTRAST:  None. COMPLICATIONS: None PROCEDURE: Informed consent was obtained from the patient following explanation of the procedure, risks, benefits and alternatives. The patient understands, agrees and consents for the procedure. All questions were addressed. A time out was performed. The patient was positioned prone and non-contrast localization CT was performed of the pelvis to demonstrate the iliac marrow spaces. Maximal barrier sterile technique utilized including caps, mask, sterile gowns, sterile gloves, large sterile drape, hand hygiene, and Betadine prep. Under sterile conditions and local anesthesia, an 11 gauge coaxial bone biopsy needle was advanced into the right iliac marrow  space. Needle position was confirmed with CT imaging. Initially, bone marrow aspiration was performed. Next, the 11 gauge outer cannula was utilized to obtain a right iliac bone marrow core biopsy. Needle was removed. Hemostasis was obtained with compression. The patient tolerated the procedure well. Samples were prepared with the cytotechnologist. No immediate complications. IMPRESSION: CT guided right iliac bone marrow aspiration and core biopsy. Electronically Signed   By: Jerilynn Mages.  Shick M.D.   On: 06/08/2021 13:21   [place] AXILLA RIGHT  Result Date: 06/01/2021 CLINICAL DATA:  Right axillary abscess. EXAM: ULTRASOUND OF THE RIGHT AXILLA COMPARISON:  Chest CTA dated 05/29/2021. FINDINGS: Ultrasound is performed, showing an irregular fluid collection in the right axilla measuring 8.4 x 5.9 x 1.9 cm. This contains some thin internal septations. This also has some internal solid appearing echogenic components or invaginations of fat. IMPRESSION: 8.4 x 5.9 x 1.9 cm irregular, mildly complicated fluid collection in the right axilla. This is compatible with the patient's known abscess. Electronically Signed   By: Percell Locus.D.  On: 06/01/2021 10:13   ECHO TEE  Result Date: 06/06/2021    TRANSESOPHOGEAL ECHO REPORT   Patient Name:   Aaron HEIDENREICH Sr. Date of Exam: 06/06/2021 Medical Rec #:  [id number]          Height:       72.0 in Accession #:    [id number]         Weight:       177.5 lb Date of Birth:  07-05-1955         BSA:          2.025 m Patient Age:    66 years           BP:           123/87 mmHg Patient Gender: M                  HR:           79 bpm. Exam Location:  Inpatient Procedure: Transesophageal Echo and Color Doppler Indications:     Bacteremia  History:         Patient has prior history of Echocardiogram examinations, most                  recent 05/31/2021. Abnormal ECG, COPD, Arrythmias:Atrial                  Fibrillation, Signs/Symptoms:Syncope and Bacteremia; Risk                   Factors:Hypertension, Diabetes, Dyslipidemia and Current                  Smoker. Covid positive.  Sonographer:     Roseanna Rainbow RDCS Referring Phys:  [place] Diagnosing Phys: Kirk Ruths MD  Sonographer Comments: Technically difficult study due to poor echo windows. PROCEDURE: After discussion of the risks and benefits of a TEE, an informed consent was obtained from the patient. The transesophogeal probe was passed without difficulty through the esophogus of the patient. Imaged were obtained with the patient in a left lateral decubitus position. Sedation performed by different physician. The patient was monitored while under deep sedation. Anesthestetic sedation was provided intravenously by Anesthesiology: 170m of Propofol, 66mof Lidocaine. The patient's vital signs; including heart rate, blood pressure, and oxygen saturation; remained stable throughout the procedure. The patient developed no complications during the procedure. IMPRESSIONS  1. No vegetations; moderately dilated aortic root; suggest CTA or MRA to further assess.  2. Left ventricular ejection fraction, by estimation, is 55 to 60%. The left ventricle has normal function. The left ventricle has no regional wall motion abnormalities.  3. Right ventricular systolic function is normal. The right ventricular size is normal.  4. No left atrial/left atrial appendage thrombus was detected.  5. The mitral valve is normal in structure. Trivial mitral valve regurgitation.  6. The aortic valve is tricuspid. Aortic valve regurgitation is trivial.  7. Aortic dilatation noted. There is moderate dilatation of the aortic root, measuring 46 mm. FINDINGS  Left Ventricle: Left ventricular ejection fraction, by estimation, is 55 to 60%. The left ventricle has normal function. The left ventricle has no regional wall motion abnormalities. The left ventricular internal cavity size was normal in size. Right Ventricle: The right ventricular size is normal. Right  ventricular systolic function is normal. Left Atrium: Left atrial size was normal in size. No left atrial/left atrial appendage thrombus was detected. Right Atrium: Right atrial size was normal  in size. Pericardium: There is no evidence of pericardial effusion. Mitral Valve: The mitral valve is normal in structure. Trivial mitral valve regurgitation. Tricuspid Valve: The tricuspid valve is normal in structure. Tricuspid valve regurgitation is trivial. Aortic Valve: The aortic valve is tricuspid. Aortic valve regurgitation is trivial. Pulmonic Valve: The pulmonic valve was normal in structure. Pulmonic valve regurgitation is trivial. Aorta: Aortic dilatation noted. There is moderate dilatation of the aortic root, measuring 46 mm. IAS/Shunts: No atrial level shunt detected by color flow Doppler. Additional Comments: No vegetations; moderately dilated aortic root; suggest CTA or MRA to further assess. Kirk Ruths MD Electronically signed by Kirk Ruths MD Signature Date/Time: 06/06/2021/1:17:06 PM    Final     Microbiology: Results for orders placed or performed during the hospital encounter of 06/28/21  Blood culture (routine x 2)     Status: None (Preliminary result)   Collection Time: 06/28/21  9:15 AM   Specimen: BLOOD  Result Value Ref Range Status   Specimen Description   Final    BLOOD RIGHT ANTECUBITAL Performed at [facility], [address] [place], [place] [address]    Special Requests   Final    BOTTLES DRAWN AEROBIC AND ANAEROBIC Blood Culture results may not be optimal due to an excessive volume of blood received in culture bottles Performed at [facility], [address] [place], [place] [address]    Culture   Final    NO GROWTH 2 DAYS Performed at [facility], [place] [address], [place], [place] [address]    Report Status PENDING  Incomplete  Blood culture (routine x 2)     Status: None (Preliminary result)   Collection Time: 06/28/21 12:25 PM    Specimen: BLOOD LEFT HAND  Result Value Ref Range Status   Specimen Description   Final    BLOOD LEFT HAND Performed at [facility], [address] [place], [place] [address]    Special Requests   Final    BOTTLES DRAWN AEROBIC AND ANAEROBIC Blood Culture adequate volume Performed at [facility], [address] [place], [place] [address]    Culture   Final    NO GROWTH 2 DAYS Performed at [facility], [place] [address], [place] [address]    Report Status PENDING  Incomplete  Resp Panel by RT-PCR (Flu A&B, Covid) Nasopharyngeal Swab     Status: Abnormal   Collection Time: 06/28/21  2:32 PM   Specimen: Nasopharyngeal Swab; Nasopharyngeal(NP) swabs in vial transport medium  Result Value Ref Range Status   SARS Coronavirus 2 by RT PCR POSITIVE (A) NEGATIVE Final    Comment: (NOTE) SARS-CoV-2 target nucleic acids are DETECTED.  The SARS-CoV-2 RNA is generally detectable in upper respiratory specimens during the acute phase of infection. Positive results are indicative of the presence of the identified virus, but do not rule out bacterial infection or co-infection with other pathogens not detected by the test. Clinical correlation with patient history and other diagnostic information is necessary to determine patient infection status. The expected result is Negative.  Fact Sheet for Patients: EntrepreneurPulse.com.au  Fact Sheet for Healthcare Providers: IncredibleEmployment.be  This test is not yet approved or cleared by the [place] FDA and  has been authorized for detection and/or diagnosis of SARS-CoV-2 by FDA under an Emergency Use Authorization (EUA).  This EUA will remain in effect (meaning this test can be used) for the  duration of  the COVID-19 declaration under Section 564(b)(1) of the A ct, 21 U.S.C. section 360bbb-3(b)(1), unless the authorization is terminated or revoked sooner.      Influenza A by PCR NEGATIVE NEGATIVE Final   Influenza B by PCR NEGATIVE NEGATIVE Final    Comment: (NOTE) The Xpert Xpress SARS-CoV-2/FLU/RSV plus assay is intended as an aid in the diagnosis of influenza from Nasopharyngeal swab specimens and should not be used as a sole basis for treatment. Nasal washings and aspirates are unacceptable for Xpert Xpress SARS-CoV-2/FLU/RSV testing.  Fact Sheet for Patients: EntrepreneurPulse.com.au  Fact Sheet for Healthcare Providers: IncredibleEmployment.be  This test is not yet approved or cleared by the [place] FDA and has been authorized for detection and/or diagnosis of SARS-CoV-2 by FDA under an Emergency Use Authorization (EUA). This EUA will remain in effect (meaning this test can be used) for the duration of the COVID-19 declaration under Section 564(b)(1) of the Act, 21 U.S.C. section 360bbb-3(b)(1), unless the authorization is terminated or revoked.  Performed at [facility], [address] [place], [place] [address]     Labs: CBC: Recent Labs  Lab 06/28/21 0915 06/29/21 0043 06/30/21 0124  WBC 2.1*   2.2* 2.3* 3.2*  NEUTROABS 1.4*  --  2.2  HGB 9.5*   9.6* 8.1* 10.8*  HCT 27.4*   27.4* 22.9* 30.3*  MCV 80.8   80.6 81.5 81.7  PLT 53*   65* 55* 70*   Basic Metabolic Panel: Recent Labs  Lab 06/28/21 0915 06/28/21 1447 06/29/21 0043 06/30/21 0124  NA 130*  --  134* 131*  K 4.8  --  4.5 4.7  CL 97*  --  103 98  CO2 23  --  23 25  GLUCOSE 371*  --  271* 325*  BUN 21  --  16 18  CREATININE 1.05  --  1.09 0.96  CALCIUM 9.1  --  8.4* 8.1*  MG  --  1.6*  --   --    Liver Function Tests: Recent Labs  Lab 06/28/21 0915 06/30/21 0124  AST 23 19  ALT 29 22  ALKPHOS 68 70  BILITOT 1.3* 1.3*  PROT 7.1 5.9*  ALBUMIN 3.4* 2.9*   CBG: Recent Labs  Lab 06/29/21 0739 06/29/21 1122 06/29/21 1649 06/29/21 2122 06/30/21 0627  GLUCAP 256* 237* 279* 286* 280*     Discharge time spent: 39 minutes  Signed: Hosie Poisson, MD [facility] 06/30/2021

## 2021-06-30 NOTE — TOC Transition Note (Signed)
Transition of Care [facility]) - CM/SW Discharge Note   Patient Details  Name: Aaron Weaver. MRN: [id number] Date of Birth: [birth year]  Transition of Care [facility] CM/SW Contact:  Pollie Friar, RN Phone Number: 06/30/2021, 11:34 AM   Clinical Narrative:    Patient is discharging home with self care. No needs per TOC.    Final next level of care: Home/Self Care Barriers to Discharge: No Barriers Identified   Patient Goals and CMS Choice        Discharge Placement                       Discharge Plan and Services   Discharge Planning Services: CM Consult                                 Social Determinants of Health (SDOH) Interventions     Readmission Risk Interventions No flowsheet data found.

## 2021-06-30 NOTE — Evaluation (Signed)
Physical Therapy Evaluation Patient Details Name: Aaron SCHANER Sr. MRN: [id number] DOB: [birth year] Today's Date: 06/30/2021   History of Present Illness  66 year old male presented with syncope with L 8-10 rib fractures from fall. 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 There are no further acute PT needs.  Will sign off at this time.        Recommendations for follow up therapy are one component of a multi-disciplinary discharge planning process, led by the attending physician.  Recommendations may be updated based on patient status, additional functional criteria and insurance authorization.  Follow Up Recommendations No PT follow up    Assistance Recommended at Discharge Intermittent Supervision/Assistance  Patient can return home with the following  A little help with bathing/dressing/bathroom;Assistance with cooking/housework;Assist for transportation    Equipment Recommendations None recommended by PT  Recommendations for Other Services       Functional Status Assessment       Precautions / Restrictions Precautions Precautions: Fall Precaution Comments: covid+, watch BP Restrictions Weight Bearing Restrictions: No      Mobility  Bed Mobility Overal bed mobility: Needs Assistance Bed Mobility: Rolling, Sidelying to Sit, Sit to Sidelying Rolling: Supervision Sidelying to sit: Min assist     Sit to sidelying: Min assist General bed mobility comments: acted as stationary surface to pull on for roll, minimal truncal assist up from R elbow.    Transfers Overall transfer level: Needs assistance   Transfers: Sit to/from Stand Sit to  Stand: Min guard           General transfer comment: limited to sit<>stand and minimal side steps towards HOB due to BP this session    Ambulation/Gait Ambulation/Gait assistance: Min guard Gait Distance (Feet): 90 Feet Assistive device: None Gait Pattern/deviations: Step-through pattern   Gait velocity interpretation: 1.31 - 2.62 ft/sec, indicative of limited community ambulator   General Gait Details: guarded, but generally steady carrying tubes.  Mild trouble maintaining posture and breathing, VSS  Stairs Stairs: Yes Stairs assistance: Supervision Stair Management: One rail Right, Alternating pattern, Forwards Number of Stairs: 5    Wheelchair Mobility    Modified Rankin (Stroke Patients Only)       Balance Overall balance assessment: Needs assistance Sitting-balance support: Feet supported Sitting balance-Leahy Scale: Fair     Standing balance support: No upper extremity supported Standing balance-Leahy Scale: Fair                               Pertinent Vitals/Pain Pain Assessment Faces Pain Scale: Hurts even more Pain Location: L ribs Pain Descriptors / Indicators: Discomfort, Grimacing, Guarding    Home Living                          Prior Function                       Hand Dominance        Extremity/Trunk Assessment                Communication      Cognition  Arousal/Alertness: Awake/alert Behavior During Therapy: WFL for tasks assessed/performed Overall Cognitive Status: Within Functional Limits for tasks assessed                                          General Comments      Exercises     Assessment/Plan    PT Assessment    PT Problem List         PT Treatment Interventions      PT Goals (Current goals can be found in the Care Plan section)  Acute Rehab PT Goals Patient Stated Goal: heal at home. PT Goal Formulation: All assessment and education complete, DC  therapy    Frequency       Co-evaluation               AM-PAC PT "6 Clicks" Mobility  Outcome Measure Help needed turning from your back to your side while in a flat bed without using bedrails?: A Little Help needed moving from lying on your back to sitting on the side of a flat bed without using bedrails?: A Little Help needed moving to and from a bed to a chair (including a wheelchair)?: A Little Help needed standing up from a chair using your arms (e.g., wheelchair or bedside chair)?: A Little Help needed to walk in hospital room?: A Little Help needed climbing 3-5 steps with a railing? : A Little 6 Click Score: 18    End of Session   Activity Tolerance: Patient tolerated treatment well Patient left: in bed;with call bell/phone within reach;with bed alarm set Nurse Communication: Mobility status PT Visit Diagnosis: Other abnormalities of gait and mobility (R26.89)    Time: 2706-2376 PT Time Calculation (min) (ACUTE ONLY): 23 min   Charges:   PT Evaluation $PT Eval Low Complexity: 1 Low PT Treatments $Gait Training: 8-22 mins        06/30/2021  Ginger Carne., PT Acute Rehabilitation Services [phone]  (pager) [phone]  (office)  Tessie Fass Ailsa Mireles 06/30/2021, 11:22 AM

## 2021-06-30 NOTE — Progress Notes (Signed)
Inpatient Diabetes Program Recommendations  AACE/ADA: New Consensus Statement on Inpatient Glycemic Control \  Target Ranges:  Prepandial:   less than 140 mg/dL      Peak postprandial:   less than 180 mg/dL (1-2 hours)      Critically ill patients:  140 - 180 mg/dL    Latest Reference Range & Units 06/29/21 07:39 06/29/21 11:22 06/29/21 16:49 06/29/21 21:22 06/30/21 06:27  Glucose-Capillary 70 - 99 mg/dL 256 (H) 237 (H) 279 (H) 286 (H) 280 (H)   Review of Glycemic Control  Diabetes history: DM2 Outpatient Diabetes medications: Lantus 18 units daily, Humalog 2-15 units QID Current orders for Inpatient glycemic control: Semglee 24 units QHS, Novolog 0-9 units TID with meals, Novolog 0-5 units QHS, Novolog 3 units TID with meals   Inpatient Diabetes Program Recommendations:     Insulin: Please consider increasing Semglee to 27 units QHS and meal coverage to Novolog 5 units TID with meals if patient eats at least 50% of meals.   Outpatient: May want to consider adjusting outpatient insulin regimen at time of discharge.  Thanks, Barnie Alderman, RN, MSN, CDE Diabetes Coordinator Inpatient Diabetes Program [phone] (Team Pager from 8am to 5pm)

## 2021-06-30 NOTE — Progress Notes (Signed)
Aaron Weaver is feeling better.  He had a 2 units of blood yesterday.  His hemoglobin is 10.8.  His white cell count 3.2.  This is on the way up.  His platelet count is 70,000.  This also is improving.  Interventional Radiology still would like to hold off on the Port-A-Cath.  This is certainly reasonable.  I understand their concerns.  All cultures have been negative to date.  His blood pressure certainly is much better.  He says he was taking atenolol 100 mg a day.  We have him on 50 mg a day now.  This I think is reasonable.  He had a EEG done I think couple days ago.  The result seems to be normal.  His electrolytes show sodium 131.  Potassium 4.7.  His blood sugar is 325.  BUN 18 creatinine 0.96.  Calcium 8.1.  Total bilirubin is 1.3.  He has had a good appetite.  He has had no nausea or vomiting.  There has been no diarrhea.  His vital signs are temperature 97.9.  Pulse 86.  Blood pressure 121/75.  His oral exam shows no mucositis.  Lungs are clear.  Cardiac exam regular rate and rhythm.  Abdomen is soft.  Bowel sounds are present.  There is no fluid wave.  Extremity shows no clubbing, cyanosis or edema.  Neurological exam is nonfocal.  Hopefully, Aaron Weaver will be able to go home, maybe even today.  He is doing better.  I still think there may have been a component of dehydration.  He was quite anemic.  We transfusion.  His hemoglobin has come up nicely.  His blood sugar is certainly are quite high.  Hopefully these will be under better control.  I think that what we will need to do is at after he gets his chemotherapy, we need to set him up with IV fluids.  He says this will make him feel better.  I can arrange for all this once we see him back in the office.  Again, there is no infection.  Cultures are negative.  His blood pressure looks okay.  EEG looks okay.  I do appreciate the great care is gotten from all the staff on 3 W.  Lattie Haw, MD  [facility] 1:37

## 2021-06-30 NOTE — Progress Notes (Signed)
OT Cancellation Note  Patient Details Name: Aaron STACHNIK Sr. MRN: [id number] DOB: 25-May-1955   Cancelled Treatment:    Reason Eval/Treat Not Completed: Other (comment) (Discussed with PT. Pt indepdnetn. OT signing off)  Ramond Dial, OT/L   Acute OT Clinical Specialist [place] Pager [phone] Office [phone]  06/30/2021, 11:22 AM

## 2021-07-01 ENCOUNTER — Encounter: Payer: Self-pay | Admitting: Cardiology

## 2021-07-01 ENCOUNTER — Inpatient Hospital Stay: Payer: Medicare Other

## 2021-07-01 NOTE — Telephone Encounter (Signed)
Called pt reviewed Aaron Weaver, [place] recommendations.  RN reviewed instructions for decreasing atenolol from 100 mg to 50 mg PO QD if SBP <110 consistently.  Advised pt to either call in or send my chart message informing office if medication change is needed. Pt reports took monitor off and return package became wet needs return address.  Will route to [place] monitors to follow up.  Pt verbalizes understanding.  All questions answered.

## 2021-07-01 NOTE — Telephone Encounter (Signed)
I reviewed notes from the hospital.  His Hgb was low and they transfused him with 2 units of PRBCs.  The last note from Dr. Marin Olp states his BP is better and his Hgb was improved.  His orthostatic hypotension was likely related to anemia.    PLAN:  I would keep an eye on BP at home.  If systolic BP is < 312 consistently, he can decrease the Atenolol from 100 mg to 50 mg once daily.  Please call patient and ask him to clarify what is going on with the monitor.  Richardson Dopp, PA-C    07/01/2021 12:20 PM

## 2021-07-03 LAB — CULTURE, BLOOD (ROUTINE X 2)
Culture: NO GROWTH
Culture: NO GROWTH
Special Requests: ADEQUATE

## 2021-07-06 ENCOUNTER — Other Ambulatory Visit: Payer: Self-pay | Admitting: Physician Assistant

## 2021-07-06 ENCOUNTER — Ambulatory Visit: Payer: Medicare Other | Admitting: Internal Medicine

## 2021-07-06 ENCOUNTER — Other Ambulatory Visit: Payer: Self-pay | Admitting: Radiology

## 2021-07-06 ENCOUNTER — Encounter: Payer: Self-pay | Admitting: Internal Medicine

## 2021-07-06 ENCOUNTER — Other Ambulatory Visit: Payer: Self-pay

## 2021-07-06 VITALS — BP 136/90 | HR 71 | Temp 97.8°F | Wt 176.0 lb

## 2021-07-06 DIAGNOSIS — R7881 Bacteremia: Secondary | ICD-10-CM | POA: Diagnosis not present

## 2021-07-06 DIAGNOSIS — B9562 Methicillin resistant Staphylococcus aureus infection as the cause of diseases classified elsewhere: Secondary | ICD-10-CM

## 2021-07-06 NOTE — Progress Notes (Signed)
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 ([place]) 05/04/2021   Soft tissue mass 03/22/2021   COPD (chronic obstructive pulmonary disease) ([place]) 10/14/2020   AAA (abdominal aortic aneurysm) without rupture 06/15/2020   Restless leg syndrome 07/26/2011   Hip pain, left 03/28/2011   ERECTILE DYSFUNCTION, 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   Lumbago 07/22/2010   Pulmonary nodule 1 cm or greater in diameter 01/07/2009   SINUSITIS, ACUTE 06/18/2008   CRAMPS,LEG 06/18/2008   TOBACCO USE DISORDER/SMOKER-SMOKING CESSATION DISCUSSED 04/10/2008   CERUMEN IMPACTION 04/10/2008   CALLUS, TOE 04/10/2008   Hyperlipidemia 03/14/2007   Depression 03/14/2007   ALLERGIC RHINITIS 03/14/2007   DM2 (diabetes mellitus, type 2) ([place]) 01/11/2007   ANXIETY 01/11/2007   Essential hypertension 01/11/2007      Subjective:    Patient ID: Aaron Fee Sr., male    DOB: Jul 14, 1955, 66 y.o.   MRN: [id number]  Chief Complaint  Patient presents with   Hospitalization Follow-up    HPI:  Aaron CHADDOCK Sr. is a 66 y.o. male dlbcl on chemo, dm2, aaa s/p evar, here for hospital f/u of mrsa bacteremia related to his previous port-a-cath  ID saw during his admission at [facility] 1/22-2/01/23. Port removed 1/25 -- mrsa cx positive cath tip. Tee 1/30 no vegetation. No other metastatic focus of infection. She received 4 week abx total (on  discharge had 2 more weeks zyvox then 1 week doxy) after 1 week of vanc in the hospital  She had another admission 2/21-23 for syncope and ribs fracture. No sign of infectious concern at that time.   Last cycle chemo 06/17/21  Will have new port placed tomorrow 3/2 to resume chemo  He has still moderate pain on the left posterolateral area of the chest where the ribs fracture  No fever, chill, malaise, back or other joint pain   Allergies  Allergen Reactions   Fexofenadine Other (See Comments)    "dries me out too much"   Hydrochlorothiazide Other (See Comments)    Cramps   Penicillins Hives   Pravastatin Sodium Other (See Comments)    Aches   Requip [Ropinirole Hcl] Other (See Comments)    Bad dreams      Outpatient Medications Prior to Visit  Medication Sig Dispense Refill   acetaminophen (TYLENOL) 325 MG tablet Take 650 mg by mouth every 6 (six) hours as needed for mild pain or headache.     albuterol (PROAIR HFA) 108 (90 Base) MCG/ACT inhaler Inhale 1-2 puffs into the lungs every 6 (six) hours as needed for wheezing or shortness of breath. 8 g 6   alendronate (FOSAMAX) 70 MG tablet Take 70 mg by mouth every Sunday.     allopurinol (ZYLOPRIM) 100 MG tablet Take 1 tablet (100 mg total) by mouth daily. Start 3 days BEFORE chemotherapy starts (Patient taking differently: Take 100 mg by mouth daily.) 30 tablet 0  aspirin EC 81 MG EC tablet Take 1 tablet (81 mg total) by mouth daily at 6 (six) AM. Swallow whole. (Patient taking differently: Take 81 mg by mouth in the morning. Swallow whole.) 30 tablet 11   atenolol (TENORMIN) 100 MG tablet Take 100 mg by mouth daily.     blood glucose meter kit and supplies KIT Dispense based on patient and insurance preference. Use up to four times daily as directed. 1 each 0   calcium carbonate (OSCAL) 1500 (600 Ca) MG TABS tablet Take 600 mg of elemental calcium by mouth daily with breakfast.     Continuous Blood Gluc Receiver (FREESTYLE LIBRE  2 READER) DEVI      Continuous Blood Gluc Sensor (FREESTYLE LIBRE 14 DAY SENSOR) MISC Inject 1 Device into the skin every 14 (fourteen) days.     diclofenac Sodium (VOLTAREN) 1 % GEL Apply 2 g topically daily as needed (to painful sites).     DULoxetine (CYMBALTA) 60 MG capsule Take 60 mg by mouth at bedtime.     Fish Oil OIL Take 1,500 mg by mouth daily.     insulin glargine (LANTUS) 100 UNIT/ML Solostar Pen Inject 25 Units into the skin daily. 15 mL 11   insulin lispro (HUMALOG) 100 UNIT/ML KwikPen Inject 2-15 Units into the skin 4 (four) times daily - after meals and at bedtime. Glucose 121 - 150: 2 units, Glucose 151 - 200: 3 units, Glucose 201 - 250: 5 units, Glucose 251 - 300: 8 units, Glucose 301 - 350: 11 units, Glucose 351 - 400: 15 units, Glucose > 400 call MD 15 mL 11   Insulin Pen Needle 32G X 4 MM MISC 1 each by Does not apply route 4 (four) times daily - after meals and at bedtime. 100 each 5   LORazepam (ATIVAN) 0.5 MG tablet Take 1 tablet (0.5 mg total) by mouth every 8 (eight) hours. Take for nausea prn (Patient taking differently: Take 0.5 mg by mouth every 8 (eight) hours as needed for anxiety (or nausea).) 30 tablet 1   omeprazole (PRILOSEC) 40 MG capsule Take 40 mg by mouth every evening.     ondansetron (ZOFRAN) 8 MG tablet Take 1 tablet (8 mg total) by mouth 2 (two) times daily as needed for refractory nausea / vomiting. Start on day 3 after cyclophosphamide chemotherapy. 30 tablet 1   potassium chloride SA (KLOR-CON M20) 20 MEQ tablet TAKE 1 TABLET (20 MEQ TOTAL) BY MOUTH DAILY.  (Patient taking differently: Take 20 mEq by mouth in the morning.) 30 tablet 5   prochlorperazine (COMPAZINE) 10 MG tablet Take 1 tablet (10 mg total) by mouth every 6 (six) hours as needed (Nausea or vomiting). (Patient taking differently: Take 10 mg by mouth every 6 (six) hours as needed for vomiting or nausea.) 30 tablet 6   rosuvastatin (CRESTOR) 10 MG tablet Take 10 mg by mouth daily.     SEREVENT  DISKUS 50 MCG/ACT diskus inhaler Inhale 1 puff into the lungs 2 (two) times daily.     tadalafil (CIALIS) 5 MG tablet Take 5 mg by mouth in the morning.     tamsulosin (FLOMAX) 0.4 MG CAPS capsule Take 0.4 mg by mouth every evening.     Zinc 50 MG TABS Take 50 mg by mouth daily.     famciclovir (FAMVIR) 250 MG tablet Take 1 tablet (250 mg total) by mouth 2 (two) times daily. (Patient not taking: Reported on 07/06/2021) 30 tablet 8   No facility-administered medications  prior to visit.     Social History   Socioeconomic History   Marital status: Married    Spouse name: Franciszek Platten   Number of children: 1   Years of education: Not on file   Highest education level: Not on file  Occupational History   Occupation: [employer]  Tobacco Use   Smoking status: Former    Packs/day: 0.50    Years: 53.00    Pack years: 26.50    Types: Cigarettes    Start date: 03/31/1965    Quit date: 07/22/2017    Years since quitting: 3.9   Smokeless tobacco: Never   Tobacco comments:    started at age 67.  Vaping Use   Vaping Use: Never used  Substance and Sexual Activity   Alcohol use: Yes    Comment: rare beer once or twice   Drug use: No   Sexual activity: Yes  Other Topics Concern   Not on file  Social History Narrative   Not on file   Social Determinants of Health   Financial Resource Strain: Not on file  Food Insecurity: Not on file  Transportation Needs: Not on file  Physical Activity: Not on file  Stress: Not on file  Social Connections: Not on file  Intimate Partner Violence: Not on file      Review of Systems     Objective:    BP 136/90    Pulse 71    Temp 97.8 F (36.6 C) (Temporal)    Wt 176 lb (79.8 kg)    BMI 23.87 kg/m  Nursing note and vital signs reviewed.  Physical Exam    General/constitutional: no distress, pleasant HEENT: Normocephalic, PER, Conj Clear, EOMI, Oropharynx clear Neck supple CV: rrr no mrg Lungs: clear to auscultation, normal  respiratory effort Abd: Soft, Nontender Ext: no edema Skin: tenderness/purpura on the left posterolateral chest wall -- purpura extends to posterior superior iliac spine Neuro: nonfocal MSK: no peripheral joint swelling/tenderness/warmth; back spines nontender   Labs: Lab Results  Component Value Date   WBC 3.2 (L) 06/30/2021   HGB 10.8 (L) 06/30/2021   HCT 30.3 (L) 06/30/2021   MCV 81.7 06/30/2021   PLT 70 (L) 40/98/1191   Last metabolic panel Lab Results  Component Value Date   GLUCOSE 325 (H) 06/30/2021   NA 131 (L) 06/30/2021   K 4.7 06/30/2021   CL 98 06/30/2021   CO2 25 06/30/2021   BUN 18 06/30/2021   CREATININE 0.96 06/30/2021   GFRNONAA >60 06/30/2021   CALCIUM 8.1 (L) 06/30/2021   PROT 5.9 (L) 06/30/2021   ALBUMIN 2.9 (L) 06/30/2021   BILITOT 1.3 (H) 06/30/2021   ALKPHOS 70 06/30/2021   AST 19 06/30/2021   ALT 22 06/30/2021   ANIONGAP 8 06/30/2021    Micro:  Serology:  Imaging:  Assessment & Plan:   Problem List Items Addressed This Visit       Other   MRSA bacteremia - Primary       Lines: 1/06-1/25 port-a-cath right chest   Abx: 2/01-3 weeks (linezolid 2 weeks and doxy 1 week) 1/22-2/01 vancomycin   1/23-27 remdesivir 1/22-23 cefepime     A/p Mrsa bacteremia Clabsi -- Port-a-cath removed 1/25 Right axillary abscess s/p I&D 1/25 Mild covid infection History of AAA endovascular stent repair 07/2020 12/21 right axillary lymph node excision biopsy Lymphoma recent chemo initiation mid 05/2021 Chemo related pancytopenia -- resolved   1/22 bcx 1 of 2 set positive mrsa (S tetra, bactrim)  1/24 bcx negative 1/25 right axillary surgical tissue cx mrsa 1/25 port-a-cath tip mrsa/staph hominis   Tte no valve vegetation. Tee no vegetation    Patient doing well post treatment. Finished abx about 10 days ago. Clinically no sign of relapse  -ok to resume chemotherapy per oncology plan -no need for ID f/u   Follow-up: No follow-ups on  file.      Jabier Mutton, [place] [facility] [place] [facility] [phone]  pager   [phone] cell 07/06/2021, 3:49 PM

## 2021-07-06 NOTE — Patient Instructions (Addendum)
From ID standpoint you are good to go with placement of new port and resuming chemotherapy ? ? ? ?If persistent malaise, weakness, decreased intake, or fever/chill, new joint pain/focal pain then let me know or let your oncologist know. Especially with fever on chemo, I would advise going to urgent care or emergency department ?

## 2021-07-07 ENCOUNTER — Other Ambulatory Visit (HOSPITAL_COMMUNITY): Payer: Self-pay | Admitting: Physician Assistant

## 2021-07-07 ENCOUNTER — Encounter (HOSPITAL_COMMUNITY): Payer: Self-pay

## 2021-07-07 ENCOUNTER — Other Ambulatory Visit: Payer: Self-pay | Admitting: Hematology & Oncology

## 2021-07-07 ENCOUNTER — Ambulatory Visit (HOSPITAL_COMMUNITY)
Admission: RE | Admit: 2021-07-07 | Discharge: 2021-07-07 | Disposition: A | Discharge status: Home / Self Care | Payer: Medicare Other | Source: Ambulatory Visit | Attending: Hematology & Oncology | Admitting: Hematology & Oncology

## 2021-07-07 DIAGNOSIS — C8338 Diffuse large B-cell lymphoma, lymph nodes of multiple sites: Secondary | ICD-10-CM | POA: Insufficient documentation

## 2021-07-07 DIAGNOSIS — Z8616 Personal history of COVID-19: Secondary | ICD-10-CM | POA: Diagnosis not present

## 2021-07-07 DIAGNOSIS — E119 Type 2 diabetes mellitus without complications: Secondary | ICD-10-CM | POA: Insufficient documentation

## 2021-07-07 DIAGNOSIS — Z8619 Personal history of other infectious and parasitic diseases: Secondary | ICD-10-CM | POA: Insufficient documentation

## 2021-07-07 DIAGNOSIS — Z452 Encounter for adjustment and management of vascular access device: Secondary | ICD-10-CM | POA: Diagnosis not present

## 2021-07-07 DIAGNOSIS — C8590 Non-Hodgkin lymphoma, unspecified, unspecified site: Secondary | ICD-10-CM | POA: Diagnosis not present

## 2021-07-07 HISTORY — PX: IR IMAGING GUIDED PORT INSERTION: IMG5740

## 2021-07-07 LAB — CBC WITH DIFFERENTIAL/PLATELET
Abs Immature Granulocytes: 0.15 10*3/uL — ABNORMAL HIGH (ref 0.00–0.07)
Basophils Absolute: 0.1 10*3/uL (ref 0.0–0.1)
Basophils Relative: 1 %
Eosinophils Absolute: 0.1 10*3/uL (ref 0.0–0.5)
Eosinophils Relative: 1 %
HCT: 35.9 % — ABNORMAL LOW (ref 39.0–52.0)
Hemoglobin: 12.1 g/dL — ABNORMAL LOW (ref 13.0–17.0)
Immature Granulocytes: 2 %
Lymphocytes Relative: 13 %
Lymphs Abs: 0.9 10*3/uL (ref 0.7–4.0)
MCH: 29.7 pg (ref 26.0–34.0)
MCHC: 33.7 g/dL (ref 30.0–36.0)
MCV: 88 fL (ref 80.0–100.0)
Monocytes Absolute: 1 10*3/uL (ref 0.1–1.0)
Monocytes Relative: 14 %
Neutro Abs: 4.8 10*3/uL (ref 1.7–7.7)
Neutrophils Relative %: 69 %
Platelets: 278 10*3/uL (ref 150–400)
RBC: 4.08 MIL/uL — ABNORMAL LOW (ref 4.22–5.81)
RDW: 18.9 % — ABNORMAL HIGH (ref 11.5–15.5)
WBC: 6.9 10*3/uL (ref 4.0–10.5)
nRBC: 0.3 % — ABNORMAL HIGH (ref 0.0–0.2)

## 2021-07-07 LAB — GLUCOSE, CAPILLARY: Glucose-Capillary: 319 mg/dL — ABNORMAL HIGH (ref 70–99)

## 2021-07-07 MED ORDER — HEPARIN SOD (PORK) LOCK FLUSH 100 UNIT/ML IV SOLN
INTRAVENOUS | Status: AC | PRN
  Administered 2021-07-07: 500 [IU] via INTRAVENOUS

## 2021-07-07 MED ORDER — MIDAZOLAM HCL 2 MG/2ML IJ SOLN
INTRAMUSCULAR | Status: AC | PRN
  Administered 2021-07-07: 1 mg via INTRAVENOUS
  Administered 2021-07-07 (×2): .5 mg via INTRAVENOUS

## 2021-07-07 MED ORDER — LIDOCAINE-EPINEPHRINE 1 %-1:100000 IJ SOLN
INTRAMUSCULAR | Status: AC
  Filled 2021-07-07: qty 1

## 2021-07-07 MED ORDER — LIDOCAINE-EPINEPHRINE 1 %-1:100000 IJ SOLN
INTRAMUSCULAR | Status: AC | PRN
  Administered 2021-07-07: 10 mL

## 2021-07-07 MED ORDER — INSULIN ASPART 100 UNIT/ML IJ SOLN
INTRAMUSCULAR | Status: AC
  Administered 2021-07-07: 4 [IU] via SUBCUTANEOUS
  Filled 2021-07-07: qty 1

## 2021-07-07 MED ORDER — VANCOMYCIN HCL IN DEXTROSE 1-5 GM/200ML-% IV SOLN
1000.0000 mg | INTRAVENOUS | Status: AC
  Administered 2021-07-07: 1000 mg via INTRAVENOUS
  Filled 2021-07-07: qty 200

## 2021-07-07 MED ORDER — HEPARIN SOD (PORK) LOCK FLUSH 100 UNIT/ML IV SOLN
INTRAVENOUS | Status: AC
  Filled 2021-07-07: qty 5

## 2021-07-07 MED ORDER — FENTANYL CITRATE (PF) 100 MCG/2ML IJ SOLN
INTRAMUSCULAR | Status: AC
  Filled 2021-07-07: qty 2

## 2021-07-07 MED ORDER — INSULIN ASPART 100 UNIT/ML IJ SOLN: 4.0000 [IU] | INTRAMUSCULAR | Status: AC

## 2021-07-07 MED ORDER — MIDAZOLAM HCL 2 MG/2ML IJ SOLN
INTRAMUSCULAR | Status: AC
  Filled 2021-07-07: qty 2

## 2021-07-07 MED ORDER — SODIUM CHLORIDE 0.9 % IV SOLN: INTRAVENOUS | Status: DC

## 2021-07-07 MED ORDER — FENTANYL CITRATE (PF) 100 MCG/2ML IJ SOLN
INTRAMUSCULAR | Status: AC | PRN
Start: 2021-07-07 — End: 2021-07-07
  Administered 2021-07-07: 25 ug via INTRAVENOUS
  Administered 2021-07-07: 50 ug via INTRAVENOUS
  Administered 2021-07-07: 25 ug via INTRAVENOUS

## 2021-07-07 NOTE — Discharge Instructions (Signed)
Interventional radiology phone numbers °[phone] °After hours [phone] ° ° ° °You have skin glue (dermabond) over your new port. Do not use the lidocaine cream (EMLA cream) over the skin glue until it has healed. The petroleum in the lidocaine cream will dissolve the skin glue resulting in an infection of your new port. Use ice in a zip lock bag for 1-2 minutes over your new port before the cancer center nurses access your port. ° ° °Implanted Port Insertion, Care After °This sheet gives you information about how to care for yourself after your procedure. Your health care provider may also give you more specific instructions. If you have problems or questions, contact your health care provider. °What can I expect after the procedure? °After the procedure, it is common to have: °Discomfort at the port insertion site. °Bruising on the skin over the port. This should improve over 3-4 days. °Follow these instructions at home: °Port care °After your port is placed, you will get a manufacturer's information card. The card has information about your port. Keep this card with you at all times. °Take care of the port as told by your health care provider. Ask your health care provider if you or a family member can get training for taking care of the port at home. A home health care nurse may also take care of the port. °Make sure to remember what type of port you have. °Incision care °Follow instructions from your health care provider about how to take care of your port insertion site. Make sure you: °Wash your hands with soap and water before and after you change your bandage (dressing). If soap and water are not available, use hand sanitizer. °Change your dressing as told by your health care provider. °Leave skin glue in place. These skin closures may need to stay in place for 2 weeks or longer.  °Check your port insertion site every day for signs of infection. Check for: °Redness, swelling, or pain. °Fluid or  blood. °Warmth. °Pus or a bad smell.  °  °  °Activity °Return to your normal activities as told by your health care provider. Ask your health care provider what activities are safe for you. °Do not lift anything that is heavier than 10 lb (4.5 kg), or the limit that you are told, until your health care provider says that it is safe. °General instructions °Take over-the-counter and prescription medicines only as told by your health care provider. °Do not take baths, swim, or use a hot tub until your health care provider approves.You may remove your dressing tomorrow and shower 24 hours after your procedure. °Do not drive for 24 hours if you were given a sedative during your procedure. °Wear a medical alert bracelet in case of an emergency. This will tell any health care providers that you have a port. °Keep all follow-up visits as told by your health care provider. This is important. °Contact a health care provider if: °You cannot flush your port with saline as directed, or you cannot draw blood from the port. °You have a fever or chills. °You have redness, swelling, or pain around your port insertion site. °You have fluid or blood coming from your port insertion site. °Your port insertion site feels warm to the touch. °You have pus or a bad smell coming from the port insertion site. °Get help right away if: °You have chest pain or shortness of breath. °You have bleeding from your port that you cannot control. °Summary °Take care of   the port as told by your health care provider. Keep the manufacturer's information card with you at all times. °Change your dressing as told by your health care provider. °Contact a health care provider if you have a fever or chills or if you have redness, swelling, or pain around your port insertion site. °Keep all follow-up visits as told by your health care provider. °This information is not intended to replace advice given to you by your health care provider. Make sure you discuss any  questions you have with your health care provider. °Document Revised: 11/20/2017 Document Reviewed: 11/20/2017 °Elsevier Patient Education © 2021 [employer] ° ° ° °Moderate Conscious Sedation, Adult, Care After °This sheet gives you information about how to care for yourself after your procedure. Your health care provider may also give you more specific instructions. If you have problems or questions, contact your health care provider. °What can I expect after the procedure? °After the procedure, it is common to have: °Sleepiness for several hours. °Impaired judgment for several hours. °Difficulty with balance. °Vomiting if you eat too soon. °Follow these instructions at home: °For the time period you were told by your health care provider: °Rest. °Do not participate in activities where you could fall or become injured. °Do not drive or use machinery. °Do not drink alcohol. °Do not take sleeping pills or medicines that cause drowsiness. °Do not make important decisions or sign legal documents. °Do not take care of children on your own.  °  °  °Eating and drinking °Follow the diet recommended by your health care provider. °Drink enough fluid to keep your urine pale yellow. °If you vomit: °Drink water, juice, or soup when you can drink without vomiting. °Make sure you have little or no nausea before eating solid foods.   °General instructions °Take over-the-counter and prescription medicines only as told by your health care provider. °Have a responsible adult stay with you for the time you are told. It is important to have someone help care for you until you are awake and alert. °Do not smoke. °Keep all follow-up visits as told by your health care provider. This is important. °Contact a health care provider if: °You are still sleepy or having trouble with balance after 24 hours. °You feel light-headed. °You keep feeling nauseous or you keep vomiting. °You develop a rash. °You have a fever. °You have redness or  swelling around the IV site. °Get help right away if: °You have trouble breathing. °You have new-onset confusion at home. °Summary °After the procedure, it is common to feel sleepy, have impaired judgment, or feel nauseous if you eat too soon. °Rest after you get home. Know the things you should not do after the procedure. °Follow the diet recommended by your health care provider and drink enough fluid to keep your urine pale yellow. °Get help right away if you have trouble breathing or new-onset confusion at home. °This information is not intended to replace advice given to you by your health care provider. Make sure you discuss any questions you have with your health care provider. °Document Revised: 08/22/2019 Document Reviewed: 03/20/2019 °Elsevier Patient Education © 2021 [employer]  °

## 2021-07-07 NOTE — H&P (Signed)
Referring Physician(s): Ennever,Peter R  Supervising Physician: Sandi Mariscal  Patient Status:  [facility]  Chief Complaint:  "I'm here for a port a cath"  Subjective: Patient known to IR service from bone marrow biopsy on 06/08/2021 and left upper extremity PICC placement on 06/16/2021.  He has a history of diffuse large B-cell lymphoma and presents today for Port-A-Cath placement to assist with treatment.  He was treated in January of this year for MRSA bacteremia ,however latest blood cultures are negative and he has completed antibiotic therapy.  He also had COVID infection in January 2023.  Currently denies fever, chills, headache, worsening dyspnea, cough, abdominal pain, nausea, vomiting or bleeding.  He does have left chest wall pain secondary to rib fractures.  Additional history as listed below.  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 ([place]) 05/04/2021   Dysrhythmia    GERD (gastroesophageal reflux disease)    Impacted cerumen    Impotence of organic origin    Lumbago    Lung nodule    Other and unspecified hyperlipidemia    Routine general medical examination at a health care facility    Skipped heart beats    Tobacco use disorder    Type II or unspecified type diabetes mellitus without mention of complication, not stated as uncontrolled    type II   Unspecified essential hypertension    Past Surgical History:  Procedure Laterality Date   ABDOMINAL AORTIC ENDOVASCULAR STENT GRAFT Bilateral 07/12/2020   Procedure: ABDOMINAL AORTIC ENDOVASCULAR STENT GRAFT;  Surgeon: [name], MD;  Location: [facility] OR;  Service: Vascular;  Laterality: Bilateral;   AXILLARY LYMPH NODE  BIOPSY Right 04/27/2021   Procedure: AXILLARY EXCISIONAL LYMPH NODE BIOPSY;  Surgeon: Dwan Bolt, MD;  Location: [facility];  Service: General;  Laterality: Right;   BACK SURGERY     fracture   CARPECTOMY Right 09/11/2019   Procedure: PROXIMAL ROW CARPECTOMY; RADIAL STYLOIDECTOMY; POSTERIOR INTEROSSIUS NERVE RESECTION;  Surgeon: Daryll Brod, MD;  Location: [facility];  Service: Orthopedics;  Laterality: Right;  AXILLARY BLOCK   COLONOSCOPY     INGUINAL HERNIA REPAIR     right   IR FLUORO GUIDE CV LINE RIGHT  06/16/2021   IR US GUIDE VASC ACCESS LEFT  06/16/2021   POLYPECTOMY     PORT-A-CATH REMOVAL N/A 06/01/2021   Procedure: REMOVAL PORT-A-CATH;  Surgeon: Armandina Gemma, MD;  Location: [facility]S;  Service: General;  Laterality: N/A;   PORTACATH PLACEMENT Right 05/13/2021   Procedure: INSERTION PORT-A-CATH;  Surgeon: Dwan Bolt, MD;  Location: [facility] ORS;  Service: General;  Laterality: Right;   TEE WITHOUT CARDIOVERSION N/A 06/06/2021   Procedure: TRANSESOPHAGEAL ECHOCARDIOGRAM (TEE);  Surgeon: Lelon Perla, MD;  [facility] ENDOSCOPY;  Service: Cardiovascular;  Laterality: N/A;   TONSILLECTOMY        Allergies: Fexofenadine, Hydrochlorothiazide, Penicillins, Pravastatin sodium, and Requip [ropinirole hcl]  Medications: Prior to Admission medications   Medication Sig Start Date End Date Taking? Authorizing Provider  acetaminophen (TYLENOL) 325 MG tablet Take 650 mg by mouth every 6 (six) hours  as needed for mild pain or headache.   Yes [provider]  albuterol (PROAIR HFA) 108 (90 Base) MCG/ACT inhaler Inhale 1-2 puffs into the lungs every 6 (six) hours as needed for wheezing or shortness of breath. 10/15/20  Yes Collene Gobble, MD  alendronate (FOSAMAX) 70 MG tablet Take 70 mg by mouth every Sunday. 02/12/20  Yes [provider]  allopurinol (ZYLOPRIM) 100 MG tablet Take 1 tablet (100 mg total) by mouth daily. Start 3 days BEFORE  chemotherapy starts Patient taking differently: Take 100 mg by mouth daily. 06/09/21  Yes [name], MD  aspirin EC 81 MG EC tablet Take 1 tablet (81 mg total) by mouth daily at 6 (six) AM. Swallow whole. Patient taking differently: Take 81 mg by mouth in the morning. Swallow whole. 07/14/20  Yes Ulyses Amor, PA-C  atenolol (TENORMIN) 100 MG tablet Take 100 mg by mouth daily. 09/02/20  Yes [provider]  calcium carbonate (OSCAL) 1500 (600 Ca) MG TABS tablet Take 600 mg of elemental calcium by mouth daily with breakfast.   Yes [provider]  diclofenac Sodium (VOLTAREN) 1 % GEL Apply 2 g topically daily as needed (to painful sites).   Yes [provider]  DULoxetine (CYMBALTA) 60 MG capsule Take 60 mg by mouth at bedtime. 04/01/18  Yes [provider]  Fish Oil OIL Take 1,500 mg by mouth daily.   Yes [provider]  insulin glargine (LANTUS) 100 UNIT/ML Solostar Pen Inject 25 Units into the skin daily. 06/30/21  Yes Hosie Poisson, MD  insulin lispro (HUMALOG) 100 UNIT/ML KwikPen Inject 2-15 Units into the skin 4 (four) times daily - after meals and at bedtime. Glucose 121 - 150: 2 units, Glucose 151 - 200: 3 units, Glucose 201 - 250: 5 units, Glucose 251 - 300: 8 units, Glucose 301 - 350: 11 units, Glucose 351 - 400: 15 units, Glucose > 400 call MD 06/08/21  Yes Dwyane Dee, MD  omeprazole (PRILOSEC) 40 MG capsule Take 40 mg by mouth every evening.   Yes [provider]  potassium chloride SA (KLOR-CON M20) 20 MEQ tablet TAKE 1 TABLET (20 MEQ TOTAL) BY MOUTH DAILY.  Patient taking differently: Take 20 mEq by mouth in the morning. 02/13/13  Yes Plotnikov, Evie Lacks, MD  rosuvastatin (CRESTOR) 10 MG tablet Take 10 mg by mouth daily. 03/16/20  Yes [provider]  SEREVENT DISKUS 50 MCG/ACT diskus inhaler Inhale 1 puff into the lungs 2 (two) times daily. 05/25/21  Yes [provider]  tadalafil (CIALIS) 5 MG tablet Take 5 mg  by mouth in the morning.   Yes [provider]  tamsulosin (FLOMAX) 0.4 MG CAPS capsule Take 0.4 mg by mouth every evening.   Yes [provider]  Zinc 50 MG TABS Take 50 mg by mouth daily.   Yes [provider]  blood glucose meter kit and supplies KIT Dispense based on patient and insurance preference. Use up to four times daily as directed. 06/08/21   Dwyane Dee, MD  Continuous Blood Gluc Receiver (FREESTYLE LIBRE 2 READER) [place]  06/16/21   [provider]  Continuous Blood Gluc Sensor (FREESTYLE LIBRE 14 DAY SENSOR) MISC Inject 1 Device into the skin every 14 (fourteen) days.    [provider]  famciclovir (FAMVIR) 250 MG tablet Take 1 tablet (250 mg total) by mouth 2 (two) times daily. Patient not taking: Reported on 07/06/2021 06/09/21   [name], MD  Insulin Pen Needle 32G X 4 MM MISC 1 each by Does not apply route 4 (four) times daily - after meals and at bedtime. 06/08/21   Dwyane Dee, MD  LORazepam (ATIVAN) 0.5 MG tablet Take 1 tablet (0.5 mg total) by mouth every 8 (eight) hours. Take for nausea prn Patient taking differently: Take 0.5 mg by mouth every 8 (eight) hours as needed for anxiety (or nausea). 05/17/21   [name], MD  ondansetron (ZOFRAN) 8 MG tablet Take 1 tablet (8 mg total) by mouth 2 (two) times daily as needed for refractory nausea / vomiting. Start on day 3 after cyclophosphamide chemotherapy. 05/17/21   [name], MD  prochlorperazine (COMPAZINE) 10 MG tablet Take 1 tablet (10 mg total) by mouth every 6 (six) hours as needed (Nausea or vomiting). Patient taking differently: Take 10 mg by mouth every 6 (six) hours as needed for vomiting or nausea. 05/17/21   [name], MD     Vital Signs: BP (!) (P) 153/98    Pulse (P) 69    Temp (P) 97.6 F (36.4 C) (Oral)    Resp (P) 20    SpO2 (P) 99%   Physical Exam awake, alert.  Chest clear to auscultation bilaterally.  Left lateral chest wall tenderness to  palpation; /purpura on the left posterolateral chest wall -- purpura extends to posterior superior iliac spine; heart with regular rate and rhythm.  Abdomen soft, positive bowel sounds, nontender.  No lower extremity edema.  Imaging: No results found.  Labs:  CBC: Recent Labs    06/28/21 0915 06/29/21 0043 06/30/21 0124 07/07/21 1240  WBC 2.1*   2.2* 2.3* 3.2* 6.9  HGB 9.5*   9.6* 8.1* 10.8* 12.1*  HCT 27.4*   27.4* 22.9* 30.3* 35.9*  PLT 53*   65* 55* 70* 278    COAGS: Recent Labs    07/07/20 1423 05/29/21 1538  INR 1.0 1.2  APTT 34  --     BMP: Recent Labs    06/17/21 0910 06/28/21 0915 06/29/21 0043 06/30/21 0124  NA 137 130* 134* 131*  K 4.3 4.8 4.5 4.7  CL 103 97* 103 98  CO2 '25 23 23 25  ' GLUCOSE 338* 371* 271* 325*  BUN '20 21 16 18  ' CALCIUM 8.6* 9.1 8.4* 8.1*  CREATININE 0.94 1.05 1.09 0.96  GFRNONAA >60 >60 >60 >60    LIVER FUNCTION TESTS: Recent Labs    06/08/21 0402 06/17/21 0910 06/28/21 0915 06/30/21 0124  BILITOT 0.8 0.7 1.3* 1.3*  AST 35 '20 23 19  ' ALT 52* '25 29 22  ' ALKPHOS 86 78 68 70  PROT 6.5 6.0* 7.1 5.9*  ALBUMIN 2.9* 3.4* 3.4* 2.9*    Assessment and Plan: Patient known to IR service from bone marrow biopsy on 06/08/2021 and left upper extremity PICC placement on 06/16/2021.  He has a history of diffuse large B-cell lymphoma and presents today for Port-A-Cath placement to assist with treatment.  He was treated in January of this year for MRSA bacteremia . He underwent removal of right chest wall port and drainage of right axillary abscess (had prior rt axillary lymph node excisional biopsy in December 2022 ) on 06/01/2021 by [facility].  Latest blood cultures are negative and he has completed antibiotic therapy.  He also had COVID infection in January 2023.  Currently denies fever, chills, headache, worsening dyspnea, cough, abdominal pain, nausea, vomiting or bleeding.  He does have left chest wall pain secondary to rib fractures.  Risks and  benefits of image guided port-a-catheter placement was discussed with the patient including, but not limited to bleeding, infection, pneumothorax, or fibrin sheath development and need for additional procedures.  All of the patient's questions were answered, patient is agreeable to proceed. Consent signed and in chart.    Electronically Signed: D. Rowe Robert, PA-C 07/07/2021, 2:18 PM   I spent a total of 20 minutes at the the patient's bedside AND on the patient's hospital floor or unit, greater than 50% of which was counseling/coordinating care for Port-A-Cath placement

## 2021-07-07 NOTE — Procedures (Signed)
Pre Procedure Dx: Poor venous access Post Procedural Dx: Same  Successful placement of right IJ approach port-a-cath with tip at the superior caval atrial junction. The catheter is ready for immediate use.  Estimated Blood Loss: Minimal  Complications: None immediate.  Jay Amilliana Hayworth, MD Pager #: [phone]   

## 2021-07-08 ENCOUNTER — Encounter: Payer: Self-pay | Admitting: Hematology & Oncology

## 2021-07-08 ENCOUNTER — Encounter: Payer: Self-pay | Admitting: *Deleted

## 2021-07-08 ENCOUNTER — Inpatient Hospital Stay: Payer: Medicare Other | Attending: Physician Assistant

## 2021-07-08 ENCOUNTER — Inpatient Hospital Stay: Payer: Medicare Other

## 2021-07-08 ENCOUNTER — Other Ambulatory Visit: Payer: Self-pay

## 2021-07-08 ENCOUNTER — Inpatient Hospital Stay (HOSPITAL_BASED_OUTPATIENT_CLINIC_OR_DEPARTMENT_OTHER): Payer: Medicare Other | Admitting: Hematology & Oncology

## 2021-07-08 VITALS — BP 143/88 | HR 72 | Temp 98.3°F | Resp 17

## 2021-07-08 VITALS — BP 144/86 | HR 75 | Temp 98.8°F | Resp 18 | Wt 174.0 lb

## 2021-07-08 VITALS — BP 144/86 | HR 75 | Temp 98.8°F | Resp 18

## 2021-07-08 DIAGNOSIS — E860 Dehydration: Secondary | ICD-10-CM | POA: Insufficient documentation

## 2021-07-08 DIAGNOSIS — E1101 Type 2 diabetes mellitus with hyperosmolarity with coma: Secondary | ICD-10-CM

## 2021-07-08 DIAGNOSIS — C8338 Diffuse large B-cell lymphoma, lymph nodes of multiple sites: Secondary | ICD-10-CM

## 2021-07-08 DIAGNOSIS — C8330 Diffuse large B-cell lymphoma, unspecified site: Secondary | ICD-10-CM | POA: Insufficient documentation

## 2021-07-08 DIAGNOSIS — D61810 Antineoplastic chemotherapy induced pancytopenia: Secondary | ICD-10-CM | POA: Insufficient documentation

## 2021-07-08 DIAGNOSIS — I10 Essential (primary) hypertension: Secondary | ICD-10-CM | POA: Diagnosis not present

## 2021-07-08 DIAGNOSIS — Z794 Long term (current) use of insulin: Secondary | ICD-10-CM

## 2021-07-08 DIAGNOSIS — Z5189 Encounter for other specified aftercare: Secondary | ICD-10-CM | POA: Diagnosis not present

## 2021-07-08 DIAGNOSIS — I714 Abdominal aortic aneurysm, without rupture, unspecified: Secondary | ICD-10-CM | POA: Diagnosis not present

## 2021-07-08 DIAGNOSIS — Z5112 Encounter for antineoplastic immunotherapy: Secondary | ICD-10-CM | POA: Diagnosis not present

## 2021-07-08 DIAGNOSIS — E1165 Type 2 diabetes mellitus with hyperglycemia: Secondary | ICD-10-CM | POA: Insufficient documentation

## 2021-07-08 DIAGNOSIS — Z5111 Encounter for antineoplastic chemotherapy: Secondary | ICD-10-CM | POA: Insufficient documentation

## 2021-07-08 DIAGNOSIS — E1169 Type 2 diabetes mellitus with other specified complication: Secondary | ICD-10-CM | POA: Diagnosis not present

## 2021-07-08 DIAGNOSIS — Z95828 Presence of other vascular implants and grafts: Secondary | ICD-10-CM

## 2021-07-08 DIAGNOSIS — E7800 Pure hypercholesterolemia, unspecified: Secondary | ICD-10-CM | POA: Diagnosis not present

## 2021-07-08 DIAGNOSIS — Z87891 Personal history of nicotine dependence: Secondary | ICD-10-CM | POA: Insufficient documentation

## 2021-07-08 LAB — CBC WITH DIFFERENTIAL (CANCER CENTER ONLY)
Abs Immature Granulocytes: 0.1 10*3/uL — ABNORMAL HIGH (ref 0.00–0.07)
Basophils Absolute: 0.1 10*3/uL (ref 0.0–0.1)
Basophils Relative: 1 %
Eosinophils Absolute: 0.1 10*3/uL (ref 0.0–0.5)
Eosinophils Relative: 1 %
HCT: 32.4 % — ABNORMAL LOW (ref 39.0–52.0)
Hemoglobin: 10.9 g/dL — ABNORMAL LOW (ref 13.0–17.0)
Immature Granulocytes: 2 %
Lymphocytes Relative: 13 %
Lymphs Abs: 0.8 10*3/uL (ref 0.7–4.0)
MCH: 29.2 pg (ref 26.0–34.0)
MCHC: 33.6 g/dL (ref 30.0–36.0)
MCV: 86.9 fL (ref 80.0–100.0)
Monocytes Absolute: 0.9 10*3/uL (ref 0.1–1.0)
Monocytes Relative: 14 %
Neutro Abs: 4.3 10*3/uL (ref 1.7–7.7)
Neutrophils Relative %: 69 %
Platelet Count: 244 10*3/uL (ref 150–400)
RBC: 3.73 MIL/uL — ABNORMAL LOW (ref 4.22–5.81)
RDW: 18.8 % — ABNORMAL HIGH (ref 11.5–15.5)
WBC Count: 6.3 10*3/uL (ref 4.0–10.5)
nRBC: 0.3 % — ABNORMAL HIGH (ref 0.0–0.2)

## 2021-07-08 LAB — CMP (CANCER CENTER ONLY)
ALT: 14 U/L (ref 0–44)
AST: 15 U/L (ref 15–41)
Albumin: 3.2 g/dL — ABNORMAL LOW (ref 3.5–5.0)
Alkaline Phosphatase: 86 U/L (ref 38–126)
Anion gap: 7 (ref 5–15)
BUN: 19 mg/dL (ref 8–23)
CO2: 27 mmol/L (ref 22–32)
Calcium: 8.6 mg/dL — ABNORMAL LOW (ref 8.9–10.3)
Chloride: 102 mmol/L (ref 98–111)
Creatinine: 0.8 mg/dL (ref 0.61–1.24)
GFR, Estimated: >60 mL/min (ref >60)
Glucose, Bld: 377 mg/dL — ABNORMAL HIGH (ref 70–99)
Potassium: 4 mmol/L (ref 3.5–5.1)
Sodium: 136 mmol/L (ref 135–145)
Total Bilirubin: 0.6 mg/dL (ref 0.3–1.2)
Total Protein: 5.7 g/dL — ABNORMAL LOW (ref 6.5–8.1)

## 2021-07-08 LAB — GLUCOSE, CAPILLARY: Glucose-Capillary: 249 mg/dL — ABNORMAL HIGH (ref 70–99)

## 2021-07-08 LAB — LACTATE DEHYDROGENASE: LDH: 179 U/L (ref 98–192)

## 2021-07-08 MED ORDER — DIPHENHYDRAMINE HCL 25 MG PO CAPS
50.0000 mg | ORAL_CAPSULE | Freq: Once | ORAL | Status: AC
  Administered 2021-07-08: 50 mg via ORAL
  Filled 2021-07-08: qty 2

## 2021-07-08 MED ORDER — SODIUM CHLORIDE 0.9 % IV SOLN
150.0000 mg | Freq: Once | INTRAVENOUS | Status: AC
  Administered 2021-07-08: 150 mg via INTRAVENOUS
  Filled 2021-07-08: qty 150

## 2021-07-08 MED ORDER — PALONOSETRON HCL INJECTION 0.25 MG/5ML
0.2500 mg | Freq: Once | INTRAVENOUS | Status: AC
  Administered 2021-07-08: 0.25 mg via INTRAVENOUS
  Filled 2021-07-08: qty 5

## 2021-07-08 MED ORDER — INSULIN ASPART 100 UNIT/ML IJ SOLN
20.0000 [IU] | Freq: Once | INTRAMUSCULAR | Status: AC
  Administered 2021-07-08: 20 [IU] via SUBCUTANEOUS
  Filled 2021-07-08: qty 0.2

## 2021-07-08 MED ORDER — SODIUM CHLORIDE 0.9% FLUSH
10.0000 mL | Freq: Once | INTRAVENOUS | Status: AC
  Administered 2021-07-08: 10 mL via INTRAVENOUS

## 2021-07-08 MED ORDER — SODIUM CHLORIDE 0.9 % IV SOLN: Freq: Once | INTRAVENOUS | Status: AC

## 2021-07-08 MED ORDER — DOXORUBICIN HCL CHEMO IV INJECTION 2 MG/ML
50.0000 mg/m2 | Freq: Once | INTRAVENOUS | Status: AC
  Administered 2021-07-08: 106 mg via INTRAVENOUS
  Filled 2021-07-08: qty 53

## 2021-07-08 MED ORDER — ACETAMINOPHEN 325 MG PO TABS
650.0000 mg | ORAL_TABLET | Freq: Once | ORAL | Status: AC
  Administered 2021-07-08: 650 mg via ORAL
  Filled 2021-07-08: qty 2

## 2021-07-08 MED ORDER — SODIUM CHLORIDE 0.9 % IV SOLN
750.0000 mg/m2 | Freq: Once | INTRAVENOUS | Status: AC
  Administered 2021-07-08: 1600 mg via INTRAVENOUS
  Filled 2021-07-08: qty 80

## 2021-07-08 MED ORDER — SODIUM CHLORIDE 0.9 % IV SOLN
375.0000 mg/m2 | Freq: Once | INTRAVENOUS | Status: AC
  Administered 2021-07-08: 800 mg via INTRAVENOUS
  Filled 2021-07-08: qty 50

## 2021-07-08 MED ORDER — SODIUM CHLORIDE 0.9 % IV SOLN
10.0000 mg | Freq: Once | INTRAVENOUS | Status: AC
  Administered 2021-07-08: 10 mg via INTRAVENOUS
  Filled 2021-07-08: qty 10

## 2021-07-08 MED ORDER — VINCRISTINE SULFATE CHEMO INJECTION 1 MG/ML
2.0000 mg | Freq: Once | INTRAVENOUS | Status: AC
  Administered 2021-07-08: 2 mg via INTRAVENOUS
  Filled 2021-07-08: qty 2

## 2021-07-08 MED ORDER — HEPARIN SOD (PORK) LOCK FLUSH 100 UNIT/ML IV SOLN
500.0000 [IU] | Freq: Once | INTRAVENOUS | Status: AC | PRN
  Administered 2021-07-08: 500 [IU]

## 2021-07-08 MED ORDER — SODIUM CHLORIDE 0.9% FLUSH
10.0000 mL | INTRAVENOUS | Status: DC | PRN
  Administered 2021-07-08: 10 mL

## 2021-07-08 NOTE — Progress Notes (Signed)
MD reviewed CBC and Cmet "ok to treat despite counts." ?

## 2021-07-08 NOTE — Patient Instructions (Signed)
[facility]  Discharge Instructions: ?Thank you for choosing [facility] to provide your oncology and hematology care.  ? ?If you have a lab appointment with the [facility], please go directly to the [facility] and check in at the registration area. ? ?Wear comfortable clothing and clothing appropriate for easy access to any Portacath or PICC line.  ? ?We strive to give you quality time with your provider. You may need to reschedule your appointment if you arrive late (15 or more minutes).  Arriving late affects you and other patients whose appointments are after yours.  Also, if you miss three or more appointments without notifying the office, you may be dismissed from the clinic at the provider?s discretion.    ?  ?For prescription refill requests, have your pharmacy contact our office and allow 72 hours for refills to be completed.   ? ?Today you received the following chemotherapy and/or immunotherapy agents Rituxan, vincristine, adriamycin, cytoxan    ?  ?To help prevent nausea and vomiting after your treatment, we encourage you to take your nausea medication as directed. ? ?BELOW ARE SYMPTOMS THAT SHOULD BE REPORTED IMMEDIATELY: ?*FEVER GREATER THAN 100.4 F (38 ?C) OR HIGHER ?*CHILLS OR SWEATING ?*NAUSEA AND VOMITING THAT IS NOT CONTROLLED WITH YOUR NAUSEA MEDICATION ?*UNUSUAL SHORTNESS OF BREATH ?*UNUSUAL BRUISING OR BLEEDING ?*URINARY PROBLEMS (pain or burning when urinating, or frequent urination) ?*BOWEL PROBLEMS (unusual diarrhea, constipation, pain near the anus) ?TENDERNESS IN MOUTH AND THROAT WITH OR WITHOUT PRESENCE OF ULCERS (sore throat, sores in mouth, or a toothache) ?UNUSUAL RASH, SWELLING OR PAIN  ?UNUSUAL VAGINAL DISCHARGE OR ITCHING  ? ?Items with * indicate a potential emergency and should be followed up as soon as possible or go to the Emergency Department if any problems should occur. ? ?Please show the CHEMOTHERAPY ALERT CARD or IMMUNOTHERAPY  ALERT CARD at check-in to the Emergency Department and triage nurse. ?Should you have questions after your visit or need to cancel or reschedule your appointment, please contact [facility]  [phone] and follow the prompts.  Office hours are 8:00 a.m. to 4:30 p.m. Monday - Friday. Please note that voicemails left after 4:00 p.m. may not be returned until the following business day.  We are closed weekends and major holidays. You have access to a nurse at all times for urgent questions. Please call the main number to the clinic [phone] and follow the prompts. ? ?For any non-urgent questions, you may also contact your provider using MyChart. We now offer e-Visits for anyone 45 and older to request care online for non-urgent symptoms. For details visit [email or web address]. ?  ?Also download the MyChart app! Go to the app store, search "MyChart", open the app, select [facility], and log in with your MyChart username and password. ? ?Due to Covid, a mask is required upon entering the hospital/clinic. If you do not have a mask, one will be given to you upon arrival. For doctor visits, patients may have 1 support person aged 59 or older with them. For treatment visits, patients cannot have anyone with them due to current Covid guidelines and our immunocompromised population.  ?

## 2021-07-08 NOTE — Progress Notes (Signed)
Patient here for his next treatment. Since his last treatment patient had a fall which resulted in fractured ribs and hospital admission. He had his replacement port placed yesterday.  ? ?Prior to his next treatment he will need a PET scan. Scheduled for 07/26/2021. ? ?Spoke to patient in the treatment room. Reviewed his PET scan with him including date, time and location. He is aware of NPO status, arrival time and insulin holding instructions. All this information also included on Radiology Info Sheet given to patient.  ? ?Oncology Nurse Navigator Documentation ? ?Oncology Nurse Navigator Flowsheets 07/08/2021  ?Abnormal Finding Date -  ?Confirmed Diagnosis Date -  ?Diagnosis Status -  ?Planned Course of Treatment -  ?Phase of Treatment -  ?Chemotherapy Actual Start Date: -  ?Chemotherapy Expected End Date: -  ?Navigator Follow Up Date: 07/26/2021  ?Navigator Follow Up Reason: Scan Review  ?Navigator Location [facility]  ?Navigator Encounter Type Treatment;Appt/Treatment Plan Review  ?Telephone -  ?Treatment Initiated Date -  ?Patient Visit Type MedOnc  ?Treatment Phase Active Tx  ?Barriers/Navigation Needs Coordination of Care;Education  ?Education Other  ?Interventions Coordination of Care;Education;Psycho-Social Support  ?Acuity Level 2-Minimal Needs (1-2 Barriers Identified)  ?Coordination of Care Radiology  ?Education Method Verbal;Written  ?Support Groups/Services Friends and Family  ?Time Spent with Patient 45  ?  ?

## 2021-07-08 NOTE — Progress Notes (Signed)
Hematology and Oncology Follow Up Visit  CARVEL HUSKINS Sr. [id number] 1955-06-30 66 y.o. 07/08/2021   Principle Diagnosis:  Diffuse large B-cell NHL -- IPI = 4 MRSA bacteremia  Current Therapy:   R-CHOP --  s/p cycle #2 -- started on 05/20/2021     Interim History:  Mr. Gudino is back for follow-up.  Since his last treatment, he had a fall.  This was at home.  He fractured ribs over on the left side.  Thankfully, nothing punctured his lung.  He really has had a tough time with chemotherapy.  He thinks that he gets dehydrated very easily.  Because of this, we will plug and had to get him in between treatments so that we give him some IV fluid.  His blood sugar today is 377.  We will give him some insulin today.  He has had no mouth sores.  He has a Port-A-Cath in.  We had to have the initial Port-A-Cath taken out because of MRSA bacteremia from a right axillary abscess.  This has not been a problem for [place].  He has had no issues with diarrhea.  He does have a indwelling Foley catheter.  This is post come out on Wednesday.  He has had no rashes.  There is been no leg swelling.  He has had no headache.  Overall, his performance status is ECOG 1.   Medications:  Current Outpatient Medications:    acetaminophen (TYLENOL) 325 MG tablet, Take 650 mg by mouth every 6 (six) hours as needed for mild pain or headache., Disp: , Rfl:    albuterol (PROAIR HFA) 108 (90 Base) MCG/ACT inhaler, Inhale 1-2 puffs into the lungs every 6 (six) hours as needed for wheezing or shortness of breath., Disp: 8 g, Rfl: 6   alendronate (FOSAMAX) 70 MG tablet, Take 70 mg by mouth every Sunday., Disp: , Rfl:    allopurinol (ZYLOPRIM) 100 MG tablet, Take 1 tablet (100 mg total) by mouth daily. Start 3 days BEFORE chemotherapy starts (Patient taking differently: Take 100 mg by mouth daily.), Disp: 30 tablet, Rfl: 0   aspirin EC 81 MG EC tablet, Take 1 tablet (81 mg total) by mouth daily at 6 (six) AM. Swallow  whole. (Patient taking differently: Take 81 mg by mouth in the morning. Swallow whole.), Disp: 30 tablet, Rfl: 11   atenolol (TENORMIN) 100 MG tablet, Take 100 mg by mouth daily., Disp: , Rfl:    blood glucose meter kit and supplies KIT, Dispense based on patient and insurance preference. Use up to four times daily as directed., Disp: 1 each, Rfl: 0   calcium carbonate (OSCAL) 1500 (600 Ca) MG TABS tablet, Take 600 mg of elemental calcium by mouth daily with breakfast., Disp: , Rfl:    Continuous Blood Gluc Receiver (FREESTYLE LIBRE 2 READER) [place], , Disp: , Rfl:    Continuous Blood Gluc Sensor (FREESTYLE LIBRE 14 DAY SENSOR) MISC, Inject 1 Device into the skin every 14 (fourteen) days., Disp: , Rfl:    diclofenac Sodium (VOLTAREN) 1 % GEL, Apply 2 g topically daily as needed (to painful sites)., Disp: , Rfl:    DULoxetine (CYMBALTA) 60 MG capsule, Take 60 mg by mouth at bedtime., Disp: , Rfl:    famciclovir (FAMVIR) 250 MG tablet, Take 1 tablet (250 mg total) by mouth 2 (two) times daily. (Patient not taking: Reported on 07/06/2021), Disp: 30 tablet, Rfl: 8   Fish Oil OIL, Take 1,500 mg by mouth daily., Disp: , Rfl:  glimepiride (AMARYL) 2 MG tablet, Take 2 mg by mouth 2 (two) times daily., Disp: , Rfl:    insulin glargine (LANTUS) 100 UNIT/ML Solostar Pen, Inject 25 Units into the skin daily., Disp: 15 mL, Rfl: 11   insulin lispro (HUMALOG) 100 UNIT/ML KwikPen, Inject 2-15 Units into the skin 4 (four) times daily - after meals and at bedtime. Glucose 121 - 150: 2 units, Glucose 151 - 200: 3 units, Glucose 201 - 250: 5 units, Glucose 251 - 300: 8 units, Glucose 301 - 350: 11 units, Glucose 351 - 400: 15 units, Glucose > 400 call MD, Disp: 15 mL, Rfl: 11   Insulin Pen Needle 32G X 4 MM MISC, 1 each by Does not apply route 4 (four) times daily - after meals and at bedtime., Disp: 100 each, Rfl: 5   LORazepam (ATIVAN) 0.5 MG tablet, Take 1 tablet (0.5 mg total) by mouth every 8 (eight) hours. Take for  nausea prn (Patient taking differently: Take 0.5 mg by mouth every 8 (eight) hours as needed for anxiety (or nausea).), Disp: 30 tablet, Rfl: 1   omeprazole (PRILOSEC) 40 MG capsule, Take 40 mg by mouth every evening., Disp: , Rfl:    ondansetron (ZOFRAN) 8 MG tablet, Take 1 tablet (8 mg total) by mouth 2 (two) times daily as needed for refractory nausea / vomiting. Start on day 3 after cyclophosphamide chemotherapy., Disp: 30 tablet, Rfl: 1   potassium chloride SA (KLOR-CON M20) 20 MEQ tablet, TAKE 1 TABLET (20 MEQ TOTAL) BY MOUTH DAILY.  (Patient taking differently: Take 20 mEq by mouth in the morning.), Disp: 30 tablet, Rfl: 5   prochlorperazine (COMPAZINE) 10 MG tablet, Take 1 tablet (10 mg total) by mouth every 6 (six) hours as needed (Nausea or vomiting). (Patient taking differently: Take 10 mg by mouth every 6 (six) hours as needed for vomiting or nausea.), Disp: 30 tablet, Rfl: 6   rosuvastatin (CRESTOR) 10 MG tablet, Take 10 mg by mouth daily., Disp: , Rfl:    SEREVENT DISKUS 50 MCG/ACT diskus inhaler, Inhale 1 puff into the lungs 2 (two) times daily., Disp: , Rfl:    tadalafil (CIALIS) 5 MG tablet, Take 5 mg by mouth in the morning., Disp: , Rfl:    tamsulosin (FLOMAX) 0.4 MG CAPS capsule, Take 0.4 mg by mouth every evening., Disp: , Rfl:    Zinc 50 MG TABS, Take 50 mg by mouth daily., Disp: , Rfl:   Allergies:  Allergies  Allergen Reactions   Fexofenadine Other (See Comments)    "dries me out too much"   Hydrochlorothiazide Other (See Comments)    Cramps   Penicillins Hives   Pravastatin Sodium Other (See Comments)    Aches   Requip [Ropinirole Hcl] Other (See Comments)    Bad dreams    Past Medical History, Surgical history, Social history, and Family History were reviewed and updated.  Review of Systems: Review of Systems  Constitutional: Negative.   HENT:  Negative.    Eyes: Negative.   Respiratory: Negative.    Cardiovascular: Negative.   Gastrointestinal: Negative.    Endocrine: Negative.   Genitourinary: Negative.    Musculoskeletal: Negative.   Skin: Negative.   Neurological: Negative.   Hematological: Negative.   Psychiatric/Behavioral: Negative.     Physical Exam:  weight is 174 lb (78.9 kg). His oral temperature is 98.8 F (37.1 C). His blood pressure is 144/86 (abnormal) and his pulse is 75. His respiration is 18 and oxygen saturation is 100%.  Wt Readings from Last 3 Encounters:  07/08/21 174 lb (78.9 kg)  07/06/21 176 lb (79.8 kg)  06/30/21 178 lb 9.2 oz (81 kg)  His vital signs show temperature of 98.6.  Pulse 72.  Blood pressure 103/62.  Weight is 180 pounds.  Physical Exam Vitals reviewed.  HENT:     Head: Normocephalic and atraumatic.  Eyes:     Pupils: Pupils are equal, round, and reactive to light.  Cardiovascular:     Rate and Rhythm: Normal rate and regular rhythm.     Heart sounds: Normal heart sounds.  Pulmonary:     Effort: Pulmonary effort is normal.     Breath sounds: Normal breath sounds.  Abdominal:     General: Bowel sounds are normal.     Palpations: Abdomen is soft.  Musculoskeletal:        General: No tenderness or deformity. Normal range of motion.     Cervical back: Normal range of motion.  Lymphadenopathy:     Cervical: No cervical adenopathy.  Skin:    General: Skin is warm and dry.     Findings: No erythema or rash.  Neurological:     Mental Status: He is alert and oriented to person, place, and time.  Psychiatric:        Behavior: Behavior normal.        Thought Content: Thought content normal.        Judgment: Judgment normal.     Lab Results  Component Value Date   WBC 6.3 07/08/2021   HGB 10.9 (L) 07/08/2021   HCT 32.4 (L) 07/08/2021   MCV 86.9 07/08/2021   PLT 244 07/08/2021     Chemistry      Component Value Date/Time   NA 136 07/08/2021 0801   K 4.0 07/08/2021 0801   CL 102 07/08/2021 0801   CO2 27 07/08/2021 0801   BUN 19 07/08/2021 0801   CREATININE 0.80 07/08/2021 0801       Component Value Date/Time   CALCIUM 8.6 (L) 07/08/2021 0801   ALKPHOS 86 07/08/2021 0801   AST 15 07/08/2021 0801   ALT 14 07/08/2021 0801   BILITOT 0.6 07/08/2021 0801      Impression and Plan: Mr. Kloss is a very nice 66 year old white male.  He has diffuse large cell non-Hodgkin's lymphoma.  He has extra nodal disease.  Again, he has a fairly high IPI score.  We will go ahead with his third cycle of treatment.  After this, we will then do a PET scan on him.  We will see what counter response he has had.  Hopefully, he will have a complete remission.  We will have him come in next week for IV fluids.  If we need to, we will have him come in the following week also for some IV fluids.  We will still treat him every 3 weeks.  We will get the PET scan about 3 days before we treat him.  His blood sugars will always be a problem.  Since he is on intermittent prednisone, it is no surprise that his blood sugars go up.    [name], MD 3/3/20239:07 AM

## 2021-07-09 ENCOUNTER — Other Ambulatory Visit: Payer: Self-pay | Admitting: Hematology & Oncology

## 2021-07-11 ENCOUNTER — Inpatient Hospital Stay: Payer: Medicare Other

## 2021-07-11 ENCOUNTER — Other Ambulatory Visit: Payer: Self-pay

## 2021-07-11 ENCOUNTER — Encounter: Payer: Self-pay | Admitting: Hematology & Oncology

## 2021-07-11 VITALS — BP 143/79 | HR 68 | Temp 98.1°F | Resp 18

## 2021-07-11 DIAGNOSIS — Z5111 Encounter for antineoplastic chemotherapy: Secondary | ICD-10-CM | POA: Diagnosis not present

## 2021-07-11 DIAGNOSIS — D61810 Antineoplastic chemotherapy induced pancytopenia: Secondary | ICD-10-CM | POA: Diagnosis not present

## 2021-07-11 DIAGNOSIS — Z87891 Personal history of nicotine dependence: Secondary | ICD-10-CM | POA: Diagnosis not present

## 2021-07-11 DIAGNOSIS — Z794 Long term (current) use of insulin: Secondary | ICD-10-CM

## 2021-07-11 DIAGNOSIS — Z5189 Encounter for other specified aftercare: Secondary | ICD-10-CM | POA: Diagnosis not present

## 2021-07-11 DIAGNOSIS — I714 Abdominal aortic aneurysm, without rupture, unspecified: Secondary | ICD-10-CM | POA: Diagnosis not present

## 2021-07-11 DIAGNOSIS — E860 Dehydration: Secondary | ICD-10-CM | POA: Diagnosis not present

## 2021-07-11 DIAGNOSIS — Z5112 Encounter for antineoplastic immunotherapy: Secondary | ICD-10-CM | POA: Diagnosis not present

## 2021-07-11 DIAGNOSIS — C8338 Diffuse large B-cell lymphoma, lymph nodes of multiple sites: Secondary | ICD-10-CM

## 2021-07-11 DIAGNOSIS — E1101 Type 2 diabetes mellitus with hyperosmolarity with coma: Secondary | ICD-10-CM

## 2021-07-11 DIAGNOSIS — E1165 Type 2 diabetes mellitus with hyperglycemia: Secondary | ICD-10-CM | POA: Diagnosis not present

## 2021-07-11 DIAGNOSIS — C8330 Diffuse large B-cell lymphoma, unspecified site: Secondary | ICD-10-CM | POA: Diagnosis not present

## 2021-07-11 MED ORDER — PEGFILGRASTIM-BMEZ 6 MG/0.6ML ~~LOC~~ SOSY
6.0000 mg | PREFILLED_SYRINGE | Freq: Once | SUBCUTANEOUS | Status: AC
  Administered 2021-07-11: 6 mg via SUBCUTANEOUS
  Filled 2021-07-11: qty 0.6

## 2021-07-11 MED ORDER — SODIUM CHLORIDE 0.9 % IV SOLN: Freq: Once | INTRAVENOUS | Status: AC

## 2021-07-11 NOTE — Patient Instructions (Signed)

## 2021-07-14 DIAGNOSIS — R338 Other retention of urine: Secondary | ICD-10-CM | POA: Diagnosis not present

## 2021-07-18 ENCOUNTER — Inpatient Hospital Stay: Payer: Medicare Other

## 2021-07-18 ENCOUNTER — Other Ambulatory Visit: Payer: Self-pay

## 2021-07-18 ENCOUNTER — Inpatient Hospital Stay (HOSPITAL_BASED_OUTPATIENT_CLINIC_OR_DEPARTMENT_OTHER): Payer: Medicare Other | Admitting: Hematology & Oncology

## 2021-07-18 ENCOUNTER — Other Ambulatory Visit (HOSPITAL_BASED_OUTPATIENT_CLINIC_OR_DEPARTMENT_OTHER): Payer: Self-pay

## 2021-07-18 ENCOUNTER — Encounter: Payer: Self-pay | Admitting: Hematology & Oncology

## 2021-07-18 ENCOUNTER — Other Ambulatory Visit: Payer: Self-pay | Admitting: *Deleted

## 2021-07-18 VITALS — BP 83/60 | HR 82 | Temp 97.8°F | Resp 24 | Wt 174.0 lb

## 2021-07-18 VITALS — BP 84/41 | HR 82

## 2021-07-18 DIAGNOSIS — C8338 Diffuse large B-cell lymphoma, lymph nodes of multiple sites: Secondary | ICD-10-CM

## 2021-07-18 DIAGNOSIS — N3000 Acute cystitis without hematuria: Secondary | ICD-10-CM | POA: Diagnosis not present

## 2021-07-18 DIAGNOSIS — C8330 Diffuse large B-cell lymphoma, unspecified site: Secondary | ICD-10-CM | POA: Diagnosis not present

## 2021-07-18 DIAGNOSIS — R338 Other retention of urine: Secondary | ICD-10-CM | POA: Diagnosis not present

## 2021-07-18 DIAGNOSIS — Z5189 Encounter for other specified aftercare: Secondary | ICD-10-CM | POA: Diagnosis not present

## 2021-07-18 DIAGNOSIS — I714 Abdominal aortic aneurysm, without rupture, unspecified: Secondary | ICD-10-CM | POA: Diagnosis not present

## 2021-07-18 DIAGNOSIS — R3915 Urgency of urination: Secondary | ICD-10-CM | POA: Diagnosis not present

## 2021-07-18 DIAGNOSIS — E860 Dehydration: Secondary | ICD-10-CM

## 2021-07-18 DIAGNOSIS — Z87891 Personal history of nicotine dependence: Secondary | ICD-10-CM | POA: Diagnosis not present

## 2021-07-18 DIAGNOSIS — Z5112 Encounter for antineoplastic immunotherapy: Secondary | ICD-10-CM | POA: Diagnosis not present

## 2021-07-18 DIAGNOSIS — Z5111 Encounter for antineoplastic chemotherapy: Secondary | ICD-10-CM | POA: Diagnosis not present

## 2021-07-18 DIAGNOSIS — E1165 Type 2 diabetes mellitus with hyperglycemia: Secondary | ICD-10-CM | POA: Diagnosis not present

## 2021-07-18 DIAGNOSIS — D61810 Antineoplastic chemotherapy induced pancytopenia: Secondary | ICD-10-CM | POA: Diagnosis not present

## 2021-07-18 DIAGNOSIS — Z794 Long term (current) use of insulin: Secondary | ICD-10-CM | POA: Diagnosis not present

## 2021-07-18 LAB — CBC WITH DIFFERENTIAL (CANCER CENTER ONLY)
Abs Immature Granulocytes: 0.26 10*3/uL — ABNORMAL HIGH (ref 0.00–0.07)
Basophils Absolute: 0.1 10*3/uL (ref 0.0–0.1)
Basophils Relative: 2 %
Eosinophils Absolute: 0 10*3/uL (ref 0.0–0.5)
Eosinophils Relative: 0 %
HCT: 21.2 % — ABNORMAL LOW (ref 39.0–52.0)
Hemoglobin: 7.3 g/dL — ABNORMAL LOW (ref 13.0–17.0)
Immature Granulocytes: 9 %
Lymphocytes Relative: 8 %
Lymphs Abs: 0.2 10*3/uL — ABNORMAL LOW (ref 0.7–4.0)
MCH: 29.4 pg (ref 26.0–34.0)
MCHC: 34.4 g/dL (ref 30.0–36.0)
MCV: 85.5 fL (ref 80.0–100.0)
Monocytes Absolute: 0.5 10*3/uL (ref 0.1–1.0)
Monocytes Relative: 16 %
Neutro Abs: 1.9 10*3/uL (ref 1.7–7.7)
Neutrophils Relative %: 65 %
Platelet Count: 70 10*3/uL — ABNORMAL LOW (ref 150–400)
RBC: 2.48 MIL/uL — ABNORMAL LOW (ref 4.22–5.81)
RDW: 16.8 % — ABNORMAL HIGH (ref 11.5–15.5)
Smear Review: NORMAL
WBC Count: 2.9 10*3/uL — ABNORMAL LOW (ref 4.0–10.5)
nRBC: 2.4 % — ABNORMAL HIGH (ref 0.0–0.2)

## 2021-07-18 LAB — CMP (CANCER CENTER ONLY)
ALT: 15 U/L (ref 0–44)
AST: 9 U/L — ABNORMAL LOW (ref 15–41)
Albumin: 3.3 g/dL — ABNORMAL LOW (ref 3.5–5.0)
Alkaline Phosphatase: 61 U/L (ref 38–126)
Anion gap: 8 (ref 5–15)
BUN: 20 mg/dL (ref 8–23)
CO2: 22 mmol/L (ref 22–32)
Calcium: 8 mg/dL — ABNORMAL LOW (ref 8.9–10.3)
Chloride: 95 mmol/L — ABNORMAL LOW (ref 98–111)
Creatinine: 0.93 mg/dL (ref 0.61–1.24)
GFR, Estimated: >60 mL/min (ref >60)
Glucose, Bld: 355 mg/dL — ABNORMAL HIGH (ref 70–99)
Potassium: 3.8 mmol/L (ref 3.5–5.1)
Sodium: 125 mmol/L — ABNORMAL LOW (ref 135–145)
Total Bilirubin: 0.7 mg/dL (ref 0.3–1.2)
Total Protein: 5.1 g/dL — ABNORMAL LOW (ref 6.5–8.1)

## 2021-07-18 LAB — SAMPLE TO BLOOD BANK

## 2021-07-18 LAB — LACTATE DEHYDROGENASE: LDH: 143 U/L (ref 98–192)

## 2021-07-18 LAB — PREPARE RBC (CROSSMATCH)

## 2021-07-18 MED ORDER — SODIUM CHLORIDE 0.9 % IV SOLN: INTRAVENOUS | Status: DC

## 2021-07-18 MED ORDER — INSULIN ASPART 100 UNIT/ML IJ SOLN
15.0000 [IU] | Freq: Once | INTRAMUSCULAR | Status: AC
  Administered 2021-07-18: 15 [IU] via SUBCUTANEOUS
  Filled 2021-07-18: qty 0.15

## 2021-07-18 MED ORDER — HEPARIN SOD (PORK) LOCK FLUSH 100 UNIT/ML IV SOLN
500.0000 [IU] | Freq: Once | INTRAVENOUS | Status: AC
  Administered 2021-07-18: 500 [IU] via INTRAVENOUS

## 2021-07-18 MED ORDER — SODIUM CHLORIDE 0.9% FLUSH
10.0000 mL | Freq: Once | INTRAVENOUS | Status: AC
  Administered 2021-07-18: 10 mL via INTRAVENOUS

## 2021-07-18 MED ORDER — DOXYCYCLINE HYCLATE 100 MG PO TABS
100.0000 mg | ORAL_TABLET | Freq: Two times a day (BID) | ORAL | 0 refills | Status: DC
  Filled 2021-07-18: qty 20, 10d supply, fill #0

## 2021-07-18 NOTE — Progress Notes (Signed)
Hematology and Oncology Follow Up Visit  Aaron OEHLERT Sr. [id number] 1956/01/07 66 y.o. 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  He was in getting IV fluids today.  He is IV fluids because he gets very dehydrated after treatment.  He is complaining of his left elbow being swollen and somewhat red.  I took a look at this.  There is some amount of fluctuance with the elbow.  He has a healing eschar on the elbow.  He may have some bursitis.  Unfortunate, we really do not have the IV antibiotics in the office.  For right now, we will to try him on some oral antibiotic.  I will have to try him on some doxycycline to see if this may help.  He has a penicillin allergy.  He clearly is going to need some a blood.  His hemoglobin is 7.3.  This clearly is going to drop after he gets hydrated.  I talked him about this.  He has had blood on the hospital.  He knows about the transfusions.  He is definitely agreeable.  He would also need to have some insulin.  His blood sugar today is 355.  This is always been quite high for him.  I hope that we can try to avoid having to open up the left elbow.  Hopefully, the antibiotics will be able to help a little bit.  He has had no fever.  He has had no diarrhea.  He has had no obvious nausea or vomiting.  Currently, I would say his performance status is ECOG 1.     Medications:  Current Outpatient Medications:    acetaminophen (TYLENOL) 325 MG tablet, Take 650 mg by mouth every 6 (six) hours as needed for mild pain or headache., Disp: , Rfl:    albuterol (PROAIR HFA) 108 (90 Base) MCG/ACT inhaler, Inhale 1-2 puffs into the lungs every 6 (six) hours as needed for wheezing or shortness of breath., Disp: 8 g, Rfl: 6   alendronate (FOSAMAX) 70 MG tablet, Take 70 mg  by mouth every Sunday., Disp: , Rfl:    allopurinol (ZYLOPRIM) 100 MG tablet, Take 1 tablet (100 mg total) by mouth daily. Start 3 days BEFORE chemotherapy starts (Patient taking differently: Take 100 mg by mouth daily.), Disp: 30 tablet, Rfl: 0   aspirin EC 81 MG EC tablet, Take 1 tablet (81 mg total) by mouth daily at 6 (six) AM. Swallow whole. (Patient taking differently: Take 81 mg by mouth in the morning. Swallow whole.), Disp: 30 tablet, Rfl: 11   atenolol (TENORMIN) 100 MG tablet, Take 100 mg by mouth daily., Disp: , Rfl:    blood glucose meter kit and supplies KIT, Dispense based on patient and insurance preference. Use up to four times daily as directed., Disp: 1 each, Rfl: 0   calcium carbonate (OSCAL) 1500 (600 Ca) MG TABS tablet, Take 600 mg of elemental calcium by mouth daily with breakfast., Disp: , Rfl:    Continuous Blood Gluc Receiver (FREESTYLE LIBRE 2 READER) DEVI, , Disp: , Rfl:    Continuous Blood Gluc Sensor (FREESTYLE LIBRE 14 DAY SENSOR) MISC, Inject 1 Device into the skin every 14 (fourteen) days., Disp: , Rfl:    diclofenac Sodium (VOLTAREN) 1 % GEL, Apply  2 g topically daily as needed (to painful sites)., Disp: , Rfl:    doxycycline (VIBRA-TABS) 100 MG tablet, Take 1 tablet (100 mg total) by mouth 2 (two) times daily., Disp: 20 tablet, Rfl: 0   DULoxetine (CYMBALTA) 60 MG capsule, Take 60 mg by mouth at bedtime., Disp: , Rfl:    famciclovir (FAMVIR) 250 MG tablet, Take 1 tablet (250 mg total) by mouth 2 (two) times daily., Disp: 30 tablet, Rfl: 8   Fish Oil OIL, Take 1,500 mg by mouth daily., Disp: , Rfl:    insulin glargine (LANTUS) 100 UNIT/ML Solostar Pen, Inject 25 Units into the skin daily., Disp: 15 mL, Rfl: 11   insulin lispro (HUMALOG) 100 UNIT/ML KwikPen, Inject 2-15 Units into the skin 4 (four) times daily - after meals and at bedtime. Glucose 121 - 150: 2 units, Glucose 151 - 200: 3 units, Glucose 201 - 250: 5 units, Glucose 251 - 300: 8 units, Glucose 301 - 350:  11 units, Glucose 351 - 400: 15 units, Glucose > 400 call MD, Disp: 15 mL, Rfl: 11   Insulin Pen Needle 32G X 4 MM MISC, 1 each by Does not apply route 4 (four) times daily - after meals and at bedtime., Disp: 100 each, Rfl: 5   nitrofurantoin, macrocrystal-monohydrate, (MACROBID) 100 MG capsule, Take 100 mg by mouth 2 (two) times daily., Disp: , Rfl:    omeprazole (PRILOSEC) 40 MG capsule, Take 40 mg by mouth every evening., Disp: , Rfl:    potassium chloride SA (KLOR-CON M20) 20 MEQ tablet, TAKE 1 TABLET (20 MEQ TOTAL) BY MOUTH DAILY.  (Patient taking differently: Take 20 mEq by mouth in the morning.), Disp: 30 tablet, Rfl: 5   rosuvastatin (CRESTOR) 10 MG tablet, Take 10 mg by mouth daily., Disp: , Rfl:    SEREVENT DISKUS 50 MCG/ACT diskus inhaler, Inhale 1 puff into the lungs 2 (two) times daily., Disp: , Rfl:    tadalafil (CIALIS) 5 MG tablet, Take 5 mg by mouth in the morning., Disp: , Rfl:    tamsulosin (FLOMAX) 0.4 MG CAPS capsule, Take 0.4 mg by mouth every evening., Disp: , Rfl:    Zinc 50 MG TABS, Take 50 mg by mouth daily., Disp: , Rfl:    fluconazole (DIFLUCAN) 150 MG tablet, Take 150 mg by mouth once., Disp: , Rfl:    LORazepam (ATIVAN) 0.5 MG tablet, Take 1 tablet (0.5 mg total) by mouth every 8 (eight) hours. Take for nausea prn (Patient not taking: Reported on 07/18/2021), Disp: 30 tablet, Rfl: 1   ondansetron (ZOFRAN) 8 MG tablet, Take 1 tablet (8 mg total) by mouth 2 (two) times daily as needed for refractory nausea / vomiting. Start on day 3 after cyclophosphamide chemotherapy. (Patient not taking: Reported on 07/18/2021), Disp: 30 tablet, Rfl: 1   prochlorperazine (COMPAZINE) 10 MG tablet, Take 1 tablet (10 mg total) by mouth every 6 (six) hours as needed (Nausea or vomiting). (Patient not taking: Reported on 07/18/2021), Disp: 30 tablet, Rfl: 6 No current facility-administered medications for this visit.  Facility-Administered Medications Ordered in Other Visits:    0.9 %  sodium  chloride infusion, , Intravenous, Continuous, Viviano Bir, Rudell Cobb, MD, Last Rate: 750 mL/hr at 07/18/21 1209, New Bag at 07/18/21 1209  Allergies:  Allergies  Allergen Reactions   Fexofenadine Other (See Comments)    "dries me out too much"   Hydrochlorothiazide Other (See Comments)    Cramps   Penicillins Hives   Pravastatin Sodium Other (See  Comments)    Aches   Requip [Ropinirole Hcl] Other (See Comments)    Bad dreams    Past Medical History, Surgical history, Social history, and Family History were reviewed and updated.  Review of Systems: Review of Systems  Constitutional: Negative.   HENT:  Negative.    Eyes: Negative.   Respiratory: Negative.    Cardiovascular: Negative.   Gastrointestinal: Negative.   Endocrine: Negative.   Genitourinary: Negative.    Musculoskeletal: Negative.   Skin: Negative.   Neurological: Negative.   Hematological: Negative.   Psychiatric/Behavioral: Negative.     Physical Exam:  weight is 174 lb (78.9 kg). His oral temperature is 97.8 F (36.6 C). His blood pressure is 83/60 (abnormal) and his pulse is 82. His respiration is 24 (abnormal) and oxygen saturation is 100%.   Wt Readings from Last 3 Encounters:  07/18/21 174 lb (78.9 kg)  07/08/21 174 lb (78.9 kg)  07/06/21 176 lb (79.8 kg)  His vital signs show temperature of 98.6.  Pulse 72.  Blood pressure 103/62.  Weight is 180 pounds.  Physical Exam Vitals reviewed.  HENT:     Head: Normocephalic and atraumatic.  Eyes:     Pupils: Pupils are equal, round, and reactive to light.  Cardiovascular:     Rate and Rhythm: Normal rate and regular rhythm.     Heart sounds: Normal heart sounds.  Pulmonary:     Effort: Pulmonary effort is normal.     Breath sounds: Normal breath sounds.  Abdominal:     General: Bowel sounds are normal.     Palpations: Abdomen is soft.  Musculoskeletal:        General: No tenderness or deformity. Normal range of motion.     Cervical back: Normal range of  motion.     Comments: Evaluation of her left elbow does show some fluctuance.  There is some erythema.  There is slight tenderness to palpation.  There is no exudate from the eschar.  He has decent range of motion of the elbow.  Lymphadenopathy:     Cervical: No cervical adenopathy.  Skin:    General: Skin is warm and dry.     Findings: No erythema or rash.  Neurological:     Mental Status: He is alert and oriented to person, place, and time.  Psychiatric:        Behavior: Behavior normal.        Thought Content: Thought content normal.        Judgment: Judgment normal.     Lab Results  Component Value Date   WBC 2.9 (L) 07/18/2021   HGB 7.3 (L) 07/18/2021   HCT 21.2 (L) 07/18/2021   MCV 85.5 07/18/2021   PLT 70 (L) 07/18/2021     Chemistry      Component Value Date/Time   NA 125 (L) 07/18/2021 1324   K 3.8 07/18/2021 1324   CL 95 (L) 07/18/2021 1324   CO2 22 07/18/2021 1324   BUN 20 07/18/2021 1324   CREATININE 0.93 07/18/2021 1324      Component Value Date/Time   CALCIUM 8.0 (L) 07/18/2021 1324   ALKPHOS 61 07/18/2021 1324   AST 9 (L) 07/18/2021 1324   ALT 15 07/18/2021 1324   BILITOT 0.7 07/18/2021 1324      Impression and Plan: Mr. Lamountain is a very nice 66 year old white male.  He has diffuse large cell non-Hodgkin's lymphoma.  He has extra nodal disease.  Again, he has a fairly high  IPI score.  He has had 3 cycles of chemotherapy with R-CHOP.  Her problem right now is this left elbow.  Hopefully, we can try to get this under control before he has any kind of interventions with Orthopedic Surgery.  He clearly needs to be transfused.  We will see about 2 units of blood tomorrow.  We will give him some insulin today.  This is certainly complicated.  We will have to watch closely and see how everything goes with him.  I probably had repeat some labs done tomorrow.    [name], MD 3/13/20232:18 PM

## 2021-07-18 NOTE — Patient Instructions (Signed)

## 2021-07-19 ENCOUNTER — Inpatient Hospital Stay: Payer: Medicare Other

## 2021-07-19 DIAGNOSIS — E860 Dehydration: Secondary | ICD-10-CM | POA: Diagnosis not present

## 2021-07-19 DIAGNOSIS — Z5189 Encounter for other specified aftercare: Secondary | ICD-10-CM | POA: Diagnosis not present

## 2021-07-19 DIAGNOSIS — Z794 Long term (current) use of insulin: Secondary | ICD-10-CM | POA: Diagnosis not present

## 2021-07-19 DIAGNOSIS — D61810 Antineoplastic chemotherapy induced pancytopenia: Secondary | ICD-10-CM | POA: Diagnosis not present

## 2021-07-19 DIAGNOSIS — Z5111 Encounter for antineoplastic chemotherapy: Secondary | ICD-10-CM | POA: Diagnosis not present

## 2021-07-19 DIAGNOSIS — Z87891 Personal history of nicotine dependence: Secondary | ICD-10-CM | POA: Diagnosis not present

## 2021-07-19 DIAGNOSIS — E1165 Type 2 diabetes mellitus with hyperglycemia: Secondary | ICD-10-CM | POA: Diagnosis not present

## 2021-07-19 DIAGNOSIS — I714 Abdominal aortic aneurysm, without rupture, unspecified: Secondary | ICD-10-CM | POA: Diagnosis not present

## 2021-07-19 DIAGNOSIS — C8338 Diffuse large B-cell lymphoma, lymph nodes of multiple sites: Secondary | ICD-10-CM

## 2021-07-19 DIAGNOSIS — Z5112 Encounter for antineoplastic immunotherapy: Secondary | ICD-10-CM | POA: Diagnosis not present

## 2021-07-19 DIAGNOSIS — C8330 Diffuse large B-cell lymphoma, unspecified site: Secondary | ICD-10-CM | POA: Diagnosis not present

## 2021-07-19 MED ORDER — SODIUM CHLORIDE 0.9% FLUSH
10.0000 mL | INTRAVENOUS | Status: AC | PRN
  Administered 2021-07-19: 10 mL

## 2021-07-19 MED ORDER — HEPARIN SOD (PORK) LOCK FLUSH 100 UNIT/ML IV SOLN
500.0000 [IU] | Freq: Every day | INTRAVENOUS | Status: AC | PRN
  Administered 2021-07-19: 500 [IU]

## 2021-07-19 MED ORDER — SODIUM CHLORIDE 0.9% IV SOLUTION: 250.0000 mL | Freq: Once | INTRAVENOUS | Status: DC

## 2021-07-19 MED ORDER — HEPARIN SOD (PORK) LOCK FLUSH 100 UNIT/ML IV SOLN: 250.0000 [IU] | INTRAVENOUS | Status: DC | PRN

## 2021-07-19 MED ORDER — SODIUM CHLORIDE 0.9% FLUSH: 3.0000 mL | INTRAVENOUS | Status: DC | PRN

## 2021-07-19 NOTE — Patient Instructions (Signed)
Blood Transfusion, Adult °A blood transfusion is a procedure in which you receive blood through an IV tube. You may need this procedure because of: °A bleeding disorder. °An illness. °An injury. °A surgery. °The blood may come from someone else (a donor). You may also be able to donate blood for yourself. The blood given in a transfusion is made up of different types of cells. You may get: °Red blood cells. These carry oxygen to the cells in the body. °White blood cells. These help you fight infections. °Platelets. These help your blood to clot. °Plasma. This is the liquid part of your blood. It carries proteins and other substances through the body. °If you have a clotting disorder, you may also get other types of blood products. °Tell your doctor about: °Any blood disorders you have. °Any reactions you have had during a blood transfusion in the past. °Any allergies you have. °All medicines you are taking, including vitamins, herbs, eye drops, creams, and over-the-counter medicines. °Any surgeries you have had. °Any medical conditions you have. This includes any recent fever or cold symptoms. °Whether you are pregnant or may be pregnant. °What are the risks? °Generally, this is a safe procedure. However, problems may occur. °The most common problems include: °A mild allergic reaction. This includes red, swollen areas of skin (hives) and itching. °Fever or chills. This may be the body's response to new blood cells received. This may happen during or up to 4 hours after the transfusion. °More serious problems may include: °Too much fluid in the lungs. This may cause breathing problems. °A serious allergic reaction. This includes breathing trouble or swelling around the face and lips. °Lung injury. This causes breathing trouble and low oxygen in the blood. This can happen within hours of the transfusion or days later. °Too much iron. This can happen after getting many blood transfusions over a period of time. °An  infection or virus passed through the blood. This is rare. Donated blood is carefully tested before it is given. °Your body's defense system (immune system) trying to attack the new blood cells. This is rare. Symptoms may include fever, chills, nausea, low blood pressure, and low back or chest pain. °Donated cells attacking healthy tissues. This is rare. °What happens before the procedure? °Medicines °Ask your doctor about: °Changing or stopping your normal medicines. This is important. °Taking aspirin and ibuprofen. Do not take these medicines unless your doctor tells you to take them. °Taking over-the-counter medicines, vitamins, herbs, and supplements. °General instructions °Follow instructions from your doctor about what you cannot eat or drink. °You will have a blood test to find out your blood type. The test also finds out what type of blood your body will accept and matches it to the donor type. °If you are going to have a planned surgery, you may be able to donate your own blood. This may be done in case you need a transfusion. °You will have your temperature, blood pressure, and pulse checked. °You may receive medicine to help prevent an allergic reaction. This may be done if you have had a reaction to a transfusion before. This medicine may be given to you by mouth or through an IV tube. °This procedure lasts about 1-4 hours. Plan for the time you need. °What happens during the procedure? ° °An IV tube will be put into one of your veins. °The bag of donated blood will be attached to your IV tube. Then, the blood will enter through your vein. °Your temperature,   blood pressure, and pulse will be checked often. This is done to find early signs of a transfusion reaction. °Tell your nurse right away if you have any of these symptoms: °Shortness of breath or trouble breathing. °Chest or back pain. °Fever or chills. °Red, swollen areas of skin or itching. °If you have any signs or symptoms of a reaction, your  transfusion will be stopped. You may also be given medicine. °When the transfusion is finished, your IV tube will be taken out. °Pressure may be put on the IV site for a few minutes. °A bandage (dressing) will be put on the IV site. °The procedure may vary among doctors and hospitals. °What happens after the procedure? °You will be monitored until you leave the hospital or clinic. This includes checking your temperature, blood pressure, pulse, breathing rate, and blood oxygen level. °Your blood may be tested to see how you are responding to the transfusion. °You may be warmed with fluids or blankets. This is done to keep the temperature of your body normal. °If you have your procedure in an outpatient setting, you will be told whom to contact to report any reactions. °Where to find more information °To learn more, visit the [employer]: redcross.org °Summary °A blood transfusion is a procedure in which you are given blood through an IV tube. °The blood may come from someone else (a donor). You may also be able to donate blood for yourself. °The blood you are given is made up of different blood cells. You may receive red blood cells, platelets, plasma, or white blood cells. °Your temperature, blood pressure, and pulse will be checked often. °After the procedure, your blood may be tested to see how you are responding. °This information is not intended to replace advice given to you by your health care provider. Make sure you discuss any questions you have with your health care provider. °Document Revised: 10/17/2018 Document Reviewed: 10/17/2018 °Elsevier Patient Education © 2022 [employer] ° °

## 2021-07-20 ENCOUNTER — Telehealth: Payer: Self-pay | Admitting: *Deleted

## 2021-07-20 LAB — TYPE AND SCREEN
ABO/RH(D): B POS
Antibody Screen: NEGATIVE
Unit division: 0
Unit division: 0

## 2021-07-20 LAB — BPAM RBC
Blood Product Expiration Date: 202303292359
Blood Product Expiration Date: 202303292359
ISSUE DATE / TIME: 202303140745
ISSUE DATE / TIME: 202303140745
Unit Type and Rh: 7300
Unit Type and Rh: 7300

## 2021-07-20 NOTE — Telephone Encounter (Signed)
Call received from patient's wife wanting to know if patient should take Macrobid prescribed to patient by urologist and Doxycycline ordered by Dr. Marin Olp together.  Dr. Marin Olp notified.  Pt.'s wife notified per order of Dr. Marin Olp that it is ok to take Macrobid and Doxycycline together.  Pt.'s wife is appreciative of assistance and has no further questions at this time.  ? ? ?

## 2021-07-21 ENCOUNTER — Other Ambulatory Visit: Payer: Self-pay

## 2021-07-21 ENCOUNTER — Inpatient Hospital Stay: Payer: Medicare Other

## 2021-07-21 VITALS — BP 131/75 | HR 77 | Temp 98.0°F | Resp 18

## 2021-07-21 DIAGNOSIS — Z5112 Encounter for antineoplastic immunotherapy: Secondary | ICD-10-CM | POA: Diagnosis not present

## 2021-07-21 DIAGNOSIS — E860 Dehydration: Secondary | ICD-10-CM | POA: Diagnosis not present

## 2021-07-21 DIAGNOSIS — Z5189 Encounter for other specified aftercare: Secondary | ICD-10-CM | POA: Diagnosis not present

## 2021-07-21 DIAGNOSIS — Z87891 Personal history of nicotine dependence: Secondary | ICD-10-CM | POA: Diagnosis not present

## 2021-07-21 DIAGNOSIS — C8330 Diffuse large B-cell lymphoma, unspecified site: Secondary | ICD-10-CM | POA: Diagnosis not present

## 2021-07-21 DIAGNOSIS — D61810 Antineoplastic chemotherapy induced pancytopenia: Secondary | ICD-10-CM | POA: Diagnosis not present

## 2021-07-21 DIAGNOSIS — Z794 Long term (current) use of insulin: Secondary | ICD-10-CM | POA: Diagnosis not present

## 2021-07-21 DIAGNOSIS — Z5111 Encounter for antineoplastic chemotherapy: Secondary | ICD-10-CM | POA: Diagnosis not present

## 2021-07-21 DIAGNOSIS — E1165 Type 2 diabetes mellitus with hyperglycemia: Secondary | ICD-10-CM | POA: Diagnosis not present

## 2021-07-21 DIAGNOSIS — I714 Abdominal aortic aneurysm, without rupture, unspecified: Secondary | ICD-10-CM | POA: Diagnosis not present

## 2021-07-21 LAB — CBC WITH DIFFERENTIAL (CANCER CENTER ONLY)
Abs Immature Granulocytes: 0.21 10*3/uL — ABNORMAL HIGH (ref 0.00–0.07)
Basophils Absolute: 0.1 10*3/uL (ref 0.0–0.1)
Basophils Relative: 1 %
Eosinophils Absolute: 0 10*3/uL (ref 0.0–0.5)
Eosinophils Relative: 0 %
HCT: 32.7 % — ABNORMAL LOW (ref 39.0–52.0)
Hemoglobin: 11.1 g/dL — ABNORMAL LOW (ref 13.0–17.0)
Immature Granulocytes: 3 %
Lymphocytes Relative: 2 %
Lymphs Abs: 0.2 10*3/uL — ABNORMAL LOW (ref 0.7–4.0)
MCH: 29.1 pg (ref 26.0–34.0)
MCHC: 33.9 g/dL (ref 30.0–36.0)
MCV: 85.6 fL (ref 80.0–100.0)
Monocytes Absolute: 0.7 10*3/uL (ref 0.1–1.0)
Monocytes Relative: 8 %
Neutro Abs: 7.4 10*3/uL (ref 1.7–7.7)
Neutrophils Relative %: 86 %
Platelet Count: 128 10*3/uL — ABNORMAL LOW (ref 150–400)
RBC: 3.82 MIL/uL — ABNORMAL LOW (ref 4.22–5.81)
RDW: 18.3 % — ABNORMAL HIGH (ref 11.5–15.5)
WBC Count: 8.6 10*3/uL (ref 4.0–10.5)
nRBC: 0 % (ref 0.0–0.2)

## 2021-07-21 LAB — CMP (CANCER CENTER ONLY)
ALT: 71 U/L — ABNORMAL HIGH (ref 0–44)
AST: 44 U/L — ABNORMAL HIGH (ref 15–41)
Albumin: 3.6 g/dL (ref 3.5–5.0)
Alkaline Phosphatase: 99 U/L (ref 38–126)
Anion gap: 10 (ref 5–15)
BUN: 17 mg/dL (ref 8–23)
CO2: 22 mmol/L (ref 22–32)
Calcium: 9 mg/dL (ref 8.9–10.3)
Chloride: 101 mmol/L (ref 98–111)
Creatinine: 0.76 mg/dL (ref 0.61–1.24)
GFR, Estimated: >60 mL/min (ref >60)
Glucose, Bld: 317 mg/dL — ABNORMAL HIGH (ref 70–99)
Potassium: 3.9 mmol/L (ref 3.5–5.1)
Sodium: 133 mmol/L — ABNORMAL LOW (ref 135–145)
Total Bilirubin: 0.9 mg/dL (ref 0.3–1.2)
Total Protein: 6.6 g/dL (ref 6.5–8.1)

## 2021-07-21 MED ORDER — SODIUM CHLORIDE 0.9% FLUSH
10.0000 mL | INTRAVENOUS | Status: DC | PRN
  Administered 2021-07-21: 10 mL via INTRAVENOUS

## 2021-07-21 MED ORDER — SODIUM CHLORIDE 0.9 % IV SOLN: Freq: Once | INTRAVENOUS | Status: DC

## 2021-07-21 MED ORDER — HEPARIN SOD (PORK) LOCK FLUSH 100 UNIT/ML IV SOLN
500.0000 [IU] | Freq: Once | INTRAVENOUS | Status: AC
  Administered 2021-07-21: 500 [IU] via INTRAVENOUS

## 2021-07-21 MED ORDER — SODIUM CHLORIDE 0.9 % IV SOLN: Freq: Once | INTRAVENOUS | Status: AC

## 2021-07-21 NOTE — Patient Instructions (Signed)

## 2021-07-21 NOTE — Patient Instructions (Signed)

## 2021-07-25 ENCOUNTER — Encounter: Payer: Self-pay | Admitting: *Deleted

## 2021-07-25 ENCOUNTER — Other Ambulatory Visit: Payer: Self-pay | Admitting: Hematology & Oncology

## 2021-07-25 ENCOUNTER — Other Ambulatory Visit (HOSPITAL_BASED_OUTPATIENT_CLINIC_OR_DEPARTMENT_OTHER): Payer: Self-pay

## 2021-07-26 ENCOUNTER — Ambulatory Visit (HOSPITAL_COMMUNITY)
Admission: RE | Admit: 2021-07-26 | Discharge: 2021-07-26 | Disposition: A | Discharge status: Home / Self Care | Payer: Medicare Other | Source: Ambulatory Visit | Attending: Hematology & Oncology | Admitting: Hematology & Oncology

## 2021-07-26 ENCOUNTER — Other Ambulatory Visit: Payer: Self-pay

## 2021-07-26 DIAGNOSIS — S2243XA Multiple fractures of ribs, bilateral, initial encounter for closed fracture: Secondary | ICD-10-CM | POA: Diagnosis not present

## 2021-07-26 DIAGNOSIS — E1101 Type 2 diabetes mellitus with hyperosmolarity with coma: Secondary | ICD-10-CM | POA: Diagnosis not present

## 2021-07-26 DIAGNOSIS — C8330 Diffuse large B-cell lymphoma, unspecified site: Secondary | ICD-10-CM | POA: Diagnosis not present

## 2021-07-26 DIAGNOSIS — Z872 Personal history of diseases of the skin and subcutaneous tissue: Secondary | ICD-10-CM | POA: Diagnosis not present

## 2021-07-26 DIAGNOSIS — Z794 Long term (current) use of insulin: Secondary | ICD-10-CM | POA: Insufficient documentation

## 2021-07-26 DIAGNOSIS — K5730 Diverticulosis of large intestine without perforation or abscess without bleeding: Secondary | ICD-10-CM | POA: Diagnosis not present

## 2021-07-26 LAB — GLUCOSE, CAPILLARY: Glucose-Capillary: 334 mg/dL — ABNORMAL HIGH (ref 70–99)

## 2021-07-26 MED ORDER — FLUDEOXYGLUCOSE F - 18 (FDG) INJECTION
8.7000 | Freq: Once | INTRAVENOUS | Status: AC
  Administered 2021-07-26: 8.67 via INTRAVENOUS

## 2021-07-27 ENCOUNTER — Encounter: Payer: Self-pay | Admitting: *Deleted

## 2021-07-27 NOTE — Progress Notes (Signed)
Oncology Nurse Navigator Documentation ? ? ?  07/27/2021  ?  2:00 PM  ?Oncology Nurse Navigator Flowsheets  ?Navigator Follow Up Date: 07/29/2021  ?Navigator Follow Up Reason: Follow-up Appointment;Chemotherapy  ?Navigator Location [facility]  ?Navigator Encounter Type Scan Review  ?Patient Visit Type MedOnc  ?Treatment Phase Active Tx  ?Barriers/Navigation Needs Coordination of Care;Education  ?Interventions None Required  ?Acuity Level 2-Minimal Needs (1-2 Barriers Identified)  ?Support Groups/Services Friends and Family  ?Time Spent with Patient 15  ?  ?

## 2021-07-28 ENCOUNTER — Telehealth: Payer: Self-pay | Admitting: *Deleted

## 2021-07-28 NOTE — Telephone Encounter (Signed)
As noted below by Dr. Marin Olp, I informed the patient that he has had great response to chemotherapy. You are basically in remission! He verbalized understanding.  ?

## 2021-07-28 NOTE — Telephone Encounter (Signed)
-----   Message from [name], MD sent at 07/27/2021  4:47 PM EDT ----- ?Call - Great response to chemo!!!!  You are basically in remission now!!!  I am not surprised!!  Aaron Weaver ?

## 2021-07-29 ENCOUNTER — Encounter: Payer: Self-pay | Admitting: *Deleted

## 2021-07-29 ENCOUNTER — Other Ambulatory Visit: Payer: Self-pay | Admitting: Pharmacist

## 2021-07-29 ENCOUNTER — Inpatient Hospital Stay: Payer: Medicare Other

## 2021-07-29 ENCOUNTER — Other Ambulatory Visit: Payer: Self-pay

## 2021-07-29 ENCOUNTER — Inpatient Hospital Stay: Payer: Medicare Other | Admitting: Hematology & Oncology

## 2021-07-29 ENCOUNTER — Encounter: Payer: Self-pay | Admitting: Hematology & Oncology

## 2021-07-29 VITALS — BP 124/74 | HR 77 | Temp 99.3°F | Resp 18 | Wt 168.0 lb

## 2021-07-29 DIAGNOSIS — Z5189 Encounter for other specified aftercare: Secondary | ICD-10-CM | POA: Diagnosis not present

## 2021-07-29 DIAGNOSIS — C8338 Diffuse large B-cell lymphoma, lymph nodes of multiple sites: Secondary | ICD-10-CM

## 2021-07-29 DIAGNOSIS — E1100 Type 2 diabetes mellitus with hyperosmolarity without nonketotic hyperglycemic-hyperosmolar coma (NKHHC): Secondary | ICD-10-CM

## 2021-07-29 DIAGNOSIS — E1165 Type 2 diabetes mellitus with hyperglycemia: Secondary | ICD-10-CM | POA: Diagnosis not present

## 2021-07-29 DIAGNOSIS — D61810 Antineoplastic chemotherapy induced pancytopenia: Secondary | ICD-10-CM | POA: Diagnosis not present

## 2021-07-29 DIAGNOSIS — Z5111 Encounter for antineoplastic chemotherapy: Secondary | ICD-10-CM | POA: Diagnosis not present

## 2021-07-29 DIAGNOSIS — C8330 Diffuse large B-cell lymphoma, unspecified site: Secondary | ICD-10-CM | POA: Diagnosis not present

## 2021-07-29 DIAGNOSIS — Z87891 Personal history of nicotine dependence: Secondary | ICD-10-CM | POA: Diagnosis not present

## 2021-07-29 DIAGNOSIS — Z794 Long term (current) use of insulin: Secondary | ICD-10-CM | POA: Diagnosis not present

## 2021-07-29 DIAGNOSIS — Z5112 Encounter for antineoplastic immunotherapy: Secondary | ICD-10-CM | POA: Diagnosis not present

## 2021-07-29 DIAGNOSIS — M7022 Olecranon bursitis, left elbow: Secondary | ICD-10-CM | POA: Diagnosis not present

## 2021-07-29 DIAGNOSIS — I714 Abdominal aortic aneurysm, without rupture, unspecified: Secondary | ICD-10-CM | POA: Diagnosis not present

## 2021-07-29 DIAGNOSIS — E860 Dehydration: Secondary | ICD-10-CM | POA: Diagnosis not present

## 2021-07-29 DIAGNOSIS — M25522 Pain in left elbow: Secondary | ICD-10-CM | POA: Diagnosis not present

## 2021-07-29 LAB — CBC WITH DIFFERENTIAL (CANCER CENTER ONLY)
Abs Immature Granulocytes: 0.24 10*3/uL — ABNORMAL HIGH (ref 0.00–0.07)
Basophils Absolute: 0.1 10*3/uL (ref 0.0–0.1)
Basophils Relative: 1 %
Eosinophils Absolute: 0 10*3/uL (ref 0.0–0.5)
Eosinophils Relative: 0 %
HCT: 36.3 % — ABNORMAL LOW (ref 39.0–52.0)
Hemoglobin: 12.1 g/dL — ABNORMAL LOW (ref 13.0–17.0)
Immature Granulocytes: 2 %
Lymphocytes Relative: 13 %
Lymphs Abs: 1.5 10*3/uL (ref 0.7–4.0)
MCH: 29.4 pg (ref 26.0–34.0)
MCHC: 33.3 g/dL (ref 30.0–36.0)
MCV: 88.1 fL (ref 80.0–100.0)
Monocytes Absolute: 1.5 10*3/uL — ABNORMAL HIGH (ref 0.1–1.0)
Monocytes Relative: 14 %
Neutro Abs: 8 10*3/uL — ABNORMAL HIGH (ref 1.7–7.7)
Neutrophils Relative %: 70 %
Platelet Count: 354 10*3/uL (ref 150–400)
RBC: 4.12 MIL/uL — ABNORMAL LOW (ref 4.22–5.81)
RDW: 18.2 % — ABNORMAL HIGH (ref 11.5–15.5)
WBC Count: 11.3 10*3/uL — ABNORMAL HIGH (ref 4.0–10.5)
nRBC: 0.3 % — ABNORMAL HIGH (ref 0.0–0.2)

## 2021-07-29 LAB — CMP (CANCER CENTER ONLY)
ALT: 26 U/L (ref 0–44)
AST: 24 U/L (ref 15–41)
Albumin: 3.8 g/dL (ref 3.5–5.0)
Alkaline Phosphatase: 85 U/L (ref 38–126)
Anion gap: 9 (ref 5–15)
BUN: 15 mg/dL (ref 8–23)
CO2: 26 mmol/L (ref 22–32)
Calcium: 9.5 mg/dL (ref 8.9–10.3)
Chloride: 97 mmol/L — ABNORMAL LOW (ref 98–111)
Creatinine: 0.82 mg/dL (ref 0.61–1.24)
GFR, Estimated: >60 mL/min (ref >60)
Glucose, Bld: 370 mg/dL — ABNORMAL HIGH (ref 70–99)
Potassium: 4.1 mmol/L (ref 3.5–5.1)
Sodium: 132 mmol/L — ABNORMAL LOW (ref 135–145)
Total Bilirubin: 0.5 mg/dL (ref 0.3–1.2)
Total Protein: 6.6 g/dL (ref 6.5–8.1)

## 2021-07-29 LAB — LACTATE DEHYDROGENASE: LDH: 187 U/L (ref 98–192)

## 2021-07-29 MED ORDER — INSULIN ASPART 100 UNIT/ML IJ SOLN
20.0000 [IU] | Freq: Once | INTRAMUSCULAR | Status: AC
  Administered 2021-07-29: 20 [IU] via SUBCUTANEOUS
  Filled 2021-07-29: qty 0.2

## 2021-07-29 NOTE — Progress Notes (Signed)
?Hematology and Oncology Follow Up Visit ? ?Aaron SCHIFANO Sr. ?[id number] ?[birth year] 66 y.o. ?07/29/2021 ? ? 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 ?Current Therapy:   ?R-CHOP --  s/p cycle #3 -- started on 05/20/2021 ?    ?Interim History:  Aaron Weaver is back for for follow-up.  His wife is on the cell phone.  He did have a PET scan done.  This was done earlier this week.  To no surprise, the PET scan shows that he has had a wonderful response.  I would have to say that he probably is in remission now. ? ?Unfortunately, I think we have a problem with the bursitis in his left elbow.  I saw I last saw him, this was erythematous with some slight fluctuance.  I put him on some IV antibiotic in the office and then some oral antibiotic.  The elbow still looks quite red.  It is somewhat tender and somewhat fluctuant.  I really do not think we can treat him with chemotherapy given this elbow issue. ? ?I spoke with Dr. Rhona Raider of [facility].  He has graciously agreed to see Aaron Weaver today to try to help with the elbow and try to see how we can improve the infection.  I am sure that he will try to drain the fluid and there. ? ?Aaron Weaver has had problems with MRSA in the past.  I really hope this is not MRSA. ? ?Again, I cannot treat him with chemotherapy until we get his elbow improved. ? ?His blood sugars were 370.  He got some insulin in the office.  I think this is from probably from the prednisone that he has been taking for the chemotherapy. ? ?He has had no problems with his heart.  He has had no palpitations.  He has had no shortness of breath.  He has had no nausea or vomiting.  He has had no problems with diarrhea. ? ?We did have to transfuse him a week or so ago.  He had marked decrease in his blood counts.  His hemoglobin was 7.3.  He felt a lot better after the transfusion. ? ?Currently, I would say his performance status is probably ECOG 1.    ? ? ?Medications:  ?Current Outpatient Medications:  ?  acetaminophen (TYLENOL) 325 MG tablet, Take 650 mg by mouth every 6 (six) hours as needed for mild pain or headache., Disp: , Rfl:  ?  albuterol (PROAIR HFA) 108 (90 Base) MCG/ACT inhaler, Inhale 1-2 puffs into the lungs every 6 (six) hours as needed for wheezing or shortness of breath., Disp: 8 g, Rfl: 6 ?  alendronate (FOSAMAX) 70 MG tablet, Take 70 mg by mouth every Sunday., Disp: , Rfl:  ?  allopurinol (ZYLOPRIM) 100 MG tablet, Take 1 tablet (100 mg total) by mouth daily. Start 3 days BEFORE chemotherapy starts (Patient taking differently: Take 100 mg by mouth daily.), Disp: 30 tablet, Rfl: 0 ?  aspirin EC 81 MG EC tablet, Take 1 tablet (81 mg total) by mouth daily at 6 (six) AM. Swallow whole. (Patient taking differently: Take 81 mg by mouth in the morning. Swallow whole.), Disp: 30 tablet, Rfl: 11 ?  atenolol (TENORMIN) 100 MG tablet, Take 100 mg by mouth daily., Disp: , Rfl:  ?  blood glucose meter kit and supplies KIT, Dispense based on patient and insurance preference. Use up to four times daily as directed., Disp: 1 each, Rfl:  0 ?  calcium carbonate (OSCAL) 1500 (600 Ca) MG TABS tablet, Take 600 mg of elemental calcium by mouth daily with breakfast., Disp: , Rfl:  ?  Continuous Blood Gluc Receiver (FREESTYLE LIBRE 2 READER) DEVI, , Disp: , Rfl:  ?  Continuous Blood Gluc Sensor (FREESTYLE LIBRE 14 DAY SENSOR) MISC, Inject 1 Device into the skin every 14 (fourteen) days., Disp: , Rfl:  ?  diclofenac Sodium (VOLTAREN) 1 % GEL, Apply 2 g topically daily as needed (to painful sites)., Disp: , Rfl:  ?  doxycycline (VIBRA-TABS) 100 MG tablet, Take 1 tablet (100 mg total) by mouth 2 (two) times daily., Disp: 20 tablet, Rfl: 0 ?  DULoxetine (CYMBALTA) 60 MG capsule, Take 60 mg by mouth at bedtime., Disp: , Rfl:  ?  famciclovir (FAMVIR) 250 MG tablet, Take 1 tablet (250 mg total) by mouth 2 (two) times daily., Disp: 30 tablet, Rfl: 8 ?  Fish Oil OIL, Take  1,500 mg by mouth daily., Disp: , Rfl:  ?  fluconazole (DIFLUCAN) 150 MG tablet, Take 150 mg by mouth once., Disp: , Rfl:  ?  insulin glargine (LANTUS) 100 UNIT/ML Solostar Pen, Inject 25 Units into the skin daily., Disp: 15 mL, Rfl: 11 ?  insulin lispro (HUMALOG) 100 UNIT/ML KwikPen, Inject 2-15 Units into the skin 4 (four) times daily - after meals and at bedtime. Glucose 121 - 150: 2 units, Glucose 151 - 200: 3 units, Glucose 201 - 250: 5 units, Glucose 251 - 300: 8 units, Glucose 301 - 350: 11 units, Glucose 351 - 400: 15 units, Glucose > 400 call MD, Disp: 15 mL, Rfl: 11 ?  Insulin Pen Needle 32G X 4 MM MISC, 1 each by Does not apply route 4 (four) times daily - after meals and at bedtime., Disp: 100 each, Rfl: 5 ?  LORazepam (ATIVAN) 0.5 MG tablet, Take 1 tablet (0.5 mg total) by mouth every 8 (eight) hours. Take for nausea prn (Patient not taking: Reported on 07/18/2021), Disp: 30 tablet, Rfl: 1 ?  nitrofurantoin, macrocrystal-monohydrate, (MACROBID) 100 MG capsule, Take 100 mg by mouth 2 (two) times daily., Disp: , Rfl:  ?  omeprazole (PRILOSEC) 40 MG capsule, Take 40 mg by mouth every evening., Disp: , Rfl:  ?  ondansetron (ZOFRAN) 8 MG tablet, Take 1 tablet (8 mg total) by mouth 2 (two) times daily as needed for refractory nausea / vomiting. Start on day 3 after cyclophosphamide chemotherapy. (Patient not taking: Reported on 07/18/2021), Disp: 30 tablet, Rfl: 1 ?  potassium chloride SA (KLOR-CON M20) 20 MEQ tablet, TAKE 1 TABLET (20 MEQ TOTAL) BY MOUTH DAILY.  (Patient taking differently: Take 20 mEq by mouth in the morning.), Disp: 30 tablet, Rfl: 5 ?  prochlorperazine (COMPAZINE) 10 MG tablet, Take 1 tablet (10 mg total) by mouth every 6 (six) hours as needed (Nausea or vomiting). (Patient not taking: Reported on 07/18/2021), Disp: 30 tablet, Rfl: 6 ?  rosuvastatin (CRESTOR) 10 MG tablet, Take 10 mg by mouth daily., Disp: , Rfl:  ?  SEREVENT DISKUS 50 MCG/ACT diskus inhaler, Inhale 1 puff into the lungs 2  (two) times daily., Disp: , Rfl:  ?  tadalafil (CIALIS) 5 MG tablet, Take 5 mg by mouth in the morning., Disp: , Rfl:  ?  tamsulosin (FLOMAX) 0.4 MG CAPS capsule, Take 0.4 mg by mouth every evening., Disp: , Rfl:  ?  Zinc 50 MG TABS, Take 50 mg by mouth daily., Disp: , Rfl:  ? ?Allergies:  ?Allergies  ?  Allergen Reactions  ? Fexofenadine Other (See Comments)  ?  "dries me out too much"  ? Hydrochlorothiazide Other (See Comments)  ?  Cramps  ? Penicillins Hives  ? Pravastatin Sodium Other (See Comments)  ?  Aches  ? Requip [Ropinirole Hcl] Other (See Comments)  ?  Bad dreams  ? ? ?Past Medical History, Surgical history, Social history, and Family History were reviewed and updated. ? ?Review of Systems: ?Review of Systems  ?Constitutional: Negative.   ?HENT:  Negative.    ?Eyes: Negative.   ?Respiratory: Negative.    ?Cardiovascular: Negative.   ?Gastrointestinal: Negative.   ?Endocrine: Negative.   ?Genitourinary: Negative.    ?Musculoskeletal: Negative.   ?Skin: Negative.   ?Neurological: Negative.   ?Hematological: Negative.   ?Psychiatric/Behavioral: Negative.    ? ?Physical Exam: ? weight is 168 lb (76.2 kg). His oral temperature is 99.3 ?F (37.4 ?C). His blood pressure is 124/74 and his pulse is 77. His respiration is 18 and oxygen saturation is 94%.  ? ?Wt Readings from Last 3 Encounters:  ?07/29/21 168 lb (76.2 kg)  ?07/18/21 174 lb (78.9 kg)  ?07/08/21 174 lb (78.9 kg)  ?His vital signs show temperature of 98.6.  Pulse 72.  Blood pressure 103/62.  Weight is 180 pounds. ? ?Physical Exam ?Vitals reviewed.  ?HENT:  ?   Head: Normocephalic and atraumatic.  ?Eyes:  ?   Pupils: Pupils are equal, round, and reactive to light.  ?Cardiovascular:  ?   Rate and Rhythm: Normal rate and regular rhythm.  ?   Heart sounds: Normal heart sounds.  ?Pulmonary:  ?   Effort: Pulmonary effort is normal.  ?   Breath sounds: Normal breath sounds.  ?Abdominal:  ?   General: Bowel sounds are normal.  ?   Palpations: Abdomen is soft.   ?Musculoskeletal:     ?   General: No tenderness or deformity. Normal range of motion.  ?   Cervical back: Normal range of motion.  ?   Comments: Evaluation of her left elbow does show some fluctuance.  There is som

## 2021-07-29 NOTE — Progress Notes (Signed)
Patient is doing well today and read for his next treatment. He is very happy with the results of his most recent PET scan.  ? ?Unfortunately the patient has an active infection and cannot have his next cycle of treatment. Will delay until next week. ? ?Oncology Nurse Navigator Documentation ? ? ?  07/29/2021  ?  9:45 AM  ?Oncology Nurse Navigator Flowsheets  ?Navigator Follow Up Date: 08/05/2021  ?Navigator Follow Up Reason: Follow-up Appointment  ?Navigator Location [facility]  ?Navigator Encounter Type Treatment;Appt/Treatment Plan Review  ?Patient Visit Type MedOnc  ?Treatment Phase Active Tx  ?Barriers/Navigation Needs Coordination of Care;Education  ?Interventions Psycho-Social Support  ?Acuity Level 2-Minimal Needs (1-2 Barriers Identified)  ?Support Groups/Services Friends and Family  ?Time Spent with Patient 15  ?  ?

## 2021-08-02 ENCOUNTER — Encounter: Payer: Self-pay | Admitting: Hematology & Oncology

## 2021-08-02 DIAGNOSIS — R338 Other retention of urine: Secondary | ICD-10-CM | POA: Diagnosis not present

## 2021-08-03 ENCOUNTER — Other Ambulatory Visit (HOSPITAL_BASED_OUTPATIENT_CLINIC_OR_DEPARTMENT_OTHER): Payer: Self-pay

## 2021-08-05 ENCOUNTER — Inpatient Hospital Stay: Payer: Medicare Other

## 2021-08-05 ENCOUNTER — Inpatient Hospital Stay (HOSPITAL_BASED_OUTPATIENT_CLINIC_OR_DEPARTMENT_OTHER): Payer: Medicare Other | Admitting: Hematology & Oncology

## 2021-08-05 ENCOUNTER — Other Ambulatory Visit (HOSPITAL_BASED_OUTPATIENT_CLINIC_OR_DEPARTMENT_OTHER): Payer: Self-pay

## 2021-08-05 ENCOUNTER — Encounter: Payer: Self-pay | Admitting: *Deleted

## 2021-08-05 VITALS — BP 101/70 | HR 70 | Temp 97.6°F | Resp 17 | Wt 161.0 lb

## 2021-08-05 DIAGNOSIS — Z794 Long term (current) use of insulin: Secondary | ICD-10-CM | POA: Diagnosis not present

## 2021-08-05 DIAGNOSIS — Z5111 Encounter for antineoplastic chemotherapy: Secondary | ICD-10-CM | POA: Diagnosis not present

## 2021-08-05 DIAGNOSIS — M25552 Pain in left hip: Secondary | ICD-10-CM

## 2021-08-05 DIAGNOSIS — C8330 Diffuse large B-cell lymphoma, unspecified site: Secondary | ICD-10-CM | POA: Diagnosis not present

## 2021-08-05 DIAGNOSIS — E860 Dehydration: Secondary | ICD-10-CM | POA: Diagnosis not present

## 2021-08-05 DIAGNOSIS — S2249XS Multiple fractures of ribs, unspecified side, sequela: Secondary | ICD-10-CM

## 2021-08-05 DIAGNOSIS — E1165 Type 2 diabetes mellitus with hyperglycemia: Secondary | ICD-10-CM | POA: Diagnosis not present

## 2021-08-05 DIAGNOSIS — I714 Abdominal aortic aneurysm, without rupture, unspecified: Secondary | ICD-10-CM | POA: Diagnosis not present

## 2021-08-05 DIAGNOSIS — C8338 Diffuse large B-cell lymphoma, lymph nodes of multiple sites: Secondary | ICD-10-CM

## 2021-08-05 DIAGNOSIS — Z5112 Encounter for antineoplastic immunotherapy: Secondary | ICD-10-CM | POA: Diagnosis not present

## 2021-08-05 DIAGNOSIS — Z5189 Encounter for other specified aftercare: Secondary | ICD-10-CM | POA: Diagnosis not present

## 2021-08-05 DIAGNOSIS — E1100 Type 2 diabetes mellitus with hyperosmolarity without nonketotic hyperglycemic-hyperosmolar coma (NKHHC): Secondary | ICD-10-CM

## 2021-08-05 DIAGNOSIS — D61810 Antineoplastic chemotherapy induced pancytopenia: Secondary | ICD-10-CM | POA: Diagnosis not present

## 2021-08-05 DIAGNOSIS — Z87891 Personal history of nicotine dependence: Secondary | ICD-10-CM | POA: Diagnosis not present

## 2021-08-05 LAB — CBC WITH DIFFERENTIAL (CANCER CENTER ONLY)
Abs Immature Granulocytes: 0.08 10*3/uL — ABNORMAL HIGH (ref 0.00–0.07)
Basophils Absolute: 0.1 10*3/uL (ref 0.0–0.1)
Basophils Relative: 1 %
Eosinophils Absolute: 0.1 10*3/uL (ref 0.0–0.5)
Eosinophils Relative: 1 %
HCT: 36.1 % — ABNORMAL LOW (ref 39.0–52.0)
Hemoglobin: 12.1 g/dL — ABNORMAL LOW (ref 13.0–17.0)
Immature Granulocytes: 1 %
Lymphocytes Relative: 22 %
Lymphs Abs: 1.9 10*3/uL (ref 0.7–4.0)
MCH: 29.9 pg (ref 26.0–34.0)
MCHC: 33.5 g/dL (ref 30.0–36.0)
MCV: 89.1 fL (ref 80.0–100.0)
Monocytes Absolute: 1.2 10*3/uL — ABNORMAL HIGH (ref 0.1–1.0)
Monocytes Relative: 14 %
Neutro Abs: 5.3 10*3/uL (ref 1.7–7.7)
Neutrophils Relative %: 61 %
Platelet Count: 199 10*3/uL (ref 150–400)
RBC: 4.05 MIL/uL — ABNORMAL LOW (ref 4.22–5.81)
RDW: 17.9 % — ABNORMAL HIGH (ref 11.5–15.5)
WBC Count: 8.7 10*3/uL (ref 4.0–10.5)
nRBC: 0 % (ref 0.0–0.2)

## 2021-08-05 LAB — IRON AND IRON BINDING CAPACITY (CC-WL,HP ONLY)
Iron: 163 ug/dL (ref 45–182)
Saturation Ratios: 57 % — ABNORMAL HIGH (ref 17.9–39.5)
TIBC: 288 ug/dL (ref 250–450)
UIBC: 125 ug/dL (ref 117–376)

## 2021-08-05 LAB — CMP (CANCER CENTER ONLY)
ALT: 28 U/L (ref 0–44)
AST: 20 U/L (ref 15–41)
Albumin: 3.9 g/dL (ref 3.5–5.0)
Alkaline Phosphatase: 78 U/L (ref 38–126)
Anion gap: 8 (ref 5–15)
BUN: 25 mg/dL — ABNORMAL HIGH (ref 8–23)
CO2: 25 mmol/L (ref 22–32)
Calcium: 9.3 mg/dL (ref 8.9–10.3)
Chloride: 101 mmol/L (ref 98–111)
Creatinine: 0.8 mg/dL (ref 0.61–1.24)
GFR, Estimated: >60 mL/min (ref >60)
Glucose, Bld: 307 mg/dL — ABNORMAL HIGH (ref 70–99)
Potassium: 4.1 mmol/L (ref 3.5–5.1)
Sodium: 134 mmol/L — ABNORMAL LOW (ref 135–145)
Total Bilirubin: 0.7 mg/dL (ref 0.3–1.2)
Total Protein: 6.2 g/dL — ABNORMAL LOW (ref 6.5–8.1)

## 2021-08-05 LAB — FERRITIN: Ferritin: 1259 ng/mL — ABNORMAL HIGH (ref 24–336)

## 2021-08-05 LAB — LACTATE DEHYDROGENASE: LDH: 160 U/L (ref 98–192)

## 2021-08-05 MED ORDER — SODIUM CHLORIDE 0.9 % IV SOLN
10.0000 mg | Freq: Once | INTRAVENOUS | Status: AC
  Administered 2021-08-05: 10 mg via INTRAVENOUS
  Filled 2021-08-05: qty 10

## 2021-08-05 MED ORDER — PALONOSETRON HCL INJECTION 0.25 MG/5ML
0.2500 mg | Freq: Once | INTRAVENOUS | Status: AC
  Administered 2021-08-05: 0.25 mg via INTRAVENOUS
  Filled 2021-08-05: qty 5

## 2021-08-05 MED ORDER — HEPARIN SOD (PORK) LOCK FLUSH 100 UNIT/ML IV SOLN
500.0000 [IU] | Freq: Once | INTRAVENOUS | Status: AC | PRN
  Administered 2021-08-05: 500 [IU]

## 2021-08-05 MED ORDER — SODIUM CHLORIDE 0.9 % IV SOLN
750.0000 mg/m2 | Freq: Once | INTRAVENOUS | Status: AC
  Administered 2021-08-05: 1600 mg via INTRAVENOUS
  Filled 2021-08-05: qty 80

## 2021-08-05 MED ORDER — SODIUM CHLORIDE 0.9 % IV SOLN: Freq: Once | INTRAVENOUS | Status: AC

## 2021-08-05 MED ORDER — VINCRISTINE SULFATE CHEMO INJECTION 1 MG/ML
2.0000 mg | Freq: Once | INTRAVENOUS | Status: AC
  Administered 2021-08-05: 2 mg via INTRAVENOUS
  Filled 2021-08-05: qty 2

## 2021-08-05 MED ORDER — DOXORUBICIN HCL CHEMO IV INJECTION 2 MG/ML
50.0000 mg/m2 | Freq: Once | INTRAVENOUS | Status: AC
  Administered 2021-08-05: 106 mg via INTRAVENOUS
  Filled 2021-08-05: qty 53

## 2021-08-05 MED ORDER — ZOLEDRONIC ACID 4 MG/100ML IV SOLN
4.0000 mg | Freq: Once | INTRAVENOUS | Status: AC
  Administered 2021-08-05: 4 mg via INTRAVENOUS
  Filled 2021-08-05: qty 100

## 2021-08-05 MED ORDER — SODIUM CHLORIDE 0.9% FLUSH
10.0000 mL | INTRAVENOUS | Status: DC | PRN
  Administered 2021-08-05: 10 mL

## 2021-08-05 MED ORDER — SODIUM CHLORIDE 0.9 % IV SOLN
150.0000 mg | Freq: Once | INTRAVENOUS | Status: AC
  Administered 2021-08-05: 150 mg via INTRAVENOUS
  Filled 2021-08-05: qty 150

## 2021-08-05 MED ORDER — ACETAMINOPHEN 325 MG PO TABS
650.0000 mg | ORAL_TABLET | Freq: Once | ORAL | Status: AC
  Administered 2021-08-05: 650 mg via ORAL
  Filled 2021-08-05: qty 2

## 2021-08-05 MED ORDER — SODIUM CHLORIDE 0.9 % IV SOLN
375.0000 mg/m2 | Freq: Once | INTRAVENOUS | Status: AC
  Administered 2021-08-05: 800 mg via INTRAVENOUS
  Filled 2021-08-05: qty 30

## 2021-08-05 MED ORDER — DIPHENHYDRAMINE HCL 25 MG PO CAPS
50.0000 mg | ORAL_CAPSULE | Freq: Once | ORAL | Status: AC
  Administered 2021-08-05: 50 mg via ORAL
  Filled 2021-08-05: qty 2

## 2021-08-05 NOTE — Progress Notes (Signed)
Patient is here after one week delay due to elbow infection. The patient states he is doing well and he feels like his elbow is much improved. He is hopeful to get his treatment today.  ? ?Oncology Nurse Navigator Documentation ? ? ?  08/05/2021  ?  9:30 AM  ?Oncology Nurse Navigator Flowsheets  ?Navigator Follow Up Date: 08/26/2021  ?Navigator Follow Up Reason: Follow-up Appointment;Chemotherapy  ?Navigator Location [facility]  ?Navigator Encounter Type Treatment;Appt/Treatment Plan Review  ?Patient Visit Type MedOnc  ?Treatment Phase Active Tx  ?Barriers/Navigation Needs Coordination of Care;Education  ?Interventions Psycho-Social Support  ?Acuity Level 2-Minimal Needs (1-2 Barriers Identified)  ?Support Groups/Services Friends and Family  ?Time Spent with Patient 15  ?  ?

## 2021-08-05 NOTE — Patient Instructions (Signed)
[facility]  Discharge Instructions: ?Thank you for choosing [facility] to provide your oncology and hematology care.  ? ?If you have a lab appointment with the [facility], please go directly to the [facility] and check in at the registration area. ? ?Wear comfortable clothing and clothing appropriate for easy access to any Portacath or PICC line.  ? ?We strive to give you quality time with your provider. You may need to reschedule your appointment if you arrive late (15 or more minutes).  Arriving late affects you and other patients whose appointments are after yours.  Also, if you miss three or more appointments without notifying the office, you may be dismissed from the clinic at the provider?s discretion.    ?  ?For prescription refill requests, have your pharmacy contact our office and allow 72 hours for refills to be completed.   ? ?Today you received the following chemotherapy and/or immunotherapy agents RCHOP, Zometa    ?  ?To help prevent nausea and vomiting after your treatment, we encourage you to take your nausea medication as directed. ? ?BELOW ARE SYMPTOMS THAT SHOULD BE REPORTED IMMEDIATELY: ?*FEVER GREATER THAN 100.4 F (38 ?C) OR HIGHER ?*CHILLS OR SWEATING ?*NAUSEA AND VOMITING THAT IS NOT CONTROLLED WITH YOUR NAUSEA MEDICATION ?*UNUSUAL SHORTNESS OF BREATH ?*UNUSUAL BRUISING OR BLEEDING ?*URINARY PROBLEMS (pain or burning when urinating, or frequent urination) ?*BOWEL PROBLEMS (unusual diarrhea, constipation, pain near the anus) ?TENDERNESS IN MOUTH AND THROAT WITH OR WITHOUT PRESENCE OF ULCERS (sore throat, sores in mouth, or a toothache) ?UNUSUAL RASH, SWELLING OR PAIN  ?UNUSUAL VAGINAL DISCHARGE OR ITCHING  ? ?Items with * indicate a potential emergency and should be followed up as soon as possible or go to the Emergency Department if any problems should occur. ? ?Please show the CHEMOTHERAPY ALERT CARD or IMMUNOTHERAPY ALERT CARD at check-in to  the Emergency Department and triage nurse. ?Should you have questions after your visit or need to cancel or reschedule your appointment, please contact [facility]  [phone] and follow the prompts.  Office hours are 8:00 a.m. to 4:30 p.m. Monday - Friday. Please note that voicemails left after 4:00 p.m. may not be returned until the following business day.  We are closed weekends and major holidays. You have access to a nurse at all times for urgent questions. Please call the main number to the clinic [phone] and follow the prompts. ? ?For any non-urgent questions, you may also contact your provider using MyChart. We now offer e-Visits for anyone 37 and older to request care online for non-urgent symptoms. For details visit [email or web address]. ?  ?Also download the MyChart app! Go to the app store, search "MyChart", open the app, select [facility], and log in with your MyChart username and password. ? ?Due to Covid, a mask is required upon entering the hospital/clinic. If you do not have a mask, one will be given to you upon arrival. For doctor visits, patients may have 1 support person aged 54 or older with them. For treatment visits, patients cannot have anyone with them due to current Covid guidelines and our immunocompromised population.  ?

## 2021-08-05 NOTE — Progress Notes (Signed)
?Hematology and Oncology Follow Up Visit ? ?Aaron BANGURA Sr. ?[id number] ?12/20/1955 66 y.o. ?08/05/2021 ? ? 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 ?Current Therapy:   ?R-CHOP --  s/p cycle #3 -- started on 05/20/2021 ?    ?Interim History:  Aaron Weaver is back for for follow-up.  He is doing much better.  The left elbow was seen by Aaron Weaver of Orthopedic Surgery.  He went ahead and drained this.  Cultures were taken.  I think the cultures were negative.  He is on some antibiotics right now.  The elbow feels a lot better. ? ?We will go ahead with his treatment today.  This will be his fourth cycle of therapy. ? ?He has had no problems with cough or shortness of breath.  He has had no nausea or vomiting.  He has had no diarrhea or constipation. ? ?His heart is doing okay. ? ?There is no problems with the right axilla. ? ?Currently, I would say his performance status is ECOG 1.   ? ? ?Medications:  ?Current Outpatient Medications:  ?  acetaminophen (TYLENOL) 325 MG tablet, Take 650 mg by mouth every 6 (six) hours as needed for mild pain or headache., Disp: , Rfl:  ?  albuterol (PROAIR HFA) 108 (90 Base) MCG/ACT inhaler, Inhale 1-2 puffs into the lungs every 6 (six) hours as needed for wheezing or shortness of breath., Disp: 8 g, Rfl: 6 ?  alendronate (FOSAMAX) 70 MG tablet, Take 70 mg by mouth every Sunday., Disp: , Rfl:  ?  allopurinol (ZYLOPRIM) 100 MG tablet, Take 1 tablet (100 mg total) by mouth daily. Start 3 days BEFORE chemotherapy starts (Patient taking differently: Take 100 mg by mouth daily.), Disp: 30 tablet, Rfl: 0 ?  aspirin EC 81 MG EC tablet, Take 1 tablet (81 mg total) by mouth daily at 6 (six) AM. Swallow whole. (Patient taking differently: Take 81 mg by mouth in the morning. Swallow whole.), Disp: 30 tablet, Rfl: 11 ?  atenolol (TENORMIN) 100 MG tablet, Take 100 mg by mouth daily., Disp: , Rfl:  ?  blood glucose meter kit and supplies KIT, Dispense based on  patient and insurance preference. Use up to four times daily as directed., Disp: 1 each, Rfl: 0 ?  calcium carbonate (OSCAL) 1500 (600 Ca) MG TABS tablet, Take 600 mg of elemental calcium by mouth daily with breakfast., Disp: , Rfl:  ?  Continuous Blood Gluc Receiver (FREESTYLE LIBRE 2 READER) DEVI, , Disp: , Rfl:  ?  Continuous Blood Gluc Sensor (FREESTYLE LIBRE 14 DAY SENSOR) MISC, Inject 1 Device into the skin every 14 (fourteen) days., Disp: , Rfl:  ?  diclofenac Sodium (VOLTAREN) 1 % GEL, Apply 2 g topically daily as needed (to painful sites)., Disp: , Rfl:  ?  doxycycline (VIBRA-TABS) 100 MG tablet, Take 1 tablet (100 mg total) by mouth 2 (two) times daily., Disp: 20 tablet, Rfl: 0 ?  DULoxetine (CYMBALTA) 60 MG capsule, Take 60 mg by mouth at bedtime., Disp: , Rfl:  ?  famciclovir (FAMVIR) 250 MG tablet, Take 1 tablet (250 mg total) by mouth 2 (two) times daily., Disp: 30 tablet, Rfl: 8 ?  Fish Oil OIL, Take 1,500 mg by mouth daily., Disp: , Rfl:  ?  fluconazole (DIFLUCAN) 150 MG tablet, Take 150 mg by mouth once., Disp: , Rfl:  ?  insulin glargine (LANTUS) 100 UNIT/ML Solostar Pen, Inject 25 Units into the skin daily., Disp:  15 mL, Rfl: 11 ?  insulin lispro (HUMALOG) 100 UNIT/ML KwikPen, Inject 2-15 Units into the skin 4 (four) times daily - after meals and at bedtime. Glucose 121 - 150: 2 units, Glucose 151 - 200: 3 units, Glucose 201 - 250: 5 units, Glucose 251 - 300: 8 units, Glucose 301 - 350: 11 units, Glucose 351 - 400: 15 units, Glucose > 400 call MD, Disp: 15 mL, Rfl: 11 ?  Insulin Pen Needle 32G X 4 MM MISC, 1 each by Does not apply route 4 (four) times daily - after meals and at bedtime., Disp: 100 each, Rfl: 5 ?  LORazepam (ATIVAN) 0.5 MG tablet, Take 1 tablet (0.5 mg total) by mouth every 8 (eight) hours. Take for nausea prn (Patient not taking: Reported on 07/18/2021), Disp: 30 tablet, Rfl: 1 ?  nitrofurantoin, macrocrystal-monohydrate, (MACROBID) 100 MG capsule, Take 100 mg by mouth 2 (two)  times daily., Disp: , Rfl:  ?  omeprazole (PRILOSEC) 40 MG capsule, Take 40 mg by mouth every evening., Disp: , Rfl:  ?  ondansetron (ZOFRAN) 8 MG tablet, Take 1 tablet (8 mg total) by mouth 2 (two) times daily as needed for refractory nausea / vomiting. Start on day 3 after cyclophosphamide chemotherapy. (Patient not taking: Reported on 07/18/2021), Disp: 30 tablet, Rfl: 1 ?  potassium chloride SA (KLOR-CON M20) 20 MEQ tablet, TAKE 1 TABLET (20 MEQ TOTAL) BY MOUTH DAILY.  (Patient taking differently: Take 20 mEq by mouth in the morning.), Disp: 30 tablet, Rfl: 5 ?  prochlorperazine (COMPAZINE) 10 MG tablet, Take 1 tablet (10 mg total) by mouth every 6 (six) hours as needed (Nausea or vomiting). (Patient not taking: Reported on 07/18/2021), Disp: 30 tablet, Rfl: 6 ?  rosuvastatin (CRESTOR) 10 MG tablet, Take 10 mg by mouth daily., Disp: , Rfl:  ?  SEREVENT DISKUS 50 MCG/ACT diskus inhaler, Inhale 1 puff into the lungs 2 (two) times daily., Disp: , Rfl:  ?  tadalafil (CIALIS) 5 MG tablet, Take 5 mg by mouth in the morning., Disp: , Rfl:  ?  tamsulosin (FLOMAX) 0.4 MG CAPS capsule, Take 0.4 mg by mouth every evening., Disp: , Rfl:  ?  Zinc 50 MG TABS, Take 50 mg by mouth daily., Disp: , Rfl:  ? ?Allergies:  ?Allergies  ?Allergen Reactions  ? Fexofenadine Other (See Comments)  ?  "dries me out too much"  ? Hydrochlorothiazide Other (See Comments)  ?  Cramps  ? Penicillins Hives  ? Pravastatin Sodium Other (See Comments)  ?  Aches  ? Requip [Ropinirole Hcl] Other (See Comments)  ?  Bad dreams  ? ? ?Past Medical History, Surgical history, Social history, and Family History were reviewed and updated. ? ?Review of Systems: ?Review of Systems  ?Constitutional: Negative.   ?HENT:  Negative.    ?Eyes: Negative.   ?Respiratory: Negative.    ?Cardiovascular: Negative.   ?Gastrointestinal: Negative.   ?Endocrine: Negative.   ?Genitourinary: Negative.    ?Musculoskeletal: Negative.   ?Skin: Negative.   ?Neurological: Negative.    ?Hematological: Negative.   ?Psychiatric/Behavioral: Negative.    ? ?Physical Exam: ? weight is 161 lb (73 kg). His oral temperature is 97.6 ?F (36.4 ?C). His blood pressure is 101/70 and his pulse is 70. His respiration is 17 and oxygen saturation is 100%.  ? ?Wt Readings from Last 3 Encounters:  ?08/05/21 161 lb (73 kg)  ?07/29/21 168 lb (76.2 kg)  ?07/18/21 174 lb (78.9 kg)  ?His vital signs show temperature of  98.6.  Pulse 72.  Blood pressure 103/62.  Weight is 180 pounds. ? ?Physical Exam ?Vitals reviewed.  ?HENT:  ?   Head: Normocephalic and atraumatic.  ?Eyes:  ?   Pupils: Pupils are equal, round, and reactive to light.  ?Cardiovascular:  ?   Rate and Rhythm: Normal rate and regular rhythm.  ?   Heart sounds: Normal heart sounds.  ?Pulmonary:  ?   Effort: Pulmonary effort is normal.  ?   Breath sounds: Normal breath sounds.  ?Abdominal:  ?   General: Bowel sounds are normal.  ?   Palpations: Abdomen is soft.  ?Musculoskeletal:     ?   General: No tenderness or deformity. Normal range of motion.  ?   Cervical back: Normal range of motion.  ?   Comments: Evaluation of her left elbow does show some fluctuance.  There is some erythema.  There is slight tenderness to palpation.  There is no exudate from the eschar.  He has decent range of motion of the elbow.  ?Lymphadenopathy:  ?   Cervical: No cervical adenopathy.  ?Skin: ?   General: Skin is warm and dry.  ?   Findings: No erythema or rash.  ?Neurological:  ?   Mental Status: He is alert and oriented to person, place, and time.  ?Psychiatric:     ?   Behavior: Behavior normal.     ?   Thought Content: Thought content normal.     ?   Judgment: Judgment normal.  ? ? ? ?Lab Results  ?Component Value Date  ? WBC 8.7 08/05/2021  ? HGB 12.1 (L) 08/05/2021  ? HCT 36.1 (L) 08/05/2021  ? MCV 89.1 08/05/2021  ? PLT 199 08/05/2021  ? ?  Chemistry   ?   ?Component Value Date/Time  ? NA 132 (L) 07/29/2021 0909  ? K 4.1 07/29/2021 0909  ? CL 97 (L) 07/29/2021 0909  ? CO2 26  07/29/2021 0909  ? BUN 15 07/29/2021 0909  ? CREATININE 0.82 07/29/2021 0909  ?    ?Component Value Date/Time  ? CALCIUM 9.5 07/29/2021 0909  ? ALKPHOS 85 07/29/2021 0909  ? AST 24 07/29/2021 0909  ? ALT 2

## 2021-08-05 NOTE — Patient Instructions (Signed)

## 2021-08-08 ENCOUNTER — Other Ambulatory Visit: Payer: Self-pay | Admitting: Family

## 2021-08-08 ENCOUNTER — Inpatient Hospital Stay: Payer: Medicare Other | Attending: Physician Assistant

## 2021-08-08 ENCOUNTER — Inpatient Hospital Stay: Payer: Medicare Other

## 2021-08-08 VITALS — BP 128/68 | HR 101 | Resp 17

## 2021-08-08 DIAGNOSIS — E1165 Type 2 diabetes mellitus with hyperglycemia: Secondary | ICD-10-CM | POA: Insufficient documentation

## 2021-08-08 DIAGNOSIS — Z794 Long term (current) use of insulin: Secondary | ICD-10-CM | POA: Diagnosis not present

## 2021-08-08 DIAGNOSIS — Z5112 Encounter for antineoplastic immunotherapy: Secondary | ICD-10-CM | POA: Diagnosis not present

## 2021-08-08 DIAGNOSIS — E860 Dehydration: Secondary | ICD-10-CM | POA: Diagnosis not present

## 2021-08-08 DIAGNOSIS — Z87891 Personal history of nicotine dependence: Secondary | ICD-10-CM | POA: Insufficient documentation

## 2021-08-08 DIAGNOSIS — C8330 Diffuse large B-cell lymphoma, unspecified site: Secondary | ICD-10-CM | POA: Diagnosis not present

## 2021-08-08 DIAGNOSIS — Z5111 Encounter for antineoplastic chemotherapy: Secondary | ICD-10-CM | POA: Insufficient documentation

## 2021-08-08 DIAGNOSIS — Z5189 Encounter for other specified aftercare: Secondary | ICD-10-CM | POA: Diagnosis not present

## 2021-08-08 DIAGNOSIS — I714 Abdominal aortic aneurysm, without rupture, unspecified: Secondary | ICD-10-CM | POA: Diagnosis not present

## 2021-08-08 DIAGNOSIS — C8338 Diffuse large B-cell lymphoma, lymph nodes of multiple sites: Secondary | ICD-10-CM

## 2021-08-08 MED ORDER — PEGFILGRASTIM-BMEZ 6 MG/0.6ML ~~LOC~~ SOSY
6.0000 mg | PREFILLED_SYRINGE | Freq: Once | SUBCUTANEOUS | Status: AC
  Administered 2021-08-08: 6 mg via SUBCUTANEOUS
  Filled 2021-08-08: qty 0.6

## 2021-08-08 MED ORDER — SODIUM CHLORIDE 0.9% FLUSH
10.0000 mL | Freq: Once | INTRAVENOUS | Status: AC
  Administered 2021-08-08: 10 mL via INTRAVENOUS

## 2021-08-08 MED ORDER — HEPARIN SOD (PORK) LOCK FLUSH 100 UNIT/ML IV SOLN
500.0000 [IU] | Freq: Once | INTRAVENOUS | Status: AC
  Administered 2021-08-08: 500 [IU] via INTRAVENOUS

## 2021-08-08 MED ORDER — SODIUM CHLORIDE 0.9 % IV SOLN: Freq: Once | INTRAVENOUS | Status: AC

## 2021-08-08 NOTE — Patient Instructions (Signed)

## 2021-08-10 ENCOUNTER — Telehealth: Payer: Self-pay | Admitting: Emergency Medicine

## 2021-08-10 NOTE — Telephone Encounter (Signed)
I cancelled the appointment for him.  ?

## 2021-08-10 NOTE — Telephone Encounter (Signed)
Called and spoke with patient's wife Butch Penny. She stated that the patient had a PET scan done on 08/05/21 that was ordered by Dr. Marin Olp. Patient is scheduled for a super D CT tomorrow at 1020am. She wanted to know if it was necessary for him to have the CT scan. She is aware that I will double check with RB.  ? ?Spoke with RB verbally about patient. He viewed the PET scan from 07/26/21 and stated that the patient did not need the CT for tomorrow.  ? ?Called and spoke with Butch Penny again. She is aware that the CT scan is not needed.  ? ?PCCs, can we cancel the Super D scheduled for tomorrow? Thank you!  ? ? ?

## 2021-08-11 ENCOUNTER — Inpatient Hospital Stay: Admission: RE | Admit: 2021-08-11 | Payer: Medicare Other | Source: Ambulatory Visit

## 2021-08-13 ENCOUNTER — Inpatient Hospital Stay (HOSPITAL_COMMUNITY)
Admission: EM | Admit: 2021-08-13 | Discharge: 2021-08-17 | DRG: 698 | Disposition: A | Discharge status: Home / Self Care | Payer: Medicare Other | Attending: Internal Medicine | Admitting: Internal Medicine

## 2021-08-13 ENCOUNTER — Encounter (HOSPITAL_COMMUNITY): Payer: Self-pay

## 2021-08-13 ENCOUNTER — Other Ambulatory Visit: Payer: Self-pay

## 2021-08-13 ENCOUNTER — Emergency Department (HOSPITAL_COMMUNITY): Payer: Medicare Other

## 2021-08-13 DIAGNOSIS — Z87891 Personal history of nicotine dependence: Secondary | ICD-10-CM | POA: Diagnosis not present

## 2021-08-13 DIAGNOSIS — D61818 Other pancytopenia: Secondary | ICD-10-CM | POA: Diagnosis not present

## 2021-08-13 DIAGNOSIS — D61810 Antineoplastic chemotherapy induced pancytopenia: Secondary | ICD-10-CM | POA: Diagnosis not present

## 2021-08-13 DIAGNOSIS — D709 Neutropenia, unspecified: Principal | ICD-10-CM

## 2021-08-13 DIAGNOSIS — W1830XA Fall on same level, unspecified, initial encounter: Secondary | ICD-10-CM | POA: Diagnosis present

## 2021-08-13 DIAGNOSIS — E119 Type 2 diabetes mellitus without complications: Secondary | ICD-10-CM

## 2021-08-13 DIAGNOSIS — E1165 Type 2 diabetes mellitus with hyperglycemia: Secondary | ICD-10-CM | POA: Diagnosis not present

## 2021-08-13 DIAGNOSIS — J449 Chronic obstructive pulmonary disease, unspecified: Secondary | ICD-10-CM | POA: Diagnosis not present

## 2021-08-13 DIAGNOSIS — N3000 Acute cystitis without hematuria: Secondary | ICD-10-CM | POA: Diagnosis not present

## 2021-08-13 DIAGNOSIS — F0393 Unspecified dementia, unspecified severity, with mood disturbance: Secondary | ICD-10-CM | POA: Diagnosis present

## 2021-08-13 DIAGNOSIS — E222 Syndrome of inappropriate secretion of antidiuretic hormone: Secondary | ICD-10-CM | POA: Diagnosis not present

## 2021-08-13 DIAGNOSIS — K219 Gastro-esophageal reflux disease without esophagitis: Secondary | ICD-10-CM | POA: Diagnosis not present

## 2021-08-13 DIAGNOSIS — N3001 Acute cystitis with hematuria: Secondary | ICD-10-CM | POA: Diagnosis not present

## 2021-08-13 DIAGNOSIS — Z800 Family history of malignant neoplasm of digestive organs: Secondary | ICD-10-CM

## 2021-08-13 DIAGNOSIS — A4150 Gram-negative sepsis, unspecified: Secondary | ICD-10-CM

## 2021-08-13 DIAGNOSIS — N390 Urinary tract infection, site not specified: Secondary | ICD-10-CM | POA: Diagnosis present

## 2021-08-13 DIAGNOSIS — E860 Dehydration: Secondary | ICD-10-CM | POA: Diagnosis present

## 2021-08-13 DIAGNOSIS — F32A Depression, unspecified: Secondary | ICD-10-CM | POA: Diagnosis present

## 2021-08-13 DIAGNOSIS — T451X5A Adverse effect of antineoplastic and immunosuppressive drugs, initial encounter: Secondary | ICD-10-CM | POA: Diagnosis present

## 2021-08-13 DIAGNOSIS — Y846 Urinary catheterization as the cause of abnormal reaction of the patient, or of later complication, without mention of misadventure at the time of the procedure: Secondary | ICD-10-CM | POA: Diagnosis present

## 2021-08-13 DIAGNOSIS — R9431 Abnormal electrocardiogram [ECG] [EKG]: Secondary | ICD-10-CM | POA: Diagnosis present

## 2021-08-13 DIAGNOSIS — Z8371 Family history of colonic polyps: Secondary | ICD-10-CM | POA: Diagnosis not present

## 2021-08-13 DIAGNOSIS — C8510 Unspecified B-cell lymphoma, unspecified site: Secondary | ICD-10-CM

## 2021-08-13 DIAGNOSIS — E785 Hyperlipidemia, unspecified: Secondary | ICD-10-CM | POA: Diagnosis present

## 2021-08-13 DIAGNOSIS — I10 Essential (primary) hypertension: Secondary | ICD-10-CM | POA: Diagnosis present

## 2021-08-13 DIAGNOSIS — Z9889 Other specified postprocedural states: Secondary | ICD-10-CM | POA: Diagnosis not present

## 2021-08-13 DIAGNOSIS — R5081 Fever presenting with conditions classified elsewhere: Secondary | ICD-10-CM | POA: Diagnosis not present

## 2021-08-13 DIAGNOSIS — Z8249 Family history of ischemic heart disease and other diseases of the circulatory system: Secondary | ICD-10-CM

## 2021-08-13 DIAGNOSIS — R55 Syncope and collapse: Secondary | ICD-10-CM | POA: Diagnosis present

## 2021-08-13 DIAGNOSIS — Z794 Long term (current) use of insulin: Secondary | ICD-10-CM

## 2021-08-13 DIAGNOSIS — Y92009 Unspecified place in unspecified non-institutional (private) residence as the place of occurrence of the external cause: Secondary | ICD-10-CM

## 2021-08-13 DIAGNOSIS — I480 Paroxysmal atrial fibrillation: Secondary | ICD-10-CM | POA: Diagnosis present

## 2021-08-13 DIAGNOSIS — Z8616 Personal history of COVID-19: Secondary | ICD-10-CM

## 2021-08-13 DIAGNOSIS — Z8572 Personal history of non-Hodgkin lymphomas: Secondary | ICD-10-CM | POA: Diagnosis not present

## 2021-08-13 DIAGNOSIS — Z978 Presence of other specified devices: Secondary | ICD-10-CM

## 2021-08-13 DIAGNOSIS — T83518A Infection and inflammatory reaction due to other urinary catheter, initial encounter: Principal | ICD-10-CM | POA: Diagnosis present

## 2021-08-13 DIAGNOSIS — E871 Hypo-osmolality and hyponatremia: Secondary | ICD-10-CM | POA: Diagnosis present

## 2021-08-13 DIAGNOSIS — D849 Immunodeficiency, unspecified: Secondary | ICD-10-CM | POA: Diagnosis not present

## 2021-08-13 DIAGNOSIS — C8330 Diffuse large B-cell lymphoma, unspecified site: Secondary | ICD-10-CM | POA: Diagnosis not present

## 2021-08-13 HISTORY — DX: Gram-negative sepsis, unspecified: N39.0

## 2021-08-13 HISTORY — DX: Urinary tract infection, site not specified: A41.50

## 2021-08-13 HISTORY — DX: Neutropenia, unspecified: D70.9

## 2021-08-13 LAB — URINALYSIS, ROUTINE W REFLEX MICROSCOPIC
Bilirubin Urine: NEGATIVE
Bilirubin Urine: NEGATIVE
Glucose, UA: >=500 mg/dL — AB
Glucose, UA: >=500 mg/dL — AB
Hgb urine dipstick: NEGATIVE
Hgb urine dipstick: NEGATIVE
Ketones, ur: 5 mg/dL — AB
Ketones, ur: 5 mg/dL — AB
Nitrite: POSITIVE — AB
Nitrite: POSITIVE — AB
Protein, ur: NEGATIVE mg/dL
Protein, ur: NEGATIVE mg/dL
RBC / HPF: >50 RBC/hpf — ABNORMAL HIGH (ref 0–5)
RBC / HPF: >50 RBC/hpf — ABNORMAL HIGH (ref 0–5)
Specific Gravity, Urine: 1.02 (ref 1.005–1.030)
Specific Gravity, Urine: 1.008 (ref 1.005–1.030)
WBC, UA: >50 WBC/hpf — ABNORMAL HIGH (ref 0–5)
pH: 5 (ref 5.0–8.0)
pH: 6 (ref 5.0–8.0)

## 2021-08-13 LAB — MAGNESIUM: Magnesium: 1.9 mg/dL (ref 1.7–2.4)

## 2021-08-13 LAB — CBC WITH DIFFERENTIAL/PLATELET
Abs Immature Granulocytes: 0.06 10*3/uL (ref 0.00–0.07)
Basophils Absolute: 0 10*3/uL (ref 0.0–0.1)
Basophils Relative: 1 %
Eosinophils Absolute: 0 10*3/uL (ref 0.0–0.5)
Eosinophils Relative: 2 %
HCT: 29.7 % — ABNORMAL LOW (ref 39.0–52.0)
Hemoglobin: 10.4 g/dL — ABNORMAL LOW (ref 13.0–17.0)
Immature Granulocytes: 4 %
Lymphocytes Relative: 16 %
Lymphs Abs: 0.3 10*3/uL — ABNORMAL LOW (ref 0.7–4.0)
MCH: 30.1 pg (ref 26.0–34.0)
MCHC: 35 g/dL (ref 30.0–36.0)
MCV: 86.1 fL (ref 80.0–100.0)
Monocytes Absolute: 0.1 10*3/uL (ref 0.1–1.0)
Monocytes Relative: 6 %
Neutro Abs: 1.2 10*3/uL — ABNORMAL LOW (ref 1.7–7.7)
Neutrophils Relative %: 71 %
Platelets: 54 10*3/uL — ABNORMAL LOW (ref 150–400)
RBC: 3.45 MIL/uL — ABNORMAL LOW (ref 4.22–5.81)
RDW: 15.8 % — ABNORMAL HIGH (ref 11.5–15.5)
WBC: 1.7 10*3/uL — ABNORMAL LOW (ref 4.0–10.5)
nRBC: 0 % (ref 0.0–0.2)

## 2021-08-13 LAB — COMPREHENSIVE METABOLIC PANEL
ALT: 24 U/L (ref 0–44)
AST: 14 U/L — ABNORMAL LOW (ref 15–41)
Albumin: 3.4 g/dL — ABNORMAL LOW (ref 3.5–5.0)
Alkaline Phosphatase: 76 U/L (ref 38–126)
Anion gap: 9 (ref 5–15)
BUN: 28 mg/dL — ABNORMAL HIGH (ref 8–23)
CO2: 23 mmol/L (ref 22–32)
Calcium: 8.5 mg/dL — ABNORMAL LOW (ref 8.9–10.3)
Chloride: 96 mmol/L — ABNORMAL LOW (ref 98–111)
Creatinine, Ser: 0.71 mg/dL (ref 0.61–1.24)
GFR, Estimated: >60 mL/min (ref >60)
Glucose, Bld: 325 mg/dL — ABNORMAL HIGH (ref 70–99)
Potassium: 4.2 mmol/L (ref 3.5–5.1)
Sodium: 128 mmol/L — ABNORMAL LOW (ref 135–145)
Total Bilirubin: 1 mg/dL (ref 0.3–1.2)
Total Protein: 6.6 g/dL (ref 6.5–8.1)

## 2021-08-13 LAB — LIPASE, BLOOD: Lipase: 31 U/L (ref 11–51)

## 2021-08-13 LAB — GLUCOSE, CAPILLARY: Glucose-Capillary: 221 mg/dL — ABNORMAL HIGH (ref 70–99)

## 2021-08-13 LAB — LACTIC ACID, PLASMA: Lactic Acid, Venous: 1.2 mmol/L (ref 0.5–1.9)

## 2021-08-13 MED ORDER — FAMCICLOVIR 500 MG PO TABS
250.0000 mg | ORAL_TABLET | Freq: Two times a day (BID) | ORAL | Status: DC
  Administered 2021-08-13 – 2021-08-17 (×8): 250 mg via ORAL
  Filled 2021-08-13 (×9): qty 0.5

## 2021-08-13 MED ORDER — ALBUTEROL SULFATE (2.5 MG/3ML) 0.083% IN NEBU: 2.5000 mg | INHALATION_SOLUTION | RESPIRATORY_TRACT | Status: DC | PRN

## 2021-08-13 MED ORDER — VANCOMYCIN HCL IN DEXTROSE 1-5 GM/200ML-% IV SOLN
1000.0000 mg | Freq: Once | INTRAVENOUS | Status: AC
  Administered 2021-08-13: 1000 mg via INTRAVENOUS
  Filled 2021-08-13 (×2): qty 200

## 2021-08-13 MED ORDER — ARFORMOTEROL TARTRATE 15 MCG/2ML IN NEBU
15.0000 ug | INHALATION_SOLUTION | Freq: Two times a day (BID) | RESPIRATORY_TRACT | Status: DC
  Administered 2021-08-14 – 2021-08-17 (×5): 15 ug via RESPIRATORY_TRACT
  Filled 2021-08-13 (×10): qty 2

## 2021-08-13 MED ORDER — INSULIN ASPART 100 UNIT/ML IJ SOLN
0.0000 [IU] | Freq: Every day | INTRAMUSCULAR | Status: DC
  Administered 2021-08-13: 2 [IU] via SUBCUTANEOUS
  Administered 2021-08-14 – 2021-08-15 (×2): 4 [IU] via SUBCUTANEOUS
  Administered 2021-08-16: 3 [IU] via SUBCUTANEOUS
  Filled 2021-08-13: qty 0.05

## 2021-08-13 MED ORDER — INSULIN GLARGINE-YFGN 100 UNIT/ML ~~LOC~~ SOLN
13.0000 [IU] | Freq: Every day | SUBCUTANEOUS | Status: DC
  Administered 2021-08-13 – 2021-08-14 (×2): 13 [IU] via SUBCUTANEOUS
  Filled 2021-08-13 (×3): qty 0.13

## 2021-08-13 MED ORDER — TAMSULOSIN HCL 0.4 MG PO CAPS
0.4000 mg | ORAL_CAPSULE | Freq: Every evening | ORAL | Status: DC
  Administered 2021-08-14 – 2021-08-16 (×3): 0.4 mg via ORAL
  Filled 2021-08-13 (×3): qty 1

## 2021-08-13 MED ORDER — VANCOMYCIN HCL IN DEXTROSE 1-5 GM/200ML-% IV SOLN
1000.0000 mg | Freq: Two times a day (BID) | INTRAVENOUS | Status: DC
  Administered 2021-08-14 – 2021-08-15 (×3): 1000 mg via INTRAVENOUS
  Filled 2021-08-13 (×2): qty 200

## 2021-08-13 MED ORDER — PANTOPRAZOLE SODIUM 40 MG PO TBEC
40.0000 mg | DELAYED_RELEASE_TABLET | Freq: Every day | ORAL | Status: DC
  Administered 2021-08-14 – 2021-08-17 (×4): 40 mg via ORAL
  Filled 2021-08-13 (×4): qty 1

## 2021-08-13 MED ORDER — ACETAMINOPHEN 325 MG PO TABS
650.0000 mg | ORAL_TABLET | Freq: Four times a day (QID) | ORAL | Status: DC | PRN
Start: 2021-08-13 — End: 2021-08-17
  Administered 2021-08-15 – 2021-08-16 (×2): 650 mg via ORAL
  Filled 2021-08-13: qty 2

## 2021-08-13 MED ORDER — ACETAMINOPHEN 650 MG RE SUPP: 650.0000 mg | Freq: Four times a day (QID) | RECTAL | Status: DC | PRN

## 2021-08-13 MED ORDER — CALCIUM CARBONATE 1250 (500 CA) MG PO TABS
500.0000 mg | ORAL_TABLET | Freq: Every day | ORAL | Status: DC
  Administered 2021-08-14 – 2021-08-17 (×4): 500 mg via ORAL
  Filled 2021-08-13 (×4): qty 1

## 2021-08-13 MED ORDER — ASPIRIN EC 81 MG PO TBEC
81.0000 mg | DELAYED_RELEASE_TABLET | Freq: Every day | ORAL | Status: DC
  Administered 2021-08-14 – 2021-08-17 (×4): 81 mg via ORAL
  Filled 2021-08-13 (×4): qty 1

## 2021-08-13 MED ORDER — SODIUM CHLORIDE 0.9 % IV BOLUS
1000.0000 mL | Freq: Once | INTRAVENOUS | Status: AC
  Administered 2021-08-13: 1000 mL via INTRAVENOUS

## 2021-08-13 MED ORDER — ROSUVASTATIN CALCIUM 20 MG PO TABS
10.0000 mg | ORAL_TABLET | Freq: Every day | ORAL | Status: DC
  Administered 2021-08-14 – 2021-08-17 (×4): 10 mg via ORAL
  Filled 2021-08-13 (×4): qty 1

## 2021-08-13 MED ORDER — INSULIN ASPART 100 UNIT/ML IJ SOLN
0.0000 [IU] | Freq: Three times a day (TID) | INTRAMUSCULAR | Status: DC
  Administered 2021-08-14 (×2): 3 [IU] via SUBCUTANEOUS
  Administered 2021-08-14: 5 [IU] via SUBCUTANEOUS
  Administered 2021-08-15: 7 [IU] via SUBCUTANEOUS
  Administered 2021-08-15 (×2): 5 [IU] via SUBCUTANEOUS
  Administered 2021-08-16: 7 [IU] via SUBCUTANEOUS
  Filled 2021-08-13: qty 0.09

## 2021-08-13 MED ORDER — POTASSIUM CHLORIDE CRYS ER 20 MEQ PO TBCR
20.0000 meq | EXTENDED_RELEASE_TABLET | Freq: Every day | ORAL | Status: DC
  Administered 2021-08-14 – 2021-08-17 (×4): 20 meq via ORAL
  Filled 2021-08-13 (×4): qty 1

## 2021-08-13 MED ORDER — SODIUM CHLORIDE 0.9 % IV SOLN
1.0000 g | Freq: Three times a day (TID) | INTRAVENOUS | Status: DC
  Administered 2021-08-13 – 2021-08-16 (×8): 1 g via INTRAVENOUS
  Filled 2021-08-13: qty 1
  Filled 2021-08-13 (×5): qty 20
  Filled 2021-08-13: qty 1
  Filled 2021-08-13: qty 20
  Filled 2021-08-13: qty 1

## 2021-08-13 MED ORDER — ONDANSETRON HCL 4 MG/2ML IJ SOLN: 4.0000 mg | Freq: Four times a day (QID) | INTRAMUSCULAR | Status: DC | PRN

## 2021-08-13 MED ORDER — SODIUM CHLORIDE 0.9 % IV SOLN: INTRAVENOUS | Status: DC

## 2021-08-13 NOTE — Assessment & Plan Note (Signed)
?#)   COPD: Documented history of such in the context of a long prior smoking history with greater than 25-pack-year history noted to have complete patient completely smoking in 2019.  No clinical evidence to suggest acute exacerbation at this time.  Outpatient respiratory regimen consists of scheduled solumedrol as well as as needed albuterol inhaler.  ? ?Plan: Continue outpatient respiratory regimen. ? ? ?

## 2021-08-13 NOTE — Assessment & Plan Note (Signed)
? ? ?#)   Syncope: patient reports experiencing 1 episode of syncope today that occurred when he was moving from a seated to standing position and associated with prodrome, suggestive of potential orthostatic hypotension in the setting of relative dehydration as evidenced by acute prerenal azotemia, diminished oral intake, and exacerbated by home pharmacologic factors serving to diminish compensatory tachycardic response in the setting of outpatient tunnel. Will check orthostatic vital signs, but with the caveat that the patient has already received IVF's in the ED, potentially altering the results of this evaluation.  Of note, the patient has experienced 1 similar episode of syncope approximately 7 days after each of his previous 3 cycles of R-CHOP. ? ?Not associated with any overt acute focal neurologic deficits. Clinically, acute ischemic stroke versus seizures appear less likely at this time. Presentation appears less consistent with ACS at this time, with presenting EKG showing no evidence of acute ischemic changes, and the absence of any associated CP.  This was a witnessed syncopal event, with wife confirming that the patient did not hit his head as component of the sequence.  ? ?In setting of associated prodrome, the possibility of ventricular arrhythmia appears to be less likely at this time, although will closely monitor on telemetry for any e/o potential contributory arrhythmia, while also assessing serum Mg level.  ? ?  ? ?Plan: I have placed a nursing communication order requesting that orthostatic vital signs x 1 set be checked and documented.  Monitor on telemetry. Hold home BB for now.   Monitor strict I's and O's.  Add-on serum Mg level. Check CMP, CBC, serum Mg level in the AM.  In the setting of prolonged QTc, will hold home Cymbalta for now, and repeat EKG in the morning. ? ? ?

## 2021-08-13 NOTE — Progress Notes (Signed)
Pharmacy Antibiotic Note ? ?Aaron CLEMENCE Sr. is a 66 y.o. male admitted on 08/13/2021 with neutropenic fever . PT with medical history significant for B-cell lymphoma undergoing R-CHOP, COPD, depression, dementia, essential hypertension, type 2 diabetes mellitus, chronic hyponatremia. Pharmacy has been consulted for vancomycin dosing. ? ?Plan: ?Vancomycin 1gm IV x 1 then 1gm q12h (AUC 468.7, used TBW, used Scr 0.8) ?Merrem 1gm IV q8h per MD ?Follow renal function, cultures and clinical course ? ?Height: 6' (182.9 cm) ?Weight: 73 kg (160 lb 15 oz) ?IBW/kg (Calculated) : 77.6 ? ?Temp (24hrs), Avg:99 ?F (37.2 ?C), Min:98.6 ?F (37 ?C), Max:99.2 ?F (37.3 ?C) ? ?Recent Labs  ?Lab 08/13/21 ?1852 08/13/21 ?2044  ?WBC 1.7*  --   ?CREATININE 0.71  --   ?LATICACIDVEN  --  1.2  ?  ?Estimated Creatinine Clearance: 92.5 mL/min (by C-G formula based on SCr of 0.71 mg/dL).   ? ?Allergies  ?Allergen Reactions  ? Fexofenadine Other (See Comments)  ?  "dries me out too much"  ? Hydrochlorothiazide Other (See Comments)  ?  Cramps  ? Penicillins Hives  ? Pravastatin Sodium Other (See Comments)  ?  Aches  ? Requip [Ropinirole Hcl] Other (See Comments)  ?  Bad dreams  ? ? ?Antimicrobials this admission: ?4/8 vanc >> ?4/8 merrem >> ? ?Dose adjustments this admission: ? ? ?Microbiology results: ?4/8 BCx:  ?4/8 UCx:   ? ? ?Thank you for allowing pharmacy to be a part of this patient?s care. ? ?Dolly Rias RPh ?08/13/2021, 11:34 PM ? ?

## 2021-08-13 NOTE — Assessment & Plan Note (Signed)
? ?#)  Sepsis due to urinary tract infection: In the setting of presenting neutropenic fever there is suspicion underlying urinary tract infection in the setting of significant pyuria, many bacteria, nitrate positive/large leukocyte esterase, without any squamous epithelial cells to suggest a contaminated specimen.  However discussed with caveat of the specimen obtained via chronic indwelling Foley catheter.  Regardless, in the setting of this immunocompromised patient on chemotherapy who is being admitted for neutropenic fever, he will continue to receive broad-spectrum IV antibiotics per recommendation of consulted heme-onc, as above.  ? ?SIRS criteria met via presenting leukopenia, tachypnea. Of note, in the absence of any evidence of end organ damage, including a non-elevated presenting LA of 1.2, pt's sepsis does not meet criteria to be considered severe in nature. Also, in the absence of LA level greater than or equal to 4.0, and in the absence of any associated hypotension refractory to IVF's, there are no indications for administration of a 30 mL/kg IVF bolus at this time.  ? ?Additional ED work-up/management notable for: Blood cultures x2 urine culture collected prior to initiation of IV Vanco and meropenem. ? ? ? ?Plan: CBC w/ diff and CMP in AM. Follow for results of blood cx's x 2, as well as urine culture. Abx: Continue meropenem and IV vancomycin per recommendation of heme-onc consultation, as above.  Further evaluation and management of presenting neutropenic fever, as outlined above. ? ?

## 2021-08-13 NOTE — Assessment & Plan Note (Signed)
? ?#)   Hyperlipidemia: documented h/o such. On rosuvastatin as outpatient.    Plan: continue home statin.   

## 2021-08-13 NOTE — Assessment & Plan Note (Signed)
? ?#)   QTc prolongation: Presenting EKG demonstrates QTc of 533 ms. outpatient medications that may be contributing to QTc prolongation: Cymbalta.  ? ? ?Plan: Monitor on telemetry.  Add-on serum magnesium level.  Repeat EKG in the morning to monitor interval degree of QTc prolongation.  Hold outpatient Cymbalta. ? ? ? ?

## 2021-08-13 NOTE — ED Triage Notes (Signed)
Pt came in for a fall at home. Pt was dizzy then fainted. He usually has the same episode 10 day's after chemo. Pt has a hx of lymphoma cancer. He did not hit is head and there are no signs of injury. ?

## 2021-08-13 NOTE — Assessment & Plan Note (Signed)
#)   GERD: documented h/o such; on omeprazole as outpatient.  ?  ?Plan: continue home PPI.  ?  ?

## 2021-08-13 NOTE — Assessment & Plan Note (Signed)
? ?#)   Neutropenic fever: In the context of B-cell lymphoma she is undergoing R-CHOP, with most recent session completed around the patient presents with 1 day of elevated temperature associated with reported temperature max of 100.0 at home, with presenting CBC white blood cell count of 1700 with slight neutrophil count of 1200.  ? ?patient's case was d/w  on-call heme-onc physician, Dr. Alvy Bimler, Who recommended that presentation be considered neutropenic fever.  Consequently, she recommended  admission to the hospitalist service for further evaluation and management of neutropenic fever, with specific recommendations for antibiotic coverage in the form of meropenem and IV vancomycin.  Dr. Alvy Bimler to formally consult, and will see the patient in the morning. ? ?Potential underlying urinary tract, as further detailed below, in the context of chronic indwelling Foley catheter.  Otherwise, no overt source of underlying infection, chest x-ray showing no evidence of acute cardiopulmonary process.  Of note, patient had recent COVID-19 positive finding in the absence of any acute respiratory symptoms, will refrain from rechecking COVID PCR at this time due to risk for false positive finding in this setting.  Was recently diagnosed with cellulitis of the left elbow, which appears to be continued to improve following recently completed course of doxycycline, as further detailed above.  No evidence of cellulitis at this time. ? ?Blood cultures x2 collected in ED this evening along with urine culture, prior to initiation of meropenem and IV vancomycin per recommendations of heme-onc. ? ?  ?Plan: Heme-onc consulted, as above, with plan to evaluate the patient in the morning.  Continue IV vancomycin and meropenem per heme-onc recommendations.  Monitor for results of blood cultures x2 as well as urine culture.  Repeat CBC with differential in the morning.  Neutropenic precautions.  No DRE's. ? ? ?

## 2021-08-13 NOTE — ED Provider Notes (Addendum)
?[facility] DEPT ?Provider Note ? ? ?CSN: [id number] ?Arrival date & time: 08/13/21  1824 ? ?  ? ?History ? ?Chief Complaint  ?Patient presents with  ? Near Syncope  ? Fall  ? ? ?Aaron BUTNER Sr. is a 66 y.o. male. ? ?Patient here today for a syncopal episode at home.  He fainted but did not hurt anything did not hit his head.  He has been feeling dizzy all day.  This is common about 10 days after his chemo treatments.  Patient is followed by Dr. Marin Olp he has large B-cell lymphoma.  He has had 4 cycles of chemo R-CHOP he would have received Neulasta somewhere around April 2 or 3.  Patient states that normally about 10 days after the chemo he has symptoms of being lightheaded and feeling like he is going to pass out.  Sometimes he is admitted sometimes he is not.  He states that he is felt as if he has been having fevers.  Temp here 99.2 heart rate 101 respirations 27 oxygen sats 91% respiratory 27. ? ?In addition patient had an infection to the left elbow.  I had that drained cultures were negative seen by Dr. Maryagnes Amos off from Ortho who drained it.  Patient's been on doxycycline he finished that up on Wednesday.  Patient still has some redness to the left elbow.  Patient states that it is not any worse. ? ?Patient's had longstanding indwelling Foley catheter now for several months.  Patient has a Port-A-Cath in the right anterior chest area. ? ?Past medical history also significant for COPD type 2 diabetes abdominal aortic aneurysm gastroesophageal reflux disease. ? ? ?  ? ?Home Medications ?Prior to Admission medications   ?Medication Sig Start Date End Date Taking? Authorizing Provider  ?acetaminophen (TYLENOL) 325 MG tablet Take 650 mg by mouth every 6 (six) hours as needed for mild pain or headache.   Yes [provider]  ?albuterol (PROAIR HFA) 108 (90 Base) MCG/ACT inhaler Inhale 1-2 puffs into the lungs every 6 (six) hours as needed for wheezing or shortness of breath.  10/15/20  Yes Collene Gobble, MD  ?alendronate (FOSAMAX) 70 MG tablet Take 70 mg by mouth every Sunday. 02/12/20  Yes [provider]  ?aspirin EC 81 MG EC tablet Take 1 tablet (81 mg total) by mouth daily at 6 (six) AM. Swallow whole. ?Patient taking differently: Take 81 mg by mouth in the morning. Swallow whole. 07/14/20  Yes Ulyses Amor, PA-C  ?atenolol (TENORMIN) 100 MG tablet Take 100 mg by mouth daily. 09/02/20  Yes [provider]  ?calcium carbonate (OSCAL) 1500 (600 Ca) MG TABS tablet Take 600 mg of elemental calcium by mouth daily with breakfast.   Yes [provider]  ?diclofenac Sodium (VOLTAREN) 1 % GEL Apply 2 g topically daily as needed (to painful sites).   Yes [provider]  ?DULoxetine (CYMBALTA) 60 MG capsule Take 60 mg by mouth at bedtime. 04/01/18  Yes [provider]  ?famciclovir (FAMVIR) 250 MG tablet Take 1 tablet (250 mg total) by mouth 2 (two) times daily. 06/09/21  Yes [name], MD  ?Fish Oil OIL Take 1,500 mg by mouth daily.   Yes [provider]  ?insulin glargine (LANTUS) 100 UNIT/ML Solostar Pen Inject 25 Units into the skin daily. 06/30/21  Yes Hosie Poisson, MD  ?insulin lispro (HUMALOG) 100 UNIT/ML KwikPen Inject 2-15 Units into the skin 4 (four) times daily - after meals and  at bedtime. Glucose 121 - 150: 2 units, Glucose 151 - 200: 3 units, Glucose 201 - 250: 5 units, Glucose 251 - 300: 8 units, Glucose 301 - 350: 11 units, Glucose 351 - 400: 15 units, Glucose > 400 call MD 06/08/21  Yes Dwyane Dee, MD  ?omeprazole (PRILOSEC) 40 MG capsule Take 40 mg by mouth every evening.   Yes [provider]  ?ondansetron (ZOFRAN) 8 MG tablet Take 1 tablet (8 mg total) by mouth 2 (two) times daily as needed for refractory nausea / vomiting. Start on day 3 after cyclophosphamide chemotherapy. ?Patient taking differently: Take 8 mg by mouth 2 (two) times daily as needed for refractory nausea / vomiting. 05/17/21  Yes  Ennever, Rudell Cobb, MD  ?potassium chloride SA (KLOR-CON M20) 20 MEQ tablet TAKE 1 TABLET (20 MEQ TOTAL) BY MOUTH DAILY.  ?Patient taking differently: Take 20 mEq by mouth in the morning. 02/13/13  Yes Plotnikov, Evie Lacks, MD  ?rosuvastatin (CRESTOR) 10 MG tablet Take 10 mg by mouth daily. 03/16/20  Yes [provider]  ?SEREVENT DISKUS 50 MCG/ACT diskus inhaler Inhale 1 puff into the lungs 2 (two) times daily. 05/25/21  Yes [provider]  ?tadalafil (CIALIS) 5 MG tablet Take 5 mg by mouth in the morning.   Yes [provider]  ?tamsulosin (FLOMAX) 0.4 MG CAPS capsule Take 0.4 mg by mouth every evening.   Yes [provider]  ?Zinc 50 MG TABS Take 50 mg by mouth daily.   Yes [provider]  ?allopurinol (ZYLOPRIM) 100 MG tablet Take 1 tablet (100 mg total) by mouth daily. Start 3 days BEFORE chemotherapy starts ?Patient not taking: Reported on 08/13/2021 06/09/21   [name], MD  ?blood glucose meter kit and supplies KIT Dispense based on patient and insurance preference. Use up to four times daily as directed. 06/08/21   Dwyane Dee, MD  ?Continuous Blood Gluc Receiver (FREESTYLE LIBRE 2 READER) DEVI  06/16/21   [provider]  ?Continuous Blood Gluc Sensor (FREESTYLE LIBRE 14 DAY SENSOR) MISC Inject 1 Device into the skin every 14 (fourteen) days.    [provider]  ?doxycycline (VIBRA-TABS) 100 MG tablet Take 1 tablet (100 mg total) by mouth 2 (two) times daily. ?Patient not taking: Reported on 08/13/2021 07/18/21   [name], MD  ?Insulin Pen Needle 32G X 4 MM MISC 1 each by Does not apply route 4 (four) times daily - after meals and at bedtime. 06/08/21   Dwyane Dee, MD  ?LORazepam (ATIVAN) 0.5 MG tablet Take 1 tablet (0.5 mg total) by mouth every 8 (eight) hours. Take for nausea prn ?Patient not taking: Reported on 07/18/2021 05/17/21   [name], MD  ?nitrofurantoin, macrocrystal-monohydrate, (MACROBID) 100 MG capsule Take 100 mg by  mouth 2 (two) times daily. ?Patient not taking: Reported on 08/13/2021 07/15/21   [provider]  ?prochlorperazine (COMPAZINE) 10 MG tablet Take 1 tablet (10 mg total) by mouth every 6 (six) hours as needed (Nausea or vomiting). ?Patient not taking: Reported on 07/18/2021 05/17/21   [name], MD  ?   ? ?Allergies    ?Fexofenadine, Hydrochlorothiazide, Penicillins, Pravastatin sodium, and Requip [ropinirole hcl]   ? ?Review of Systems   ?Review of Systems  ?Constitutional:  Positive for fever. Negative for chills.  ?HENT:  Negative for ear pain and sore throat.   ?Eyes:  Negative for pain and visual disturbance.  ?Respiratory:  Negative for cough and shortness of breath.   ?  Cardiovascular:  Negative for chest pain and palpitations.  ?Gastrointestinal:  Negative for abdominal pain and vomiting.  ?Genitourinary:  Negative for dysuria and hematuria.  ?Musculoskeletal:  Positive for joint swelling. Negative for arthralgias and back pain.  ?Skin:  Negative for color change and rash.  ?Neurological:  Positive for syncope and light-headedness. Negative for seizures.  ?Hematological:  Does not bruise/bleed easily.  ?All other systems reviewed and are negative. ? ?Physical Exam ?Updated Vital Signs ?BP 119/74   Pulse 86   Temp 99.2 ?F (37.3 ?C) (Oral)   Resp 19   Ht 1.829 m (6')   Wt 73 kg   SpO2 95%   BMI 21.83 kg/m?  ?Physical Exam ?Vitals and nursing note reviewed.  ?Constitutional:   ?   General: He is not in acute distress. ?   Appearance: Normal appearance. He is well-developed.  ?HENT:  ?   Head: Normocephalic and atraumatic.  ?Eyes:  ?   Extraocular Movements: Extraocular movements intact.  ?   Conjunctiva/sclera: Conjunctivae normal.  ?   Pupils: Pupils are equal, round, and reactive to light.  ?Cardiovascular:  ?   Rate and Rhythm: Normal rate and regular rhythm.  ?   Heart sounds: No murmur heard. ?Pulmonary:  ?   Effort: Pulmonary effort is normal. No respiratory distress.  ?   Breath sounds:  Normal breath sounds.  ?   Comments: Right anterior Port-A-Cath. ?Abdominal:  ?   Palpations: Abdomen is soft.  ?   Tenderness: There is no abdominal tenderness.  ?Genitourinary: ?   Comments: Foley catheter in place

## 2021-08-13 NOTE — Assessment & Plan Note (Signed)
? ?#)   Depression: Cymbalta as outpatient.  However, in the setting of QTc prolongation or presenting EKG, will hold home Cymbalta for now.  ? ?Plan: Hold home Cymbalta, and repeat EKG in the morning, as further detailed above.  Add on serum magnesium level. ? ? ?

## 2021-08-13 NOTE — Assessment & Plan Note (Signed)
? ?#)   Type 2 Diabetes Mellitus: documented history of such. Home insulin regimen: Lantus 25 units SQ nightly as well as sliding scale Humalog 4 times per day. Home oral hypoglycemic agents: None. presenting blood sugar: 325. in terms of initial dose of basal insulin to be started during this hospitalization, will resume approximately half of outpatient dose in order to reduce risk for ensuing hypoglycemia ? ? ?Plan: accuchecks QAC and HS with low dose SSI.  Lantus 13 units SQ nightly. ? ? ?

## 2021-08-13 NOTE — H&P (Signed)
?History and Physical  ? ? ?PLEASE NOTE THAT [employer] SOFTWARE WAS USED IN THE CONSTRUCTION OF THIS NOTE. ? ? ?Aaron Weaver SHF:[id number] DOB: 1955-06-21 DOA: 08/13/2021 ? ?PCP: [name], MD  ?Patient coming from: home  ? ?I have personally briefly reviewed patient's old medical records in [place] ? ?Chief Complaint: fever ? ?HPI: Aaron WEATHERALL Sr. is a 66 y.o. male with medical history significant for B-cell lymphoma undergoing R-CHOP, COPD, depression, dementia, essential hypertension, type 2 diabetes mellitus, chronic hyponatremia with baseline serum sodium range 125-134, who is admitted to [facility] on 08/13/2021 with neutropenic fever after presenting from home to [facility] ED complaining of fever.  ? ?Patient with B-cell lymphoma undergoing R-CHOP therapy, with most recent cycle completed around August 06, 2021, representing fourth such cycle of R-CHOP therapy.  Presents to [facility] emergency complaining of elevated temperature over the course the last day, with temperature max reported to be 100.0.  Denies any associated chills, full body rigors, or generalized myalgias.  In the setting of urinary retention, he has had a indwelling Foley catheter over the course of the last 3 months, and is noted an increasingly hazy appearance to his urine over the course of the last few days.  Is an increase hematuria over that timeframe.  Not associated with any new onset abdominal pain, diarrhea, melena, or hematochezia.  Also denies any recent headache, neck stiffness, cough.  ? ?He notes recent history of erythema over the left elbow for which she was evaluated by Dr. Kem Boroughs Of orthopedic surgery, performed associated drainage with associated fluid culture demonstrating no growth.  For this potential cellulitis, the patient reports good compliance and completing the prescribed course of doxycycline, having finished this course on Wednesday, 08/10/2021.  Today, he notes mild residual erythema  associated with the left elbow, but reports that it represents progressive improvement over the degree and distribution of erythema that was present last week.  Otherwise he denies any recent rash.  No sore throat, oral lesions, rhinitis, rhinorrhea.  ? ?The patient reports that he experienced an episode of syncope earlier today noting loss of consciousness when rising from a seated to a standing position at home.  This was associated with preceding dizziness, lightheadedness, and subjective sensation of impending loss of consciousness.  Resulted in a fall to the floor, which patient reports that he did not strike his head.  This episode was witnessed by the patient's wife, who noted that the patient regained consciousness within a few seconds and that there was no associated tonic-clonic activity, nor any associated tongue biting, nor any associated loss of bowel/bladder function.  Denies any associated or ensuing chest pain, shortness of breath.  Denies any ensuing headache or neck pain.  He also denies any concerning specific acute arthralgias or myalgias.  The patient reports that he has experienced a similar single syncopal episode approximately 1 week after completion of each of his previous 3 cycles of R-CHOP.  On a daily baby aspirin at home, but otherwise not on any additional blood thinners as outpatient. ? ?Medical history notable for essential pretension, for which she is on atenolol at home. ? ?Regarding his B-cell lymphoma, he follows with Dr. Marin Olp as his outpatient heme-onc physician, and has routine outpatient follow-up with Dr. Marin Olp scheduled for this upcoming Monday, 08/15/2021.  Has a right-sided Port-A-Cath in place. ? ?Per chart review, the patient has a history of chronic hyponatremia dating back to at least January 2023, with  serum sodium range over that timeframe noted to be 1 25-1 34, with most recent prior serum sodium dated points noted to be 134 on 08/05/2021 as well as 132 on  07/29/2021.  Additionally, positive COVID-19 test noted to have occurred on 06/28/2021. ? ? ? ? ?ED Course:  ?Vital signs in the ED were notable for the following: Temperature max 99.2; initial heart rate 101, which decreased to 87 following interval IV fluid administration; blood pressure 109/71-134/88; respiratory rate 19-23, oxygen saturation 94 to 95% on room air. ? ?Labs were notable for the following: CMP notable for the following: Sodium 128 H to approximately 131 when taking into account concomitant hyperglycemia, BUN 28, creatinine 0.71, relative to most recent prior serum creatinine data point of 0.8 on 08/05/2021, BUN to creatinine ratio 39, glucose 325, calcium, corrected for mild hypoalbuminemia 8.9, albumin 3.4, otherwise, liver enzymes found to be within normal limits.  CBC notable for the following: Blood cell count 1.7 compared to 8.7 on 08/05/2021, with presenting absolute neutrophil count of 1200, hemoglobin 10.4 compared to 12.1 on 08/05/2021, platelet count 54 compared to 199 on 08/05/2021.  Urinalysis showed 11-20 white blood cells, many bacteria, no squamous epithelial cells, nitrate positive, large leukocyte esterase.  Blood cultures x2 as well as urine culture were collected prior to initiation of IV antibiotics. ? ?Imaging and additional notable ED work-up: EKG showed sinus rhythm with heart rate 98, prolonged Qtcof 533 ms, and no evidence of T wave or ST changes, including no evidence of ST elevation.  Chest x-ray showed no evidence of acute cardiopulmonary process.   ? ?EDP discussed patient's case with on-call heme-onc physician, Dr. Alvy Bimler, Who recommended admission to the hospitalist service for further evaluation and management of neutropenic fever, with specific recommendations for antibiotic coverage in the form of meropenem and IV vancomycin.  Dr. Alvy Bimler to formally consult, and will see the patient in the morning. ? ?While in the ED, the following were administered: Meropenem, IV  vancomycin, normal saline x1 L bolus followed by continuous NS at 100 cc/h. ? ?Subsequently, the patient was admitted for further evaluation and management of neutropenic fever with presentation notable for sepsis due to suspected urinary tract infection, with preliminary work-up also notable acute prerenal azotemia as well as prolonged Qtc in the setting of syncopal episode earlier in the day. ? ? ? ?Review of Systems: As per HPI otherwise 10 point review of systems negative.  ? ?Past Medical History:  ?Diagnosis Date  ? AAA (abdominal aortic aneurysm) ([place])   ? Allergic rhinitis   ? seasonal  ? Allergy   ? Anxiety state, unspecified   ? COPD (chronic obstructive pulmonary disease) ([place])   ? Corns and callosities   ? toe  ? Cramp of limb   ? legs  ? Depression   ? 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 Dysrhythmia   ? GERD (gastroesophageal reflux disease)   ? Impacted cerumen   ? Impotence of organic origin   ? Lumbago   ? Lung nodule   ? Other and unspecified hyperlipidemia   ? Routine general medical examination at a health care facility   ? Skipped heart beats   ? Tobacco use disorder   ? Type II or unspecified type diabetes mellitus without mention of complication, not stated as uncontrolled   ? type II  ? Unspecified essential hypertension   ? ? ?Past Surgical History:  ?Procedure Laterality Date  ? ABDOMINAL  AORTIC ENDOVASCULAR STENT GRAFT Bilateral 07/12/2020  ? Procedure: ABDOMINAL AORTIC ENDOVASCULAR STENT GRAFT;  Surgeon: [name], MD;  Location: [facility];  Service: Vascular;  Laterality: Bilateral;  ? AXILLARY LYMPH NODE BIOPSY Right 04/27/2021  ? Procedure: AXILLARY EXCISIONAL LYMPH NODE BIOPSY;  Surgeon: Dwan Bolt, MD;  Location: [facility];  Service: General;  Laterality: Right;  ? BACK SURGERY    ? fracture  ? CARPECTOMY Right 09/11/2019  ? Procedure: PROXIMAL ROW CARPECTOMY; RADIAL STYLOIDECTOMY; POSTERIOR INTEROSSIUS NERVE  RESECTION;  Surgeon: Daryll Brod, MD;  Location: [facility];  Service: Orthopedics;  Laterality: Right;  AXILLARY BLOCK  ? COLONOSCOPY    ? INGUINAL HERNIA REPAIR    ? right  ? IR FLUORO GUIDE CV LINE

## 2021-08-13 NOTE — Assessment & Plan Note (Signed)
? ?#)   Chronic hyponatremia: Serum sodium noted to be in the low since at least January 2023, with associated sodium range over that timeframe noted to be 1 25-1 34, with presenting serum sodium level consistent with this range.  This patient's hyponatremia is chronic in nature with presenting value consistent with baseline range, will refrain from extensive inpatient evaluation of his hyponatremia at this time. ? ?Plan: Monitor strict I's and O's and daily weights.  Providing gentle IV fluids in the setting of suspected mild dehydration as evidenced by acute prerenal azotemia.  Repeat CMP in the morning.  Add on TSH. ? ? ?

## 2021-08-14 DIAGNOSIS — D709 Neutropenia, unspecified: Secondary | ICD-10-CM | POA: Diagnosis not present

## 2021-08-14 DIAGNOSIS — R55 Syncope and collapse: Secondary | ICD-10-CM | POA: Diagnosis not present

## 2021-08-14 DIAGNOSIS — R5081 Fever presenting with conditions classified elsewhere: Secondary | ICD-10-CM | POA: Diagnosis not present

## 2021-08-14 DIAGNOSIS — E871 Hypo-osmolality and hyponatremia: Secondary | ICD-10-CM | POA: Diagnosis not present

## 2021-08-14 DIAGNOSIS — J449 Chronic obstructive pulmonary disease, unspecified: Secondary | ICD-10-CM | POA: Diagnosis not present

## 2021-08-14 LAB — COMPREHENSIVE METABOLIC PANEL
ALT: 19 U/L (ref 0–44)
AST: 10 U/L — ABNORMAL LOW (ref 15–41)
Albumin: 3 g/dL — ABNORMAL LOW (ref 3.5–5.0)
Alkaline Phosphatase: 64 U/L (ref 38–126)
Anion gap: 8 (ref 5–15)
BUN: 18 mg/dL (ref 8–23)
CO2: 26 mmol/L (ref 22–32)
Calcium: 8.2 mg/dL — ABNORMAL LOW (ref 8.9–10.3)
Chloride: 102 mmol/L (ref 98–111)
Creatinine, Ser: 0.56 mg/dL — ABNORMAL LOW (ref 0.61–1.24)
GFR, Estimated: >60 mL/min (ref >60)
Glucose, Bld: 244 mg/dL — ABNORMAL HIGH (ref 70–99)
Potassium: 4.1 mmol/L (ref 3.5–5.1)
Sodium: 136 mmol/L (ref 135–145)
Total Bilirubin: 0.9 mg/dL (ref 0.3–1.2)
Total Protein: 6 g/dL — ABNORMAL LOW (ref 6.5–8.1)

## 2021-08-14 LAB — CBC WITH DIFFERENTIAL/PLATELET
Abs Immature Granulocytes: 0.07 10*3/uL (ref 0.00–0.07)
Basophils Absolute: 0 10*3/uL (ref 0.0–0.1)
Basophils Relative: 1 %
Eosinophils Absolute: 0 10*3/uL (ref 0.0–0.5)
Eosinophils Relative: 3 %
HCT: 27.9 % — ABNORMAL LOW (ref 39.0–52.0)
Hemoglobin: 9.5 g/dL — ABNORMAL LOW (ref 13.0–17.0)
Immature Granulocytes: 6 %
Lymphocytes Relative: 38 %
Lymphs Abs: 0.5 10*3/uL — ABNORMAL LOW (ref 0.7–4.0)
MCH: 29.9 pg (ref 26.0–34.0)
MCHC: 34.1 g/dL (ref 30.0–36.0)
MCV: 87.7 fL (ref 80.0–100.0)
Monocytes Absolute: 0.1 10*3/uL (ref 0.1–1.0)
Monocytes Relative: 8 %
Neutro Abs: 0.5 10*3/uL — ABNORMAL LOW (ref 1.7–7.7)
Neutrophils Relative %: 44 %
Platelets: 46 10*3/uL — ABNORMAL LOW (ref 150–400)
RBC: 3.18 MIL/uL — ABNORMAL LOW (ref 4.22–5.81)
RDW: 15.9 % — ABNORMAL HIGH (ref 11.5–15.5)
WBC: 1.2 10*3/uL — CL (ref 4.0–10.5)
nRBC: 0 % (ref 0.0–0.2)

## 2021-08-14 LAB — GLUCOSE, CAPILLARY
Glucose-Capillary: 208 mg/dL — ABNORMAL HIGH (ref 70–99)
Glucose-Capillary: 291 mg/dL — ABNORMAL HIGH (ref 70–99)
Glucose-Capillary: 203 mg/dL — ABNORMAL HIGH (ref 70–99)
Glucose-Capillary: 350 mg/dL — ABNORMAL HIGH (ref 70–99)
Glucose-Capillary: 319 mg/dL — ABNORMAL HIGH (ref 70–99)

## 2021-08-14 LAB — MAGNESIUM: Magnesium: 2.2 mg/dL (ref 1.7–2.4)

## 2021-08-14 LAB — TSH: TSH: 0.786 u[IU]/mL (ref 0.350–4.500)

## 2021-08-14 MED ORDER — CHLORHEXIDINE GLUCONATE CLOTH 2 % EX PADS
6.0000 | MEDICATED_PAD | Freq: Every day | CUTANEOUS | Status: DC
  Administered 2021-08-15 – 2021-08-17 (×3): 6 via TOPICAL

## 2021-08-14 MED ORDER — MELATONIN 5 MG PO TABS
5.0000 mg | ORAL_TABLET | Freq: Every evening | ORAL | Status: DC | PRN
  Administered 2021-08-14 – 2021-08-16 (×3): 5 mg via ORAL
  Filled 2021-08-14 (×3): qty 1

## 2021-08-14 NOTE — Progress Notes (Addendum)
?PROGRESS NOTE ? ? ? ?Aaron GAUNT Sr.  QBH:[id number] DOB: [birth year] DOA: 08/13/2021 ?PCP: [name], MD  ? ?Brief Narrative:  ?66 y.o. male with medical history significant for B-cell lymphoma undergoing R-CHOP (most recent cycle completed around 08/06/2021), COPD, depression, dementia, essential hypertension, type 2 diabetes mellitus, chronic hyponatremia with baseline serum sodium range 125-134 presented with fever.  On presentation, Tmax was 99.2 with mild tachycardia and tachypnea.  Sodium was 128, WBCs of 1.7 with ANC of 1200 with platelets of 54.  UA suggestive of UTI.  Chest x-ray showed no acute infiltrates.  ED provider discussed with on-call oncologist-Dr. Alvy Bimler who recommended vancomycin and meropenem.  Patient was started on IV fluids and broad-spectrum antibiotics. ? ?Assessment & Plan: ?  ?Neutropenic fever ?Sepsis: Present on admission ?UTI: Present on admission ?-Presented with WBCs of 1.7 with ANC of 1200 along with tachypnea and evidence of UTI.  Currently on meropenem and vancomycin as per oncology recommendations.  Patient possibly has UTI. ?-Follow cultures.  Continue IV fluids ?-WBC 1.2 today. ? ?Pancytopenia ?B-cell lymphoma currently undergoing R-CHOP treatment ?-Most recent chemotherapy completed on around 08/06/2021.  He is currently pancytopenic with decreasing platelets.  No signs of bleeding.  We will follow oncology recommendations.  Repeat CBC in AM. ? ?Syncope ?-Possibly from sepsis.  Monitor.  PT eval. ? ?Hyponatremia ?-Patient has had issues with chronic hyponatremia with sodium ranging from 125-134.  Improving.  Sodium 136 today.  Continue IV fluids.  Repeat a.m. labs. ? ?Diabetes mellitus type 2 with hyperglycemia ?-Continue long-acting insulin along with CBGs with SSI ? ?BP ?-Continue Flomax ? ?Hyperlipidemia ?-Continue statin ? ?COPD ?-Continue current home regimen.  Currently stable. ? ?QTc prolongation ?-QTc 533 on presentation.  Cymbalta held.  Repeat a.m.  EKG ? ?GERD ?-Continue PPI ? ?Depression ?-Hold Cymbalta.  Outpatient follow-up with PCP. ? ?DVT prophylaxis: SCDs.  Avoid Lovenox/heparin because of thrombocytopenia ?Code Status: Full ?Family Communication: None at bedside ?Disposition Plan: ?Status is: Inpatient ?Remains inpatient appropriate because: Of severity of illness.  Need for IV fluids and antibiotics. ? ? ? ?Consultants: Oncology ? ?Procedures: None ? ?Antimicrobials: Meropenem and vancomycin from 08/13/2021 onwards.  Also on Famvir as an outpatient which is being continued ? ? ?Subjective: ?Patient seen and examined at bedside.  Denies worsening abdominal pain, nausea, vomiting.  Feels slightly better.  Denies any worsening cough. ? ?Objective: ?Vitals:  ? 08/13/21 2200 08/13/21 2303 08/14/21 0420 08/14/21 0973  ?BP: 108/64 120/82 (!) 108/56 127/88  ?Pulse: 89 87 97 90  ?Resp: '17 20 18 16  '$ ?Temp:  98.6 ?F (37 ?C) 98.3 ?F (36.8 ?C) 98 ?F (36.7 ?C)  ?TempSrc:  Oral Oral Oral  ?SpO2: 95% 96% 98% 97%  ?Weight:      ?Height:      ? ? ?Intake/Output Summary (Last 24 hours) at 08/14/2021 1046 ?Last data filed at 08/14/2021 0735 ?Gross per 24 hour  ?Intake 957.79 ml  ?Output 1100 ml  ?Net -142.21 ml  ? ?Filed Weights  ? 08/13/21 1833  ?Weight: 73 kg  ? ? ?Examination: ? ?General exam: Appears calm and comfortable.  Currently on room air.  Looks chronically ill and deconditioned. ?Respiratory system: Bilateral decreased breath sounds at bases with some scattered crackles ?Cardiovascular system: S1 & S2 heard, Rate controlled ?Gastrointestinal system: Abdomen is nondistended, soft and nontender. Normal bowel sounds heard. ?Extremities: No cyanosis, clubbing, edema  ?Central nervous system: Alert and oriented. No focal neurological deficits. Moving extremities ?Skin: No rashes, lesions or ulcers.  Mild left elbow erythema present which has much improved as per the patient. ?Psychiatry: Judgement and insight appear normal. Mood & affect appropriate.  ? ? ? ?Data  Reviewed: I have personally reviewed following labs and imaging studies ? ?CBC: ?Recent Labs  ?Lab 08/13/21 ?1852 08/14/21 ?4098  ?WBC 1.7* 1.2*  ?NEUTROABS 1.2* 0.5*  ?HGB 10.4* 9.5*  ?HCT 29.7* 27.9*  ?MCV 86.1 87.7  ?PLT 54* 46*  ? ?Basic Metabolic Panel: ?Recent Labs  ?Lab 08/13/21 ?1852 08/14/21 ?1191  ?NA 128* 136  ?K 4.2 4.1  ?CL 96* 102  ?CO2 23 26  ?GLUCOSE 325* 244*  ?BUN 28* 18  ?CREATININE 0.71 0.56*  ?CALCIUM 8.5* 8.2*  ?MG 1.9 2.2  ? ?GFR: ?Estimated Creatinine Clearance: 92.5 mL/min (A) (by C-G formula based on SCr of 0.56 mg/dL (L)). ?Liver Function Tests: ?Recent Labs  ?Lab 08/13/21 ?1852 08/14/21 ?4782  ?AST 14* 10*  ?ALT 24 19  ?ALKPHOS 76 64  ?BILITOT 1.0 0.9  ?PROT 6.6 6.0*  ?ALBUMIN 3.4* 3.0*  ? ?Recent Labs  ?Lab 08/13/21 ?9562  ?LIPASE 31  ? ?No results for input(s): AMMONIA in the last 168 hours. ?Coagulation Profile: ?No results for input(s): INR, PROTIME in the last 168 hours. ?Cardiac Enzymes: ?No results for input(s): CKTOTAL, CKMB, CKMBINDEX, TROPONINI in the last 168 hours. ?BNP (last 3 results) ?No results for input(s): PROBNP in the last 8760 hours. ?HbA1C: ?No results for input(s): HGBA1C in the last 72 hours. ?CBG: ?Recent Labs  ?Lab 08/13/21 ?2312 08/14/21 ?0723  ?GLUCAP 221* 208*  ? ?Lipid Profile: ?No results for input(s): CHOL, HDL, LDLCALC, TRIG, CHOLHDL, LDLDIRECT in the last 72 hours. ?Thyroid Function Tests: ?Recent Labs  ?  08/13/21 ?2327  ?TSH 0.786  ? ?Anemia Panel: ?No results for input(s): VITAMINB12, FOLATE, FERRITIN, TIBC, IRON, RETICCTPCT in the last 72 hours. ?Sepsis Labs: ?Recent Labs  ?Lab 08/13/21 ?2044  ?LATICACIDVEN 1.2  ? ? ?No results found for this or any previous visit (from the past 240 hour(s)).  ? ? ? ? ? ?Radiology Studies: ?DG Chest Port 1 View ? ?Result Date: 08/13/2021 ?CLINICAL DATA:  History of B-cell lymphoma with recent syncopal episode, initial encounter EXAM: PORTABLE CHEST 1 VIEW COMPARISON:  PET-CT from 07/26/2021 FINDINGS: Cardiac shadow is  within normal limits. Right chest wall port is seen in satisfactory position. Postsurgical changes in the right axilla are noted. The lungs are clear bilaterally. No acute bony abnormality is seen. Old healed rib fractures on the left are noted. IMPRESSION: No acute abnormality noted Electronically Signed   By: Inez Catalina M.D.   On: 08/13/2021 19:29   ? ? ? ? ? ?Scheduled Meds: ? arformoterol  15 mcg Nebulization BID  ? aspirin EC  81 mg Oral Daily  ? calcium carbonate  500 mg of elemental calcium Oral Q breakfast  ? famciclovir  250 mg Oral BID  ? insulin aspart  0-5 Units Subcutaneous QHS  ? insulin aspart  0-9 Units Subcutaneous TID WC  ? insulin glargine-yfgn  13 Units Subcutaneous QHS  ? pantoprazole  40 mg Oral Daily  ? potassium chloride SA  20 mEq Oral Daily  ? rosuvastatin  10 mg Oral Daily  ? tamsulosin  0.4 mg Oral QPM  ? ?Continuous Infusions: ? sodium chloride 100 mL/hr at 08/14/21 0829  ? meropenem (MERREM) IV 1 g (08/14/21 0539)  ? vancomycin    ? ? ? ? ? ? ? ? ?Aline August, MD ?[facility] ?08/14/2021, 10:46 AM  ? ? ?

## 2021-08-14 NOTE — Progress Notes (Signed)
Aaron ATIYEH Sr.   DOB:1956/01/22   CW#:[id number]   ? ?ASSESSMENT & PLAN:  ?Neutropenic fever ?The patient has developed neutropenic fever secondary to recent chemotherapy ?He is placed on broad-spectrum IV antibiotics ?Cultures are pending ?His urinalysis are grossly abnormal, source is likely related to urinary tract infection ?He has received G-CSF support on 08/08/2021 ?He does not need G-CSF support right now ? ?Diffuse large B-cell lymphoma ?His recent imaging study indicated positive response to treatment ?Continue supportive care ? ?Acquired pancytopenia ?Due to recent chemotherapy ?Monitor closely ?He does not need blood transfusion support ? ?Uncontrolled hyperglycemia ?We will follow blood sugar carefully while he is hospitalized ? ?Severe hyponatremia, dehydration ?When he came in yesterday, he had low sodium and felt clinically dehydrated ?With IV fluid support, this has improved ?Recommend reducing the dose of IV fluids since he is eating better ? ?Code Status ?Full code ? ?Goals of care ?Resolution of infection and pancytopenia ? ?Discharge planning ?I anticipate he will be here for the next 2 to 3 days ?Dr. Marin Olp will resume care tomorrow ? ?All questions were answered. The patient knows to call the clinic with any problems, questions or concerns. ?  ?The total time spent in the appointment was 55 minutes encounter with patients including review of chart and various tests results, discussions about plan of care and coordination of care plan ? ?Heath Lark, MD ?08/14/2021 12:32 PM ? ?Subjective:  ?The patient was admitted to the hospital through the emergency department after presentation with fever ?I have reviewed his chart carefully ?The patient have history of lymphoma status post chemotherapy with R-CHOP ?He had received chemotherapy a week ago and G-CSF on 08/08/2021 ?The patient have history of infection and hospitalization related to this ?He also have multiple comorbidities including COPD,  dementia, poorly controlled diabetes and others ?I recommend admission last night.  He was started on IV fluid support and broad-spectrum IV antibiotics.  Cultures are pending but urinalysis came back grossly abnormal.  The patient had Foley catheter in place long-term ?He denies mucositis, sore throat, cough or changes in bowel habits ? ?Objective:  ?Vitals:  ? 08/14/21 0722 08/14/21 1210  ?BP: 127/88 (!) 144/88  ?Pulse: 90 98  ?Resp: 16 18  ?Temp: 98 ?F (36.7 ?C) 100.2 ?F (37.9 ?C)  ?SpO2: 97% 98%  ?  ? ?Intake/Output Summary (Last 24 hours) at 08/14/2021 1232 ?Last data filed at 08/14/2021 0735 ?Gross per 24 hour  ?Intake 957.79 ml  ?Output 1100 ml  ?Net -142.21 ml  ? ? ?GENERAL:alert, no distress and comfortable.  He looks a bit pale ?SKIN: skin color, texture, turgor are normal, no rashes or significant lesions ?EYES: normal, Conjunctiva are pink and non-injected, sclera clear ?OROPHARYNX:no exudate, no erythema and lips, buccal mucosa, and tongue normal  ?NECK: supple, thyroid normal size, non-tender, without nodularity ?LYMPH:  no palpable lymphadenopathy in the cervical, axillary or inguinal ?LUNGS: clear to auscultation and percussion with normal breathing effort ?HEART: regular rate & rhythm and no murmurs and no lower extremity edema ?ABDOMEN:abdomen soft, non-tender and normal bowel sounds ?Musculoskeletal:no cyanosis of digits and no clubbing  ?NEURO: alert & oriented x 3 with fluent speech, no focal motor/sensory deficits ?  ?Labs:  ?Recent Labs  ?  06/28/21 ?1761 06/29/21 ?6073 08/05/21 ?0920 08/13/21 ?1852 08/14/21 ?7106  ?NA 130*   < > 134* 128* 136  ?K 4.8   < > 4.1 4.2 4.1  ?CL 97*   < > 101 96* 102  ?CO2  23   < > '25 23 26  ' ?GLUCOSE 371*   < > 307* 325* 244*  ?BUN 21   < > 25* 28* 18  ?CREATININE 1.05   < > 0.80 0.71 0.56*  ?CALCIUM 9.1   < > 9.3 8.5* 8.2*  ?GFRNONAA >60   < > >60 >60 >60  ?PROT 7.1   < > 6.2* 6.6 6.0*  ?ALBUMIN 3.4*   < > 3.9 3.4* 3.0*  ?AST 23   < > 20 14* 10*  ?ALT 29   < > '28 24  19  ' ?ALKPHOS 68   < > 78 76 64  ?BILITOT 1.3*   < > 0.7 1.0 0.9  ?BILIDIR 0.3*  --   --   --   --   ?IBILI 1.0*  --   --   --   --   ? < > = values in this interval not displayed.  ? ? ?Studies:  ?NM PET Image Restag (PS) Skull Base To Thigh ? ?Result Date: 07/27/2021 ?CLINICAL DATA:  Subsequent treatment strategy for diffuse large B-cell lymphoma. Chemotherapy complete port removal and drainage of right axillary abscess 06/13/2021. Bone marrow biopsy 06/08/2021 EXAM: NUCLEAR MEDICINE PET SKULL BASE TO THIGH TECHNIQUE: 8.67 mCi F-18 FDG was injected intravenously. Full-ring PET imaging was performed from the skull base to thigh after the radiotracer. CT data was obtained and used for attenuation correction and anatomic localization. Fasting blood glucose: 334 mg/dl COMPARISON:  Multiple priors including recent CT chest abdomen and pelvis June 28, 2021 and prior PET-CT April 15, 2021 FINDINGS: Mediastinal blood pool activity: SUV max 3.2 Liver activity: SUV max 3.63 NECK: Interval resolution of the hypermetabolic soft tissue density anterior to the right ear. No hypermetabolic cervical adenopathy. Incidental CT findings: No pathologically enlarged cervical lymph nodes. Calcifications of the carotid arteries. CHEST: Interval resolution of the hypermetabolic bilateral axillary lymph nodes. No hypermetabolic mediastinal or hilar lymph nodes. Mild hypermetabolism associated with some ill-defined right axillary soft tissue adjacent to surgical clips with a max SUV of 1.9 on image 72/4 likely reflecting the recently drained right axillary abscess. Incidental CT findings: Right chest wall port with tip at the superior cavoatrial junction. Coronary artery calcifications. Atherosclerotic calcifications of the aorta without aneurysmal dilation. Normal size heart. No significant pericardial effusion/thickening. No suspicious thyroid nodule or mass on this motion degraded examination. Paraseptal emphysema. ABDOMEN/PELVIS:  Similar appearance of the ill-defined soft tissue fullness in the left renal hilar fat for instance on image 138/4 which is again is probably hypermetabolic but difficult to discern from background renal collecting system activity. Hypermetabolic left common iliac soft tissue density now measures 14 mm on image 172/4 with a max SUV of 3.6 previously measuring 27 mm with a max SUV of 8.8. Decrease in size of the bilateral hypermetabolic obturator lymph nodes with the previously indexed right obturator lymph node now measuring 10 mm in short axis on image 88/4 with a max SUV of 2.8 previously 15 mm with a max SUV of 5. No new or enlarging hypermetabolic abdominal or pelvic lymph nodes. Incidental CT findings: 10 mm macroscopic fat containing left adrenal nodules consistent with a benign myelolipoma. Tiny subcentimeter hypodense hepatic lesion in the dome of the liver is too small to accurately characterize. No splenomegaly or focal splenic lesion. No bowel obstruction. Colonic diverticulosis without findings of acute diverticulitis. Urinary bladder is decompressed around a Foley catheter. Prior endovascular abdominal aortic aneurysm repair. SKELETON: Diffuse marrow activity most commonly reflects marrow  reconstitution. No discrete foci of abnormal hypermetabolic osseous activity to definitely indicate active skeletal lymphomatous involvement. Mild hypermetabolism associated with nondisplaced left 9th-12th rib fractures. Incidental CT findings: Multilevel degenerative changes spine. Remote left ninth and right eighth and tenth rib fractures. IMPRESSION: 1. Resolved hypermetabolic axillary lymph nodes with decreased size and/or metabolic activity associated with the pelvic lymph nodes and left renal hilar soft tissue density consistent with treatment response. No findings of new or progressive disease. ([place]) 3 2. Diffuse marrow activity most commonly reflects marrow reconstitution. No discrete foci of abnormal  hypermetabolic osseous activity to definitely indicate active skeletal lymphomatous involvement. Electronically Signed   By: Dahlia Bailiff M.D.   On: 07/27/2021 14:00  ? ?DG Chest Port 1 View ? ?Result Date: 4

## 2021-08-15 ENCOUNTER — Other Ambulatory Visit: Payer: Self-pay

## 2021-08-15 ENCOUNTER — Inpatient Hospital Stay: Payer: Medicare Other

## 2021-08-15 DIAGNOSIS — D709 Neutropenia, unspecified: Secondary | ICD-10-CM | POA: Diagnosis not present

## 2021-08-15 DIAGNOSIS — R5081 Fever presenting with conditions classified elsewhere: Secondary | ICD-10-CM | POA: Diagnosis not present

## 2021-08-15 LAB — CBC WITH DIFFERENTIAL/PLATELET
Abs Immature Granulocytes: 0.01 10*3/uL (ref 0.00–0.07)
Basophils Absolute: 0 10*3/uL (ref 0.0–0.1)
Basophils Relative: 1 %
Eosinophils Absolute: 0 10*3/uL (ref 0.0–0.5)
Eosinophils Relative: 3 %
HCT: 27 % — ABNORMAL LOW (ref 39.0–52.0)
Hemoglobin: 9.2 g/dL — ABNORMAL LOW (ref 13.0–17.0)
Immature Granulocytes: 1 %
Lymphocytes Relative: 47 %
Lymphs Abs: 0.4 10*3/uL — ABNORMAL LOW (ref 0.7–4.0)
MCH: 29.9 pg (ref 26.0–34.0)
MCHC: 34.1 g/dL (ref 30.0–36.0)
MCV: 87.7 fL (ref 80.0–100.0)
Monocytes Absolute: 0.1 10*3/uL (ref 0.1–1.0)
Monocytes Relative: 17 %
Neutro Abs: 0.2 10*3/uL — CL (ref 1.7–7.7)
Neutrophils Relative %: 31 %
Platelets: 44 10*3/uL — ABNORMAL LOW (ref 150–400)
RBC: 3.08 MIL/uL — ABNORMAL LOW (ref 4.22–5.81)
RDW: 15.5 % (ref 11.5–15.5)
WBC: 0.8 10*3/uL — CL (ref 4.0–10.5)
nRBC: 0 % (ref 0.0–0.2)

## 2021-08-15 LAB — COMPREHENSIVE METABOLIC PANEL
ALT: 25 U/L (ref 0–44)
AST: 17 U/L (ref 15–41)
Albumin: 3 g/dL — ABNORMAL LOW (ref 3.5–5.0)
Alkaline Phosphatase: 64 U/L (ref 38–126)
Anion gap: 7 (ref 5–15)
BUN: 11 mg/dL (ref 8–23)
CO2: 24 mmol/L (ref 22–32)
Calcium: 8.2 mg/dL — ABNORMAL LOW (ref 8.9–10.3)
Chloride: 105 mmol/L (ref 98–111)
Creatinine, Ser: 0.68 mg/dL (ref 0.61–1.24)
GFR, Estimated: >60 mL/min (ref >60)
Glucose, Bld: 278 mg/dL — ABNORMAL HIGH (ref 70–99)
Potassium: 4.3 mmol/L (ref 3.5–5.1)
Sodium: 136 mmol/L (ref 135–145)
Total Bilirubin: 1.1 mg/dL (ref 0.3–1.2)
Total Protein: 6 g/dL — ABNORMAL LOW (ref 6.5–8.1)

## 2021-08-15 LAB — SURGICAL PCR SCREEN
MRSA, PCR: POSITIVE — AB
Staphylococcus aureus: POSITIVE — AB

## 2021-08-15 LAB — MAGNESIUM: Magnesium: 2 mg/dL (ref 1.7–2.4)

## 2021-08-15 LAB — GLUCOSE, CAPILLARY
Glucose-Capillary: 262 mg/dL — ABNORMAL HIGH (ref 70–99)
Glucose-Capillary: 321 mg/dL — ABNORMAL HIGH (ref 70–99)
Glucose-Capillary: 265 mg/dL — ABNORMAL HIGH (ref 70–99)
Glucose-Capillary: 319 mg/dL — ABNORMAL HIGH (ref 70–99)

## 2021-08-15 MED ORDER — INSULIN GLARGINE-YFGN 100 UNIT/ML ~~LOC~~ SOLN
25.0000 [IU] | Freq: Every day | SUBCUTANEOUS | Status: DC
Start: 2021-08-15 — End: 2021-08-16
  Administered 2021-08-15: 25 [IU] via SUBCUTANEOUS
  Filled 2021-08-15: qty 0.25

## 2021-08-15 MED ORDER — MUPIROCIN 2 % EX OINT
1.0000 "application " | TOPICAL_OINTMENT | Freq: Two times a day (BID) | CUTANEOUS | Status: DC
  Administered 2021-08-15 – 2021-08-17 (×4): 1 via NASAL
  Filled 2021-08-15: qty 22

## 2021-08-15 NOTE — Progress Notes (Signed)
Central monitoring notified RN that pt had briefly converted into a. Fib. By the time I received that call and checked the monitor, pt was back in Cool. Went to pts room and he stated he had just gotten up to the BR, was back in bed and HR was around 120 ST rhythm. He denied any symptoms other than the trip to the bathroom tiring him out. On call provider ordered EKG, which showed ST with PVCs. I did notice that his atenolol had been on hold due to syncopal event PTA. Pt stated that he had a. Fib once before in the hospital that resolved on its own. Continue to monitor. Hortencia Conradi RN  ?

## 2021-08-15 NOTE — Evaluation (Signed)
Physical Therapy Evaluation ?Patient Details ?Name: Aaron Weaver. ?MRN: [id number] ?DOB: [birth year] ?Today's Date: 08/15/2021 ? ?History of Present Illness ? 66 y.o. male with medical history significant for B-cell lymphoma undergoing R-CHOP, COPD, depression, dementia, essential hypertension, type 2 diabetes mellitus, chronic hyponatremia, rib fxs 8-10.  ?Clinical Impression ? Pt admitted with above diagnosis.  ?Pt amb ~ 30' with HR max 126. Had episode of afib earlier this am. Pt doing well however fatigues rapidly. Discussed energy conservation as well as activity progression at home. Do not feel pt needs formal f/u PT, pt in agreement. His wife is independent but also undergoing radiation. ? Pt currently with functional limitations due to the deficits listed below (see PT Problem List). Pt will benefit from skilled PT to increase their independence and safety with mobility to allow discharge to the venue listed below.  ,   ?   ? ?Recommendations for follow up therapy are one component of a multi-disciplinary discharge planning process, led by the attending physician.  Recommendations may be updated based on patient status, additional functional criteria and insurance authorization. ? ?Follow Up Recommendations No PT follow up ? ?  ?Assistance Recommended at Discharge PRN  ?Patient can return home with the following ? Help with stairs or ramp for entrance;Assistance with cooking/housework ? ?  ?Equipment Recommendations None recommended by PT  ?Recommendations for Other Services ?    ?  ?Functional Status Assessment Patient has had a recent decline in their functional status and demonstrates the ability to make significant improvements in function in a reasonable and predictable amount of time.  ? ?  ?Precautions / Restrictions Precautions ?Precautions: None ?Restrictions ?Weight Bearing Restrictions: No  ? ?  ? ?Mobility ? Bed Mobility ?Overal bed mobility: Needs Assistance ?Bed Mobility: Supine to Sit, Sit  to Supine ?  ?  ?Supine to sit: Modified independent (Device/Increase time) ?Sit to supine: Modified independent (Device/Increase time) ?  ?  ?  ? ?Transfers ?Overall transfer level: Needs assistance ?Equipment used: None ?Transfers: Sit to/from Stand ?Sit to Stand: Supervision ?  ?  ?  ?  ?  ?General transfer comment: for safety/lines  no physical assist ?  ? ?Ambulation/Gait ?Ambulation/Gait assistance: Supervision ?Gait Distance (Feet): 80 Feet ?Assistive device: IV Pole ?Gait Pattern/deviations: Step-through pattern ?  ?  ?  ?General Gait Details: fatigues rapidly. HR 109-126 max during ambulation, 2-3/4 DOE ? ?Stairs ?  ?  ?  ?  ?  ? ?Wheelchair Mobility ?  ? ?Modified Rankin (Stroke Patients Only) ?  ? ?  ? ?Balance Overall balance assessment: Mild deficits observed, not formally tested, History of Falls (falls d/t syncope) ?  ?  ?  ?  ?  ?  ?  ?  ?  ?  ?  ?  ?  ?  ?  ?  ?  ?  ?   ? ? ? ?Pertinent Vitals/Pain Pain Assessment ?Pain Assessment: No/denies pain  ? ? ?Home Living Family/patient expects to be discharged to:: Private residence ?  ?Available Help at Discharge: Family;Available 24 hours/day ?Type of Home: House ?Home Access: Ramped entrance ?  ?  ?  ?Home Layout: One level ?Home Equipment: Conservation officer, nature (2 wheels) ?Additional Comments: lives with wife, she doing XRT for sarcoma  ?  ?Prior Function Prior Level of Function : Independent/Modified Independent;Driving;History of Falls (last six months) ?  ?  ?  ?  ?  ?  ?Mobility Comments: RW occasionally as needed, falls due to  syncope ?ADLs Comments: indep, drives ?  ? ? ?Hand Dominance  ?   ? ?  ?Extremity/Trunk Assessment  ? Upper Extremity Assessment ?Upper Extremity Assessment: Overall WFL for tasks assessed ?  ? ?Lower Extremity Assessment ?Lower Extremity Assessment: Overall WFL for tasks assessed ?  ? ?   ?Communication  ? Communication: No difficulties  ?Cognition Arousal/Alertness: Awake/alert ?Behavior During Therapy: [facility] for tasks  assessed/performed ?Overall Cognitive Status: Within Functional Limits for tasks assessed ?  ?  ?  ?  ?  ?  ?  ?  ?  ?  ?  ?  ?  ?  ?  ?  ?  ?  ?  ? ?  ?General Comments   ? ?  ?Exercises    ? ?Assessment/Plan  ?  ?PT Assessment Patient needs continued PT services  ?PT Problem List Decreased knowledge of use of DME;Cardiopulmonary status limiting activity;Decreased activity tolerance ? ?   ?  ?PT Treatment Interventions DME instruction;Therapeutic exercise;Gait training;Functional mobility training;Therapeutic activities;Patient/family education;Balance training   ? ?PT Goals (Current goals can be found in the Care Plan section)  ?Acute Rehab PT Goals ?Patient Stated Goal: home soon, feel better ?PT Goal Formulation: With patient ?Time For Goal Achievement: 08/29/21 ?Potential to Achieve Goals: Good ? ?  ?Frequency Min 3X/week ?  ? ? ?Co-evaluation   ?  ?  ?  ?  ? ? ?  ?AM-PAC PT "6 Clicks" Mobility  ?Outcome Measure Help needed turning from your back to your side while in a flat bed without using bedrails?: None ?Help needed moving from lying on your back to sitting on the side of a flat bed without using bedrails?: None ?Help needed moving to and from a bed to a chair (including a wheelchair)?: None ?Help needed standing up from a chair using your arms (e.g., wheelchair or bedside chair)?: A Little ?Help needed to walk in hospital room?: A Little ?Help needed climbing 3-5 steps with a railing? : A Little ?6 Click Score: 21 ? ?  ?End of Session Equipment Utilized During Treatment: Gait belt ?Activity Tolerance: Patient tolerated treatment well ?Patient left: in bed;with call bell/phone within reach;with bed alarm set ?Nurse Communication: Mobility status ?PT Visit Diagnosis: Other abnormalities of gait and mobility (R26.89) ?  ? ?Time: 9326-7124 ?PT Time Calculation (min) (ACUTE ONLY): 15 min ? ? ?Charges:   PT Evaluation ?$PT Eval Low Complexity: 1 Low ?  ?  ?   ? ? ?Baxter Flattery, PT ? ?Acute Rehab Dept [facility])  [phone] ?Pager [phone] ? ?08/15/2021 ? ? ?Laytoya Ion ?08/15/2021, 10:33 AM ? ?

## 2021-08-15 NOTE — Progress Notes (Signed)
?PROGRESS NOTE ? ? ? ?Aaron DOWDELL Sr.  DQQ:[id number] DOB: 10-Aug-1955 DOA: 08/13/2021 ?PCP: [name], MD  ? ?Brief Narrative:  ?66 y.o. male with medical history significant for B-cell lymphoma undergoing R-CHOP (most recent cycle completed around 08/06/2021), COPD, depression, dementia, essential hypertension, type 2 diabetes mellitus, chronic hyponatremia with baseline serum sodium range 125-134 presented with fever.  On presentation, Tmax was 99.2 with mild tachycardia and tachypnea.  Sodium was 128, WBCs of 1.7 with ANC of 1200 with platelets of 54.  UA suggestive of UTI.  Chest x-ray showed no acute infiltrates.  ED provider discussed with on-call oncologist-Dr. Alvy Bimler who recommended vancomycin and meropenem.  Patient was started on IV fluids and broad-spectrum antibiotics. ? ?Assessment & Plan: ?  ?Neutropenic fever ?Sepsis: Present on admission ?UTI: Present on admission ?-Currently on meropenem and vancomycin.   ?-Blood cultures negative so far.  Urine culture growing more than 100,000 colonies per mL of gram-negative rods.  Follow identification and sensitivities.  DC IV fluids. ?-WBC 0.8 today ? ?Pancytopenia ?B-cell lymphoma currently undergoing R-CHOP treatment ?-Most recent chemotherapy completed on around 08/06/2021.  He is currently pancytopenic with decreasing platelets.  No signs of bleeding.  We will follow oncology recommendations.  Repeat CBC in AM. ? ?Syncope ?-Possibly from sepsis.  Monitor.  Tolerated PT evaluated ?-No further syncope since hospitalization. ? ?Hyponatremia ?-Patient has had issues with chronic hyponatremia with sodium ranging from 125-134.  Improving.  Sodium 136 today.  Encourage oral intake.  DC IV fluids.  Repeat a.m. labs. ? ?Diabetes mellitus type 2 with hyperglycemia ?-Continue long-acting insulin along with CBGs with SSI ? ?BP ?-Continue Flomax ? ?Hyperlipidemia ?-Continue statin ? ?COPD ?-Continue current home regimen.  Currently stable. ? ?QTc prolongation ?-QTc  533 on presentation.  Cymbalta held.  QTc has much improved. ? ?GERD ?-Continue PPI ? ?Depression ?-Resume Cymbalta on discharge.  Outpatient follow-up with PCP. ? ?DVT prophylaxis: SCDs.  Avoid Lovenox/heparin because of thrombocytopenia ?Code Status: Full ?Family Communication: None at bedside ?Disposition Plan: ?Status is: Inpatient ?Remains inpatient appropriate because: Of severity of illness.  Need for IV antibiotics.  Possible discharge in 1 to 2 days when clinically improves and cleared by oncology. ? ? ? ?Consultants: Oncology ? ?Procedures: None ? ?Antimicrobials: Meropenem and vancomycin from 08/13/2021 onwards.  Also on Famvir as an outpatient which is being continued ? ? ?Subjective: ?Patient seen and examined at bedside.  He is better.  Oral intake is improving.  Denies worsening abdomen pain, nausea, vomiting or diarrhea. ?Objective: ?Vitals:  ? 08/14/21 2022 08/15/21 0435 08/15/21 0803 08/15/21 0953  ?BP:  103/70  133/78  ?Pulse:  (!) 110  (!) 107  ?Resp:  16  16  ?Temp:  98.2 ?F (36.8 ?C)  98 ?F (36.7 ?C)  ?TempSrc:  Oral  Oral  ?SpO2: 98% 97% 98% 97%  ?Weight:      ?Height:      ? ? ?Intake/Output Summary (Last 24 hours) at 08/15/2021 1050 ?Last data filed at 08/15/2021 [phone] ?Gross per 24 hour  ?Intake 3076.35 ml  ?Output 3200 ml  ?Net -123.65 ml  ? ? ?Filed Weights  ? 08/13/21 1833  ?Weight: 73 kg  ? ? ?Examination: ? ?General: On room air.  No distress ?ENT/neck: No thyromegaly.  JVD is not elevated  ?respiratory: Decreased breath sounds at bases bilaterally with some crackles; no wheezing  ?CVS: S1-S2 heard, intermittently tachycardic  ?abdominal: Soft, nontender, slightly distended; no organomegaly, normal bowel sounds are heard ?Extremities: Trace lower  extremity edema; no cyanosis  ?CNS: Awake and alert.  No focal neurologic deficit.  Moves extremities ?Lymph: No obvious lymphadenopathy ?Skin: No obvious ecchymosis/lesions  ?psych: Affect, judgment and mood are normal  ?musculoskeletal: No  obvious joint swelling/deformity  ? ? ? ?Data Reviewed: I have personally reviewed following labs and imaging studies ? ?CBC: ?Recent Labs  ?Lab 08/13/21 ?1852 08/14/21 ?5993 08/15/21 ?0522  ?WBC 1.7* 1.2* 0.8*  ?NEUTROABS 1.2* 0.5* 0.2*  ?HGB 10.4* 9.5* 9.2*  ?HCT 29.7* 27.9* 27.0*  ?MCV 86.1 87.7 87.7  ?PLT 54* 46* 44*  ? ? ?Basic Metabolic Panel: ?Recent Labs  ?Lab 08/13/21 ?1852 08/14/21 ?5701 08/15/21 ?0522  ?NA 128* 136 136  ?K 4.2 4.1 4.3  ?CL 96* 102 105  ?CO2 '23 26 24  '$ ?GLUCOSE 325* 244* 278*  ?BUN 28* 18 11  ?CREATININE 0.71 0.56* 0.68  ?CALCIUM 8.5* 8.2* 8.2*  ?MG 1.9 2.2 2.0  ? ? ?GFR: ?Estimated Creatinine Clearance: 92.5 mL/min (by C-G formula based on SCr of 0.68 mg/dL). ?Liver Function Tests: ?Recent Labs  ?Lab 08/13/21 ?1852 08/14/21 ?7793 08/15/21 ?0522  ?AST 14* 10* 17  ?ALT '24 19 25  '$ ?ALKPHOS 76 64 64  ?BILITOT 1.0 0.9 1.1  ?PROT 6.6 6.0* 6.0*  ?ALBUMIN 3.4* 3.0* 3.0*  ? ? ?Recent Labs  ?Lab 08/13/21 ?9030  ?LIPASE 31  ? ? ?No results for input(s): AMMONIA in the last 168 hours. ?Coagulation Profile: ?No results for input(s): INR, PROTIME in the last 168 hours. ?Cardiac Enzymes: ?No results for input(s): CKTOTAL, CKMB, CKMBINDEX, TROPONINI in the last 168 hours. ?BNP (last 3 results) ?No results for input(s): PROBNP in the last 8760 hours. ?HbA1C: ?No results for input(s): HGBA1C in the last 72 hours. ?CBG: ?Recent Labs  ?Lab 08/14/21 ?1213 08/14/21 ?1738 08/14/21 ?1937 08/14/21 ?2112 08/15/21 ?0740  ?GLUCAP 291* 203* 350* 319* 262*  ? ? ?Lipid Profile: ?No results for input(s): CHOL, HDL, LDLCALC, TRIG, CHOLHDL, LDLDIRECT in the last 72 hours. ?Thyroid Function Tests: ?Recent Labs  ?  08/13/21 ?2327  ?TSH 0.786  ? ? ?Anemia Panel: ?No results for input(s): VITAMINB12, FOLATE, FERRITIN, TIBC, IRON, RETICCTPCT in the last 72 hours. ?Sepsis Labs: ?Recent Labs  ?Lab 08/13/21 ?2044  ?LATICACIDVEN 1.2  ? ? ? ?Recent Results (from the past 240 hour(s))  ?Urine Culture     Status: Abnormal (Preliminary  result)  ? Collection Time: 08/13/21  8:32 PM  ? Specimen: Urine, Clean Catch  ?Result Value Ref Range Status  ? Specimen Description   Final  ?  URINE, CLEAN CATCH ?Performed at [facility], [place] [address], [place], [place] [address] ?  ? Special Requests   Final  ?  NONE ?Performed at [facility], [place] [address], [place], [place] [address] ?  ? Culture (A)  Final  ?  >=100,000 COLONIES/mL GRAM NEGATIVE RODS ?SUSCEPTIBILITIES TO FOLLOW ?Performed at [facility], [place] [address], [place], [place] [address] ?  ? Report Status PENDING  Incomplete  ?  ? ? ? ? ? ?Radiology Studies: ?DG Chest Port 1 View ? ?Result Date: 08/13/2021 ?CLINICAL DATA:  History of B-cell lymphoma with recent syncopal episode, initial encounter EXAM: PORTABLE CHEST 1 VIEW COMPARISON:  PET-CT from 07/26/2021 FINDINGS: Cardiac shadow is within normal limits. Right chest wall port is seen in satisfactory position. Postsurgical changes in the right axilla are noted. The lungs are clear bilaterally. No acute bony abnormality is seen. Old healed rib fractures on the left  are noted. IMPRESSION: No acute abnormality noted Electronically Signed   By: Inez Catalina M.D.   On: 08/13/2021 19:29   ? ? ? ? ? ?Scheduled Meds: ? arformoterol  15 mcg Nebulization BID  ? aspirin EC  81 mg Oral Daily  ? calcium carbonate  500 mg of elemental calcium Oral Q breakfast  ? Chlorhexidine Gluconate Cloth  6 each Topical Daily  ? famciclovir  250 mg Oral BID  ? insulin aspart  0-5 Units Subcutaneous QHS  ? insulin aspart  0-9 Units Subcutaneous TID WC  ? insulin glargine-yfgn  13 Units Subcutaneous QHS  ? pantoprazole  40 mg Oral Daily  ? potassium chloride SA  20 mEq Oral Daily  ? rosuvastatin  10 mg Oral Daily  ? tamsulosin  0.4 mg Oral QPM  ? ?Continuous Infusions: ? sodium chloride 50 mL/hr at 08/15/21 0648  ? meropenem (MERREM) IV Stopped (08/15/21 0547)  ? vancomycin Stopped (08/15/21 0006)  ? ? ? ? ? ? ? ? ?Aline August, MD ?[facility] ?08/15/2021, 10:50 AM  ? ? ?

## 2021-08-15 NOTE — Progress Notes (Signed)
Unfortunately, Aaron Weaver is in the back in the hospital again.  This seems to be the pattern for him after chemotherapy.  I am just surprised that he now has a urinary tract infection with gram-negative rods.  We will have to see what the culture shows. ? ?He is on vancomycin and Merrem. ? ?He feels okay.  He has had no nausea or vomiting.  Has had no diarrhea.  There is been no mouth sores.  He has had no rashes.  There is been no bleeding.  He has had no cough or shortness of breath. ? ?As expected, his blood sugar is on the high side to 78.  His creatinine is 0.68.  His calcium is 8.2 with an albumin of 3.0.  His white cell count is 0.8.  His ANC is 200.  His monocytes are on the higher side.  His hemoglobin is 9.2 and platelet count 44,000. ? ?He is eating okay. ? ?Does not complain of any dysuria or hematuria. ? ?His vital signs are temperature 98.4.  Pulse 106.  Blood pressure 136/79.  Oral exam shows no mucositis.  Lungs are clear.  Cardiac exam regular rate and rhythm.  I do not hear a murmur.  Abdomen is soft.  He has decent bowel sounds.  There is no guarding or rebound tenderness.  Extremity shows no clubbing, cyanosis or edema.  Neurological exam is not focal.  Skin exam shows no rashes. ? ?He is neutropenic.  He is growing gram-negative rods in his blood.  We may have to give him a dose of Neupogen to get his blood count up a little bit more quickly. ? ?He is hemodynamically stable. ? ?I know he will get great care up at 6 E.  We had to really see what the regular negative rods are and see what the sensitivity is. ? ?Lattie Haw, MD ? ?Romans 15:13 ?

## 2021-08-15 NOTE — Progress Notes (Signed)
Inpatient Diabetes Program Recommendations ? ?AACE/ADA: New Consensus Statement on Inpatient Glycemic Control (2015) ? ?Target Ranges:  Prepandial:   less than 140 mg/dL ?     Peak postprandial:   less than 180 mg/dL (1-2 hours) ?     Critically ill patients:  140 - 180 mg/dL  ? ?Lab Results  ?Component Value Date  ? GLUCAP 321 (H) 08/15/2021  ? HGBA1C 9.1 (H) 05/13/2021  ? ? ?Review of Glycemic Control ? ?Diabetes history: DM2 ?Outpatient Diabetes medications: Lantus 25 QD, Humalog 2-15 units QID ?Current orders for Inpatient glycemic control: Semglee 25 units QHS, Novolog 0-9 units TID with meals and 0-5 HS ? ?HgbA1C - 9.1% ?Has Libre. ?Post-prandials elevated ? ?Inpatient Diabetes Program Recommendations:   ? ?Add Novolog 3 units TID with meals if eating > 50%. ? ?Continue to follow. ? ?Thank you. ?Lorenda Peck, RD, LDN, CDE ?Inpatient Diabetes Coordinator ?[phone]  ? ? ? ? ?

## 2021-08-16 DIAGNOSIS — D61818 Other pancytopenia: Secondary | ICD-10-CM

## 2021-08-16 DIAGNOSIS — R5081 Fever presenting with conditions classified elsewhere: Secondary | ICD-10-CM | POA: Diagnosis not present

## 2021-08-16 DIAGNOSIS — Z978 Presence of other specified devices: Secondary | ICD-10-CM

## 2021-08-16 DIAGNOSIS — D709 Neutropenia, unspecified: Secondary | ICD-10-CM | POA: Diagnosis not present

## 2021-08-16 LAB — BASIC METABOLIC PANEL
Anion gap: 7 (ref 5–15)
BUN: 10 mg/dL (ref 8–23)
CO2: 24 mmol/L (ref 22–32)
Calcium: 8 mg/dL — ABNORMAL LOW (ref 8.9–10.3)
Chloride: 103 mmol/L (ref 98–111)
Creatinine, Ser: 0.58 mg/dL — ABNORMAL LOW (ref 0.61–1.24)
GFR, Estimated: >60 mL/min (ref >60)
Glucose, Bld: 360 mg/dL — ABNORMAL HIGH (ref 70–99)
Potassium: 3.7 mmol/L (ref 3.5–5.1)
Sodium: 134 mmol/L — ABNORMAL LOW (ref 135–145)

## 2021-08-16 LAB — GLUCOSE, CAPILLARY
Glucose-Capillary: 275 mg/dL — ABNORMAL HIGH (ref 70–99)
Glucose-Capillary: 308 mg/dL — ABNORMAL HIGH (ref 70–99)
Glucose-Capillary: 229 mg/dL — ABNORMAL HIGH (ref 70–99)
Glucose-Capillary: 266 mg/dL — ABNORMAL HIGH (ref 70–99)

## 2021-08-16 LAB — CBC WITH DIFFERENTIAL/PLATELET
Abs Immature Granulocytes: 0.01 10*3/uL (ref 0.00–0.07)
Basophils Absolute: 0 10*3/uL (ref 0.0–0.1)
Basophils Relative: 1 %
Eosinophils Absolute: 0 10*3/uL (ref 0.0–0.5)
Eosinophils Relative: 1 %
HCT: 27.3 % — ABNORMAL LOW (ref 39.0–52.0)
Hemoglobin: 9.5 g/dL — ABNORMAL LOW (ref 13.0–17.0)
Immature Granulocytes: 1 %
Lymphocytes Relative: 23 %
Lymphs Abs: 0.3 10*3/uL — ABNORMAL LOW (ref 0.7–4.0)
MCH: 30 pg (ref 26.0–34.0)
MCHC: 34.8 g/dL (ref 30.0–36.0)
MCV: 86.1 fL (ref 80.0–100.0)
Monocytes Absolute: 0.2 10*3/uL (ref 0.1–1.0)
Monocytes Relative: 11 %
Neutro Abs: 0.9 10*3/uL — ABNORMAL LOW (ref 1.7–7.7)
Neutrophils Relative %: 63 %
Platelets: 56 10*3/uL — ABNORMAL LOW (ref 150–400)
RBC: 3.17 MIL/uL — ABNORMAL LOW (ref 4.22–5.81)
RDW: 15.8 % — ABNORMAL HIGH (ref 11.5–15.5)
WBC: 1.4 10*3/uL — CL (ref 4.0–10.5)
nRBC: 0 % (ref 0.0–0.2)

## 2021-08-16 LAB — URINE CULTURE: Culture: >=100000 — AB

## 2021-08-16 LAB — MAGNESIUM: Magnesium: 2 mg/dL (ref 1.7–2.4)

## 2021-08-16 MED ORDER — ATENOLOL 50 MG PO TABS
100.0000 mg | ORAL_TABLET | Freq: Every day | ORAL | Status: DC
  Administered 2021-08-16 – 2021-08-17 (×2): 100 mg via ORAL
  Filled 2021-08-16 (×2): qty 2

## 2021-08-16 MED ORDER — FILGRASTIM 480 MCG/1.6ML IJ SOLN
480.0000 ug | Freq: Every day | INTRAMUSCULAR | Status: DC
  Filled 2021-08-16: qty 1.6

## 2021-08-16 MED ORDER — TBO-FILGRASTIM 480 MCG/0.8ML ~~LOC~~ SOSY
480.0000 ug | PREFILLED_SYRINGE | Freq: Every day | SUBCUTANEOUS | Status: DC
  Administered 2021-08-16: 480 ug via SUBCUTANEOUS
  Filled 2021-08-16: qty 0.8

## 2021-08-16 MED ORDER — SODIUM CHLORIDE 0.9 % IV SOLN
2.0000 g | INTRAVENOUS | Status: DC
  Administered 2021-08-16: 2 g via INTRAVENOUS
  Filled 2021-08-16 (×2): qty 20

## 2021-08-16 MED ORDER — INSULIN ASPART 100 UNIT/ML IJ SOLN
0.0000 [IU] | Freq: Three times a day (TID) | INTRAMUSCULAR | Status: DC
  Administered 2021-08-16: 11 [IU] via SUBCUTANEOUS
  Administered 2021-08-16 – 2021-08-17 (×2): 5 [IU] via SUBCUTANEOUS

## 2021-08-16 MED ORDER — INSULIN GLARGINE-YFGN 100 UNIT/ML ~~LOC~~ SOLN
30.0000 [IU] | Freq: Every day | SUBCUTANEOUS | Status: DC
  Administered 2021-08-16: 30 [IU] via SUBCUTANEOUS
  Filled 2021-08-16 (×2): qty 0.3

## 2021-08-16 NOTE — Progress Notes (Signed)
?   08/15/21 2135  ?Assess: MEWS Score  ?Temp 99.8 ?F (37.7 ?C)  ?BP (!) 154/83  ?Pulse Rate (!) 124  ?Resp 18  ?Level of Consciousness Alert  ?SpO2 99 %  ?O2 Device Room Air  ?Patient Activity (if Appropriate) In bed  ?Assess: MEWS Score  ?MEWS Temp 0  ?MEWS Systolic 0  ?MEWS Pulse 2  ?MEWS RR 0  ?MEWS LOC 0  ?MEWS Score 2  ?MEWS Score Color Yellow  ?Assess: if the MEWS score is Yellow or Red  ?Were vital signs taken at a resting state? Yes  ?Focused Assessment Change from prior assessment (see assessment flowsheet)  ?Does the patient meet 2 or more of the SIRS criteria? Yes  ?Does the patient have a confirmed or suspected source of infection? No  ?MEWS guidelines implemented *See Row Information* Yes  ?Treat  ?MEWS Interventions Escalated (See documentation below)  ?Pain Scale 0-10  ?Pain Score 0  ?Take Vital Signs  ?Increase Vital Sign Frequency  Yellow: Q 2hr X 2 then Q 4hr X 2, if remains yellow, continue Q 4hrs  ?Escalate  ?MEWS: Escalate Yellow: discuss with charge nurse/RN and consider discussing with provider and RRT  ?Notify: Charge Nurse/RN  ?Name of Charge Nurse/RN Notified Colletta Maryland, RN  ?Date Charge Nurse/RN Notified 08/15/21  ?Time Charge Nurse/RN Notified 2140  ?Notify: Provider  ?Provider Name/Title J. Olena Heckle, NP  ?Date Provider Notified 08/15/21  ?Time Provider Notified 2300  ?Notification Type Page  ?Notification Reason Change in status  ?Provider response Evaluate remotely  ?Date of Provider Response 08/15/21  ?Time of Provider Response 2330  ?Document  ?Patient Outcome Other (Comment) ?(Will continue to moitor)  ?Progress note created (see row info) Yes  ?Assess: SIRS CRITERIA  ?SIRS Temperature  0  ?SIRS Pulse 1  ?SIRS Respirations  0  ?SIRS WBC 0  ?SIRS Score Sum  1  ? ? ?

## 2021-08-16 NOTE — Progress Notes (Signed)
?PROGRESS NOTE ? ? ? ?SIERRA BISSONETTE Sr.  YKZ:[id number] DOB: 02/14/1956 DOA: 08/13/2021 ?PCP: [name], MD  ? ?Brief Narrative:  ?66 y.o. male with medical history significant for B-cell lymphoma undergoing R-CHOP (most recent cycle completed around 08/06/2021), COPD, depression, dementia, essential hypertension, type 2 diabetes mellitus, chronic hyponatremia with baseline serum sodium range 125-134 presented with fever.  On presentation, Tmax was 99.2 with mild tachycardia and tachypnea.  Sodium was 128, WBCs of 1.7 with ANC of 1200 with platelets of 54.  UA suggestive of UTI.  Chest x-ray showed no acute infiltrates.  ED provider discussed with on-call oncologist-Dr. Alvy Bimler who recommended vancomycin and meropenem.  Patient was started on IV fluids and broad-spectrum antibiotics. ? ?Assessment & Plan: ?  ?Neutropenic fever ?Sepsis: Present on admission ?UTI: Present on admission ?-Blood cultures negative so far.  Urine culture growing more than 100,000 colonies per mL of gram-negative rods.  Follow identification and sensitivities.  Off IV fluids. ?-WBC pending for today ?-Currently on meropenem.  Vancomycin discontinued on 08/15/2021. ?-Tmax of 100.3 over the last 24 hours. ? ?Pancytopenia ?B-cell lymphoma currently undergoing R-CHOP treatment ?-Most recent chemotherapy completed on around 08/06/2021.  He is currently pancytopenic with decreasing platelets.  No signs of bleeding.  Oncology following.  Labs pending for today.  Monitor. ? ?Paroxysmal A-fib with RVR ?-Went into A-fib with RVR with heart rate going up to 150s.  Still intermittently tachycardic.  We will start metoprolol 25 mg twice a day.  Cardiology evaluated ? ?Chronic urinary retention with history of indwelling Foley catheter ?-Oncology recommending urology evaluation for possible catheter exchange.  Spoke to Dr. Manny/urology on phone who recommended that the catheter can be changed by the nurse and if there are any issues, urology can be  contacted.  Will have the nurse change the catheter. ? ?Syncope ?-Possibly from sepsis.  Monitor.  Tolerated PT evaluated ?-No further syncope since hospitalization. ? ?Hyponatremia ?-Patient has had issues with chronic hyponatremia with sodium ranging from 125-134.  Improving.  Sodium pending today.  Encourage oral intake.  Off IV fluids.  Repeat a.m. labs. ? ?Diabetes mellitus type 2 with hyperglycemia ?-Continue long-acting insulin along with CBGs with SSI ? ?BP ?-Continue Flomax ? ?Hyperlipidemia ?-Continue statin ? ?COPD ?-Continue current home regimen.  Currently stable. ? ?QTc prolongation ?-QTc 533 on presentation.  Cymbalta held.  QTc has much improved. ? ?GERD ?-Continue PPI ? ?Depression ?-Resume Cymbalta on discharge.  Outpatient follow-up with PCP. ? ?DVT prophylaxis: SCDs.  Avoid Lovenox/heparin because of thrombocytopenia ?Code Status: Full ?Family Communication: None at bedside ?Disposition Plan: ?Status is: Inpatient ?Remains inpatient appropriate because: Of severity of illness.  Need for IV antibiotics.   ? ?Consultants: Oncology ? ?Procedures: None ? ?Antimicrobials:  ?antibiotics ? ? ?Subjective: ?Patient seen and examined at bedside.  Denies worsening shortness of breath, palpitations, nausea, vomiting.   ?Objective: ?Vitals:  ? 08/15/21 2135 08/15/21 2327 08/16/21 0147 08/16/21 0445  ?BP: (!) 154/83 117/81 108/70 (!) 128/56  ?Pulse: (!) 124 86 (!) 121 62  ?Resp: '18 18 18 18  '$ ?Temp: 99.8 ?F (37.7 ?C) 100.1 ?F (37.8 ?C) 98.6 ?F (37 ?C) (!) 97.5 ?F (36.4 ?C)  ?TempSrc: Oral Oral Oral Oral  ?SpO2: 99% 95% 97% 99%  ?Weight:      ?Height:      ? ? ?Intake/Output Summary (Last 24 hours) at 08/16/2021 0816 ?Last data filed at 08/15/2021 1747 ?Gross per 24 hour  ?Intake 983.47 ml  ?Output 1700 ml  ?Net -716.53  ml  ? ? ?Filed Weights  ? 08/13/21 1833  ?Weight: 73 kg  ? ? ?Examination: ? ?General: No acute distress.  Currently on room air. ?ENT/neck: No elevated JVD.  No palpable thyromegaly.    ?Respiratory: Bilateral decreased breath sounds at bases with some scattered crackles CVS: Tachycardic; S1-S2 heard  ?abdominal: Soft, nontender, distended mildly; no organomegaly; bowel sounds are heard extremities: No clubbing; mild lower extremity edema present ?CNS: Alert and oriented.  No focal neurologic deficit.  Moving extremities  ?lymph: No obvious palpable lymphadenopathy  ?skin: No obvious petechiae/rashes ?psych: Judgment, mood and affect are normal  ?musculoskeletal: No obvious joint swelling/deformity  ?Genitourinary: Indwelling Foley catheter present ? ? ? ?Data Reviewed: I have personally reviewed following labs and imaging studies ? ?CBC: ?Recent Labs  ?Lab 08/13/21 ?1852 08/14/21 ?7654 08/15/21 ?0522  ?WBC 1.7* 1.2* 0.8*  ?NEUTROABS 1.2* 0.5* 0.2*  ?HGB 10.4* 9.5* 9.2*  ?HCT 29.7* 27.9* 27.0*  ?MCV 86.1 87.7 87.7  ?PLT 54* 46* 44*  ? ? ?Basic Metabolic Panel: ?Recent Labs  ?Lab 08/13/21 ?1852 08/14/21 ?6503 08/15/21 ?0522  ?NA 128* 136 136  ?K 4.2 4.1 4.3  ?CL 96* 102 105  ?CO2 '23 26 24  '$ ?GLUCOSE 325* 244* 278*  ?BUN 28* 18 11  ?CREATININE 0.71 0.56* 0.68  ?CALCIUM 8.5* 8.2* 8.2*  ?MG 1.9 2.2 2.0  ? ? ?GFR: ?Estimated Creatinine Clearance: 92.5 mL/min (by C-G formula based on SCr of 0.68 mg/dL). ?Liver Function Tests: ?Recent Labs  ?Lab 08/13/21 ?1852 08/14/21 ?5465 08/15/21 ?0522  ?AST 14* 10* 17  ?ALT '24 19 25  '$ ?ALKPHOS 76 64 64  ?BILITOT 1.0 0.9 1.1  ?PROT 6.6 6.0* 6.0*  ?ALBUMIN 3.4* 3.0* 3.0*  ? ? ?Recent Labs  ?Lab 08/13/21 ?6812  ?LIPASE 31  ? ? ?No results for input(s): AMMONIA in the last 168 hours. ?Coagulation Profile: ?No results for input(s): INR, PROTIME in the last 168 hours. ?Cardiac Enzymes: ?No results for input(s): CKTOTAL, CKMB, CKMBINDEX, TROPONINI in the last 168 hours. ?BNP (last 3 results) ?No results for input(s): PROBNP in the last 8760 hours. ?HbA1C: ?No results for input(s): HGBA1C in the last 72 hours. ?CBG: ?Recent Labs  ?Lab 08/15/21 ?0740 08/15/21 ?1204  08/15/21 ?1713 08/15/21 ?2136 08/16/21 ?0745  ?GLUCAP 262* 321* 265* 319* 275*  ? ? ?Lipid Profile: ?No results for input(s): CHOL, HDL, LDLCALC, TRIG, CHOLHDL, LDLDIRECT in the last 72 hours. ?Thyroid Function Tests: ?Recent Labs  ?  08/13/21 ?2327  ?TSH 0.786  ? ? ?Anemia Panel: ?No results for input(s): VITAMINB12, FOLATE, FERRITIN, TIBC, IRON, RETICCTPCT in the last 72 hours. ?Sepsis Labs: ?Recent Labs  ?Lab 08/13/21 ?2044  ?LATICACIDVEN 1.2  ? ? ? ?Recent Results (from the past 240 hour(s))  ?Urine Culture     Status: Abnormal (Preliminary result)  ? Collection Time: 08/13/21  8:32 PM  ? Specimen: Urine, Clean Catch  ?Result Value Ref Range Status  ? Specimen Description   Final  ?  URINE, CLEAN CATCH ?Performed at [facility], [place] [address], [place], [place] [address] ?  ? Special Requests   Final  ?  NONE ?Performed at [facility], [place] [address], [place], [place] [address] ?  ? Culture (A)  Final  ?  >=100,000 COLONIES/mL GRAM NEGATIVE RODS ?SUSCEPTIBILITIES TO FOLLOW ?Performed at [facility], [place] [address], [place], [place] [address] ?  ? Report Status PENDING  Incomplete  ?Culture, blood (Routine X 2) w Reflex to  ID Panel     Status: None (Preliminary result)  ? Collection Time: 08/13/21  8:47 PM  ? Specimen: BLOOD  ?Result Value Ref Range Status  ? Specimen Description   Final  ?  BLOOD BLOOD RIGHT HAND ?Performed at [facility], [place] [address], [place], [place] [address] ?  ? Special Requests   Final  ?  BOTTLES DRAWN AEROBIC AND ANAEROBIC Blood Culture results may not be optimal due to an excessive volume of blood received in culture bottles ?Performed at [facility], [place] [address], [place], [place] [address] ?  ? Culture   Final  ?  NO GROWTH 1 DAY ?Performed at [facility], [place] [address], [place], [place] [address] ?  ? Report Status PENDING  Incomplete  ?Surgical pcr screen     Status: Abnormal  ?  Collection Time: 08/15/21  2:28 PM  ? Specimen: Nasal Mucosa; Nasal Swab  ?Result Value Ref Range Status  ? MRSA, PCR POSITIVE (A) NEGATIVE Final  ?  Comment: RESULT CALLED TO, READ BACK BY AND VERIFIED WITH: ?ARCILLA,R. RN AT [phone]

## 2021-08-16 NOTE — Progress Notes (Signed)
He is now in atrial fibrillation.  I am sure that the neutropenia probably has some to do with this.  Had a temperature last night.  His white cell count is dropping.  We will go ahead and give him some Neupogen.  He has a gram-negative rod in his urine. ? ?I think the real problem is that he has an indwelling Foley catheter.  He is supposed to see urology today.  They really need to see him in the hospital.  I do not know if the catheter needs to be exchanged. ? ?He is on broad-spectrum antibiotic coverage. ? ?There are no labs back yet today. ? ?He has had no bleeding.  He has had no cough or shortness of breath.  He has had no nausea or vomiting. ? ?His vital signs show temperature of 97.5.  Pulse 68.  Blood pressure 128/56.  His lungs are clear bilaterally.  Oral exam shows no mucositis.  Cardiac exam is regular rate and rhythm consistent with atrial fibrillation.  Abdomen is soft.  Extremity shows no clubbing, cyanosis or edema. ? ?We really need to see what his labs are.  Again, he has this gram-negative rod in his urine.  He has his indwelling Foley catheter.  I am sure this does not help in the situation.  Urology needs to see him and see about having the catheter exchanged. ? ?We really cannot let him go until his white cell count starts coming up and we see what the identification is of the gram-negative rod. ? ?I will put him on Neupogen.  Hopefully his white cell count will come up quickly. ? ?I do appreciate the incredible care he is getting from the wonderful staff up on [place]. ? ?Lattie Haw, MD ? ?Psalms 28:7 ?

## 2021-08-16 NOTE — Consult Note (Addendum)
?Cardiology Consultation:  ? ?Patient ID: Aaron Fee Sr. ?MRN: [id number]; DOB: 11-19-1955 ? ?Admit date: 08/13/2021 ?Date of Consult: 08/16/2021 ? ?PCP:  [name], MD ?  ?[facility] ?Cardiologist:  Candee Furbish, MD     ? ? ?Patient Profile:  ? ?Aaron Weaver. is a 66 y.o. male with a hx of B-cell lymphoma undergoing R-CHOP, COPD, abdominal aortic aneurysm s/p stent graft repair (2022), depression, dementia, essential hypertension, type 2 diabetes mellitus, chronic hyponatremia who is being seen 08/16/2021 for the evaluation of atrial fibrillation with RVR at the request of Dr. Starla Link. ? ?History of Present Illness:  ? ?Aaron Weaver is a 65 year old male with above medical history who is followed by Dr. Marlou Porch.  Per chart review, patient was first seen by cardiology in March 2022 for evaluation of dyspnea on exertion, PVCs on EKG.  Patient was started on atenolol 100 mg daily.  He was overall doing well until January 2023.  Patient was admitted from 1/22 - 2/1 with MRSA bacteremia.  Patient had an axillary abscess which grew out MRSA.  Port-A-Cath tip also grew MRSA and was removed.  Hospital course was complicated by AKI, COVID, SIADH.  Also noted to have brief runs of atrial fibrillation with rapid ventricular rate.  He was started on amiodarone and converted to normal sinus rhythm.  However the amiodarone was ultimately discontinued and he was started back on his atenolol home dose.  Echocardiogram on 05/31/2021 showed LVEF 55%, normal LV function, normal RV systolic function, no significant valvular disease.  TEE performed to rule out vegetation, study suggested dilated aortic root at 46 mm.  However, chest CTA from admission was reviewed with radiology and aortic root was felt to be 35 mm on that study. ? ?Patient was last seen by cardiology on 06/14/2021.  At that appointment, patient was maintaining sinus rhythm.  Patient was not started on anticoagulation at this time as atrial fibrillation  occurred in the setting of bacteremia.  Arranged a 14-day monitor that has not been read.  ? ?Patient presented to the emergency department on 4/8 following a fall at home.  Patient was dizzy and fainted, usually has the same episode 10 days after chemo.  Patient was admitted to the hospitalist service for treatment of neutropenic fever in the context of B-cell lymphoma, syncope, chronic hyponatremia.  Around 1 AM on 4/11, patient was noted to be in A-fib with RVR with heart rate into the 50s. No EKG captured.  Patient temperature at the time was 100.1 ?F.  Cardiology consulted ? ?Pertinent labs include: Na 136, K 4.3, creatinine 0.68, magnesium 2.0, WBC 0.8, hemoglobin 9.2, platelets 44.  ?EKG on 4/10 showed sinus tachycardia, with PACs, HR  ?Telemetry shows predominantly sinus tachycardia with PVCs, requested EKG. Not convincing for atrial fibrillation  ? ?On interview, patient denies any cardiac history besides the episode of atrial fibrillation while hospitalized in January 2023. Reports that he has had PVCs and PACs since 2022. On atenolol at home, but this medication had been held for the past few days while he has been admitted. Overnight, patient got up to use the bathroom and was told his heart rate was fast. Did admit that going to the bathroom was somewhat of a struggle due to deconditioning. Denies any palpitations, chest pain, sob. Denies any ankle edema.  ? ?Past Medical History:  ?Diagnosis Date  ? AAA (abdominal aortic aneurysm) ([place])   ? Allergic rhinitis   ? seasonal  ? Allergy   ?  Anxiety state, unspecified   ? COPD (chronic obstructive pulmonary disease) ([place])   ? Corns and callosities   ? toe  ? Cramp of limb   ? legs  ? Depression   ? 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 Dysrhythmia   ? GERD (gastroesophageal reflux disease)   ? Impacted cerumen   ? Impotence of organic origin   ? Lumbago   ? Lung nodule   ? Other and unspecified hyperlipidemia   ?  Routine general medical examination at a health care facility   ? Skipped heart beats   ? Tobacco use disorder   ? Type II or unspecified type diabetes mellitus without mention of complication, not stated as uncontrolled   ? type II  ? Unspecified essential hypertension   ? ? ?Past Surgical History:  ?Procedure Laterality Date  ? ABDOMINAL AORTIC ENDOVASCULAR STENT GRAFT Bilateral 07/12/2020  ? Procedure: ABDOMINAL AORTIC ENDOVASCULAR STENT GRAFT;  Surgeon: [name], MD;  Location: [facility];  Service: Vascular;  Laterality: Bilateral;  ? AXILLARY LYMPH NODE BIOPSY Right 04/27/2021  ? Procedure: AXILLARY EXCISIONAL LYMPH NODE BIOPSY;  Surgeon: Dwan Bolt, MD;  Location: [facility];  Service: General;  Laterality: Right;  ? BACK SURGERY    ? fracture  ? CARPECTOMY Right 09/11/2019  ? Procedure: PROXIMAL ROW CARPECTOMY; RADIAL STYLOIDECTOMY; POSTERIOR INTEROSSIUS NERVE RESECTION;  Surgeon: Daryll Brod, MD;  Location: [facility];  Service: Orthopedics;  Laterality: Right;  AXILLARY BLOCK  ? COLONOSCOPY    ? INGUINAL HERNIA REPAIR    ? right  ? IR FLUORO GUIDE CV LINE RIGHT  06/16/2021  ? IR IMAGING GUIDED PORT INSERTION  07/07/2021  ? IR US GUIDE VASC ACCESS LEFT  06/16/2021  ? POLYPECTOMY    ? PORT-A-CATH REMOVAL N/A 06/01/2021  ? Procedure: REMOVAL PORT-A-CATH;  Surgeon: Armandina Gemma, MD;  Location: [facility];  Service: General;  Laterality: N/A;  ? PORTACATH PLACEMENT Right 05/13/2021  ? Procedure: INSERTION PORT-A-CATH;  Surgeon: Dwan Bolt, MD;  Location: [facility];  Service: General;  Laterality: Right;  ? TEE WITHOUT CARDIOVERSION N/A 06/06/2021  ? Procedure: TRANSESOPHAGEAL ECHOCARDIOGRAM (TEE);  Surgeon: Lelon Perla, MD;  Location: [facility];  Service: Cardiovascular;  Laterality: N/A;  ? TONSILLECTOMY    ?  ? ?Home Medications:  ?Prior to Admission medications   ?Medication Sig Start Date End Date Taking? Authorizing Provider  ?acetaminophen (TYLENOL) 325 MG tablet  Take 650 mg by mouth every 6 (six) hours as needed for mild pain or headache.   Yes [provider]  ?albuterol (PROAIR HFA) 108 (90 Base) MCG/ACT inhaler Inhale 1-2 puffs into the lungs every 6 (six) hours as needed for wheezing or shortness of breath. 10/15/20  Yes Collene Gobble, MD  ?alendronate (FOSAMAX) 70 MG tablet Take 70 mg by mouth every Sunday. 02/12/20  Yes [provider]  ?aspirin EC 81 MG EC tablet Take 1 tablet (81 mg total) by mouth daily at 6 (six) AM. Swallow whole. ?Patient taking differently: Take 81 mg by mouth in the morning. Swallow whole. 07/14/20  Yes Ulyses Amor, PA-C  ?atenolol (TENORMIN) 100 MG tablet Take 100 mg by mouth daily. 09/02/20  Yes [provider]  ?calcium carbonate (OSCAL) 1500 (600 Ca) MG TABS tablet Take 600 mg of elemental calcium by mouth daily with breakfast.   Yes [provider]  ?diclofenac Sodium (VOLTAREN) 1 % GEL Apply 2  g topically daily as needed (to painful sites).   Yes [provider]  ?DULoxetine (CYMBALTA) 60 MG capsule Take 60 mg by mouth at bedtime. 04/01/18  Yes [provider]  ?famciclovir (FAMVIR) 250 MG tablet Take 1 tablet (250 mg total) by mouth 2 (two) times daily. 06/09/21  Yes [name], MD  ?Fish Oil OIL Take 1,500 mg by mouth daily.   Yes [provider]  ?insulin glargine (LANTUS) 100 UNIT/ML Solostar Pen Inject 25 Units into the skin daily. 06/30/21  Yes Hosie Poisson, MD  ?insulin lispro (HUMALOG) 100 UNIT/ML KwikPen Inject 2-15 Units into the skin 4 (four) times daily - after meals and at bedtime. Glucose 121 - 150: 2 units, Glucose 151 - 200: 3 units, Glucose 201 - 250: 5 units, Glucose 251 - 300: 8 units, Glucose 301 - 350: 11 units, Glucose 351 - 400: 15 units, Glucose > 400 call MD 06/08/21  Yes Dwyane Dee, MD  ?omeprazole (PRILOSEC) 40 MG capsule Take 40 mg by mouth every evening.   Yes [provider]  ?ondansetron (ZOFRAN) 8 MG tablet Take 1 tablet (8 mg  total) by mouth 2 (two) times daily as needed for refractory nausea / vomiting. Start on day 3 after cyclophosphamide chemotherapy. ?Patient taking differently: Take 8 mg by mouth 2 (two) times daily as ne

## 2021-08-16 NOTE — Progress Notes (Signed)
0005 Received a call from CCMD that pt was on Afib with RVR and that his HR increased to 150s. Pt was a sleep at that time. Pt temp was 100.1 F and tylenol was given. On call provider notified who came and evaluated the pt and stated the pt was on ST. ?

## 2021-08-17 LAB — BASIC METABOLIC PANEL
Anion gap: 8 (ref 5–15)
BUN: 13 mg/dL (ref 8–23)
CO2: 24 mmol/L (ref 22–32)
Calcium: 8.2 mg/dL — ABNORMAL LOW (ref 8.9–10.3)
Chloride: 103 mmol/L (ref 98–111)
Creatinine, Ser: 0.65 mg/dL (ref 0.61–1.24)
GFR, Estimated: >60 mL/min (ref >60)
Glucose, Bld: 278 mg/dL — ABNORMAL HIGH (ref 70–99)
Potassium: 4.1 mmol/L (ref 3.5–5.1)
Sodium: 135 mmol/L (ref 135–145)

## 2021-08-17 LAB — CBC WITH DIFFERENTIAL/PLATELET
Abs Immature Granulocytes: 0.13 10*3/uL — ABNORMAL HIGH (ref 0.00–0.07)
Basophils Absolute: 0.1 10*3/uL (ref 0.0–0.1)
Basophils Relative: 1 %
Eosinophils Absolute: 0 10*3/uL (ref 0.0–0.5)
Eosinophils Relative: 0 %
HCT: 28.2 % — ABNORMAL LOW (ref 39.0–52.0)
Hemoglobin: 9.6 g/dL — ABNORMAL LOW (ref 13.0–17.0)
Immature Granulocytes: 3 %
Lymphocytes Relative: 10 %
Lymphs Abs: 0.5 10*3/uL — ABNORMAL LOW (ref 0.7–4.0)
MCH: 30 pg (ref 26.0–34.0)
MCHC: 34 g/dL (ref 30.0–36.0)
MCV: 88.1 fL (ref 80.0–100.0)
Monocytes Absolute: 0.4 10*3/uL (ref 0.1–1.0)
Monocytes Relative: 7 %
Neutro Abs: 3.7 10*3/uL (ref 1.7–7.7)
Neutrophils Relative %: 79 %
Platelets: 74 10*3/uL — ABNORMAL LOW (ref 150–400)
RBC: 3.2 MIL/uL — ABNORMAL LOW (ref 4.22–5.81)
RDW: 16.5 % — ABNORMAL HIGH (ref 11.5–15.5)
WBC: 4.8 10*3/uL (ref 4.0–10.5)
nRBC: 0.4 % — ABNORMAL HIGH (ref 0.0–0.2)

## 2021-08-17 LAB — MAGNESIUM: Magnesium: 1.8 mg/dL (ref 1.7–2.4)

## 2021-08-17 LAB — GLUCOSE, CAPILLARY: Glucose-Capillary: 220 mg/dL — ABNORMAL HIGH (ref 70–99)

## 2021-08-17 MED ORDER — CEFDINIR 300 MG PO CAPS: 300.0000 mg | ORAL_CAPSULE | Freq: Two times a day (BID) | ORAL | 0 refills | Status: AC

## 2021-08-17 NOTE — Discharge Summary (Signed)
Physician Discharge Summary  ?Aaron BRAR Sr. YNW:[id number] DOB: 08/16/1955 DOA: 08/13/2021 ? ?PCP: [name], MD ? ?Admit date: 08/13/2021 ?Discharge date: 08/17/2021 ? ?Admitted From: Home ?Disposition: Home ? ?Recommendations for Outpatient Follow-up:  ?Follow up with PCP in 1-2 weeks ?Please obtain BMP/CBC in one week your next doctors visit.  ?Omnicef for 7 days has been prescribed ? ?Home Health: ?Equipment/Devices: ?Discharge Condition: Stable ?CODE STATUS:  ?Diet recommendation:  ? ?Brief/Interim Summary: ?66 y.o. male with medical history significant for B-cell lymphoma undergoing R-CHOP (most recent cycle completed around 08/06/2021), COPD, depression, dementia, essential hypertension, type 2 diabetes mellitus, chronic hyponatremia with baseline serum sodium range 125-134 presented with fever.  On presentation, Tmax was 99.2 with mild tachycardia and tachypnea.  Sodium was 128, WBCs of 1.7 with ANC of 1200 with platelets of 54.  UA suggestive of UTI.  Chest x-ray showed no acute infiltrates.  ED provider discussed with on-call oncologist-Dr. Alvy Bimler who recommended vancomycin and meropenem.  Patient was started on IV fluids and broad-spectrum antibiotics. ? ? ?Assessment and Plan: ? ?Neutropenic fever ?Sepsis: Present on admission ?UTI: Present on admission ?-Urine cultures growing Citrobacter.  Foley catheter changed this admission.  It is pansensitive therefore will transition to oral antibiotics as mentioned above.  He will follow-up outpatient with urology. ?  ?Pancytopenia ?B-cell lymphoma currently undergoing R-CHOP treatment ?-Most recent chemotherapy completed on around 08/06/2021.  He is currently pancytopenic with decreasing platelets.  Seen by oncology, this appears to have improved. ?  ?Paroxysmal A-fib with RVR ?-Seen by cardiology.  Rate is improved.  Anticoagulation on hold due to pancytopenia, this can be readdressed in the future. ?  ?Chronic urinary retention with history of indwelling  Foley catheter ?-Follows with urology.  Foley catheter changed in the hospital. ?  ?Syncope ?-Unclear etiology.  Seen by physical therapy ?  ?Hyponatremia ?-Resolved with fluids ?  ?Diabetes mellitus type 2 with hyperglycemia ?-Resume home regimen ?  ?BP ?-Continue Flomax ? ?Hyperlipidemia ?-Continue statin ? ?COPD ?-Continue current home regimen.  Currently stable. ?  ?QTc prolongation ?-Resume home regimen ?  ?GERD ?-Continue PPI ?  ?Depression ?-Resume Cymbalta on discharge.  Outpatient follow-up with PCP. ? ? ?  ?Body mass index is 21.83 kg/m?. ? ?  ? ? ? ?Discharge Diagnoses:  ?Principal Problem: ?  Neutropenic fever ([place]) ?Active Problems: ?  DM2 (diabetes mellitus, type 2) ([place]) ?  Hyperlipidemia ?  Depression ?  COPD (chronic obstructive pulmonary disease) ([place]) ?  Pancytopenia ([place]) ?  Paroxysmal A-fib ([place]) ?  Syncope and collapse ?  Hyponatremia ?  Sepsis due to gram-negative UTI ([place]) ?  Prolonged QT interval ?  GERD (gastroesophageal reflux disease) ?  Chronic indwelling Foley catheter ? ? ? ? ? ?Consultations: ?Cardiology ?Oncology ? ?Subjective: ?Feels well no complaints.  Wishes to go home. ? ?Discharge Exam: ?Vitals:  ? 08/17/21 0738 08/17/21 0842  ?BP:  120/67  ?Pulse:  (!) 108  ?Resp:  18  ?Temp:    ?SpO2: 96% 96%  ? ?Vitals:  ? 08/17/21 0154 08/17/21 0602 08/17/21 6578 08/17/21 0842  ?BP: 116/78 109/74  120/67  ?Pulse: 97 (!) 104  (!) 108  ?Resp: _0 ?Temp: 98 ?F (36.7 ?C) 98.1 ?F (36.7 ?C)    ?TempSrc: Oral Oral    ?SpO2: 99% 95% 96% 96%  ?Weight:      ?Height:      ? ? ?General: Pt is alert, awake, not in acute distress ?Cardiovascular: RRR, S1/S2 +,  no rubs, no gallops ?Respiratory: CTA bilaterally, no wheezing, no rhonchi ?Abdominal: Soft, NT, ND, bowel sounds + ?Extremities: no edema, no cyanosis ? ?Discharge Instructions ? ? ?Allergies as of 08/17/2021   ? ?   Reactions  ? Fexofenadine Other (See Comments)  ? "dries me out too much"  ? Hydrochlorothiazide Other (See Comments)  ?  Cramps  ? Penicillins Hives  ? Pravastatin Sodium Other (See Comments)  ? Aches  ? Requip [ropinirole Hcl] Other (See Comments)  ? Bad dreams  ? ?  ? ?  ?Medication List  ?  ? ?STOP taking these medications   ? ?doxycycline 100 MG tablet ?Commonly known as: VIBRA-TABS ?  ?nitrofurantoin (macrocrystal-monohydrate) 100 MG capsule ?Commonly known as: MACROBID ?  ? ?  ? ?TAKE these medications   ? ?acetaminophen 325 MG tablet ?Commonly known as: TYLENOL ?Take 650 mg by mouth every 6 (six) hours as needed for mild pain or headache. ?  ?albuterol 108 (90 Base) MCG/ACT inhaler ?Commonly known as: ProAir HFA ?Inhale 1-2 puffs into the lungs every 6 (six) hours as needed for wheezing or shortness of breath. ?  ?alendronate 70 MG tablet ?Commonly known as: FOSAMAX ?Take 70 mg by mouth every Sunday. ?  ?allopurinol 100 MG tablet ?Commonly known as: Zyloprim ?Take 1 tablet (100 mg total) by mouth daily. Start 3 days BEFORE chemotherapy starts ?  ?aspirin 81 MG EC tablet ?Take 1 tablet (81 mg total) by mouth daily at 6 (six) AM. Swallow whole. ?What changed: when to take this ?  ?atenolol 100 MG tablet ?Commonly known as: TENORMIN ?Take 100 mg by mouth daily. ?  ?blood glucose meter kit and supplies Kit ?Dispense based on patient and insurance preference. Use up to four times daily as directed. ?  ?calcium carbonate 1500 (600 Ca) MG Tabs tablet ?Commonly known as: OSCAL ?Take 600 mg of elemental calcium by mouth daily with breakfast. ?  ?cefdinir 300 MG capsule ?Commonly known as: OMNICEF ?Take 1 capsule (300 mg total) by mouth 2 (two) times daily for 7 days. ?  ?diclofenac Sodium 1 % Gel ?Commonly known as: VOLTAREN ?Apply 2 g topically daily as needed (to painful sites). ?  ?DULoxetine 60 MG capsule ?Commonly known as: CYMBALTA ?Take 60 mg by mouth at bedtime. ?  ?famciclovir 250 MG tablet ?Commonly known as: FAMVIR ?Take 1 tablet (250 mg total) by mouth 2 (two) times daily. ?  ?Fish Oil Oil ?Take 1,500 mg by mouth daily. ?   ?FreeStyle Libre 14 Day Sensor Misc ?Inject 1 Device into the skin every 14 (fourteen) days. ?  ?FreeStyle Libre 2 Reader Kerrin Mo ?  ?insulin glargine 100 UNIT/ML Solostar Pen ?Commonly known as: LANTUS ?Inject 25 Units into the skin daily. ?  ?insulin lispro 100 UNIT/ML KwikPen ?Commonly known as: HUMALOG ?Inject 2-15 Units into the skin 4 (four) times daily - after meals and at bedtime. Glucose 121 - 150: 2 units, Glucose 151 - 200: 3 units, Glucose 201 - 250: 5 units, Glucose 251 - 300: 8 units, Glucose 301 - 350: 11 units, Glucose 351 - 400: 15 units, Glucose > 400 call MD ?  ?Insulin Pen Needle 32G X 4 MM Misc ?1 each by Does not apply route 4 (four) times daily - after meals and at bedtime. ?  ?LORazepam 0.5 MG tablet ?Commonly known as: ATIVAN ?Take 1 tablet (0.5 mg total) by mouth every 8 (eight) hours. Take for nausea prn ?  ?omeprazole 40 MG capsule ?Commonly known as:  PRILOSEC ?Take 40 mg by mouth every evening. ?  ?ondansetron 8 MG tablet ?Commonly known as: Zofran ?Take 1 tablet (8 mg total) by mouth 2 (two) times daily as needed for refractory nausea / vomiting. Start on day 3 after cyclophosphamide chemotherapy. ?What changed: additional instructions ?  ?potassium chloride SA 20 MEQ tablet ?Commonly known as: Klor-Con M20 ?TAKE 1 TABLET (20 MEQ TOTAL) BY MOUTH DAILY. ?What changed: when to take this ?  ?prochlorperazine 10 MG tablet ?Commonly known as: COMPAZINE ?Take 1 tablet (10 mg total) by mouth every 6 (six) hours as needed (Nausea or vomiting). ?  ?rosuvastatin 10 MG tablet ?Commonly known as: CRESTOR ?Take 10 mg by mouth daily. ?  ?Serevent Diskus 50 MCG/ACT diskus inhaler ?Generic drug: salmeterol ?Inhale 1 puff into the lungs 2 (two) times daily. ?  ?tadalafil 5 MG tablet ?Commonly known as: CIALIS ?Take 5 mg by mouth in the morning. ?  ?tamsulosin 0.4 MG Caps capsule ?Commonly known as: FLOMAX ?Take 0.4 mg by mouth every evening. ?  ?Zinc 50 MG Tabs ?Take 50 mg by mouth daily. ?  ? ?  ? ?  Follow-up Information   ? ? [name], MD Follow up in 1 week(s).   ?Specialty: Family Medicine ?Contact information: ?[address] ?[address] ?[place] [address] ?[phone] ? ? ?  ?  ? ? Candee Furbish

## 2021-08-17 NOTE — Progress Notes (Signed)
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  This is relatively sensitive to most antibiotics.  I do think it is sensitive to Bactrim and Cipro.  As such, he can probably put on oral antibiotics. ? ?He did have the Foley catheter exchange yesterday. ? ?His white cell count is trending upward now.  His ANC is 900. ? ?He does have the atrial fibrillation.  This appears to be more controlled right now. ? ?He said he had a little bit of a temperature yesterday of 100.7.  Currently, his temperature is 98.1. ? ?He is not having any diarrhea. ? ?Is been no cough or shortness of breath.  He has had no nausea or vomiting. ? ?I think that he probably can be discharged today.  Get, his white cells are trending upward now.  He will be put on oral antibiotics for the Citrobacter.  He is going need to be on some kind of prophylactic antibiotics because of the Foley catheter. ? ?He would like to go home today.  Again, his white cell count is trending upward which I think is critical.  He feels better. ? ?We will be able to follow him up in our office. ? ?No he has gotten incredible care from the staff up on 6 E. ? ?Lattie Haw, MD ? ?2 Cor 5:7 ?

## 2021-08-19 LAB — CULTURE, BLOOD (ROUTINE X 2)
Culture: NO GROWTH
Culture: NO GROWTH
Special Requests: ADEQUATE

## 2021-08-23 ENCOUNTER — Other Ambulatory Visit (HOSPITAL_BASED_OUTPATIENT_CLINIC_OR_DEPARTMENT_OTHER): Payer: Self-pay

## 2021-08-26 ENCOUNTER — Inpatient Hospital Stay: Payer: Medicare Other

## 2021-08-26 ENCOUNTER — Other Ambulatory Visit (HOSPITAL_BASED_OUTPATIENT_CLINIC_OR_DEPARTMENT_OTHER): Payer: Self-pay

## 2021-08-26 ENCOUNTER — Encounter: Payer: Self-pay | Admitting: *Deleted

## 2021-08-26 ENCOUNTER — Inpatient Hospital Stay: Payer: Medicare Other | Admitting: Hematology & Oncology

## 2021-08-26 ENCOUNTER — Other Ambulatory Visit: Payer: Self-pay

## 2021-08-26 ENCOUNTER — Encounter: Payer: Self-pay | Admitting: Hematology & Oncology

## 2021-08-26 VITALS — BP 109/80 | HR 97 | Temp 98.0°F | Resp 17 | Wt 167.0 lb

## 2021-08-26 VITALS — BP 89/52 | HR 76 | Temp 97.4°F | Resp 17

## 2021-08-26 DIAGNOSIS — C8338 Diffuse large B-cell lymphoma, lymph nodes of multiple sites: Secondary | ICD-10-CM | POA: Diagnosis not present

## 2021-08-26 DIAGNOSIS — Z5111 Encounter for antineoplastic chemotherapy: Secondary | ICD-10-CM | POA: Diagnosis not present

## 2021-08-26 DIAGNOSIS — Z5189 Encounter for other specified aftercare: Secondary | ICD-10-CM | POA: Diagnosis not present

## 2021-08-26 DIAGNOSIS — Z87891 Personal history of nicotine dependence: Secondary | ICD-10-CM | POA: Diagnosis not present

## 2021-08-26 DIAGNOSIS — E860 Dehydration: Secondary | ICD-10-CM | POA: Diagnosis not present

## 2021-08-26 DIAGNOSIS — Z5112 Encounter for antineoplastic immunotherapy: Secondary | ICD-10-CM | POA: Diagnosis not present

## 2021-08-26 DIAGNOSIS — I714 Abdominal aortic aneurysm, without rupture, unspecified: Secondary | ICD-10-CM | POA: Diagnosis not present

## 2021-08-26 DIAGNOSIS — Z794 Long term (current) use of insulin: Secondary | ICD-10-CM | POA: Diagnosis not present

## 2021-08-26 DIAGNOSIS — C8330 Diffuse large B-cell lymphoma, unspecified site: Secondary | ICD-10-CM | POA: Diagnosis not present

## 2021-08-26 DIAGNOSIS — E1165 Type 2 diabetes mellitus with hyperglycemia: Secondary | ICD-10-CM | POA: Diagnosis not present

## 2021-08-26 LAB — CMP (CANCER CENTER ONLY)
ALT: 23 U/L (ref 0–44)
AST: 21 U/L (ref 15–41)
Albumin: 3.9 g/dL (ref 3.5–5.0)
Alkaline Phosphatase: 68 U/L (ref 38–126)
Anion gap: 11 (ref 5–15)
BUN: 22 mg/dL (ref 8–23)
CO2: 23 mmol/L (ref 22–32)
Calcium: 9.1 mg/dL (ref 8.9–10.3)
Chloride: 101 mmol/L (ref 98–111)
Creatinine: 0.8 mg/dL (ref 0.61–1.24)
GFR, Estimated: >60 mL/min (ref >60)
Glucose, Bld: 325 mg/dL — ABNORMAL HIGH (ref 70–99)
Potassium: 3.8 mmol/L (ref 3.5–5.1)
Sodium: 135 mmol/L (ref 135–145)
Total Bilirubin: 0.7 mg/dL (ref 0.3–1.2)
Total Protein: 6.5 g/dL (ref 6.5–8.1)

## 2021-08-26 LAB — CBC WITH DIFFERENTIAL (CANCER CENTER ONLY)
Abs Immature Granulocytes: 0.04 10*3/uL (ref 0.00–0.07)
Basophils Absolute: 0 10*3/uL (ref 0.0–0.1)
Basophils Relative: 1 %
Eosinophils Absolute: 0 10*3/uL (ref 0.0–0.5)
Eosinophils Relative: 0 %
HCT: 30.8 % — ABNORMAL LOW (ref 39.0–52.0)
Hemoglobin: 10.2 g/dL — ABNORMAL LOW (ref 13.0–17.0)
Immature Granulocytes: 1 %
Lymphocytes Relative: 32 %
Lymphs Abs: 1.7 10*3/uL (ref 0.7–4.0)
MCH: 30.3 pg (ref 26.0–34.0)
MCHC: 33.1 g/dL (ref 30.0–36.0)
MCV: 91.4 fL (ref 80.0–100.0)
Monocytes Absolute: 0.8 10*3/uL (ref 0.1–1.0)
Monocytes Relative: 15 %
Neutro Abs: 2.7 10*3/uL (ref 1.7–7.7)
Neutrophils Relative %: 51 %
Platelet Count: 170 10*3/uL (ref 150–400)
RBC: 3.37 MIL/uL — ABNORMAL LOW (ref 4.22–5.81)
RDW: 19.7 % — ABNORMAL HIGH (ref 11.5–15.5)
WBC Count: 5.3 10*3/uL (ref 4.0–10.5)
nRBC: 0.4 % — ABNORMAL HIGH (ref 0.0–0.2)

## 2021-08-26 LAB — SAMPLE TO BLOOD BANK

## 2021-08-26 MED ORDER — SODIUM CHLORIDE 0.9 % IV SOLN
375.0000 mg/m2 | Freq: Once | INTRAVENOUS | Status: AC
  Administered 2021-08-26: 800 mg via INTRAVENOUS
  Filled 2021-08-26: qty 50

## 2021-08-26 MED ORDER — SODIUM CHLORIDE 0.9 % IV SOLN
10.0000 mg | Freq: Once | INTRAVENOUS | Status: AC
  Administered 2021-08-26: 10 mg via INTRAVENOUS
  Filled 2021-08-26: qty 10

## 2021-08-26 MED ORDER — DIPHENHYDRAMINE HCL 25 MG PO CAPS
50.0000 mg | ORAL_CAPSULE | Freq: Once | ORAL | Status: AC
  Administered 2021-08-26: 50 mg via ORAL
  Filled 2021-08-26: qty 2

## 2021-08-26 MED ORDER — VINCRISTINE SULFATE CHEMO INJECTION 1 MG/ML
2.0000 mg | Freq: Once | INTRAVENOUS | Status: AC
  Administered 2021-08-26: 2 mg via INTRAVENOUS
  Filled 2021-08-26: qty 2

## 2021-08-26 MED ORDER — SODIUM CHLORIDE 0.9 % IV SOLN: Freq: Once | INTRAVENOUS | Status: AC

## 2021-08-26 MED ORDER — SODIUM CHLORIDE 0.9% FLUSH
10.0000 mL | INTRAVENOUS | Status: DC | PRN
  Administered 2021-08-26: 10 mL

## 2021-08-26 MED ORDER — HEPARIN SOD (PORK) LOCK FLUSH 100 UNIT/ML IV SOLN
500.0000 [IU] | Freq: Once | INTRAVENOUS | Status: AC | PRN
  Administered 2021-08-26: 500 [IU]

## 2021-08-26 MED ORDER — SODIUM CHLORIDE 0.9 % IV SOLN
750.0000 mg/m2 | Freq: Once | INTRAVENOUS | Status: AC
  Administered 2021-08-26: 1600 mg via INTRAVENOUS
  Filled 2021-08-26: qty 80

## 2021-08-26 MED ORDER — DOXORUBICIN HCL CHEMO IV INJECTION 2 MG/ML
50.0000 mg/m2 | Freq: Once | INTRAVENOUS | Status: AC
  Administered 2021-08-26: 106 mg via INTRAVENOUS
  Filled 2021-08-26: qty 53

## 2021-08-26 MED ORDER — PALONOSETRON HCL INJECTION 0.25 MG/5ML
0.2500 mg | Freq: Once | INTRAVENOUS | Status: AC
  Administered 2021-08-26: 0.25 mg via INTRAVENOUS
  Filled 2021-08-26: qty 5

## 2021-08-26 MED ORDER — SODIUM CHLORIDE 0.9 % IV SOLN
150.0000 mg | Freq: Once | INTRAVENOUS | Status: AC
  Administered 2021-08-26: 150 mg via INTRAVENOUS
  Filled 2021-08-26: qty 150

## 2021-08-26 MED ORDER — ACETAMINOPHEN 325 MG PO TABS
650.0000 mg | ORAL_TABLET | Freq: Once | ORAL | Status: AC
  Administered 2021-08-26: 650 mg via ORAL
  Filled 2021-08-26: qty 2

## 2021-08-26 MED ORDER — SULFAMETHOXAZOLE-TRIMETHOPRIM 800-160 MG PO TABS
1.0000 | ORAL_TABLET | Freq: Every day | ORAL | 4 refills | Status: DC
Start: 2021-08-26 — End: 2021-11-10
  Filled 2021-08-26: qty 20, 20d supply, fill #0
  Filled 2021-09-07 – 2021-09-19 (×3): qty 20, 20d supply, fill #1
  Filled 2021-10-05: qty 20, 20d supply, fill #2
  Filled 2021-10-17: qty 20, 20d supply, fill #3
  Filled 2021-11-01: qty 20, 20d supply, fill #4

## 2021-08-26 NOTE — Patient Instructions (Signed)

## 2021-08-26 NOTE — Patient Instructions (Addendum)
[facility]  Discharge Instructions: ?Thank you for choosing [facility] to provide your oncology and hematology care.  ? ?If you have a lab appointment with the [facility], please go directly to the [facility] and check in at the registration area. ? ?Wear comfortable clothing and clothing appropriate for easy access to any Portacath or PICC line.  ? ?We strive to give you quality time with your provider. You may need to reschedule your appointment if you arrive late (15 or more minutes).  Arriving late affects you and other patients whose appointments are after yours.  Also, if you miss three or more appointments without notifying the office, you may be dismissed from the clinic at the provider?s discretion.    ?  ?For prescription refill requests, have your pharmacy contact our office and allow 72 hours for refills to be completed.   ? ?Today you received the following chemotherapy and/or immunotherapy agents rituxan,adria,cytoxan vincristine  ?  ?To help prevent nausea and vomiting after your treatment, we encourage you to take your nausea medication as directed. ? ?BELOW ARE SYMPTOMS THAT SHOULD BE REPORTED IMMEDIATELY: ?*FEVER GREATER THAN 100.4 F (38 ?C) OR HIGHER ?*CHILLS OR SWEATING ?*NAUSEA AND VOMITING THAT IS NOT CONTROLLED WITH YOUR NAUSEA MEDICATION ?*UNUSUAL SHORTNESS OF BREATH ?*UNUSUAL BRUISING OR BLEEDING ?*URINARY PROBLEMS (pain or burning when urinating, or frequent urination) ?*BOWEL PROBLEMS (unusual diarrhea, constipation, pain near the anus) ?TENDERNESS IN MOUTH AND THROAT WITH OR WITHOUT PRESENCE OF ULCERS (sore throat, sores in mouth, or a toothache) ?UNUSUAL RASH, SWELLING OR PAIN  ?UNUSUAL VAGINAL DISCHARGE OR ITCHING  ? ?Items with * indicate a potential emergency and should be followed up as soon as possible or go to the Emergency Department if any problems should occur. ? ?Please show the CHEMOTHERAPY ALERT CARD or IMMUNOTHERAPY ALERT  CARD at check-in to the Emergency Department and triage nurse. ?Should you have questions after your visit or need to cancel or reschedule your appointment, please contact [facility]  [phone] and follow the prompts.  Office hours are 8:00 a.m. to 4:30 p.m. Monday - Friday. Please note that voicemails left after 4:00 p.m. may not be returned until the following business day.  We are closed weekends and major holidays. You have access to a nurse at all times for urgent questions. Please call the main number to the clinic [phone] and follow the prompts. ? ?For any non-urgent questions, you may also contact your provider using MyChart. We now offer e-Visits for anyone 65 and older to request care online for non-urgent symptoms. For details visit [email or web address]. ?  ?Also download the MyChart app! Go to the app store, search "MyChart", open the app, select [facility], and log in with your MyChart username and password. ? ?Due to Covid, a mask is required upon entering the hospital/clinic. If you do not have a mask, one will be given to you upon arrival. For doctor visits, patients may have 1 support person aged 55 or older with them. For treatment visits, patients cannot have anyone with them due to current Covid guidelines and our immunocompromised population.  ?

## 2021-08-26 NOTE — Progress Notes (Signed)
?Hematology and Oncology Follow Up Visit ? ?[name] ?[id number] ?27-Sep-1955 66 y.o. ?08/26/2021 ? ? 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 ?Current Therapy:   ?R-CHOP --  s/p cycle #4-- started on 05/20/2021 ?    ?Interim History:  [name] is back for for follow-up.  Unfortunately, he was back in the hospital after his last chemotherapy.  About a week or so afterwards, he was then because of Citrobacter.  He has an indwelling Foley catheter.  The Citrobacter was in his urine.  He did have the Foley catheter exchanged. ? ?He is blood sugars have been on the high side.  They have always been on the high side. ? ?He is eating okay.  He is having no diarrhea.  There is no cough or shortness of breath.  He has had no rashes.  There is been no bleeding.  He has had no mouth sores. ? ?He has had no problems with the left elbow.  This was addressed by Dr. Rhona Raider of Orthopedic Surgery. ? ?He has had no headache. ? ?He does feel tired.  He is little bit anemic. ? ?Overall, I would say his performance status is probably ECOG 1.   ? ? ?Medications:  ?Current Outpatient Medications:  ?  acetaminophen (TYLENOL) 325 MG tablet, Take 650 mg by mouth every 6 (six) hours as needed for mild pain or headache., Disp: , Rfl:  ?  albuterol (PROAIR HFA) 108 (90 Base) MCG/ACT inhaler, Inhale 1-2 puffs into the lungs every 6 (six) hours as needed for wheezing or shortness of breath., Disp: 8 g, Rfl: 6 ?  alendronate (FOSAMAX) 70 MG tablet, Take 70 mg by mouth every Sunday., Disp: , Rfl:  ?  allopurinol (ZYLOPRIM) 100 MG tablet, Take 1 tablet (100 mg total) by mouth daily. Start 3 days BEFORE chemotherapy starts (Patient not taking: Reported on 08/13/2021), Disp: 30 tablet, Rfl: 0 ?  aspirin EC 81 MG EC tablet, Take 1 tablet (81 mg total) by mouth daily at 6 (six) AM. Swallow whole. (Patient taking differently: Take 81 mg by mouth in the morning. Swallow whole.), Disp: 30 tablet, Rfl: 11 ?   atenolol (TENORMIN) 100 MG tablet, Take 100 mg by mouth daily., Disp: , Rfl:  ?  blood glucose meter kit and supplies KIT, Dispense based on patient and insurance preference. Use up to four times daily as directed., Disp: 1 each, Rfl: 0 ?  calcium carbonate (OSCAL) 1500 (600 Ca) MG TABS tablet, Take 600 mg of elemental calcium by mouth daily with breakfast., Disp: , Rfl:  ?  Continuous Blood Gluc Receiver (FREESTYLE LIBRE 2 READER) DEVI, , Disp: , Rfl:  ?  Continuous Blood Gluc Sensor (FREESTYLE LIBRE 14 DAY SENSOR) MISC, Inject 1 Device into the skin every 14 (fourteen) days., Disp: , Rfl:  ?  diclofenac Sodium (VOLTAREN) 1 % GEL, Apply 2 g topically daily as needed (to painful sites)., Disp: , Rfl:  ?  DULoxetine (CYMBALTA) 60 MG capsule, Take 60 mg by mouth at bedtime., Disp: , Rfl:  ?  famciclovir (FAMVIR) 250 MG tablet, Take 1 tablet (250 mg total) by mouth 2 (two) times daily., Disp: 30 tablet, Rfl: 8 ?  Fish Oil OIL, Take 1,500 mg by mouth daily., Disp: , Rfl:  ?  insulin glargine (LANTUS) 100 UNIT/ML Solostar Pen, Inject 25 Units into the skin daily., Disp: 15 mL, Rfl: 11 ?  insulin lispro (HUMALOG) 100 UNIT/ML KwikPen, Inject  2-15 Units into the skin 4 (four) times daily - after meals and at bedtime. Glucose 121 - 150: 2 units, Glucose 151 - 200: 3 units, Glucose 201 - 250: 5 units, Glucose 251 - 300: 8 units, Glucose 301 - 350: 11 units, Glucose 351 - 400: 15 units, Glucose > 400 call MD, Disp: 15 mL, Rfl: 11 ?  Insulin Pen Needle 32G X 4 MM MISC, 1 each by Does not apply route 4 (four) times daily - after meals and at bedtime., Disp: 100 each, Rfl: 5 ?  LORazepam (ATIVAN) 0.5 MG tablet, Take 1 tablet (0.5 mg total) by mouth every 8 (eight) hours. Take for nausea prn (Patient not taking: Reported on 07/18/2021), Disp: 30 tablet, Rfl: 1 ?  omeprazole (PRILOSEC) 40 MG capsule, Take 40 mg by mouth every evening., Disp: , Rfl:  ?  ondansetron (ZOFRAN) 8 MG tablet, Take 1 tablet (8 mg total) by mouth 2 (two)  times daily as needed for refractory nausea / vomiting. Start on day 3 after cyclophosphamide chemotherapy. (Patient taking differently: Take 8 mg by mouth 2 (two) times daily as needed for refractory nausea / vomiting.), Disp: 30 tablet, Rfl: 1 ?  potassium chloride SA (KLOR-CON M20) 20 MEQ tablet, TAKE 1 TABLET (20 MEQ TOTAL) BY MOUTH DAILY.  (Patient taking differently: Take 20 mEq by mouth in the morning.), Disp: 30 tablet, Rfl: 5 ?  predniSONE (DELTASONE) 20 MG tablet, Take 60 mg by mouth daily., Disp: , Rfl:  ?  prochlorperazine (COMPAZINE) 10 MG tablet, Take 1 tablet (10 mg total) by mouth every 6 (six) hours as needed (Nausea or vomiting). (Patient not taking: Reported on 07/18/2021), Disp: 30 tablet, Rfl: 6 ?  rosuvastatin (CRESTOR) 10 MG tablet, Take 10 mg by mouth daily., Disp: , Rfl:  ?  SEREVENT DISKUS 50 MCG/ACT diskus inhaler, Inhale 1 puff into the lungs 2 (two) times daily., Disp: , Rfl:  ?  tadalafil (CIALIS) 5 MG tablet, Take 5 mg by mouth in the morning., Disp: , Rfl:  ?  tamsulosin (FLOMAX) 0.4 MG CAPS capsule, Take 0.4 mg by mouth every evening., Disp: , Rfl:  ?  Zinc 50 MG TABS, Take 50 mg by mouth daily., Disp: , Rfl:  ? ?Allergies:  ?Allergies  ?Allergen Reactions  ? Fexofenadine Other (See Comments)  ?  "dries me out too much"  ? Hydrochlorothiazide Other (See Comments)  ?  Cramps  ? Penicillins Hives  ? Pravastatin Sodium Other (See Comments)  ?  Aches  ? Requip [Ropinirole Hcl] Other (See Comments)  ?  Bad dreams  ? ? ?Past Medical History, Surgical history, Social history, and Family History were reviewed and updated. ? ?Review of Systems: ?Review of Systems  ?Constitutional: Negative.   ?HENT:  Negative.    ?Eyes: Negative.   ?Respiratory: Negative.    ?Cardiovascular: Negative.   ?Gastrointestinal: Negative.   ?Endocrine: Negative.   ?Genitourinary: Negative.    ?Musculoskeletal: Negative.   ?Skin: Negative.   ?Neurological: Negative.   ?Hematological: Negative.    ?Psychiatric/Behavioral: Negative.    ? ?Physical Exam: ? weight is 167 lb (75.8 kg). His oral temperature is 98 ?F (36.7 ?C). His blood pressure is 109/80 and his pulse is 97. His respiration is 17 and oxygen saturation is 100%.  ? ?Wt Readings from Last 3 Encounters:  ?08/26/21 167 lb (75.8 kg)  ?08/13/21 160 lb 15 oz (73 kg)  ?08/05/21 161 lb (73 kg)  ?His vital signs show temperature of 98.6.  Pulse 72.  Blood pressure 103/62.  Weight is 180 pounds. ? ?Physical Exam ?Vitals reviewed.  ?HENT:  ?   Head: Normocephalic and atraumatic.  ?Eyes:  ?   Pupils: Pupils are equal, round, and reactive to light.  ?Cardiovascular:  ?   Rate and Rhythm: Normal rate and regular rhythm.  ?   Heart sounds: Normal heart sounds.  ?Pulmonary:  ?   Effort: Pulmonary effort is normal.  ?   Breath sounds: Normal breath sounds.  ?Abdominal:  ?   General: Bowel sounds are normal.  ?   Palpations: Abdomen is soft.  ?Musculoskeletal:     ?   General: No tenderness or deformity. Normal range of motion.  ?   Cervical back: Normal range of motion.  ?   Comments: Evaluation of her left elbow does show some fluctuance.  There is some erythema.  There is slight tenderness to palpation.  There is no exudate from the eschar.  He has decent range of motion of the elbow.  ?Lymphadenopathy:  ?   Cervical: No cervical adenopathy.  ?Skin: ?   General: Skin is warm and dry.  ?   Findings: No erythema or rash.  ?Neurological:  ?   Mental Status: He is alert and oriented to person, place, and time.  ?Psychiatric:     ?   Behavior: Behavior normal.     ?   Thought Content: Thought content normal.     ?   Judgment: Judgment normal.  ? ? ? ?Lab Results  ?Component Value Date  ? WBC 5.3 08/26/2021  ? HGB 10.2 (L) 08/26/2021  ? HCT 30.8 (L) 08/26/2021  ? MCV 91.4 08/26/2021  ? PLT 170 08/26/2021  ? ?  Chemistry   ?   ?Component Value Date/Time  ? NA 135 08/17/2021 0715  ? K 4.1 08/17/2021 0715  ? CL 103 08/17/2021 0715  ? CO2 24 08/17/2021 0715  ? BUN 13  08/17/2021 0715  ? CREATININE 0.65 08/17/2021 0715  ? CREATININE 0.80 08/05/2021 0920  ?    ?Component Value Date/Time  ? CALCIUM 8.2 (L) 08/17/2021 0715  ? ALKPHOS 64 08/15/2021 0522  ? AST 17 08/15/2021 0522  ? AST 20 08/05/2021

## 2021-08-26 NOTE — Progress Notes (Signed)
Patient is here for cycle five. He was again hospitalized after his last cycle. Dr Marin Olp will put him on prophylactic antibiotics and bring him in twice next week to try to avoid any hospitalizations after this cycle. Patient is in good spirits and is ready to proceed. ? ?Oncology Nurse Navigator Documentation ? ? ?  08/26/2021  ?  9:00 AM  ?Oncology Nurse Navigator Flowsheets  ?Navigator Follow Up Date: 09/16/2021  ?Navigator Follow Up Reason: Follow-up Appointment  ?Navigator Location [facility]  ?Navigator Encounter Type Treatment;Appt/Treatment Plan Review  ?Patient Visit Type MedOnc  ?Treatment Phase Active Tx  ?Barriers/Navigation Needs Coordination of Care;Education  ?Interventions Psycho-Social Support  ?Acuity Level 2-Minimal Needs (1-2 Barriers Identified)  ?Support Groups/Services Friends and Family  ?Time Spent with Patient 15  ?  ?

## 2021-08-29 ENCOUNTER — Inpatient Hospital Stay: Payer: Medicare Other

## 2021-08-29 VITALS — BP 144/79 | HR 73 | Temp 97.8°F | Resp 18

## 2021-08-29 DIAGNOSIS — Z794 Long term (current) use of insulin: Secondary | ICD-10-CM | POA: Diagnosis not present

## 2021-08-29 DIAGNOSIS — Z5111 Encounter for antineoplastic chemotherapy: Secondary | ICD-10-CM | POA: Diagnosis not present

## 2021-08-29 DIAGNOSIS — M25552 Pain in left hip: Secondary | ICD-10-CM

## 2021-08-29 DIAGNOSIS — I714 Abdominal aortic aneurysm, without rupture, unspecified: Secondary | ICD-10-CM | POA: Diagnosis not present

## 2021-08-29 DIAGNOSIS — Z87891 Personal history of nicotine dependence: Secondary | ICD-10-CM | POA: Diagnosis not present

## 2021-08-29 DIAGNOSIS — S2249XS Multiple fractures of ribs, unspecified side, sequela: Secondary | ICD-10-CM

## 2021-08-29 DIAGNOSIS — C8330 Diffuse large B-cell lymphoma, unspecified site: Secondary | ICD-10-CM | POA: Diagnosis not present

## 2021-08-29 DIAGNOSIS — Z5112 Encounter for antineoplastic immunotherapy: Secondary | ICD-10-CM | POA: Diagnosis not present

## 2021-08-29 DIAGNOSIS — E860 Dehydration: Secondary | ICD-10-CM | POA: Diagnosis not present

## 2021-08-29 DIAGNOSIS — C8338 Diffuse large B-cell lymphoma, lymph nodes of multiple sites: Secondary | ICD-10-CM

## 2021-08-29 DIAGNOSIS — Z5189 Encounter for other specified aftercare: Secondary | ICD-10-CM | POA: Diagnosis not present

## 2021-08-29 DIAGNOSIS — E1165 Type 2 diabetes mellitus with hyperglycemia: Secondary | ICD-10-CM | POA: Diagnosis not present

## 2021-08-29 MED ORDER — SODIUM CHLORIDE 0.9 % IV SOLN: Freq: Once | INTRAVENOUS | Status: AC

## 2021-08-29 MED ORDER — HEPARIN SOD (PORK) LOCK FLUSH 100 UNIT/ML IV SOLN
500.0000 [IU] | Freq: Once | INTRAVENOUS | Status: AC | PRN
  Administered 2021-08-29: 500 [IU]

## 2021-08-29 MED ORDER — SODIUM CHLORIDE 0.9% FLUSH
10.0000 mL | Freq: Once | INTRAVENOUS | Status: AC | PRN
  Administered 2021-08-29: 10 mL

## 2021-08-29 MED ORDER — PEGFILGRASTIM-BMEZ 6 MG/0.6ML ~~LOC~~ SOSY
6.0000 mg | PREFILLED_SYRINGE | Freq: Once | SUBCUTANEOUS | Status: AC
  Administered 2021-08-29: 6 mg via SUBCUTANEOUS
  Filled 2021-08-29: qty 0.6

## 2021-08-29 NOTE — Progress Notes (Signed)
Pt request to be discharged. VSS. ?

## 2021-08-29 NOTE — Patient Instructions (Signed)

## 2021-09-02 ENCOUNTER — Inpatient Hospital Stay: Payer: Medicare Other

## 2021-09-02 VITALS — BP 106/59 | HR 99 | Temp 98.6°F | Resp 17

## 2021-09-02 DIAGNOSIS — Z87891 Personal history of nicotine dependence: Secondary | ICD-10-CM | POA: Diagnosis not present

## 2021-09-02 DIAGNOSIS — E1165 Type 2 diabetes mellitus with hyperglycemia: Secondary | ICD-10-CM | POA: Diagnosis not present

## 2021-09-02 DIAGNOSIS — Z794 Long term (current) use of insulin: Secondary | ICD-10-CM | POA: Diagnosis not present

## 2021-09-02 DIAGNOSIS — Z5112 Encounter for antineoplastic immunotherapy: Secondary | ICD-10-CM | POA: Diagnosis not present

## 2021-09-02 DIAGNOSIS — C8330 Diffuse large B-cell lymphoma, unspecified site: Secondary | ICD-10-CM | POA: Diagnosis not present

## 2021-09-02 DIAGNOSIS — E860 Dehydration: Secondary | ICD-10-CM | POA: Diagnosis not present

## 2021-09-02 DIAGNOSIS — Z5189 Encounter for other specified aftercare: Secondary | ICD-10-CM | POA: Diagnosis not present

## 2021-09-02 DIAGNOSIS — M25552 Pain in left hip: Secondary | ICD-10-CM

## 2021-09-02 DIAGNOSIS — C8338 Diffuse large B-cell lymphoma, lymph nodes of multiple sites: Secondary | ICD-10-CM

## 2021-09-02 DIAGNOSIS — Z5111 Encounter for antineoplastic chemotherapy: Secondary | ICD-10-CM | POA: Diagnosis not present

## 2021-09-02 DIAGNOSIS — S2249XS Multiple fractures of ribs, unspecified side, sequela: Secondary | ICD-10-CM

## 2021-09-02 DIAGNOSIS — I714 Abdominal aortic aneurysm, without rupture, unspecified: Secondary | ICD-10-CM | POA: Diagnosis not present

## 2021-09-02 LAB — CBC WITH DIFFERENTIAL (CANCER CENTER ONLY)
Abs Immature Granulocytes: 0.1 10*3/uL — ABNORMAL HIGH (ref 0.00–0.07)
Basophils Absolute: 0 10*3/uL (ref 0.0–0.1)
Basophils Relative: 1 %
Eosinophils Absolute: 0 10*3/uL (ref 0.0–0.5)
Eosinophils Relative: 0 %
HCT: 27.5 % — ABNORMAL LOW (ref 39.0–52.0)
Hemoglobin: 9.3 g/dL — ABNORMAL LOW (ref 13.0–17.0)
Immature Granulocytes: 5 %
Lymphocytes Relative: 17 %
Lymphs Abs: 0.4 10*3/uL — ABNORMAL LOW (ref 0.7–4.0)
MCH: 31 pg (ref 26.0–34.0)
MCHC: 33.8 g/dL (ref 30.0–36.0)
MCV: 91.7 fL (ref 80.0–100.0)
Monocytes Absolute: 0.1 10*3/uL (ref 0.1–1.0)
Monocytes Relative: 3 %
Neutro Abs: 1.6 10*3/uL — ABNORMAL LOW (ref 1.7–7.7)
Neutrophils Relative %: 74 %
Platelet Count: 50 10*3/uL — ABNORMAL LOW (ref 150–400)
RBC: 3 MIL/uL — ABNORMAL LOW (ref 4.22–5.81)
RDW: 17.4 % — ABNORMAL HIGH (ref 11.5–15.5)
Smear Review: NORMAL
WBC Count: 2.2 10*3/uL — ABNORMAL LOW (ref 4.0–10.5)
nRBC: 0 % (ref 0.0–0.2)

## 2021-09-02 LAB — CMP (CANCER CENTER ONLY)
ALT: 42 U/L (ref 0–44)
AST: 14 U/L — ABNORMAL LOW (ref 15–41)
Albumin: 3.7 g/dL (ref 3.5–5.0)
Alkaline Phosphatase: 77 U/L (ref 38–126)
Anion gap: 8 (ref 5–15)
BUN: 23 mg/dL (ref 8–23)
CO2: 26 mmol/L (ref 22–32)
Calcium: 8.8 mg/dL — ABNORMAL LOW (ref 8.9–10.3)
Chloride: 100 mmol/L (ref 98–111)
Creatinine: 0.74 mg/dL (ref 0.61–1.24)
GFR, Estimated: >60 mL/min (ref >60)
Glucose, Bld: 358 mg/dL — ABNORMAL HIGH (ref 70–99)
Potassium: 4.2 mmol/L (ref 3.5–5.1)
Sodium: 134 mmol/L — ABNORMAL LOW (ref 135–145)
Total Bilirubin: 0.8 mg/dL (ref 0.3–1.2)
Total Protein: 5.9 g/dL — ABNORMAL LOW (ref 6.5–8.1)

## 2021-09-02 MED ORDER — SODIUM CHLORIDE 0.9% FLUSH
10.0000 mL | Freq: Once | INTRAVENOUS | Status: AC | PRN
  Administered 2021-09-02: 10 mL

## 2021-09-02 MED ORDER — SODIUM CHLORIDE 0.9 % IV SOLN: Freq: Once | INTRAVENOUS | Status: AC

## 2021-09-02 MED ORDER — HEPARIN SOD (PORK) LOCK FLUSH 100 UNIT/ML IV SOLN
500.0000 [IU] | Freq: Once | INTRAVENOUS | Status: AC | PRN
  Administered 2021-09-02: 500 [IU]

## 2021-09-02 NOTE — Patient Instructions (Signed)

## 2021-09-02 NOTE — Patient Instructions (Signed)

## 2021-09-05 ENCOUNTER — Inpatient Hospital Stay: Payer: Medicare Other | Attending: Physician Assistant

## 2021-09-05 ENCOUNTER — Inpatient Hospital Stay: Payer: Medicare Other

## 2021-09-05 ENCOUNTER — Other Ambulatory Visit: Payer: Self-pay | Admitting: *Deleted

## 2021-09-05 ENCOUNTER — Telehealth: Payer: Self-pay | Admitting: *Deleted

## 2021-09-05 VITALS — BP 100/61 | HR 73 | Temp 98.0°F | Resp 17

## 2021-09-05 DIAGNOSIS — N390 Urinary tract infection, site not specified: Secondary | ICD-10-CM | POA: Insufficient documentation

## 2021-09-05 DIAGNOSIS — E860 Dehydration: Secondary | ICD-10-CM

## 2021-09-05 DIAGNOSIS — C8338 Diffuse large B-cell lymphoma, lymph nodes of multiple sites: Secondary | ICD-10-CM

## 2021-09-05 DIAGNOSIS — Z5189 Encounter for other specified aftercare: Secondary | ICD-10-CM | POA: Diagnosis not present

## 2021-09-05 DIAGNOSIS — Z5112 Encounter for antineoplastic immunotherapy: Secondary | ICD-10-CM | POA: Insufficient documentation

## 2021-09-05 DIAGNOSIS — D649 Anemia, unspecified: Secondary | ICD-10-CM | POA: Diagnosis not present

## 2021-09-05 DIAGNOSIS — E1165 Type 2 diabetes mellitus with hyperglycemia: Secondary | ICD-10-CM | POA: Diagnosis not present

## 2021-09-05 DIAGNOSIS — I714 Abdominal aortic aneurysm, without rupture, unspecified: Secondary | ICD-10-CM | POA: Diagnosis not present

## 2021-09-05 DIAGNOSIS — Z794 Long term (current) use of insulin: Secondary | ICD-10-CM | POA: Insufficient documentation

## 2021-09-05 DIAGNOSIS — C8330 Diffuse large B-cell lymphoma, unspecified site: Secondary | ICD-10-CM | POA: Diagnosis not present

## 2021-09-05 DIAGNOSIS — B961 Klebsiella pneumoniae [K. pneumoniae] as the cause of diseases classified elsewhere: Secondary | ICD-10-CM | POA: Insufficient documentation

## 2021-09-05 DIAGNOSIS — Z95828 Presence of other vascular implants and grafts: Secondary | ICD-10-CM

## 2021-09-05 DIAGNOSIS — Z5111 Encounter for antineoplastic chemotherapy: Secondary | ICD-10-CM | POA: Diagnosis not present

## 2021-09-05 DIAGNOSIS — S2249XS Multiple fractures of ribs, unspecified side, sequela: Secondary | ICD-10-CM

## 2021-09-05 DIAGNOSIS — M25552 Pain in left hip: Secondary | ICD-10-CM

## 2021-09-05 LAB — CBC WITH DIFFERENTIAL (CANCER CENTER ONLY)
Abs Immature Granulocytes: 0.09 10*3/uL — ABNORMAL HIGH (ref 0.00–0.07)
Basophils Absolute: 0.1 10*3/uL (ref 0.0–0.1)
Basophils Relative: 2 %
Eosinophils Absolute: 0 10*3/uL (ref 0.0–0.5)
Eosinophils Relative: 0 %
HCT: 25.3 % — ABNORMAL LOW (ref 39.0–52.0)
Hemoglobin: 8.9 g/dL — ABNORMAL LOW (ref 13.0–17.0)
Immature Granulocytes: 3 %
Lymphocytes Relative: 15 %
Lymphs Abs: 0.5 10*3/uL — ABNORMAL LOW (ref 0.7–4.0)
MCH: 31 pg (ref 26.0–34.0)
MCHC: 35.2 g/dL (ref 30.0–36.0)
MCV: 88.2 fL (ref 80.0–100.0)
Monocytes Absolute: 0.5 10*3/uL (ref 0.1–1.0)
Monocytes Relative: 14 %
Neutro Abs: 2.1 10*3/uL (ref 1.7–7.7)
Neutrophils Relative %: 66 %
Platelet Count: 31 10*3/uL — ABNORMAL LOW (ref 150–400)
RBC: 2.87 MIL/uL — ABNORMAL LOW (ref 4.22–5.81)
RDW: 15.8 % — ABNORMAL HIGH (ref 11.5–15.5)
Smear Review: NORMAL
WBC Count: 3.2 10*3/uL — ABNORMAL LOW (ref 4.0–10.5)
nRBC: 0 % (ref 0.0–0.2)

## 2021-09-05 LAB — CMP (CANCER CENTER ONLY)
ALT: 19 U/L (ref 0–44)
AST: 9 U/L — ABNORMAL LOW (ref 15–41)
Albumin: 3.8 g/dL (ref 3.5–5.0)
Alkaline Phosphatase: 63 U/L (ref 38–126)
Anion gap: 13 (ref 5–15)
BUN: 31 mg/dL — ABNORMAL HIGH (ref 8–23)
CO2: 22 mmol/L (ref 22–32)
Calcium: 9.5 mg/dL (ref 8.9–10.3)
Chloride: 93 mmol/L — ABNORMAL LOW (ref 98–111)
Creatinine: 0.96 mg/dL (ref 0.61–1.24)
GFR, Estimated: >60 mL/min (ref >60)
Glucose, Bld: 491 mg/dL — ABNORMAL HIGH (ref 70–99)
Potassium: 4.3 mmol/L (ref 3.5–5.1)
Sodium: 128 mmol/L — ABNORMAL LOW (ref 135–145)
Total Bilirubin: 1.1 mg/dL (ref 0.3–1.2)
Total Protein: 6.5 g/dL (ref 6.5–8.1)

## 2021-09-05 LAB — SAMPLE TO BLOOD BANK

## 2021-09-05 LAB — PREPARE RBC (CROSSMATCH)

## 2021-09-05 MED ORDER — SODIUM CHLORIDE 0.9% FLUSH
10.0000 mL | INTRAVENOUS | Status: AC | PRN
  Administered 2021-09-05: 10 mL

## 2021-09-05 MED ORDER — DIPHENHYDRAMINE HCL 25 MG PO CAPS
25.0000 mg | ORAL_CAPSULE | Freq: Once | ORAL | Status: AC
  Administered 2021-09-05: 25 mg via ORAL
  Filled 2021-09-05: qty 1

## 2021-09-05 MED ORDER — SODIUM CHLORIDE 0.9 % IV SOLN: Freq: Once | INTRAVENOUS | Status: AC

## 2021-09-05 MED ORDER — ACETAMINOPHEN 325 MG PO TABS
650.0000 mg | ORAL_TABLET | Freq: Once | ORAL | Status: AC
  Administered 2021-09-05: 650 mg via ORAL
  Filled 2021-09-05: qty 2

## 2021-09-05 MED ORDER — HEPARIN SOD (PORK) LOCK FLUSH 100 UNIT/ML IV SOLN
500.0000 [IU] | Freq: Every day | INTRAVENOUS | Status: AC | PRN
  Administered 2021-09-05: 500 [IU]

## 2021-09-05 NOTE — Patient Instructions (Signed)

## 2021-09-05 NOTE — Patient Instructions (Signed)

## 2021-09-05 NOTE — Telephone Encounter (Signed)
Call received from patient stating that he had a "rough weekend" and would like to know if he can come in today instead of tomorrow for IVF's.  Dr. Marin Olp notified and order received for pt to come in today for labs and IVF's.  Pt notified and above and states that he can be here by 9:00AM this morning.  Message sent to scheduling.  ?

## 2021-09-06 ENCOUNTER — Other Ambulatory Visit: Payer: Self-pay | Admitting: *Deleted

## 2021-09-06 ENCOUNTER — Inpatient Hospital Stay: Payer: Medicare Other

## 2021-09-06 DIAGNOSIS — Z794 Long term (current) use of insulin: Secondary | ICD-10-CM | POA: Diagnosis not present

## 2021-09-06 DIAGNOSIS — Z5111 Encounter for antineoplastic chemotherapy: Secondary | ICD-10-CM | POA: Diagnosis not present

## 2021-09-06 DIAGNOSIS — E1165 Type 2 diabetes mellitus with hyperglycemia: Secondary | ICD-10-CM | POA: Diagnosis not present

## 2021-09-06 DIAGNOSIS — D649 Anemia, unspecified: Secondary | ICD-10-CM | POA: Diagnosis not present

## 2021-09-06 DIAGNOSIS — Z5189 Encounter for other specified aftercare: Secondary | ICD-10-CM | POA: Diagnosis not present

## 2021-09-06 DIAGNOSIS — N390 Urinary tract infection, site not specified: Secondary | ICD-10-CM | POA: Diagnosis not present

## 2021-09-06 DIAGNOSIS — C8330 Diffuse large B-cell lymphoma, unspecified site: Secondary | ICD-10-CM | POA: Diagnosis not present

## 2021-09-06 DIAGNOSIS — I714 Abdominal aortic aneurysm, without rupture, unspecified: Secondary | ICD-10-CM | POA: Diagnosis not present

## 2021-09-06 DIAGNOSIS — Z5112 Encounter for antineoplastic immunotherapy: Secondary | ICD-10-CM | POA: Diagnosis not present

## 2021-09-06 MED ORDER — DIPHENHYDRAMINE HCL 25 MG PO CAPS
25.0000 mg | ORAL_CAPSULE | Freq: Once | ORAL | Status: AC
  Administered 2021-09-06: 25 mg via ORAL
  Filled 2021-09-06: qty 1

## 2021-09-06 MED ORDER — ACETAMINOPHEN 325 MG PO TABS
650.0000 mg | ORAL_TABLET | Freq: Once | ORAL | Status: AC
  Administered 2021-09-06: 650 mg via ORAL
  Filled 2021-09-06: qty 2

## 2021-09-06 MED ORDER — HEPARIN SOD (PORK) LOCK FLUSH 100 UNIT/ML IV SOLN
500.0000 [IU] | Freq: Every day | INTRAVENOUS | Status: AC | PRN
  Administered 2021-09-06: 500 [IU]

## 2021-09-06 MED ORDER — SODIUM CHLORIDE 0.9% IV SOLUTION
250.0000 mL | Freq: Once | INTRAVENOUS | Status: AC
  Administered 2021-09-06: 250 mL via INTRAVENOUS

## 2021-09-06 MED ORDER — SODIUM CHLORIDE 0.9% FLUSH
10.0000 mL | INTRAVENOUS | Status: AC | PRN
  Administered 2021-09-06: 10 mL

## 2021-09-06 NOTE — Patient Instructions (Signed)
Blood Transfusion, Adult A blood transfusion is a procedure in which you receive blood or a type of blood cell (blood component) through an IV. You may need a blood transfusion when your blood level is low. This may result from a bleeding disorder, illness, injury, or surgery. The blood may come from a donor. You may also be able to donate blood for yourself (autologous blood donation) before a planned surgery. The blood given in a transfusion is made up of different blood components. You may receive: Red blood cells. These carry oxygen to the cells in the body. Platelets. These help your blood to clot. Plasma. This is the liquid part of your blood. It carries proteins and other substances throughout the body. White blood cells. These help you fight infections. If you have hemophilia or another clotting disorder, you may also receive other types of blood products. Tell a health care provider about: Any blood disorders you have. Any previous reactions you have had during a blood transfusion. Any allergies you have. All medicines you are taking, including vitamins, herbs, eye drops, creams, and over-the-counter medicines. Any surgeries you have had. Any medical conditions you have, including any recent fever or cold symptoms. Whether you are pregnant or may be pregnant. What are the risks? Generally, this is a safe procedure. However, problems may occur. The most common problems include: A mild allergic reaction, such as red, swollen areas of skin (hives) and itching. Fever or chills. This may be the body's response to new blood cells received. This may occur during or up to 4 hours after the transfusion. More serious problems may include: Transfusion-associated circulatory overload (TACO), or too much fluid in the lungs. This may cause breathing problems. A serious allergic reaction, such as difficulty breathing or swelling around the face and lips. Transfusion-related acute lung injury  (TRALI), which causes breathing difficulty and low oxygen in the blood. This can occur within hours of the transfusion or several days later. Iron overload. This can happen after receiving many blood transfusions over a period of time. Infection or virus being transmitted. This is rare because donated blood is carefully tested before it is given. Hemolytic transfusion reaction. This is rare. It happens when your body's defense system (immune system)tries to attack the new blood cells. Symptoms may include fever, chills, nausea, low blood pressure, and low back or chest pain. Transfusion-associated graft-versus-host disease (TAGVHD). This is rare. It happens when donated cells attack your body's healthy tissues. What happens before the procedure? Medicines Ask your health care provider about: Changing or stopping your regular medicines. This is especially important if you are taking diabetes medicines or blood thinners. Taking medicines such as aspirin and ibuprofen. These medicines can thin your blood. Do not take these medicines unless your health care provider tells you to take them. Taking over-the-counter medicines, vitamins, herbs, and supplements. General instructions Follow instructions from your health care provider about eating and drinking restrictions. You will have a blood test to determine your blood type. This is necessary to know what kind of blood your body will accept and to match it to the donor blood. If you are going to have a planned surgery, you may be able to do an autologous blood donation. This may be done in case you need to have a transfusion. You will have your temperature, blood pressure, and pulse monitored before the transfusion. If you have had an allergic reaction to a transfusion in the past, you may be given medicine to help prevent   a reaction. This medicine may be given to you by mouth (orally) or through an IV. Set aside time for the blood transfusion. This  procedure generally takes 1-4 hours to complete. What happens during the procedure?  An IV will be inserted into one of your veins. The bag of donated blood will be attached to your IV. The blood will then enter through your vein. Your temperature, blood pressure, and pulse will be monitored regularly during the transfusion. This monitoring is done to detect early signs of a transfusion reaction. Tell your nurse right away if you have any of these symptoms during the transfusion: Shortness of breath or trouble breathing. Chest or back pain. Fever or chills. Hives or itching. If you have any signs or symptoms of a reaction, your transfusion will be stopped and you may be given medicine. When the transfusion is complete, your IV will be removed. Pressure may be applied to the IV site for a few minutes. A bandage (dressing)will be applied. The procedure may vary among health care providers and hospitals. What happens after the procedure? Your temperature, blood pressure, pulse, breathing rate, and blood oxygen level will be monitored until you leave the hospital or clinic. Your blood may be tested to see how you are responding to the transfusion. You may be warmed with fluids or blankets to maintain a normal body temperature. If you receive your blood transfusion in an outpatient setting, you will be told whom to contact to report any reactions. Where to find more information For more information on blood transfusions, visit the [employer]: redcross.org Summary A blood transfusion is a procedure in which you receive blood or a type of blood cell (blood component) through an IV. The blood you receive may come from a donor or be donated by yourself (autologous blood donation) before a planned surgery. The blood given in a transfusion is made up of different blood components. You may receive red blood cells, platelets, plasma, or white blood cells depending on the condition treated. Your  temperature, blood pressure, and pulse will be monitored before, during, and after the transfusion. After the transfusion, your blood may be tested to see how your body has responded. This information is not intended to replace advice given to you by your health care provider. Make sure you discuss any questions you have with your health care provider. Document Revised: 02/27/2019 Document Reviewed: 10/17/2018 Elsevier Patient Education  2023 [employer]  

## 2021-09-07 ENCOUNTER — Other Ambulatory Visit (HOSPITAL_BASED_OUTPATIENT_CLINIC_OR_DEPARTMENT_OTHER): Payer: Self-pay

## 2021-09-07 LAB — BPAM RBC
Blood Product Expiration Date: 202305212359
Blood Product Expiration Date: 202305212359
ISSUE DATE / TIME: 202305011225
ISSUE DATE / TIME: 202305020805
Unit Type and Rh: 7300
Unit Type and Rh: 7300

## 2021-09-07 LAB — TYPE AND SCREEN
ABO/RH(D): B POS
Antibody Screen: NEGATIVE
Unit division: 0
Unit division: 0

## 2021-09-16 ENCOUNTER — Encounter: Payer: Self-pay | Admitting: *Deleted

## 2021-09-16 ENCOUNTER — Inpatient Hospital Stay: Payer: Medicare Other

## 2021-09-16 ENCOUNTER — Other Ambulatory Visit: Payer: Self-pay

## 2021-09-16 ENCOUNTER — Encounter: Payer: Self-pay | Admitting: Hematology & Oncology

## 2021-09-16 ENCOUNTER — Inpatient Hospital Stay (HOSPITAL_BASED_OUTPATIENT_CLINIC_OR_DEPARTMENT_OTHER): Payer: Medicare Other | Admitting: Hematology & Oncology

## 2021-09-16 VITALS — BP 98/74 | HR 73 | Temp 97.8°F | Resp 19 | Wt 167.0 lb

## 2021-09-16 VITALS — BP 108/69 | HR 80 | Temp 97.7°F | Resp 18

## 2021-09-16 DIAGNOSIS — Z5189 Encounter for other specified aftercare: Secondary | ICD-10-CM | POA: Diagnosis not present

## 2021-09-16 DIAGNOSIS — Z794 Long term (current) use of insulin: Secondary | ICD-10-CM | POA: Diagnosis not present

## 2021-09-16 DIAGNOSIS — C8338 Diffuse large B-cell lymphoma, lymph nodes of multiple sites: Secondary | ICD-10-CM

## 2021-09-16 DIAGNOSIS — C8330 Diffuse large B-cell lymphoma, unspecified site: Secondary | ICD-10-CM | POA: Diagnosis not present

## 2021-09-16 DIAGNOSIS — I714 Abdominal aortic aneurysm, without rupture, unspecified: Secondary | ICD-10-CM | POA: Diagnosis not present

## 2021-09-16 DIAGNOSIS — Z5112 Encounter for antineoplastic immunotherapy: Secondary | ICD-10-CM | POA: Diagnosis not present

## 2021-09-16 DIAGNOSIS — Z5111 Encounter for antineoplastic chemotherapy: Secondary | ICD-10-CM | POA: Diagnosis not present

## 2021-09-16 DIAGNOSIS — N390 Urinary tract infection, site not specified: Secondary | ICD-10-CM | POA: Diagnosis not present

## 2021-09-16 DIAGNOSIS — D649 Anemia, unspecified: Secondary | ICD-10-CM | POA: Diagnosis not present

## 2021-09-16 DIAGNOSIS — E1165 Type 2 diabetes mellitus with hyperglycemia: Secondary | ICD-10-CM | POA: Diagnosis not present

## 2021-09-16 LAB — URINALYSIS, COMPLETE (UACMP) WITH MICROSCOPIC
Bilirubin Urine: NEGATIVE
Glucose, UA: NEGATIVE mg/dL
Hgb urine dipstick: NEGATIVE
Ketones, ur: NEGATIVE mg/dL
Leukocytes,Ua: NEGATIVE
Nitrite: POSITIVE — AB
Protein, ur: NEGATIVE mg/dL
RBC / HPF: NONE SEEN RBC/hpf (ref 0–5)
Specific Gravity, Urine: <=1.005 (ref 1.005–1.030)
Squamous Epithelial / HPF: NONE SEEN (ref 0–5)
pH: 6 (ref 5.0–8.0)

## 2021-09-16 LAB — CBC WITH DIFFERENTIAL (CANCER CENTER ONLY)
Abs Immature Granulocytes: 0.05 10*3/uL (ref 0.00–0.07)
Basophils Absolute: 0.1 10*3/uL (ref 0.0–0.1)
Basophils Relative: 1 %
Eosinophils Absolute: 0 10*3/uL (ref 0.0–0.5)
Eosinophils Relative: 0 %
HCT: 33.2 % — ABNORMAL LOW (ref 39.0–52.0)
Hemoglobin: 11.3 g/dL — ABNORMAL LOW (ref 13.0–17.0)
Immature Granulocytes: 1 %
Lymphocytes Relative: 15 %
Lymphs Abs: 0.9 10*3/uL (ref 0.7–4.0)
MCH: 31.3 pg (ref 26.0–34.0)
MCHC: 34 g/dL (ref 30.0–36.0)
MCV: 92 fL (ref 80.0–100.0)
Monocytes Absolute: 1.2 10*3/uL — ABNORMAL HIGH (ref 0.1–1.0)
Monocytes Relative: 19 %
Neutro Abs: 4 10*3/uL (ref 1.7–7.7)
Neutrophils Relative %: 64 %
Platelet Count: 204 10*3/uL (ref 150–400)
RBC: 3.61 MIL/uL — ABNORMAL LOW (ref 4.22–5.81)
RDW: 17.1 % — ABNORMAL HIGH (ref 11.5–15.5)
WBC Count: 6.2 10*3/uL (ref 4.0–10.5)
nRBC: 0 % (ref 0.0–0.2)

## 2021-09-16 LAB — CMP (CANCER CENTER ONLY)
ALT: 32 U/L (ref 0–44)
AST: 26 U/L (ref 15–41)
Albumin: 3.9 g/dL (ref 3.5–5.0)
Alkaline Phosphatase: 63 U/L (ref 38–126)
Anion gap: 9 (ref 5–15)
BUN: 20 mg/dL (ref 8–23)
CO2: 24 mmol/L (ref 22–32)
Calcium: 9.3 mg/dL (ref 8.9–10.3)
Chloride: 102 mmol/L (ref 98–111)
Creatinine: 0.86 mg/dL (ref 0.61–1.24)
GFR, Estimated: >60 mL/min (ref >60)
Glucose, Bld: 246 mg/dL — ABNORMAL HIGH (ref 70–99)
Potassium: 4.4 mmol/L (ref 3.5–5.1)
Sodium: 135 mmol/L (ref 135–145)
Total Bilirubin: 0.5 mg/dL (ref 0.3–1.2)
Total Protein: 6.2 g/dL — ABNORMAL LOW (ref 6.5–8.1)

## 2021-09-16 LAB — LACTATE DEHYDROGENASE: LDH: 148 U/L (ref 98–192)

## 2021-09-16 MED ORDER — DOXORUBICIN HCL CHEMO IV INJECTION 2 MG/ML
50.0000 mg/m2 | Freq: Once | INTRAVENOUS | Status: AC
  Administered 2021-09-16: 106 mg via INTRAVENOUS
  Filled 2021-09-16: qty 53

## 2021-09-16 MED ORDER — SODIUM CHLORIDE 0.9 % IV SOLN: Freq: Once | INTRAVENOUS | Status: AC

## 2021-09-16 MED ORDER — SODIUM CHLORIDE 0.9 % IV SOLN
150.0000 mg | Freq: Once | INTRAVENOUS | Status: AC
  Administered 2021-09-16: 150 mg via INTRAVENOUS
  Filled 2021-09-16: qty 150

## 2021-09-16 MED ORDER — VINCRISTINE SULFATE CHEMO INJECTION 1 MG/ML
2.0000 mg | Freq: Once | INTRAVENOUS | Status: AC
  Administered 2021-09-16: 2 mg via INTRAVENOUS
  Filled 2021-09-16: qty 2

## 2021-09-16 MED ORDER — SODIUM CHLORIDE 0.9 % IV SOLN
750.0000 mg/m2 | Freq: Once | INTRAVENOUS | Status: AC
  Administered 2021-09-16: 1600 mg via INTRAVENOUS
  Filled 2021-09-16: qty 50

## 2021-09-16 MED ORDER — SODIUM CHLORIDE 0.9 % IV SOLN
10.0000 mg | Freq: Once | INTRAVENOUS | Status: AC
  Administered 2021-09-16: 10 mg via INTRAVENOUS
  Filled 2021-09-16: qty 10

## 2021-09-16 MED ORDER — HEPARIN SOD (PORK) LOCK FLUSH 100 UNIT/ML IV SOLN
500.0000 [IU] | Freq: Once | INTRAVENOUS | Status: AC | PRN
  Administered 2021-09-16: 500 [IU]

## 2021-09-16 MED ORDER — PALONOSETRON HCL INJECTION 0.25 MG/5ML
0.2500 mg | Freq: Once | INTRAVENOUS | Status: AC
  Administered 2021-09-16: 0.25 mg via INTRAVENOUS
  Filled 2021-09-16: qty 5

## 2021-09-16 MED ORDER — SODIUM CHLORIDE 0.9 % IV SOLN
375.0000 mg/m2 | Freq: Once | INTRAVENOUS | Status: AC
  Administered 2021-09-16: 800 mg via INTRAVENOUS
  Filled 2021-09-16: qty 50

## 2021-09-16 MED ORDER — SODIUM CHLORIDE 0.9% FLUSH
10.0000 mL | INTRAVENOUS | Status: DC | PRN
  Administered 2021-09-16: 10 mL

## 2021-09-16 MED ORDER — DIPHENHYDRAMINE HCL 25 MG PO CAPS
50.0000 mg | ORAL_CAPSULE | Freq: Once | ORAL | Status: AC
  Administered 2021-09-16: 50 mg via ORAL
  Filled 2021-09-16: qty 2

## 2021-09-16 MED ORDER — ACETAMINOPHEN 325 MG PO TABS
650.0000 mg | ORAL_TABLET | Freq: Once | ORAL | Status: AC
  Administered 2021-09-16: 650 mg via ORAL
  Filled 2021-09-16: qty 2

## 2021-09-16 NOTE — Progress Notes (Signed)
MD reviewed CBC and CMET, VO " ok to treat despite counts" ?

## 2021-09-16 NOTE — Progress Notes (Signed)
Patient is doing well today. He was able to be maintained outpatient after his last cycle and didn't have a repeat admission. He is showing signs of UTI which Dr Marin Olp will treat. ? ?Oncology Nurse Navigator Documentation ? ? ?  09/16/2021  ?  9:30 AM  ?Oncology Nurse Navigator Flowsheets  ?Navigator Follow Up Date: 10/07/2021  ?Navigator Follow Up Reason: Follow-up Appointment;Chemotherapy  ?Navigator Location [facility]  ?Navigator Encounter Type Treatment;Appt/Treatment Plan Review  ?Patient Visit Type MedOnc  ?Treatment Phase Active Tx  ?Barriers/Navigation Needs Coordination of Care;Education  ?Interventions Psycho-Social Support  ?Acuity Level 2-Minimal Needs (1-2 Barriers Identified)  ?Support Groups/Services Friends and Family  ?Time Spent with Patient 15  ?  ?

## 2021-09-16 NOTE — Progress Notes (Signed)
?Hematology and Oncology Follow Up Visit ? ?Aaron ARCHULETA Sr. ?[id number] ?Feb 13, 1956 65 y.o. ?09/16/2021 ? ? 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 ?Current Therapy:   ?R-CHOP --  s/p cycle #5-- started on 05/20/2021 ?    ?Interim History:  Aaron Weaver is back for for follow-up.  He is looking quite good right now.  We did have to transfuse him with blood about a week or so ago. ? ?He does have the indwelling Foley catheter and.  He does have Citrobacter in the urine. ? ?He feels good right now.  Blood sugars are on the high side.  They always seem to be on the high side.  He has had no nausea or vomiting.  He has had no diarrhea.  He has had no cough or shortness of breath.  There has been no mouth sores. ? ?He has had no leg swelling.  His left elbow is doing quite well.  He had a episode of cellulitis there and bursitis.  This was drained by orthopedic surgery a couple months ago. ? ?Overall, his performance status is ECOG 1.  Y ? ? ?Medications:  ?Current Outpatient Medications:  ?  acetaminophen (TYLENOL) 325 MG tablet, Take 650 mg by mouth every 6 (six) hours as needed for mild pain or headache., Disp: , Rfl:  ?  albuterol (PROAIR HFA) 108 (90 Base) MCG/ACT inhaler, Inhale 1-2 puffs into the lungs every 6 (six) hours as needed for wheezing or shortness of breath., Disp: 8 g, Rfl: 6 ?  alendronate (FOSAMAX) 70 MG tablet, Take 70 mg by mouth every Sunday., Disp: , Rfl:  ?  allopurinol (ZYLOPRIM) 100 MG tablet, Take 1 tablet (100 mg total) by mouth daily. Start 3 days BEFORE chemotherapy starts (Patient not taking: Reported on 08/13/2021), Disp: 30 tablet, Rfl: 0 ?  aspirin EC 81 MG EC tablet, Take 1 tablet (81 mg total) by mouth daily at 6 (six) AM. Swallow whole. (Patient taking differently: Take 81 mg by mouth in the morning. Swallow whole.), Disp: 30 tablet, Rfl: 11 ?  atenolol (TENORMIN) 100 MG tablet, Take 100 mg by mouth daily., Disp: , Rfl:  ?  blood glucose meter kit  and supplies KIT, Dispense based on patient and insurance preference. Use up to four times daily as directed., Disp: 1 each, Rfl: 0 ?  calcium carbonate (OSCAL) 1500 (600 Ca) MG TABS tablet, Take 600 mg of elemental calcium by mouth daily with breakfast., Disp: , Rfl:  ?  Continuous Blood Gluc Receiver (FREESTYLE LIBRE 2 READER) DEVI, , Disp: , Rfl:  ?  Continuous Blood Gluc Sensor (FREESTYLE LIBRE 14 DAY SENSOR) MISC, Inject 1 Device into the skin every 14 (fourteen) days., Disp: , Rfl:  ?  diclofenac Sodium (VOLTAREN) 1 % GEL, Apply 2 g topically daily as needed (to painful sites)., Disp: , Rfl:  ?  DULoxetine (CYMBALTA) 60 MG capsule, Take 60 mg by mouth at bedtime., Disp: , Rfl:  ?  famciclovir (FAMVIR) 250 MG tablet, Take 1 tablet (250 mg total) by mouth 2 (two) times daily., Disp: 30 tablet, Rfl: 8 ?  Fish Oil OIL, Take 1,500 mg by mouth daily., Disp: , Rfl:  ?  insulin glargine (LANTUS) 100 UNIT/ML Solostar Pen, Inject 25 Units into the skin daily., Disp: 15 mL, Rfl: 11 ?  insulin lispro (HUMALOG) 100 UNIT/ML KwikPen, Inject 2-15 Units into the skin 4 (four) times daily - after meals and at bedtime. Glucose  121 - 150: 2 units, Glucose 151 - 200: 3 units, Glucose 201 - 250: 5 units, Glucose 251 - 300: 8 units, Glucose 301 - 350: 11 units, Glucose 351 - 400: 15 units, Glucose > 400 call MD, Disp: 15 mL, Rfl: 11 ?  Insulin Pen Needle 32G X 4 MM MISC, 1 each by Does not apply route 4 (four) times daily - after meals and at bedtime., Disp: 100 each, Rfl: 5 ?  LORazepam (ATIVAN) 0.5 MG tablet, Take 1 tablet (0.5 mg total) by mouth every 8 (eight) hours. Take for nausea prn (Patient not taking: Reported on 07/18/2021), Disp: 30 tablet, Rfl: 1 ?  omeprazole (PRILOSEC) 40 MG capsule, Take 40 mg by mouth every evening., Disp: , Rfl:  ?  ondansetron (ZOFRAN) 8 MG tablet, Take 1 tablet (8 mg total) by mouth 2 (two) times daily as needed for refractory nausea / vomiting. Start on day 3 after cyclophosphamide chemotherapy.  (Patient taking differently: Take 8 mg by mouth 2 (two) times daily as needed for refractory nausea / vomiting.), Disp: 30 tablet, Rfl: 1 ?  potassium chloride SA (KLOR-CON M20) 20 MEQ tablet, TAKE 1 TABLET (20 MEQ TOTAL) BY MOUTH DAILY.  (Patient taking differently: Take 20 mEq by mouth in the morning.), Disp: 30 tablet, Rfl: 5 ?  predniSONE (DELTASONE) 20 MG tablet, Take 60 mg by mouth daily., Disp: , Rfl:  ?  prochlorperazine (COMPAZINE) 10 MG tablet, Take 1 tablet (10 mg total) by mouth every 6 (six) hours as needed (Nausea or vomiting). (Patient not taking: Reported on 07/18/2021), Disp: 30 tablet, Rfl: 6 ?  rosuvastatin (CRESTOR) 10 MG tablet, Take 10 mg by mouth daily., Disp: , Rfl:  ?  SEREVENT DISKUS 50 MCG/ACT diskus inhaler, Inhale 1 puff into the lungs 2 (two) times daily., Disp: , Rfl:  ?  sulfamethoxazole-trimethoprim (BACTRIM DS) 800-160 MG tablet, Take 1 tablet by mouth daily., Disp: 20 tablet, Rfl: 4 ?  tadalafil (CIALIS) 5 MG tablet, Take 5 mg by mouth in the morning., Disp: , Rfl:  ?  tamsulosin (FLOMAX) 0.4 MG CAPS capsule, Take 0.4 mg by mouth every evening., Disp: , Rfl:  ?  Zinc 50 MG TABS, Take 50 mg by mouth daily., Disp: , Rfl:  ? ?Allergies:  ?Allergies  ?Allergen Reactions  ? Fexofenadine Other (See Comments)  ?  "dries me out too much"  ? Hydrochlorothiazide Other (See Comments)  ?  Cramps  ? Penicillins Hives  ? Pravastatin Sodium Other (See Comments)  ?  Aches  ? Requip [Ropinirole Hcl] Other (See Comments)  ?  Bad dreams  ? ? ?Past Medical History, Surgical history, Social history, and Family History were reviewed and updated. ? ?Review of Systems: ?Review of Systems  ?Constitutional: Negative.   ?HENT:  Negative.    ?Eyes: Negative.   ?Respiratory: Negative.    ?Cardiovascular: Negative.   ?Gastrointestinal: Negative.   ?Endocrine: Negative.   ?Genitourinary: Negative.    ?Musculoskeletal: Negative.   ?Skin: Negative.   ?Neurological: Negative.   ?Hematological: Negative.    ?Psychiatric/Behavioral: Negative.    ? ?Physical Exam: ? weight is 167 lb (75.8 kg). His oral temperature is 97.8 ?F (36.6 ?C). His blood pressure is 98/74 and his pulse is 73. His respiration is 19 and oxygen saturation is 100%.  ? ?Wt Readings from Last 3 Encounters:  ?09/16/21 167 lb (75.8 kg)  ?08/26/21 167 lb (75.8 kg)  ?08/13/21 160 lb 15 oz (73 kg)  ?His vital signs show temperature  of 98.6.  Pulse 72.  Blood pressure 103/62.  Weight is 180 pounds. ? ?Physical Exam ?Vitals reviewed.  ?HENT:  ?   Head: Normocephalic and atraumatic.  ?Eyes:  ?   Pupils: Pupils are equal, round, and reactive to light.  ?Cardiovascular:  ?   Rate and Rhythm: Normal rate and regular rhythm.  ?   Heart sounds: Normal heart sounds.  ?Pulmonary:  ?   Effort: Pulmonary effort is normal.  ?   Breath sounds: Normal breath sounds.  ?Abdominal:  ?   General: Bowel sounds are normal.  ?   Palpations: Abdomen is soft.  ?Musculoskeletal:     ?   General: No tenderness or deformity. Normal range of motion.  ?   Cervical back: Normal range of motion.  ?   Comments: Evaluation of her left elbow does show some fluctuance.  There is some erythema.  There is slight tenderness to palpation.  There is no exudate from the eschar.  He has decent range of motion of the elbow.  ?Lymphadenopathy:  ?   Cervical: No cervical adenopathy.  ?Skin: ?   General: Skin is warm and dry.  ?   Findings: No erythema or rash.  ?Neurological:  ?   Mental Status: He is alert and oriented to person, place, and time.  ?Psychiatric:     ?   Behavior: Behavior normal.     ?   Thought Content: Thought content normal.     ?   Judgment: Judgment normal.  ? ? ? ?Lab Results  ?Component Value Date  ? WBC 6.2 09/16/2021  ? HGB 11.3 (L) 09/16/2021  ? HCT 33.2 (L) 09/16/2021  ? MCV 92.0 09/16/2021  ? PLT 204 09/16/2021  ? ?  Chemistry   ?   ?Component Value Date/Time  ? NA 128 (L) 09/05/2021 0930  ? K 4.3 09/05/2021 0930  ? CL 93 (L) 09/05/2021 0930  ? CO2 22 09/05/2021 0930  ?  BUN 31 (H) 09/05/2021 0930  ? CREATININE 0.96 09/05/2021 0930  ?    ?Component Value Date/Time  ? CALCIUM 9.5 09/05/2021 0930  ? ALKPHOS 63 09/05/2021 0930  ? AST 9 (L) 09/05/2021 0930  ? ALT 19 09/05/2021 0930

## 2021-09-16 NOTE — Patient Instructions (Signed)

## 2021-09-17 LAB — URINE CULTURE: Culture: NO GROWTH

## 2021-09-19 ENCOUNTER — Inpatient Hospital Stay: Payer: Medicare Other

## 2021-09-19 ENCOUNTER — Other Ambulatory Visit (HOSPITAL_BASED_OUTPATIENT_CLINIC_OR_DEPARTMENT_OTHER): Payer: Self-pay

## 2021-09-19 ENCOUNTER — Other Ambulatory Visit: Payer: Self-pay | Admitting: *Deleted

## 2021-09-19 VITALS — BP 108/77 | HR 80 | Temp 98.0°F | Resp 18

## 2021-09-19 DIAGNOSIS — C8338 Diffuse large B-cell lymphoma, lymph nodes of multiple sites: Secondary | ICD-10-CM

## 2021-09-19 DIAGNOSIS — I714 Abdominal aortic aneurysm, without rupture, unspecified: Secondary | ICD-10-CM | POA: Diagnosis not present

## 2021-09-19 DIAGNOSIS — D649 Anemia, unspecified: Secondary | ICD-10-CM | POA: Diagnosis not present

## 2021-09-19 DIAGNOSIS — E860 Dehydration: Secondary | ICD-10-CM

## 2021-09-19 DIAGNOSIS — R591 Generalized enlarged lymph nodes: Secondary | ICD-10-CM

## 2021-09-19 DIAGNOSIS — Z794 Long term (current) use of insulin: Secondary | ICD-10-CM | POA: Diagnosis not present

## 2021-09-19 DIAGNOSIS — Z5189 Encounter for other specified aftercare: Secondary | ICD-10-CM | POA: Diagnosis not present

## 2021-09-19 DIAGNOSIS — Z5111 Encounter for antineoplastic chemotherapy: Secondary | ICD-10-CM | POA: Diagnosis not present

## 2021-09-19 DIAGNOSIS — E1165 Type 2 diabetes mellitus with hyperglycemia: Secondary | ICD-10-CM | POA: Diagnosis not present

## 2021-09-19 DIAGNOSIS — Z95828 Presence of other vascular implants and grafts: Secondary | ICD-10-CM

## 2021-09-19 DIAGNOSIS — M25552 Pain in left hip: Secondary | ICD-10-CM

## 2021-09-19 DIAGNOSIS — S2249XS Multiple fractures of ribs, unspecified side, sequela: Secondary | ICD-10-CM

## 2021-09-19 DIAGNOSIS — C8330 Diffuse large B-cell lymphoma, unspecified site: Secondary | ICD-10-CM | POA: Diagnosis not present

## 2021-09-19 DIAGNOSIS — N390 Urinary tract infection, site not specified: Secondary | ICD-10-CM | POA: Diagnosis not present

## 2021-09-19 DIAGNOSIS — Z5112 Encounter for antineoplastic immunotherapy: Secondary | ICD-10-CM | POA: Diagnosis not present

## 2021-09-19 LAB — URINALYSIS, COMPLETE (UACMP) WITH MICROSCOPIC: WBC, UA: >50 WBC/hpf (ref 0–5)

## 2021-09-19 MED ORDER — SODIUM CHLORIDE 0.9% FLUSH
10.0000 mL | INTRAVENOUS | Status: DC | PRN
  Administered 2021-09-19: 10 mL via INTRAVENOUS

## 2021-09-19 MED ORDER — HEPARIN SOD (PORK) LOCK FLUSH 100 UNIT/ML IV SOLN
500.0000 [IU] | Freq: Once | INTRAVENOUS | Status: AC
  Administered 2021-09-19: 500 [IU] via INTRAVENOUS

## 2021-09-19 MED ORDER — SODIUM CHLORIDE 0.9 % IV SOLN: Freq: Once | INTRAVENOUS | Status: AC

## 2021-09-19 MED ORDER — PEGFILGRASTIM-BMEZ 6 MG/0.6ML ~~LOC~~ SOSY
6.0000 mg | PREFILLED_SYRINGE | Freq: Once | SUBCUTANEOUS | Status: AC
  Administered 2021-09-19: 6 mg via SUBCUTANEOUS
  Filled 2021-09-19: qty 0.6

## 2021-09-19 NOTE — Patient Instructions (Signed)
Implanted Port Insertion, Care After ?The following information offers guidance on how to care for yourself after your procedure. Your health care provider may also give you more specific instructions. If you have problems or questions, contact your health care provider. ?What can I expect after the procedure? ?After the procedure, it is common to have: ?Discomfort at the port insertion site. ?Bruising on the skin over the port. This should improve over 3-4 days. ?Follow these instructions at home: ?Port care ?After your port is placed, you will get a manufacturer's information card. The card has information about your port. Keep this card with you at all times. ?Take care of the port as told by your health care provider. Ask your health care provider if you or a family member can get training for taking care of the port at home. ?A home health care nurse will be be available to help care for the port. ?Make sure to remember what type of port you have. ?Incision care ? ?  ? ?Follow instructions from your health care provider about how to take care of your port insertion site. Make sure you: ?Wash your hands with soap and water for at least 20 seconds before and after you change your bandage (dressing). If soap and water are not available, use hand sanitizer. ?Change your dressing as told by your health care provider. ?Leave stitches (sutures), skin glue, or adhesive strips in place. These skin closures may need to stay in place for 2 weeks or longer. If adhesive strip edges start to loosen and curl up, you may trim the loose edges. Do not remove adhesive strips completely unless your health care provider tells you to do that. ?Check your port insertion site every day for signs of infection. Check for: ?Redness, swelling, or pain. ?Fluid or blood. ?Warmth. ?Pus or a bad smell. ?Activity ?Return to your normal activities as told by your health care provider. Ask your health care provider what activities are safe for  you. ?You may have to avoid lifting. Ask your health care provider how much you can safely lift. ?General instructions ?Take over-the-counter and prescription medicines only as told by your health care provider. ?Do not take baths, swim, or use a hot tub until your health care provider approves. Ask your health care provider if you may take showers. You may only be allowed to take sponge baths. ?If you were given a sedative during the procedure, it can affect you for several hours. Do not drive or operate machinery until your health care provider says that it is safe. ?Wear a medical alert bracelet in case of an emergency. This will tell any health care providers that you have a port. ?Keep all follow-up visits. This is important. ?Contact a health care provider if: ?You cannot flush your port with saline as directed, or you cannot draw blood from the port. ?You have a fever or chills. ?You have redness, swelling, or pain around your port insertion site. ?You have fluid or blood coming from your port insertion site. ?Your port insertion site feels warm to the touch. ?You have pus or a bad smell coming from the port insertion site. ?Get help right away if: ?You have chest pain or shortness of breath. ?You have bleeding from your port that you cannot control. ?These symptoms may be an emergency. Get help right away. Call 911. ?Do not wait to see if the symptoms will go away. ?Do not drive yourself to the hospital. ?Summary ?Take care of the  port as told by your health care provider. Keep the manufacturer's information card with you at all times. ?Change your dressing as told by your health care provider. ?Contact a health care provider if you have a fever or chills or if you have redness, swelling, or pain around your port insertion site. ?Keep all follow-up visits. ?This information is not intended to replace advice given to you by your health care provider. Make sure you discuss any questions you have with your  health care provider. ?Document Revised: 10/26/2020 Document Reviewed: 10/26/2020 ?Elsevier Patient Education ? [employer]. ?Dehydration, Adult ?Dehydration is condition in which there is not enough water or other fluids in the body. This happens when a person loses more fluids than he or she takes in. Important body parts cannot work right without the right amount of fluids. Any loss of fluids from the body can cause dehydration. ?Dehydration can be mild, worse, or very bad. It should be treated right away to keep it from getting very bad. ?What are the causes? ?This condition may be caused by: ?Conditions that cause loss of water or other fluids, such as: ?Watery poop (diarrhea). ?Vomiting. ?Sweating a lot. ?Peeing (urinating) a lot. ?Not drinking enough fluids, especially when you: ?Are ill. ?Are doing things that take a lot of energy to do. ?Other illnesses and conditions, such as fever or infection. ?Certain medicines, such as medicines that take extra fluid out of the body (diuretics). ?Lack of safe drinking water. ?Not being able to get enough water and food. ?What increases the risk? ?The following factors may make you more likely to develop this condition: ?Having a long-term (chronic) illness that has not been treated the right way, such as: ?Diabetes. ?Heart disease. ?Kidney disease. ?Being 55 years of age or older. ?Having a disability. ?Living in a place that is high above the ground or sea (high in altitude). The thinner, dried air causes more fluid loss. ?Doing exercises that put stress on your body for a long time. ?What are the signs or symptoms? ?Symptoms of dehydration depend on how bad it is. ?Mild or worse dehydration ?Thirst. ?Dry lips or dry mouth. ?Feeling dizzy or light-headed, especially when you stand up from sitting. ?Muscle cramps. ?Your body making: ?Dark pee (urine). Pee may be the color of tea. ?Less pee than normal. ?Less tears than normal. ?Headache. ?Very bad  dehydration ?Changes in skin. Skin may: ?Be cold to the touch (clammy). ?Be blotchy or pale. ?Not go back to normal right after you lightly pinch it and let it go. ?Little or no tears, pee, or sweat. ?Changes in vital signs, such as: ?Fast breathing. ?Low blood pressure. ?Weak pulse. ?Pulse that is more than 100 beats a minute when you are sitting still. ?Other changes, such as: ?Feeling very thirsty. ?Eyes that look hollow (sunken). ?Cold hands and feet. ?Being mixed up (confused). ?Being very tired (lethargic) or having trouble waking from sleep. ?Short-term weight loss. ?Loss of consciousness. ?How is this treated? ?Treatment for this condition depends on how bad it is. Treatment should start right away. Do not wait until your condition gets very bad. Very bad dehydration is an emergency. You will need to go to a hospital. ?Mild or worse dehydration can be treated at home. You may be asked to: ?Drink more fluids. ?Drink an oral rehydration solution (ORS). This drink helps get the right amounts of fluids and salts and minerals in the blood (electrolytes). ?Very bad dehydration can be treated: ?With fluids through  an IV tube. ?By getting normal levels of salts and minerals in your blood. This is often done by giving salts and minerals through a tube. The tube is passed through your nose and into your stomach. ?By treating the root cause. ?Follow these instructions at home: ?Oral rehydration solution ?If told by your doctor, drink an ORS: ?Make an ORS. Use instructions on the package. ?Start by drinking small amounts, about ? cup (120 mL) every 5-10 minutes. ?Slowly drink more until you have had the amount that your doctor said to have. ?Eating and drinking ? ?  ? ?  ? ?Drink enough clear fluid to keep your pee pale yellow. If you were told to drink an ORS, finish the ORS first. Then, start slowly drinking other clear fluids. Drink fluids such as: ?Water. Do not drink only water. Doing that can make the salt  (sodium) level in your body get too low. ?Water from ice chips you suck on. ?Fruit juice that you have added water to (diluted). ?Low-calorie sports drinks. ?Eat foods that have the right amounts of salts and minerals, such as:

## 2021-09-19 NOTE — Progress Notes (Signed)
UA/ Culture to be repeated per MD ?

## 2021-09-20 ENCOUNTER — Encounter: Payer: Self-pay | Admitting: Hematology & Oncology

## 2021-09-21 ENCOUNTER — Encounter: Payer: Self-pay | Admitting: *Deleted

## 2021-09-21 ENCOUNTER — Other Ambulatory Visit: Payer: Self-pay | Admitting: *Deleted

## 2021-09-21 DIAGNOSIS — R338 Other retention of urine: Secondary | ICD-10-CM | POA: Diagnosis not present

## 2021-09-21 DIAGNOSIS — N390 Urinary tract infection, site not specified: Secondary | ICD-10-CM

## 2021-09-21 LAB — URINE CULTURE: Culture: >=100000 — AB

## 2021-09-21 MED ORDER — NITROFURANTOIN MONOHYD MACRO 100 MG PO CAPS: 100.0000 mg | ORAL_CAPSULE | Freq: Two times a day (BID) | ORAL | 0 refills | Status: DC

## 2021-09-23 ENCOUNTER — Inpatient Hospital Stay: Payer: Medicare Other

## 2021-09-23 ENCOUNTER — Other Ambulatory Visit: Payer: Self-pay | Admitting: *Deleted

## 2021-09-23 ENCOUNTER — Encounter: Payer: Self-pay | Admitting: *Deleted

## 2021-09-23 VITALS — BP 105/64 | HR 76 | Temp 98.6°F | Resp 17

## 2021-09-23 DIAGNOSIS — D649 Anemia, unspecified: Secondary | ICD-10-CM

## 2021-09-23 DIAGNOSIS — C8330 Diffuse large B-cell lymphoma, unspecified site: Secondary | ICD-10-CM | POA: Diagnosis not present

## 2021-09-23 DIAGNOSIS — C8338 Diffuse large B-cell lymphoma, lymph nodes of multiple sites: Secondary | ICD-10-CM

## 2021-09-23 DIAGNOSIS — Z5111 Encounter for antineoplastic chemotherapy: Secondary | ICD-10-CM | POA: Diagnosis not present

## 2021-09-23 DIAGNOSIS — M25552 Pain in left hip: Secondary | ICD-10-CM

## 2021-09-23 DIAGNOSIS — N390 Urinary tract infection, site not specified: Secondary | ICD-10-CM | POA: Diagnosis not present

## 2021-09-23 DIAGNOSIS — Z5112 Encounter for antineoplastic immunotherapy: Secondary | ICD-10-CM | POA: Diagnosis not present

## 2021-09-23 DIAGNOSIS — I714 Abdominal aortic aneurysm, without rupture, unspecified: Secondary | ICD-10-CM | POA: Diagnosis not present

## 2021-09-23 DIAGNOSIS — S2249XS Multiple fractures of ribs, unspecified side, sequela: Secondary | ICD-10-CM

## 2021-09-23 DIAGNOSIS — E860 Dehydration: Secondary | ICD-10-CM

## 2021-09-23 DIAGNOSIS — E1165 Type 2 diabetes mellitus with hyperglycemia: Secondary | ICD-10-CM | POA: Diagnosis not present

## 2021-09-23 DIAGNOSIS — Z5189 Encounter for other specified aftercare: Secondary | ICD-10-CM | POA: Diagnosis not present

## 2021-09-23 DIAGNOSIS — Z794 Long term (current) use of insulin: Secondary | ICD-10-CM | POA: Diagnosis not present

## 2021-09-23 LAB — CBC WITH DIFFERENTIAL (CANCER CENTER ONLY)
Abs Immature Granulocytes: 0 10*3/uL (ref 0.00–0.07)
Band Neutrophils: 1 %
Basophils Absolute: 0 10*3/uL (ref 0.0–0.1)
Basophils Relative: 1 %
Eosinophils Absolute: 0 10*3/uL (ref 0.0–0.5)
Eosinophils Relative: 0 %
HCT: 26.8 % — ABNORMAL LOW (ref 39.0–52.0)
Hemoglobin: 9.1 g/dL — ABNORMAL LOW (ref 13.0–17.0)
Lymphocytes Relative: 10 %
Lymphs Abs: 0.2 10*3/uL — ABNORMAL LOW (ref 0.7–4.0)
MCH: 30.7 pg (ref 26.0–34.0)
MCHC: 34 g/dL (ref 30.0–36.0)
MCV: 90.5 fL (ref 80.0–100.0)
Monocytes Absolute: 0 10*3/uL — ABNORMAL LOW (ref 0.1–1.0)
Monocytes Relative: 0 %
Neutro Abs: 2 10*3/uL (ref 1.7–7.7)
Neutrophils Relative %: 88 %
Platelet Count: 59 10*3/uL — ABNORMAL LOW (ref 150–400)
RBC: 2.96 MIL/uL — ABNORMAL LOW (ref 4.22–5.81)
RDW: 15.6 % — ABNORMAL HIGH (ref 11.5–15.5)
Smear Review: NORMAL
WBC Count: 2.2 10*3/uL — ABNORMAL LOW (ref 4.0–10.5)
nRBC: 0 % (ref 0.0–0.2)

## 2021-09-23 LAB — CMP (CANCER CENTER ONLY)
ALT: 24 U/L (ref 0–44)
AST: 11 U/L — ABNORMAL LOW (ref 15–41)
Albumin: 3.7 g/dL (ref 3.5–5.0)
Alkaline Phosphatase: 57 U/L (ref 38–126)
Anion gap: 7 (ref 5–15)
BUN: 25 mg/dL — ABNORMAL HIGH (ref 8–23)
CO2: 28 mmol/L (ref 22–32)
Calcium: 9 mg/dL (ref 8.9–10.3)
Chloride: 99 mmol/L (ref 98–111)
Creatinine: 0.79 mg/dL (ref 0.61–1.24)
GFR, Estimated: >60 mL/min (ref >60)
Glucose, Bld: 277 mg/dL — ABNORMAL HIGH (ref 70–99)
Potassium: 4.3 mmol/L (ref 3.5–5.1)
Sodium: 134 mmol/L — ABNORMAL LOW (ref 135–145)
Total Bilirubin: 1 mg/dL (ref 0.3–1.2)
Total Protein: 5.9 g/dL — ABNORMAL LOW (ref 6.5–8.1)

## 2021-09-23 LAB — PREPARE RBC (CROSSMATCH)

## 2021-09-23 MED ORDER — SODIUM CHLORIDE 0.9% FLUSH
10.0000 mL | INTRAVENOUS | Status: AC | PRN
  Administered 2021-09-23: 10 mL

## 2021-09-23 MED ORDER — DIPHENHYDRAMINE HCL 25 MG PO CAPS
25.0000 mg | ORAL_CAPSULE | Freq: Once | ORAL | Status: AC
  Administered 2021-09-23: 25 mg via ORAL
  Filled 2021-09-23: qty 1

## 2021-09-23 MED ORDER — HEPARIN SOD (PORK) LOCK FLUSH 100 UNIT/ML IV SOLN
500.0000 [IU] | Freq: Once | INTRAVENOUS | Status: AC | PRN
  Administered 2021-09-23: 500 [IU]

## 2021-09-23 MED ORDER — SODIUM CHLORIDE 0.9 % IV SOLN: Freq: Once | INTRAVENOUS | Status: AC

## 2021-09-23 MED ORDER — ACETAMINOPHEN 325 MG PO TABS
650.0000 mg | ORAL_TABLET | Freq: Once | ORAL | Status: AC
  Administered 2021-09-23: 650 mg via ORAL
  Filled 2021-09-23: qty 2

## 2021-09-23 MED ORDER — HEPARIN SOD (PORK) LOCK FLUSH 100 UNIT/ML IV SOLN: 500.0000 [IU] | Freq: Every day | INTRAVENOUS | Status: DC | PRN

## 2021-09-23 MED ORDER — SODIUM CHLORIDE 0.9% FLUSH: 10.0000 mL | Freq: Once | INTRAVENOUS | Status: DC | PRN

## 2021-09-23 NOTE — Patient Instructions (Signed)

## 2021-09-23 NOTE — Patient Instructions (Signed)

## 2021-09-25 LAB — BPAM RBC
Blood Product Expiration Date: 202306062359
ISSUE DATE / TIME: 202305191237
Unit Type and Rh: 7300

## 2021-09-25 LAB — TYPE AND SCREEN
ABO/RH(D): B POS
Antibody Screen: NEGATIVE
Unit division: 0

## 2021-09-26 ENCOUNTER — Other Ambulatory Visit: Payer: Self-pay

## 2021-09-26 ENCOUNTER — Inpatient Hospital Stay: Payer: Medicare Other

## 2021-09-26 VITALS — BP 96/64 | HR 78

## 2021-09-26 DIAGNOSIS — D649 Anemia, unspecified: Secondary | ICD-10-CM | POA: Diagnosis not present

## 2021-09-26 DIAGNOSIS — Z5112 Encounter for antineoplastic immunotherapy: Secondary | ICD-10-CM | POA: Diagnosis not present

## 2021-09-26 DIAGNOSIS — E1165 Type 2 diabetes mellitus with hyperglycemia: Secondary | ICD-10-CM | POA: Diagnosis not present

## 2021-09-26 DIAGNOSIS — C8338 Diffuse large B-cell lymphoma, lymph nodes of multiple sites: Secondary | ICD-10-CM

## 2021-09-26 DIAGNOSIS — I714 Abdominal aortic aneurysm, without rupture, unspecified: Secondary | ICD-10-CM | POA: Diagnosis not present

## 2021-09-26 DIAGNOSIS — M25552 Pain in left hip: Secondary | ICD-10-CM

## 2021-09-26 DIAGNOSIS — N390 Urinary tract infection, site not specified: Secondary | ICD-10-CM | POA: Diagnosis not present

## 2021-09-26 DIAGNOSIS — Z5189 Encounter for other specified aftercare: Secondary | ICD-10-CM | POA: Diagnosis not present

## 2021-09-26 DIAGNOSIS — Z794 Long term (current) use of insulin: Secondary | ICD-10-CM | POA: Diagnosis not present

## 2021-09-26 DIAGNOSIS — S2249XS Multiple fractures of ribs, unspecified side, sequela: Secondary | ICD-10-CM

## 2021-09-26 DIAGNOSIS — Z5111 Encounter for antineoplastic chemotherapy: Secondary | ICD-10-CM | POA: Diagnosis not present

## 2021-09-26 DIAGNOSIS — C8330 Diffuse large B-cell lymphoma, unspecified site: Secondary | ICD-10-CM | POA: Diagnosis not present

## 2021-09-26 LAB — CBC WITH DIFFERENTIAL (CANCER CENTER ONLY)
Abs Immature Granulocytes: 0.14 10*3/uL — ABNORMAL HIGH (ref 0.00–0.07)
Basophils Absolute: 0.1 10*3/uL (ref 0.0–0.1)
Basophils Relative: 2 %
Eosinophils Absolute: 0 10*3/uL (ref 0.0–0.5)
Eosinophils Relative: 0 %
HCT: 28.4 % — ABNORMAL LOW (ref 39.0–52.0)
Hemoglobin: 9.8 g/dL — ABNORMAL LOW (ref 13.0–17.0)
Immature Granulocytes: 6 %
Lymphocytes Relative: 12 %
Lymphs Abs: 0.3 10*3/uL — ABNORMAL LOW (ref 0.7–4.0)
MCH: 29.9 pg (ref 26.0–34.0)
MCHC: 34.5 g/dL (ref 30.0–36.0)
MCV: 86.6 fL (ref 80.0–100.0)
Monocytes Absolute: 0.5 10*3/uL (ref 0.1–1.0)
Monocytes Relative: 19 %
Neutro Abs: 1.5 10*3/uL — ABNORMAL LOW (ref 1.7–7.7)
Neutrophils Relative %: 61 %
Platelet Count: 26 10*3/uL — ABNORMAL LOW (ref 150–400)
RBC: 3.28 MIL/uL — ABNORMAL LOW (ref 4.22–5.81)
RDW: 15.1 % (ref 11.5–15.5)
Smear Review: NORMAL
WBC Count: 2.5 10*3/uL — ABNORMAL LOW (ref 4.0–10.5)
nRBC: 0 % (ref 0.0–0.2)

## 2021-09-26 LAB — CMP (CANCER CENTER ONLY)
ALT: 14 U/L (ref 0–44)
AST: 11 U/L — ABNORMAL LOW (ref 15–41)
Albumin: 3.9 g/dL (ref 3.5–5.0)
Alkaline Phosphatase: 58 U/L (ref 38–126)
Anion gap: 10 (ref 5–15)
BUN: 23 mg/dL (ref 8–23)
CO2: 23 mmol/L (ref 22–32)
Calcium: 9.2 mg/dL (ref 8.9–10.3)
Chloride: 96 mmol/L — ABNORMAL LOW (ref 98–111)
Creatinine: 0.78 mg/dL (ref 0.61–1.24)
GFR, Estimated: >60 mL/min (ref >60)
Glucose, Bld: 318 mg/dL — ABNORMAL HIGH (ref 70–99)
Potassium: 4.2 mmol/L (ref 3.5–5.1)
Sodium: 129 mmol/L — ABNORMAL LOW (ref 135–145)
Total Bilirubin: 0.8 mg/dL (ref 0.3–1.2)
Total Protein: 6.7 g/dL (ref 6.5–8.1)

## 2021-09-26 LAB — SAMPLE TO BLOOD BANK

## 2021-09-26 MED ORDER — SODIUM CHLORIDE 0.9 % IV SOLN: Freq: Once | INTRAVENOUS | Status: AC

## 2021-09-26 MED ORDER — SODIUM CHLORIDE 0.9% FLUSH
10.0000 mL | Freq: Once | INTRAVENOUS | Status: AC | PRN
  Administered 2021-09-26: 10 mL

## 2021-09-26 MED ORDER — HEPARIN SOD (PORK) LOCK FLUSH 100 UNIT/ML IV SOLN
500.0000 [IU] | Freq: Once | INTRAVENOUS | Status: AC | PRN
  Administered 2021-09-26: 500 [IU]

## 2021-09-26 NOTE — Patient Instructions (Signed)

## 2021-09-26 NOTE — Patient Instructions (Signed)

## 2021-09-28 DIAGNOSIS — M8580 Other specified disorders of bone density and structure, unspecified site: Secondary | ICD-10-CM | POA: Diagnosis not present

## 2021-09-28 DIAGNOSIS — K219 Gastro-esophageal reflux disease without esophagitis: Secondary | ICD-10-CM | POA: Diagnosis not present

## 2021-09-28 DIAGNOSIS — E1169 Type 2 diabetes mellitus with other specified complication: Secondary | ICD-10-CM | POA: Diagnosis not present

## 2021-09-28 DIAGNOSIS — E7800 Pure hypercholesterolemia, unspecified: Secondary | ICD-10-CM | POA: Diagnosis not present

## 2021-09-28 DIAGNOSIS — I10 Essential (primary) hypertension: Secondary | ICD-10-CM | POA: Diagnosis not present

## 2021-09-29 ENCOUNTER — Inpatient Hospital Stay: Payer: Medicare Other

## 2021-09-29 VITALS — BP 118/80 | HR 95 | Temp 97.5°F | Resp 18

## 2021-09-29 DIAGNOSIS — N390 Urinary tract infection, site not specified: Secondary | ICD-10-CM | POA: Diagnosis not present

## 2021-09-29 DIAGNOSIS — Z5112 Encounter for antineoplastic immunotherapy: Secondary | ICD-10-CM | POA: Diagnosis not present

## 2021-09-29 DIAGNOSIS — Z5189 Encounter for other specified aftercare: Secondary | ICD-10-CM | POA: Diagnosis not present

## 2021-09-29 DIAGNOSIS — Z5111 Encounter for antineoplastic chemotherapy: Secondary | ICD-10-CM | POA: Diagnosis not present

## 2021-09-29 DIAGNOSIS — M25552 Pain in left hip: Secondary | ICD-10-CM

## 2021-09-29 DIAGNOSIS — C8330 Diffuse large B-cell lymphoma, unspecified site: Secondary | ICD-10-CM | POA: Diagnosis not present

## 2021-09-29 DIAGNOSIS — D649 Anemia, unspecified: Secondary | ICD-10-CM | POA: Diagnosis not present

## 2021-09-29 DIAGNOSIS — S2249XS Multiple fractures of ribs, unspecified side, sequela: Secondary | ICD-10-CM

## 2021-09-29 DIAGNOSIS — I714 Abdominal aortic aneurysm, without rupture, unspecified: Secondary | ICD-10-CM | POA: Diagnosis not present

## 2021-09-29 DIAGNOSIS — Z794 Long term (current) use of insulin: Secondary | ICD-10-CM | POA: Diagnosis not present

## 2021-09-29 DIAGNOSIS — C8338 Diffuse large B-cell lymphoma, lymph nodes of multiple sites: Secondary | ICD-10-CM

## 2021-09-29 DIAGNOSIS — E1165 Type 2 diabetes mellitus with hyperglycemia: Secondary | ICD-10-CM | POA: Diagnosis not present

## 2021-09-29 MED ORDER — SODIUM CHLORIDE 0.9% FLUSH
10.0000 mL | Freq: Once | INTRAVENOUS | Status: AC | PRN
  Administered 2021-09-29: 10 mL

## 2021-09-29 MED ORDER — HEPARIN SOD (PORK) LOCK FLUSH 100 UNIT/ML IV SOLN
500.0000 [IU] | Freq: Once | INTRAVENOUS | Status: AC | PRN
  Administered 2021-09-29: 500 [IU]

## 2021-09-29 MED ORDER — SODIUM CHLORIDE 0.9 % IV SOLN: Freq: Once | INTRAVENOUS | Status: AC

## 2021-09-29 NOTE — Patient Instructions (Signed)

## 2021-09-30 ENCOUNTER — Ambulatory Visit: Payer: Medicare Other

## 2021-09-30 ENCOUNTER — Inpatient Hospital Stay: Payer: Medicare Other

## 2021-10-05 ENCOUNTER — Other Ambulatory Visit (HOSPITAL_BASED_OUTPATIENT_CLINIC_OR_DEPARTMENT_OTHER): Payer: Self-pay

## 2021-10-07 ENCOUNTER — Inpatient Hospital Stay: Payer: Medicare Other

## 2021-10-07 ENCOUNTER — Inpatient Hospital Stay: Payer: Medicare Other | Attending: Physician Assistant

## 2021-10-07 ENCOUNTER — Encounter: Payer: Self-pay | Admitting: *Deleted

## 2021-10-07 ENCOUNTER — Inpatient Hospital Stay (HOSPITAL_BASED_OUTPATIENT_CLINIC_OR_DEPARTMENT_OTHER): Payer: Medicare Other | Admitting: Hematology & Oncology

## 2021-10-07 VITALS — BP 123/72 | HR 115 | Resp 17

## 2021-10-07 VITALS — BP 130/92 | HR 124 | Temp 98.2°F | Resp 17 | Ht 72.0 in | Wt 166.4 lb

## 2021-10-07 DIAGNOSIS — Z794 Long term (current) use of insulin: Secondary | ICD-10-CM | POA: Diagnosis not present

## 2021-10-07 DIAGNOSIS — E1165 Type 2 diabetes mellitus with hyperglycemia: Secondary | ICD-10-CM | POA: Insufficient documentation

## 2021-10-07 DIAGNOSIS — C8338 Diffuse large B-cell lymphoma, lymph nodes of multiple sites: Secondary | ICD-10-CM

## 2021-10-07 DIAGNOSIS — Z5111 Encounter for antineoplastic chemotherapy: Secondary | ICD-10-CM | POA: Insufficient documentation

## 2021-10-07 DIAGNOSIS — Z5189 Encounter for other specified aftercare: Secondary | ICD-10-CM | POA: Insufficient documentation

## 2021-10-07 DIAGNOSIS — C8330 Diffuse large B-cell lymphoma, unspecified site: Secondary | ICD-10-CM | POA: Insufficient documentation

## 2021-10-07 DIAGNOSIS — Z5112 Encounter for antineoplastic immunotherapy: Secondary | ICD-10-CM | POA: Diagnosis not present

## 2021-10-07 DIAGNOSIS — I714 Abdominal aortic aneurysm, without rupture, unspecified: Secondary | ICD-10-CM | POA: Insufficient documentation

## 2021-10-07 LAB — CBC WITH DIFFERENTIAL (CANCER CENTER ONLY)
Abs Immature Granulocytes: 0.04 10*3/uL (ref 0.00–0.07)
Basophils Absolute: 0 10*3/uL (ref 0.0–0.1)
Basophils Relative: 1 %
Eosinophils Absolute: 0 10*3/uL (ref 0.0–0.5)
Eosinophils Relative: 0 %
HCT: 29.1 % — ABNORMAL LOW (ref 39.0–52.0)
Hemoglobin: 9.7 g/dL — ABNORMAL LOW (ref 13.0–17.0)
Immature Granulocytes: 1 %
Lymphocytes Relative: 15 %
Lymphs Abs: 0.9 10*3/uL (ref 0.7–4.0)
MCH: 31 pg (ref 26.0–34.0)
MCHC: 33.3 g/dL (ref 30.0–36.0)
MCV: 93 fL (ref 80.0–100.0)
Monocytes Absolute: 1 10*3/uL (ref 0.1–1.0)
Monocytes Relative: 17 %
Neutro Abs: 3.9 10*3/uL (ref 1.7–7.7)
Neutrophils Relative %: 66 %
Platelet Count: 142 10*3/uL — ABNORMAL LOW (ref 150–400)
RBC: 3.13 MIL/uL — ABNORMAL LOW (ref 4.22–5.81)
RDW: 20.1 % — ABNORMAL HIGH (ref 11.5–15.5)
WBC Count: 5.8 10*3/uL (ref 4.0–10.5)
nRBC: 0 % (ref 0.0–0.2)

## 2021-10-07 LAB — SAMPLE TO BLOOD BANK

## 2021-10-07 LAB — CMP (CANCER CENTER ONLY)
ALT: 22 U/L (ref 0–44)
AST: 20 U/L (ref 15–41)
Albumin: 3.9 g/dL (ref 3.5–5.0)
Alkaline Phosphatase: 57 U/L (ref 38–126)
Anion gap: 10 (ref 5–15)
BUN: 18 mg/dL (ref 8–23)
CO2: 24 mmol/L (ref 22–32)
Calcium: 9.1 mg/dL (ref 8.9–10.3)
Chloride: 100 mmol/L (ref 98–111)
Creatinine: 0.72 mg/dL (ref 0.61–1.24)
GFR, Estimated: >60 mL/min (ref >60)
Glucose, Bld: 334 mg/dL — ABNORMAL HIGH (ref 70–99)
Potassium: 4 mmol/L (ref 3.5–5.1)
Sodium: 134 mmol/L — ABNORMAL LOW (ref 135–145)
Total Bilirubin: 0.5 mg/dL (ref 0.3–1.2)
Total Protein: 6.2 g/dL — ABNORMAL LOW (ref 6.5–8.1)

## 2021-10-07 LAB — LACTATE DEHYDROGENASE: LDH: 145 U/L (ref 98–192)

## 2021-10-07 MED ORDER — SODIUM CHLORIDE 0.9% FLUSH
10.0000 mL | INTRAVENOUS | Status: DC | PRN
  Administered 2021-10-07: 10 mL

## 2021-10-07 MED ORDER — SODIUM CHLORIDE 0.9 % IV SOLN: Freq: Once | INTRAVENOUS | Status: AC

## 2021-10-07 MED ORDER — HEPARIN SOD (PORK) LOCK FLUSH 100 UNIT/ML IV SOLN
500.0000 [IU] | Freq: Once | INTRAVENOUS | Status: AC | PRN
  Administered 2021-10-07: 500 [IU]

## 2021-10-07 MED ORDER — SODIUM CHLORIDE 0.9 % IV SOLN
375.0000 mg/m2 | Freq: Once | INTRAVENOUS | Status: AC
  Administered 2021-10-07: 800 mg via INTRAVENOUS
  Filled 2021-10-07: qty 50

## 2021-10-07 MED ORDER — VINCRISTINE SULFATE CHEMO INJECTION 1 MG/ML
2.0000 mg | Freq: Once | INTRAVENOUS | Status: AC
  Administered 2021-10-07: 2 mg via INTRAVENOUS
  Filled 2021-10-07: qty 2

## 2021-10-07 MED ORDER — PALONOSETRON HCL INJECTION 0.25 MG/5ML
0.2500 mg | Freq: Once | INTRAVENOUS | Status: AC
  Administered 2021-10-07: 0.25 mg via INTRAVENOUS
  Filled 2021-10-07: qty 5

## 2021-10-07 MED ORDER — ACETAMINOPHEN 325 MG PO TABS
650.0000 mg | ORAL_TABLET | Freq: Once | ORAL | Status: AC
  Administered 2021-10-07: 650 mg via ORAL
  Filled 2021-10-07: qty 2

## 2021-10-07 MED ORDER — DIPHENHYDRAMINE HCL 25 MG PO CAPS
50.0000 mg | ORAL_CAPSULE | Freq: Once | ORAL | Status: AC
  Administered 2021-10-07: 50 mg via ORAL
  Filled 2021-10-07: qty 2

## 2021-10-07 MED ORDER — SODIUM CHLORIDE 0.9 % IV SOLN
750.0000 mg/m2 | Freq: Once | INTRAVENOUS | Status: AC
  Administered 2021-10-07: 1600 mg via INTRAVENOUS
  Filled 2021-10-07: qty 80

## 2021-10-07 MED ORDER — DOXORUBICIN HCL CHEMO IV INJECTION 2 MG/ML
50.0000 mg/m2 | Freq: Once | INTRAVENOUS | Status: AC
  Administered 2021-10-07: 106 mg via INTRAVENOUS
  Filled 2021-10-07: qty 53

## 2021-10-07 MED ORDER — SODIUM CHLORIDE 0.9 % IV SOLN
10.0000 mg | Freq: Once | INTRAVENOUS | Status: AC
  Administered 2021-10-07: 10 mg via INTRAVENOUS
  Filled 2021-10-07: qty 10

## 2021-10-07 MED ORDER — SODIUM CHLORIDE 0.9 % IV SOLN
150.0000 mg | Freq: Once | INTRAVENOUS | Status: AC
  Administered 2021-10-07: 150 mg via INTRAVENOUS
  Filled 2021-10-07: qty 150

## 2021-10-07 NOTE — Progress Notes (Signed)
Ok to proceed with treatment today in spite of Heart rate of 124 per D rEnnever

## 2021-10-07 NOTE — Progress Notes (Signed)
Patient will proceed with cycle seven. He will need scans after his eighth cycle.   Oncology Nurse Navigator Documentation     10/07/2021    9:00 AM  Oncology Nurse Navigator Flowsheets  Navigator Follow Up Date: 10/28/2021  Navigator Follow Up Reason: Follow-up Appointment;Chemotherapy  Navigator Location [facility]  Navigator Encounter Type Treatment;Appt/Treatment Plan Review  Patient Visit Type MedOnc  Treatment Phase Active Tx  Barriers/Navigation Needs Coordination of Care;Education  Interventions Psycho-Social Support  Acuity Level 2-Minimal Needs (1-2 Barriers Identified)  Support Groups/Services Friends and Family  Time Spent with Patient 15

## 2021-10-07 NOTE — Patient Instructions (Signed)

## 2021-10-07 NOTE — Patient Instructions (Signed)
[facility]  Discharge Instructions: Thank you for choosing [facility] to provide your oncology and hematology care.   If you have a lab appointment with the [facility], please go directly to the [facility] and check in at the registration area.  Wear comfortable clothing and clothing appropriate for easy access to any Portacath or PICC line.   We strive to give you quality time with your provider. You may need to reschedule your appointment if you arrive late (15 or more minutes).  Arriving late affects you and other patients whose appointments are after yours.  Also, if you miss three or more appointments without notifying the office, you may be dismissed from the clinic at the provider's discretion.      For prescription refill requests, have your pharmacy contact our office and allow 72 hours for refills to be completed.    Today you received the following chemotherapy and/or immunotherapy agents Cytoxan, Vincristine, Adriamycin, Rituxan      To help prevent nausea and vomiting after your treatment, we encourage you to take your nausea medication as directed.  BELOW ARE SYMPTOMS THAT SHOULD BE REPORTED IMMEDIATELY: *FEVER GREATER THAN 100.4 F (38 C) OR HIGHER *CHILLS OR SWEATING *NAUSEA AND VOMITING THAT IS NOT CONTROLLED WITH YOUR NAUSEA MEDICATION *UNUSUAL SHORTNESS OF BREATH *UNUSUAL BRUISING OR BLEEDING *URINARY PROBLEMS (pain or burning when urinating, or frequent urination) *BOWEL PROBLEMS (unusual diarrhea, constipation, pain near the anus) TENDERNESS IN MOUTH AND THROAT WITH OR WITHOUT PRESENCE OF ULCERS (sore throat, sores in mouth, or a toothache) UNUSUAL RASH, SWELLING OR PAIN  UNUSUAL VAGINAL DISCHARGE OR ITCHING   Items with * indicate a potential emergency and should be followed up as soon as possible or go to the Emergency Department if any problems should occur.  Please show the CHEMOTHERAPY ALERT CARD or IMMUNOTHERAPY  ALERT CARD at check-in to the Emergency Department and triage nurse. Should you have questions after your visit or need to cancel or reschedule your appointment, please contact [facility]  [phone] and follow the prompts.  Office hours are 8:00 a.m. to 4:30 p.m. Monday - Friday. Please note that voicemails left after 4:00 p.m. may not be returned until the following business day.  We are closed weekends and major holidays. You have access to a nurse at all times for urgent questions. Please call the main number to the clinic [phone] and follow the prompts.  For any non-urgent questions, you may also contact your provider using MyChart. We now offer e-Visits for anyone 62 and older to request care online for non-urgent symptoms. For details visit [email or web address].   Also download the MyChart app! Go to the app store, search "MyChart", open the app, select [facility], and log in with your MyChart username and password.  Due to Covid, a mask is required upon entering the hospital/clinic. If you do not have a mask, one will be given to you upon arrival. For doctor visits, patients may have 1 support person aged 32 or older with them. For treatment visits, patients cannot have anyone with them due to current Covid guidelines and our immunocompromised population.

## 2021-10-07 NOTE — Progress Notes (Signed)
Hematology and Oncology Follow Up Visit  Aaron LABONTE Sr. [id number] [birth year] 66 y.o. 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 Plancarte is back for for follow-up.  He is doing pretty well.  He feels tired.  I am sure a lot of this has to do with the treatments that he has had.  He still has a indwelling Foley catheter.  I know he has had problems with infections with this.  When we last saw him, he had Klebsiella in the urine.  He is gets transfused during his cycle of treatment.  His appetite is doing okay.  He has had no cough or shortness of breath.  He has had no chest pain.  He has had no diarrhea.  He has had no rashes.  There is been no leg swelling.  His blood sugars, as always, are on the high side.  Currently, I would say his performance status is probably ECOG 1.    Medications:  Current Outpatient Medications:    acetaminophen (TYLENOL) 325 MG tablet, Take 650 mg by mouth every 6 (six) hours as needed for mild pain or headache., Disp: , Rfl:    albuterol (PROAIR HFA) 108 (90 Base) MCG/ACT inhaler, Inhale 1-2 puffs into the lungs every 6 (six) hours as needed for wheezing or shortness of breath., Disp: 8 g, Rfl: 6   alendronate (FOSAMAX) 70 MG tablet, Take 70 mg by mouth every Sunday., Disp: , Rfl:    aspirin EC 81 MG EC tablet, Take 1 tablet (81 mg total) by mouth daily at 6 (six) AM. Swallow whole. (Patient taking differently: Take 81 mg by mouth in the morning. Swallow whole.), Disp: 30 tablet, Rfl: 11   atenolol (TENORMIN) 100 MG tablet, Take 100 mg by mouth daily., Disp: , Rfl:    blood glucose meter kit and supplies KIT, Dispense based on patient and insurance preference. Use up to four times daily as directed., Disp: 1 each, Rfl: 0   calcium carbonate (OSCAL) 1500 (600 Ca) MG TABS tablet, Take 600 mg of elemental calcium by  mouth daily with breakfast., Disp: , Rfl:    Continuous Blood Gluc Receiver (FREESTYLE LIBRE 2 READER) DEVI, , Disp: , Rfl:    Continuous Blood Gluc Sensor (FREESTYLE LIBRE 14 DAY SENSOR) MISC, Inject 1 Device into the skin every 14 (fourteen) days., Disp: , Rfl:    diclofenac Sodium (VOLTAREN) 1 % GEL, Apply 2 g topically daily as needed (to painful sites)., Disp: , Rfl:    DULoxetine (CYMBALTA) 60 MG capsule, Take 60 mg by mouth at bedtime., Disp: , Rfl:    famciclovir (FAMVIR) 250 MG tablet, Take 1 tablet (250 mg total) by mouth 2 (two) times daily., Disp: 30 tablet, Rfl: 8   Fish Oil OIL, Take 1,500 mg by mouth daily., Disp: , Rfl:    insulin glargine (LANTUS) 100 UNIT/ML Solostar Pen, Inject 25 Units into the skin daily., Disp: 15 mL, Rfl: 11   insulin lispro (HUMALOG) 100 UNIT/ML KwikPen, Inject 2-15 Units into the skin 4 (four) times daily - after meals and at bedtime. Glucose 121 - 150: 2 units, Glucose 151 - 200: 3 units, Glucose 201 - 250: 5 units, Glucose 251 - 300: 8 units, Glucose 301 - 350: 11 units, Glucose 351 - 400: 15 units, Glucose > 400 call  MD, Disp: 15 mL, Rfl: 11   Insulin Pen Needle 32G X 4 MM MISC, 1 each by Does not apply route 4 (four) times daily - after meals and at bedtime., Disp: 100 each, Rfl: 5   nitrofurantoin, macrocrystal-monohydrate, (MACROBID) 100 MG capsule, Take 1 capsule (100 mg total) by mouth 2 (two) times daily., Disp: 14 capsule, Rfl: 0   omeprazole (PRILOSEC) 40 MG capsule, Take 40 mg by mouth every evening., Disp: , Rfl:    potassium chloride SA (KLOR-CON M20) 20 MEQ tablet, TAKE 1 TABLET (20 MEQ TOTAL) BY MOUTH DAILY.  (Patient taking differently: Take 20 mEq by mouth in the morning.), Disp: 30 tablet, Rfl: 5   predniSONE (DELTASONE) 20 MG tablet, Take 60 mg by mouth daily., Disp: , Rfl:    rosuvastatin (CRESTOR) 10 MG tablet, Take 10 mg by mouth daily., Disp: , Rfl:    SEREVENT DISKUS 50 MCG/ACT diskus inhaler, Inhale 1 puff into the lungs 2 (two) times  daily., Disp: , Rfl:    sulfamethoxazole-trimethoprim (BACTRIM DS) 800-160 MG tablet, Take 1 tablet by mouth daily., Disp: 20 tablet, Rfl: 4   tadalafil (CIALIS) 5 MG tablet, Take 5 mg by mouth in the morning., Disp: , Rfl:    tamsulosin (FLOMAX) 0.4 MG CAPS capsule, Take 0.4 mg by mouth every evening., Disp: , Rfl:    Zinc 50 MG TABS, Take 50 mg by mouth daily., Disp: , Rfl:    allopurinol (ZYLOPRIM) 100 MG tablet, Take 1 tablet (100 mg total) by mouth daily. Start 3 days BEFORE chemotherapy starts (Patient not taking: Reported on 08/13/2021), Disp: 30 tablet, Rfl: 0   LORazepam (ATIVAN) 0.5 MG tablet, Take 1 tablet (0.5 mg total) by mouth every 8 (eight) hours. Take for nausea prn (Patient not taking: Reported on 07/18/2021), Disp: 30 tablet, Rfl: 1   ondansetron (ZOFRAN) 8 MG tablet, Take 1 tablet (8 mg total) by mouth 2 (two) times daily as needed for refractory nausea / vomiting. Start on day 3 after cyclophosphamide chemotherapy. (Patient not taking: Reported on 10/07/2021), Disp: 30 tablet, Rfl: 1   prochlorperazine (COMPAZINE) 10 MG tablet, Take 1 tablet (10 mg total) by mouth every 6 (six) hours as needed (Nausea or vomiting). (Patient not taking: Reported on 07/18/2021), Disp: 30 tablet, Rfl: 6  Allergies:  Allergies  Allergen Reactions   Fexofenadine Other (See Comments)    "dries me out too much"   Hydrochlorothiazide Other (See Comments)    Cramps   Penicillins Hives   Pravastatin Sodium Other (See Comments)    Aches   Requip [Ropinirole Hcl] Other (See Comments)    Bad dreams    Past Medical History, Surgical history, Social history, and Family History were reviewed and updated.  Review of Systems: Review of Systems  Constitutional: Negative.   HENT:  Negative.    Eyes: Negative.   Respiratory: Negative.    Cardiovascular: Negative.   Gastrointestinal: Negative.   Endocrine: Negative.   Genitourinary: Negative.    Musculoskeletal: Negative.   Skin: Negative.    Neurological: Negative.   Hematological: Negative.   Psychiatric/Behavioral: Negative.     Physical Exam:  height is 6' (1.829 m) and weight is 166 lb 6.4 oz (75.5 kg). His oral temperature is 98.2 F (36.8 C). His blood pressure is 130/92 (abnormal) and his pulse is 124 (abnormal). His respiration is 17 and oxygen saturation is 99%.   Wt Readings from Last 3 Encounters:  10/07/21 166 lb 6.4 oz (75.5 kg)  09/16/21  167 lb (75.8 kg)  08/26/21 167 lb (75.8 kg)  His vital signs show temperature of 98.6.  Pulse 72.  Blood pressure 103/62.  Weight is 180 pounds.  Physical Exam Vitals reviewed.  HENT:     Head: Normocephalic and atraumatic.  Eyes:     Pupils: Pupils are equal, round, and reactive to light.  Cardiovascular:     Rate and Rhythm: Normal rate and regular rhythm.     Heart sounds: Normal heart sounds.  Pulmonary:     Effort: Pulmonary effort is normal.     Breath sounds: Normal breath sounds.  Abdominal:     General: Bowel sounds are normal.     Palpations: Abdomen is soft.  Musculoskeletal:        General: No tenderness or deformity. Normal range of motion.     Cervical back: Normal range of motion.     Comments: Evaluation of her left elbow does show some fluctuance.  There is some erythema.  There is slight tenderness to palpation.  There is no exudate from the eschar.  He has decent range of motion of the elbow.  Lymphadenopathy:     Cervical: No cervical adenopathy.  Skin:    General: Skin is warm and dry.     Findings: No erythema or rash.  Neurological:     Mental Status: He is alert and oriented to person, place, and time.  Psychiatric:        Behavior: Behavior normal.        Thought Content: Thought content normal.        Judgment: Judgment normal.     Lab Results  Component Value Date   WBC 5.8 10/07/2021   HGB 9.7 (L) 10/07/2021   HCT 29.1 (L) 10/07/2021   MCV 93.0 10/07/2021   PLT 142 (L) 10/07/2021     Chemistry      Component Value  Date/Time   NA 134 (L) 10/07/2021 0819   K 4.0 10/07/2021 0819   CL 100 10/07/2021 0819   CO2 24 10/07/2021 0819   BUN 18 10/07/2021 0819   CREATININE 0.72 10/07/2021 0819      Component Value Date/Time   CALCIUM 9.1 10/07/2021 0819   ALKPHOS 57 10/07/2021 0819   AST 20 10/07/2021 0819   ALT 22 10/07/2021 0819   BILITOT 0.5 10/07/2021 0819      Impression and Plan: Mr. Sollenberger is a very nice 66 year old white male.  He has diffuse large cell non-Hodgkin's lymphoma.  He has extra nodal disease.  Again, he has a fairly high IPI score.  He has had 6 cycles of chemotherapy with R-CHOP.  Again, he had a very nice response to date.  However, it has been quite a struggle for him.     We will go with his 7th cycle of treatment.  He will need 8 cycles of treatment.  After that, we will have to think about how we have to do CNS prophylaxis.  It works well for him to have fluids in 3 days.  We will then have him come back later that week for labs and possible transfusion.  We always have to watch for his urine.  This is a source of infection for him.  We will have him come back to see [place] in 3 weeks for his eighth and final cycle of treatment.  After that, we will do his PET scan.   [name], MD 6/2/20239:47 AM

## 2021-10-10 ENCOUNTER — Inpatient Hospital Stay: Payer: Medicare Other

## 2021-10-10 ENCOUNTER — Ambulatory Visit: Payer: Medicare Other

## 2021-10-10 ENCOUNTER — Encounter: Payer: Self-pay | Admitting: Hematology & Oncology

## 2021-10-10 VITALS — BP 131/82 | HR 95 | Temp 98.3°F | Resp 17

## 2021-10-10 DIAGNOSIS — E1165 Type 2 diabetes mellitus with hyperglycemia: Secondary | ICD-10-CM | POA: Diagnosis not present

## 2021-10-10 DIAGNOSIS — C8338 Diffuse large B-cell lymphoma, lymph nodes of multiple sites: Secondary | ICD-10-CM

## 2021-10-10 DIAGNOSIS — Z794 Long term (current) use of insulin: Secondary | ICD-10-CM | POA: Diagnosis not present

## 2021-10-10 DIAGNOSIS — C8330 Diffuse large B-cell lymphoma, unspecified site: Secondary | ICD-10-CM | POA: Diagnosis not present

## 2021-10-10 DIAGNOSIS — Z5189 Encounter for other specified aftercare: Secondary | ICD-10-CM | POA: Diagnosis not present

## 2021-10-10 DIAGNOSIS — I714 Abdominal aortic aneurysm, without rupture, unspecified: Secondary | ICD-10-CM | POA: Diagnosis not present

## 2021-10-10 DIAGNOSIS — M25552 Pain in left hip: Secondary | ICD-10-CM

## 2021-10-10 DIAGNOSIS — Z5112 Encounter for antineoplastic immunotherapy: Secondary | ICD-10-CM | POA: Diagnosis not present

## 2021-10-10 DIAGNOSIS — S2249XS Multiple fractures of ribs, unspecified side, sequela: Secondary | ICD-10-CM

## 2021-10-10 DIAGNOSIS — Z5111 Encounter for antineoplastic chemotherapy: Secondary | ICD-10-CM | POA: Diagnosis not present

## 2021-10-10 LAB — CBC WITH DIFFERENTIAL (CANCER CENTER ONLY)
Abs Immature Granulocytes: 0.03 10*3/uL (ref 0.00–0.07)
Basophils Absolute: 0 10*3/uL (ref 0.0–0.1)
Basophils Relative: 0 %
Eosinophils Absolute: 0 10*3/uL (ref 0.0–0.5)
Eosinophils Relative: 0 %
HCT: 24.1 % — ABNORMAL LOW (ref 39.0–52.0)
Hemoglobin: 8.2 g/dL — ABNORMAL LOW (ref 13.0–17.0)
Immature Granulocytes: 1 %
Lymphocytes Relative: 5 %
Lymphs Abs: 0.2 10*3/uL — ABNORMAL LOW (ref 0.7–4.0)
MCH: 31.4 pg (ref 26.0–34.0)
MCHC: 34 g/dL (ref 30.0–36.0)
MCV: 92.3 fL (ref 80.0–100.0)
Monocytes Absolute: 0.2 10*3/uL (ref 0.1–1.0)
Monocytes Relative: 5 %
Neutro Abs: 3.5 10*3/uL (ref 1.7–7.7)
Neutrophils Relative %: 89 %
Platelet Count: 116 10*3/uL — ABNORMAL LOW (ref 150–400)
RBC: 2.61 MIL/uL — ABNORMAL LOW (ref 4.22–5.81)
RDW: 19.3 % — ABNORMAL HIGH (ref 11.5–15.5)
WBC Count: 3.9 10*3/uL — ABNORMAL LOW (ref 4.0–10.5)
nRBC: 0 % (ref 0.0–0.2)

## 2021-10-10 LAB — CMP (CANCER CENTER ONLY)
ALT: 19 U/L (ref 0–44)
AST: 13 U/L — ABNORMAL LOW (ref 15–41)
Albumin: 3.8 g/dL (ref 3.5–5.0)
Alkaline Phosphatase: 55 U/L (ref 38–126)
Anion gap: 8 (ref 5–15)
BUN: 32 mg/dL — ABNORMAL HIGH (ref 8–23)
CO2: 24 mmol/L (ref 22–32)
Calcium: 8.7 mg/dL — ABNORMAL LOW (ref 8.9–10.3)
Chloride: 103 mmol/L (ref 98–111)
Creatinine: 0.69 mg/dL (ref 0.61–1.24)
GFR, Estimated: >60 mL/min (ref >60)
Glucose, Bld: 256 mg/dL — ABNORMAL HIGH (ref 70–99)
Potassium: 4 mmol/L (ref 3.5–5.1)
Sodium: 135 mmol/L (ref 135–145)
Total Bilirubin: 0.4 mg/dL (ref 0.3–1.2)
Total Protein: 5.6 g/dL — ABNORMAL LOW (ref 6.5–8.1)

## 2021-10-10 MED ORDER — SODIUM CHLORIDE 0.9% FLUSH
10.0000 mL | Freq: Once | INTRAVENOUS | Status: AC | PRN
  Administered 2021-10-10: 10 mL

## 2021-10-10 MED ORDER — SODIUM CHLORIDE 0.9 % IV SOLN: Freq: Once | INTRAVENOUS | Status: AC

## 2021-10-10 MED ORDER — PEGFILGRASTIM-BMEZ 6 MG/0.6ML ~~LOC~~ SOSY
6.0000 mg | PREFILLED_SYRINGE | Freq: Once | SUBCUTANEOUS | Status: AC
  Administered 2021-10-10: 6 mg via SUBCUTANEOUS
  Filled 2021-10-10: qty 0.6

## 2021-10-10 MED ORDER — HEPARIN SOD (PORK) LOCK FLUSH 100 UNIT/ML IV SOLN
500.0000 [IU] | Freq: Once | INTRAVENOUS | Status: AC | PRN
  Administered 2021-10-10: 500 [IU]

## 2021-10-10 NOTE — Patient Instructions (Signed)

## 2021-10-10 NOTE — Patient Instructions (Signed)

## 2021-10-14 ENCOUNTER — Inpatient Hospital Stay: Payer: Medicare Other

## 2021-10-14 ENCOUNTER — Other Ambulatory Visit (HOSPITAL_BASED_OUTPATIENT_CLINIC_OR_DEPARTMENT_OTHER): Payer: Self-pay

## 2021-10-14 ENCOUNTER — Other Ambulatory Visit: Payer: Self-pay

## 2021-10-14 ENCOUNTER — Other Ambulatory Visit: Payer: Self-pay | Admitting: Oncology

## 2021-10-14 VITALS — BP 113/69 | HR 83 | Temp 98.8°F | Resp 17

## 2021-10-14 DIAGNOSIS — C8338 Diffuse large B-cell lymphoma, lymph nodes of multiple sites: Secondary | ICD-10-CM

## 2021-10-14 DIAGNOSIS — Z794 Long term (current) use of insulin: Secondary | ICD-10-CM | POA: Diagnosis not present

## 2021-10-14 DIAGNOSIS — C8330 Diffuse large B-cell lymphoma, unspecified site: Secondary | ICD-10-CM | POA: Diagnosis not present

## 2021-10-14 DIAGNOSIS — Z5112 Encounter for antineoplastic immunotherapy: Secondary | ICD-10-CM | POA: Diagnosis not present

## 2021-10-14 DIAGNOSIS — I714 Abdominal aortic aneurysm, without rupture, unspecified: Secondary | ICD-10-CM | POA: Diagnosis not present

## 2021-10-14 DIAGNOSIS — Z5189 Encounter for other specified aftercare: Secondary | ICD-10-CM | POA: Diagnosis not present

## 2021-10-14 DIAGNOSIS — S2249XS Multiple fractures of ribs, unspecified side, sequela: Secondary | ICD-10-CM

## 2021-10-14 DIAGNOSIS — M25552 Pain in left hip: Secondary | ICD-10-CM

## 2021-10-14 DIAGNOSIS — Z5111 Encounter for antineoplastic chemotherapy: Secondary | ICD-10-CM | POA: Diagnosis not present

## 2021-10-14 DIAGNOSIS — B354 Tinea corporis: Secondary | ICD-10-CM

## 2021-10-14 DIAGNOSIS — E1165 Type 2 diabetes mellitus with hyperglycemia: Secondary | ICD-10-CM | POA: Diagnosis not present

## 2021-10-14 LAB — CBC WITH DIFFERENTIAL (CANCER CENTER ONLY)
Abs Immature Granulocytes: 0.1 10*3/uL — ABNORMAL HIGH (ref 0.00–0.07)
Basophils Absolute: 0 10*3/uL (ref 0.0–0.1)
Basophils Relative: 0 %
Eosinophils Absolute: 0 10*3/uL (ref 0.0–0.5)
Eosinophils Relative: 0 %
HCT: 28.4 % — ABNORMAL LOW (ref 39.0–52.0)
Hemoglobin: 9.5 g/dL — ABNORMAL LOW (ref 13.0–17.0)
Lymphocytes Relative: 5 %
Lymphs Abs: 0.2 10*3/uL — ABNORMAL LOW (ref 0.7–4.0)
MCH: 31.3 pg (ref 26.0–34.0)
MCHC: 33.5 g/dL (ref 30.0–36.0)
MCV: 93.4 fL (ref 80.0–100.0)
Metamyelocytes Relative: 2 %
Monocytes Absolute: 0.1 10*3/uL (ref 0.1–1.0)
Monocytes Relative: 2 %
Myelocytes: 1 %
Neutro Abs: 4.3 10*3/uL (ref 1.7–7.7)
Neutrophils Relative %: 90 %
Platelet Count: 65 10*3/uL — ABNORMAL LOW (ref 150–400)
RBC: 3.04 MIL/uL — ABNORMAL LOW (ref 4.22–5.81)
RDW: 18.4 % — ABNORMAL HIGH (ref 11.5–15.5)
Smear Review: NORMAL
WBC Count: 4.8 10*3/uL (ref 4.0–10.5)
nRBC: 0 % (ref 0.0–0.2)

## 2021-10-14 LAB — CMP (CANCER CENTER ONLY)
ALT: 30 U/L (ref 0–44)
AST: 19 U/L (ref 15–41)
Albumin: 4 g/dL (ref 3.5–5.0)
Alkaline Phosphatase: 76 U/L (ref 38–126)
Anion gap: 10 (ref 5–15)
BUN: 25 mg/dL — ABNORMAL HIGH (ref 8–23)
CO2: 26 mmol/L (ref 22–32)
Calcium: 8.9 mg/dL (ref 8.9–10.3)
Chloride: 96 mmol/L — ABNORMAL LOW (ref 98–111)
Creatinine: 0.95 mg/dL (ref 0.61–1.24)
GFR, Estimated: >60 mL/min (ref >60)
Glucose, Bld: 359 mg/dL — ABNORMAL HIGH (ref 70–99)
Potassium: 4.8 mmol/L (ref 3.5–5.1)
Sodium: 132 mmol/L — ABNORMAL LOW (ref 135–145)
Total Bilirubin: 1 mg/dL (ref 0.3–1.2)
Total Protein: 5.9 g/dL — ABNORMAL LOW (ref 6.5–8.1)

## 2021-10-14 LAB — SAMPLE TO BLOOD BANK

## 2021-10-14 LAB — LACTATE DEHYDROGENASE: LDH: 159 U/L (ref 98–192)

## 2021-10-14 MED ORDER — FLUCONAZOLE 100 MG PO TABS
100.0000 mg | ORAL_TABLET | Freq: Every day | ORAL | 0 refills | Status: DC
  Filled 2021-10-14: qty 3, 3d supply, fill #0

## 2021-10-14 MED ORDER — ALTEPLASE 2 MG IJ SOLR: 2.0000 mg | Freq: Once | INTRAMUSCULAR | Status: DC | PRN

## 2021-10-14 MED ORDER — SODIUM CHLORIDE 0.9 % IV SOLN: Freq: Once | INTRAVENOUS | Status: DC

## 2021-10-14 MED ORDER — HEPARIN SOD (PORK) LOCK FLUSH 100 UNIT/ML IV SOLN
500.0000 [IU] | Freq: Once | INTRAVENOUS | Status: AC | PRN
  Administered 2021-10-14: 500 [IU]

## 2021-10-14 MED ORDER — SODIUM CHLORIDE 0.9% FLUSH
10.0000 mL | Freq: Once | INTRAVENOUS | Status: AC | PRN
  Administered 2021-10-14: 10 mL

## 2021-10-14 MED ORDER — SODIUM CHLORIDE 0.9% FLUSH: 10.0000 mL | Freq: Once | INTRAVENOUS | Status: DC | PRN

## 2021-10-14 MED ORDER — HEPARIN SOD (PORK) LOCK FLUSH 100 UNIT/ML IV SOLN: 500.0000 [IU] | Freq: Once | INTRAVENOUS | Status: DC | PRN

## 2021-10-14 MED ORDER — SODIUM CHLORIDE 0.9 % IV SOLN: Freq: Once | INTRAVENOUS | Status: AC

## 2021-10-14 MED ORDER — HEPARIN SOD (PORK) LOCK FLUSH 100 UNIT/ML IV SOLN: 250.0000 [IU] | Freq: Once | INTRAVENOUS | Status: DC | PRN

## 2021-10-14 MED ORDER — SODIUM CHLORIDE 0.9% FLUSH: 3.0000 mL | Freq: Once | INTRAVENOUS | Status: DC | PRN

## 2021-10-14 NOTE — Progress Notes (Signed)
Pt in for fluids today, stated he had a new spot on his back that was sore and requested this nurse to look at. Patient has 2.5 inch circular red area on upper left back near ribcage, it presents with one big ring and one small ring and scablike appearence in middle. Showed MD who requested diflucan x 3 days. Pt aware and order placed.

## 2021-10-14 NOTE — Patient Instructions (Signed)

## 2021-10-14 NOTE — Patient Instructions (Signed)

## 2021-10-17 ENCOUNTER — Other Ambulatory Visit: Payer: Self-pay | Admitting: Hematology & Oncology

## 2021-10-17 ENCOUNTER — Inpatient Hospital Stay: Payer: Medicare Other

## 2021-10-17 ENCOUNTER — Other Ambulatory Visit: Payer: Self-pay | Admitting: Oncology

## 2021-10-17 ENCOUNTER — Other Ambulatory Visit (HOSPITAL_BASED_OUTPATIENT_CLINIC_OR_DEPARTMENT_OTHER): Payer: Self-pay

## 2021-10-17 ENCOUNTER — Other Ambulatory Visit: Payer: Self-pay | Admitting: *Deleted

## 2021-10-17 VITALS — BP 99/74 | HR 97 | Temp 97.9°F | Resp 18

## 2021-10-17 DIAGNOSIS — E1165 Type 2 diabetes mellitus with hyperglycemia: Secondary | ICD-10-CM | POA: Diagnosis not present

## 2021-10-17 DIAGNOSIS — D649 Anemia, unspecified: Secondary | ICD-10-CM

## 2021-10-17 DIAGNOSIS — C8338 Diffuse large B-cell lymphoma, lymph nodes of multiple sites: Secondary | ICD-10-CM

## 2021-10-17 DIAGNOSIS — Z5112 Encounter for antineoplastic immunotherapy: Secondary | ICD-10-CM | POA: Diagnosis not present

## 2021-10-17 DIAGNOSIS — M25552 Pain in left hip: Secondary | ICD-10-CM

## 2021-10-17 DIAGNOSIS — B354 Tinea corporis: Secondary | ICD-10-CM

## 2021-10-17 DIAGNOSIS — N390 Urinary tract infection, site not specified: Secondary | ICD-10-CM

## 2021-10-17 DIAGNOSIS — Z794 Long term (current) use of insulin: Secondary | ICD-10-CM | POA: Diagnosis not present

## 2021-10-17 DIAGNOSIS — Z5189 Encounter for other specified aftercare: Secondary | ICD-10-CM | POA: Diagnosis not present

## 2021-10-17 DIAGNOSIS — S2249XS Multiple fractures of ribs, unspecified side, sequela: Secondary | ICD-10-CM

## 2021-10-17 DIAGNOSIS — I714 Abdominal aortic aneurysm, without rupture, unspecified: Secondary | ICD-10-CM | POA: Diagnosis not present

## 2021-10-17 DIAGNOSIS — C8330 Diffuse large B-cell lymphoma, unspecified site: Secondary | ICD-10-CM | POA: Diagnosis not present

## 2021-10-17 DIAGNOSIS — Z5111 Encounter for antineoplastic chemotherapy: Secondary | ICD-10-CM | POA: Diagnosis not present

## 2021-10-17 LAB — CBC WITH DIFFERENTIAL (CANCER CENTER ONLY)
Abs Immature Granulocytes: 0.01 10*3/uL (ref 0.00–0.07)
Basophils Absolute: 0 10*3/uL (ref 0.0–0.1)
Basophils Relative: 1 %
Eosinophils Absolute: 0 10*3/uL (ref 0.0–0.5)
Eosinophils Relative: 0 %
HCT: 24.2 % — ABNORMAL LOW (ref 39.0–52.0)
Hemoglobin: 8.5 g/dL — ABNORMAL LOW (ref 13.0–17.0)
Immature Granulocytes: 1 %
Lymphocytes Relative: 24 %
Lymphs Abs: 0.2 10*3/uL — ABNORMAL LOW (ref 0.7–4.0)
MCH: 31.1 pg (ref 26.0–34.0)
MCHC: 35.1 g/dL (ref 30.0–36.0)
MCV: 88.6 fL (ref 80.0–100.0)
Monocytes Absolute: 0.2 10*3/uL (ref 0.1–1.0)
Monocytes Relative: 23 %
Neutro Abs: 0.5 10*3/uL — ABNORMAL LOW (ref 1.7–7.7)
Neutrophils Relative %: 51 %
Platelet Count: 25 10*3/uL — ABNORMAL LOW (ref 150–400)
RBC: 2.73 MIL/uL — ABNORMAL LOW (ref 4.22–5.81)
RDW: 17.1 % — ABNORMAL HIGH (ref 11.5–15.5)
Smear Review: NORMAL
WBC Count: 1 10*3/uL — ABNORMAL LOW (ref 4.0–10.5)
nRBC: 0 % (ref 0.0–0.2)

## 2021-10-17 LAB — CMP (CANCER CENTER ONLY)
ALT: 15 U/L (ref 0–44)
AST: 11 U/L — ABNORMAL LOW (ref 15–41)
Albumin: 4 g/dL (ref 3.5–5.0)
Alkaline Phosphatase: 60 U/L (ref 38–126)
Anion gap: 12 (ref 5–15)
BUN: 26 mg/dL — ABNORMAL HIGH (ref 8–23)
CO2: 22 mmol/L (ref 22–32)
Calcium: 8.8 mg/dL — ABNORMAL LOW (ref 8.9–10.3)
Chloride: 94 mmol/L — ABNORMAL LOW (ref 98–111)
Creatinine: 1.2 mg/dL (ref 0.61–1.24)
GFR, Estimated: >60 mL/min (ref >60)
Glucose, Bld: 405 mg/dL — ABNORMAL HIGH (ref 70–99)
Potassium: 4.1 mmol/L (ref 3.5–5.1)
Sodium: 128 mmol/L — ABNORMAL LOW (ref 135–145)
Total Bilirubin: 1 mg/dL (ref 0.3–1.2)
Total Protein: 6.4 g/dL — ABNORMAL LOW (ref 6.5–8.1)

## 2021-10-17 LAB — PREPARE RBC (CROSSMATCH)

## 2021-10-17 MED ORDER — HEPARIN SOD (PORK) LOCK FLUSH 100 UNIT/ML IV SOLN: 500.0000 [IU] | Freq: Once | INTRAVENOUS | Status: DC | PRN

## 2021-10-17 MED ORDER — DIPHENHYDRAMINE HCL 25 MG PO CAPS: 25.0000 mg | ORAL_CAPSULE | Freq: Once | ORAL | Status: DC

## 2021-10-17 MED ORDER — SODIUM CHLORIDE 0.9 % IV SOLN: Freq: Once | INTRAVENOUS | Status: AC

## 2021-10-17 MED ORDER — SODIUM CHLORIDE 0.9% FLUSH: 10.0000 mL | Freq: Once | INTRAVENOUS | Status: DC | PRN

## 2021-10-17 MED ORDER — ACETAMINOPHEN 325 MG PO TABS: 650.0000 mg | ORAL_TABLET | Freq: Once | ORAL | Status: DC

## 2021-10-17 NOTE — Progress Notes (Signed)
Type and cross ordered.

## 2021-10-17 NOTE — Patient Instructions (Signed)

## 2021-10-18 ENCOUNTER — Encounter: Payer: Self-pay | Admitting: Hematology & Oncology

## 2021-10-18 ENCOUNTER — Other Ambulatory Visit (HOSPITAL_BASED_OUTPATIENT_CLINIC_OR_DEPARTMENT_OTHER): Payer: Self-pay

## 2021-10-18 LAB — TYPE AND SCREEN
ABO/RH(D): B POS
Antibody Screen: NEGATIVE
Unit division: 0

## 2021-10-18 LAB — BPAM RBC
Blood Product Expiration Date: 202307022359
ISSUE DATE / TIME: 202306121221
Unit Type and Rh: 7300

## 2021-10-18 MED ORDER — FLUCONAZOLE 100 MG PO TABS
100.0000 mg | ORAL_TABLET | Freq: Every day | ORAL | 0 refills | Status: DC
  Filled 2021-10-18: qty 3, 3d supply, fill #0

## 2021-10-19 ENCOUNTER — Encounter: Payer: Self-pay | Admitting: *Deleted

## 2021-10-20 ENCOUNTER — Other Ambulatory Visit (HOSPITAL_BASED_OUTPATIENT_CLINIC_OR_DEPARTMENT_OTHER): Payer: Self-pay

## 2021-10-20 ENCOUNTER — Other Ambulatory Visit: Payer: Self-pay | Admitting: Oncology

## 2021-10-20 ENCOUNTER — Inpatient Hospital Stay: Payer: Medicare Other

## 2021-10-20 ENCOUNTER — Other Ambulatory Visit: Payer: Self-pay | Admitting: *Deleted

## 2021-10-20 ENCOUNTER — Telehealth: Payer: Self-pay | Admitting: *Deleted

## 2021-10-20 VITALS — BP 106/66 | HR 91 | Temp 99.7°F | Resp 17

## 2021-10-20 DIAGNOSIS — M25552 Pain in left hip: Secondary | ICD-10-CM

## 2021-10-20 DIAGNOSIS — C8330 Diffuse large B-cell lymphoma, unspecified site: Secondary | ICD-10-CM

## 2021-10-20 DIAGNOSIS — I714 Abdominal aortic aneurysm, without rupture, unspecified: Secondary | ICD-10-CM | POA: Diagnosis not present

## 2021-10-20 DIAGNOSIS — C8338 Diffuse large B-cell lymphoma, lymph nodes of multiple sites: Secondary | ICD-10-CM

## 2021-10-20 DIAGNOSIS — Z5112 Encounter for antineoplastic immunotherapy: Secondary | ICD-10-CM | POA: Diagnosis not present

## 2021-10-20 DIAGNOSIS — S2249XS Multiple fractures of ribs, unspecified side, sequela: Secondary | ICD-10-CM

## 2021-10-20 DIAGNOSIS — D649 Anemia, unspecified: Secondary | ICD-10-CM

## 2021-10-20 DIAGNOSIS — Z5189 Encounter for other specified aftercare: Secondary | ICD-10-CM | POA: Diagnosis not present

## 2021-10-20 DIAGNOSIS — E1165 Type 2 diabetes mellitus with hyperglycemia: Secondary | ICD-10-CM | POA: Diagnosis not present

## 2021-10-20 DIAGNOSIS — Z5111 Encounter for antineoplastic chemotherapy: Secondary | ICD-10-CM | POA: Diagnosis not present

## 2021-10-20 DIAGNOSIS — Z95828 Presence of other vascular implants and grafts: Secondary | ICD-10-CM

## 2021-10-20 DIAGNOSIS — Z794 Long term (current) use of insulin: Secondary | ICD-10-CM | POA: Diagnosis not present

## 2021-10-20 LAB — SAMPLE TO BLOOD BANK

## 2021-10-20 LAB — CMP (CANCER CENTER ONLY)
ALT: 25 U/L (ref 0–44)
AST: 30 U/L (ref 15–41)
Albumin: 3.2 g/dL — ABNORMAL LOW (ref 3.5–5.0)
Alkaline Phosphatase: 61 U/L (ref 38–126)
Anion gap: 8 (ref 5–15)
BUN: 12 mg/dL (ref 8–23)
CO2: 21 mmol/L — ABNORMAL LOW (ref 22–32)
Calcium: 8.2 mg/dL — ABNORMAL LOW (ref 8.9–10.3)
Chloride: 102 mmol/L (ref 98–111)
Creatinine: 0.86 mg/dL (ref 0.61–1.24)
GFR, Estimated: >60 mL/min (ref >60)
Glucose, Bld: 316 mg/dL — ABNORMAL HIGH (ref 70–99)
Potassium: 4.6 mmol/L (ref 3.5–5.1)
Sodium: 131 mmol/L — ABNORMAL LOW (ref 135–145)
Total Bilirubin: 0.6 mg/dL (ref 0.3–1.2)
Total Protein: 6.5 g/dL (ref 6.5–8.1)

## 2021-10-20 LAB — CBC WITH DIFFERENTIAL (CANCER CENTER ONLY)
Abs Immature Granulocytes: 0.15 10*3/uL — ABNORMAL HIGH (ref 0.00–0.07)
Basophils Absolute: 0 10*3/uL (ref 0.0–0.1)
Basophils Relative: 1 %
Eosinophils Absolute: 0 10*3/uL (ref 0.0–0.5)
Eosinophils Relative: 0 %
HCT: 27 % — ABNORMAL LOW (ref 39.0–52.0)
Hemoglobin: 9.4 g/dL — ABNORMAL LOW (ref 13.0–17.0)
Immature Granulocytes: 6 %
Lymphocytes Relative: 11 %
Lymphs Abs: 0.3 10*3/uL — ABNORMAL LOW (ref 0.7–4.0)
MCH: 31 pg (ref 26.0–34.0)
MCHC: 34.8 g/dL (ref 30.0–36.0)
MCV: 89.1 fL (ref 80.0–100.0)
Monocytes Absolute: 0.3 10*3/uL (ref 0.1–1.0)
Monocytes Relative: 11 %
Neutro Abs: 1.9 10*3/uL (ref 1.7–7.7)
Neutrophils Relative %: 71 %
Platelet Count: 61 10*3/uL — ABNORMAL LOW (ref 150–400)
RBC: 3.03 MIL/uL — ABNORMAL LOW (ref 4.22–5.81)
RDW: 16.8 % — ABNORMAL HIGH (ref 11.5–15.5)
Smear Review: NORMAL
WBC Count: 2.6 10*3/uL — ABNORMAL LOW (ref 4.0–10.5)
nRBC: 0 % (ref 0.0–0.2)

## 2021-10-20 MED ORDER — FLUCONAZOLE 100 MG PO TABS
100.0000 mg | ORAL_TABLET | Freq: Every day | ORAL | 1 refills | Status: DC
  Filled 2021-10-20: qty 10, 10d supply, fill #0
  Filled 2021-10-28 – 2021-11-01 (×2): qty 10, 10d supply, fill #1

## 2021-10-20 MED ORDER — SODIUM CHLORIDE 0.9 % IV SOLN: Freq: Once | INTRAVENOUS | Status: AC

## 2021-10-20 MED ORDER — HEPARIN SOD (PORK) LOCK FLUSH 100 UNIT/ML IV SOLN
500.0000 [IU] | Freq: Once | INTRAVENOUS | Status: AC | PRN
  Administered 2021-10-20: 500 [IU]

## 2021-10-20 MED ORDER — SODIUM CHLORIDE 0.9% FLUSH
10.0000 mL | Freq: Once | INTRAVENOUS | Status: AC | PRN
  Administered 2021-10-20: 10 mL

## 2021-10-20 NOTE — Progress Notes (Signed)
Pt states " the ringworm on my back is still there can Dr Marin Olp give me something to help?" Reviewed with MD, VO for Diflucan '100mg'$  1 PO Daily x10 days. Rx sent to [facility] per pt request

## 2021-10-20 NOTE — Telephone Encounter (Signed)
Call received from patient stating that he would like to come in for IVF's on Monday, 10/24/21.  OK for IVF's and labs on Monday per Dr. Marin Olp.  Message sent to scheduling.

## 2021-10-20 NOTE — Patient Instructions (Signed)

## 2021-10-21 ENCOUNTER — Encounter: Payer: Self-pay | Admitting: Hematology & Oncology

## 2021-10-21 ENCOUNTER — Other Ambulatory Visit (HOSPITAL_BASED_OUTPATIENT_CLINIC_OR_DEPARTMENT_OTHER): Payer: Self-pay

## 2021-10-21 ENCOUNTER — Other Ambulatory Visit: Payer: Self-pay | Admitting: Hematology & Oncology

## 2021-10-21 DIAGNOSIS — R338 Other retention of urine: Secondary | ICD-10-CM | POA: Diagnosis not present

## 2021-10-21 MED ORDER — NITROFURANTOIN MONOHYD MACRO 100 MG PO CAPS
100.0000 mg | ORAL_CAPSULE | Freq: Two times a day (BID) | ORAL | 0 refills | Status: DC
  Filled 2021-10-21: qty 14, 7d supply, fill #0

## 2021-10-21 MED ORDER — ALLOPURINOL 100 MG PO TABS
100.0000 mg | ORAL_TABLET | Freq: Every day | ORAL | 0 refills | Status: DC
  Filled 2021-10-28: qty 30, 30d supply, fill #0

## 2021-10-24 ENCOUNTER — Other Ambulatory Visit (HOSPITAL_BASED_OUTPATIENT_CLINIC_OR_DEPARTMENT_OTHER): Payer: Self-pay

## 2021-10-24 ENCOUNTER — Inpatient Hospital Stay: Payer: Medicare Other

## 2021-10-24 ENCOUNTER — Other Ambulatory Visit: Payer: Self-pay | Admitting: Oncology

## 2021-10-24 VITALS — BP 120/73 | HR 83 | Temp 97.6°F | Resp 17

## 2021-10-24 DIAGNOSIS — I714 Abdominal aortic aneurysm, without rupture, unspecified: Secondary | ICD-10-CM | POA: Diagnosis not present

## 2021-10-24 DIAGNOSIS — Z5189 Encounter for other specified aftercare: Secondary | ICD-10-CM | POA: Diagnosis not present

## 2021-10-24 DIAGNOSIS — C8330 Diffuse large B-cell lymphoma, unspecified site: Secondary | ICD-10-CM | POA: Diagnosis not present

## 2021-10-24 DIAGNOSIS — M25552 Pain in left hip: Secondary | ICD-10-CM

## 2021-10-24 DIAGNOSIS — Z5112 Encounter for antineoplastic immunotherapy: Secondary | ICD-10-CM | POA: Diagnosis not present

## 2021-10-24 DIAGNOSIS — D649 Anemia, unspecified: Secondary | ICD-10-CM

## 2021-10-24 DIAGNOSIS — S2249XS Multiple fractures of ribs, unspecified side, sequela: Secondary | ICD-10-CM

## 2021-10-24 DIAGNOSIS — Z95828 Presence of other vascular implants and grafts: Secondary | ICD-10-CM

## 2021-10-24 DIAGNOSIS — C8338 Diffuse large B-cell lymphoma, lymph nodes of multiple sites: Secondary | ICD-10-CM

## 2021-10-24 DIAGNOSIS — Z794 Long term (current) use of insulin: Secondary | ICD-10-CM | POA: Diagnosis not present

## 2021-10-24 DIAGNOSIS — Z5111 Encounter for antineoplastic chemotherapy: Secondary | ICD-10-CM | POA: Diagnosis not present

## 2021-10-24 DIAGNOSIS — E1165 Type 2 diabetes mellitus with hyperglycemia: Secondary | ICD-10-CM | POA: Diagnosis not present

## 2021-10-24 LAB — CMP (CANCER CENTER ONLY)
ALT: 30 U/L (ref 0–44)
AST: 25 U/L (ref 15–41)
Albumin: 3.7 g/dL (ref 3.5–5.0)
Alkaline Phosphatase: 77 U/L (ref 38–126)
Anion gap: 10 (ref 5–15)
BUN: 21 mg/dL (ref 8–23)
CO2: 23 mmol/L (ref 22–32)
Calcium: 9.4 mg/dL (ref 8.9–10.3)
Chloride: 99 mmol/L (ref 98–111)
Creatinine: 0.94 mg/dL (ref 0.61–1.24)
GFR, Estimated: >60 mL/min (ref >60)
Glucose, Bld: 272 mg/dL — ABNORMAL HIGH (ref 70–99)
Potassium: 4.4 mmol/L (ref 3.5–5.1)
Sodium: 132 mmol/L — ABNORMAL LOW (ref 135–145)
Total Bilirubin: 0.6 mg/dL (ref 0.3–1.2)
Total Protein: 6.5 g/dL (ref 6.5–8.1)

## 2021-10-24 LAB — CBC WITH DIFFERENTIAL (CANCER CENTER ONLY)
Abs Immature Granulocytes: 0.13 10*3/uL — ABNORMAL HIGH (ref 0.00–0.07)
Basophils Absolute: 0 10*3/uL (ref 0.0–0.1)
Basophils Relative: 0 %
Eosinophils Absolute: 0 10*3/uL (ref 0.0–0.5)
Eosinophils Relative: 0 %
HCT: 26.8 % — ABNORMAL LOW (ref 39.0–52.0)
Hemoglobin: 9.1 g/dL — ABNORMAL LOW (ref 13.0–17.0)
Immature Granulocytes: 3 %
Lymphocytes Relative: 18 %
Lymphs Abs: 0.8 10*3/uL (ref 0.7–4.0)
MCH: 31.3 pg (ref 26.0–34.0)
MCHC: 34 g/dL (ref 30.0–36.0)
MCV: 92.1 fL (ref 80.0–100.0)
Monocytes Absolute: 0.5 10*3/uL (ref 0.1–1.0)
Monocytes Relative: 11 %
Neutro Abs: 3.2 10*3/uL (ref 1.7–7.7)
Neutrophils Relative %: 68 %
Platelet Count: 99 10*3/uL — ABNORMAL LOW (ref 150–400)
RBC: 2.91 MIL/uL — ABNORMAL LOW (ref 4.22–5.81)
RDW: 17.3 % — ABNORMAL HIGH (ref 11.5–15.5)
Smear Review: NORMAL
WBC Count: 4.7 10*3/uL (ref 4.0–10.5)
nRBC: 0 % (ref 0.0–0.2)

## 2021-10-24 MED ORDER — SODIUM CHLORIDE 0.9% FLUSH
10.0000 mL | Freq: Once | INTRAVENOUS | Status: AC | PRN
  Administered 2021-10-24: 10 mL

## 2021-10-24 MED ORDER — HEPARIN SOD (PORK) LOCK FLUSH 100 UNIT/ML IV SOLN
500.0000 [IU] | Freq: Once | INTRAVENOUS | Status: AC | PRN
  Administered 2021-10-24: 500 [IU]

## 2021-10-24 MED ORDER — SODIUM CHLORIDE 0.9 % IV SOLN: Freq: Once | INTRAVENOUS | Status: AC

## 2021-10-24 NOTE — Patient Instructions (Signed)

## 2021-10-24 NOTE — Patient Instructions (Signed)

## 2021-10-26 DIAGNOSIS — I10 Essential (primary) hypertension: Secondary | ICD-10-CM | POA: Diagnosis not present

## 2021-10-26 DIAGNOSIS — E7800 Pure hypercholesterolemia, unspecified: Secondary | ICD-10-CM | POA: Diagnosis not present

## 2021-10-26 DIAGNOSIS — E119 Type 2 diabetes mellitus without complications: Secondary | ICD-10-CM | POA: Diagnosis not present

## 2021-10-28 ENCOUNTER — Inpatient Hospital Stay (HOSPITAL_BASED_OUTPATIENT_CLINIC_OR_DEPARTMENT_OTHER): Payer: Medicare Other | Admitting: Hematology & Oncology

## 2021-10-28 ENCOUNTER — Inpatient Hospital Stay: Payer: Medicare Other

## 2021-10-28 ENCOUNTER — Encounter: Payer: Self-pay | Admitting: Hematology & Oncology

## 2021-10-28 ENCOUNTER — Other Ambulatory Visit: Payer: Self-pay

## 2021-10-28 ENCOUNTER — Other Ambulatory Visit (HOSPITAL_BASED_OUTPATIENT_CLINIC_OR_DEPARTMENT_OTHER): Payer: Self-pay

## 2021-10-28 ENCOUNTER — Encounter: Payer: Self-pay | Admitting: *Deleted

## 2021-10-28 VITALS — BP 107/65 | HR 89 | Temp 98.5°F | Resp 18 | Ht 72.0 in | Wt 161.4 lb

## 2021-10-28 VITALS — BP 111/62 | HR 96 | Resp 18

## 2021-10-28 DIAGNOSIS — C8330 Diffuse large B-cell lymphoma, unspecified site: Secondary | ICD-10-CM | POA: Diagnosis not present

## 2021-10-28 DIAGNOSIS — Z5112 Encounter for antineoplastic immunotherapy: Secondary | ICD-10-CM | POA: Diagnosis not present

## 2021-10-28 DIAGNOSIS — C8338 Diffuse large B-cell lymphoma, lymph nodes of multiple sites: Secondary | ICD-10-CM

## 2021-10-28 DIAGNOSIS — M25552 Pain in left hip: Secondary | ICD-10-CM

## 2021-10-28 DIAGNOSIS — Z5111 Encounter for antineoplastic chemotherapy: Secondary | ICD-10-CM | POA: Diagnosis not present

## 2021-10-28 DIAGNOSIS — Z5189 Encounter for other specified aftercare: Secondary | ICD-10-CM | POA: Diagnosis not present

## 2021-10-28 DIAGNOSIS — Z794 Long term (current) use of insulin: Secondary | ICD-10-CM | POA: Diagnosis not present

## 2021-10-28 DIAGNOSIS — I714 Abdominal aortic aneurysm, without rupture, unspecified: Secondary | ICD-10-CM | POA: Diagnosis not present

## 2021-10-28 DIAGNOSIS — E1165 Type 2 diabetes mellitus with hyperglycemia: Secondary | ICD-10-CM | POA: Diagnosis not present

## 2021-10-28 DIAGNOSIS — S2249XS Multiple fractures of ribs, unspecified side, sequela: Secondary | ICD-10-CM

## 2021-10-28 LAB — CMP (CANCER CENTER ONLY)
ALT: 21 U/L (ref 0–44)
AST: 19 U/L (ref 15–41)
Albumin: 4 g/dL (ref 3.5–5.0)
Alkaline Phosphatase: 68 U/L (ref 38–126)
Anion gap: 9 (ref 5–15)
BUN: 24 mg/dL — ABNORMAL HIGH (ref 8–23)
CO2: 23 mmol/L (ref 22–32)
Calcium: 9.2 mg/dL (ref 8.9–10.3)
Chloride: 100 mmol/L (ref 98–111)
Creatinine: 0.84 mg/dL (ref 0.61–1.24)
GFR, Estimated: >60 mL/min (ref >60)
Glucose, Bld: 256 mg/dL — ABNORMAL HIGH (ref 70–99)
Potassium: 4.5 mmol/L (ref 3.5–5.1)
Sodium: 132 mmol/L — ABNORMAL LOW (ref 135–145)
Total Bilirubin: 0.5 mg/dL (ref 0.3–1.2)
Total Protein: 6.7 g/dL (ref 6.5–8.1)

## 2021-10-28 LAB — URINALYSIS, COMPLETE (UACMP) WITH MICROSCOPIC
Bilirubin Urine: NEGATIVE
Glucose, UA: NEGATIVE mg/dL
Hgb urine dipstick: NEGATIVE
Ketones, ur: NEGATIVE mg/dL
Nitrite: POSITIVE — AB
Protein, ur: NEGATIVE mg/dL
Specific Gravity, Urine: <=1.005 (ref 1.005–1.030)
pH: 5 (ref 5.0–8.0)

## 2021-10-28 LAB — CBC WITH DIFFERENTIAL (CANCER CENTER ONLY)
Abs Immature Granulocytes: 0.09 10*3/uL — ABNORMAL HIGH (ref 0.00–0.07)
Basophils Absolute: 0 10*3/uL (ref 0.0–0.1)
Basophils Relative: 0 %
Eosinophils Absolute: 0 10*3/uL (ref 0.0–0.5)
Eosinophils Relative: 0 %
HCT: 26.9 % — ABNORMAL LOW (ref 39.0–52.0)
Hemoglobin: 9 g/dL — ABNORMAL LOW (ref 13.0–17.0)
Immature Granulocytes: 2 %
Lymphocytes Relative: 21 %
Lymphs Abs: 1.1 10*3/uL (ref 0.7–4.0)
MCH: 31.8 pg (ref 26.0–34.0)
MCHC: 33.5 g/dL (ref 30.0–36.0)
MCV: 95.1 fL (ref 80.0–100.0)
Monocytes Absolute: 0.8 10*3/uL (ref 0.1–1.0)
Monocytes Relative: 16 %
Neutro Abs: 3.3 10*3/uL (ref 1.7–7.7)
Neutrophils Relative %: 61 %
Platelet Count: 122 10*3/uL — ABNORMAL LOW (ref 150–400)
RBC: 2.83 MIL/uL — ABNORMAL LOW (ref 4.22–5.81)
RDW: 19.1 % — ABNORMAL HIGH (ref 11.5–15.5)
WBC Count: 5.3 10*3/uL (ref 4.0–10.5)
nRBC: 0.4 % — ABNORMAL HIGH (ref 0.0–0.2)

## 2021-10-28 LAB — SAMPLE TO BLOOD BANK

## 2021-10-28 LAB — LACTATE DEHYDROGENASE: LDH: 185 U/L (ref 98–192)

## 2021-10-28 LAB — PREPARE RBC (CROSSMATCH)

## 2021-10-28 MED ORDER — SODIUM CHLORIDE 0.9% FLUSH
10.0000 mL | INTRAVENOUS | Status: DC | PRN
  Administered 2021-10-28: 10 mL

## 2021-10-28 MED ORDER — DIPHENHYDRAMINE HCL 25 MG PO CAPS
50.0000 mg | ORAL_CAPSULE | Freq: Once | ORAL | Status: AC
  Administered 2021-10-28: 50 mg via ORAL
  Filled 2021-10-28: qty 2

## 2021-10-28 MED ORDER — SODIUM CHLORIDE 0.9 % IV SOLN: Freq: Once | INTRAVENOUS | Status: AC

## 2021-10-28 MED ORDER — HEPARIN SOD (PORK) LOCK FLUSH 100 UNIT/ML IV SOLN
500.0000 [IU] | Freq: Once | INTRAVENOUS | Status: AC | PRN
  Administered 2021-10-28: 500 [IU]

## 2021-10-28 MED ORDER — SODIUM CHLORIDE 0.9 % IV SOLN
150.0000 mg | Freq: Once | INTRAVENOUS | Status: AC
  Administered 2021-10-28: 150 mg via INTRAVENOUS
  Filled 2021-10-28: qty 150

## 2021-10-28 MED ORDER — ACETAMINOPHEN 325 MG PO TABS
650.0000 mg | ORAL_TABLET | Freq: Once | ORAL | Status: AC
  Administered 2021-10-28: 650 mg via ORAL
  Filled 2021-10-28: qty 2

## 2021-10-28 MED ORDER — SODIUM CHLORIDE 0.9 % IV SOLN
10.0000 mg | Freq: Once | INTRAVENOUS | Status: AC
  Administered 2021-10-28: 10 mg via INTRAVENOUS
  Filled 2021-10-28: qty 10

## 2021-10-28 MED ORDER — SODIUM CHLORIDE 0.9 % IV SOLN
375.0000 mg/m2 | Freq: Once | INTRAVENOUS | Status: AC
  Administered 2021-10-28: 800 mg via INTRAVENOUS
  Filled 2021-10-28: qty 50

## 2021-10-28 MED ORDER — VINCRISTINE SULFATE CHEMO INJECTION 1 MG/ML
2.0000 mg | Freq: Once | INTRAVENOUS | Status: AC
  Administered 2021-10-28: 2 mg via INTRAVENOUS
  Filled 2021-10-28: qty 2

## 2021-10-28 MED ORDER — COLD PACK MISC ONCOLOGY: 1.0000 | Freq: Once | Status: DC | PRN

## 2021-10-28 MED ORDER — ZOLEDRONIC ACID 4 MG/100ML IV SOLN
4.0000 mg | Freq: Once | INTRAVENOUS | Status: AC
  Administered 2021-10-28: 4 mg via INTRAVENOUS
  Filled 2021-10-28: qty 100

## 2021-10-28 MED ORDER — SODIUM CHLORIDE 0.9 % IV SOLN
750.0000 mg/m2 | Freq: Once | INTRAVENOUS | Status: AC
  Administered 2021-10-28: 1600 mg via INTRAVENOUS
  Filled 2021-10-28: qty 80

## 2021-10-28 MED ORDER — HOT PACK MISC ONCOLOGY: 1.0000 | Freq: Once | Status: DC | PRN

## 2021-10-28 MED ORDER — DOXORUBICIN HCL CHEMO IV INJECTION 2 MG/ML
50.0000 mg/m2 | Freq: Once | INTRAVENOUS | Status: AC
  Administered 2021-10-28: 106 mg via INTRAVENOUS
  Filled 2021-10-28: qty 53

## 2021-10-28 MED ORDER — PALONOSETRON HCL INJECTION 0.25 MG/5ML
0.2500 mg | Freq: Once | INTRAVENOUS | Status: AC
  Administered 2021-10-28: 0.25 mg via INTRAVENOUS
  Filled 2021-10-28: qty 5

## 2021-10-31 ENCOUNTER — Inpatient Hospital Stay: Payer: Medicare Other

## 2021-10-31 VITALS — BP 132/72 | HR 78 | Temp 98.1°F | Resp 18

## 2021-10-31 DIAGNOSIS — I714 Abdominal aortic aneurysm, without rupture, unspecified: Secondary | ICD-10-CM | POA: Diagnosis not present

## 2021-10-31 DIAGNOSIS — Z794 Long term (current) use of insulin: Secondary | ICD-10-CM | POA: Diagnosis not present

## 2021-10-31 DIAGNOSIS — C8338 Diffuse large B-cell lymphoma, lymph nodes of multiple sites: Secondary | ICD-10-CM

## 2021-10-31 DIAGNOSIS — Z5112 Encounter for antineoplastic immunotherapy: Secondary | ICD-10-CM | POA: Diagnosis not present

## 2021-10-31 DIAGNOSIS — Z5189 Encounter for other specified aftercare: Secondary | ICD-10-CM | POA: Diagnosis not present

## 2021-10-31 DIAGNOSIS — Z5111 Encounter for antineoplastic chemotherapy: Secondary | ICD-10-CM | POA: Diagnosis not present

## 2021-10-31 DIAGNOSIS — C8330 Diffuse large B-cell lymphoma, unspecified site: Secondary | ICD-10-CM | POA: Diagnosis not present

## 2021-10-31 DIAGNOSIS — E1165 Type 2 diabetes mellitus with hyperglycemia: Secondary | ICD-10-CM | POA: Diagnosis not present

## 2021-10-31 MED ORDER — PEGFILGRASTIM-BMEZ 6 MG/0.6ML ~~LOC~~ SOSY
6.0000 mg | PREFILLED_SYRINGE | Freq: Once | SUBCUTANEOUS | Status: AC
  Administered 2021-10-31: 6 mg via SUBCUTANEOUS
  Filled 2021-10-31: qty 0.6

## 2021-10-31 MED ORDER — HEPARIN SOD (PORK) LOCK FLUSH 100 UNIT/ML IV SOLN
500.0000 [IU] | Freq: Every day | INTRAVENOUS | Status: AC | PRN
  Administered 2021-10-31: 500 [IU]

## 2021-10-31 MED ORDER — SODIUM CHLORIDE 0.9% FLUSH
10.0000 mL | INTRAVENOUS | Status: AC | PRN
  Administered 2021-10-31: 10 mL

## 2021-11-01 ENCOUNTER — Other Ambulatory Visit (HOSPITAL_BASED_OUTPATIENT_CLINIC_OR_DEPARTMENT_OTHER): Payer: Self-pay

## 2021-11-01 ENCOUNTER — Other Ambulatory Visit: Payer: Self-pay | Admitting: Family

## 2021-11-01 ENCOUNTER — Other Ambulatory Visit: Payer: Self-pay | Admitting: Hematology & Oncology

## 2021-11-01 DIAGNOSIS — N390 Urinary tract infection, site not specified: Secondary | ICD-10-CM

## 2021-11-01 LAB — BPAM RBC
Blood Product Expiration Date: 202307132359
Blood Product Expiration Date: 202307132359
ISSUE DATE / TIME: 202306260808
ISSUE DATE / TIME: 202306260808
Unit Type and Rh: 7300
Unit Type and Rh: 7300

## 2021-11-01 LAB — TYPE AND SCREEN
ABO/RH(D): B POS
Antibody Screen: NEGATIVE
Unit division: 0
Unit division: 0

## 2021-11-01 MED ORDER — NITROFURANTOIN MONOHYD MACRO 100 MG PO CAPS
100.0000 mg | ORAL_CAPSULE | Freq: Two times a day (BID) | ORAL | 0 refills | Status: DC
  Filled 2021-11-01: qty 14, 7d supply, fill #0

## 2021-11-01 MED ORDER — LORAZEPAM 0.5 MG PO TABS
0.5000 mg | ORAL_TABLET | Freq: Three times a day (TID) | ORAL | 1 refills | Status: DC
  Filled 2021-11-01: qty 30, 10d supply, fill #0
  Filled 2021-11-20: qty 30, 10d supply, fill #1

## 2021-11-04 ENCOUNTER — Inpatient Hospital Stay: Payer: Medicare Other

## 2021-11-04 ENCOUNTER — Other Ambulatory Visit: Payer: Self-pay

## 2021-11-04 VITALS — BP 118/84 | HR 86 | Temp 98.1°F | Resp 16

## 2021-11-04 DIAGNOSIS — Z960 Presence of urogenital implants: Secondary | ICD-10-CM | POA: Diagnosis not present

## 2021-11-04 DIAGNOSIS — Z794 Long term (current) use of insulin: Secondary | ICD-10-CM | POA: Diagnosis not present

## 2021-11-04 DIAGNOSIS — S2249XS Multiple fractures of ribs, unspecified side, sequela: Secondary | ICD-10-CM

## 2021-11-04 DIAGNOSIS — E782 Mixed hyperlipidemia: Secondary | ICD-10-CM | POA: Diagnosis not present

## 2021-11-04 DIAGNOSIS — E1100 Type 2 diabetes mellitus with hyperosmolarity without nonketotic hyperglycemic-hyperosmolar coma (NKHHC): Secondary | ICD-10-CM

## 2021-11-04 DIAGNOSIS — D709 Neutropenia, unspecified: Secondary | ICD-10-CM | POA: Diagnosis not present

## 2021-11-04 DIAGNOSIS — M25422 Effusion, left elbow: Secondary | ICD-10-CM | POA: Diagnosis not present

## 2021-11-04 DIAGNOSIS — G2581 Restless legs syndrome: Secondary | ICD-10-CM | POA: Diagnosis not present

## 2021-11-04 DIAGNOSIS — F419 Anxiety disorder, unspecified: Secondary | ICD-10-CM | POA: Diagnosis present

## 2021-11-04 DIAGNOSIS — E860 Dehydration: Secondary | ICD-10-CM | POA: Diagnosis not present

## 2021-11-04 DIAGNOSIS — T451X5A Adverse effect of antineoplastic and immunosuppressive drugs, initial encounter: Secondary | ICD-10-CM | POA: Diagnosis not present

## 2021-11-04 DIAGNOSIS — K760 Fatty (change of) liver, not elsewhere classified: Secondary | ICD-10-CM | POA: Diagnosis not present

## 2021-11-04 DIAGNOSIS — R509 Fever, unspecified: Secondary | ICD-10-CM | POA: Diagnosis present

## 2021-11-04 DIAGNOSIS — E785 Hyperlipidemia, unspecified: Secondary | ICD-10-CM | POA: Diagnosis not present

## 2021-11-04 DIAGNOSIS — R600 Localized edema: Secondary | ICD-10-CM | POA: Diagnosis not present

## 2021-11-04 DIAGNOSIS — I480 Paroxysmal atrial fibrillation: Secondary | ICD-10-CM | POA: Diagnosis not present

## 2021-11-04 DIAGNOSIS — D61810 Antineoplastic chemotherapy induced pancytopenia: Secondary | ICD-10-CM | POA: Diagnosis not present

## 2021-11-04 DIAGNOSIS — Z5189 Encounter for other specified aftercare: Secondary | ICD-10-CM | POA: Diagnosis not present

## 2021-11-04 DIAGNOSIS — L03114 Cellulitis of left upper limb: Secondary | ICD-10-CM | POA: Diagnosis not present

## 2021-11-04 DIAGNOSIS — D649 Anemia, unspecified: Secondary | ICD-10-CM

## 2021-11-04 DIAGNOSIS — I10 Essential (primary) hypertension: Secondary | ICD-10-CM | POA: Diagnosis not present

## 2021-11-04 DIAGNOSIS — E1165 Type 2 diabetes mellitus with hyperglycemia: Secondary | ICD-10-CM | POA: Diagnosis not present

## 2021-11-04 DIAGNOSIS — E872 Acidosis, unspecified: Secondary | ICD-10-CM | POA: Diagnosis not present

## 2021-11-04 DIAGNOSIS — I7143 Infrarenal abdominal aortic aneurysm, without rupture: Secondary | ICD-10-CM | POA: Diagnosis not present

## 2021-11-04 DIAGNOSIS — Z5111 Encounter for antineoplastic chemotherapy: Secondary | ICD-10-CM | POA: Diagnosis not present

## 2021-11-04 DIAGNOSIS — J449 Chronic obstructive pulmonary disease, unspecified: Secondary | ICD-10-CM | POA: Diagnosis not present

## 2021-11-04 DIAGNOSIS — R945 Abnormal results of liver function studies: Secondary | ICD-10-CM | POA: Diagnosis not present

## 2021-11-04 DIAGNOSIS — D61818 Other pancytopenia: Secondary | ICD-10-CM | POA: Diagnosis not present

## 2021-11-04 DIAGNOSIS — C8338 Diffuse large B-cell lymphoma, lymph nodes of multiple sites: Secondary | ICD-10-CM

## 2021-11-04 DIAGNOSIS — R6520 Severe sepsis without septic shock: Secondary | ICD-10-CM | POA: Diagnosis not present

## 2021-11-04 DIAGNOSIS — U071 COVID-19: Secondary | ICD-10-CM | POA: Diagnosis not present

## 2021-11-04 DIAGNOSIS — D701 Agranulocytosis secondary to cancer chemotherapy: Secondary | ICD-10-CM | POA: Diagnosis not present

## 2021-11-04 DIAGNOSIS — Z5112 Encounter for antineoplastic immunotherapy: Secondary | ICD-10-CM | POA: Diagnosis not present

## 2021-11-04 DIAGNOSIS — F32A Depression, unspecified: Secondary | ICD-10-CM | POA: Diagnosis not present

## 2021-11-04 DIAGNOSIS — Z228 Carrier of other infectious diseases: Secondary | ICD-10-CM | POA: Diagnosis not present

## 2021-11-04 DIAGNOSIS — N390 Urinary tract infection, site not specified: Secondary | ICD-10-CM | POA: Diagnosis not present

## 2021-11-04 DIAGNOSIS — K219 Gastro-esophageal reflux disease without esophagitis: Secondary | ICD-10-CM | POA: Diagnosis not present

## 2021-11-04 DIAGNOSIS — M25552 Pain in left hip: Secondary | ICD-10-CM

## 2021-11-04 DIAGNOSIS — R5081 Fever presenting with conditions classified elsewhere: Secondary | ICD-10-CM | POA: Diagnosis not present

## 2021-11-04 DIAGNOSIS — C8330 Diffuse large B-cell lymphoma, unspecified site: Secondary | ICD-10-CM | POA: Diagnosis not present

## 2021-11-04 DIAGNOSIS — R000 Tachycardia, unspecified: Secondary | ICD-10-CM | POA: Diagnosis not present

## 2021-11-04 DIAGNOSIS — R531 Weakness: Secondary | ICD-10-CM | POA: Diagnosis not present

## 2021-11-04 DIAGNOSIS — E119 Type 2 diabetes mellitus without complications: Secondary | ICD-10-CM | POA: Diagnosis not present

## 2021-11-04 DIAGNOSIS — A419 Sepsis, unspecified organism: Secondary | ICD-10-CM | POA: Diagnosis not present

## 2021-11-04 DIAGNOSIS — Z978 Presence of other specified devices: Secondary | ICD-10-CM | POA: Diagnosis not present

## 2021-11-04 DIAGNOSIS — I714 Abdominal aortic aneurysm, without rupture, unspecified: Secondary | ICD-10-CM | POA: Diagnosis not present

## 2021-11-04 LAB — CBC WITH DIFFERENTIAL (CANCER CENTER ONLY)
Abs Immature Granulocytes: 0 10*3/uL (ref 0.00–0.07)
Basophils Absolute: 0 10*3/uL (ref 0.0–0.1)
Basophils Relative: 0 %
Eosinophils Absolute: 0 10*3/uL (ref 0.0–0.5)
Eosinophils Relative: 0 %
HCT: 32.9 % — ABNORMAL LOW (ref 39.0–52.0)
Hemoglobin: 11.2 g/dL — ABNORMAL LOW (ref 13.0–17.0)
Lymphocytes Relative: 20 %
Lymphs Abs: 0.2 10*3/uL — ABNORMAL LOW (ref 0.7–4.0)
MCH: 30.4 pg (ref 26.0–34.0)
MCHC: 34 g/dL (ref 30.0–36.0)
MCV: 89.2 fL (ref 80.0–100.0)
Monocytes Absolute: 0 10*3/uL — ABNORMAL LOW (ref 0.1–1.0)
Monocytes Relative: 0 %
Neutro Abs: 1 10*3/uL — ABNORMAL LOW (ref 1.7–7.7)
Neutrophils Relative %: 80 %
Other: 50 %
Platelet Count: 60 10*3/uL — ABNORMAL LOW (ref 150–400)
RBC: 3.69 MIL/uL — ABNORMAL LOW (ref 4.22–5.81)
RDW: 16.6 % — ABNORMAL HIGH (ref 11.5–15.5)
Smear Review: NORMAL
WBC Count: 1.2 10*3/uL — ABNORMAL LOW (ref 4.0–10.5)
nRBC: 0 % (ref 0.0–0.2)

## 2021-11-04 LAB — CMP (CANCER CENTER ONLY)
ALT: 38 U/L (ref 0–44)
AST: 17 U/L (ref 15–41)
Albumin: 3.8 g/dL (ref 3.5–5.0)
Alkaline Phosphatase: 65 U/L (ref 38–126)
Anion gap: 8 (ref 5–15)
BUN: 23 mg/dL (ref 8–23)
CO2: 26 mmol/L (ref 22–32)
Calcium: 9.1 mg/dL (ref 8.9–10.3)
Chloride: 97 mmol/L — ABNORMAL LOW (ref 98–111)
Creatinine: 0.75 mg/dL (ref 0.61–1.24)
GFR, Estimated: >60 mL/min (ref >60)
Glucose, Bld: 368 mg/dL — ABNORMAL HIGH (ref 70–99)
Potassium: 4.8 mmol/L (ref 3.5–5.1)
Sodium: 131 mmol/L — ABNORMAL LOW (ref 135–145)
Total Bilirubin: 0.7 mg/dL (ref 0.3–1.2)
Total Protein: 6.2 g/dL — ABNORMAL LOW (ref 6.5–8.1)

## 2021-11-04 LAB — SAMPLE TO BLOOD BANK

## 2021-11-04 MED ORDER — SODIUM CHLORIDE 0.9% FLUSH
10.0000 mL | Freq: Once | INTRAVENOUS | Status: AC
  Administered 2021-11-04: 10 mL via INTRAVENOUS

## 2021-11-04 MED ORDER — INSULIN ASPART 100 UNIT/ML IJ SOLN
15.0000 [IU] | Freq: Once | INTRAMUSCULAR | Status: AC
  Administered 2021-11-04: 15 [IU] via SUBCUTANEOUS
  Filled 2021-11-04: qty 0.15

## 2021-11-04 MED ORDER — INSULIN ASPART 100 UNIT/ML IJ SOLN
15.0000 [IU] | Freq: Once | INTRAMUSCULAR | Status: DC
  Filled 2021-11-04: qty 0.15

## 2021-11-04 MED ORDER — HEPARIN SOD (PORK) LOCK FLUSH 100 UNIT/ML IV SOLN: 500.0000 [IU] | Freq: Once | INTRAVENOUS | Status: DC | PRN

## 2021-11-04 MED ORDER — HEPARIN SOD (PORK) LOCK FLUSH 100 UNIT/ML IV SOLN: 250.0000 [IU] | Freq: Once | INTRAVENOUS | Status: DC | PRN

## 2021-11-04 MED ORDER — KETOROLAC TROMETHAMINE 15 MG/ML IJ SOLN: 30.0000 mg | Freq: Once | INTRAMUSCULAR | Status: AC

## 2021-11-04 MED ORDER — SODIUM CHLORIDE 0.9 % IV SOLN: Freq: Once | INTRAVENOUS | Status: AC

## 2021-11-04 MED ORDER — KETOROLAC TROMETHAMINE 15 MG/ML IJ SOLN: 30.0000 mg | Freq: Once | INTRAMUSCULAR | Status: DC

## 2021-11-04 MED ORDER — SODIUM CHLORIDE 0.9% FLUSH: 3.0000 mL | Freq: Once | INTRAVENOUS | Status: DC | PRN

## 2021-11-04 MED ORDER — HEPARIN SOD (PORK) LOCK FLUSH 100 UNIT/ML IV SOLN
500.0000 [IU] | Freq: Once | INTRAVENOUS | Status: AC
  Administered 2021-11-04: 500 [IU] via INTRAVENOUS

## 2021-11-04 MED ORDER — ALTEPLASE 2 MG IJ SOLR: 2.0000 mg | Freq: Once | INTRAMUSCULAR | Status: DC | PRN

## 2021-11-04 MED ORDER — KETOROLAC TROMETHAMINE 15 MG/ML IJ SOLN
INTRAMUSCULAR | Status: AC
  Administered 2021-11-04: 30 mg via INTRAVENOUS
  Filled 2021-11-04: qty 2

## 2021-11-04 MED ORDER — SODIUM CHLORIDE 0.9% FLUSH: 10.0000 mL | Freq: Once | INTRAVENOUS | Status: DC | PRN

## 2021-11-04 NOTE — Patient Instructions (Signed)

## 2021-11-04 NOTE — Progress Notes (Signed)
Pt complained of left elbow redness, warm to touch and inflammation. MD aaware. Patient currently on bactrim orally d/t foley. Order for IV toradol.  Glucose high, pt states it was lower this morning so he did not take his insulin.  Order for 15 units of regular insulin while pt is here.   Orders placed

## 2021-11-05 ENCOUNTER — Inpatient Hospital Stay (HOSPITAL_COMMUNITY)
Admission: EM | Admit: 2021-11-05 | Discharge: 2021-11-10 | DRG: 808 | Disposition: A | Discharge status: Home / Self Care | Payer: Medicare Other | Attending: Internal Medicine | Admitting: Internal Medicine

## 2021-11-05 ENCOUNTER — Encounter (HOSPITAL_COMMUNITY): Payer: Self-pay

## 2021-11-05 ENCOUNTER — Emergency Department (HOSPITAL_COMMUNITY): Payer: Medicare Other

## 2021-11-05 ENCOUNTER — Other Ambulatory Visit: Payer: Self-pay

## 2021-11-05 DIAGNOSIS — D61810 Antineoplastic chemotherapy induced pancytopenia: Secondary | ICD-10-CM | POA: Diagnosis present

## 2021-11-05 DIAGNOSIS — F419 Anxiety disorder, unspecified: Secondary | ICD-10-CM | POA: Diagnosis present

## 2021-11-05 DIAGNOSIS — J449 Chronic obstructive pulmonary disease, unspecified: Secondary | ICD-10-CM | POA: Diagnosis not present

## 2021-11-05 DIAGNOSIS — F32A Depression, unspecified: Secondary | ICD-10-CM | POA: Diagnosis not present

## 2021-11-05 DIAGNOSIS — E119 Type 2 diabetes mellitus without complications: Secondary | ICD-10-CM

## 2021-11-05 DIAGNOSIS — E782 Mixed hyperlipidemia: Secondary | ICD-10-CM

## 2021-11-05 DIAGNOSIS — Z880 Allergy status to penicillin: Secondary | ICD-10-CM

## 2021-11-05 DIAGNOSIS — U071 COVID-19: Secondary | ICD-10-CM | POA: Diagnosis present

## 2021-11-05 DIAGNOSIS — R6520 Severe sepsis without septic shock: Secondary | ICD-10-CM | POA: Diagnosis present

## 2021-11-05 DIAGNOSIS — D701 Agranulocytosis secondary to cancer chemotherapy: Principal | ICD-10-CM | POA: Diagnosis present

## 2021-11-05 DIAGNOSIS — K219 Gastro-esophageal reflux disease without esophagitis: Secondary | ICD-10-CM | POA: Diagnosis not present

## 2021-11-05 DIAGNOSIS — Z7982 Long term (current) use of aspirin: Secondary | ICD-10-CM

## 2021-11-05 DIAGNOSIS — Z978 Presence of other specified devices: Secondary | ICD-10-CM | POA: Diagnosis not present

## 2021-11-05 DIAGNOSIS — E860 Dehydration: Secondary | ICD-10-CM | POA: Diagnosis present

## 2021-11-05 DIAGNOSIS — G2581 Restless legs syndrome: Secondary | ICD-10-CM | POA: Diagnosis present

## 2021-11-05 DIAGNOSIS — I10 Essential (primary) hypertension: Secondary | ICD-10-CM | POA: Diagnosis not present

## 2021-11-05 DIAGNOSIS — Z79899 Other long term (current) drug therapy: Secondary | ICD-10-CM

## 2021-11-05 DIAGNOSIS — Z8614 Personal history of Methicillin resistant Staphylococcus aureus infection: Secondary | ICD-10-CM

## 2021-11-05 DIAGNOSIS — Z888 Allergy status to other drugs, medicaments and biological substances status: Secondary | ICD-10-CM

## 2021-11-05 DIAGNOSIS — Z792 Long term (current) use of antibiotics: Secondary | ICD-10-CM

## 2021-11-05 DIAGNOSIS — N390 Urinary tract infection, site not specified: Secondary | ICD-10-CM | POA: Diagnosis present

## 2021-11-05 DIAGNOSIS — E785 Hyperlipidemia, unspecified: Secondary | ICD-10-CM | POA: Diagnosis present

## 2021-11-05 DIAGNOSIS — I480 Paroxysmal atrial fibrillation: Secondary | ICD-10-CM | POA: Diagnosis not present

## 2021-11-05 DIAGNOSIS — Z8249 Family history of ischemic heart disease and other diseases of the circulatory system: Secondary | ICD-10-CM

## 2021-11-05 DIAGNOSIS — Z8619 Personal history of other infectious and parasitic diseases: Secondary | ICD-10-CM

## 2021-11-05 DIAGNOSIS — K760 Fatty (change of) liver, not elsewhere classified: Secondary | ICD-10-CM | POA: Diagnosis present

## 2021-11-05 DIAGNOSIS — D709 Neutropenia, unspecified: Secondary | ICD-10-CM

## 2021-11-05 DIAGNOSIS — T451X5A Adverse effect of antineoplastic and immunosuppressive drugs, initial encounter: Secondary | ICD-10-CM | POA: Diagnosis present

## 2021-11-05 DIAGNOSIS — Z87891 Personal history of nicotine dependence: Secondary | ICD-10-CM

## 2021-11-05 DIAGNOSIS — I714 Abdominal aortic aneurysm, without rupture, unspecified: Secondary | ICD-10-CM | POA: Diagnosis present

## 2021-11-05 DIAGNOSIS — Z800 Family history of malignant neoplasm of digestive organs: Secondary | ICD-10-CM

## 2021-11-05 DIAGNOSIS — A419 Sepsis, unspecified organism: Secondary | ICD-10-CM | POA: Diagnosis present

## 2021-11-05 DIAGNOSIS — C8338 Diffuse large B-cell lymphoma, lymph nodes of multiple sites: Secondary | ICD-10-CM | POA: Diagnosis present

## 2021-11-05 DIAGNOSIS — R7989 Other specified abnormal findings of blood chemistry: Secondary | ICD-10-CM | POA: Diagnosis present

## 2021-11-05 DIAGNOSIS — Z794 Long term (current) use of insulin: Secondary | ICD-10-CM

## 2021-11-05 DIAGNOSIS — R5081 Fever presenting with conditions classified elsewhere: Secondary | ICD-10-CM | POA: Diagnosis present

## 2021-11-05 DIAGNOSIS — L03114 Cellulitis of left upper limb: Secondary | ICD-10-CM | POA: Diagnosis present

## 2021-11-05 DIAGNOSIS — Z95828 Presence of other vascular implants and grafts: Secondary | ICD-10-CM

## 2021-11-05 DIAGNOSIS — Z228 Carrier of other infectious diseases: Secondary | ICD-10-CM

## 2021-11-05 DIAGNOSIS — Z7983 Long term (current) use of bisphosphonates: Secondary | ICD-10-CM

## 2021-11-05 DIAGNOSIS — D61818 Other pancytopenia: Secondary | ICD-10-CM | POA: Diagnosis not present

## 2021-11-05 DIAGNOSIS — M25422 Effusion, left elbow: Secondary | ICD-10-CM | POA: Diagnosis present

## 2021-11-05 DIAGNOSIS — E1165 Type 2 diabetes mellitus with hyperglycemia: Secondary | ICD-10-CM | POA: Diagnosis present

## 2021-11-05 DIAGNOSIS — E872 Acidosis, unspecified: Secondary | ICD-10-CM | POA: Diagnosis present

## 2021-11-05 HISTORY — DX: Cutaneous abscess, unspecified: L02.91

## 2021-11-05 HISTORY — DX: Acute kidney failure, unspecified: N17.9

## 2021-11-05 LAB — CBC WITH DIFFERENTIAL/PLATELET
Abs Immature Granulocytes: 0.02 10*3/uL (ref 0.00–0.07)
Basophils Absolute: 0 10*3/uL (ref 0.0–0.1)
Basophils Relative: 4 %
Eosinophils Absolute: 0 10*3/uL (ref 0.0–0.5)
Eosinophils Relative: 0 %
HCT: 31.6 % — ABNORMAL LOW (ref 39.0–52.0)
Hemoglobin: 10.8 g/dL — ABNORMAL LOW (ref 13.0–17.0)
Immature Granulocytes: 8 %
Lymphocytes Relative: 55 %
Lymphs Abs: 0.1 10*3/uL — ABNORMAL LOW (ref 0.7–4.0)
MCH: 30.8 pg (ref 26.0–34.0)
MCHC: 34.2 g/dL (ref 30.0–36.0)
MCV: 90 fL (ref 80.0–100.0)
Monocytes Absolute: 0.1 10*3/uL (ref 0.1–1.0)
Monocytes Relative: 29 %
Neutro Abs: 0 10*3/uL — CL (ref 1.7–7.7)
Neutrophils Relative %: 4 %
Platelets: 37 10*3/uL — ABNORMAL LOW (ref 150–400)
RBC: 3.51 MIL/uL — ABNORMAL LOW (ref 4.22–5.81)
RDW: 16 % — ABNORMAL HIGH (ref 11.5–15.5)
WBC: 0.2 10*3/uL — CL (ref 4.0–10.5)
nRBC: 0 % (ref 0.0–0.2)

## 2021-11-05 LAB — COMPREHENSIVE METABOLIC PANEL
ALT: 28 U/L (ref 0–44)
AST: 16 U/L (ref 15–41)
Albumin: 3.4 g/dL — ABNORMAL LOW (ref 3.5–5.0)
Alkaline Phosphatase: 58 U/L (ref 38–126)
Anion gap: 11 (ref 5–15)
BUN: 23 mg/dL (ref 8–23)
CO2: 21 mmol/L — ABNORMAL LOW (ref 22–32)
Calcium: 8.8 mg/dL — ABNORMAL LOW (ref 8.9–10.3)
Chloride: 100 mmol/L (ref 98–111)
Creatinine, Ser: 0.79 mg/dL (ref 0.61–1.24)
GFR, Estimated: >60 mL/min (ref >60)
Glucose, Bld: 259 mg/dL — ABNORMAL HIGH (ref 70–99)
Potassium: 4.8 mmol/L (ref 3.5–5.1)
Sodium: 132 mmol/L — ABNORMAL LOW (ref 135–145)
Total Bilirubin: 1.1 mg/dL (ref 0.3–1.2)
Total Protein: 6.7 g/dL (ref 6.5–8.1)

## 2021-11-05 LAB — URINALYSIS, ROUTINE W REFLEX MICROSCOPIC: Specific Gravity, Urine: 1.015 (ref 1.005–1.030)

## 2021-11-05 LAB — LACTIC ACID, PLASMA: Lactic Acid, Venous: 2 mmol/L (ref 0.5–1.9)

## 2021-11-05 LAB — CBG MONITORING, ED: Glucose-Capillary: 285 mg/dL — ABNORMAL HIGH (ref 70–99)

## 2021-11-05 LAB — PROTIME-INR
INR: 1 (ref 0.8–1.2)
Prothrombin Time: 13.1 seconds (ref 11.4–15.2)

## 2021-11-05 MED ORDER — LACTATED RINGERS IV BOLUS (SEPSIS)
1000.0000 mL | Freq: Once | INTRAVENOUS | Status: AC
  Administered 2021-11-05: 1000 mL via INTRAVENOUS

## 2021-11-05 MED ORDER — SODIUM CHLORIDE 0.9 % IV SOLN
2.0000 g | Freq: Once | INTRAVENOUS | Status: AC
  Administered 2021-11-05: 2 g via INTRAVENOUS
  Filled 2021-11-05: qty 12.5

## 2021-11-05 MED ORDER — ALBUTEROL SULFATE (2.5 MG/3ML) 0.083% IN NEBU: 3.0000 mL | INHALATION_SOLUTION | Freq: Four times a day (QID) | RESPIRATORY_TRACT | Status: DC | PRN

## 2021-11-05 MED ORDER — ACETAMINOPHEN 325 MG PO TABS
650.0000 mg | ORAL_TABLET | Freq: Four times a day (QID) | ORAL | Status: DC | PRN
  Administered 2021-11-06 – 2021-11-10 (×5): 650 mg via ORAL
  Filled 2021-11-05 (×5): qty 2

## 2021-11-05 MED ORDER — VANCOMYCIN HCL 1500 MG/300ML IV SOLN
1500.0000 mg | Freq: Once | INTRAVENOUS | Status: AC
  Administered 2021-11-05: 1500 mg via INTRAVENOUS
  Filled 2021-11-05: qty 300

## 2021-11-05 MED ORDER — MORPHINE SULFATE (PF) 4 MG/ML IV SOLN
4.0000 mg | Freq: Once | INTRAVENOUS | Status: AC
  Administered 2021-11-05: 4 mg via INTRAVENOUS
  Filled 2021-11-05: qty 1

## 2021-11-05 MED ORDER — VALACYCLOVIR HCL 500 MG PO TABS
1000.0000 mg | ORAL_TABLET | Freq: Every day | ORAL | Status: DC
  Administered 2021-11-06 – 2021-11-10 (×5): 1000 mg via ORAL
  Filled 2021-11-05 (×5): qty 2

## 2021-11-05 MED ORDER — PANTOPRAZOLE SODIUM 40 MG PO TBEC
40.0000 mg | DELAYED_RELEASE_TABLET | Freq: Every day | ORAL | Status: DC
  Administered 2021-11-06 – 2021-11-10 (×5): 40 mg via ORAL
  Filled 2021-11-05 (×5): qty 1

## 2021-11-05 MED ORDER — ACETAMINOPHEN 650 MG RE SUPP: 650.0000 mg | Freq: Four times a day (QID) | RECTAL | Status: DC | PRN

## 2021-11-05 MED ORDER — SODIUM CHLORIDE 0.9 % IV BOLUS
1000.0000 mL | Freq: Once | INTRAVENOUS | Status: AC
  Administered 2021-11-05: 1000 mL via INTRAVENOUS

## 2021-11-05 MED ORDER — ROSUVASTATIN CALCIUM 10 MG PO TABS
10.0000 mg | ORAL_TABLET | Freq: Every day | ORAL | Status: DC
Start: 2021-11-06 — End: 2021-11-09
  Administered 2021-11-06 – 2021-11-08 (×3): 10 mg via ORAL
  Filled 2021-11-05 (×3): qty 1

## 2021-11-05 MED ORDER — SODIUM CHLORIDE 0.9 % IV SOLN
1.0000 g | Freq: Once | INTRAVENOUS | Status: AC
  Administered 2021-11-06: 1 g via INTRAVENOUS
  Filled 2021-11-05: qty 20

## 2021-11-05 MED ORDER — LACTATED RINGERS IV SOLN: INTRAVENOUS | Status: DC

## 2021-11-05 MED ORDER — LORAZEPAM 0.5 MG PO TABS
0.5000 mg | ORAL_TABLET | Freq: Three times a day (TID) | ORAL | Status: DC
  Administered 2021-11-06 – 2021-11-10 (×14): 0.5 mg via ORAL
  Filled 2021-11-05 (×14): qty 1

## 2021-11-05 MED ORDER — ACETAMINOPHEN 500 MG PO TABS
1000.0000 mg | ORAL_TABLET | Freq: Once | ORAL | Status: AC
  Administered 2021-11-05: 1000 mg via ORAL
  Filled 2021-11-05: qty 2

## 2021-11-05 MED ORDER — TAMSULOSIN HCL 0.4 MG PO CAPS
0.4000 mg | ORAL_CAPSULE | Freq: Every evening | ORAL | Status: DC
  Administered 2021-11-06 – 2021-11-09 (×4): 0.4 mg via ORAL
  Filled 2021-11-05 (×4): qty 1

## 2021-11-05 MED ORDER — POLYETHYLENE GLYCOL 3350 17 G PO PACK: 17.0000 g | PACK | Freq: Every day | ORAL | Status: DC | PRN

## 2021-11-05 MED ORDER — SODIUM CHLORIDE 0.9% FLUSH
3.0000 mL | Freq: Two times a day (BID) | INTRAVENOUS | Status: DC
Start: 2021-11-06 — End: 2021-11-10
  Administered 2021-11-06 – 2021-11-09 (×7): 3 mL via INTRAVENOUS

## 2021-11-05 MED ORDER — LACTATED RINGERS IV BOLUS (SEPSIS)
250.0000 mL | Freq: Once | INTRAVENOUS | Status: AC
  Administered 2021-11-06: 250 mL via INTRAVENOUS

## 2021-11-05 MED ORDER — DULOXETINE HCL 60 MG PO CPEP
60.0000 mg | ORAL_CAPSULE | Freq: Every day | ORAL | Status: DC
  Administered 2021-11-06 – 2021-11-09 (×5): 60 mg via ORAL
  Filled 2021-11-05: qty 2
  Filled 2021-11-05 (×4): qty 1

## 2021-11-05 NOTE — Sepsis Progress Note (Signed)
[facility] following for Sepsis Protocol 

## 2021-11-05 NOTE — ED Triage Notes (Addendum)
Pt reports with generalized body aches and weakness since today. Pt reports having a foley catheter in place and is currently on abts. Pt states that he has been septic before and this is what it felt like last time.

## 2021-11-05 NOTE — ED Notes (Signed)
CRITICAL VALUE STICKER  CRITICAL VALUE: WBC 0.2  RECEIVER (on-site recipient of call): Guadelupe Sabin, RN  [facility] NOTIFIED: 11/05/21 2140   MD NOTIFIED: Dr. Gilford Raid  TIME OF NOTIFICATION: 2145  RESPONSE:  continue sepsis protocol

## 2021-11-05 NOTE — Progress Notes (Addendum)
A consult was received from an ED physician for aztreonam per pharmacy dosing.  The patient's profile has been reviewed for ht/wt/allergies/indication/available labs.   A one time order has been placed for cefepime 2 gm IV x 1.  He has received cefazolin & ceftriaxone & cefepime  in the past  Further antibiotics/pharmacy consults should be ordered by admitting physician if indicated.                       Thank you,  Eudelia Bunch, Pharm.D 11/05/2021 10:42 PM   Addendum: Hx ESBL Klebsiella ornithinolytica from 09/19/21 & L elbow w/ cellulitis  Plan: Meropenem 1 gm x 1 - to cover ESBL UTI Vancomycin 1500 mg IV x 1 to cover cellutlits  Eudelia Bunch, Pharm.D 11/05/2021 11:35 PM

## 2021-11-05 NOTE — ED Notes (Signed)
Two Golds, and 2 pink tubs sent down

## 2021-11-05 NOTE — ED Provider Notes (Signed)
[place] DEPT Provider Note   CSN: [id number] Arrival date & time: 11/05/21  2024     History  Chief Complaint  Patient presents with   Generalized Body Aches   UTI    Aaron RATLEDGE Sr. is a 66 y.o. male.  Pt is a 66 yo male with a pmhx significant for diffuse large B-cell lumphoma (followed by Dr. Marin Olp; last chemo was on 6/23), DM2, COPD, AAA, GERD, HLD, depression, anxiety, and htn.  Pt did receive 2 units of blood and pegfilgrastim-bmez on 6/26.  Pt has an indwelling foley and is on both bactrim and macrobid for uti suppression.  Pt said he's had body aches and weakness today.  He has been septic in the past and feels like he is again.  Pt has also noticed some redness to his left elbow.  He took an allopurinol this morning and it feels much better.         Home Medications Prior to Admission medications   Medication Sig Start Date End Date Taking? Authorizing Provider  acetaminophen (TYLENOL) 325 MG tablet Take 650 mg by mouth every 6 (six) hours as needed for mild pain or headache.   Yes [provider]  albuterol (PROAIR HFA) 108 (90 Base) MCG/ACT inhaler Inhale 1-2 puffs into the lungs every 6 (six) hours as needed for wheezing or shortness of breath. 10/15/20  Yes Collene Gobble, MD  alendronate (FOSAMAX) 70 MG tablet Take 70 mg by mouth every Sunday. 02/12/20  Yes [provider]  allopurinol (ZYLOPRIM) 100 MG tablet Take 1 tablet (100 mg total) by mouth daily. Start 3 days BEFORE chemotherapy starts 10/21/21  Yes [name], MD  aspirin EC 81 MG EC tablet Take 1 tablet (81 mg total) by mouth daily at 6 (six) AM. Swallow whole. Patient taking differently: Take 81 mg by mouth in the morning. Swallow whole. 07/14/20  Yes Ulyses Amor, PA-C  atenolol (TENORMIN) 100 MG tablet Take 100 mg by mouth daily. 09/02/20  Yes [provider]  calcium carbonate (OSCAL) 1500 (600 Ca) MG TABS tablet Take 600 mg of elemental  calcium by mouth daily with breakfast.   Yes [provider]  diclofenac Sodium (VOLTAREN) 1 % GEL Apply 2 g topically daily as needed (to painful sites).   Yes [provider]  DULoxetine (CYMBALTA) 60 MG capsule Take 60 mg by mouth at bedtime. 04/01/18  Yes [provider]  famciclovir (FAMVIR) 250 MG tablet Take 1 tablet (250 mg total) by mouth 2 (two) times daily. 06/09/21  Yes [name], MD  Fish Oil OIL Take 1,500 mg by mouth daily.   Yes [provider]  insulin glargine (LANTUS) 100 UNIT/ML Solostar Pen Inject 25 Units into the skin daily. 06/30/21  Yes Hosie Poisson, MD  insulin lispro (HUMALOG) 100 UNIT/ML KwikPen Inject 2-15 Units into the skin 4 (four) times daily - after meals and at bedtime. Glucose 121 - 150: 2 units, Glucose 151 - 200: 3 units, Glucose 201 - 250: 5 units, Glucose 251 - 300: 8 units, Glucose 301 - 350: 11 units, Glucose 351 - 400: 15 units, Glucose > 400 call MD 06/08/21  Yes Dwyane Dee, MD  LORazepam (ATIVAN) 0.5 MG tablet Take 1 tablet (0.5 mg total) by mouth every 8 (eight) hours. Take for nausea as needed 11/01/21  Yes Ennever, Rudell Cobb, MD  Multiple Vitamin (MULTIVITAMIN ADULT PO) Take 1 tablet by mouth daily.   Yes  [provider]  nitrofurantoin, macrocrystal-monohydrate, (MACROBID) 100 MG capsule Take 1 capsule (100 mg total) by mouth 2 (two) times daily. 11/01/21  Yes Celso Amy, NP  omeprazole (PRILOSEC) 40 MG capsule Take 40 mg by mouth every evening.   Yes [provider]  potassium chloride SA (KLOR-CON M20) 20 MEQ tablet TAKE 1 TABLET (20 MEQ TOTAL) BY MOUTH DAILY.  Patient taking differently: Take 20 mEq by mouth in the morning. 02/13/13  Yes Plotnikov, Evie Lacks, MD  rosuvastatin (CRESTOR) 10 MG tablet Take 10 mg by mouth daily. 03/16/20  Yes [provider]  sulfamethoxazole-trimethoprim (BACTRIM DS) 800-160 MG tablet Take 1 tablet by mouth daily. 08/26/21  Yes Ennever, Rudell Cobb, MD   tadalafil (CIALIS) 5 MG tablet Take 5 mg by mouth in the morning.   Yes [provider]  tamsulosin (FLOMAX) 0.4 MG CAPS capsule Take 0.4 mg by mouth every evening.   Yes [provider]  Zinc 50 MG TABS Take 50 mg by mouth daily.   Yes [provider]  blood glucose meter kit and supplies KIT Dispense based on patient and insurance preference. Use up to four times daily as directed. 06/08/21   Dwyane Dee, MD  Continuous Blood Gluc Receiver (FREESTYLE LIBRE 2 READER) [place]  06/16/21   [provider]  Continuous Blood Gluc Sensor (FREESTYLE LIBRE 14 DAY SENSOR) MISC Inject 1 Device into the skin every 14 (fourteen) days.    [provider]  fluconazole (DIFLUCAN) 100 MG tablet Take 1 tablet (100 mg total) by mouth daily. Patient not taking: Reported on 11/05/2021 10/20/21   [name], MD  Insulin Pen Needle 32G X 4 MM MISC 1 each by Does not apply route 4 (four) times daily - after meals and at bedtime. 06/08/21   Dwyane Dee, MD  predniSONE (DELTASONE) 20 MG tablet Take 60 mg by mouth daily. Patient not taking: Reported on 11/05/2021 08/23/21   [provider]  prochlorperazine (COMPAZINE) 10 MG tablet Take 1 tablet (10 mg total) by mouth every 6 (six) hours as needed (Nausea or vomiting). 05/17/21 11/04/21  [name], MD      Allergies    Fexofenadine, Penicillins, Pravastatin sodium, Requip [ropinirole hcl], and Hydrochlorothiazide    Review of Systems   Review of Systems  Genitourinary:  Positive for dysuria.  Neurological:  Positive for weakness.  All other systems reviewed and are negative.   Physical Exam Updated Vital Signs BP 116/76   Pulse (!) 109   Temp (!) 102 F (38.9 C) (Axillary) Comment: encouraged blanket removal  Resp (!) 26   Ht '5\' 11"'  (1.803 m)   Wt 72.6 kg   SpO2 96%   BMI 22.32 kg/m  Physical Exam Vitals and nursing note reviewed.  Constitutional:      Appearance: Normal appearance. He is  ill-appearing.  HENT:     Head: Normocephalic and atraumatic.     Right Ear: External ear normal.     Left Ear: External ear normal.     Nose: Nose normal.     Mouth/Throat:     Mouth: Mucous membranes are dry.  Eyes:     Extraocular Movements: Extraocular movements intact.     Conjunctiva/sclera: Conjunctivae normal.     Pupils: Pupils are equal, round, and reactive to light.  Cardiovascular:     Rate and Rhythm: Regular rhythm. Tachycardia present.     Pulses: Normal pulses.     Heart sounds: Normal heart sounds.  Pulmonary:     Effort: Pulmonary effort is normal.     Breath sounds: Normal breath sounds.  Abdominal:     General: Abdomen is flat. Bowel sounds are normal.     Palpations: Abdomen is soft.  Genitourinary:    Comments: Indwelling foley Musculoskeletal:        General: Normal range of motion.     Cervical back: Normal range of motion and neck supple.  Skin:    Capillary Refill: Capillary refill takes less than 2 seconds.     Comments: Redness to left elbow, but good rom.  Mild left hand swelling.  Neurological:     General: No focal deficit present.     Mental Status: He is alert and oriented to person, place, and time.  Psychiatric:        Mood and Affect: Mood normal.        Behavior: Behavior normal.     ED Results / Procedures / Treatments   Labs (all labs ordered are listed, but only abnormal results are displayed) Labs Reviewed  COMPREHENSIVE METABOLIC PANEL - Abnormal; Notable for the following components:      Result Value   Sodium 132 (*)    CO2 21 (*)    Glucose, Bld 259 (*)    Calcium 8.8 (*)    Albumin 3.4 (*)    All other components within normal limits  LACTIC ACID, PLASMA - Abnormal; Notable for the following components:   Lactic Acid, Venous 2.0 (*)    All other components within normal limits  CBC WITH DIFFERENTIAL/PLATELET - Abnormal; Notable for the following components:   WBC 0.2 (*)    RBC 3.51 (*)    Hemoglobin 10.8 (*)     HCT 31.6 (*)    RDW 16.0 (*)    Platelets 37 (*)    Neutro Abs 0.0 (*)    Lymphs Abs 0.1 (*)    All other components within normal limits  URINALYSIS, ROUTINE W REFLEX MICROSCOPIC - Abnormal; Notable for the following components:   Color, Urine RED (*)    APPearance CLOUDY (*)    Glucose, UA   (*)    Value: TEST NOT REPORTED DUE TO COLOR INTERFERENCE OF URINE PIGMENT   Hgb urine dipstick   (*)    Value: TEST NOT REPORTED DUE TO COLOR INTERFERENCE OF URINE PIGMENT   Bilirubin Urine   (*)    Value: TEST NOT REPORTED DUE TO COLOR INTERFERENCE OF URINE PIGMENT   Ketones, ur   (*)    Value: TEST NOT REPORTED DUE TO COLOR INTERFERENCE OF URINE PIGMENT   Protein, ur   (*)    Value: TEST NOT REPORTED DUE TO COLOR INTERFERENCE OF URINE PIGMENT   Nitrite   (*)    Value: TEST NOT REPORTED DUE TO COLOR INTERFERENCE OF URINE PIGMENT   Leukocytes,Ua   (*)    Value: TEST NOT REPORTED DUE TO COLOR INTERFERENCE OF URINE PIGMENT   Bacteria, UA MANY (*)    All other components within normal limits  CBG MONITORING, ED - Abnormal; Notable for the following components:   Glucose-Capillary 285 (*)    All other components within normal limits  CULTURE, BLOOD (ROUTINE X 2)  CULTURE, BLOOD (ROUTINE X 2)  URINE CULTURE  RESP PANEL BY RT-PCR (FLU A&B, COVID) ARPGX2  PROTIME-INR  LACTIC ACID, PLASMA    EKG None  Radiology DG Chest 2 View  Result Date: 11/05/2021 CLINICAL DATA:  Generalized weakness EXAM:  CHEST - 2 VIEW COMPARISON:  08/13/2021 FINDINGS: Cardiac shadow is stable. Right chest wall port is stable. Linear scarring in the left base is again noted. No sizable effusion is seen. Changes of prior vertebral augmentation are noted. IMPRESSION: Left basilar scarring. Electronically Signed   By: Inez Catalina M.D.   On: 11/05/2021 20:55    Procedures Procedures    Medications Ordered in ED Medications  lactated ringers infusion (has no administration in time range)  lactated ringers bolus  1,000 mL (1,000 mLs Intravenous New Bag/Given 11/05/21 2247)    And  lactated ringers bolus 1,000 mL (1,000 mLs Intravenous New Bag/Given 11/05/21 2344)    And  lactated ringers bolus 250 mL (has no administration in time range)  meropenem (MERREM) 1 g in sodium chloride 0.9 % 100 mL IVPB (has no administration in time range)  vancomycin (VANCOREADY) IVPB 1500 mg/300 mL (1,500 mg Intravenous New Bag/Given 11/05/21 2353)  sodium chloride 0.9 % bolus 1,000 mL (1,000 mLs Intravenous New Bag/Given 11/05/21 2125)  acetaminophen (TYLENOL) tablet 1,000 mg (1,000 mg Oral Given 11/05/21 2244)  ceFEPIme (MAXIPIME) 2 g in sodium chloride 0.9 % 100 mL IVPB (2 g Intravenous New Bag/Given 11/05/21 2250)  morphine (PF) 4 MG/ML injection 4 mg (4 mg Intravenous Given 11/05/21 2333)    ED Course/ Medical Decision Making/ A&P                           Medical Decision Making Amount and/or Complexity of Data Reviewed Labs: ordered. Radiology: ordered.  Risk OTC drugs. Prescription drug management. Decision regarding hospitalization.   This patient presents to the ED for concern of uti, this involves an extensive number of treatment options, and is a complaint that carries with it a high risk of complications and morbidity.  The differential diagnosis includes sepsis, uti, other infection   Co morbidities that complicate the patient evaluation  diffuse large B-cell lumphoma (followed by Dr. Marin Olp; last chemo was on 6/23), DM2, COPD, AAA, GERD, HLD, depression, anxiety, and htn   Additional history obtained:  Additional history obtained from epic chart review External records from outside source obtained and reviewed including wife   Lab Tests:  I Ordered, and personally interpreted labs.  The pertinent results include:  cbc with wbc only 0.2, hgb 10.8; ua red with many bact, lactic is 2, cmp with glucose elevated at 259   Imaging Studies ordered:  I ordered imaging studies including cxr  I  independently visualized and interpreted imaging which showed  IMPRESSION:  Left basilar scarring.   I agree with the radiologist interpretation   Cardiac Monitoring:  The patient was maintained on a cardiac monitor.  I personally viewed and interpreted the cardiac monitored which showed an underlying rhythm of: st   Medicines ordered and prescription drug management:  I ordered medication including maxipime, merrem, vanc, and tyleno  for infection and fever  Reevaluation of the patient after these medicines showed that the patient improved I have reviewed the patients home medicines and have made adjustments as needed   Test Considered:  ct   Critical Interventions:  abx   Consultations Obtained:  I requested consultation with the hospitalist (Dr. Trilby Drummer),  and discussed lab and imaging findings as well as pertinent plan - he will admit   Problem List / ED Course:  Neutropenic fever:  Pt given broad spectrum abx.  Pharmacy recommended coverage with Merrem as he has a hx of ESBL  E.coli.  This would also cover most other infections other than MRSA, so we also added vancomycin. Code sepsis called after pt developed fever while here.  IVFs have helped with HR. Left arm cellulitis vs gout:  iv abx given   Reevaluation:  After the interventions noted above, I reevaluated the patient and found that they have :improved   Social Determinants of Health:  Lives at home with wife   Dispostion:  After consideration of the diagnostic results and the patients response to treatment, I feel that the patent would benefit from admission.    CRITICAL CARE Performed by: Isla Pence   Total critical care time: 30 minutes  Critical care time was exclusive of separately billable procedures and treating other patients.  Critical care was necessary to treat or prevent imminent or life-threatening deterioration.  Critical care was time spent personally by me on the following  activities: development of treatment plan with patient and/or surrogate as well as nursing, discussions with consultants, evaluation of patient's response to treatment, examination of patient, obtaining history from patient or surrogate, ordering and performing treatments and interventions, ordering and review of laboratory studies, ordering and review of radiographic studies, pulse oximetry and re-evaluation of patient's condition.         Final Clinical Impression(s) / ED Diagnoses Final diagnoses:  Neutropenic fever ([place])  Sepsis, due to unspecified organism, unspecified whether acute organ dysfunction present [facility])    Rx / DC Orders ED Discharge Orders     None         Isla Pence, MD 11/05/21 2356

## 2021-11-05 NOTE — H&P (Addendum)
History and Physical   Aaron Weaver ENI:[id number] DOB: [birth year] DOA: 11/05/2021  PCP: [name], MD   Patient coming from: Home  Chief Complaint: Weakness, body aches, fever  HPI: Aaron Weaver Sr. is a 66 y.o. male with medical history significant of lymphoma, pancytopenia, diabetes, hyperlipidemia, RLS, anxiety, depression, hypertension, COPD, atrial fibrillation, urinary retention, GERD, AAA who presents with weakness body aches and fever.  Patient presenting with 1 to 2 days of weakness body aches and he believes a subjective fever yesterday.  Reports he feels similar to previous episode of sepsis.  He reports some redness at his left elbow that seem to improve with taking a dose of allopurinol this morning.  He is on chronic Bactrim and Macrobid for UTI suppression (with indwelling Foley) as well as famciclovir in the setting of chemotherapy for lymphoma.  Had his last cycle of chemotherapy recently and then 5 days ago had 2 unit RBC transfusion and pegfilgrastim.  He denies chills, chest pain, shortness of breath, abdominal pain, constipation, diarrhea, nausea, vomiting.  ED Course: Vital signs in ED significant for fever to one 2.4, tachycardia in the 100s to 120s, respiratory rate in the 20s, blood pressure in the 61W to 431 systolic.  Lab work-up included CMP with sodium 132, bicarb 21, glucose 259, calcium 8.8, albumin 3.4.  CBC significant for hemoglobin stable at 10.8, platelets 37, leukopenia at 0.2 and ANC of 0.0.  PT, INR within normal limits.  Lactic acid borderline at 2 with repeat pending.  Respiratory panel for flu and COVID pending.  Urinalysis with bacteria but otherwise unable to interpret due to pigment/blood.  Urine culture and blood culture pending.  Chest x-ray with left basilar scarring.  Patient received Tylenol and morphine in the ED.  Also started on vancomycin and meropenem given history of resistant Klebsiella.  Received 3.25 L in the ED and started on  rate 150 cc/h.  Review of Systems: As per HPI otherwise all other systems reviewed and are negative.  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   Dysrhythmia    GERD (gastroesophageal reflux disease)    Impacted cerumen    Impotence of organic origin    Lumbago    Lung nodule    MRSA bacteremia 05/31/2021   Multiple rib fractures 06/28/2021   Neutropenic fever ([place]) 08/13/2021   Other and unspecified hyperlipidemia    Routine general medical examination at a health care facility    Sepsis due to gram-negative UTI ([place]) 08/13/2021   Skipped heart beats    Syncope and collapse 06/28/2021   Tobacco use disorder    Type II or unspecified type diabetes mellitus without mention of complication, not stated as uncontrolled    type II   Unspecified essential hypertension     Past Surgical History:  Procedure Laterality Date   ABDOMINAL AORTIC ENDOVASCULAR STENT GRAFT Bilateral 07/12/2020   Procedure: ABDOMINAL AORTIC ENDOVASCULAR STENT GRAFT;  Surgeon: [name], MD;  Location: [place];  Service: Vascular;  Laterality:  Bilateral;   AXILLARY LYMPH NODE BIOPSY Right 04/27/2021   Procedure: AXILLARY EXCISIONAL LYMPH NODE BIOPSY;  Surgeon: Dwan Bolt, MD;  Location: [place];  Service: General;  Laterality: Right;   BACK SURGERY     fracture   CARPECTOMY Right 09/11/2019   Procedure: PROXIMAL ROW CARPECTOMY; RADIAL STYLOIDECTOMY; POSTERIOR INTEROSSIUS NERVE RESECTION;  Surgeon: Daryll Brod, MD;  Location: [facility];  Service: Orthopedics;  Laterality: Right;  AXILLARY BLOCK    COLONOSCOPY     INGUINAL HERNIA REPAIR     right   IR FLUORO GUIDE CV LINE RIGHT  06/16/2021   IR IMAGING GUIDED PORT INSERTION  07/07/2021   IR US GUIDE VASC ACCESS LEFT  06/16/2021   POLYPECTOMY     PORT-A-CATH REMOVAL N/A 06/01/2021   Procedure: REMOVAL PORT-A-CATH;  Surgeon: Armandina Gemma, MD;  Location: [facility];  Service: General;  Laterality: N/A;   PORTACATH PLACEMENT Right 05/13/2021   Procedure: INSERTION PORT-A-CATH;  Surgeon: Dwan Bolt, MD;  Location: [facility];  Service: General;  Laterality: Right;   TEE WITHOUT CARDIOVERSION N/A 06/06/2021   Procedure: TRANSESOPHAGEAL ECHOCARDIOGRAM (TEE);  Surgeon: Lelon Perla, MD;  Location: [facility];  Service: Cardiovascular;  Laterality: N/A;   TONSILLECTOMY      Social History  reports that he quit smoking about 4 years ago. His smoking use included cigarettes. He started smoking about 56 years ago. He has a 26.50 pack-year smoking history. He has never used smokeless tobacco. He reports current alcohol use. He reports that he does not use drugs.  Allergies  Allergen Reactions   Fexofenadine Other (See Comments)    "dries me out too much"   Penicillins Hives   Pravastatin Sodium Other (See Comments)    Aches   Requip [Ropinirole Hcl] Other (See Comments)    Bad dreams   Hydrochlorothiazide Other (See Comments)    Cramps    Family History  Problem Relation Age of Onset   Colon cancer Mother 63   Hypertension Father    Coronary artery disease Other        male<60 and male<50  1st degree relative   Colon polyps Brother    Esophageal cancer Neg Hx    Rectal cancer Neg Hx    Stomach cancer Neg Hx   Reviewed on admission  Prior to Admission medications   Medication Sig Start Date End Date Taking? Authorizing Provider  acetaminophen (TYLENOL) 325 MG tablet Take 650 mg by mouth every 6 (six) hours as needed for mild pain or headache.   Yes [provider]  albuterol (PROAIR HFA) 108 (90 Base) MCG/ACT inhaler  Inhale 1-2 puffs into the lungs every 6 (six) hours as needed for wheezing or shortness of breath. 10/15/20  Yes Collene Gobble, MD  alendronate (FOSAMAX) 70 MG tablet Take 70 mg by mouth every Sunday. 02/12/20  Yes [provider]  allopurinol (ZYLOPRIM) 100 MG tablet Take 1 tablet (100 mg total) by mouth daily. Start 3 days BEFORE chemotherapy starts 10/21/21  Yes [name], MD  aspirin EC 81 MG EC tablet Take 1 tablet (81 mg total) by mouth daily at 6 (six) AM. Swallow whole. Patient taking differently: Take 81 mg by mouth in the morning. Swallow whole. 07/14/20  Yes Ulyses Amor, PA-C  atenolol (TENORMIN) 100 MG tablet Take 100 mg by mouth daily. 09/02/20  Yes [provider]  calcium carbonate (OSCAL) 1500 (600 Ca) MG TABS tablet Take 600 mg  of elemental calcium by mouth daily with breakfast.   Yes [provider]  diclofenac Sodium (VOLTAREN) 1 % GEL Apply 2 g topically daily as needed (to painful sites).   Yes [provider]  DULoxetine (CYMBALTA) 60 MG capsule Take 60 mg by mouth at bedtime. 04/01/18  Yes [provider]  famciclovir (FAMVIR) 250 MG tablet Take 1 tablet (250 mg total) by mouth 2 (two) times daily. 06/09/21  Yes [name], MD  Fish Oil OIL Take 1,500 mg by mouth daily.   Yes [provider]  insulin glargine (LANTUS) 100 UNIT/ML Solostar Pen Inject 25 Units into the skin daily. 06/30/21  Yes Hosie Poisson, MD  insulin lispro (HUMALOG) 100 UNIT/ML KwikPen Inject 2-15 Units into the skin 4 (four) times daily - after meals and at bedtime. Glucose 121 - 150: 2 units, Glucose 151 - 200: 3 units, Glucose 201 - 250: 5 units, Glucose 251 - 300: 8 units, Glucose 301 - 350: 11 units, Glucose 351 - 400: 15 units, Glucose > 400 call MD 06/08/21  Yes Dwyane Dee, MD  LORazepam (ATIVAN) 0.5 MG tablet Take 1 tablet (0.5 mg total) by mouth every 8 (eight) hours. Take for nausea as needed 11/01/21  Yes Ennever, Rudell Cobb, MD  Multiple  Vitamin (MULTIVITAMIN ADULT PO) Take 1 tablet by mouth daily.   Yes [provider]  nitrofurantoin, macrocrystal-monohydrate, (MACROBID) 100 MG capsule Take 1 capsule (100 mg total) by mouth 2 (two) times daily. 11/01/21  Yes Celso Amy, NP  omeprazole (PRILOSEC) 40 MG capsule Take 40 mg by mouth every evening.   Yes [provider]  potassium chloride SA (KLOR-CON M20) 20 MEQ tablet TAKE 1 TABLET (20 MEQ TOTAL) BY MOUTH DAILY.  Patient taking differently: Take 20 mEq by mouth in the morning. 02/13/13  Yes Plotnikov, Evie Lacks, MD  rosuvastatin (CRESTOR) 10 MG tablet Take 10 mg by mouth daily. 03/16/20  Yes [provider]  sulfamethoxazole-trimethoprim (BACTRIM DS) 800-160 MG tablet Take 1 tablet by mouth daily. 08/26/21  Yes Ennever, Rudell Cobb, MD  tadalafil (CIALIS) 5 MG tablet Take 5 mg by mouth in the morning.   Yes [provider]  tamsulosin (FLOMAX) 0.4 MG CAPS capsule Take 0.4 mg by mouth every evening.   Yes [provider]  Zinc 50 MG TABS Take 50 mg by mouth daily.   Yes [provider]  blood glucose meter kit and supplies KIT Dispense based on patient and insurance preference. Use up to four times daily as directed. 06/08/21   Dwyane Dee, MD  Continuous Blood Gluc Receiver (FREESTYLE LIBRE 2 READER) [place]  06/16/21   [provider]  Continuous Blood Gluc Sensor (FREESTYLE LIBRE 14 DAY SENSOR) MISC Inject 1 Device into the skin every 14 (fourteen) days.    [provider]  fluconazole (DIFLUCAN) 100 MG tablet Take 1 tablet (100 mg total) by mouth daily. Patient not taking: Reported on 11/05/2021 10/20/21   [name], MD  Insulin Pen Needle 32G X 4 MM MISC 1 each by Does not apply route 4 (four) times daily - after meals and at bedtime. 06/08/21   Dwyane Dee, MD  predniSONE (DELTASONE) 20 MG tablet Take 60 mg by mouth daily. Patient not taking: Reported on 11/05/2021 08/23/21   [provider]   prochlorperazine (COMPAZINE) 10 MG tablet Take 1 tablet (10 mg total) by mouth every 6 (six) hours as needed (Nausea or vomiting). 05/17/21 11/04/21  Burney Gauze  R, MD    Physical Exam: Vitals:   11/05/21 2230 11/05/21 2300 11/05/21 2330 11/05/21 2339  BP: 131/82 117/74 116/76   Pulse: (!) 107 (!) 110 (!) 109   Resp: (!) 23 20 (!) 26   Temp:    (!) 102 F (38.9 C)  TempSrc:    Axillary  SpO2: 97% 96% 96%   Weight:      Height:        Physical Exam Constitutional:      General: He is not in acute distress.    Appearance: Normal appearance.  HENT:     Head: Normocephalic and atraumatic.     Mouth/Throat:     Mouth: Mucous membranes are moist.     Pharynx: Oropharynx is clear.  Eyes:     Extraocular Movements: Extraocular movements intact.     Pupils: Pupils are equal, round, and reactive to light.  Cardiovascular:     Rate and Rhythm: Normal rate and regular rhythm.     Pulses: Normal pulses.     Heart sounds: Normal heart sounds.  Pulmonary:     Effort: Pulmonary effort is normal. No respiratory distress.     Breath sounds: Normal breath sounds.  Abdominal:     General: Bowel sounds are normal. There is no distension.     Palpations: Abdomen is soft.     Tenderness: There is no abdominal tenderness.  Musculoskeletal:        General: No swelling or deformity.  Skin:    General: Skin is warm and dry.     Comments: Painful and erythematous left elbow.  Neurological:     General: No focal deficit present.     Mental Status: Mental status is at baseline.    Labs on Admission: I have personally reviewed following labs and imaging studies  CBC: Recent Labs  Lab 11/04/21 0900 11/05/21 2045  WBC 1.2* 0.2*  NEUTROABS 1.0* 0.0*  HGB 11.2* 10.8*  HCT 32.9* 31.6*  MCV 89.2 90.0  PLT 60* 37*    Basic Metabolic Panel: Recent Labs  Lab 11/04/21 0900 11/05/21 2045  NA 131* 132*  K 4.8 4.8  CL 97* 100  CO2 26 21*  GLUCOSE 368* 259*  BUN 23 23  CREATININE  0.75 0.79  CALCIUM 9.1 8.8*    GFR: Estimated Creatinine Clearance: 92 mL/min (by C-G formula based on SCr of 0.79 mg/dL).  Liver Function Tests: Recent Labs  Lab 11/04/21 0900 11/05/21 2045  AST 17 16  ALT 38 28  ALKPHOS 65 58  BILITOT 0.7 1.1  PROT 6.2* 6.7  ALBUMIN 3.8 3.4*    Urine analysis:    Component Value Date/Time   COLORURINE RED (A) 11/05/2021 2119   APPEARANCEUR CLOUDY (A) 11/05/2021 2119   LABSPEC 1.015 11/05/2021 2119   PHURINE  11/05/2021 2119    TEST NOT REPORTED DUE TO COLOR INTERFERENCE OF URINE PIGMENT   GLUCOSEU (A) 11/05/2021 2119    TEST NOT REPORTED DUE TO COLOR INTERFERENCE OF URINE PIGMENT   GLUCOSEU NEGATIVE 11/23/2011 0756   HGBUR (A) 11/05/2021 2119    TEST NOT REPORTED DUE TO COLOR INTERFERENCE OF URINE PIGMENT   BILIRUBINUR (A) 11/05/2021 2119    TEST NOT REPORTED DUE TO COLOR INTERFERENCE OF URINE PIGMENT   KETONESUR (A) 11/05/2021 2119    TEST NOT REPORTED DUE TO COLOR INTERFERENCE OF URINE PIGMENT   PROTEINUR (A) 11/05/2021 2119    TEST NOT REPORTED DUE TO COLOR INTERFERENCE OF URINE PIGMENT  UROBILINOGEN 0.2 11/23/2011 0756   NITRITE (A) 11/05/2021 2119    TEST NOT REPORTED DUE TO COLOR INTERFERENCE OF URINE PIGMENT   LEUKOCYTESUR (A) 11/05/2021 2119    TEST NOT REPORTED DUE TO COLOR INTERFERENCE OF URINE PIGMENT    Radiological Exams on Admission: DG Chest 2 View  Result Date: 11/05/2021 CLINICAL DATA:  Generalized weakness EXAM: CHEST - 2 VIEW COMPARISON:  08/13/2021 FINDINGS: Cardiac shadow is stable. Right chest wall port is stable. Linear scarring in the left base is again noted. No sizable effusion is seen. Changes of prior vertebral augmentation are noted. IMPRESSION: Left basilar scarring. Electronically Signed   By: Inez Catalina M.D.   On: 11/05/2021 20:55    EKG: Not checked in the ED.  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  Reportedly similar to previous episodes of sepsis. > In the setting of pancytopenia secondary to chemotherapy for lymphoma as below > History of resistant UTIs and is on suppression with Macrobid and Bactrim.  Also noting some redness of his left elbow that seem to have improved with allopurinol earlier today. > Concern for febrile neutropenia with possible etiology of UTI versus cellulitis. > Also meets sepsis criteria with fever, tachycardia, tachypnea, leukopenia presumed urinary source. > Covered with broad-spectrum antibiotics in the ED including vancomycin and meropenem given his history of resistant UTI. > Also received 3.25 L of IV fluids and started on a rate in the ED. > Has ANC of 0.0 despite receiving pegfilgrastim a few days ago. - Monitor on progressive unit - Continue with vancomycin and meropenem - Follow-up urine culture and blood culture - Trend fever curve and WBC - We will benefit from hematology/oncology consultation and will need further pegfilgrastim or other  - Continue with IV fluids  Pancytopenia > As above WBC 0.2 with ANC 0.0, hemoglobin 10.8, platelets 37. > Secondary to chemotherapy. - SCDs for DVT prophylaxis - Hemoglobin stable status post 2 unit transfusion recently - Neutropenia as above - Trend CBC  Lymphoma > Following with Dr. Marin Olp received final dose of R-CHOP chemo recently resulting in pancytopenia as above. - Replace home  famciclovir with formulary Valtrex - On antibiotics as above  Diabetes > On 25U long acting daily - 15 units long-acting insulin - SSI  Hyperlipidemia - Continue home rosuvastatin   GERD - Continue home PPI  Urinary retention - Continue home tamsulosin - Urinary catheter in place  Hypertension - Hold home atenolol in the setting of sepsis as above  Anxiety Depression - Continue home duloxetine  DVT prophylaxis: SCDs Code Status:   Full Family Communication:   Admission, he states his family is up-to-date and his wife just left. Disposition Plan:   Patient is from:  Home  Anticipated DC to:  Home  Anticipated DC date:  1 to 4 days  Anticipated DC barriers: None  Consults called:  None on admission,  will need hematology/oncology consult in the morning. Admission status:  Observation, progressive  Severity of Illness: The appropriate patient status for this patient is OBSERVATION. Observation status is judged to be reasonable and necessary in order to provide the required intensity of service to ensure the patient's safety. The patient's presenting symptoms, physical exam findings, and initial radiographic and laboratory data in the context of their medical condition is felt to place them at decreased risk for further clinical deterioration. Furthermore, it is anticipated that the patient will be medically stable for discharge from the hospital within 2 midnights of admission.    Aaron Bruins MD [facility]  How to contact the [facility] Attending or Consulting provider [place] or covering provider during after hours [place], for this patient?   Check the care team in [facility] and look for a) attending/consulting [facility] provider listed and b) the [facility] team listed Log into [email or web address] and use [place]'s universal password to access. If you do not have the password, please contact the hospital operator. Locate the [facility] provider you are looking for under [facility]  and page to a number that you can be directly reached. If you still have difficulty reaching the provider, please page the [facility] (Director on Call) for the Hospitalists listed on amion for assistance.  11/06/2021, 12:00 AM

## 2021-11-05 NOTE — ED Notes (Signed)
MD notified of elevated axillary temp 102.40F

## 2021-11-06 ENCOUNTER — Observation Stay (HOSPITAL_COMMUNITY): Payer: Medicare Other

## 2021-11-06 DIAGNOSIS — E872 Acidosis, unspecified: Secondary | ICD-10-CM | POA: Diagnosis present

## 2021-11-06 DIAGNOSIS — D61810 Antineoplastic chemotherapy induced pancytopenia: Secondary | ICD-10-CM

## 2021-11-06 DIAGNOSIS — G2581 Restless legs syndrome: Secondary | ICD-10-CM | POA: Diagnosis present

## 2021-11-06 DIAGNOSIS — R6520 Severe sepsis without septic shock: Secondary | ICD-10-CM | POA: Diagnosis present

## 2021-11-06 DIAGNOSIS — D701 Agranulocytosis secondary to cancer chemotherapy: Secondary | ICD-10-CM | POA: Diagnosis present

## 2021-11-06 DIAGNOSIS — C8338 Diffuse large B-cell lymphoma, lymph nodes of multiple sites: Secondary | ICD-10-CM | POA: Diagnosis present

## 2021-11-06 DIAGNOSIS — E1165 Type 2 diabetes mellitus with hyperglycemia: Secondary | ICD-10-CM | POA: Diagnosis present

## 2021-11-06 DIAGNOSIS — Z978 Presence of other specified devices: Secondary | ICD-10-CM | POA: Diagnosis not present

## 2021-11-06 DIAGNOSIS — I7143 Infrarenal abdominal aortic aneurysm, without rupture: Secondary | ICD-10-CM | POA: Diagnosis not present

## 2021-11-06 DIAGNOSIS — A419 Sepsis, unspecified organism: Secondary | ICD-10-CM | POA: Diagnosis present

## 2021-11-06 DIAGNOSIS — K219 Gastro-esophageal reflux disease without esophagitis: Secondary | ICD-10-CM | POA: Diagnosis present

## 2021-11-06 DIAGNOSIS — T451X5A Adverse effect of antineoplastic and immunosuppressive drugs, initial encounter: Secondary | ICD-10-CM | POA: Diagnosis present

## 2021-11-06 DIAGNOSIS — E860 Dehydration: Secondary | ICD-10-CM | POA: Diagnosis present

## 2021-11-06 DIAGNOSIS — Z960 Presence of urogenital implants: Secondary | ICD-10-CM

## 2021-11-06 DIAGNOSIS — N390 Urinary tract infection, site not specified: Secondary | ICD-10-CM | POA: Diagnosis present

## 2021-11-06 DIAGNOSIS — F32A Depression, unspecified: Secondary | ICD-10-CM | POA: Diagnosis present

## 2021-11-06 DIAGNOSIS — R5081 Fever presenting with conditions classified elsewhere: Secondary | ICD-10-CM | POA: Diagnosis present

## 2021-11-06 DIAGNOSIS — E785 Hyperlipidemia, unspecified: Secondary | ICD-10-CM | POA: Diagnosis present

## 2021-11-06 DIAGNOSIS — Z228 Carrier of other infectious diseases: Secondary | ICD-10-CM | POA: Diagnosis not present

## 2021-11-06 DIAGNOSIS — J449 Chronic obstructive pulmonary disease, unspecified: Secondary | ICD-10-CM | POA: Diagnosis present

## 2021-11-06 DIAGNOSIS — M25422 Effusion, left elbow: Secondary | ICD-10-CM | POA: Diagnosis not present

## 2021-11-06 DIAGNOSIS — I480 Paroxysmal atrial fibrillation: Secondary | ICD-10-CM | POA: Diagnosis present

## 2021-11-06 DIAGNOSIS — F419 Anxiety disorder, unspecified: Secondary | ICD-10-CM | POA: Diagnosis present

## 2021-11-06 DIAGNOSIS — D709 Neutropenia, unspecified: Secondary | ICD-10-CM | POA: Diagnosis not present

## 2021-11-06 DIAGNOSIS — K760 Fatty (change of) liver, not elsewhere classified: Secondary | ICD-10-CM | POA: Diagnosis present

## 2021-11-06 DIAGNOSIS — U071 COVID-19: Secondary | ICD-10-CM | POA: Diagnosis present

## 2021-11-06 DIAGNOSIS — L03114 Cellulitis of left upper limb: Secondary | ICD-10-CM | POA: Diagnosis present

## 2021-11-06 DIAGNOSIS — R509 Fever, unspecified: Secondary | ICD-10-CM | POA: Diagnosis present

## 2021-11-06 DIAGNOSIS — I714 Abdominal aortic aneurysm, without rupture, unspecified: Secondary | ICD-10-CM | POA: Diagnosis present

## 2021-11-06 DIAGNOSIS — I10 Essential (primary) hypertension: Secondary | ICD-10-CM | POA: Diagnosis present

## 2021-11-06 LAB — RESP PANEL BY RT-PCR (FLU A&B, COVID) ARPGX2
Influenza A by PCR: NEGATIVE
Influenza B by PCR: NEGATIVE
SARS Coronavirus 2 by RT PCR: POSITIVE — AB

## 2021-11-06 LAB — CBC
HCT: 25.2 % — ABNORMAL LOW (ref 39.0–52.0)
Hemoglobin: 8.8 g/dL — ABNORMAL LOW (ref 13.0–17.0)
MCH: 30.9 pg (ref 26.0–34.0)
MCHC: 34.9 g/dL (ref 30.0–36.0)
MCV: 88.4 fL (ref 80.0–100.0)
Platelets: 20 10*3/uL — CL (ref 150–400)
RBC: 2.85 MIL/uL — ABNORMAL LOW (ref 4.22–5.81)
RDW: 16 % — ABNORMAL HIGH (ref 11.5–15.5)
WBC: 0.2 10*3/uL — CL (ref 4.0–10.5)
nRBC: 0 % (ref 0.0–0.2)

## 2021-11-06 LAB — COMPREHENSIVE METABOLIC PANEL
ALT: 22 U/L (ref 0–44)
AST: 12 U/L — ABNORMAL LOW (ref 15–41)
Albumin: 2.6 g/dL — ABNORMAL LOW (ref 3.5–5.0)
Alkaline Phosphatase: 44 U/L (ref 38–126)
Anion gap: 5 (ref 5–15)
BUN: 16 mg/dL (ref 8–23)
CO2: 25 mmol/L (ref 22–32)
Calcium: 8.1 mg/dL — ABNORMAL LOW (ref 8.9–10.3)
Chloride: 105 mmol/L (ref 98–111)
Creatinine, Ser: 0.64 mg/dL (ref 0.61–1.24)
GFR, Estimated: >60 mL/min (ref >60)
Glucose, Bld: 163 mg/dL — ABNORMAL HIGH (ref 70–99)
Potassium: 3.9 mmol/L (ref 3.5–5.1)
Sodium: 135 mmol/L (ref 135–145)
Total Bilirubin: 0.7 mg/dL (ref 0.3–1.2)
Total Protein: 5.3 g/dL — ABNORMAL LOW (ref 6.5–8.1)

## 2021-11-06 LAB — CBG MONITORING, ED
Glucose-Capillary: 205 mg/dL — ABNORMAL HIGH (ref 70–99)
Glucose-Capillary: 181 mg/dL — ABNORMAL HIGH (ref 70–99)
Glucose-Capillary: 221 mg/dL — ABNORMAL HIGH (ref 70–99)

## 2021-11-06 LAB — GLUCOSE, CAPILLARY
Glucose-Capillary: 254 mg/dL — ABNORMAL HIGH (ref 70–99)
Glucose-Capillary: 201 mg/dL — ABNORMAL HIGH (ref 70–99)

## 2021-11-06 LAB — LACTIC ACID, PLASMA: Lactic Acid, Venous: 1.1 mmol/L (ref 0.5–1.9)

## 2021-11-06 MED ORDER — VANCOMYCIN HCL IN DEXTROSE 1-5 GM/200ML-% IV SOLN
1000.0000 mg | Freq: Two times a day (BID) | INTRAVENOUS | Status: DC
  Administered 2021-11-07 – 2021-11-08 (×3): 1000 mg via INTRAVENOUS
  Filled 2021-11-06 (×3): qty 200

## 2021-11-06 MED ORDER — HYDROCODONE-ACETAMINOPHEN 5-325 MG PO TABS
1.0000 | ORAL_TABLET | Freq: Four times a day (QID) | ORAL | Status: DC | PRN
  Administered 2021-11-06 – 2021-11-09 (×5): 1 via ORAL
  Filled 2021-11-06 (×5): qty 1

## 2021-11-06 MED ORDER — INSULIN GLARGINE-YFGN 100 UNIT/ML ~~LOC~~ SOLN
15.0000 [IU] | Freq: Every day | SUBCUTANEOUS | Status: DC
  Administered 2021-11-06: 15 [IU] via SUBCUTANEOUS
  Filled 2021-11-06 (×2): qty 0.15

## 2021-11-06 MED ORDER — CALCIUM CARBONATE 1250 (500 CA) MG PO TABS
500.0000 mg | ORAL_TABLET | Freq: Every day | ORAL | Status: DC
  Administered 2021-11-07 – 2021-11-10 (×4): 1250 mg via ORAL
  Filled 2021-11-06 (×4): qty 1

## 2021-11-06 MED ORDER — INSULIN ASPART 100 UNIT/ML IJ SOLN
0.0000 [IU] | Freq: Three times a day (TID) | INTRAMUSCULAR | Status: DC
  Administered 2021-11-06: 5 [IU] via SUBCUTANEOUS
  Administered 2021-11-06: 3 [IU] via SUBCUTANEOUS
  Administered 2021-11-06: 8 [IU] via SUBCUTANEOUS
  Administered 2021-11-07: 3 [IU] via SUBCUTANEOUS
  Administered 2021-11-07: 5 [IU] via SUBCUTANEOUS
  Administered 2021-11-07: 8 [IU] via SUBCUTANEOUS
  Administered 2021-11-08: 3 [IU] via SUBCUTANEOUS
  Administered 2021-11-08 (×2): 5 [IU] via SUBCUTANEOUS
  Administered 2021-11-09: 3 [IU] via SUBCUTANEOUS
  Administered 2021-11-09: 8 [IU] via SUBCUTANEOUS
  Administered 2021-11-09: 5 [IU] via SUBCUTANEOUS
  Administered 2021-11-10: 8 [IU] via SUBCUTANEOUS
  Administered 2021-11-10: 3 [IU] via SUBCUTANEOUS
  Filled 2021-11-06: qty 0.15

## 2021-11-06 MED ORDER — LACTATED RINGERS IV SOLN: INTRAVENOUS | Status: DC

## 2021-11-06 MED ORDER — ALLOPURINOL 100 MG PO TABS: 100.0000 mg | ORAL_TABLET | Freq: Every day | ORAL | Status: DC

## 2021-11-06 MED ORDER — IOHEXOL 300 MG/ML  SOLN
100.0000 mL | Freq: Once | INTRAMUSCULAR | Status: AC | PRN
  Administered 2021-11-06: 100 mL via INTRAVENOUS

## 2021-11-06 MED ORDER — KETOROLAC TROMETHAMINE 15 MG/ML IJ SOLN
7.5000 mg | Freq: Four times a day (QID) | INTRAMUSCULAR | Status: AC | PRN
  Administered 2021-11-06 – 2021-11-08 (×5): 7.5 mg via INTRAVENOUS
  Filled 2021-11-06 (×5): qty 1

## 2021-11-06 MED ORDER — SODIUM CHLORIDE 0.9 % IV SOLN
1.0000 g | Freq: Three times a day (TID) | INTRAVENOUS | Status: DC
  Administered 2021-11-06 – 2021-11-09 (×9): 1 g via INTRAVENOUS
  Filled 2021-11-06 (×2): qty 20
  Filled 2021-11-06: qty 1
  Filled 2021-11-06: qty 20
  Filled 2021-11-06: qty 1
  Filled 2021-11-06: qty 20
  Filled 2021-11-06: qty 1
  Filled 2021-11-06 (×3): qty 20

## 2021-11-06 MED ORDER — INSULIN ASPART 100 UNIT/ML IJ SOLN
0.0000 [IU] | Freq: Every day | INTRAMUSCULAR | Status: DC
  Administered 2021-11-06: 2 [IU] via SUBCUTANEOUS
  Administered 2021-11-06: 5 [IU] via SUBCUTANEOUS
  Administered 2021-11-07: 3 [IU] via SUBCUTANEOUS
  Administered 2021-11-08 – 2021-11-09 (×2): 2 [IU] via SUBCUTANEOUS
  Filled 2021-11-06: qty 0.05

## 2021-11-06 MED ORDER — CHLORHEXIDINE GLUCONATE CLOTH 2 % EX PADS
6.0000 | MEDICATED_PAD | Freq: Every day | CUTANEOUS | Status: DC
  Administered 2021-11-06 – 2021-11-10 (×5): 6 via TOPICAL

## 2021-11-06 MED ORDER — SODIUM CHLORIDE 0.9% FLUSH: 10.0000 mL | INTRAVENOUS | Status: DC | PRN

## 2021-11-06 NOTE — Sepsis Progress Note (Signed)
Current ED RN promptly returned [place] stating 2nd [place] will be drawn as 250cc of fluid left to adm

## 2021-11-06 NOTE — Consult Note (Signed)
Referral MD  Reason for Referral: Febrile neutropenia secondary to chemotherapy  Chief Complaint  Patient presents with   Generalized Body Aches   UTI  : Fever.  HPI: [name] is well-known to me.  He is a 66 year old white male.  He has advanced large cell non-Hodgkin's lymphoma.  We have treated him with chemotherapy.  He has completed all of his chemotherapy.  He had 8 cycles of R-CHOP.  He completed this about 2 weeks ago.  He has been coming to the office for fluids.  He was last in the office this past Friday.  His problem has been his left elbow.  This was swollen and red.  He has had this happen before.  When we saw him on the 30th, his white cell count was 1.2.  Hemoglobin 11.2.  Platelet count 60,000.  This morning, his white cell count is 200.  Hemoglobin 8.8.  Platelet count 20,000.  His absolute neutrophil count is 0.  Again he has a swollen red left elbow.  He is going need to have a CT scan done of this.  He probably will need to have some kind of arthrocentesis to see if there is any infection there.  He said at home he had a temperature of about 103.  He has a indwelling Foley catheter.  He has had problems with UTIs in the past.  He has had an issue with Staph.  He has had no diarrhea.  He is really not that hungry.  He has had no mouth sores.  He has had some nausea.  There is been no rashes.  Has not noted any swollen lymph nodes.  Overall, his blood sugars have been a real problem.  On 11/04/2021, the blood sugar was 368.  Yesterday, the blood sugar was 259.  This morning is 163.  He is on broad-spectrum antibiotics with Maxipime/Merrem/vancomycin.  Cultures have been taken.  Overall, his overall performance status is ECOG 2.      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   Dysrhythmia    GERD (gastroesophageal reflux disease)    Impacted cerumen    Impotence of organic origin    Lumbago    Lung nodule    MRSA bacteremia 05/31/2021   Multiple rib fractures 06/28/2021   Neutropenic fever ([place]) 08/13/2021   Other and unspecified hyperlipidemia    Routine general medical examination at a health care facility    Sepsis due to gram-negative UTI ([place]) 08/13/2021   Skipped heart beats    Syncope and collapse 06/28/2021   Tobacco use disorder    Type II or unspecified type diabetes mellitus without mention of complication, not stated as uncontrolled    type II   Unspecified essential hypertension   :   Past Surgical History:  Procedure Laterality Date   ABDOMINAL AORTIC ENDOVASCULAR STENT GRAFT Bilateral 07/12/2020   Procedure: ABDOMINAL AORTIC ENDOVASCULAR STENT GRAFT;  Surgeon: [name], MD;  Location: [facility];  Service: Vascular;  Laterality: Bilateral;  AXILLARY LYMPH NODE BIOPSY Right 04/27/2021   Procedure: AXILLARY EXCISIONAL LYMPH NODE BIOPSY;  Surgeon: Dwan Bolt, MD;  Location: [facility];  Service: General;  Laterality: Right;   BACK SURGERY     fracture   CARPECTOMY Right 09/11/2019   Procedure: PROXIMAL ROW CARPECTOMY; RADIAL STYLOIDECTOMY; POSTERIOR INTEROSSIUS NERVE RESECTION;  Surgeon: Daryll Brod, MD;  Location: [facility];  Service: Orthopedics;  Laterality: Right;  AXILLARY BLOCK   COLONOSCOPY     INGUINAL HERNIA REPAIR     right   IR FLUORO GUIDE CV LINE RIGHT  06/16/2021   IR IMAGING GUIDED PORT INSERTION  07/07/2021   IR US GUIDE VASC ACCESS LEFT  06/16/2021   POLYPECTOMY     PORT-A-CATH REMOVAL N/A 06/01/2021   Procedure: REMOVAL PORT-A-CATH;  Surgeon: Armandina Gemma, MD;  Location: [facility];  Service:  General;  Laterality: N/A;   PORTACATH PLACEMENT Right 05/13/2021   Procedure: INSERTION PORT-A-CATH;  Surgeon: Dwan Bolt, MD;  Location: [facility];  Service: General;  Laterality: Right;   TEE WITHOUT CARDIOVERSION N/A 06/06/2021   Procedure: TRANSESOPHAGEAL ECHOCARDIOGRAM (TEE);  Surgeon: Lelon Perla, MD;  Location: [facility];  Service: Cardiovascular;  Laterality: N/A;   TONSILLECTOMY    :   Current Facility-Administered Medications:    acetaminophen (TYLENOL) tablet 650 mg, 650 mg, Oral, Q6H PRN **OR** acetaminophen (TYLENOL) suppository 650 mg, 650 mg, Rectal, Q6H PRN, Marcelyn Bruins, MD   albuterol (PROVENTIL) (2.5 MG/3ML) 0.083% nebulizer solution 3 mL, 3 mL, Inhalation, Q6H PRN, Marcelyn Bruins, MD   DULoxetine (CYMBALTA) DR capsule 60 mg, 60 mg, Oral, QHS, Marcelyn Bruins, MD, 60 mg at 11/06/21 0146   insulin aspart (novoLOG) injection 0-15 Units, 0-15 Units, Subcutaneous, TID WC, Marcelyn Bruins, MD, 3 Units at 11/06/21 0902   insulin aspart (novoLOG) injection 0-5 Units, 0-5 Units, Subcutaneous, QHS, Marcelyn Bruins, MD, 2 Units at 11/06/21 0146   insulin glargine-yfgn [facility]) injection 15 Units, 15 Units, Subcutaneous, Daily, Marcelyn Bruins, MD   lactated ringers infusion, , Intravenous, Continuous, Marcelyn Bruins, MD, Last Rate: 150 mL/hr at 11/06/21 0106, New Bag at 11/06/21 0106   LORazepam (ATIVAN) tablet 0.5 mg, 0.5 mg, Oral, Q8H, Marcelyn Bruins, MD, 0.5 mg at 11/06/21 0457   meropenem (MERREM) 1 g in sodium chloride 0.9 % 100 mL IVPB, 1 g, Intravenous, Q8H, Bell, Michelle T, RPH   pantoprazole (PROTONIX) EC tablet 40 mg, 40 mg, Oral, Daily, Marcelyn Bruins, MD   polyethylene glycol (MIRALAX / GLYCOLAX) packet 17 g, 17 g, Oral, Daily PRN, Marcelyn Bruins, MD   rosuvastatin (CRESTOR) tablet 10 mg, 10 mg, Oral, Daily, Marcelyn Bruins, MD   sodium chloride flush (NS) 0.9 % injection 3 mL, 3 mL, Intravenous, Q12H, Marcelyn Bruins, MD   tamsulosin [facility]) capsule 0.4 mg, 0.4 mg, Oral, QPM, Marcelyn Bruins, MD   valACYclovir (VALTREX) tablet 1,000 mg, 1,000 mg, Oral, Daily, Marcelyn Bruins, MD   [START ON 11/07/2021] vancomycin (VANCOCIN) IVPB 1000 mg/200 mL premix, 1,000 mg, Intravenous, Q12H, Eudelia Bunch, RPH  Current Outpatient Medications:    acetaminophen (TYLENOL) 325 MG tablet, Take 650 mg by mouth every 6 (six) hours as needed for mild pain or headache., Disp: , Rfl:    albuterol (PROAIR HFA) 108 (90 Base) MCG/ACT inhaler, Inhale 1-2 puffs into the lungs every 6 (six) hours as needed for wheezing or shortness of breath., Disp: 8 g, Rfl:  6   alendronate (FOSAMAX) 70 MG tablet, Take 70 mg by mouth every Sunday., Disp: , Rfl:    allopurinol (ZYLOPRIM) 100 MG tablet, Take 1 tablet (100 mg total) by mouth daily. Start 3 days BEFORE chemotherapy starts, Disp: 30 tablet, Rfl: 0   aspirin EC 81 MG EC tablet, Take 1 tablet (81 mg total) by mouth daily at 6 (six) AM. Swallow whole. (Patient taking differently: Take 81 mg by mouth in the morning. Swallow whole.), Disp: 30 tablet, Rfl: 11   atenolol (TENORMIN) 100 MG tablet, Take 100 mg by mouth daily., Disp: , Rfl:    calcium carbonate (OSCAL) 1500 (600 Ca) MG TABS tablet, Take 600 mg of elemental calcium by mouth daily with breakfast., Disp: , Rfl:    diclofenac Sodium (VOLTAREN) 1 % GEL, Apply 2 g topically daily as needed (to painful sites)., Disp: , Rfl:    DULoxetine (CYMBALTA) 60 MG capsule, Take 60 mg by mouth at bedtime., Disp: , Rfl:    famciclovir (FAMVIR) 250 MG tablet, Take 1 tablet (250 mg total) by mouth 2 (two) times daily., Disp: 30 tablet, Rfl: 8   Fish Oil OIL, Take 1,500 mg by mouth daily., Disp: , Rfl:    insulin glargine (LANTUS) 100 UNIT/ML Solostar Pen, Inject 25 Units into the skin daily., Disp: 15 mL, Rfl: 11   insulin lispro (HUMALOG) 100 UNIT/ML KwikPen, Inject 2-15 Units into the skin 4 (four) times daily - after meals and at  bedtime. Glucose 121 - 150: 2 units, Glucose 151 - 200: 3 units, Glucose 201 - 250: 5 units, Glucose 251 - 300: 8 units, Glucose 301 - 350: 11 units, Glucose 351 - 400: 15 units, Glucose > 400 call MD, Disp: 15 mL, Rfl: 11   LORazepam (ATIVAN) 0.5 MG tablet, Take 1 tablet (0.5 mg total) by mouth every 8 (eight) hours. Take for nausea as needed, Disp: 30 tablet, Rfl: 1   Multiple Vitamin (MULTIVITAMIN ADULT PO), Take 1 tablet by mouth daily., Disp: , Rfl:    nitrofurantoin, macrocrystal-monohydrate, (MACROBID) 100 MG capsule, Take 1 capsule (100 mg total) by mouth 2 (two) times daily., Disp: 14 capsule, Rfl: 0   omeprazole (PRILOSEC) 40 MG capsule, Take 40 mg by mouth every evening., Disp: , Rfl:    potassium chloride SA (KLOR-CON M20) 20 MEQ tablet, TAKE 1 TABLET (20 MEQ TOTAL) BY MOUTH DAILY.  (Patient taking differently: Take 20 mEq by mouth in the morning.), Disp: 30 tablet, Rfl: 5   rosuvastatin (CRESTOR) 10 MG tablet, Take 10 mg by mouth daily., Disp: , Rfl:    sulfamethoxazole-trimethoprim (BACTRIM DS) 800-160 MG tablet, Take 1 tablet by mouth daily., Disp: 20 tablet, Rfl: 4   tadalafil (CIALIS) 5 MG tablet, Take 5 mg by mouth in the morning., Disp: , Rfl:    tamsulosin (FLOMAX) 0.4 MG CAPS capsule, Take 0.4 mg by mouth every evening., Disp: , Rfl:    Zinc 50 MG TABS, Take 50 mg by mouth daily., Disp: , Rfl:    blood glucose meter kit and supplies KIT, Dispense based on patient and insurance preference. Use up to four times daily as directed., Disp: 1 each, Rfl: 0   Continuous Blood Gluc Receiver (FREESTYLE LIBRE 2 READER) DEVI, , Disp: , Rfl:    Continuous Blood Gluc Sensor (FREESTYLE LIBRE 14 DAY SENSOR) MISC, Inject 1 Device into the skin every 14 (fourteen) days., Disp: , Rfl:    fluconazole (DIFLUCAN) 100 MG tablet, Take 1 tablet (100 mg total)  by mouth daily. (Patient not taking: Reported on 11/05/2021), Disp: 10 tablet, Rfl: 1   Insulin Pen Needle 32G X 4 MM MISC, 1 each by Does not apply  route 4 (four) times daily - after meals and at bedtime., Disp: 100 each, Rfl: 5   predniSONE (DELTASONE) 20 MG tablet, Take 60 mg by mouth daily. (Patient not taking: Reported on 11/05/2021), Disp: , Rfl: :   DULoxetine  60 mg Oral QHS   insulin aspart  0-15 Units Subcutaneous TID WC   insulin aspart  0-5 Units Subcutaneous QHS   insulin glargine-yfgn  15 Units Subcutaneous Daily   LORazepam  0.5 mg Oral Q8H   pantoprazole  40 mg Oral Daily   rosuvastatin  10 mg Oral Daily   sodium chloride flush  3 mL Intravenous Q12H   tamsulosin  0.4 mg Oral QPM   valACYclovir  1,000 mg Oral Daily  :   Allergies  Allergen Reactions   Fexofenadine Other (See Comments)    "dries me out too much"   Penicillins Hives   Pravastatin Sodium Other (See Comments)    Aches   Requip [Ropinirole Hcl] Other (See Comments)    Bad dreams   Hydrochlorothiazide Other (See Comments)    Cramps  :   Family History  Problem Relation Age of Onset   Colon cancer Mother 49   Hypertension Father    Coronary artery disease Other        male<60 and male<50  1st degree relative   Colon polyps Brother    Esophageal cancer Neg Hx    Rectal cancer Neg Hx    Stomach cancer Neg Hx   :   Social History   Socioeconomic History   Marital status: Married    Spouse name: Marshon Bangs   Number of children: 1   Years of education: Not on file   Highest education level: Not on file  Occupational History   Occupation: [employer]  Tobacco Use   Smoking status: Former    Packs/day: 0.50    Years: 53.00    Total pack years: 26.50    Types: Cigarettes    Start date: 03/31/1965    Quit date: 07/22/2017    Years since quitting: 4.2   Smokeless tobacco: Never   Tobacco comments:    started at age 79.  Vaping Use   Vaping Use: Never used  Substance and Sexual Activity   Alcohol use: Yes    Comment: rare beer once or twice   Drug use: No   Sexual activity: Yes  Other Topics Concern   Not on file  Social  History Narrative   Not on file   Social Determinants of Health   Financial Resource Strain: Not on file  Food Insecurity: Not on file  Transportation Needs: Not on file  Physical Activity: Not on file  Stress: Not on file  Social Connections: Not on file  Intimate Partner Violence: Not on file  : Review of Systems  Constitutional:  Positive for fever and malaise/fatigue.  HENT: Negative.    Eyes: Negative.   Respiratory: Negative.    Cardiovascular: Negative.   Gastrointestinal:  Positive for nausea.  Genitourinary:  Positive for frequency and urgency.  Musculoskeletal:  Positive for joint pain.  Skin: Negative.   Neurological: Negative.   Endo/Heme/Allergies: Negative.   Psychiatric/Behavioral: Negative.       Exam: Patient Vitals for the past 24 hrs:  BP Temp Temp src Pulse Resp SpO2 Height  Weight  11/06/21 0700 (!) 144/90 -- -- 93 (!) 22 96 % -- --  11/06/21 0645 -- -- -- 92 (!) 24 93 % -- --  11/06/21 0630 133/87 -- -- 93 (!) 25 97 % -- --  11/06/21 0600 126/78 -- -- 94 (!) 24 99 % -- --  11/06/21 0459 -- 98.2 F (36.8 C) Axillary -- -- -- -- --  11/06/21 0430 124/78 -- -- 88 17 100 % -- --  11/06/21 0400 130/76 -- -- 93 16 97 % -- --  11/06/21 0330 (!) 141/77 -- -- 90 (!) 24 95 % -- --  11/06/21 0300 125/78 -- -- 92 (!) 23 92 % -- --  11/06/21 0200 114/71 -- -- 93 (!) 24 94 % -- --  11/06/21 0047 -- 100.1 F (37.8 C) Axillary -- -- -- -- --  11/05/21 2339 -- (!) 102 F (38.9 C) Axillary -- -- -- -- --  11/05/21 2330 116/76 -- -- (!) 109 (!) 26 96 % -- --  11/05/21 2300 117/74 -- -- (!) 110 20 96 % -- --  11/05/21 2230 131/82 -- -- (!) 107 (!) 23 97 % -- --  11/05/21 2213 -- (!) 102.4 F (39.1 C) Axillary -- -- -- -- --  11/05/21 2200 129/79 -- -- (!) 107 (!) 24 97 % -- --  11/05/21 2130 115/61 -- -- (!) 108 (!) 33 97 % -- --  11/05/21 2030 96/68 99.4 F (37.4 C) Oral (!) 127 (!) 21 94 % _0  (1.803 m) 160 lb (72.6 kg)   Physical Exam Vitals reviewed.   HENT:     Head: Normocephalic and atraumatic.  Eyes:     Pupils: Pupils are equal, round, and reactive to light.  Cardiovascular:     Rate and Rhythm: Normal rate and regular rhythm.     Heart sounds: Normal heart sounds.  Pulmonary:     Effort: Pulmonary effort is normal.     Breath sounds: Normal breath sounds.  Abdominal:     General: Bowel sounds are normal.     Palpations: Abdomen is soft.  Musculoskeletal:        General: No tenderness or deformity. Normal range of motion.     Cervical back: Normal range of motion.     Comments:  His left elbow is swollen and red.  He has limited range of motion of the elbow.  Lymphadenopathy:     Cervical: No cervical adenopathy.  Skin:    General: Skin is warm and dry.     Findings: No erythema or rash.  Neurological:     Mental Status: He is alert and oriented to person, place, and time.  Psychiatric:        Behavior: Behavior normal.        Thought Content: Thought content normal.        Judgment: Judgment normal.    Recent Labs    11/05/21 2045 11/06/21 0500  WBC 0.2* 0.2*  HGB 10.8* 8.8*  HCT 31.6* 25.2*  PLT 37* 20*    Recent Labs    11/05/21 2045 11/06/21 0500  NA 132* 135  K 4.8 3.9  CL 100 105  CO2 21* 25  GLUCOSE 259* 163*  BUN 23 16  CREATININE 0.79 0.64  CALCIUM 8.8* 8.1*    Blood smear review: None  Pathology: None    Assessment and Plan: Mr. Bushong is a 66 year old white male.  He has febrile neutropenia.  This is from his  chemotherapy from his non-Hodgkin's lymphoma.  Thankfully, he has completed all of his chemotherapy now.  I think the source of infection could easily be this left elbow.  Again he findings have an arthrocentesis done.  The plan is to have a CT scan done to get a better look at the elbow joint.  He could also have a UTI.  He has indwelling Foley catheter.  He has had issues with Klebsiella in the past.  I totally agree with the broad-spectrum antibiotic coverage.  I would  like to hope that his white cell count will start to come up as his monocytes are quite high yesterday.  It is conceivable that he will need to have a transfusion at some point.  I do believe there is some dehydration.  His hemoglobin might be a little bit lower.  He probably needs to have Orthopedic Surgery take a look at him for the elbow.  Again I suspect that he probably did have fluid taken out of that elbow to see if his infected.  We will follow along.  I just hate the fact that he is back in the hospital.  Lattie Haw, MD  Jeneen Rinks 1:5

## 2021-11-06 NOTE — ED Notes (Signed)
Pt transported to CT scan.

## 2021-11-06 NOTE — Progress Notes (Signed)
Pharmacy Antibiotic Note  Aaron Weaver. is a 66 y.o. male admitted on 11/05/2021 with febrile neutropenia.  Pharmacy has been consulted for vancomycin & meropenem dosing.  febrile neutropenia; ?UTI has foley catheter, recent ESBL in UCx. , L elbow red ? Cellulitis v gout On Bactrim PTA for UTI T 102.4, WBC 0.2 ANC 0 (Chemo 6/23)    Plan: Meropenem 1 gm IV q8h Vancomycin 1500 mg IV q24 x 1 in ED followed by Vancomycin 1000 mg IV q12 for est AUC 457.6 Css min 12 using SCr 0.8, VD 0.72 F/u renal function, WBC, temp, culture data  Height: '5\' 11"'$  (180.3 cm) Weight: 72.6 kg (160 lb) IBW/kg (Calculated) : 75.3  Temp (24hrs), Avg:101.3 F (38.5 C), Min:99.4 F (37.4 C), Max:102.4 F (39.1 C)  Recent Labs  Lab 11/04/21 0900 11/05/21 2045  WBC 1.2* 0.2*  CREATININE 0.75 0.79  LATICACIDVEN  --  2.0*    Estimated Creatinine Clearance: 92 mL/min (by C-G formula based on SCr of 0.79 mg/dL).    Allergies  Allergen Reactions   Fexofenadine Other (See Comments)    "dries me out too much"   Penicillins Hives   Pravastatin Sodium Other (See Comments)    Aches   Requip [Ropinirole Hcl] Other (See Comments)    Bad dreams   Hydrochlorothiazide Other (See Comments)    Cramps    Antimicrobials this admission: 7/1 Cefepime x 1 7/1 Vancomycin>> 7/2 Meropenem>> Dose adjustments this admission:  Microbiology results: 7/1 BCx2: sent 7/1 UCs: sent PTA:  09/20/10 UCx: > 100 K Klebsiella ornighinolytica: sens only to imipenem, nitrofurntion, pip/tazo  Thank you for allowing pharmacy to be a part of this patient's care.  Eudelia Bunch, Pharm.D 11/06/2021 12:11 AM

## 2021-11-06 NOTE — Consult Note (Signed)
ORTHOPAEDIC CONSULTATION  REQUESTING PHYSICIAN: Antonieta Pert, MD  Chief Complaint: Left elbow pain  HPI: Aaron DESIRE Sr. is a 66 y.o. male who presents with fevers chills and a couple of days of worsening posterior elbow pain.  He had seen Dr. Rhona Raider of [facility] orthopedics a few months ago with similar symptoms.  He and his wife think his swelling and pain may have been even worse then.  There was concern for infection at that time.  He was treated with antibiotics and his symptoms resolved.  He is currently being treated for urinary tract infection.  He localizes the pain to the posterior aspect of the left elbow.  He has pain with elbow range of motion.  He denies distal numbness and tingling.  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[place]) 05/04/2021   Dysrhythmia    GERD (gastroesophageal reflux disease)    Impacted cerumen    Impotence of organic origin    Lumbago    Lung nodule    MRSA bacteremia 05/31/2021   Multiple rib fractures 06/28/2021   Neutropenic fever ([place]) 08/13/2021   Other and unspecified hyperlipidemia    Routine general medical examination at a health care facility    Sepsis due to gram-negative UTI ([place]) 08/13/2021   Skipped heart beats    Syncope and collapse 06/28/2021   Tobacco use disorder    Type II or unspecified type diabetes mellitus without mention of complication, not stated as uncontrolled    type II   Unspecified essential hypertension    Past Surgical History:  Procedure Laterality Date   ABDOMINAL AORTIC ENDOVASCULAR STENT GRAFT Bilateral 07/12/2020    Procedure: ABDOMINAL AORTIC ENDOVASCULAR STENT GRAFT;  Surgeon: [name], MD;  Location: [facility];  Service: Vascular;  Laterality: Bilateral;   AXILLARY LYMPH NODE BIOPSY Right 04/27/2021   Procedure: AXILLARY EXCISIONAL LYMPH NODE BIOPSY;  Surgeon: Dwan Bolt, MD;  Location: [place];  Service: General;  Laterality: Right;   BACK SURGERY     fracture   CARPECTOMY Right 09/11/2019   Procedure: PROXIMAL ROW CARPECTOMY; RADIAL STYLOIDECTOMY; POSTERIOR INTEROSSIUS NERVE RESECTION;  Surgeon: Daryll Brod, MD;  Location: [facility];  Service: Orthopedics;  Laterality: Right;  AXILLARY BLOCK   COLONOSCOPY     INGUINAL HERNIA REPAIR     right   IR FLUORO GUIDE CV LINE RIGHT  06/16/2021   IR IMAGING GUIDED PORT INSERTION  07/07/2021   IR US GUIDE VASC ACCESS LEFT  06/16/2021   POLYPECTOMY     PORT-A-CATH REMOVAL N/A 06/01/2021   Procedure: REMOVAL PORT-A-CATH;  Surgeon: Armandina Gemma, MD;  Location: [facility];  Service: General;  Laterality: N/A;   PORTACATH PLACEMENT Right 05/13/2021   Procedure: INSERTION PORT-A-CATH;  Surgeon: Dwan Bolt, MD;  Location: [facility];  Service: General;  Laterality: Right;   TEE WITHOUT CARDIOVERSION N/A 06/06/2021   Procedure: TRANSESOPHAGEAL ECHOCARDIOGRAM (TEE);  Surgeon: Lelon Perla, MD;  Location: [facility];  Service: Cardiovascular;  Laterality: N/A;   TONSILLECTOMY     Social History   Socioeconomic History   Marital status: Married    Spouse name: Aaron Weaver   Number of children: 1   Years of education: Not on file   Highest education level: Not on file  Occupational History   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  Vaping Use   Vaping Use:  Never used  Substance and Sexual Activity   Alcohol use: Yes    Comment: rare beer once or twice   Drug use: No   Sexual activity: Yes  Other Topics Concern   Not on file  Social History Narrative   Not on file   Social Determinants of Health   Financial Resource Strain: Not on file  Food Insecurity: Not on file  Transportation Needs: Not on file  Physical Activity: Not on file  Stress: Not on file  Social Connections: Not on file   Family History  Problem Relation Age of Onset   Colon cancer Mother 18   Hypertension Father    Coronary artery disease Other        male<60 and male<50  1st degree relative   Colon polyps Brother    Esophageal cancer Neg Hx    Rectal cancer Neg Hx    Stomach cancer Neg Hx    Allergies  Allergen Reactions   Fexofenadine Other (See Comments)    "dries me out too much"   Penicillins Hives   Pravastatin Sodium Other (See Comments)    Aches   Requip [Ropinirole Hcl] Other (See Comments)    Bad dreams   Hydrochlorothiazide Other (See Comments)    Cramps     Positive ROS: All other systems have been reviewed and were otherwise negative with the exception of those mentioned in the HPI and as above.  Physical Exam: General: Alert, no acute distress Cardiovascular: No pedal edema Respiratory: No cyanosis, no use of accessory musculature Skin: No lesions in the area of chief complaint Neurologic: Sensation intact distally Psychiatric: Patient is competent for consent with normal mood and affect  Left upper extremity: No traumatic wounds, mild erythema and edema over the posterior aspect of the elbow.  Focally tender about the olecranon  No palpable effusion of the elbow joint or olecranon bursa. Pain with elbow range of motion especially with flexion extension.  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  Small appreciable effusion  of the left elbow and olecranon bursa  Plan: Discussed with the patient that concerns of the location of his pain and immunocompromise state that he may have developed an infection in the left elbow or olecranon bursa. Clinically his symptoms seem to be mostly edema and overlying cellulitis. Using sterile technique attempted an aspiration of both the left elbow joint and the left olecranon bursa using an 18-gauge needle.  No significant fluid was aspirated.  Given the dry tap this is overall reassuring for low suspicion for septic arthritis or septic olecranon bursitis. Recommend antibiotic management for the overlying cellulitis. If has new or worsening symptoms consider ultrasound or IR guided aspiration.   Willaim Sheng, MD  Contact information:   [id number] 7am-5pm epic message Dr. Zachery Dakins, or call office for patient follow up: [phone] After hours and holidays please check [email or web address] for group call information for [facility]

## 2021-11-06 NOTE — Progress Notes (Addendum)
   11/06/21 2006  Assess: MEWS Score  Temp (!) 102.5 F (39.2 C)  BP (!) 172/103  MAP (mmHg) 119  Pulse Rate (!) 115  Resp (!) 30  Level of Consciousness Alert  SpO2 97 %  O2 Device Room Air  Assess: MEWS Score  MEWS Temp 2  MEWS Systolic 0  MEWS Pulse 2  MEWS RR 2  MEWS LOC 0  MEWS Score 6  MEWS Score Color Red  Assess: if the MEWS score is Yellow or Red  Were vital signs taken at a resting state? Yes  Focused Assessment Change from prior assessment (see assessment flowsheet)  Does the patient meet 2 or more of the SIRS criteria? Yes  Does the patient have a confirmed or suspected source of infection? Yes  Treat  Pain Scale 0-10  Pain Score 6  Pain Type Acute pain  Pain Location Elbow  Pain Orientation Left  Pain Descriptors / Indicators Aching  Pain Frequency Constant  Pain Onset On-going  Patients Stated Pain Goal 3  Pain Intervention(s) Medication (See eMAR)  Multiple Pain Sites No  Complains of Fever  Interventions Medication (see MAR)  Take Vital Signs  Increase Vital Sign Frequency  Red: Q 1hr X 4 then Q 4hr X 4, if remains red, continue Q 4hrs  Escalate  MEWS: Escalate Red: discuss with charge nurse/RN and provider, consider discussing with RRT  Notify: Charge Nurse/RN  Name of Charge Nurse/RN Notified Kristine, RN  Date Charge Nurse/RN Notified 11/06/21  Time Charge Nurse/RN Notified 2013  Notify: Provider  Provider Name/Title E. Stark Klein, NP  Date Provider Notified 11/06/21  Time Provider Notified 2013  Method of Notification Page  Notification Reason Change in status  Assess: SIRS CRITERIA  SIRS Temperature  1  SIRS Pulse 1  SIRS Respirations  1  SIRS WBC 0  SIRS Score Sum  3   Tylenol administered for fever, Toradol administered for pain.  Denies nausea.  No evidence of AMS.  RR RN notified of change in status.  Angie Fava, RN

## 2021-11-06 NOTE — Progress Notes (Addendum)
PROGRESS NOTE Aaron Weaver  ZJQ:[id number] DOB: 1956/01/01 DOA: 11/05/2021 PCP: [name], MD   Brief Narrative/Hospital Course: 16yom w/ significant history of lymphoma, pancytopenia, diabetes, hyperlipidemia, RLS, anxiety, depression, hypertension, COPD, atrial fibrillation, urinary retention, GERD, AAA presented 1 to 2 days of weakness body aches subjective fever, fairly similar to previous episodes of sepsis, also had redness at left elbow, has been on chronic Bactrim and Macrobid for UTI suppression with indwelling Foley, and famciclovir, last cycle of chemo was recently 5 days PTA and had 2 units of PRBC and pegfilgrastim. In the ED felt to be in sepsis with fever, neutropenia, tachycardia.  Labs with hyperglycemia, lactic acidosis, thrombocytopenia anemia.Urinalysis with bacteria but otherwise unable to interpret due to pigment/blood.  Urine culture and blood culture pending.  Chest x-ray with left basilar scarring. He also tested positive for COVID on admission and had tested +5 months ago and 4 months ago.  Blood cultures were sent, started on meropenem, valacyclovir, vancomycin.  He has history of resistant Klebsiella. He has some skin changes on the left elbow, questionable cellulitis.    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  Has a port in place for ischemia but looks like he finished 8 wonder if it can be removed.  Oncology and ID consulted.  Chest x-ray with scarring.Urinalysis with bacteria but otherwise unable to interpret due to pigment/blood.Urine culture and blood culture pending.   For now cont meropenem, valacyclovir, vancomycin.  Awaiting ID recommendation. He has history of resistant Klebsiella. He has some skin changes on the left elbow, questionable cellulitis-CT scan elbow pending.  CT reviewed- spoke w/ Dr Zachery Dakins form Ortho- he will come and see- possibly tap the elbow.  COVID-positive has been positive in the past 4 months and 5 months ago, CT value 32.6, ID consulted for now we will continue empiric airborne precaution.  Pending ID further inputs  Pancytopenia in the setting of chemo use with thrombocytopenia anemia leukopenia:last cycle of chemo was recently 5 days PTA and had 2 units of PRBC and pegfilgrastim.  Oncology on board, continue antibiotics as 1 monitor count transfusion as needed.  No active bleeding on the platelet low. Recent Labs  Lab 11/04/21 0900 11/05/21 2045 11/06/21 0500  HGB 11.2* 10.8* 8.8*  HCT 32.9* 31.6* 25.2*  WBC 1.2* 0.2* 0.2*  PLT 60* 37* 20*   Lymphoma followed by Dr. Marin Olp who is on board, ER final dose of R-CHOP recently. Diabetes mellitus on long-term insulin continue 15 units long-acting SSI for now at home on 25 units.  Has uncontrolled hyperglycemia Recent Labs  Lab 11/05/21 2128 11/06/21 0140 11/06/21 0807 11/06/21 1148  GLUCAP 285* 205* 181* 221*   Hyperlipidemia on  Crestor GERD on PPI Urine retention continue Flomax, and has indwelling catheter in place Hypertension holding home meds in the setting of sepsis. Gout resume allopurinol. Anxiety/depression/RLS:continue patient's Ativan, Cymbalta.  AAA: CT 06/2021: Stable aortic  endograft with prior EVAR- stable excluded aneurysm sac size of approximately 3.3 x 4.2 cm without evidence of endoleak on delayed images. COPD: Continue bronchodilators as needed.  Paroxysmal A-fib HX: Rate controlled.  Not on anticoagulation due to severe thrombocytopenia.  He will need outpatient follow-up with cardiology  DVT prophylaxis: SCDs Start: 11/05/21 2355 Code Status:   Code Status: Full Code Family Communication: plan of care discussed with patient at bedside. Patient status is: Admitted as observation but remains hospitalized for ongoing IV antibiotics and febrile neutropenia and will need at least 2 midnight stay.  Level of care: Progressive   Dispo: The patient is from: HOME            Anticipated disposition: tbd  Mobility Assessment (last 72 hours)     Mobility Assessment   No documentation.            Objective: Vitals last 24 hrs: Vitals:   11/06/21 0645 11/06/21 0700 11/06/21 1000 11/06/21 1030  BP:  (!) 144/90 (!) 138/98 (!) 150/101  Pulse: 92 93 100 98  Resp: (!) 24 (!) 22 (!) 22 19  Temp:      TempSrc:      SpO2: 93% 96% 98% 97%  Weight:      Height:       Weight change:   Physical Examination: General exam: alert awake,older than stated age, weak appearing. HEENT:Oral mucosa moist, Ear/Nose WNL grossly, dentition normal. Respiratory system: bilaterally clear BS, no use of accessory muscle Cardiovascular system: S1 & S2 +, No JVD. Gastrointestinal system: Abdomen soft,NT,ND, BS+ Nervous System:Alert, awake, moving extremities and grossly nonfocal Extremities: LE edema neg, left elbow tender and erythematous Skin: No rashes,no icterus. MSK: Normal muscle bulk,tone, power  Medications reviewed: Scheduled Meds:  allopurinol  100 mg Oral Daily   [START ON 11/07/2021] calcium carbonate  600 mg of elemental calcium Oral Q breakfast   DULoxetine  60 mg Oral QHS   insulin aspart  0-15 Units Subcutaneous TID WC   insulin aspart  0-5 Units  Subcutaneous QHS   insulin glargine-yfgn  15 Units Subcutaneous Daily   LORazepam  0.5 mg Oral Q8H   pantoprazole  40 mg Oral Daily   rosuvastatin  10 mg Oral Daily   sodium chloride flush  3 mL Intravenous Q12H   tamsulosin  0.4 mg Oral QPM   valACYclovir  1,000 mg Oral Daily   Continuous Infusions:  lactated ringers     meropenem (MERREM) IV     [START ON 11/07/2021] vancomycin        Diet Order             Diet heart healthy/carb modified Room service appropriate? Yes; Fluid consistency: Thin  Diet effective now                            Intake/Output Summary (Last 24 hours) at 11/06/2021 1157 Last data filed at 11/06/2021 0304 Gross per 24 hour  Intake 3750 ml  Output --  Net 3750 ml   Net IO Since Admission: 3,750 mL [11/06/21 1157]  Wt Readings from Last 3 Encounters:  11/05/21 72.6 kg  10/28/21 73.2 kg  10/07/21 75.5 kg     Unresulted Labs (From admission, onward)  Start     Ordered   11/07/21 0500  CBC with Differential/Platelet  Daily,   R      11/06/21 0918   11/07/21 0500  Comprehensive metabolic panel  Daily,   R      11/06/21 0918   11/06/21 0624  Pathologist smear review  Once,   R        11/06/21 0623   11/05/21 2119  Urine Culture  Once,   URGENT       Question:  Indication  Answer:  Dysuria   11/05/21 2118          Data Reviewed: I have personally reviewed following labs and imaging studies CBC: Recent Labs  Lab 11/04/21 0900 11/05/21 2045 11/06/21 0500  WBC 1.2* 0.2* 0.2*  NEUTROABS 1.0* 0.0*  --   HGB 11.2* 10.8* 8.8*  HCT 32.9* 31.6* 25.2*  MCV 89.2 90.0 88.4  PLT 60* 37* 20*   Basic Metabolic Panel: Recent Labs  Lab 11/04/21 0900 11/05/21 2045 11/06/21 0500  NA 131* 132* 135  K 4.8 4.8 3.9  CL 97* 100 105  CO2 26 21* 25  GLUCOSE 368* 259* 163*  BUN '23 23 16  '$ CREATININE 0.75 0.79 0.64  CALCIUM 9.1 8.8* 8.1*   GFR: Estimated Creatinine Clearance: 92 mL/min (by C-G formula based on SCr of 0.64 mg/dL). Liver  Function Tests: Recent Labs  Lab 11/04/21 0900 11/05/21 2045 11/06/21 0500  AST 17 16 12*  ALT 38 28 22  ALKPHOS 65 58 44  BILITOT 0.7 1.1 0.7  PROT 6.2* 6.7 5.3*  ALBUMIN 3.8 3.4* 2.6*   No results for input(s): "LIPASE", "AMYLASE" in the last 168 hours. No results for input(s): "AMMONIA" in the last 168 hours. Coagulation Profile: Recent Labs  Lab 11/05/21 2045  INR 1.0   BNP (last 3 results) No results for input(s): "PROBNP" in the last 8760 hours. HbA1C: No results for input(s): "HGBA1C" in the last 72 hours. CBG: Recent Labs  Lab 11/05/21 2128 11/06/21 0140 11/06/21 0807 11/06/21 1148  GLUCAP 285* 205* 181* 221*   Lipid Profile: No results for input(s): "CHOL", "HDL", "LDLCALC", "TRIG", "CHOLHDL", "LDLDIRECT" in the last 72 hours. Thyroid Function Tests: No results for input(s): "TSH", "T4TOTAL", "FREET4", "T3FREE", "THYROIDAB" in the last 72 hours. Sepsis Labs: Recent Labs  Lab 11/05/21 0112 11/05/21 2045  LATICACIDVEN 1.1 2.0*    Recent Results (from the past 240 hour(s))  Culture, blood (Routine x 2)     Status: None (Preliminary result)   Collection Time: 11/05/21  8:45 PM   Specimen: BLOOD  Result Value Ref Range Status   Specimen Description   Final    BLOOD BLOOD RIGHT HAND Performed at [facility] [address], [place], [place] [address]    Special Requests   Final    BOTTLES DRAWN AEROBIC AND ANAEROBIC Blood Culture results may not be optimal due to an inadequate volume of blood received in culture bottles Performed at [facility] [address], [place], [place] [address]    Culture   Final    NO GROWTH < 12 HOURS Performed at [facility] [address], [place], [place] [address]    Report Status PENDING  Incomplete  Culture, blood (Routine x 2)     Status: None (Preliminary result)   Collection Time: 11/05/21 10:39 PM   Specimen: BLOOD  Result Value Ref Range Status   Specimen  Description   Final  BLOOD RIGHT PORTA CATH Performed at [facility], [place] [address], [place], [place] [address]    Special Requests   Final    BOTTLES DRAWN AEROBIC AND ANAEROBIC Blood Culture adequate volume Performed at [facility] [address], [place], [place] [address]    Culture   Final    NO GROWTH < 12 HOURS Performed at [facility] [address], [place], [place] [address]    Report Status PENDING  Incomplete  Resp Panel by RT-PCR (Flu A&B, Covid) Anterior Nasal Swab     Status: Abnormal   Collection Time: 11/05/21 11:20 PM   Specimen: Anterior Nasal Swab  Result Value Ref Range Status   SARS Coronavirus 2 by RT PCR POSITIVE (A) NEGATIVE Final    Comment: (NOTE) SARS-CoV-2 target nucleic acids are DETECTED.  The SARS-CoV-2 RNA is generally detectable in upper respiratory specimens during the acute phase of infection. Positive results are indicative of the presence of the identified virus, but do not rule out bacterial infection or co-infection with other pathogens not detected by the test. Clinical correlation with patient history and other diagnostic information is necessary to determine patient infection status. The expected result is Negative.  Fact Sheet for Patients: EntrepreneurPulse.com.au  Fact Sheet for Healthcare Providers: IncredibleEmployment.be  This test is not yet approved or cleared by the [place] FDA and  has been authorized for detection and/or diagnosis of SARS-CoV-2 by FDA under an Emergency Use Authorization (EUA).  This EUA will remain in effect (meaning this test can be used) for the duration of  the COVID-19 declaration under Section 564(b)(1) of the A ct, 21 U.S.C. section 360bbb-3(b)(1), unless the authorization is terminated or revoked sooner.     Influenza A by PCR NEGATIVE NEGATIVE Final   Influenza B by PCR NEGATIVE NEGATIVE Final     Comment: (NOTE) The Xpert Xpress SARS-CoV-2/FLU/RSV plus assay is intended as an aid in the diagnosis of influenza from Nasopharyngeal swab specimens and should not be used as a sole basis for treatment. Nasal washings and aspirates are unacceptable for Xpert Xpress SARS-CoV-2/FLU/RSV testing.  Fact Sheet for Patients: EntrepreneurPulse.com.au  Fact Sheet for Healthcare Providers: IncredibleEmployment.be  This test is not yet approved or cleared by the [place] FDA and has been authorized for detection and/or diagnosis of SARS-CoV-2 by FDA under an Emergency Use Authorization (EUA). This EUA will remain in effect (meaning this test can be used) for the duration of the COVID-19 declaration under Section 564(b)(1) of the Act, 21 U.S.C. section 360bbb-3(b)(1), unless the authorization is terminated or revoked.  Performed at [facility], [place] [address], [place], [place] [address]     Antimicrobials: Anti-infectives (From admission, onward)    Start     Dose/Rate Route Frequency Ordered Stop   11/07/21 1200  vancomycin (VANCOCIN) IVPB 1000 mg/200 mL premix        1,000 mg 200 mL/hr over 60 Minutes Intravenous Every 12 hours 11/06/21 0015     11/06/21 1000  valACYclovir (VALTREX) tablet 1,000 mg        1,000 mg Oral Daily 11/05/21 2356     11/06/21 1000  meropenem (MERREM) 1 g in sodium chloride 0.9 % 100 mL IVPB        1 g 200 mL/hr over 30 Minutes Intravenous Every 8 hours 11/06/21 0015     11/06/21 0030  vancomycin (VANCOREADY) IVPB 1500 mg/300 mL        1,500 mg  150 mL/hr over 120 Minutes Intravenous  Once 11/05/21 2333 11/06/21 0304   11/05/21 2345  meropenem (MERREM) 1 g in sodium chloride 0.9 % 100 mL IVPB        1 g 200 mL/hr over 30 Minutes Intravenous  Once 11/05/21 2333 11/06/21 0304   11/05/21 2245  ceFEPIme (MAXIPIME) 2 g in sodium chloride 0.9 % 100 mL IVPB        2 g 200 mL/hr over 30 Minutes Intravenous   Once 11/05/21 2234 11/06/21 0028      Culture/Microbiology    Component Value Date/Time   SDES  11/05/2021 2239    BLOOD RIGHT PORTA CATH Performed at [facility], [place] [address] [place], [place] [address]    SPECREQUEST  11/05/2021 2239    BOTTLES DRAWN AEROBIC AND ANAEROBIC Blood Culture adequate volume Performed at [facility] [address], [place], [place] [address]    CULT  11/05/2021 2239    NO GROWTH < 12 HOURS Performed at [facility] [address] [place], [place] [address]    REPTSTATUS PENDING 11/05/2021 2239    Radiology Studies: DG Chest 2 View  Result Date: 11/05/2021 CLINICAL DATA:  Generalized weakness EXAM: CHEST - 2 VIEW COMPARISON:  08/13/2021 FINDINGS: Cardiac shadow is stable. Right chest wall port is stable. Linear scarring in the left base is again noted. No sizable effusion is seen. Changes of prior vertebral augmentation are noted. IMPRESSION: Left basilar scarring. Electronically Signed   By: Inez Catalina M.D.   On: 11/05/2021 20:55     LOS: 0 days   Antonieta Pert, MD [facility]  11/06/2021, 11:57 AM

## 2021-11-06 NOTE — Progress Notes (Signed)
  CROSS COVER NOTE  Subjective: Notified by patient's RN of MEWS score in the RED due to abnormal VS.  Objective: On review of flow sheet, the temperature was 102.5 F (39.2 C), the heart rate 115 beats/minute, the blood pressure 172/103 mm Hg, the respiratory rate 30 breaths/minute, and the oxygen saturation 97% on RA. Patient c/o of acute left elbow pain rating 6/10.  7/2: CT Elbow Left>. Non focal subcutaneous edema posteriorly, suggesting soft tissue infection (cellulitis) with possible olecranon bursitis. 2. Nonspecific moderate-sized elbow joint effusion. Cannot exclude septic arthritis. 3. No evidence of soft tissue abscess, osteomyelitis or acute osseous abnormality.  Assessment & Plan 66 y.o. 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  Sepsis without septic shock due to suspected Left Elbow Cellulitis/ Septic Arthritis and possible Arthritis & COVID Infection. -Supplemental oxygen as needed, to maintain SpO2 > 90% -F/u cultures, trend lactic/ PCT -Monitor WBC/ fever curve -Continue with current meropenem and vancomycin pending cultures in the setting of neutropenic fever -IVF hydration as needed -PRN Pain management -Consider Ortho consult for possible Arthrocentesis with synovial fluid analysis if appropriate -ID following, recs appreciated   Rufina Falco, DNP, CCRN, FNP-C, AGACNP-BC Acute Care & Family Nurse Practitioner  [facility]  See Amion for personal pager PCCM on call pager [phone] until 7 am

## 2021-11-06 NOTE — Consult Note (Signed)
[facility] for Infectious Disease    Date of Admission:  11/05/2021     Reason for Consult: Neutropenic fever     Referring Physician: Dr Lupita Leash  Current antibiotics: Meropenem  Vancomycin    ASSESSMENT:    66 y.o. male admitted with:  Neutropenic fever:  ANC is 0 in the setting of recent chemotherapy.  Possible sources include UTI with chronic Foley catheter, left elbow source, or CLABSI.  He reports his Foley was last changed about 3 weeks ago and is changed monthly.  There have been no issues per his report with the catheter and he has no symptoms. Would be most suspicious for infection involving the left elbow given the redness, TTP, and swelling.  CT is pending.  Certainly, a CLABSI could be a distinct possibility. COVID positivity: He has no respiratory symptoms and CXR is clear. His COVID PCR has been positive in January, February, and again last night.  His cycle threshold is 32.6 and in February was 22.1.  There are no other Ct values available in micro from prior testing.  Our Ct cutoff in the lab is 44 where a test would no longer be positive.  I suspect that he may have prolonged shedding of non-viable virus as his value of 32.6 is pretty high and he has no symptoms.  Will leave on isolation for the time being and plan to discuss with infection prevention tomorrow about removing given lower suspicion for an active process.  Sepsis: Due to #1. Lymphoma: Status post R-CHOP chemotherapy with last cycle 10/28/21. Chronic Foley catheter: Due to urinary retention. Type 2 Diabetes: A1c is 9.1 in January 2023. Pancytopenia: Due to chemotherapy.  RECOMMENDATIONS:    Continue with current meropenem and vancomycin pending cultures in the setting of neutropenic fever Clarify need for ongoing PAC.  If this is still needed, then can follow up blood cultures.  If blood cultures become positive, then will likely need to be removed. Follow up CT scan elbow and ortho recs His urine  culture will likely grow an organism but it's significance should be interpreted with caution keeping in mind urinary colonization with a Foley is common and he has no urinary symptoms Will maintain COVID isolation for now and discuss with IP tomorrow  Dr [place] Bali here tomorrow  Principal Problem:   Febrile neutropenia ([place]) Active Problems:   DM2 (diabetes mellitus, type 2) ([place])   Hyperlipidemia   Anxiety   Depression   Essential hypertension   Restless leg syndrome   AAA (abdominal aortic aneurysm) without rupture (HCC)   COPD (chronic obstructive pulmonary disease) (HCC)   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)   Pancytopenia (HCC)   Paroxysmal A-fib (HCC)   GERD (gastroesophageal reflux disease)   Chronic indwelling Foley catheter   Sepsis ([place])   MEDICATIONS:    Scheduled Meds:  DULoxetine  60 mg Oral QHS   insulin aspart  0-15 Units Subcutaneous TID WC   insulin aspart  0-5 Units Subcutaneous QHS   insulin glargine-yfgn  15 Units Subcutaneous Daily   LORazepam  0.5 mg Oral Q8H   pantoprazole  40 mg Oral Daily   rosuvastatin  10 mg Oral Daily   sodium chloride flush  3 mL Intravenous Q12H   tamsulosin  0.4 mg Oral QPM   valACYclovir  1,000 mg Oral Daily   Continuous Infusions:  lactated ringers 150 mL/hr at 11/06/21 0106   meropenem (MERREM) IV     [  START ON 11/07/2021] vancomycin     PRN Meds:.acetaminophen **OR** acetaminophen, albuterol, HYDROcodone-acetaminophen, polyethylene glycol  HPI:    Aaron LAZARO Sr. is a 66 y.o. 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  He presented to the ED at [facility] last night with body aches and fever.   He had blood cultures drawn x 2.  One from [facility] and another from [facility].  Urine cultures are also pending.  His COVID PCR remains positive.  CXR was clear and he has no respiratory symptoms.  He has no urinary or GI  complaints. No mucositis or mouth sores.  He does have some swelling and redness of his elbow.  His ANC is 0 and he was found to be febrile to 102.4.  He was given vancomycin and meropenem given history of ESBL in the setting of a chronic Foley catheter.  He is apparently on prophylactic Bactrim and Macrobid for his history of UTI.   He also has a history of MRSA bacteremia due to CLABSI earlier this year.    We have been consulted for antibiotic recommendations.    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 ([place]) 05/04/2021   Dysrhythmia    GERD (gastroesophageal reflux disease)    Impacted cerumen    Impotence of organic origin    Lumbago    Lung nodule    MRSA bacteremia 05/31/2021   Multiple rib fractures 06/28/2021   Neutropenic fever ([place]) 08/13/2021   Other and unspecified hyperlipidemia    Routine general medical examination at a health care facility    Sepsis due to gram-negative UTI ([place]) 08/13/2021   Skipped heart beats    Syncope and collapse 06/28/2021   Tobacco use disorder    Type II or unspecified type diabetes mellitus without mention of complication, not stated as uncontrolled    type II   Unspecified essential hypertension     Social History   Tobacco Use   Smoking status: Former    Packs/day: 0.50    Years: 53.00    Total pack years: 26.50    Types: Cigarettes    Start date: 03/31/1965    Quit date: 07/22/2017    Years since quitting: 4.2   Smokeless tobacco: Never   Tobacco comments:    started at age 32.  Vaping Use   Vaping Use: Never used  Substance Use Topics   Alcohol use: Yes    Comment: rare beer  once or twice   Drug use: No    Family History  Problem Relation Age of Onset   Colon cancer Mother 61   Hypertension Father    Coronary artery disease Other        male<60 and male<50  1st degree relative   Colon polyps Brother    Esophageal cancer Neg Hx    Rectal cancer Neg Hx    Stomach cancer Neg Hx     Allergies  Allergen Reactions   Fexofenadine Other (See  Comments)    "dries me out too much"   Penicillins Hives   Pravastatin Sodium Other (See Comments)    Aches   Requip [Ropinirole Hcl] Other (See Comments)    Bad dreams   Hydrochlorothiazide Other (See Comments)    Cramps    Review of Systems  Constitutional:  Positive for fever and malaise/fatigue.  Eyes: Negative.   Cardiovascular: Negative.   Gastrointestinal: Negative.   Genitourinary: Negative.   Musculoskeletal:  Positive for joint pain.  All other systems reviewed and are negative.   OBJECTIVE:   Blood pressure (!) 144/90, pulse 93, temperature 98.2 F (36.8 C), temperature source Axillary, resp. rate (!) 22, height '5\' 11"'$  (1.803 m), weight 72.6 kg, SpO2 96 %. Body mass index is 22.32 kg/m.  Physical Exam Constitutional:      General: He is not in acute distress.    Appearance: Normal appearance.  HENT:     Head: Normocephalic and atraumatic.     Mouth/Throat:     Comments: He is wearing a mask . Eyes:     Extraocular Movements: Extraocular movements intact.     Conjunctiva/sclera: Conjunctivae normal.  Cardiovascular:     Comments: Right sided PAC is non-tender, non-erythematous.  Pulmonary:     Effort: Pulmonary effort is normal. No respiratory distress.  Abdominal:     General: There is no distension.     Palpations: Abdomen is soft.     Tenderness: There is no abdominal tenderness.  Musculoskeletal:        General: Swelling and tenderness present.     Cervical back: Normal range of motion and neck supple.  Skin:    General: Skin is dry.     Findings: Erythema present.   Neurological:     General: No focal deficit present.     Mental Status: He is alert and oriented to person, place, and time.  Psychiatric:        Mood and Affect: Mood normal.        Behavior: Behavior normal.      Lab Results: Lab Results  Component Value Date   WBC 0.2 (LL) 11/06/2021   HGB 8.8 (L) 11/06/2021   HCT 25.2 (L) 11/06/2021   MCV 88.4 11/06/2021   PLT 20 (LL) 11/06/2021    Lab Results  Component Value Date   NA 135 11/06/2021   K 3.9 11/06/2021   CO2 25 11/06/2021   GLUCOSE 163 (H) 11/06/2021   BUN 16 11/06/2021   CREATININE 0.64 11/06/2021   CALCIUM 8.1 (L) 11/06/2021   GFRNONAA >60 11/06/2021   GFRAA >60 09/08/2019    Lab Results  Component Value Date   ALT 22 11/06/2021   AST 12 (L) 11/06/2021   ALKPHOS 44 11/06/2021   BILITOT 0.7 11/06/2021       Component Value Date/Time   CRP 6.3 (H) 06/04/2021 0403       Component Value Date/Time   ESRSEDRATE 5 03/22/2021 1452    I have reviewed the micro and lab results in Epic.  Imaging: DG Chest 2 View  Result Date: 11/05/2021 CLINICAL DATA:  Generalized weakness EXAM: CHEST - 2 VIEW COMPARISON:  08/13/2021 FINDINGS: Cardiac shadow is stable. Right chest wall port is stable. Linear scarring in the left base is again noted. No sizable effusion is seen. Changes of prior vertebral augmentation are noted. IMPRESSION: Left basilar scarring. Electronically Signed   By: Inez Catalina M.D.   On: 11/05/2021 20:55     Imaging independently reviewed  in [place].  [facility] for Infectious Disease [facility] [phone] pager 11/06/2021, 10:20 AM

## 2021-11-06 NOTE — Progress Notes (Signed)
HOSPITAL MEDICINE OVERNIGHT EVENT NOTE    Nursing contacted me to report critical lab value of a platelet count of 20 this morning.  Chart reviewed, patient currently being treated for sepsis and also actively receives chemotherapy.  Therefore thrombocytopenia is likely multifactorial in origin.  We will obtain a peripheral smear.  Nursing reports no clinical evidence of bleeding.  Treating underlying sepsis and supportive care are likely the modality of therapy here.  If platelet count drops below 10 or patient exhibits any evidence of bleeding will consider initiation of platelet transfusion.  Vernelle Emerald  MD [facility]

## 2021-11-06 NOTE — ED Notes (Addendum)
Per Tanzania H., RN this pt has been testing positive for COVID over the past year, therefore the provider does not require the pt to be on isolation.

## 2021-11-06 NOTE — Hospital Course (Signed)
30yom w/ significant history of lymphoma, pancytopenia, diabetes, hyperlipidemia, RLS, anxiety, depression, hypertension, COPD, atrial fibrillation, urinary retention, GERD, AAA presented 1 to 2 days of weakness body aches subjective fever, fairly similar to previous episodes of sepsis, also had redness at left elbow, has been on chronic Bactrim and Macrobid for UTI suppression with indwelling Foley, and famciclovir, last cycle of chemo was recently 5 days PTA and had 2 units of PRBC and pegfilgrastim. In the ED felt to be in sepsis with fever, neutropenia, tachycardia.  Labs with hyperglycemia, lactic acidosis, thrombocytopenia anemia.Urinalysis with bacteria but otherwise unable to interpret due to pigment/blood.  Urine culture and blood culture pending.  Chest x-ray with left basilar scarring. He also tested positive for COVID on admission and had tested +5 months ago and 4 months ago.  Blood cultures were sent, started on meropenem, valacyclovir, vancomycin.  He has history of resistant Klebsiella. He has some skin changes on the left elbow, questionable cellulitis.

## 2021-11-07 ENCOUNTER — Inpatient Hospital Stay: Payer: Medicare Other

## 2021-11-07 DIAGNOSIS — D709 Neutropenia, unspecified: Secondary | ICD-10-CM | POA: Diagnosis not present

## 2021-11-07 DIAGNOSIS — R5081 Fever presenting with conditions classified elsewhere: Secondary | ICD-10-CM | POA: Diagnosis not present

## 2021-11-07 LAB — COMPREHENSIVE METABOLIC PANEL
ALT: 22 U/L (ref 0–44)
AST: 16 U/L (ref 15–41)
Albumin: 2.8 g/dL — ABNORMAL LOW (ref 3.5–5.0)
Alkaline Phosphatase: 46 U/L (ref 38–126)
Anion gap: 9 (ref 5–15)
BUN: 15 mg/dL (ref 8–23)
CO2: 23 mmol/L (ref 22–32)
Calcium: 8 mg/dL — ABNORMAL LOW (ref 8.9–10.3)
Chloride: 101 mmol/L (ref 98–111)
Creatinine, Ser: 0.5 mg/dL — ABNORMAL LOW (ref 0.61–1.24)
GFR, Estimated: >60 mL/min (ref >60)
Glucose, Bld: 161 mg/dL — ABNORMAL HIGH (ref 70–99)
Potassium: 3.9 mmol/L (ref 3.5–5.1)
Sodium: 133 mmol/L — ABNORMAL LOW (ref 135–145)
Total Bilirubin: 0.9 mg/dL (ref 0.3–1.2)
Total Protein: 5.9 g/dL — ABNORMAL LOW (ref 6.5–8.1)

## 2021-11-07 LAB — CBC WITH DIFFERENTIAL/PLATELET
Abs Immature Granulocytes: 0 10*3/uL (ref 0.00–0.07)
Basophils Absolute: 0 10*3/uL (ref 0.0–0.1)
Basophils Relative: 0 %
Eosinophils Absolute: 0 10*3/uL (ref 0.0–0.5)
Eosinophils Relative: 0 %
HCT: 24.5 % — ABNORMAL LOW (ref 39.0–52.0)
Hemoglobin: 8.6 g/dL — ABNORMAL LOW (ref 13.0–17.0)
Immature Granulocytes: 0 %
Lymphocytes Relative: 29 %
Lymphs Abs: 0.1 10*3/uL — ABNORMAL LOW (ref 0.7–4.0)
MCH: 30.9 pg (ref 26.0–34.0)
MCHC: 35.1 g/dL (ref 30.0–36.0)
MCV: 88.1 fL (ref 80.0–100.0)
Monocytes Absolute: 0.1 10*3/uL (ref 0.1–1.0)
Monocytes Relative: 16 %
Neutro Abs: 0.3 10*3/uL — CL (ref 1.7–7.7)
Neutrophils Relative %: 55 %
Platelets: 17 10*3/uL — CL (ref 150–400)
RBC: 2.78 MIL/uL — ABNORMAL LOW (ref 4.22–5.81)
RDW: 15.7 % — ABNORMAL HIGH (ref 11.5–15.5)
WBC: 0.5 10*3/uL — CL (ref 4.0–10.5)
nRBC: 0 % (ref 0.0–0.2)

## 2021-11-07 LAB — GLUCOSE, CAPILLARY
Glucose-Capillary: 156 mg/dL — ABNORMAL HIGH (ref 70–99)
Glucose-Capillary: 271 mg/dL — ABNORMAL HIGH (ref 70–99)
Glucose-Capillary: 241 mg/dL — ABNORMAL HIGH (ref 70–99)
Glucose-Capillary: 255 mg/dL — ABNORMAL HIGH (ref 70–99)

## 2021-11-07 LAB — PATHOLOGIST SMEAR REVIEW

## 2021-11-07 LAB — MRSA NEXT GEN BY PCR, NASAL: MRSA by PCR Next Gen: NOT DETECTED

## 2021-11-07 MED ORDER — ENSURE ENLIVE PO LIQD
237.0000 mL | Freq: Two times a day (BID) | ORAL | Status: DC
  Administered 2021-11-07 – 2021-11-10 (×4): 237 mL via ORAL

## 2021-11-07 MED ORDER — ADULT MULTIVITAMIN W/MINERALS CH
1.0000 | ORAL_TABLET | Freq: Every day | ORAL | Status: DC
  Administered 2021-11-07 – 2021-11-10 (×4): 1 via ORAL
  Filled 2021-11-07 (×4): qty 1

## 2021-11-07 MED ORDER — INSULIN GLARGINE-YFGN 100 UNIT/ML ~~LOC~~ SOLN
25.0000 [IU] | Freq: Every day | SUBCUTANEOUS | Status: DC
  Administered 2021-11-07 – 2021-11-10 (×4): 25 [IU] via SUBCUTANEOUS
  Filled 2021-11-07 (×4): qty 0.25

## 2021-11-07 NOTE — TOC Initial Note (Signed)
Transition of Care (TOC) - Initial/Assessment Note    Patient Details  Name: Aaron GILL Sr. MRN: [id number] Date of Birth: [birth year]  Transition of Care [facility]) CM/SW Contact:    Joaquin Courts, RN Phone Number: 11/07/2021, 9:26 AM  Clinical Narrative:                     Transition of Care Department [facility]) has reviewed patient and no TOC needs have been identified at this time. We will continue to monitor patient advancement through interdisciplinary progression rounds. If new patient transition needs arise, please place a TOC consult.

## 2021-11-07 NOTE — Progress Notes (Addendum)
Nurse notified provider Stark Klein, NP that patient's morning labs have resulted. WBC and platelets are critical values. Provider Stark Klein, NP made aware. No new orders at this time. Will continue to monitor.

## 2021-11-07 NOTE — Progress Notes (Signed)
PROGRESS NOTE Aaron Weaver  EGB:[id number] DOB: [birth year] DOA: 11/05/2021 PCP: [name], MD   Brief Narrative/Hospital Course: 80yom w/ significant history of lymphoma, pancytopenia, diabetes, hyperlipidemia, RLS, anxiety, depression, hypertension, COPD, atrial fibrillation, urinary retention, GERD, AAA presented 1 to 2 days of weakness body aches subjective fever, fairly similar to previous episodes of sepsis, also had redness at left elbow, has been on chronic Bactrim and Macrobid for UTI suppression with indwelling Foley, and famciclovir, last cycle of chemo was recently 5 days PTA and had 2 units of PRBC and pegfilgrastim. In the ED felt to be in sepsis with fever, neutropenia, tachycardia.  Labs with hyperglycemia, lactic acidosis, thrombocytopenia anemia.Urinalysis with bacteria but otherwise unable to interpret due to pigment/blood.  Urine culture and blood culture pending.  Chest x-ray with left basilar scarring. He also tested positive for COVID on admission and had tested +5 months ago and 4 months ago.  Blood cultures were sent, started on meropenem, valacyclovir, vancomycin.  He has history of resistant Klebsiella. He has some skin changes on the left elbow, questionable cellulitis.    Subjective: Seen and examined this morning.  Left elbow redness and pain is improving no cough chills shortness of breath Overnight 102.5 at 8 pm. 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 Activity unable to tap left, low suspicion for septic arthritis or septic olecranon bursitis.  Lower lobe pneumonia.ID, oncology on board. CXR with scarring.Urinalysis with bacteria but otherwise unable to interpret due to pigment/blood.Urine culture and blood culture pending. CT elbow reviewed. Cont on Meropenem, valacyclovir, vancomycin for now.  COVID-positive has been positive in the past 4 months and 5 months ago,CT value 32.6, cut off is 44- so cont isolation for now as per ID.  No respiratory syndrome.    Pancytopenia in the setting of chemo use with thrombocytopenia anemia leukopenia:last cycle of chemo was recently 5 days PTA and had 2 units of PRBC and pegfilgrastim.  Oncology on board platelet continues to downtrend otherwise hemoglobin stable and WBC improving as above hematology Recent Labs  Lab 11/05/21 2045 11/06/21 0500 11/07/21 0434  HGB 10.8* 8.8* 8.6*  HCT 31.6* 25.2* 24.5*  WBC 0.2* 0.2* 0.5*  PLT 37* 20* 17*   Lymphoma followed by Dr. Marin Olp who is on board, ER final dose of R-CHOP recently. Diabetes mellitus on long-term insulin poorly controlled, increase lantus to 25 units. Cont SI for now at home on 25 units.  Has uncontrolled hyperglycemia Recent Labs  Lab 11/06/21 0140 11/06/21 0807 11/06/21 1148 11/06/21 1643 11/06/21 2015  GLUCAP 205* 181* 221* 254* 201*   Hyperlipidemia cont Crestor GERD cont PPI Urine retention cont Flomax, and has indwelling catheter in place Hypertension holding home meds in the setting of sepsis.  Gout cont allopurinol. Anxiety/depression/RLS:continue patient's Ativan, Cymbalta. AAA: CT 06/2021: Stable aortic endograft with prior EVAR- stable excluded aneurysm sac  size of approximately 3.3 x 4.2 cm without evidence of endoleak on delayed images. COPD: Continue bronchodilators as needed.  Paroxysmal A-fib HX: Rate controlled.  Not on anticoagulation due to severe thrombocytopenia.  He will need outpatient follow-up with cardiology  DVT prophylaxis: SCDs Start: 11/05/21 2355.  No chemical prophylaxis due to thrombocytopenia Code Status:   Code Status: Full Code Family Communication: plan of care discussed with patient at bedside. Patient status is: Inpatient due to ongoing IV antibiotics and febrile neutropenia   Level of care: Progressive  Dispo: The patient is from: HOME            Anticipated disposition: home>3 days  Mobility Assessment (last 72 hours)     Mobility Assessment     Row Name 11/06/21 1700           Does patient have an order for bedrest or is patient medically unstable No - Continue assessment       What is the highest level of mobility based on the progressive mobility assessment? Level 4 (Walks with assist in room) - Balance while marching in place and cannot step forward and back - Complete                 Objective: Vitals last 24 hrs: Vitals:   11/07/21 0300 11/07/21 0400 11/07/21 0500 11/07/21 0600  BP: (!) 152/97 (!) 153/99 (!) 153/99 (!) 144/95  Pulse: (!) 101 (!) 106 (!) 111 (!) 108  Resp: 20 (!) 23 20 (!) 34  Temp:   98.4 F (36.9 C)   TempSrc:   Oral   SpO2: 96% 96% 97% 97%  Weight:      Height:       Weight change:   Physical Examination: General exam: AA oriented, pleasant, on room air, older than stated age, weak appearing. HEENT:Oral mucosa moist, Ear/Nose WNL grossly, dentition normal. Respiratory system: bilaterally diminished, no use of accessory muscle Cardiovascular system: S1 & S2 +, No JVD,. Gastrointestinal system: Abdomen soft,NT,ND,BS+ Nervous System:Alert, awake, moving extremities and grossly nonfocal Extremities: LE ankle edema neg- left elbow with dressing in place minimal  erythema mildly tender, distal peripheral pulses palpable.  Skin: No rashes,no icterus. MSK: Normal muscle bulk,tone, power   Medications reviewed: Scheduled Meds:  calcium carbonate  500 mg of elemental calcium Oral Q breakfast   Chlorhexidine Gluconate Cloth  6 each Topical Q0600   DULoxetine  60 mg Oral QHS   insulin aspart  0-15 Units Subcutaneous TID WC   insulin aspart  0-5 Units Subcutaneous QHS   insulin glargine-yfgn  15 Units Subcutaneous Daily   LORazepam  0.5 mg Oral Q8H   pantoprazole  40 mg Oral Daily   rosuvastatin  10 mg Oral Daily   sodium chloride flush  3 mL Intravenous Q12H   tamsulosin  0.4 mg Oral QPM   valACYclovir  1,000 mg Oral Daily   Continuous Infusions:  lactated ringers 125 mL/hr at 11/06/21 1423   meropenem (MERREM) IV 1 g (11/07/21 0104)   vancomycin        Diet Order             Diet heart healthy/carb modified Room service appropriate? Yes; Fluid consistency: Thin  Diet effective now  Intake/Output Summary (Last 24 hours) at 11/07/2021 0706 Last data filed at 11/07/2021 0500 Gross per 24 hour  Intake 220 ml  Output 2650 ml  Net -2430 ml    Net IO Since Admission: 1,320 mL [11/07/21 0706]  Wt Readings from Last 3 Encounters:  11/05/21 72.6 kg  10/28/21 73.2 kg  10/07/21 75.5 kg     Unresulted Labs (From admission, onward)     Start     Ordered   11/07/21 0500  CBC with Differential/Platelet  Daily,   R      11/06/21 0918   11/07/21 0500  Comprehensive metabolic panel  Daily,   R      11/06/21 0918   11/06/21 2039  MRSA Next Gen by PCR, Nasal  Once,   R        11/06/21 2038   11/06/21 0624  Pathologist smear review  Once,   R        11/06/21 0623   11/05/21 2119  Urine Culture  Once,   URGENT       Question:  Indication  Answer:  Dysuria   11/05/21 2118          Data Reviewed: I have personally reviewed following labs and imaging studies CBC: Recent Labs  Lab 11/04/21 0900  11/05/21 2045 11/06/21 0500 11/07/21 0434  WBC 1.2* 0.2* 0.2* 0.5*  NEUTROABS 1.0* 0.0*  --  0.3*  HGB 11.2* 10.8* 8.8* 8.6*  HCT 32.9* 31.6* 25.2* 24.5*  MCV 89.2 90.0 88.4 88.1  PLT 60* 37* 20* 17*    Basic Metabolic Panel: Recent Labs  Lab 11/04/21 0900 11/05/21 2045 11/06/21 0500 11/07/21 0434  NA 131* 132* 135 133*  K 4.8 4.8 3.9 3.9  CL 97* 100 105 101  CO2 26 21* 25 23  GLUCOSE 368* 259* 163* 161*  BUN '23 23 16 15  '$ CREATININE 0.75 0.79 0.64 0.50*  CALCIUM 9.1 8.8* 8.1* 8.0*    GFR: Estimated Creatinine Clearance: 92 mL/min (A) (by C-G formula based on SCr of 0.5 mg/dL (L)). Liver Function Tests: Recent Labs  Lab 11/04/21 0900 11/05/21 2045 11/06/21 0500 11/07/21 0434  AST 17 16 12* 16  ALT 38 '28 22 22  '$ ALKPHOS 65 58 44 46  BILITOT 0.7 1.1 0.7 0.9  PROT 6.2* 6.7 5.3* 5.9*  ALBUMIN 3.8 3.4* 2.6* 2.8*    No results for input(s): "LIPASE", "AMYLASE" in the last 168 hours. No results for input(s): "AMMONIA" in the last 168 hours. Coagulation Profile: Recent Labs  Lab 11/05/21 2045  INR 1.0    BNP (last 3 results) No results for input(s): "PROBNP" in the last 8760 hours. HbA1C: No results for input(s): "HGBA1C" in the last 72 hours. CBG: Recent Labs  Lab 11/06/21 0140 11/06/21 0807 11/06/21 1148 11/06/21 1643 11/06/21 2015  GLUCAP 205* 181* 221* 254* 201*    Lipid Profile: No results for input(s): "CHOL", "HDL", "LDLCALC", "TRIG", "CHOLHDL", "LDLDIRECT" in the last 72 hours. Thyroid Function Tests: No results for input(s): "TSH", "T4TOTAL", "FREET4", "T3FREE", "THYROIDAB" in the last 72 hours. Sepsis Labs: Recent Labs  Lab 11/05/21 0112 11/05/21 2045  LATICACIDVEN 1.1 2.0*     Recent Results (from the past 240 hour(s))  Culture, blood (Routine x 2)     Status: None (Preliminary result)   Collection Time: 11/05/21  8:45 PM   Specimen: BLOOD  Result Value Ref Range Status   Specimen Description   Final    BLOOD BLOOD RIGHT  HAND Performed at  [facility], [place] [address], [place], [place] [address]    Special Requests   Final    BOTTLES DRAWN AEROBIC AND ANAEROBIC Blood Culture results may not be optimal due to an inadequate volume of blood received in culture bottles Performed at [place] [place] [address] [place], [place] [address]    Culture   Final    NO GROWTH < 12 HOURS Performed at [facility] [address], [place], [place] [address]    Report Status PENDING  Incomplete  Culture, blood (Routine x 2)     Status: None (Preliminary result)   Collection Time: 11/05/21 10:39 PM   Specimen: BLOOD  Result Value Ref Range Status   Specimen Description   Final    BLOOD RIGHT PORTA CATH Performed at [facility] [address], [place], [place] [address]    Special Requests   Final    BOTTLES DRAWN AEROBIC AND ANAEROBIC Blood Culture adequate volume Performed at [facility] [address], [place], [place] [address]    Culture   Final    NO GROWTH < 12 HOURS Performed at [facility] [address] [place], [place] [address]    Report Status PENDING  Incomplete  Resp Panel by RT-PCR (Flu A&B, Covid) Anterior Nasal Swab     Status: Abnormal   Collection Time: 11/05/21 11:20 PM   Specimen: Anterior Nasal Swab  Result Value Ref Range Status   SARS Coronavirus 2 by RT PCR POSITIVE (A) NEGATIVE Final    Comment: (NOTE) SARS-CoV-2 target nucleic acids are DETECTED.  The SARS-CoV-2 RNA is generally detectable in upper respiratory specimens during the acute phase of infection. Positive results are indicative of the presence of the identified virus, but do not rule out bacterial infection or co-infection with other pathogens not detected by the test. Clinical correlation with patient history and other diagnostic information is necessary to determine patient infection status. The expected result is  Negative.  Fact Sheet for Patients: EntrepreneurPulse.com.au  Fact Sheet for Healthcare Providers: IncredibleEmployment.be  This test is not yet approved or cleared by the [place] FDA and  has been authorized for detection and/or diagnosis of SARS-CoV-2 by FDA under an Emergency Use Authorization (EUA).  This EUA will remain in effect (meaning this test can be used) for the duration of  the COVID-19 declaration under Section 564(b)(1) of the A ct, 21 U.S.C. section 360bbb-3(b)(1), unless the authorization is terminated or revoked sooner.     Influenza A by PCR NEGATIVE NEGATIVE Final   Influenza B by PCR NEGATIVE NEGATIVE Final    Comment: (NOTE) The Xpert Xpress SARS-CoV-2/FLU/RSV plus assay is intended as an aid in the diagnosis of influenza from Nasopharyngeal swab specimens and should not be used as a sole basis for treatment. Nasal washings and aspirates are unacceptable for Xpert Xpress SARS-CoV-2/FLU/RSV testing.  Fact Sheet for Patients: EntrepreneurPulse.com.au  Fact Sheet for Healthcare Providers: IncredibleEmployment.be  This test is not yet approved or cleared by the [place] FDA and has been authorized for detection and/or diagnosis of SARS-CoV-2 by FDA under an Emergency Use Authorization (EUA). This EUA will remain in effect (meaning this test can be used) for the duration of the COVID-19 declaration under Section 564(b)(1) of the Act, 21 U.S.C. section 360bbb-3(b)(1), unless the authorization is terminated or revoked.  Performed at [facility], [place] [address] [place],  [address]     Antimicrobials:  Anti-infectives (From admission, onward)    Start     Dose/Rate Route Frequency Ordered Stop   11/07/21 1200  vancomycin (VANCOCIN) IVPB 1000 mg/200 mL premix        1,000 mg 200 mL/hr over 60 Minutes Intravenous Every 12 hours 11/06/21 0015      11/06/21 1000  valACYclovir (VALTREX) tablet 1,000 mg        1,000 mg Oral Daily 11/05/21 2356     11/06/21 1000  meropenem (MERREM) 1 g in sodium chloride 0.9 % 100 mL IVPB        1 g 200 mL/hr over 30 Minutes Intravenous Every 8 hours 11/06/21 0015     11/06/21 0030  vancomycin (VANCOREADY) IVPB 1500 mg/300 mL        1,500 mg 150 mL/hr over 120 Minutes Intravenous  Once 11/05/21 2333 11/06/21 0304   11/05/21 2345  meropenem (MERREM) 1 g in sodium chloride 0.9 % 100 mL IVPB        1 g 200 mL/hr over 30 Minutes Intravenous  Once 11/05/21 2333 11/06/21 0304   11/05/21 2245  ceFEPIme (MAXIPIME) 2 g in sodium chloride 0.9 % 100 mL IVPB        2 g 200 mL/hr over 30 Minutes Intravenous  Once 11/05/21 2234 11/06/21 0028      Culture/Microbiology    Component Value Date/Time   SDES  11/05/2021 2239    BLOOD RIGHT PORTA CATH Performed at [facility], [place] [address], [place], [place] [address]    SPECREQUEST  11/05/2021 2239    BOTTLES DRAWN AEROBIC AND ANAEROBIC Blood Culture adequate volume Performed at [facility] [address] [place], [place] [address]    CULT  11/05/2021 2239    NO GROWTH < 12 HOURS Performed at [facility] [address] [place], [place] [address]    REPTSTATUS PENDING 11/05/2021 2239    Radiology Studies: CT EBLOW LEFT WWO CONTRAST  Result Date: 11/06/2021 CLINICAL DATA:  Left elbow swelling. Bone lesion, elbow, incidental. Febrile neutropenia. History of lymphoma. Evaluate for abscess or fluid. EXAM: CT OF THE UPPER LEFT EXTREMITY WITHOUT AND WITH CONTRAST TECHNIQUE: Multidetector CT imaging of the left elbow was performed following the standard protocol before and during bolus administration of intravenous contrast. RADIATION DOSE REDUCTION: This exam was performed according to the departmental dose-optimization program which includes automated exposure control, adjustment of the mA and/or kV according to  patient size and/or use of iterative reconstruction technique. COMPARISON:  No radiographs are available.  PET-CT 07/26/2021. CONTRAST:  159m OMNIPAQUE IOHEXOL 300 MG/ML  SOLN FINDINGS: Bones/Joint/Cartilage No evidence of acute fracture, dislocation or bone destruction. Mild spurring of the olecranon and coronoid processes. No erosive changes are identified. There is a moderate size joint effusion without apparent synovial enhancement. Previous T12 spinal augmentation and multilevel spondylosis are noted. Ligaments Suboptimally assessed by CT. Muscles and Tendons No intramuscular fluid collection or abnormal enhancement identified. The biceps and triceps tendons appear intact. Soft tissues Moderate dorsal subcutaneous edema without focal fluid collection or abnormal enhancement. No foreign body or soft tissue emphysema noted in this area. There is a small amount of anterior subcutaneous emphysema which appears intravascular, likely related to an IV catheter. IMPRESSION: 1. Non focal subcutaneous edema posteriorly, suggesting soft tissue infection (cellulitis) with possible olecranon bursitis. 2. Nonspecific moderate-sized elbow joint effusion. Cannot exclude septic arthritis. 3. No evidence of soft tissue abscess, osteomyelitis or acute osseous abnormality. Electronically Signed  By: Richardean Sale M.D.   On: 11/06/2021 12:14   DG Chest 2 View  Result Date: 11/05/2021 CLINICAL DATA:  Generalized weakness EXAM: CHEST - 2 VIEW COMPARISON:  08/13/2021 FINDINGS: Cardiac shadow is stable. Right chest wall port is stable. Linear scarring in the left base is again noted. No sizable effusion is seen. Changes of prior vertebral augmentation are noted. IMPRESSION: Left basilar scarring. Electronically Signed   By: Inez Catalina M.D.   On: 11/05/2021 20:55     LOS: 1 day   Antonieta Pert, MD [facility]  11/07/2021, 7:06 AM

## 2021-11-07 NOTE — Progress Notes (Signed)
[facility] Infectious Diseases Follow Up Note  Patient Identification: Patient Name: Aaron KUMPF Sr. 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 WBC 0.5, ANC 0.3.    Assessment Fevers: Left elbow cellulitis vs neutropenic fevers. CT concerning for cellulitis of left elbow vs olecranon bursitis, non specific moderate sized elbow joint effusion. Cannot exclude septic arthritis. Attempted aspiration by Ortho 7/2 with dry tap and low suspicion for septic arthritis or olecranon bursitis.No GU/GI symptoms. No symptoms at port. Less likely re-infection with COVID given CT value 32.6 and no respiratory symptoms, no acute abnormality in CXR   Diffuse large cell non-Hodgkin's lymphoma ( Extranodal disease) s/p R-CHOP with last cycle 10/28/21 Chronic Foley in the setting of urinary retention Type 2 DM Pancytopenia - improving  H/o MRSA bacteremia/Port-a-cath and axillary infection in January 2023    Recommendations Continue Vancomycin, pharmacy to dose and meropenem as is given left elbow cellulitis and neutropenic fevers for the time being  Repeat CBC w diff daily, some  improvement in [facility] noted  Fu blood cultures  D/w IP regarding COVID positivity with CT value 32.6, agrees less likely new infection. DC air borne precautions   Dr Gale Journey covering tomorrow  Rest of the management as per the primary team. Thank you for the consult. Please page with pertinent questions or concerns.  ______________________________________________________________________ Subjective patient seen and examined at the bedside.  Left elbow pain getting better Denies cough, chest pain and SOB Denies GU symptoms, rashes, swelling, dysphagia, oral sores, sinus pain. Perirectal pain  Vitals BP (!) 142/94   Pulse (!) 109   Temp 98.6 F (37 C)   Resp (!) 25   Ht '5\' 11"'$  (1.803 m)   Wt 72.6 kg   SpO2 95%   BMI 22.32 kg/m     Physical Exam Constitutional:  sitting up in the bed. Comfortable    Comments: no mucositis   Cardiovascular:     Rate and Rhythm: Normal rate and regular rhythm.     Heart sounds  Pulmonary:     Effort: Pulmonary effort is normal.     Comments: Normal breath sounds   Abdominal:     Palpations: Abdomen is soft.     Tenderness: non distended and non tender, chronic foley's  Musculoskeletal:        General: Left elbow with mild redness and swelling, limited ROM due to pain. No swelling and tenderness in other peripheral joints   Skin:    Comments: No obvious rashes   Neurological:     General: awake, alert and oriented. Grossly non focal   Psychiatric:        Mood and Affect: Mood normal.   Pertinent Microbiology Results for orders placed or performed during the  hospital encounter of 11/05/21  Culture, blood (Routine x 2)     Status: None (Preliminary result)   Collection Time: 11/05/21  8:45 PM   Specimen: BLOOD  Result Value Ref Range Status   Specimen Description   Final    BLOOD BLOOD RIGHT HAND Performed at [facility] [address] [place], [place] [address]    Special Requests   Final    BOTTLES DRAWN  AEROBIC AND ANAEROBIC Blood Culture results may not be optimal due to an inadequate volume of blood received in culture bottles Performed at [facility] [address] [place], [place] [address]    Culture   Final    NO GROWTH 1 DAY Performed at [facility], [place] [address] [place], [place] [address]    Report Status PENDING  Incomplete  Urine Culture     Status: Abnormal (Preliminary result)   Collection Time: 11/05/21  9:20 PM   Specimen: Urine, Catheterized  Result Value Ref Range Status   Specimen Description   Final    URINE, CATHETERIZED Performed at [facility] [address] [place], [place] [address]    Special Requests   Final    NONE Performed at [facility], [place] [address], [place], [place] [address]    Culture (A)  Final    >=100,000 COLONIES/mL KLEBSIELLA ORNITHINOLYTICA CULTURE REINCUBATED FOR BETTER GROWTH SUSCEPTIBILITIES TO FOLLOW Performed at [facility], [place] [address] [place], [place] [address]    Report Status PENDING  Incomplete  Culture, blood (Routine x 2)     Status: None (Preliminary result)   Collection Time: 11/05/21 10:39 PM   Specimen: BLOOD  Result Value Ref Range Status   Specimen Description   Final    BLOOD RIGHT PORTA CATH Performed at [facility] [address], [place], [place] [address]    Special Requests   Final    BOTTLES DRAWN AEROBIC AND ANAEROBIC Blood Culture adequate volume Performed at [facility] [address] [place], [place] [address]    Culture   Final    NO GROWTH 1 DAY Performed at [facility], [place] [address] [place], [place] [address]    Report Status PENDING  Incomplete  Resp Panel by RT-PCR (Flu A&B, Covid) Anterior Nasal Swab     Status: Abnormal   Collection Time: 11/05/21 11:20 PM   Specimen: Anterior Nasal Swab  Result Value Ref Range Status   SARS Coronavirus 2 by RT PCR POSITIVE  (A) NEGATIVE Final    Comment: (NOTE) SARS-CoV-2 target nucleic acids are DETECTED.  The SARS-CoV-2 RNA is generally detectable in upper respiratory specimens during the acute phase of infection. Positive results are indicative of the presence of the identified virus, but do not rule out bacterial infection or co-infection with other pathogens not detected by the test. Clinical correlation with patient history and other diagnostic information is necessary to determine patient infection status. The expected result is Negative.  Fact Sheet for Patients: EntrepreneurPulse.com.au  Fact Sheet for Healthcare Providers: IncredibleEmployment.be  This test is not yet approved or cleared by the [place] FDA and  has been authorized for detection and/or diagnosis of SARS-CoV-2 by FDA under an Emergency Use Authorization (EUA).  This EUA will remain in effect (meaning this test can be used) for the duration of  the COVID-19 declaration under Section 564(b)(1) of the A ct, 21 U.S.C. section 360bbb-3(b)(1), unless  the authorization is terminated or revoked sooner.     Influenza A by PCR NEGATIVE NEGATIVE Final   Influenza B by PCR NEGATIVE NEGATIVE Final    Comment: (NOTE) The Xpert Xpress SARS-CoV-2/FLU/RSV plus assay is intended as an aid in the diagnosis of influenza from Nasopharyngeal swab specimens and should not be used as a sole basis for treatment. Nasal washings and aspirates are unacceptable for Xpert Xpress SARS-CoV-2/FLU/RSV testing.  Fact Sheet for Patients: EntrepreneurPulse.com.au  Fact Sheet for Healthcare Providers: IncredibleEmployment.be  This test is not yet approved or cleared by the [place] FDA and has been authorized for detection and/or diagnosis of SARS-CoV-2 by FDA under an Emergency Use Authorization (EUA). This EUA will remain in effect (meaning this test can be used) for the  duration of the COVID-19 declaration under Section 564(b)(1) of the Act, 21 U.S.C. section 360bbb-3(b)(1), unless the authorization is terminated or revoked.  Performed at [facility], [place] [address], [place], [place] [address]   MRSA Next Gen by PCR, Nasal     Status: None   Collection Time: 11/07/21 11:13 AM   Specimen: Nasal Mucosa; Nasal Swab  Result Value Ref Range Status   MRSA by PCR Next Gen NOT DETECTED NOT DETECTED Final    Comment: (NOTE) The GeneXpert MRSA Assay (FDA approved for NASAL specimens only), is one component of a comprehensive MRSA colonization surveillance program. It is not intended to diagnose MRSA infection nor to guide or monitor treatment for MRSA infections. Test performance is not FDA approved in patients less than 69 years old. Performed at [facility], [place] [address] [place], [place] [address]     Pertinent Lab.    Latest Ref Rng & Units 11/07/2021    4:34 AM 11/06/2021    5:00 AM 11/05/2021    8:45 PM  CBC  WBC 4.0 - 10.5 K/uL 0.5  0.2  0.2   Hemoglobin 13.0 - 17.0 g/dL 8.6  8.8  10.8   Hematocrit 39.0 - 52.0 % 24.5  25.2  31.6   Platelets 150 - 400 K/uL 17  20  37       Latest Ref Rng & Units 11/07/2021    4:34 AM 11/06/2021    5:00 AM 11/05/2021    8:45 PM  CMP  Glucose 70 - 99 mg/dL 161  163  259   BUN 8 - 23 mg/dL '15  16  23   '$ Creatinine 0.61 - 1.24 mg/dL 0.50  0.64  0.79   Sodium 135 - 145 mmol/L 133  135  132   Potassium 3.5 - 5.1 mmol/L 3.9  3.9  4.8   Chloride 98 - 111 mmol/L 101  105  100   CO2 22 - 32 mmol/L '23  25  21   '$ Calcium 8.9 - 10.3 mg/dL 8.0  8.1  8.8   Total Protein 6.5 - 8.1 g/dL 5.9  5.3  6.7   Total Bilirubin 0.3 - 1.2 mg/dL 0.9  0.7  1.1   Alkaline Phos 38 - 126 U/L 46  44  58   AST 15 - 41 U/L '16  12  16   '$ ALT 0 - 44 U/L '22  22  28      '$ Pertinent Imaging today Plain films and CT images have been personally visualized and interpreted; radiology reports have been reviewed.  Decision making incorporated into the Impression / Recommendations.  CT EBLOW LEFT WWO CONTRAST  Result Date: 11/06/2021 CLINICAL DATA:  Left elbow swelling. Bone lesion, elbow, incidental. Febrile neutropenia. History of lymphoma. Evaluate for abscess or fluid. EXAM: CT OF THE UPPER LEFT EXTREMITY WITHOUT AND WITH CONTRAST TECHNIQUE: Multidetector CT imaging of the left elbow was performed following the standard protocol before and during bolus administration of intravenous contrast. RADIATION DOSE REDUCTION: This exam was performed according to the departmental dose-optimization program which includes automated exposure control, adjustment of the mA and/or kV according to patient size and/or use of iterative reconstruction technique. COMPARISON:  No radiographs are available.  PET-CT 07/26/2021. CONTRAST:  117m OMNIPAQUE IOHEXOL 300 MG/ML  SOLN FINDINGS: Bones/Joint/Cartilage No evidence of acute fracture, dislocation or bone destruction. Mild spurring of the olecranon and coronoid processes. No erosive changes are identified. There is a moderate size joint effusion without apparent synovial enhancement. Previous T12 spinal augmentation and multilevel spondylosis are noted. Ligaments Suboptimally assessed by CT. Muscles and Tendons No intramuscular fluid collection or abnormal enhancement identified. The biceps and triceps tendons appear intact. Soft tissues Moderate dorsal subcutaneous edema without focal fluid collection or abnormal enhancement. No foreign body or soft tissue emphysema noted in this area. There is a small amount of anterior subcutaneous emphysema which appears intravascular, likely related to an IV catheter. IMPRESSION: 1. Non focal subcutaneous edema posteriorly, suggesting soft tissue infection (cellulitis) with possible olecranon bursitis. 2. Nonspecific moderate-sized elbow joint effusion. Cannot exclude septic arthritis. 3. No evidence of soft tissue abscess, osteomyelitis or acute  osseous abnormality. Electronically Signed   By: WRichardean SaleM.D.   On: 11/06/2021 12:14   DG Chest 2 View  Result Date: 11/05/2021 CLINICAL DATA:  Generalized weakness EXAM: CHEST - 2 VIEW COMPARISON:  08/13/2021 FINDINGS: Cardiac shadow is stable. Right chest wall port is stable. Linear scarring in the left base is again noted. No sizable effusion is seen. Changes of prior vertebral augmentation are noted. IMPRESSION: Left basilar scarring. Electronically Signed   By: MInez CatalinaM.D.   On: 11/05/2021 20:55     I spent 55 minutes for this patient encounter including review of prior medical records, coordination of care with primary/other specialist with greater than 50% of time being face to face/counseling and discussing diagnostics/treatment plan with the patient/family.  Electronically signed by:   SRosiland Oz MD Infectious Disease Physician [facility] Pager: [phone]

## 2021-11-07 NOTE — Progress Notes (Signed)
Initial Nutrition Assessment  DOCUMENTATION CODES:   Not applicable  INTERVENTION:  Liberalize diet from a heart healthy/carb modified to a carb modified diet to provide widest variety of menu options to enhance nutritional adequacy Ensure Enlive po BID, each supplement provides 350 kcal and 20 grams of protein. MVI with minerals daily  NUTRITION DIAGNOSIS:   Increased nutrient needs related to acute illness, cancer and cancer related treatments as evidenced by estimated needs.  GOAL:   Patient will meet greater than or equal to 90% of their needs  MONITOR:   PO intake, Supplement acceptance, Diet advancement, Labs, Weight trends  REASON FOR ASSESSMENT:   Malnutrition Screening Tool    ASSESSMENT:   Pt admitted from home with weakness, body aches and fever, found to have febrile neutropenia secondary to chemotherapy. Pt also tested positive for COVID-19 upon admission. PMH significant for advanced large cell non-Hodgkin's lymphoma, pancytopenia, diabetes, HLD, RLS, anxiety, depression, HTN, COPD, afib, urinary retention and AAA.    CT scan findings of cellulitis of L elbow. Ortho attempted aspiration of fluid, however determined to be low suspicious for septic arthritis or septic olecranon bursitis and more likely d/t cellulitis. Plans to continue with antibiotic treatment.  Spoke with pt vis phone call to room. He reports feeling better today. He states that since beginning chemo in January, he has had a poor appetite. Despite this, he continues to try to eat 3 meals per day as able. He may have snacks between if he is up to it. He reports drinking 2 Ensure daily, morning and night. He denies nausea and vomiting and difficulty chewing/swallowing.   Pt reports that he checks his blood sugar 4x per day and uses insulin to manage his diabetes. He reports taking prednisone while on chemotherapy which has increased his blood sugars, otherwise he endorses good blood sugar control when  not on steroids.   Meal completions: 07/02: 75%- dinner  Pt reports significant wt loss. Endorses a wt loss of 30 lbs since starting chemo in January. However he states that his current wt is remaining steady at 160 lbs. Reviewed wt history. Between 01/06-07/01, it appears he has had a 19.5% wt loss which is clinically significant for time frame.   Pt likely has a degree of malnutrition given decrease in appetite and significant wt loss, however unable to confirm at this time. Will reassess at follow up once able to obtain nutrition focused physical exam.   Medications: calcium carbonate, SSI 0-15 units TID, SSI 0-5 units QHS, semglee 25 units daily, protonix  IV drips: LR @ 84m/hr, abx  Labs: sodium 133, Cr 0.50, HgbA1c 9/1% (01/06), CBG's 156-254 x24 hours  NUTRITION - FOCUSED PHYSICAL EXAM: RD working remotely. Deferred to follow up.   Diet Order:   Diet Order             Diet Carb Modified Fluid consistency: Thin; Room service appropriate? Yes  Diet effective now                   EDUCATION NEEDS:   No education needs have been identified at this time  Skin:  Skin Assessment: Reviewed RN Assessment  Last BM:  7/1  Height:   Ht Readings from Last 1 Encounters:  11/05/21 '5\' 11"'$  (1.803 m)    Weight:   Wt Readings from Last 1 Encounters:  11/05/21 72.6 kg   BMI:  Body mass index is 22.32 kg/m.  Estimated Nutritional Needs:   Kcal:  2200-2400  Protein:  110-125g  Fluid:  >/=2.2L  Clayborne Dana, RDN, LDN Clinical Nutrition

## 2021-11-07 NOTE — Progress Notes (Signed)
He is still having some temperatures.  Had temperature spike of 102.5 last night.  His white cell count is 0.5 with a neutrophil count of 300.  His monocytes are still up.  Again, I suspect his white cell count will continue to improve.  His platelet count is 17,000.    He did have the CT of the left elbow yesterday.  This did not show any abscess.  He has some fluid which probably was more like a cellulitis.  I appreciate Orthopedic Surgery seeing him.  They tried to aspirate from the elbow but cannot get any fluid out.  He says the elbow does feel a little bit better.  He is on broad-spectrum antibiotics.  So far, cultures have not come back.  He has had no nausea or vomiting.  He has had no bleeding.  He has had no diarrhea.  His electrolytes show BUN of 15 creatinine 0.5.  His blood sugar was 161.  Calcium is 8.0 with an albumin of 2.8.  His vital signs show temperature of 98.4.  Pulse 108.  Blood pressure 144/95.  Oral exam does not show any mucositis.  Lungs are clear.  Cardiac exam slightly tachycardic but regular.  Abdomen is soft.  Bowel sounds are present.  He has no fluid wave.  There is no guarding or rebound tenderness.  Extremities does show the swollen left elbow.  There are some erythema associated with this.  He has decreased range of motion.  Had to believe that the fever could certainly be from this left elbow.  Again he is on broad-spectrum coverage.    I suspect that he probably will be in the hospital for another 3 to 4 days.  I appreciate the great care that he is getting from the wonderful staff on [address].  Lattie Haw, MD  Oswaldo Milian 41:10

## 2021-11-08 DIAGNOSIS — D709 Neutropenia, unspecified: Secondary | ICD-10-CM | POA: Diagnosis not present

## 2021-11-08 DIAGNOSIS — R5081 Fever presenting with conditions classified elsewhere: Secondary | ICD-10-CM | POA: Diagnosis not present

## 2021-11-08 LAB — COMPREHENSIVE METABOLIC PANEL
ALT: 99 U/L — ABNORMAL HIGH (ref 0–44)
AST: 102 U/L — ABNORMAL HIGH (ref 15–41)
Albumin: 2.5 g/dL — ABNORMAL LOW (ref 3.5–5.0)
Alkaline Phosphatase: 58 U/L (ref 38–126)
Anion gap: 9 (ref 5–15)
BUN: 13 mg/dL (ref 8–23)
CO2: 25 mmol/L (ref 22–32)
Calcium: 8.4 mg/dL — ABNORMAL LOW (ref 8.9–10.3)
Chloride: 100 mmol/L (ref 98–111)
Creatinine, Ser: 0.57 mg/dL — ABNORMAL LOW (ref 0.61–1.24)
GFR, Estimated: >60 mL/min (ref >60)
Glucose, Bld: 212 mg/dL — ABNORMAL HIGH (ref 70–99)
Potassium: 3.7 mmol/L (ref 3.5–5.1)
Sodium: 134 mmol/L — ABNORMAL LOW (ref 135–145)
Total Bilirubin: 0.7 mg/dL (ref 0.3–1.2)
Total Protein: 5.7 g/dL — ABNORMAL LOW (ref 6.5–8.1)

## 2021-11-08 LAB — CBC WITH DIFFERENTIAL/PLATELET
Abs Immature Granulocytes: 0.1 10*3/uL — ABNORMAL HIGH (ref 0.00–0.07)
Basophils Absolute: 0 10*3/uL (ref 0.0–0.1)
Basophils Relative: 1 %
Eosinophils Absolute: 0 10*3/uL (ref 0.0–0.5)
Eosinophils Relative: 0 %
HCT: 22 % — ABNORMAL LOW (ref 39.0–52.0)
Hemoglobin: 7.7 g/dL — ABNORMAL LOW (ref 13.0–17.0)
Immature Granulocytes: 9 %
Lymphocytes Relative: 8 %
Lymphs Abs: 0.1 10*3/uL — ABNORMAL LOW (ref 0.7–4.0)
MCH: 30.4 pg (ref 26.0–34.0)
MCHC: 35 g/dL (ref 30.0–36.0)
MCV: 87 fL (ref 80.0–100.0)
Monocytes Absolute: 0.1 10*3/uL (ref 0.1–1.0)
Monocytes Relative: 9 %
Neutro Abs: 0.8 10*3/uL — ABNORMAL LOW (ref 1.7–7.7)
Neutrophils Relative %: 73 %
Platelets: 23 10*3/uL — CL (ref 150–400)
RBC: 2.53 MIL/uL — ABNORMAL LOW (ref 4.22–5.81)
RDW: 15.8 % — ABNORMAL HIGH (ref 11.5–15.5)
WBC: 1.1 10*3/uL — CL (ref 4.0–10.5)
nRBC: 0 % (ref 0.0–0.2)

## 2021-11-08 LAB — GLUCOSE, CAPILLARY
Glucose-Capillary: 180 mg/dL — ABNORMAL HIGH (ref 70–99)
Glucose-Capillary: 218 mg/dL — ABNORMAL HIGH (ref 70–99)
Glucose-Capillary: 244 mg/dL — ABNORMAL HIGH (ref 70–99)
Glucose-Capillary: 217 mg/dL — ABNORMAL HIGH (ref 70–99)

## 2021-11-08 LAB — PREPARE RBC (CROSSMATCH)

## 2021-11-08 LAB — HEMOGLOBIN AND HEMATOCRIT, BLOOD
HCT: 32.3 % — ABNORMAL LOW (ref 39.0–52.0)
Hemoglobin: 11.6 g/dL — ABNORMAL LOW (ref 13.0–17.0)

## 2021-11-08 MED ORDER — ATENOLOL 50 MG PO TABS
50.0000 mg | ORAL_TABLET | Freq: Every day | ORAL | Status: DC
  Administered 2021-11-08 – 2021-11-10 (×3): 50 mg via ORAL
  Filled 2021-11-08 (×3): qty 1

## 2021-11-08 MED ORDER — FUROSEMIDE 10 MG/ML IJ SOLN
20.0000 mg | Freq: Once | INTRAMUSCULAR | Status: AC
  Administered 2021-11-08: 20 mg via INTRAVENOUS
  Filled 2021-11-08: qty 2

## 2021-11-08 MED ORDER — SODIUM CHLORIDE 0.9% IV SOLUTION: Freq: Once | INTRAVENOUS | Status: AC

## 2021-11-08 MED ORDER — DOXYCYCLINE HYCLATE 100 MG PO TABS
100.0000 mg | ORAL_TABLET | Freq: Two times a day (BID) | ORAL | Status: DC
  Administered 2021-11-08 – 2021-11-10 (×4): 100 mg via ORAL
  Filled 2021-11-08 (×4): qty 1

## 2021-11-08 NOTE — Progress Notes (Signed)
Id brief note   Fever resolved since 7/2 7/1 bcx negative Wbc count improving 1.1 today and 73% neutrophil --> >500 neutrophils   Ct elbow reviewed   A/p Neutropenic fever Chemo for lymphoma (rchop 6/23) Elbow bursitis/cellulitis with joint effusion   -please run case through ortho about elbow joint effusion and bursitis signal to see if any drainage needed -given improving wbc and negative blood cx, I'll change abx as below Stop vanc Start doxy Continue meropenem -if bcx remains negative and continued to have sustained wbc/neutrophil improvement could switch meropenem to cefadroxil to go along with doxy to focus on elbow/skin process  Dr West Bali to resume care tomorrow

## 2021-11-08 NOTE — Progress Notes (Signed)
Aaron Weaver white cell count is coming up.  It is now 1.1.  The absolute neutrophil count is 800.  His hemoglobin is down down to 7.7.  I think he is going need to be transfused.  I think this would be helpful for him.  I really should not be surprised that he has another Klebsiella urinary tract infection.  He has his indwelling Foley.  He really needs to have Urology see him to see if this Foley can be taken out.  Maybe, while he is in the hospital, Urology can see him.  His left elbow is still a bit swollen.  He says it feels a little bit better.  He has some elevated liver function studies.  This might be from antibiotics.  His blood sugars continue to be on the high side.  He has had no fever.  His vital signs show temperature 98.5.  Pulse 110.  Blood pressure 152/98.  His lungs are clear.  Cardiac exam tachycardic but regular.  There are no murmurs.  Abdomen is soft.  Bowel sounds are present but slightly decreased.  Extremity shows no clubbing, cyanosis or edema.  He does have some swelling in the left elbow.  This does not appear to be as prominent.  Again, his white cell count is coming up nicely.  I will still keep him on the broad-spectrum antibiotics.  He has a Klebsiella in his urine.  Again, maybe Urology can see him to see about getting this Foley catheter.  We will transfuse him 2 units of blood today.  I know this will make him feel better.  I do appreciate the incredible care he is getting from all the staff on [address].  Lattie Haw, MD  Psalms 91:1-2

## 2021-11-08 NOTE — Progress Notes (Signed)
PROGRESS NOTE Aaron Weaver  GNF:[id number] DOB: 1956/04/23 DOA: 11/05/2021 PCP: [name], MD   Brief Narrative/Hospital Course: 66yom w/ significant history of lymphoma, pancytopenia, diabetes, hyperlipidemia, RLS, anxiety, depression, hypertension, COPD, atrial fibrillation, urinary retention, GERD, AAA presented 1 to 2 days of weakness body aches subjective fever, fairly similar to previous episodes of sepsis, also had redness at left elbow, has been on chronic Bactrim and Macrobid for UTI suppression with indwelling Foley, and famciclovir, last cycle of chemo was recently 5 days PTA and had 2 units of PRBC and pegfilgrastim. In the ED felt to be in sepsis with fever, neutropenia, tachycardia.  Labs with hyperglycemia, lactic acidosis, thrombocytopenia anemia.Urinalysis with bacteria but otherwise unable to interpret due to pigment/blood.  Urine culture and blood culture pending.  Chest x-ray with left basilar scarring. He also tested positive for COVID on admission and had tested +5 months ago and 4 months ago.  Blood cultures were sent, started on meropenem, valacyclovir, vancomycin.  He has history of resistant Klebsiella. He has some skin changes on the left elbow, questionable cellulitis.    Subjective: Seen and examined. Reports his elbow is hurting little bit but much better from before, he has no dysuria, suprapubic pain fever chills. Labs with improved WBC count, afebrile overnight. 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 Ortho tried but unable to obtain fluids from left elbow-low suspicion for septic arthritis or septic olecranon bursitis.  No pneumonia in imaging. ID, oncology on board. UA with bacteria but otherwise unable to interpret due to pigment/blood.Urine culture with Klebsiella blood culture no growth so far.  Continue Meropenem, valacyclovir, vancomycin as per ID   Chronic Foley in the setting of urine retention with Klebsiella ornithinolytica  in urine culture, similar organism in previous urine culture 09/19/21- ??  UTI versus colonization-suspect latter has no urinary symptoms or abdominal pain-Await ID inputs.  Followed by Dr. Diona Fanti last Foley changed 3 weeks ago and being changed every month-will advise outpatient follow-up with urology  COVID-positive has been positive in the past 4 months and 5 months ago,CT value 32.6, cut off is 44- so cont isolation for now as per ID.  No respiratory syndrome.    Pancytopenia in the setting of chemo use with thrombocytopenia anemia leukopenia:last cycle of chemo was recently 5 days PTA and had 2 units of PRBC and pegfilgrastim.  Oncology given 1-8 PRBC today.  Platelet low stable slightly up, ANC improving.  Recent Labs  Lab 11/06/21 0500 11/07/21 0434 11/08/21 0416  HGB 8.8* 8.6* 7.7*  HCT 25.2* 24.5* 22.0*  WBC 0.2* 0.5* 1.1*  PLT 20* 17* 23*   Lymphoma followed by Dr. Marin Olp who is on board, ER final dose of R-CHOP recently. Diabetes mellitus on long-term insulin poorly controlled, increaseD lantus to 25 units-keep on  SSI, monitor and adjust Recent Labs  Lab 11/07/21 0745 11/07/21 1212 11/07/21 1804 11/07/21 2050 11/08/21 0718  GLUCAP 156* 271* 241* 255* 180*    Hyperlipidemia on Crestor GERD cont PPI Urine retention cont Flomax, and has indwelling catheter in place-chronic  Hypertension BP stable 130s to 150s, uptrending, also tachycardic, will resume atenolol at 50 mg daily, at home on 100 mg. Paroxysmal A-fib HX: Rate uncontrolled resuming atenolol see hypertension.Not on anticoagulation due to severe thrombocytopenia.  He will need outpatient follow-up with cardiology   Gout cont allopurinol. Anxiety/depression/RLS: Stable, cont home ativan, Cymbalta. AAA: CT 06/2021: Stable aortic endograft with prior EVAR- stable excluded aneurysm sac size of approximately 3.3 x 4.2 cm without evidence of endoleak on delayed images. COPD: Continue bronchodilators as needed.    DVT prophylaxis: SCDs Start: 11/05/21 2355.  No chemical prophylaxis due to thrombocytopenia Code Status:   Code Status: Full Code Family Communication: plan of care discussed with patient at bedside. Patient status is: Inpatient due to ongoing IV antibiotics and febrile neutropenia   Level of care: Progressive  Dispo: The patient is from: HOME            Anticipated disposition: home>2 days  Mobility Assessment (last 72 hours)     Mobility Assessment     Row Name 11/07/21 2113 11/07/21 0800 11/06/21 1700       Does patient have an order for bedrest or is patient medically unstable No - Continue assessment No - Continue assessment No - Continue assessment     What is the highest level of mobility based on the progressive mobility assessment? Level 4 (Walks with assist in room) - Balance while marching in place and cannot step forward and back - Complete Level 4 (Walks with assist in room) - Balance while marching in place and cannot step forward and back - Complete Level 4 (Walks with assist in room) - Balance while marching in place and cannot step forward and back - Complete               Objective: Vitals last 24 hrs: Vitals:   11/07/21 1800 11/07/21 2040 11/08/21  0018 11/08/21 0400  BP: (!) 142/94 (!) 138/93 (!) 157/90 (!) 152/98  Pulse: (!) 109 (!) 110 (!) 113 (!) 110  Resp: (!) 25 (!) '22 19 19  '$ Temp: 98.6 F (37 C) 98.8 F (37.1 C) 98.5 F (36.9 C) 98.5 F (36.9 C)  TempSrc:  Oral Oral Oral  SpO2: 95% 97% 99% 96%  Weight:      Height:       Weight change:   Physical Examination: General exam: AA, older than stated age, weak appearing. HEENT:Oral mucosa moist, Ear/Nose WNL grossly, dentition normal. Respiratory system: bilaterally diminished, no use of accessory muscle Cardiovascular system: S1 & S2 +, No JVD,. Gastrointestinal system: Abdomen soft,NT,ND,BS+ Nervous System:Alert, awake, moving extremities and grossly nonfocal Extremities: LE ankle edema neg, left elbow mildly tender erythema resolved Skin: No rashes,no icterus. MSK: Normal muscle bulk,tone, power  Foley, port + Port+  Medications reviewed: Scheduled Meds:  sodium chloride   Intravenous Once   calcium carbonate  500 mg of elemental calcium Oral Q breakfast   Chlorhexidine Gluconate Cloth  6 each Topical Q0600   DULoxetine  60 mg Oral QHS   feeding supplement  237 mL Oral BID BM   furosemide  20 mg Intravenous Once   furosemide  20 mg Intravenous Once   insulin aspart  0-15 Units Subcutaneous TID WC  insulin aspart  0-5 Units Subcutaneous QHS   insulin glargine-yfgn  25 Units Subcutaneous Daily   LORazepam  0.5 mg Oral Q8H   multivitamin with minerals  1 tablet Oral Daily   pantoprazole  40 mg Oral Daily   rosuvastatin  10 mg Oral Daily   sodium chloride flush  3 mL Intravenous Q12H   tamsulosin  0.4 mg Oral QPM   valACYclovir  1,000 mg Oral Daily   Continuous Infusions:  lactated ringers 75 mL/hr at 11/07/21 2340   meropenem (MERREM) IV 1 g (11/08/21 0213)   vancomycin 1,000 mg (11/07/21 2343)      Diet Order             Diet Carb Modified Fluid consistency: Thin; Room service appropriate? Yes  Diet effective now                    Nutrition  Problem: Increased nutrient needs Etiology: acute illness, cancer and cancer related treatments Signs/Symptoms: estimated needs Interventions: Ensure Enlive (each supplement provides 350kcal and 20 grams of protein), MVI, Liberalize Diet   Intake/Output Summary (Last 24 hours) at 11/08/2021 0804 Last data filed at 11/08/2021 0612 Gross per 24 hour  Intake 2677.86 ml  Output 2450 ml  Net 227.86 ml    Net IO Since Admission: 1,547.86 mL [11/08/21 0804]  Wt Readings from Last 3 Encounters:  11/05/21 72.6 kg  10/28/21 73.2 kg  10/07/21 75.5 kg     Unresulted Labs (From admission, onward)     Start     Ordered   11/08/21 0617  Prepare RBC (crossmatch)  (Adult Blood Administration - Red Blood Cells)  Once,   R       Question Answer Comment  # of Units 2 units   Transfusion Indications Symptomatic Anemia   Number of Units to Keep Ahead NO units ahead   Instructions: Transfuse   If emergent release call blood bank Not emergent release      11/08/21 0616   11/08/21 0616  Type and screen [place]  Once,   R       Comments: [place]    11/08/21 0616   11/07/21 0500  CBC with Differential/Platelet  Daily,   R      11/06/21 0918   11/07/21 0500  Comprehensive metabolic panel  Daily,   R      11/06/21 0918          Data Reviewed: I have personally reviewed following labs and imaging studies CBC: Recent Labs  Lab 11/04/21 0900 11/05/21 2045 11/06/21 0500 11/07/21 0434 11/08/21 0416  WBC 1.2* 0.2* 0.2* 0.5* 1.1*  NEUTROABS 1.0* 0.0*  --  0.3* 0.8*  HGB 11.2* 10.8* 8.8* 8.6* 7.7*  HCT 32.9* 31.6* 25.2* 24.5* 22.0*  MCV 89.2 90.0 88.4 88.1 87.0  PLT 60* 37* 20* 17* 23*    Basic Metabolic Panel: Recent Labs  Lab 11/04/21 0900 11/05/21 2045 11/06/21 0500 11/07/21 0434 11/08/21 0416  NA 131* 132* 135 133* 134*  K 4.8 4.8 3.9 3.9 3.7  CL 97* 100 105 101 100  CO2 26 21* '25 23 25  '$ GLUCOSE 368* 259* 163* 161* 212*  BUN '23 23  16 15 13  '$ CREATININE 0.75 0.79 0.64 0.50* 0.57*  CALCIUM 9.1 8.8* 8.1* 8.0* 8.4*    GFR: Estimated Creatinine Clearance: 92 mL/min (A) (by C-G formula based on SCr of 0.57 mg/dL (L)). Liver Function Tests: Recent Labs  Lab  11/04/21 0900 11/05/21 2045 11/06/21 0500 11/07/21 0434 11/08/21 0416  AST 17 16 12* 16 102*  ALT 38 '28 22 22 '$ 99*  ALKPHOS 65 58 44 46 58  BILITOT 0.7 1.1 0.7 0.9 0.7  PROT 6.2* 6.7 5.3* 5.9* 5.7*  ALBUMIN 3.8 3.4* 2.6* 2.8* 2.5*    No results for input(s): "LIPASE", "AMYLASE" in the last 168 hours. No results for input(s): "AMMONIA" in the last 168 hours. Coagulation Profile: Recent Labs  Lab 11/05/21 2045  INR 1.0    BNP (last 3 results) No results for input(s): "PROBNP" in the last 8760 hours. HbA1C: No results for input(s): "HGBA1C" in the last 72 hours. CBG: Recent Labs  Lab 11/07/21 0745 11/07/21 1212 11/07/21 1804 11/07/21 2050 11/08/21 0718  GLUCAP 156* 271* 241* 255* 180*    Lipid Profile: No results for input(s): "CHOL", "HDL", "LDLCALC", "TRIG", "CHOLHDL", "LDLDIRECT" in the last 72 hours. Thyroid Function Tests: No results for input(s): "TSH", "T4TOTAL", "FREET4", "T3FREE", "THYROIDAB" in the last 72 hours. Sepsis Labs: Recent Labs  Lab 11/05/21 0112 11/05/21 2045  LATICACIDVEN 1.1 2.0*     Recent Results (from the past 240 hour(s))  Culture, blood (Routine x 2)     Status: None (Preliminary result)   Collection Time: 11/05/21  8:45 PM   Specimen: BLOOD  Result Value Ref Range Status   Specimen Description   Final    BLOOD BLOOD RIGHT HAND Performed at [facility] [address], [place], [place] [address]    Special Requests   Final    BOTTLES DRAWN AEROBIC AND ANAEROBIC Blood Culture results may not be optimal due to an inadequate volume of blood received in culture bottles Performed at [facility] [address], [place], [place] [address]    Culture   Final    NO  GROWTH 2 DAYS Performed at [facility] [address], [place], [place] [address]    Report Status PENDING  Incomplete  Urine Culture     Status: Abnormal (Preliminary result)   Collection Time: 11/05/21  9:20 PM   Specimen: Urine, Catheterized  Result Value Ref Range Status   Specimen Description   Final    URINE, CATHETERIZED Performed at [facility] [address], [place], [place] [address]    Special Requests   Final    NONE Performed at [facility], [place] [address], [place], [place] [address]    Culture (A)  Final    >=100,000 COLONIES/mL KLEBSIELLA ORNITHINOLYTICA CULTURE REINCUBATED FOR BETTER GROWTH SUSCEPTIBILITIES TO FOLLOW Performed at [facility], [place] [address] [place], [place] [address]    Report Status PENDING  Incomplete  Culture, blood (Routine x 2)     Status: None (Preliminary result)   Collection Time: 11/05/21 10:39 PM   Specimen: BLOOD  Result Value Ref Range Status   Specimen Description   Final    BLOOD RIGHT PORTA CATH Performed at [facility] [address] [place], [place] [address]    Special Requests   Final    BOTTLES DRAWN AEROBIC AND ANAEROBIC Blood Culture adequate volume Performed at [facility] [address], [place], [place] [address]    Culture   Final    NO GROWTH 2 DAYS Performed at [facility] [address] [place],  [address]    Report Status PENDING  Incomplete  Resp Panel by RT-PCR (Flu A&B, Covid) Anterior Nasal  Swab     Status: Abnormal   Collection Time: 11/05/21 11:20 PM   Specimen: Anterior Nasal Swab  Result Value Ref Range Status   SARS Coronavirus 2 by RT PCR POSITIVE (A) NEGATIVE Final    Comment: (NOTE) SARS-CoV-2 target nucleic acids are DETECTED.  The SARS-CoV-2 RNA is generally detectable in upper respiratory specimens during the acute phase of infection. Positive results are indicative of the  presence of the identified virus, but do not rule out bacterial infection or co-infection with other pathogens not detected by the test. Clinical correlation with patient history and other diagnostic information is necessary to determine patient infection status. The expected result is Negative.  Fact Sheet for Patients: EntrepreneurPulse.com.au  Fact Sheet for Healthcare Providers: IncredibleEmployment.be  This test is not yet approved or cleared by the [place] FDA and  has been authorized for detection and/or diagnosis of SARS-CoV-2 by FDA under an Emergency Use Authorization (EUA).  This EUA will remain in effect (meaning this test can be used) for the duration of  the COVID-19 declaration under Section 564(b)(1) of the A ct, 21 U.S.C. section 360bbb-3(b)(1), unless the authorization is terminated or revoked sooner.     Influenza A by PCR NEGATIVE NEGATIVE Final   Influenza B by PCR NEGATIVE NEGATIVE Final    Comment: (NOTE) The Xpert Xpress SARS-CoV-2/FLU/RSV plus assay is intended as an aid in the diagnosis of influenza from Nasopharyngeal swab specimens and should not be used as a sole basis for treatment. Nasal washings and aspirates are unacceptable for Xpert Xpress SARS-CoV-2/FLU/RSV testing.  Fact Sheet for Patients: EntrepreneurPulse.com.au  Fact Sheet for Healthcare Providers: IncredibleEmployment.be  This test is not yet approved or cleared by the [place] FDA and has been authorized for detection and/or diagnosis of SARS-CoV-2 by FDA under an Emergency Use Authorization (EUA). This EUA will remain in effect (meaning this test can be used) for the duration of the COVID-19 declaration under Section 564(b)(1) of the Act, 21 U.S.C. section 360bbb-3(b)(1), unless the authorization is terminated or revoked.  Performed at [facility], [place] [address], [place], [place] [address]   MRSA Next Gen by PCR, Nasal     Status: None   Collection Time: 11/07/21 11:13 AM   Specimen: Nasal Mucosa; Nasal Swab  Result Value Ref Range Status   MRSA by PCR Next Gen NOT DETECTED NOT DETECTED Final    Comment: (NOTE) The GeneXpert MRSA Assay (FDA approved for NASAL specimens only), is one component of a comprehensive MRSA colonization surveillance program. It is not intended to diagnose MRSA infection nor to guide or monitor treatment for MRSA infections. Test performance is not FDA approved in patients less than 11 years old. Performed at [facility], [place] [address], [place], [place] [address]     Antimicrobials: Anti-infectives (From admission, onward)    Start     Dose/Rate Route Frequency Ordered Stop   11/07/21 1200  vancomycin (VANCOCIN) IVPB 1000 mg/200 mL premix        1,000 mg 200 mL/hr over 60 Minutes Intravenous Every 12 hours 11/06/21 0015     11/06/21 1000  valACYclovir (VALTREX) tablet 1,000 mg        1,000 mg Oral Daily 11/05/21 2356     11/06/21 1000  meropenem (MERREM) 1 g in sodium chloride 0.9 % 100 mL IVPB        1 g 200 mL/hr over 30 Minutes Intravenous Every 8 hours 11/06/21 0015  11/06/21 0030  vancomycin (VANCOREADY) IVPB 1500 mg/300 mL        1,500 mg 150 mL/hr over 120 Minutes Intravenous  Once 11/05/21 2333 11/06/21 0304   11/05/21 2345  meropenem (MERREM) 1 g in sodium chloride 0.9 % 100 mL IVPB        1 g 200 mL/hr over 30 Minutes Intravenous  Once 11/05/21 2333 11/06/21 0304   11/05/21 2245  ceFEPIme (MAXIPIME) 2 g in sodium chloride 0.9 % 100 mL IVPB        2 g 200 mL/hr over 30 Minutes Intravenous  Once 11/05/21 2234 11/06/21 0028      Culture/Microbiology    Component Value Date/Time   SDES  11/05/2021 2239    BLOOD RIGHT PORTA CATH Performed at [facility], [place] [address], [place], [place] [address]    SPECREQUEST  11/05/2021 2239    BOTTLES DRAWN AEROBIC  AND ANAEROBIC Blood Culture adequate volume Performed at [facility] [address], [place], [place] [address]    CULT  11/05/2021 2239    NO GROWTH 2 DAYS Performed at [facility] [address], [place], [place] [address]    REPTSTATUS PENDING 11/05/2021 2239    Radiology Studies: CT EBLOW LEFT WWO CONTRAST  Result Date: 11/06/2021 CLINICAL DATA:  Left elbow swelling. Bone lesion, elbow, incidental. Febrile neutropenia. History of lymphoma. Evaluate for abscess or fluid. EXAM: CT OF THE UPPER LEFT EXTREMITY WITHOUT AND WITH CONTRAST TECHNIQUE: Multidetector CT imaging of the left elbow was performed following the standard protocol before and during bolus administration of intravenous contrast. RADIATION DOSE REDUCTION: This exam was performed according to the departmental dose-optimization program which includes automated exposure control, adjustment of the mA and/or kV according to patient size and/or use of iterative reconstruction technique. COMPARISON:  No radiographs are available.  PET-CT 07/26/2021. CONTRAST:  121m OMNIPAQUE IOHEXOL 300 MG/ML  SOLN FINDINGS: Bones/Joint/Cartilage No evidence of acute fracture, dislocation or bone destruction. Mild spurring of the olecranon and coronoid processes. No erosive changes are identified. There is a moderate size joint effusion without apparent synovial enhancement. Previous T12 spinal augmentation and multilevel spondylosis are noted. Ligaments Suboptimally assessed by CT. Muscles and Tendons No intramuscular fluid collection or abnormal enhancement identified. The biceps and triceps tendons appear intact. Soft tissues Moderate dorsal subcutaneous edema without focal fluid collection or abnormal enhancement. No foreign body or soft tissue emphysema noted in this area. There is a small amount of anterior subcutaneous emphysema which appears intravascular, likely related to an IV catheter. IMPRESSION: 1. Non focal  subcutaneous edema posteriorly, suggesting soft tissue infection (cellulitis) with possible olecranon bursitis. 2. Nonspecific moderate-sized elbow joint effusion. Cannot exclude septic arthritis. 3. No evidence of soft tissue abscess, osteomyelitis or acute osseous abnormality. Electronically Signed   By: WRichardean SaleM.D.   On: 11/06/2021 12:14     LOS: 2 days   RAntonieta Pert MD [facility]  11/08/2021, 8:04 AM

## 2021-11-09 ENCOUNTER — Inpatient Hospital Stay (HOSPITAL_COMMUNITY): Payer: Medicare Other

## 2021-11-09 ENCOUNTER — Encounter (HOSPITAL_COMMUNITY): Payer: Self-pay | Admitting: Radiology

## 2021-11-09 DIAGNOSIS — R5081 Fever presenting with conditions classified elsewhere: Secondary | ICD-10-CM | POA: Diagnosis not present

## 2021-11-09 DIAGNOSIS — D709 Neutropenia, unspecified: Secondary | ICD-10-CM | POA: Diagnosis not present

## 2021-11-09 DIAGNOSIS — L03114 Cellulitis of left upper limb: Secondary | ICD-10-CM

## 2021-11-09 LAB — CBC WITH DIFFERENTIAL/PLATELET
Abs Immature Granulocytes: 0.14 10*3/uL — ABNORMAL HIGH (ref 0.00–0.07)
Basophils Absolute: 0 10*3/uL (ref 0.0–0.1)
Basophils Relative: 1 %
Eosinophils Absolute: 0 10*3/uL (ref 0.0–0.5)
Eosinophils Relative: 1 %
HCT: 32.2 % — ABNORMAL LOW (ref 39.0–52.0)
Hemoglobin: 11.6 g/dL — ABNORMAL LOW (ref 13.0–17.0)
Immature Granulocytes: 6 %
Lymphocytes Relative: 8 %
Lymphs Abs: 0.2 10*3/uL — ABNORMAL LOW (ref 0.7–4.0)
MCH: 30.4 pg (ref 26.0–34.0)
MCHC: 36 g/dL (ref 30.0–36.0)
MCV: 84.5 fL (ref 80.0–100.0)
Monocytes Absolute: 0.2 10*3/uL (ref 0.1–1.0)
Monocytes Relative: 11 %
Neutro Abs: 1.6 10*3/uL — ABNORMAL LOW (ref 1.7–7.7)
Neutrophils Relative %: 73 %
Platelets: 36 10*3/uL — ABNORMAL LOW (ref 150–400)
RBC: 3.81 MIL/uL — ABNORMAL LOW (ref 4.22–5.81)
RDW: 15.1 % (ref 11.5–15.5)
WBC: 2.2 10*3/uL — ABNORMAL LOW (ref 4.0–10.5)
nRBC: 0 % (ref 0.0–0.2)

## 2021-11-09 LAB — COMPREHENSIVE METABOLIC PANEL
ALT: 151 U/L — ABNORMAL HIGH (ref 0–44)
AST: 102 U/L — ABNORMAL HIGH (ref 15–41)
Albumin: 2.9 g/dL — ABNORMAL LOW (ref 3.5–5.0)
Alkaline Phosphatase: 65 U/L (ref 38–126)
Anion gap: 8 (ref 5–15)
BUN: 12 mg/dL (ref 8–23)
CO2: 27 mmol/L (ref 22–32)
Calcium: 8.4 mg/dL — ABNORMAL LOW (ref 8.9–10.3)
Chloride: 98 mmol/L (ref 98–111)
Creatinine, Ser: 0.48 mg/dL — ABNORMAL LOW (ref 0.61–1.24)
GFR, Estimated: >60 mL/min (ref >60)
Glucose, Bld: 137 mg/dL — ABNORMAL HIGH (ref 70–99)
Potassium: 3.7 mmol/L (ref 3.5–5.1)
Sodium: 133 mmol/L — ABNORMAL LOW (ref 135–145)
Total Bilirubin: 1.1 mg/dL (ref 0.3–1.2)
Total Protein: 6.3 g/dL — ABNORMAL LOW (ref 6.5–8.1)

## 2021-11-09 LAB — GLUCOSE, CAPILLARY
Glucose-Capillary: 164 mg/dL — ABNORMAL HIGH (ref 70–99)
Glucose-Capillary: 203 mg/dL — ABNORMAL HIGH (ref 70–99)
Glucose-Capillary: 251 mg/dL — ABNORMAL HIGH (ref 70–99)
Glucose-Capillary: 241 mg/dL — ABNORMAL HIGH (ref 70–99)

## 2021-11-09 LAB — TYPE AND SCREEN
ABO/RH(D): B POS
Antibody Screen: NEGATIVE
Unit division: 0
Unit division: 0

## 2021-11-09 LAB — URINE CULTURE: Culture: >=100000 — AB

## 2021-11-09 LAB — C-REACTIVE PROTEIN: CRP: 12.8 mg/dL — ABNORMAL HIGH (ref <1.0)

## 2021-11-09 LAB — SEDIMENTATION RATE: Sed Rate: 89 mm/hr — ABNORMAL HIGH (ref 0–16)

## 2021-11-09 LAB — BPAM RBC
Blood Product Expiration Date: 202307112359
Blood Product Expiration Date: 202307132359
ISSUE DATE / TIME: 202307041008
ISSUE DATE / TIME: 202307041452
Unit Type and Rh: 1700
Unit Type and Rh: 7300

## 2021-11-09 MED ORDER — EPINEPHRINE 0.3 MG/0.3ML IJ SOAJ: 0.3000 mg | Freq: Once | INTRAMUSCULAR | Status: DC | PRN

## 2021-11-09 MED ORDER — GADOBUTROL 1 MMOL/ML IV SOLN
7.0000 mL | Freq: Once | INTRAVENOUS | Status: AC | PRN
  Administered 2021-11-09: 7 mL via INTRAVENOUS

## 2021-11-09 MED ORDER — AMOXICILLIN 250 MG PO CAPS
500.0000 mg | ORAL_CAPSULE | Freq: Once | ORAL | Status: AC
Start: 2021-11-09 — End: 2021-11-09
  Administered 2021-11-09: 500 mg via ORAL
  Filled 2021-11-09: qty 2

## 2021-11-09 MED ORDER — DIPHENHYDRAMINE HCL 50 MG/ML IJ SOLN
25.0000 mg | Freq: Once | INTRAMUSCULAR | Status: DC | PRN
  Filled 2021-11-09: qty 1

## 2021-11-09 MED ORDER — CEFAZOLIN SODIUM-DEXTROSE 1-4 GM/50ML-% IV SOLN
1.0000 g | Freq: Three times a day (TID) | INTRAVENOUS | Status: DC
  Administered 2021-11-09 – 2021-11-10 (×2): 1 g via INTRAVENOUS
  Filled 2021-11-09 (×3): qty 50

## 2021-11-09 NOTE — Progress Notes (Signed)
Pharmacy Antibiotic Note  ANDRAY ASSEFA Sr. is a 66 y.o. male admitted on 11/05/2021 with  FN with elbow cellulitis vs bursitis vs gout and ?UTI .  Pharmacy has been consulted for Merrem dosing.  ID: Febrile neutropenia; L elbow red ? Cellulitis/bursitis v gout. Elbow still swollen and red - On Bactrim PTA for UTI, has L red elbow. UTI vs colonization (chronic foley) - +COVID on admit but also + 4 and 5 months ago. - Afebrile,  WBC up to 2.2, improving (Chemo 6/23)   Antimicrobials this admission: 7/1 Cefepime x 1 7/1 Vancomycin>>7/4 7/1 Meropenem>> 7/4 Doxy>> Valtrex (famciclovir PTA) daily  Vancomycin 1500 mg IV q24 x 1 in ED followed by Vancomycin 1000 mg IV q12 for est AUC 457.6 Css min 12 using SCr 0.8, VD 0.72-  Microbiology results: 7/1 BCx2: ngtd 7/1 UCs: >=100,000 COLONIES/mL KLEBSIELLA ORNITHINOLYTICA  7/3 MRSA PCR: not detected  PTA:  09/20/10 UCx: > 100 K Klebsiella ornighinolytica: sens only to imipenem, nitrofurntion, pip/tazo  Plan: Con't Merrem 1g IV q8hr   Height: '5\' 11"'$  (180.3 cm) Weight: 72.6 kg (160 lb) IBW/kg (Calculated) : 75.3  Temp (24hrs), Avg:98.1 F (36.7 C), Min:97.4 F (36.3 C), Max:98.6 F (37 C)  Recent Labs  Lab 11/05/21 0112 11/05/21 2045 11/06/21 0500 11/07/21 0434 11/08/21 0416 11/09/21 0408  WBC  --  0.2* 0.2* 0.5* 1.1* 2.2*  CREATININE  --  0.79 0.64 0.50* 0.57* 0.48*  LATICACIDVEN 1.1 2.0*  --   --   --   --     Estimated Creatinine Clearance: 92 mL/min (A) (by C-G formula based on SCr of 0.48 mg/dL (L)).    Allergies  Allergen Reactions   Fexofenadine Other (See Comments)    "dries me out too much"   Penicillins Hives   Pravastatin Sodium Other (See Comments)    Aches   Requip [Ropinirole Hcl] Other (See Comments)    Bad dreams   Hydrochlorothiazide Other (See Comments)    Cramps    Maryagnes Carrasco S. Alford Highland, PharmD, BCPS Clinical Staff Pharmacist [email or web address]  Wayland [place] 11/09/2021 7:50 AM

## 2021-11-09 NOTE — Progress Notes (Signed)
Penicillin Allergy Clarification: Oral Amoxicillin Challenge  History of allergy: The patient describes the allergy as a childhood allergy of hives that occurred when he was 66 years old. Has never tried pencillins before. Noted tolerance of 3rd generation cephalosporins.   Type of intervention (select all that apply):  Amoxicillin Oral Challenge. Discussed with the patient and wife who are agreeable to proceed. Discussed plan and monitoring with the RN.  Impact on therapy: To be determined  Plan - Amoxicillin 500 mg po x 1 dose - Vitals to be checked every 15 minutes for 1 hour after the dose - Prn benadryl and epi-pen only if needed - D/c Meropenem and start Cefazolin later this evening (low risk of cross-sensitivity)  Thank you for allowing pharmacy to be a part of this patient's care.  Alycia Rossetti, PharmD, BCPS Infectious Diseases Clinical Pharmacist 11/09/2021 11:33 AM   **Pharmacist phone directory can now be found on [email or web address] ([email or web address]).  Listed under [place].

## 2021-11-09 NOTE — Progress Notes (Signed)
Pt completed ampicillin trial.  No complains of itching, no hives evident.  Pt vitals remained stable throughout trial. Will continue to monitor.

## 2021-11-09 NOTE — Care Management Important Message (Signed)
Important Message  Patient Details IM Letter given to the Patient. Name: Aaron Weaver Sr. MRN: [id number] Date of Birth: [birth year]   Medicare Important Message Given:  Yes     Kerin Salen 11/09/2021, 11:47 AM

## 2021-11-09 NOTE — Progress Notes (Signed)
Thankfully, the neutropenia is resolving.  Today, his white cell count is 2.2.  Hemoglobin 11.6.  Platelet count 36,000.  He did get transfused 2 units of blood yesterday.  The problem that we have is that his LFTs continue to rise.  His SGPT is 151.  SGOT 102.  There were normal 2 days ago.  Bilirubin is 1.1.  Alkaline phosphatase is 65.  His blood sugar is 137.  He has not had any temperature.  His left elbow still swollen and red.  He is on antibiotics for this.  He is on doxycycline and Merrem.  I do not know if the antibiotics could be causing the elevated liver function studies.  I have stopped the Crestor for right now.  He is having no abdominal pain.  He is eating.  Is having no diarrhea.  There is no bleeding.  He has had no headache.  There are no mouth sores.  His vital signs show temperature 97.5.  Pulse 113.  Blood pressure 142/95.  His lungs are clear.  He has good air movement bilaterally.  Cardiac exam is tachycardic but regular.  He has no murmurs, rubs or bruits.  Abdomen is soft.  He has no fluid wave.  There is no guarding rebound tenderness.  His left elbow still little swollen and red.  There are some tenderness.  He has some decreased range of motion.  The problems that we have to have right now is the elevated liver function studies.  I have to believe this is iatrogenic given the fact that his LFTs were normal 2 days ago.  Again, I do not know if this might be antibiotics doing this.  Thankfully, he is not neutropenic.  Hopefully, if the liver function studies improve, that he will be able to go home soon.  He would like to go home if possible.  Lattie Haw, MD  Psalms 41:1

## 2021-11-09 NOTE — Progress Notes (Signed)
PROGRESS NOTE Aaron Weaver  QVZ:[id number] DOB: 08-28-1955 DOA: 11/05/2021 PCP: [name], MD   Brief Narrative/Hospital Course: 66yom w/ significant history of lymphoma, pancytopenia, diabetes, hyperlipidemia, RLS, anxiety, depression, hypertension, COPD, atrial fibrillation, urinary retention, GERD, AAA presented 66 to 2 days of weakness body aches subjective fever, fairly similar to previous episodes of sepsis, also had redness at left elbow, has been on chronic Bactrim and Macrobid for UTI suppression with indwelling Foley, and famciclovir, last cycle of chemo was recently 5 days PTA and had 2 units of PRBC and pegfilgrastim. In the ED felt to be in sepsis with fever, neutropenia, tachycardia.  Labs with hyperglycemia, lactic acidosis, thrombocytopenia anemia.Urinalysis with bacteria but otherwise unable to interpret due to pigment/blood.  Urine culture and blood culture pending.  Chest x-ray with left basilar scarring. He also tested positive for COVID on admission and had tested +5 months ago and 4 months ago.  Blood cultures were sent, started on meropenem, valacyclovir, vancomycin.  He has history of resistant Klebsiella. He has some skin changes on the left elbow, questionable cellulitis.  Seen by orthopedics who attempted but unable to aspirate any fluid from the left elbow joint less concern for septic arthritis or bursitis.  Patient continued to improve clinically with IV antibiotics followed by infectious disease.  Received 2 units PRBC 7/4. Platelet WBC count neutrophils are nicely trending up. 7/5 Noticed to have abnormal LFTs trending up    Subjective: Seen and examined. Afebrile overnight. 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 Ortho tried but unable to obtain fluids from left elbow-low suspicion for septic arthritis or septic olecranon bursitis as per orthopedics.  Although pain/erythema is improving still has persistent swelling and limited ROM in left elbow-will ask Ortho to reeval- informed Dr Yetta Numbers who will see him today. Urine culture with Klebsiella blood culture no growth so far.  On meropenem> ID planning for amoxicillin challenge  Transaminitis noted LFTs are trending up: AST/ALT/ALP: Normal 7/2- now up as below.?  Antibiotic related, previous history of hepatic steatosis on the imaging, check ultrasound of liver.  Stopped his Crestor for now.   Recent Labs  Lab 11/05/21 2045 11/06/21 0500 11/07/21 0434 11/08/21 0416 11/09/21 0408  AST 16 12* 16 102* 102*  ALT '28 22 22 '$ 99* 151*  ALKPHOS 58 44 46 58 65  BILITOT 1.1 0.7 0.9 0.7 1.1  PROT 6.7 5.3* 5.9* 5.7* 6.3*  ALBUMIN 3.4* 2.6* 2.8* 2.5* 2.9*  INR 1.0  --   --   --   --      Chronic Foley in the setting of urine retention with Klebsiella ornithinolytica  in urine culture, similar organism in previous urine culture 09/19/21- ??  UTI versus colonization-suspect colonization-followed  by Dr. Diona Fanti last Foley changed 3 weeks ago and being changed every month-sent secrue caht message to Urology to see if it needs to be chanaged or any other recs. He  will need follow-up with urology. Dr Beatrix Fetters advised foley change.  COVID-positive has been positive in the past 4 months and 5 months ago,CT value 32.6-less likely infection, no respiratory symptoms chest x-ray unremarkable taken off isolation by ID.  Pancytopenia in the setting of chemo use with thrombocytopenia anemia leukopenia:last cycle of chemo was recently 5 days PTA and had 2 units of PRBC and pegfilgrastim, sp 2 units prbc again 7/4-hemoglobin improved along with WBC count and platelet count as below  Recent Labs  Lab 11/07/21 0434 11/08/21 0416 11/08/21 1951 11/09/21 0408  HGB 8.6* 7.7* 11.6* 11.6*  HCT 24.5* 22.0* 32.3* 32.2*  WBC 0.5* 1.1*  --  2.2*  PLT 17* 23*  --  36*   Lymphoma followed by Dr. Marin Olp who is on board, ER final dose of R-CHOP recently. Diabetes mellitus on long-term insulin poorly controlled, increaseD lantus to 25 7/3-blood sugar improving keep on  0-15 U SSI  and monitor. Recent Labs  Lab 11/08/21 0718 11/08/21 1130 11/08/21 1623 11/08/21 1944 11/09/21 0718  GLUCAP 180* 218* 244* 217* 164*   Hyperlipidemia holding Crestor GERD cont PPI  Hypertension BP has been trending up and tachycardic, resumed atenolol at 50 7/4-increase to home dose of 100. Paroxysmal A-fib HX: Rate uncontrolled -cont atenolol as above.Not on anticoagulation due to severe thrombocytopenia.  He will need outpatient follow-up with cardiology   Gout cont allopurinol. Anxiety/depression/RLS: Stable, cont home ativan, Cymbalta. AAA: CT 06/2021: Stable aortic endograft with prior EVAR- stable excluded aneurysm sac size of approximately 3.3 x 4.2 cm without evidence of endoleak on delayed images. COPD: Continue bronchodilators as needed.   DVT prophylaxis: SCDs Start: 11/05/21 2355.  No chemical prophylaxis due to thrombocytopenia Code Status:   Code Status: Full Code Family Communication: plan of care discussed with patient at bedside. Patient status is: Inpatient due to  ongoing IV antibiotics and febrile neutropenia- and now pending LFTs improvement  Level of care: Progressive  Dispo: The patient is from: HOME            Anticipated disposition: home once LFTs .likley within 24 hours  Mobility Assessment (last 72 hours)     Mobility Assessment     Row Name 11/08/21 2027 11/08/21 0840 11/07/21 2113 11/07/21 0800 11/06/21 1700   Does patient have an order for bedrest or is patient medically unstable No - Continue assessment No - Continue assessment No - Continue assessment No - Continue assessment No - Continue assessment   What is the highest level of mobility based on the progressive mobility assessment? -- Level 4 (Walks with assist in room) - Balance while marching in place and cannot step forward and back - Complete Level 4 (Walks with assist in room) - Balance while marching in place and cannot step forward and back - Complete Level 4 (Walks with assist in room) - Balance while marching in place and cannot step forward and back - Complete Level 4 (Walks with assist in room) - Balance while marching in place and cannot step forward and back - Complete             Objective: Vitals last 24 hrs: Vitals:   11/09/21 0000 11/09/21 0200 11/09/21 0400 11/09/21 0440  BP: 120/76 (!) 136/99 (!) 142/95   Pulse: (!) 103 (!) 101 (!) 113  Resp: (!) 23 (!) 21 18   Temp:    (!) 97.5 F (36.4 C)  TempSrc:    Oral  SpO2: 95% 97% 95%   Weight:      Height:       Weight change:   Physical Examination: General exam: AAox3, older than stated age, weak appearing. HEENT:Oral mucosa moist, Ear/Nose WNL grossly, dentition normal. Respiratory system: bilaterally diminished, no use of accessory muscle Cardiovascular system: S1 & S2 +, No JVD,. Gastrointestinal system: Abdomen soft,NT,ND,BS+ Nervous System:Alert, awake, moving extremities and grossly nonfocal Extremities: Left elbow redness improving  but with limited ROM and w/ tenderness and swelling, distal  peripheral pulses palpable.  Skin: No rashes,no icterus. MSK: Normal muscle bulk,tone, power  Foley, port + Port+  Medications reviewed: Scheduled Meds:  atenolol  50 mg Oral Daily   calcium carbonate  500 mg of elemental calcium Oral Q breakfast   Chlorhexidine Gluconate Cloth  6 each Topical Q0600   doxycycline  100 mg Oral Q12H   DULoxetine  60 mg Oral QHS   feeding supplement  237 mL Oral BID BM   insulin aspart  0-15 Units Subcutaneous TID WC   insulin aspart  0-5 Units Subcutaneous QHS   insulin glargine-yfgn  25 Units Subcutaneous Daily   LORazepam  0.5 mg Oral Q8H   multivitamin with minerals  1 tablet Oral Daily   pantoprazole  40 mg Oral Daily   sodium chloride flush  3 mL Intravenous Q12H   tamsulosin  0.4 mg Oral QPM   valACYclovir  1,000 mg Oral Daily   Continuous Infusions:  lactated ringers 75 mL/hr at 11/09/21 0734   meropenem (MERREM) IV 1 g (11/09/21 0124)      Diet Order             Diet Carb Modified Fluid consistency: Thin; Room service appropriate? Yes  Diet effective now                    Nutrition Problem: Increased nutrient needs Etiology: acute illness, cancer and cancer related treatments Signs/Symptoms: estimated needs Interventions: Ensure Enlive (each supplement provides 350kcal and 20 grams of protein), MVI, Liberalize Diet   Intake/Output Summary (Last 24 hours) at 11/09/2021 0753 Last data filed at 11/09/2021 0734 Gross per 24 hour  Intake 3172.46 ml  Output 6400 ml  Net -3227.54 ml    Net IO Since Admission: -1,679.68 mL [11/09/21 0753]  Wt Readings from Last 3 Encounters:  11/05/21 72.6 kg  10/28/21 73.2 kg  10/07/21 75.5 kg     Unresulted Labs (From admission, onward)     Start     Ordered   11/07/21 0500  CBC with Differential/Platelet  Daily,   R      11/06/21 0918   11/07/21 0500  Comprehensive metabolic panel  Daily,   R      11/06/21 0918          Data Reviewed: I have personally reviewed following labs  and imaging studies CBC: Recent Labs  Lab 11/04/21 0900 11/04/21 0900 11/05/21 2045 11/06/21 0500 11/07/21 0434 11/08/21 0416 11/08/21 1951 11/09/21 0408  WBC 1.2*  --  0.2* 0.2* 0.5* 1.1*  --  2.2*  NEUTROABS 1.0*  --  0.0*  --  0.3* 0.8*  --  1.6*  HGB 11.2*   < > 10.8* 8.8* 8.6* 7.7* 11.6* 11.6*  HCT 32.9*  --  31.6* 25.2* 24.5* 22.0* 32.3* 32.2*  MCV 89.2  --  90.0  88.4 88.1 87.0  --  84.5  PLT 60*  --  37* 20* 17* 23*  --  36*   < > = values in this interval not displayed.    Basic Metabolic Panel: Recent Labs  Lab 11/05/21 2045 11/06/21 0500 11/07/21 0434 11/08/21 0416 11/09/21 0408  NA 132* 135 133* 134* 133*  K 4.8 3.9 3.9 3.7 3.7  CL 100 105 101 100 98  CO2 21* '25 23 25 27  '$ GLUCOSE 259* 163* 161* 212* 137*  BUN '23 16 15 13 12  '$ CREATININE 0.79 0.64 0.50* 0.57* 0.48*  CALCIUM 8.8* 8.1* 8.0* 8.4* 8.4*    GFR: Estimated Creatinine Clearance: 92 mL/min (A) (by C-G formula based on SCr of 0.48 mg/dL (L)). Liver Function Tests: Recent Labs  Lab 11/05/21 2045 11/06/21 0500 11/07/21 0434 11/08/21 0416 11/09/21 0408  AST 16 12* 16 102* 102*  ALT '28 22 22 '$ 99* 151*  ALKPHOS 58 44 46 58 65  BILITOT 1.1 0.7 0.9 0.7 1.1  PROT 6.7 5.3* 5.9* 5.7* 6.3*  ALBUMIN 3.4* 2.6* 2.8* 2.5* 2.9*    No results for input(s): "LIPASE", "AMYLASE" in the last 168 hours. No results for input(s): "AMMONIA" in the last 168 hours. Coagulation Profile: Recent Labs  Lab 11/05/21 2045  INR 1.0    BNP (last 3 results) No results for input(s): "PROBNP" in the last 8760 hours. HbA1C: No results for input(s): "HGBA1C" in the last 72 hours. CBG: Recent Labs  Lab 11/08/21 0718 11/08/21 1130 11/08/21 1623 11/08/21 1944 11/09/21 0718  GLUCAP 180* 218* 244* 217* 164*    Lipid Profile: No results for input(s): "CHOL", "HDL", "LDLCALC", "TRIG", "CHOLHDL", "LDLDIRECT" in the last 72 hours. Thyroid Function Tests: No results for input(s): "TSH", "T4TOTAL", "FREET4", "T3FREE",  "THYROIDAB" in the last 72 hours. Sepsis Labs: Recent Labs  Lab 11/05/21 0112 11/05/21 2045  LATICACIDVEN 1.1 2.0*     Recent Results (from the past 240 hour(s))  Culture, blood (Routine x 2)     Status: None (Preliminary result)   Collection Time: 11/05/21  8:45 PM   Specimen: BLOOD  Result Value Ref Range Status   Specimen Description   Final    BLOOD BLOOD RIGHT HAND Performed at [facility] [address], [place], [place] [address]    Special Requests   Final    BOTTLES DRAWN AEROBIC AND ANAEROBIC Blood Culture results may not be optimal due to an inadequate volume of blood received in culture bottles Performed at [facility] [address], [place], [place] [address]    Culture   Final    NO GROWTH 2 DAYS Performed at [facility] [address] [place], [place] [address]    Report Status PENDING  Incomplete  Urine Culture     Status: Abnormal   Collection Time: 11/05/21  9:20 PM   Specimen: Urine, Catheterized  Result Value Ref Range Status   Specimen Description   Final    URINE, CATHETERIZED Performed at [facility] [address] [place], [place] [address]    Special Requests   Final    NONE Performed at [facility], [place] [address], [place],  [address]    Culture (A)  Final    >=100,000 COLONIES/mL KLEBSIELLA ORNITHINOLYTICA 70,000 COLONIES/mL ENTEROCOCCUS FAECALIS    Report Status 11/09/2021 FINAL  Final   Organism ID, Bacteria KLEBSIELLA ORNITHINOLYTICA (A)  Final   Organism ID, Bacteria ENTEROCOCCUS FAECALIS (A)  Final      Susceptibility   Enterococcus faecalis - MIC*    AMPICILLIN <=2 SENSITIVE Sensitive     NITROFURANTOIN <=16 SENSITIVE Sensitive     VANCOMYCIN 1 SENSITIVE Sensitive     * 70,000 COLONIES/mL ENTEROCOCCUS FAECALIS   Klebsiella ornithinolytica - MIC*    AMPICILLIN >=32 RESISTANT Resistant     CEFAZOLIN >=64 RESISTANT Resistant      CEFEPIME >=32 RESISTANT Resistant     CEFTRIAXONE >=64 RESISTANT Resistant     CIPROFLOXACIN 1 RESISTANT Resistant     GENTAMICIN >=16 RESISTANT Resistant     IMIPENEM <=0.25 SENSITIVE Sensitive     NITROFURANTOIN 128 RESISTANT Resistant     TRIMETH/SULFA >=320 RESISTANT Resistant     AMPICILLIN/SULBACTAM >=32 RESISTANT Resistant     PIP/TAZO 16 SENSITIVE Sensitive     * >=100,000 COLONIES/mL KLEBSIELLA ORNITHINOLYTICA  Culture, blood (Routine x 2)     Status: None (Preliminary result)   Collection Time: 11/05/21 10:39 PM   Specimen: BLOOD  Result Value Ref Range Status   Specimen Description   Final    BLOOD RIGHT PORTA CATH Performed at [facility] [address], [place], [place] [address]    Special Requests   Final    BOTTLES DRAWN AEROBIC AND ANAEROBIC Blood Culture adequate volume Performed at [facility] [address], [place], [place] [address]    Culture   Final    NO GROWTH 2 DAYS Performed at [facility] [address], [place], [place] [address]    Report Status PENDING  Incomplete  Resp Panel by RT-PCR (Flu A&B, Covid) Anterior Nasal Swab     Status: Abnormal   Collection Time: 11/05/21 11:20 PM   Specimen: Anterior Nasal Swab  Result Value Ref Range Status   SARS Coronavirus 2 by RT PCR POSITIVE (A) NEGATIVE Final    Comment: (NOTE) SARS-CoV-2 target nucleic acids are DETECTED.  The SARS-CoV-2 RNA is generally detectable in upper respiratory specimens during the acute phase of infection. Positive results are indicative of the presence of the identified virus, but do not rule out bacterial infection or co-infection with other pathogens not detected by the test. Clinical correlation with patient history and other diagnostic information is necessary to determine patient infection status. The expected result is Negative.  Fact Sheet for Patients: EntrepreneurPulse.com.au  Fact Sheet for  Healthcare Providers: IncredibleEmployment.be  This test is not yet approved or cleared by the [place] FDA and  has been authorized for detection and/or diagnosis of SARS-CoV-2 by FDA under an Emergency Use Authorization (EUA).  This EUA will remain in effect (meaning this test can be used) for the duration of  the COVID-19 declaration under Section 564(b)(1) of the A ct, 21 U.S.C. section 360bbb-3(b)(1), unless the authorization is terminated or revoked sooner.     Influenza A by PCR NEGATIVE NEGATIVE Final   Influenza B by PCR NEGATIVE NEGATIVE Final    Comment: (NOTE) The Xpert Xpress SARS-CoV-2/FLU/RSV plus assay is intended as an aid in the diagnosis of influenza from Nasopharyngeal swab specimens and should not be used as a sole basis for treatment. Nasal washings and aspirates are unacceptable for Xpert Xpress SARS-CoV-2/FLU/RSV testing.  Fact Sheet for Patients: EntrepreneurPulse.com.au  Fact Sheet for Healthcare Providers: IncredibleEmployment.be  This test is not yet approved or cleared by the [place] FDA and has been authorized for detection and/or diagnosis of SARS-CoV-2 by FDA under an Emergency Use Authorization (EUA). This EUA  will remain in effect (meaning this test can be used) for the duration of the COVID-19 declaration under Section 564(b)(1) of the Act, 21 U.S.C. section 360bbb-3(b)(1), unless the authorization is terminated or revoked.  Performed at [facility], [place] [address] [place], [place] [address]   MRSA Next Gen by PCR, Nasal     Status: None   Collection Time: 11/07/21 11:13 AM   Specimen: Nasal Mucosa; Nasal Swab  Result Value Ref Range Status   MRSA by PCR Next Gen NOT DETECTED NOT DETECTED Final    Comment: (NOTE) The GeneXpert MRSA Assay (FDA approved for NASAL specimens only), is one component of a comprehensive MRSA colonization surveillance program.  It is not intended to diagnose MRSA infection nor to guide or monitor treatment for MRSA infections. Test performance is not FDA approved in patients less than 3 years old. Performed at [facility], [place] [address], [place], [place] [address]     Antimicrobials: Anti-infectives (From admission, onward)    Start     Dose/Rate Route Frequency Ordered Stop   11/08/21 2200  doxycycline (VIBRA-TABS) tablet 100 mg        100 mg Oral Every 12 hours 11/08/21 1441     11/07/21 1200  vancomycin (VANCOCIN) IVPB 1000 mg/200 mL premix  Status:  Discontinued        1,000 mg 200 mL/hr over 60 Minutes Intravenous Every 12 hours 11/06/21 0015 11/08/21 1435   11/06/21 1000  valACYclovir (VALTREX) tablet 1,000 mg        1,000 mg Oral Daily 11/05/21 2356     11/06/21 1000  meropenem (MERREM) 1 g in sodium chloride 0.9 % 100 mL IVPB        1 g 200 mL/hr over 30 Minutes Intravenous Every 8 hours 11/06/21 0015     11/06/21 0030  vancomycin (VANCOREADY) IVPB 1500 mg/300 mL        1,500 mg 150 mL/hr over 120 Minutes Intravenous  Once 11/05/21 2333 11/06/21 0304   11/05/21 2345  meropenem (MERREM) 1 g in sodium chloride 0.9 % 100 mL IVPB        1 g 200 mL/hr over 30 Minutes Intravenous  Once 11/05/21 2333 11/06/21 0304   11/05/21 2245  ceFEPIme (MAXIPIME) 2 g in sodium chloride 0.9 % 100 mL IVPB        2 g 200 mL/hr over 30 Minutes Intravenous  Once 11/05/21 2234 11/06/21 0028      Culture/Microbiology    Component Value Date/Time   SDES  11/05/2021 2239    BLOOD RIGHT PORTA CATH Performed at [facility], [place] [address], [place], [place] [address]    SPECREQUEST  11/05/2021 2239    BOTTLES DRAWN AEROBIC AND ANAEROBIC Blood Culture adequate volume Performed at [facility] [address] [place], [place] [address]    CULT  11/05/2021 2239    NO GROWTH 2 DAYS Performed at [facility] [address] [place], [place] [address]     REPTSTATUS PENDING 11/05/2021 2239    Radiology Studies: No results found.   LOS: 3 days   Antonieta Pert, MD [facility]  11/09/2021, 7:53 AM

## 2021-11-09 NOTE — Progress Notes (Signed)
Patient reevaluated today given continued elbow pain and stiffness.  Since I last saw him edema and swelling are markedly improved.  He continues to have severe pain in the posterior aspect of the elbow making difficult for him to range his elbow.  Range of motion today is approximately 45 degrees to 90 degrees flexion/extension.  Full supination pronation but pain at end supination.  Focally tender at the olecranon and distal insertion of the triceps.  Otherwise nontender throughout the elbow and arm.  No palpable fluctuance or fluid collection.  Mild overlying erythema.  No open wounds.  Discussed with patient that given continued pain and stiffness may be is significant tendinopathy versus possible osteomyelitis given the concern for infection would like to obtain an MRI to further evaluate.  Patient and family express understanding agreement the plan.

## 2021-11-09 NOTE — Progress Notes (Signed)
   11/09/21 0906  Readmission Prevention Plan - High Risk  Transportation Screening Complete  PCP or Specialist appointment within 5-7 days of discharge Complete  Home Care Consult (High Risk) Complete  High Risk Social Work Consult for recovery care planning/counseling (includes patient and caregiver) Complete  High Risk Palliative Care Screening Not Applicable  Medication Review Complete

## 2021-11-09 NOTE — Progress Notes (Signed)
Penicillin Allergy Clarification: Oral Amoxicillin Challenge  History of allergy: The patient describes the allergy as a childhood allergy of hives that occurred when he was 66 years old. Has never tried pencillins before. Noted tolerance of 3rd generation cephalosporins.   Type of intervention:  Amoxicillin Oral Challenge. Discussed with the patient and wife who are agreeable to proceed. Discussed plan and monitoring with the RN. The patient tolerated his oral Amoxicillin dose on 7/5 without notable issues of concern.   Impact on therapy: Penicillin allergy removed  Plan - Penicillin allergy de-labeled - Will contact the patient's listed pharmacies to remove the allergy and a message has been sent to his PCP (Dr. Alyson Ingles) - Transition Cefazolin to oral Cefadroxil 1g bid  Thank you for allowing pharmacy to be a part of this patient's care.  Alycia Rossetti, PharmD, BCPS Infectious Diseases Clinical Pharmacist 11/10/2021 8:23 AM   **Pharmacist phone directory can now be found on [email or web address] ([email or web address]).  Listed under [place].

## 2021-11-09 NOTE — Progress Notes (Addendum)
[facility] Infectious Diseases Follow Up Note  Patient Identification: Patient Name: Aaron GIRALDO Sr. 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 WBC up to 2.2 and ANC 1600  Assessment Left elbow cellulitis with possible olecranon bursitis with moderate elbow joint effusion - Attempted aspiration by Ortho 7/2 with dry tap and low suspicion for septic arthritis or olecranon bursitis 2.   Penicillin allergy - hives noted at age 60 with no other symptoms. Willing to do PO amoxicillin challenge. 3.   Diffuse large cell non-Hodgkin's lymphoma ( Extranodal disease) s/p R-CHOP with last cycle 10/28/21 4.   Chronic Foley in the setting of urinary retention ( 7/1 urine cx with Klebsiella ornitholytica and E faecalis ) - no GU symptoms, likely colonization and no indication to treat  5.   Pancytopenia H/o MRSA bacteremia/Port-a-cath and axillary infection in January 2023   Recommendations Continue doxycycline Switch meropenem to cefazolin ( different side chain from penicillin) with concerns for drug induced transaminitis as a  possibility.  Neutropenia has resolved.  Orthopedics re-evaluation of left elbow +/- reimaging PO amoxicillin challenge today Contact precautions given ESBL pattern Klebsiella in urine cx Following   Rest of the management as per the primary team. Thank you for the consult. Please page with pertinent questions or concerns.  ______________________________________________________________________ Subjective patient seen and examined at the bedside.  Left elbow pain getting better although still has swelling and restricted ROM of left elbow Denies cough, chest pain and SOB Denies GI and GU symptoms  Vitals BP 110/78   Pulse (!) 107   Temp 97.9 F (36.6 C) (Oral)   Resp 20   Ht '5\' 11"'$  (1.803 m)   Wt 72.6 kg   SpO2 95%   BMI 22.32 kg/m     Physical Exam Constitutional:  sitting up in the bed. Comfortable    Comments: no mucositis   Cardiovascular:     Rate and Rhythm: Normal rate and regular rhythm.     Heart sounds  Pulmonary:     Effort: Pulmonary effort is normal.     Comments: Normal breath sounds   Abdominal:     Palpations: Abdomen is soft.     Tenderness: non distended and non tender, chronic foley's  Musculoskeletal:        General: Left elbow with mild redness and swelling, limited ROM due to pain. No swelling and tenderness in other peripheral joints   Skin:    Comments: No obvious rashes   Neurological:     General: awake, alert and oriented. Grossly non focal   Psychiatric:        Mood and Affect: Mood normal.   Pertinent Microbiology Results for orders placed or performed during the  hospital encounter of 11/05/21  Culture, blood (Routine x 2)     Status: None (Preliminary result)   Collection Time: 11/05/21  8:45 PM   Specimen: BLOOD  Result Value Ref Range Status   Specimen Description   Final    BLOOD BLOOD RIGHT HAND Performed at [facility] [address] [place], [place] [address]    Special Requests   Final    BOTTLES DRAWN  AEROBIC AND ANAEROBIC Blood Culture results may not be optimal due to an inadequate volume of blood received in culture bottles Performed at [facility] [address], [place], [place] [address]    Culture   Final    NO GROWTH 3 DAYS Performed at [facility], [place] [address], [place], [place] [address]    Report Status PENDING  Incomplete  Urine Culture     Status: Abnormal   Collection Time: 11/05/21  9:20 PM   Specimen: Urine, Catheterized  Result Value Ref Range Status   Specimen Description   Final    URINE, CATHETERIZED Performed at [facility] [address] [place], [place] [address]    Special Requests   Final    NONE Performed at [facility], [place] [address] [place], [place] [address]    Culture (A)  Final    >=100,000 COLONIES/mL KLEBSIELLA ORNITHINOLYTICA 70,000 COLONIES/mL ENTEROCOCCUS FAECALIS    Report Status 11/09/2021 FINAL  Final   Organism ID, Bacteria KLEBSIELLA ORNITHINOLYTICA (A)  Final   Organism ID, Bacteria ENTEROCOCCUS FAECALIS (A)  Final      Susceptibility   Enterococcus faecalis - MIC*    AMPICILLIN <=2 SENSITIVE Sensitive     NITROFURANTOIN <=16 SENSITIVE Sensitive     VANCOMYCIN 1 SENSITIVE Sensitive     * 70,000 COLONIES/mL ENTEROCOCCUS FAECALIS   Klebsiella ornithinolytica - MIC*    AMPICILLIN >=32 RESISTANT Resistant     CEFAZOLIN >=64 RESISTANT Resistant     CEFEPIME >=32 RESISTANT Resistant     CEFTRIAXONE >=64 RESISTANT Resistant     CIPROFLOXACIN 1 RESISTANT Resistant     GENTAMICIN >=16 RESISTANT Resistant     IMIPENEM <=0.25 SENSITIVE Sensitive     NITROFURANTOIN 128 RESISTANT Resistant     TRIMETH/SULFA >=320 RESISTANT Resistant     AMPICILLIN/SULBACTAM >=32 RESISTANT Resistant     PIP/TAZO 16 SENSITIVE Sensitive     * >=100,000 COLONIES/mL KLEBSIELLA ORNITHINOLYTICA  Culture, blood (Routine x 2)     Status: None (Preliminary result)   Collection Time: 11/05/21  10:39 PM   Specimen: BLOOD  Result Value Ref Range Status   Specimen Description   Final    BLOOD RIGHT PORTA CATH Performed at [facility], [place] [address], [place], [place] [address]    Special Requests   Final    BOTTLES DRAWN AEROBIC AND ANAEROBIC Blood Culture adequate volume Performed at [facility] [address] [place], [place] [address]    Culture   Final    NO GROWTH 3 DAYS Performed at [facility], [place] [address], [place], [place] [address]    Report Status PENDING  Incomplete  Resp Panel by RT-PCR (Flu A&B, Covid) Anterior Nasal Swab     Status: Abnormal   Collection Time: 11/05/21 11:20 PM   Specimen: Anterior Nasal Swab  Result Value Ref Range Status   SARS Coronavirus 2 by RT PCR POSITIVE (A) NEGATIVE Final    Comment: (NOTE) SARS-CoV-2 target nucleic  acids are DETECTED.  The SARS-CoV-2 RNA is generally detectable in upper respiratory specimens during the acute phase of infection. Positive results are indicative of the presence of the identified virus, but do not rule out bacterial infection or co-infection with other pathogens not detected by the test. Clinical correlation with patient history and other diagnostic information is necessary to determine patient infection status. The expected result is Negative.  Fact Sheet for Patients: EntrepreneurPulse.com.au  Fact Sheet for Healthcare Providers: IncredibleEmployment.be  This test is not yet approved or cleared by the [place] FDA and  has been authorized for detection and/or diagnosis of SARS-CoV-2 by FDA under an Emergency Use Authorization (EUA).  This EUA will remain in effect (meaning this test can be used) for the duration of  the COVID-19 declaration under Section 564(b)(1) of the A ct, 21 U.S.C. section 360bbb-3(b)(1), unless the authorization is terminated or revoked sooner.     Influenza A by PCR NEGATIVE  NEGATIVE Final   Influenza B by PCR NEGATIVE NEGATIVE Final    Comment: (NOTE) The Xpert Xpress SARS-CoV-2/FLU/RSV plus assay is intended as an aid in the diagnosis of influenza from Nasopharyngeal swab specimens and should not be used as a sole basis for treatment. Nasal washings and aspirates are unacceptable for Xpert Xpress SARS-CoV-2/FLU/RSV testing.  Fact Sheet for Patients: EntrepreneurPulse.com.au  Fact Sheet for Healthcare Providers: IncredibleEmployment.be  This test is not yet approved or cleared by the [place] FDA and has been authorized for detection and/or diagnosis of SARS-CoV-2 by FDA under an Emergency Use Authorization (EUA). This EUA will remain in effect (meaning this test can be used) for the duration of the COVID-19 declaration under Section 564(b)(1) of the Act, 21 U.S.C. section 360bbb-3(b)(1), unless the authorization is terminated or revoked.  Performed at [facility], [place] [address], [place], [place] [address]   MRSA Next Gen by PCR, Nasal     Status: None   Collection Time: 11/07/21 11:13 AM   Specimen: Nasal Mucosa; Nasal Swab  Result Value Ref Range Status   MRSA by PCR Next Gen NOT DETECTED NOT DETECTED Final    Comment: (NOTE) The GeneXpert MRSA Assay (FDA approved for NASAL specimens only), is one component of a comprehensive MRSA colonization surveillance program. It is not intended to diagnose MRSA infection nor to guide or monitor treatment for MRSA infections. Test performance is not FDA approved in patients less than 56 years old. Performed at [facility], [place] [address] [place], [place] [address]     Pertinent Lab.    Latest Ref Rng & Units 11/09/2021    4:08 AM 11/08/2021    7:51 PM 11/08/2021    4:16 AM  CBC  WBC 4.0 - 10.5 K/uL 2.2   1.1   Hemoglobin 13.0 - 17.0 g/dL 11.6  11.6  7.7   Hematocrit 39.0 - 52.0 % 32.2  32.3  22.0   Platelets 150 - 400  K/uL 36   23       Latest Ref Rng & Units 11/09/2021    4:08 AM 11/08/2021    4:16 AM 11/07/2021    4:34 AM  CMP  Glucose 70 - 99 mg/dL 137  212  161   BUN 8 - 23 mg/dL '12  13  15   '$ Creatinine 0.61 - 1.24 mg/dL 0.48  0.57  0.50   Sodium 135 - 145 mmol/L 133  134  133   Potassium 3.5 - 5.1 mmol/L 3.7  3.7  3.9   Chloride 98 - 111 mmol/L 98  100  101   CO2 22 - 32 mmol/L '27  25  23   '$ Calcium 8.9 - 10.3 mg/dL 8.4  8.4  8.0   Total Protein 6.5 - 8.1 g/dL 6.3  5.7  5.9   Total Bilirubin 0.3 - 1.2 mg/dL 1.1  0.7  0.9   Alkaline Phos 38 - 126 U/L 65  58  46   AST 15 - 41 U/L 102  102  16   ALT 0 - 44 U/L 151  99  22      Pertinent Imaging today Plain films and CT images have been personally visualized and interpreted; radiology reports have been reviewed. Decision making incorporated into the Impression / Recommendations.  CT EBLOW LEFT WWO CONTRAST  Result Date: 11/06/2021 CLINICAL DATA:  Left elbow swelling. Bone lesion, elbow, incidental. Febrile neutropenia. History of lymphoma. Evaluate for abscess or fluid. EXAM: CT OF THE UPPER LEFT EXTREMITY WITHOUT AND WITH CONTRAST TECHNIQUE: Multidetector CT imaging of the left elbow was performed following the standard protocol before and during bolus administration of intravenous contrast. RADIATION DOSE REDUCTION: This exam was performed according to the departmental dose-optimization program which includes automated exposure control, adjustment of the mA and/or kV according to patient size and/or use of iterative reconstruction technique. COMPARISON:  No radiographs are available.  PET-CT 07/26/2021. CONTRAST:  139m OMNIPAQUE IOHEXOL 300 MG/ML  SOLN FINDINGS: Bones/Joint/Cartilage No evidence of acute fracture, dislocation or bone destruction. Mild spurring of the olecranon and coronoid processes. No erosive changes are identified. There is a moderate size joint effusion without apparent synovial enhancement. Previous T12 spinal augmentation and  multilevel spondylosis are noted. Ligaments Suboptimally assessed by CT. Muscles and Tendons No intramuscular fluid collection or abnormal enhancement identified. The biceps and triceps tendons appear intact. Soft tissues Moderate dorsal subcutaneous edema without focal fluid collection or abnormal enhancement. No foreign body or soft tissue emphysema noted in this area. There is a small amount of anterior subcutaneous emphysema which appears intravascular, likely related to an IV catheter. IMPRESSION: 1. Non focal subcutaneous edema posteriorly, suggesting soft tissue infection (cellulitis) with possible olecranon bursitis. 2. Nonspecific moderate-sized elbow joint effusion. Cannot exclude septic arthritis. 3. No evidence of soft tissue abscess, osteomyelitis or acute osseous abnormality. Electronically Signed   By: WRichardean SaleM.D.   On: 11/06/2021 12:14   DG Chest 2 View  Result Date: 11/05/2021 CLINICAL DATA:  Generalized weakness EXAM: CHEST - 2 VIEW COMPARISON:  08/13/2021 FINDINGS: Cardiac shadow is stable. Right chest wall port is stable. Linear scarring in the left base is again noted. No sizable effusion is seen. Changes of prior vertebral augmentation are noted. IMPRESSION: Left basilar scarring. Electronically Signed   By: MInez CatalinaM.D.   On: 11/05/2021 20:55     I spent 51 minutes for this patient encounter including review of prior medical records, coordination of care with primary/other specialist with greater than 50% of time being face to face/counseling and discussing diagnostics/treatment plan with the patient/family.  Electronically signed by:   SRosiland Oz MD Infectious Disease Physician [facility]St [facility] Infect[facility] Pager: [phone]

## 2021-11-10 ENCOUNTER — Other Ambulatory Visit (HOSPITAL_BASED_OUTPATIENT_CLINIC_OR_DEPARTMENT_OTHER): Payer: Self-pay

## 2021-11-10 DIAGNOSIS — I7143 Infrarenal abdominal aortic aneurysm, without rupture: Secondary | ICD-10-CM

## 2021-11-10 DIAGNOSIS — D709 Neutropenia, unspecified: Secondary | ICD-10-CM | POA: Diagnosis not present

## 2021-11-10 DIAGNOSIS — J449 Chronic obstructive pulmonary disease, unspecified: Secondary | ICD-10-CM | POA: Diagnosis not present

## 2021-11-10 DIAGNOSIS — R5081 Fever presenting with conditions classified elsewhere: Secondary | ICD-10-CM | POA: Diagnosis not present

## 2021-11-10 DIAGNOSIS — Z978 Presence of other specified devices: Secondary | ICD-10-CM | POA: Diagnosis not present

## 2021-11-10 LAB — CBC WITH DIFFERENTIAL/PLATELET
Abs Immature Granulocytes: 0.17 10*3/uL — ABNORMAL HIGH (ref 0.00–0.07)
Basophils Absolute: 0 10*3/uL (ref 0.0–0.1)
Basophils Relative: 1 %
Eosinophils Absolute: 0 10*3/uL (ref 0.0–0.5)
Eosinophils Relative: 0 %
HCT: 35.3 % — ABNORMAL LOW (ref 39.0–52.0)
Hemoglobin: 12.5 g/dL — ABNORMAL LOW (ref 13.0–17.0)
Immature Granulocytes: 6 %
Lymphocytes Relative: 8 %
Lymphs Abs: 0.2 10*3/uL — ABNORMAL LOW (ref 0.7–4.0)
MCH: 30.5 pg (ref 26.0–34.0)
MCHC: 35.4 g/dL (ref 30.0–36.0)
MCV: 86.1 fL (ref 80.0–100.0)
Monocytes Absolute: 0.4 10*3/uL (ref 0.1–1.0)
Monocytes Relative: 12 %
Neutro Abs: 2.2 10*3/uL (ref 1.7–7.7)
Neutrophils Relative %: 73 %
Platelets: 51 10*3/uL — ABNORMAL LOW (ref 150–400)
RBC: 4.1 MIL/uL — ABNORMAL LOW (ref 4.22–5.81)
RDW: 15.2 % (ref 11.5–15.5)
WBC: 3 10*3/uL — ABNORMAL LOW (ref 4.0–10.5)
nRBC: 0 % (ref 0.0–0.2)

## 2021-11-10 LAB — GLUCOSE, CAPILLARY
Glucose-Capillary: 197 mg/dL — ABNORMAL HIGH (ref 70–99)
Glucose-Capillary: 281 mg/dL — ABNORMAL HIGH (ref 70–99)

## 2021-11-10 LAB — COMPREHENSIVE METABOLIC PANEL
ALT: 124 U/L — ABNORMAL HIGH (ref 0–44)
AST: 61 U/L — ABNORMAL HIGH (ref 15–41)
Albumin: 3.1 g/dL — ABNORMAL LOW (ref 3.5–5.0)
Alkaline Phosphatase: 73 U/L (ref 38–126)
Anion gap: 9 (ref 5–15)
BUN: 14 mg/dL (ref 8–23)
CO2: 26 mmol/L (ref 22–32)
Calcium: 8.6 mg/dL — ABNORMAL LOW (ref 8.9–10.3)
Chloride: 100 mmol/L (ref 98–111)
Creatinine, Ser: 0.46 mg/dL — ABNORMAL LOW (ref 0.61–1.24)
GFR, Estimated: >60 mL/min (ref >60)
Glucose, Bld: 181 mg/dL — ABNORMAL HIGH (ref 70–99)
Potassium: 4.1 mmol/L (ref 3.5–5.1)
Sodium: 135 mmol/L (ref 135–145)
Total Bilirubin: 1 mg/dL (ref 0.3–1.2)
Total Protein: 6.5 g/dL (ref 6.5–8.1)

## 2021-11-10 MED ORDER — DOXYCYCLINE HYCLATE 100 MG PO TABS
100.0000 mg | ORAL_TABLET | Freq: Two times a day (BID) | ORAL | 0 refills | Status: DC
  Filled 2021-11-10: qty 16, 8d supply, fill #0

## 2021-11-10 MED ORDER — HEPARIN SOD (PORK) LOCK FLUSH 100 UNIT/ML IV SOLN
500.0000 [IU] | INTRAVENOUS | Status: AC | PRN
  Administered 2021-11-10: 500 [IU]

## 2021-11-10 MED ORDER — CEFADROXIL 500 MG PO CAPS
1000.0000 mg | ORAL_CAPSULE | Freq: Two times a day (BID) | ORAL | Status: DC
  Administered 2021-11-10: 1000 mg via ORAL
  Filled 2021-11-10: qty 2

## 2021-11-10 MED ORDER — ACETAMINOPHEN 325 MG PO TABS: 650.0000 mg | ORAL_TABLET | Freq: Four times a day (QID) | ORAL | 0 refills | Status: AC | PRN

## 2021-11-10 MED ORDER — DOXYCYCLINE HYCLATE 100 MG PO TABS: 100.0000 mg | ORAL_TABLET | Freq: Two times a day (BID) | ORAL | 0 refills | Status: AC

## 2021-11-10 MED ORDER — CEFADROXIL 500 MG PO CAPS
1000.0000 mg | ORAL_CAPSULE | Freq: Two times a day (BID) | ORAL | 0 refills | Status: DC
  Filled 2021-11-10: qty 32, 8d supply, fill #0

## 2021-11-10 MED ORDER — CEFADROXIL 500 MG PO CAPS: 1000.0000 mg | ORAL_CAPSULE | Freq: Two times a day (BID) | ORAL | 0 refills | Status: AC

## 2021-11-10 NOTE — Progress Notes (Signed)
Overall, everything is about the same.  He did have an MRI of the left elbow yesterday.  This did not show any abscess or any obvious fluid collection.  Orthopedic Surgery came by to see him.  They are following this closely.  His LFTs were thought to be elevated because of antibiotics.  ID has made some recommendations and changes.  His liver function studies are better.  His white cell count is now up to 3000.  Platelet count 51,000.  He has had no fever.  He has had no nausea or vomiting.  There is been no obvious bleeding.  Still has the Foley catheter in.  It would really be nice and be very helpful for him if Urology saw him while in the hospital to try to see if that catheter cannot come out.  This catheter is causing infections for him.  He has another Klebsiella infection.  His urine culture she is also growing Enterococcus.  His blood sugars are on the higher side.  There were 181 this morning.  All of his vital signs are stable.  Temperature is 98.  Pulse 95.  Blood pressure 110/78.  His head neck exam shows no ocular or oral lesions.  He has no mucositis.  Lungs are clear.  Cardiac exam regular rate and rhythm.  He has no murmurs.  Abdomen is soft.  Bowel sounds are present.  They may be slightly decreased.  Extremity shows no clubbing, cyanosis or edema.  He does have the some swelling and pain in the left elbow.  There is decreased range of motion of the left elbow.  Neurological exam is nonfocal.  Mr. Misiaszek has multiple infectious sites.  He has a Klebsiella and Enterococcus in his urine.  He has a cellulitis with the left elbow.  He is on Ancef.  Hopefully, the Enterococcus will be receptive to oral antibiotics when the time comes for him to go home.  Hopefully, he will be able to go home in a couple days.   Lattie Haw, MD  Vonna Kotyk 1:9

## 2021-11-10 NOTE — Progress Notes (Signed)
Pt and wife given discharge instructions.  Meds sent to [facility].  Pt given leg bag for transport home.  IV removed and port deaccessed.  Foley exchanged on 11/09/21.  Pt left with belongings.  Taken to front entrance via wheelchair.  Wife provided transportation.

## 2021-11-10 NOTE — Evaluation (Signed)
Occupational Therapy Evaluation Patient Details Name: Aaron MOTTOLA Sr. MRN: [id number] DOB: July 06, 1955 Today's Date: 11/10/2021   History of Present Illness 66 y.o. male with medical history significant for B-cell lymphoma undergoing R-CHOP, COPD, depression, dementia, essential hypertension, type 2 diabetes mellitus, chronic hyponatremia, rib fxs 8-10.   Clinical Impression   Patient is a 66 year old male who was admitted for above. Patient reported plan was to transition home today. Patient and wife endorse patient is at his baseline for ADLs at this time. Patient was educated on LUE movement and elevation. Patient verbalized and demonstrated understanding. No further acute skilled OT needs observed on this date. Thank you for this referral.      Recommendations for follow up therapy are one component of a multi-disciplinary discharge planning process, led by the attending physician.  Recommendations may be updated based on patient status, additional functional criteria and insurance authorization.   Follow Up Recommendations  No OT follow up    Assistance Recommended at Discharge Frequent or constant Supervision/Assistance  Patient can return home with the following A little help with walking and/or transfers;Assistance with cooking/housework;A little help with bathing/dressing/bathroom;Direct supervision/assist for financial management;Assist for transportation;Help with stairs or ramp for entrance;Direct supervision/assist for medications management    Functional Status Assessment  Patient has had a recent decline in their functional status and demonstrates the ability to make significant improvements in function in a reasonable and predictable amount of time.  Equipment Recommendations  None recommended by OT    Recommendations for Other Services       Precautions / Restrictions Precautions Precautions: Fall Restrictions Weight Bearing Restrictions: No      Mobility Bed  Mobility Overal bed mobility: Modified Independent                  Transfers                          Balance Overall balance assessment: Needs assistance Sitting-balance support: No upper extremity supported Sitting balance-Leahy Scale: Good     Standing balance support: No upper extremity supported Standing balance-Leahy Scale: Good                             ADL either performed or assessed with clinical judgement   ADL Overall ADL's : At baseline                                       General ADL Comments: patient and wife endorsed that patient was at baseline for ADLs at this time with patient able to complete LB dressing, bed mobiltiy and standing with MI. patient inquired about using rollator at home. patient was educated that if it made him feel more comfortable then it would be good to have something since he was holding onto IV here. patient was eduacted on safety with Rollator. patient and wife verbalized understanding reporting it was a parkinsons one and they were familiar with using it prior during treatments. patient was educated on genlte ROM for LUE and maintaining ability to engage in ADLs with LUE daily. patient and wife verbalized understanding. patient demonstrated understanding. patient was educated on keeping LUE elevated to prevent edema build up. patient demonstrated understanding.     Vision Patient Visual Report: No change from baseline  Perception     Praxis      Pertinent Vitals/Pain Pain Assessment Pain Assessment: Faces Faces Pain Scale: Hurts little more Pain Location: L elbow Pain Descriptors / Indicators: Discomfort, Grimacing Pain Intervention(s): Monitored during session     Hand Dominance Right   Extremity/Trunk Assessment Upper Extremity Assessment Upper Extremity Assessment: LUE deficits/detail LUE Deficits / Details: patient was noted to be able to extend elbow from 90 degrees to  about 15 degrees off of full extension with continued edema noted in posterior elbow area. patient reported continued patient but reported it was better than previous days.   Lower Extremity Assessment Lower Extremity Assessment: Defer to PT evaluation   Cervical / Trunk Assessment Cervical / Trunk Assessment: Kyphotic   Communication Communication Communication: No difficulties   Cognition Arousal/Alertness: Awake/alert Behavior During Therapy: WFL for tasks assessed/performed Overall Cognitive Status: Within Functional Limits for tasks assessed                                 General Comments: patients wife was present during session.     General Comments       Exercises     Shoulder Instructions      Home Living Family/patient expects to be discharged to:: Private residence Living Arrangements: Spouse/significant other Available Help at Discharge: Family;Available 24 hours/day Type of Home: House Home Access: Ramped entrance;Stairs to enter Entrance Stairs-Number of Steps: 3 Entrance Stairs-Rails: Can reach both Home Layout: One level     Bathroom Shower/Tub: Teacher, early years/pre: Handicapped height Bathroom Accessibility: Yes   Home Equipment: Conservation officer, nature (2 wheels)   Additional Comments: Pt lives in remote house with wife, 2 protective dogs      Prior Functioning/Environment Prior Level of Function : Independent/Modified Independent;Driving             Mobility Comments: Pt able to move independently using RW when necessary, needing assist from wife when necessary. ADLs Comments: indep, drives        OT Problem List: Decreased activity tolerance;Impaired balance (sitting and/or standing);Decreased safety awareness;Cardiopulmonary status limiting activity;Decreased knowledge of precautions;Decreased knowledge of use of DME or AE      OT Treatment/Interventions: Self-care/ADL training;Therapeutic exercise;Neuromuscular  education;Energy conservation;DME and/or AE instruction;Therapeutic activities;Balance training;Patient/family education    OT Goals(Current goals can be found in the care plan section) Acute Rehab OT Goals Patient Stated Goal: to go home OT Goal Formulation: All assessment and education complete, DC therapy  OT Frequency: Min 2X/week    Co-evaluation              AM-PAC OT "6 Clicks" Daily Activity     Outcome Measure Help from another person eating meals?: None Help from another person taking care of personal grooming?: None Help from another person toileting, which includes using toliet, bedpan, or urinal?: A Little Help from another person bathing (including washing, rinsing, drying)?: A Little Help from another person to put on and taking off regular upper body clothing?: A Little Help from another person to put on and taking off regular lower body clothing?: A Little 6 Click Score: 20   End of Session    Activity Tolerance: Patient tolerated treatment well Patient left: in bed;with call bell/phone within reach;with family/visitor present;with bed alarm set  OT Visit Diagnosis: Unsteadiness on feet (R26.81);Other abnormalities of gait and mobility (R26.89);Muscle weakness (generalized) (M62.81)  Time: 9499-7182 OT Time Calculation (min): 13 min Charges:  OT General Charges $OT Visit: 1 Visit OT Evaluation $OT Eval Moderate Complexity: 1 Mod  Jackelyn Poling OTR/L, MS Acute Rehabilitation Department Office# [phone] Pager# [phone]   Aaron Weaver 11/10/2021, 12:37 PM

## 2021-11-10 NOTE — Evaluation (Signed)
Physical Therapy Evaluation Patient Details Name: Aaron HALLEY Sr. MRN: [id number] DOB: Nov 27, 1955 Today's Date: 11/10/2021  History of Present Illness  66 y.o. male with medical history significant for B-cell lymphoma undergoing R-[facility]OP, COPD, depression, dementia, essential hypertension, type 2 diabetes mellitus, chronic hyponatremia, rib fxs 8-10.  Clinical Impression  Pt presents with functional deficits due to above HPI. Pt ambulated ~65 ft pushing IV pole and completed 2 LE exercises in seated. PTA pt lives with wife in one level house in remote location. Wife is available to assist with mobility. Currently pt requires Min guard/Min assist with transfers and gait, and supervision with bed mobility. Pt is limited by fatigue and would benefit from therapy to address above impairments by continuing HEP independently. Pt is safe to return home from PT standpoint. Will continue to follow in acute setting.      Recommendations for follow up therapy are one component of a multi-disciplinary discharge planning process, led by the attending physician.  Recommendations may be updated based on patient status, additional functional criteria and insurance authorization.  Follow Up Recommendations No PT follow up      Assistance Recommended at Discharge Intermittent Supervision/Assistance  Patient can return home with the following  A little help with walking and/or transfers;A little help with bathing/dressing/bathroom;Assistance with cooking/housework;Assist for transportation;Help with stairs or ramp for entrance    Equipment Recommendations None recommended by PT  Recommendations for Other Services       Functional Status Assessment Patient has had a recent decline in their functional status and demonstrates the ability to make significant improvements in function in a reasonable and predictable amount of time.     Precautions / Restrictions Precautions Precautions:  Fall Restrictions Weight Bearing Restrictions: No      Mobility  Bed Mobility Overal bed mobility: Needs Assistance Bed Mobility: Supine to Sit     Supine to sit: Supervision     General bed mobility comments: Pt able to sit at EOB needing no tactile or verbal cues,    Transfers Overall transfer level: Needs assistance Equipment used: None Transfers: Sit to/from Stand Sit to Stand: Min assist           General transfer comment: Pt required VC's for directing power up safely, required trunk lift assist to get into standing with increased time. No dizziness reported.    Ambulation/Gait Ambulation/Gait assistance: Min guard, +2 safety/equipment Gait Distance (Feet): 65 Feet Assistive device: IV Pole Gait Pattern/deviations: Step-through pattern, Decreased stride length, Trunk flexed Gait velocity: decr     General Gait Details: Pt ambulated with head down looking at feet, needing VC's to keep head up for environment awareness. Pt pushed IV pole with right arm during ambulation with chair follow up for safety, requesting to turn around and walk back to room after ~30 ft. No presence of SOB.  Stairs            Wheelchair Mobility    Modified Rankin (Stroke Patients Only)       Balance Overall balance assessment: Needs assistance Sitting-balance support: No upper extremity supported Sitting balance-Leahy Scale: Good     Standing balance support: No upper extremity supported Standing balance-Leahy Scale: Good Standing balance comment: Pt did not use AD during standing balance, however demonstrated concern for competence if mod challenge were to occur.                             Pertinent Vitals/Pain  Home Living Family/patient expects to be discharged to:: Private residence Living Arrangements: Spouse/significant other Available Help at Discharge: Family;Available 24 hours/day Type of Home: House Home Access: Ramped entrance;Stairs  to enter (Stairs at back entrance) Entrance Stairs-Rails: Can reach both Entrance Stairs-Number of Steps: 3   Home Layout: One level Home Equipment: Conservation officer, nature (2 wheels) Additional Comments: Pt lives in remote house with wife, 2 protective dogs    Prior Function Prior Level of Function : Independent/Modified Independent;Driving             Mobility Comments: Pt able to move independently using RW when necessary, needing assist from wife when necessary.       Hand Dominance   Dominant Hand: Right    Extremity/Trunk Assessment   Upper Extremity Assessment Upper Extremity Assessment: Defer to OT evaluation    Lower Extremity Assessment Lower Extremity Assessment: Generalized weakness;Overall [facility] for tasks assessed    Cervical / Trunk Assessment Cervical / Trunk Assessment: Kyphotic  Communication   Communication: No difficulties  Cognition Arousal/Alertness: Awake/alert Behavior During Therapy: WFL for tasks assessed/performed Overall Cognitive Status: Within Functional Limits for tasks assessed                                 General Comments: Pt pleasant and understood what we instructed, complied easily.        General Comments      Exercises General Exercises - Lower Extremity Ankle Circles/Pumps: AROM, Both, 20 reps, Seated Long Arc Quad: AROM, Both, 15 reps, Seated   Assessment/Plan    PT Assessment Patient needs continued PT services  PT Problem List Decreased activity tolerance;Decreased balance;Decreased mobility;Decreased knowledge of precautions       PT Treatment Interventions Gait training;Functional mobility training;Therapeutic activities;Therapeutic exercise;Balance training;Patient/family education    PT Goals (Current goals can be found in the Care Plan section)  Acute Rehab PT Goals Patient Stated Goal: TO go home PT Goal Formulation: With patient Time For Goal Achievement: 11/24/21 Potential to Achieve Goals:  Good    Frequency Min 3X/week     Co-evaluation               AM-PAC PT "6 Clicks" Mobility  Outcome Measure Help needed turning from your back to your side while in a flat bed without using bedrails?: None Help needed moving from lying on your back to sitting on the side of a flat bed without using bedrails?: None Help needed moving to and from a bed to a chair (including a wheelchair)?: A Little Help needed standing up from a chair using your arms (e.g., wheelchair or bedside chair)?: A Little Help needed to walk in hospital room?: A Little Help needed climbing 3-5 steps with a railing? : A Little 6 Click Score: 20    End of Session Equipment Utilized During Treatment: Gait belt Activity Tolerance: Patient tolerated treatment well Patient left: in chair;with call bell/phone within reach Nurse Communication: Mobility status PT Visit Diagnosis: Unsteadiness on feet (R26.81);Other abnormalities of gait and mobility (R26.89);Repeated falls (R29.6);Muscle weakness (generalized) (M62.81)    Time:  -      Charges:              Margie Ege, SPT [place] 11/10/2021, 9:40 AM

## 2021-11-10 NOTE — Discharge Summary (Signed)
Physician Discharge Summary  DEEP BONAWITZ Sr. VCB:[id number] DOB: 1955/10/02 DOA: 11/05/2021  PCP: [name], MD  Admit date: 11/05/2021 Discharge date: 11/10/2021  Admitted From: Inpatient Disposition: home  Recommendations for Outpatient Follow-up:  Follow up with PCP in 1-2 weeks Please obtain BMP/CBC in one week Please follow up on the following pending results:  Home Health:No Equipment/Devices:no new equipment  Discharge Condition:Stable CODE STATUS:Full code Diet recommendation: Diabetic diet  Brief/Interim Summary: 66yom w/ significant history of lymphoma, pancytopenia, diabetes, hyperlipidemia, RLS, anxiety, depression, hypertension, COPD, atrial fibrillation, urinary retention, GERD, AAA presented 1 to 2 days of weakness body aches subjective fever, fairly similar to previous episodes of sepsis, also had redness at left elbow, has been on chronic Bactrim and Macrobid for UTI suppression with indwelling Foley, and famciclovir, last cycle of chemo was recently 5 days PTA and had 2 units of PRBC and pegfilgrastim. In the ED felt to be in sepsis with fever, neutropenia, tachycardia.  Labs with hyperglycemia, lactic acidosis, thrombocytopenia anemia.Urinalysis with bacteria but otherwise unable to interpret due to pigment/blood.  Urine culture and blood culture pending.  Chest x-ray with left basilar scarring. He also tested positive for COVID on admission and had tested +5 months ago and 4 months ago.  Blood cultures were sent, started on meropenem, valacyclovir, vancomycin.  He has history of resistant Klebsiella. He has some skin changes on the left elbow, questionable cellulitis.  Seen by orthopedics who attempted but unable to aspirate any fluid from the left elbow joint less concern for septic arthritis or bursitis.  Patient continued to improve clinically with IV antibiotics followed by infectious disease.  Received 2 units PRBC 7/4. Platelet WBC count neutrophils are nicely  trending up. 7/5 Noticed to have abnormal LFTs trending up but have since imp[roved and are declining.  As noted patient did have a chemotherapy-induced neutropenia which was concerning in the setting of fevers and cellulitis.  Patient has been seen by oncology, ID, and orthopedics who did not feel it was a septic arthritis or septic olecranon bursitis.  Patient continued to improve and ID recommendations day of discharge were for doxycycline and cefadroxil, end date July 14.  Patient does have a chronic Foley in the setting of urinary retention and his Foley was changed out July 5 per my conversation with nursing staff today  who was present in the room when it was changed.  Has improved today his pancytopenia is improving he will follow-up with oncology for his lymphoma and will follow-up with his PCP for continued management of his chronic medical problems.    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)   Pancytopenia (HCC)   Paroxysmal A-fib (HCC)   GERD (gastroesophageal reflux disease)   Chronic indwelling Foley catheter   Sepsis [facility])    Discharge Instructions  Discharge Instructions     Call MD for:  difficulty breathing, headache or visual disturbances   Complete by: As directed    Call MD for:  persistant nausea and vomiting   Complete by: As directed    Call MD for:  severe uncontrolled pain   Complete by: As directed  Call MD for:  temperature >100.4   Complete by: As directed    Diet - low sodium heart healthy   Complete by: As directed    Increase activity slowly   Complete by: As directed       Allergies  as of 11/10/2021       Reactions   Fexofenadine Other (See Comments)   "dries me out too much"   Pravastatin Sodium Other (See Comments)   Aches   Requip [ropinirole Hcl] Other (See Comments)   Bad dreams   Hydrochlorothiazide Other (See Comments)   Cramps        Medication List     STOP taking these medications    nitrofurantoin (macrocrystal-monohydrate) 100 MG capsule Commonly known as: Macrobid   predniSONE 20 MG tablet Commonly known as: DELTASONE   sulfamethoxazole-trimethoprim 800-160 MG tablet Commonly known as: BACTRIM DS       TAKE these medications    acetaminophen 325 MG tablet Commonly known as: TYLENOL Take 650 mg by mouth every 6 (six) hours as needed for mild pain or headache.   albuterol 108 (90 Base) MCG/ACT inhaler Commonly known as: ProAir HFA Inhale 1-2 puffs into the lungs every 6 (six) hours as needed for wheezing or shortness of breath.   alendronate 70 MG tablet Commonly known as: FOSAMAX Take 70 mg by mouth every Sunday.   allopurinol 100 MG tablet Commonly known as: Zyloprim Take 1 tablet (100 mg total) by mouth daily. Start 3 days BEFORE chemotherapy starts   aspirin EC 81 MG tablet Take 1 tablet (81 mg total) by mouth daily at 6 (six) AM. Swallow whole. What changed: when to take this   atenolol 100 MG tablet Commonly known as: TENORMIN Take 100 mg by mouth daily.   blood glucose meter kit and supplies Kit Dispense based on patient and insurance preference. Use up to four times daily as directed.   calcium carbonate 1500 (600 Ca) MG Tabs tablet Commonly known as: OSCAL Take 600 mg of elemental calcium by mouth daily with breakfast.   cefadroxil 500 MG capsule Commonly known as: DURICEF Take 2 capsules (1,000 mg total) by mouth 2 (two) times daily for 8 days.   diclofenac Sodium 1 % Gel Commonly known as: VOLTAREN Apply 2 g topically daily as needed (to painful sites).   doxycycline 100 MG tablet Commonly known as:  VIBRA-TABS Take 1 tablet (100 mg total) by mouth every 12 (twelve) hours for 8 days.   DULoxetine 60 MG capsule Commonly known as: CYMBALTA Take 60 mg by mouth at bedtime.   famciclovir 250 MG tablet Commonly known as: FAMVIR Take 1 tablet (250 mg total) by mouth 2 (two) times daily.   Fish Oil Oil Take 1,500 mg by mouth daily.   fluconazole 100 MG tablet Commonly known as: DIFLUCAN Take 1 tablet (100 mg total) by mouth daily.   FreeStyle Libre 14 Day Sensor Misc Inject 1 Device into the skin every 14 (fourteen) days.   FreeStyle Libre 2 Reader Kerrin Mo   insulin glargine 100 UNIT/ML Solostar Pen Commonly known as: LANTUS Inject 25 Units into the skin daily.   insulin lispro 100 UNIT/ML KwikPen Commonly known as: HUMALOG Inject 2-15 Units into the skin 4 (four) times daily - after meals and at bedtime. Glucose 121 - 150: 2 units, Glucose 151 - 200: 3 units, Glucose 201 - 250: 5 units, Glucose 251 - 300: 8 units, Glucose 301 - 350: 11 units, Glucose 351 - 400: 15  units, Glucose > 400 call MD   Insulin Pen Needle 32G X 4 MM Misc 1 each by Does not apply route 4 (four) times daily - after meals and at bedtime.   LORazepam 0.5 MG tablet Commonly known as: ATIVAN Take 1 tablet (0.5 mg total) by mouth every 8 (eight) hours. Take for nausea as needed   MULTIVITAMIN ADULT PO Take 1 tablet by mouth daily.   omeprazole 40 MG capsule Commonly known as: PRILOSEC Take 40 mg by mouth every evening.   potassium chloride SA 20 MEQ tablet Commonly known as: Klor-Con M20 TAKE 1 TABLET (20 MEQ TOTAL) BY MOUTH DAILY. What changed: when to take this   rosuvastatin 10 MG tablet Commonly known as: CRESTOR Take 10 mg by mouth daily.   tadalafil 5 MG tablet Commonly known as: CIALIS Take 5 mg by mouth in the morning.   tamsulosin 0.4 MG Caps capsule Commonly known as: FLOMAX Take 0.4 mg by mouth every evening.   Zinc 50 MG Tabs Take 50 mg by mouth daily.        Allergies   Allergen Reactions   Fexofenadine Other (See Comments)    "dries me out too much"   Pravastatin Sodium Other (See Comments)    Aches   Requip [Ropinirole Hcl] Other (See Comments)    Bad dreams   Hydrochlorothiazide Other (See Comments)    Cramps    Consultations: Orthopedics, infectious disease, oncology   Procedures/Studies: MR ELBOW LEFT W WO CONTRAST  Result Date: 11/09/2021 CLINICAL DATA:  Soft tissue infection suspected. EXAM: MRI OF THE LEFT ELBOW WITHOUT AND WITH CONTRAST TECHNIQUE: Multiplanar, multisequence MR imaging of the elbow was performed before and after the administration of intravenous contrast. CONTRAST:  69m GADAVIST GADOBUTROL 1 MMOL/ML IV SOLN COMPARISON:  CT examination dated November 06, 2021 FINDINGS: TENDONS Common forearm flexor origin: Intact Common forearm extensor origin: Intact Biceps: Intact Triceps: Intact LIGAMENTS Medial stabilizers: Intact Lateral stabilizers:  Intact Cartilage: No chondral defect. Joint: Small joint effusion with synovial enhancement on post-contrast sequences. Cubital tunnel: Normal. Bones: No fracture or dislocation. No marrow abnormality. Soft Tissues: Muscles are normal. Skin thickening and subcutaneous soft tissue edema about the elbow joint prominent about the lateral and posterior aspect without evidence of fluid collection or abscess. IMPRESSION: 1. Subcutaneous soft tissue edema and skin thickening suggesting cellulitis. No drainable fluid collection or abscess. 2.  Small knee joint effusion, likely reactive. 3. Marrow signal is within normal limits. No abnormal enhancement. No evidence of osteomyelitis. Electronically Signed   By: IKeane PoliceD.O.   On: 11/09/2021 23:46   [place]ABDOMEN LIMITED RUQ (LIVER/GB)  Result Date: 11/09/2021 CLINICAL DATA:  Abnormal LFTs EXAM: ULTRASOUND ABDOMEN LIMITED RIGHT UPPER QUADRANT COMPARISON:  PET scan 07/07/2021 FINDINGS: Gallbladder: Gallbladder is mildly distended to 4.5 cm. No pericholecystic fluid.  Gallbladder wall thickening. No gallstones. Negative sonographic Murphy's sign. Common bile duct: Diameter: Normal at 4 mm Liver: No focal lesion identified. Within normal limits in parenchymal echogenicity. Portal vein is patent on color Doppler imaging with normal direction of blood flow towards the liver. Other: None. IMPRESSION: 1. No evidence cholecystitis. 2. Normal liver. 3. No biliary duct dilatation Electronically Signed   By: SSuzy BouchardM.D.   On: 11/09/2021 16:07   CT EBLOW LEFT WWO CONTRAST  Result Date: 11/06/2021 CLINICAL DATA:  Left elbow swelling. Bone lesion, elbow, incidental. Febrile neutropenia. History of lymphoma. Evaluate for abscess or fluid. EXAM: CT OF THE UPPER LEFT EXTREMITY WITHOUT AND  WITH CONTRAST TECHNIQUE: Multidetector CT imaging of the left elbow was performed following the standard protocol before and during bolus administration of intravenous contrast. RADIATION DOSE REDUCTION: This exam was performed according to the departmental dose-optimization program which includes automated exposure control, adjustment of the mA and/or kV according to patient size and/or use of iterative reconstruction technique. COMPARISON:  No radiographs are available.  PET-CT 07/26/2021. CONTRAST:  152m OMNIPAQUE IOHEXOL 300 MG/ML  SOLN FINDINGS: Bones/Joint/Cartilage No evidence of acute fracture, dislocation or bone destruction. Mild spurring of the olecranon and coronoid processes. No erosive changes are identified. There is a moderate size joint effusion without apparent synovial enhancement. Previous T12 spinal augmentation and multilevel spondylosis are noted. Ligaments Suboptimally assessed by CT. Muscles and Tendons No intramuscular fluid collection or abnormal enhancement identified. The biceps and triceps tendons appear intact. Soft tissues Moderate dorsal subcutaneous edema without focal fluid collection or abnormal enhancement. No foreign body or soft tissue emphysema noted in  this area. There is a small amount of anterior subcutaneous emphysema which appears intravascular, likely related to an IV catheter. IMPRESSION: 1. Non focal subcutaneous edema posteriorly, suggesting soft tissue infection (cellulitis) with possible olecranon bursitis. 2. Nonspecific moderate-sized elbow joint effusion. Cannot exclude septic arthritis. 3. No evidence of soft tissue abscess, osteomyelitis or acute osseous abnormality. Electronically Signed   By: WRichardean SaleM.D.   On: 11/06/2021 12:14   DG Chest 2 View  Result Date: 11/05/2021 CLINICAL DATA:  Generalized weakness EXAM: CHEST - 2 VIEW COMPARISON:  08/13/2021 FINDINGS: Cardiac shadow is stable. Right chest wall port is stable. Linear scarring in the left base is again noted. No sizable effusion is seen. Changes of prior vertebral augmentation are noted. IMPRESSION: Left basilar scarring. Electronically Signed   By: MInez CatalinaM.D.   On: 11/05/2021 20:55      Subjective: Patient reports he feels good today and was hoping to be discharged home.  Discharge Exam: Vitals:   11/10/21 0432 11/10/21 0600  BP:  121/76  Pulse:  (!) 102  Resp:  (!) 24  Temp: 98 F (36.7 C) 98 F (36.7 C)  SpO2:  96%   Vitals:   11/09/21 2000 11/09/21 2100 11/10/21 0432 11/10/21 0600  BP:    121/76  Pulse:  95  (!) 102  Resp:  (!) 22  (!) 24  Temp: 98.4 F (36.9 C)  98 F (36.7 C) 98 F (36.7 C)  TempSrc: Oral  Oral Oral  SpO2:  96%  96%  Weight:      Height:        General: Pt is alert, awake, not in acute distress Cardiovascular: RRR, S1/S2 +, no rubs, no gallops Respiratory: CTA bilaterally, no wheezing, no rhonchi Abdominal: Soft, NT, ND, bowel sounds + Extremities: no edema, no cyanosis    The results of significant diagnostics from this hospitalization (including imaging, microbiology, ancillary and laboratory) are listed below for reference.     Microbiology: Recent Results (from the past 240 hour(s))  Culture, blood  (Routine x 2)     Status: None (Preliminary result)   Collection Time: 11/05/21  8:45 PM   Specimen: BLOOD  Result Value Ref Range Status   Specimen Description   Final    BLOOD BLOOD RIGHT HAND Performed at [facility] [address] [place] [address]   Special Requests   Final    BOTTLES DRAWN AEROBIC AND ANAEROBIC Blood Culture results may not be optimal due to an  inadequate volume of blood received in culture bottles Performed at [place] [address] [place], [place] [address]    Culture   Final    NO GROWTH 3 DAYS Performed at [facility], [place] [address] [place], [place] [address]    Report Status PENDING  Incomplete  Urine Culture     Status: Abnormal   Collection Time: 11/05/21  9:20 PM   Specimen: Urine, Catheterized  Result Value Ref Range Status   Specimen Description   Final    URINE, CATHETERIZED Performed at [place] [address], [place], [place] [address]    Special Requests   Final    NONE Performed at [facility], [place] [address], [place], [place] [address]    Culture (A)  Final    >=100,000 COLONIES/mL KLEBSIELLA ORNITHINOLYTICA 70,000 COLONIES/mL ENTEROCOCCUS FAECALIS    Report Status 11/09/2021 FINAL  Final   Organism ID, Bacteria KLEBSIELLA ORNITHINOLYTICA (A)  Final   Organism ID, Bacteria ENTEROCOCCUS FAECALIS (A)  Final      Susceptibility   Enterococcus faecalis - MIC*    AMPICILLIN <=2 SENSITIVE Sensitive     NITROFURANTOIN <=16 SENSITIVE Sensitive     VANCOMYCIN 1 SENSITIVE Sensitive     * 70,000 COLONIES/mL ENTEROCOCCUS FAECALIS   Klebsiella ornithinolytica - MIC*    AMPICILLIN >=32 RESISTANT Resistant     CEFAZOLIN >=64 RESISTANT Resistant     CEFEPIME >=32 RESISTANT Resistant     CEFTRIAXONE >=64 RESISTANT Resistant     CIPROFLOXACIN 1 RESISTANT Resistant     GENTAMICIN >=16 RESISTANT Resistant     IMIPENEM <=0.25 SENSITIVE  Sensitive     NITROFURANTOIN 128 RESISTANT Resistant     TRIMETH/SULFA >=320 RESISTANT Resistant     AMPICILLIN/SULBACTAM >=32 RESISTANT Resistant     PIP/TAZO 16 SENSITIVE Sensitive     * >=100,000 COLONIES/mL KLEBSIELLA ORNITHINOLYTICA  Culture, blood (Routine x 2)     Status: None (Preliminary result)   Collection Time: 11/05/21 10:39 PM   Specimen: BLOOD  Result Value Ref Range Status   Specimen Description   Final    BLOOD RIGHT PORTA CATH Performed at [facility], [place] [address] [place], [place] [address]    Special Requests   Final    BOTTLES DRAWN AEROBIC AND ANAEROBIC Blood Culture adequate volume Performed at [place] [address], [place], [place] [address]    Culture   Final    NO GROWTH 3 DAYS Performed at [facility], [place] [address] [place],  [address]    Report Status PENDING  Incomplete  Resp Panel by RT-PCR (Flu A&B, Covid) Anterior Nasal Swab     Status: Abnormal   Collection Time: 11/05/21 11:20 PM   Specimen: Anterior Nasal Swab  Result Value Ref Range Status   SARS Coronavirus 2 by RT PCR POSITIVE (A) NEGATIVE Final    Comment: (NOTE) SARS-CoV-2 target nucleic acids are DETECTED.  The SARS-CoV-2 RNA is generally detectable in upper respiratory specimens during the acute phase of infection. Positive results are indicative of the presence of the identified virus, but do not rule out bacterial infection or co-infection with other pathogens not detected by the test. Clinical correlation with patient history and other diagnostic information is necessary to determine patient infection status. The expected result is Negative.  Fact Sheet for Patients: EntrepreneurPulse.com.au  Fact Sheet for Healthcare Providers: IncredibleEmployment.be  This test is not yet approved or  cleared by the [place] and  has been authorized for detection and/or diagnosis of  SARS-CoV-2 by FDA under an Emergency Use Authorization (EUA).  This EUA will remain in effect (meaning this test can be used) for the duration of  the COVID-19 declaration under Section 564(b)(1) of the A ct, 21 U.S.C. section 360bbb-3(b)(1), unless the authorization is terminated or revoked sooner.     Influenza A by PCR NEGATIVE NEGATIVE Final   Influenza B by PCR NEGATIVE NEGATIVE Final    Comment: (NOTE) The Xpert Xpress SARS-CoV-2/FLU/RSV plus assay is intended as an aid in the diagnosis of influenza from Nasopharyngeal swab specimens and should not be used as a sole basis for treatment. Nasal washings and aspirates are unacceptable for Xpert Xpress SARS-CoV-2/FLU/RSV testing.  Fact Sheet for Patients: EntrepreneurPulse.com.au  Fact Sheet for Healthcare Providers: IncredibleEmployment.be  This test is not yet approved or cleared by the [place] FDA and has been authorized for detection and/or diagnosis of SARS-CoV-2 by FDA under an Emergency Use Authorization (EUA). This EUA will remain in effect (meaning this test can be used) for the duration of the COVID-19 declaration under Section 564(b)(1) of the Act, 21 U.S.C. section 360bbb-3(b)(1), unless the authorization is terminated or revoked.  Performed at [facility], [place] [address], [place], [place] [address]   MRSA Next Gen by PCR, Nasal     Status: None   Collection Time: 11/07/21 11:13 AM   Specimen: Nasal Mucosa; Nasal Swab  Result Value Ref Range Status   MRSA by PCR Next Gen NOT DETECTED NOT DETECTED Final    Comment: (NOTE) The GeneXpert MRSA Assay (FDA approved for NASAL specimens only), is one component of a comprehensive MRSA colonization surveillance program. It is not intended to diagnose MRSA infection nor to guide or monitor treatment for MRSA infections. Test performance is not FDA approved in patients less than 77 years old. Performed at  [facility], [place] [address] [place], [place] [address]      Labs: BNP (last 3 results) No results for input(s): "BNP" in the last 8760 hours. Basic Metabolic Panel: Recent Labs  Lab 11/06/21 0500 11/07/21 0434 11/08/21 0416 11/09/21 0408 11/10/21 0425  NA 135 133* 134* 133* 135  K 3.9 3.9 3.7 3.7 4.1  CL 105 101 100 98 100  CO2 '25 23 25 27 26  ' GLUCOSE 163* 161* 212* 137* 181*  BUN '16 15 13 12 14  ' CREATININE 0.64 0.50* 0.57* 0.48* 0.46*  CALCIUM 8.1* 8.0* 8.4* 8.4* 8.6*   Liver Function Tests: Recent Labs  Lab 11/06/21 0500 11/07/21 0434 11/08/21 0416 11/09/21 0408 11/10/21 0425  AST 12* 16 102* 102* 61*  ALT 22 22 99* 151* 124*  ALKPHOS 44 46 58 65 73  BILITOT 0.7 0.9 0.7 1.1 1.0  PROT 5.3* 5.9* 5.7* 6.3* 6.5  ALBUMIN 2.6* 2.8* 2.5* 2.9* 3.1*   No results for input(s): "LIPASE", "AMYLASE" in the last 168 hours. No results for input(s): "AMMONIA" in the last 168 hours. CBC: Recent Labs  Lab 11/05/21 2045 11/06/21 0500 11/07/21 0434 11/08/21 0416 11/08/21 1951 11/09/21 0408 11/10/21 0425  WBC 0.2* 0.2* 0.5* 1.1*  --  2.2* 3.0*  NEUTROABS 0.0*  --  0.3* 0.8*  --  1.6* 2.2  HGB 10.8* 8.8* 8.6* 7.7* 11.6* 11.6* 12.5*  HCT 31.6* 25.2* 24.5* 22.0* 32.3* 32.2* 35.3*  MCV 90.0 88.4 88.1 87.0  --  84.5 86.1  PLT 37* 20* 17* 23*  --  36* 51*   Cardiac Enzymes: No results for input(s): "CKTOTAL", "CKMB", "CKMBINDEX", "TROPONINI" in the last 168 hours. BNP: Invalid input(s): "POCBNP" CBG: Recent Labs  Lab 11/09/21 0718 11/09/21 1152 11/09/21 1703 11/09/21 2008 11/10/21 0728  GLUCAP 164* 203* 251* 241* 197*   D-Dimer No results for input(s): "DDIMER" in the last 72 hours. Hgb A1c No results for input(s): "HGBA1C" in the last 72 hours. Lipid Profile No results for input(s): "CHOL", "HDL", "LDLCALC", "TRIG", "CHOLHDL", "LDLDIRECT" in the last 72 hours. Thyroid function studies No results for input(s): "TSH", "T4TOTAL", "T3FREE",  "THYROIDAB" in the last 72 hours.  Invalid input(s): "FREET3" Anemia work up No results for input(s): "VITAMINB12", "FOLATE", "FERRITIN", "TIBC", "IRON", "RETICCTPCT" in the last 72 hours. Urinalysis    Component Value Date/Time   COLORURINE RED (A) 11/05/2021 2119   APPEARANCEUR CLOUDY (A) 11/05/2021 2119   LABSPEC 1.015 11/05/2021 2119   PHURINE  11/05/2021 2119    TEST NOT REPORTED DUE TO COLOR INTERFERENCE OF URINE PIGMENT   GLUCOSEU (A) 11/05/2021 2119    TEST NOT REPORTED DUE TO COLOR INTERFERENCE OF URINE PIGMENT   GLUCOSEU NEGATIVE 11/23/2011 0756   HGBUR (A) 11/05/2021 2119    TEST NOT REPORTED DUE TO COLOR INTERFERENCE OF URINE PIGMENT   BILIRUBINUR (A) 11/05/2021 2119    TEST NOT REPORTED DUE TO COLOR INTERFERENCE OF URINE PIGMENT   KETONESUR (A) 11/05/2021 2119    TEST NOT REPORTED DUE TO COLOR INTERFERENCE OF URINE PIGMENT   PROTEINUR (A) 11/05/2021 2119    TEST NOT REPORTED DUE TO COLOR INTERFERENCE OF URINE PIGMENT   UROBILINOGEN 0.2 11/23/2011 0756   NITRITE (A) 11/05/2021 2119    TEST NOT REPORTED DUE TO COLOR INTERFERENCE OF URINE PIGMENT   LEUKOCYTESUR (A) 11/05/2021 2119    TEST NOT REPORTED DUE TO COLOR INTERFERENCE OF URINE PIGMENT   Sepsis Labs Recent Labs  Lab 11/07/21 0434 11/08/21 0416 11/09/21 0408 11/10/21 0425  WBC 0.5* 1.1* 2.2* 3.0*   Microbiology Recent Results (from the past 240 hour(s))  Culture, blood (Routine x 2)     Status: None (Preliminary result)   Collection Time: 11/05/21  8:45 PM   Specimen: BLOOD  Result Value Ref Range Status   Specimen Description   Final    BLOOD BLOOD RIGHT HAND Performed at [facility], [place] [address] [place], [place] [address]    Special Requests   Final    BOTTLES DRAWN AEROBIC AND ANAEROBIC Blood Culture results may not be optimal due to an inadequate volume of blood received in culture bottles Performed at [place] [address], [place], [place]  [address]    Culture   Final    NO GROWTH 3 DAYS Performed at [facility], [place] [address], [place], [place] [address]    Report Status PENDING  Incomplete  Urine Culture     Status: Abnormal   Collection Time: 11/05/21  9:20 PM   Specimen: Urine, Catheterized  Result Value Ref Range Status   Specimen Description   Final    URINE, CATHETERIZED Performed at [place] [address], [place], [place] [address]    Special Requests   Final    NONE Performed at [facility], [place] [address] [place], [place] [address]    Culture (A)  Final    >=100,000 COLONIES/mL KLEBSIELLA ORNITHINOLYTICA 70,000 COLONIES/mL ENTEROCOCCUS FAECALIS    Report Status 11/09/2021 FINAL  Final   Organism ID,  Bacteria KLEBSIELLA ORNITHINOLYTICA (A)  Final   Organism ID, Bacteria ENTEROCOCCUS FAECALIS (A)  Final      Susceptibility   Enterococcus faecalis - MIC*    AMPICILLIN <=2 SENSITIVE Sensitive     NITROFURANTOIN <=16 SENSITIVE Sensitive     VANCOMYCIN 1 SENSITIVE Sensitive     * 70,000 COLONIES/mL ENTEROCOCCUS FAECALIS   Klebsiella ornithinolytica - MIC*    AMPICILLIN >=32 RESISTANT Resistant     CEFAZOLIN >=64 RESISTANT Resistant     CEFEPIME >=32 RESISTANT Resistant     CEFTRIAXONE >=64 RESISTANT Resistant     CIPROFLOXACIN 1 RESISTANT Resistant     GENTAMICIN >=16 RESISTANT Resistant     IMIPENEM <=0.25 SENSITIVE Sensitive     NITROFURANTOIN 128 RESISTANT Resistant     TRIMETH/SULFA >=320 RESISTANT Resistant     AMPICILLIN/SULBACTAM >=32 RESISTANT Resistant     PIP/TAZO 16 SENSITIVE Sensitive     * >=100,000 COLONIES/mL KLEBSIELLA ORNITHINOLYTICA  Culture, blood (Routine x 2)     Status: None (Preliminary result)   Collection Time: 11/05/21 10:39 PM   Specimen: BLOOD  Result Value Ref Range Status   Specimen Description   Final    BLOOD RIGHT PORTA CATH Performed at [facility] [address] [place], [place] [address]     Special Requests   Final    BOTTLES DRAWN AEROBIC AND ANAEROBIC Blood Culture adequate volume Performed at [facility] [address] [place], [place] [address]    Culture   Final    NO GROWTH 3 DAYS Performed at [facility], [place] [address], [place], [place] [address]    Report Status PENDING  Incomplete  Resp Panel by RT-PCR (Flu A&B, Covid) Anterior Nasal Swab     Status: Abnormal   Collection Time: 11/05/21 11:20 PM   Specimen: Anterior Nasal Swab  Result Value Ref Range Status   SARS Coronavirus 2 by RT PCR POSITIVE (A) NEGATIVE Final    Comment: (NOTE) SARS-CoV-2 target nucleic acids are DETECTED.  The SARS-CoV-2 RNA is generally detectable in upper respiratory specimens during the acute phase of infection. Positive results are indicative of the presence of the identified virus, but do not rule out bacterial infection or co-infection with other pathogens not detected by the test. Clinical correlation with patient history and other diagnostic information is necessary to determine patient infection status. The expected result is Negative.  Fact Sheet for Patients: EntrepreneurPulse.com.au  Fact Sheet for Healthcare Providers: IncredibleEmployment.be  This test is not yet approved or cleared by the [place] FDA and  has been authorized for detection and/or diagnosis of SARS-CoV-2 by FDA under an Emergency Use Authorization (EUA).  This EUA will remain in effect (meaning this test can be used) for the duration of  the COVID-19 declaration under Section 564(b)(1) of the A ct, 21 U.S.C. section 360bbb-3(b)(1), unless the authorization is terminated or revoked sooner.     Influenza A by PCR NEGATIVE NEGATIVE Final   Influenza B by PCR NEGATIVE NEGATIVE Final    Comment: (NOTE) The Xpert Xpress SARS-CoV-2/FLU/RSV plus assay is intended as an aid in the diagnosis of influenza from Nasopharyngeal swab  specimens and should not be used as a sole basis for treatment. Nasal washings and aspirates are unacceptable for Xpert Xpress SARS-CoV-2/FLU/RSV testing.  Fact Sheet for Patients: EntrepreneurPulse.com.au  Fact Sheet for Healthcare Providers: IncredibleEmployment.be  This test is not yet approved or cleared by the [place] and has been authorized for  detection and/or diagnosis of SARS-CoV-2 by FDA under an Emergency Use Authorization (EUA). This EUA will remain in effect (meaning this test can be used) for the duration of the COVID-19 declaration under Section 564(b)(1) of the Act, 21 U.S.C. section 360bbb-3(b)(1), unless the authorization is terminated or revoked.  Performed at [facility], [place] [address], [place], [place] [address]   MRSA Next Gen by PCR, Nasal     Status: None   Collection Time: 11/07/21 11:13 AM   Specimen: Nasal Mucosa; Nasal Swab  Result Value Ref Range Status   MRSA by PCR Next Gen NOT DETECTED NOT DETECTED Final    Comment: (NOTE) The GeneXpert MRSA Assay (FDA approved for NASAL specimens only), is one component of a comprehensive MRSA colonization surveillance program. It is not intended to diagnose MRSA infection nor to guide or monitor treatment for MRSA infections. Test performance is not FDA approved in patients less than 3 years old. Performed at [facility], [place] [address] [place], [place] [address]      Time coordinating discharge: Over 30 minutes  SIGNED:   Nicolette Bang, MD  [facility] 11/10/2021, 11:23 AM Pager   If 7PM-7AM, please contact night-coverage [email or web address] Password [email or web address]

## 2021-11-10 NOTE — Progress Notes (Signed)
[facility] Infectious Diseases Follow Up Note  Patient Identification: Patient Name: Aaron KEELING Sr. 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)   Pancytopenia (HCC)   Paroxysmal A-fib (HCC)   GERD (gastroesophageal reflux disease)   Chronic indwelling Foley catheter   Sepsis (HCC)   Antibiotics:  Vancomycin 7/3-7/4, Doxycycline 7/4-c Cefepime 7/1, meropenem 7/1-7/4, cefazolin 7/5-c Valacyclovir 7/2-c  Lines/Hardwares: RT chest port   Interval Events: Continues to remain afebrile, WBC and platelet count , LFTs improving  Assessment Left elbow cellulitis with small joint effusion-clinically improving - Attempted aspiration by Ortho 7/2 with dry tap and low suspicion for septic arthritis or olecranon bursitis -Reevaluated by Ortho, with some improvement in swelling.  MRI 7/5 subcutaneous soft tissue edema and skin thickening suggesting cellulitis.  No drainable fluid collection or abscess.  Small joint effusion likely reactive.  No evidence of osteomyelitis.  2.   Penicillin allergy - hives noted at age 15 with no other symptoms.  Tolerated p.o. amoxicillin challenge yesterday.  Will take off penicillin allergy from EMR   3.   Diffuse large cell non-Hodgkin's lymphoma ( Extranodal disease) s/p R-CHOP with last cycle 10/28/21  4.   Chronic Foley in the setting of urinary retention ( 7/1 urine cx with Klebsiella ornitholytica and E faecalis ) - no GU symptoms, likely  colonization and no indication to treat.  However he has already received at least 3 days of vancomycin and meropenem and should cover for acute cystitis.  5.   Pancytopenia: Improving 6.   Transaminitis -improving, [place] RUQ 7/5 unremarkable, acute hepatic panel and HIV negative in January 2023 6.   H/o MRSA bacteremia/Port-a-cath and axillary infection in January 2023   Recommendations Continue doxycycline  Switch cefazolin to cefadroxil Plan for 2 weeks course.  EOT 11/18/21 Agree with exchanging Foley's if possible ID will sign off for now.  Please call with questions  Rest of the management as per the primary team. Thank you for the consult.   ______________________________________________________________________ Subjective patient seen and examined at the bedside.  Left elbow pain and swelling is improving, still has restricted ROM of left elbow Denies cough, chest pain and SOB Denies GI and GU symptoms  Vitals BP 121/76 (BP Location: Right Arm)   Pulse (!) 102   Temp 98 F (36.7 C) (Oral)   Resp (!) 24   Ht '5\' 11"'$  (1.803 m)   Wt 72.6 kg   SpO2 96%   BMI 22.32 kg/m     Physical Exam Constitutional:  sitting up in the bed. Comfortable    Comments: no mucositis   Cardiovascular:     Rate and Rhythm: Normal rate and regular rhythm.     Heart sounds  Pulmonary:     Effort: Pulmonary effort is normal.     Comments: Normal breath sounds   Abdominal:     Palpations: Abdomen is soft.     Tenderness: non distended and non tender, chronic foley's  Musculoskeletal:        General: Left elbow with  mild redness and swelling (improving), cannot fully flex and extend left elbow . no swelling and tenderness in other peripheral joints   Skin:    Comments: No obvious rashes   Neurological:     General: awake, alert and oriented. Grossly non focal   Psychiatric:        Mood and Affect: Mood normal.   Pertinent Microbiology Results for orders placed or performed during  the hospital encounter of 11/05/21  Culture, blood (Routine x 2)     Status: None (Preliminary result)   Collection Time: 11/05/21  8:45 PM   Specimen: BLOOD  Result Value Ref Range Status   Specimen Description   Final    BLOOD BLOOD RIGHT HAND Performed at [facility] [address] [place], [place] [address]    Special Requests   Final    BOTTLES DRAWN AEROBIC AND ANAEROBIC Blood Culture results may not be optimal due to an inadequate volume of blood received in culture bottles Performed at [facility] [address] [place], [place] [address]    Culture   Final    NO GROWTH 3 DAYS Performed at [facility], [place] [address], [place], [place] [address]    Report Status PENDING  Incomplete  Urine Culture     Status: Abnormal   Collection Time: 11/05/21  9:20 PM   Specimen: Urine, Catheterized  Result Value Ref Range Status   Specimen Description   Final    URINE, CATHETERIZED Performed at [facility] [address] [place], [place] [address]    Special Requests   Final    NONE Performed at [facility], [place] [address] [place], [place] [address]    Culture (A)  Final    >=100,000 COLONIES/mL KLEBSIELLA ORNITHINOLYTICA 70,000 COLONIES/mL ENTEROCOCCUS FAECALIS    Report Status 11/09/2021 FINAL  Final   Organism ID, Bacteria KLEBSIELLA ORNITHINOLYTICA (A)  Final   Organism ID, Bacteria ENTEROCOCCUS FAECALIS (A)  Final      Susceptibility   Enterococcus faecalis - MIC*    AMPICILLIN <=2 SENSITIVE Sensitive     NITROFURANTOIN <=16 SENSITIVE Sensitive     VANCOMYCIN 1 SENSITIVE Sensitive     * 70,000 COLONIES/mL ENTEROCOCCUS FAECALIS   Klebsiella ornithinolytica - MIC*    AMPICILLIN >=32 RESISTANT Resistant     CEFAZOLIN >=64 RESISTANT Resistant     CEFEPIME >=32 RESISTANT Resistant     CEFTRIAXONE >=64 RESISTANT Resistant     CIPROFLOXACIN 1 RESISTANT Resistant     GENTAMICIN >=16  RESISTANT Resistant     IMIPENEM <=0.25 SENSITIVE Sensitive     NITROFURANTOIN 128 RESISTANT Resistant     TRIMETH/SULFA >=320 RESISTANT Resistant     AMPICILLIN/SULBACTAM >=32 RESISTANT Resistant     PIP/TAZO 16 SENSITIVE Sensitive     * >=100,000 COLONIES/mL KLEBSIELLA ORNITHINOLYTICA  Culture, blood (Routine x 2)     Status: None (Preliminary result)   Collection Time: 11/05/21 10:39 PM   Specimen: BLOOD  Result Value Ref Range Status   Specimen Description   Final    BLOOD RIGHT PORTA CATH Performed at [facility], [place] [address] [place], [place] [address]    Special Requests   Final    BOTTLES DRAWN AEROBIC AND ANAEROBIC Blood Culture adequate volume Performed at [facility] [address], [place],  [address]    Culture   Final    NO GROWTH 3 DAYS Performed at [facility], [place] [address], [place], [place] [address]    Report Status PENDING  Incomplete  Resp Panel by RT-PCR (Flu A&B, Covid) Anterior Nasal Swab     Status: Abnormal   Collection Time: 11/05/21 11:20 PM   Specimen: Anterior Nasal Swab  Result Value Ref Range Status   SARS Coronavirus 2 by RT PCR POSITIVE (A) NEGATIVE Final    Comment: (NOTE) SARS-CoV-2 target nucleic acids are DETECTED.  The SARS-CoV-2 RNA is generally detectable in upper respiratory specimens during the acute phase of infection. Positive results are indicative of the presence of the identified virus, but do not rule out bacterial infection or co-infection with other pathogens not detected by the test. Clinical correlation with patient history and other diagnostic information is necessary to determine patient infection status. The expected result is Negative.  Fact Sheet for Patients: EntrepreneurPulse.com.au  Fact Sheet for Healthcare Providers: IncredibleEmployment.be  This test is not yet approved or cleared by the [place] FDA and  has  been authorized for detection and/or diagnosis of SARS-CoV-2 by FDA under an Emergency Use Authorization (EUA).  This EUA will remain in effect (meaning this test can be used) for the duration of  the COVID-19 declaration under Section 564(b)(1) of the A ct, 21 U.S.C. section 360bbb-3(b)(1), unless the authorization is terminated or revoked sooner.     Influenza A by PCR NEGATIVE NEGATIVE Final   Influenza B by PCR NEGATIVE NEGATIVE Final    Comment: (NOTE) The Xpert Xpress SARS-CoV-2/FLU/RSV plus assay is intended as an aid in the diagnosis of influenza from Nasopharyngeal swab specimens and should not be used as a sole basis for treatment. Nasal washings and aspirates are unacceptable for Xpert Xpress SARS-CoV-2/FLU/RSV testing.  Fact Sheet for Patients: EntrepreneurPulse.com.au  Fact Sheet for Healthcare Providers: IncredibleEmployment.be  This test is not yet approved or cleared by the [place] FDA and has been authorized for detection and/or diagnosis of SARS-CoV-2 by FDA under an Emergency Use Authorization (EUA). This EUA will remain in effect (meaning this test can be used) for the duration of the COVID-19 declaration under Section 564(b)(1) of the Act, 21 U.S.C. section 360bbb-3(b)(1), unless the authorization is terminated or revoked.  Performed at [facility], [place] [address] [place], [place] [address]   MRSA Next Gen by PCR, Nasal     Status: None   Collection Time: 11/07/21 11:13 AM   Specimen: Nasal Mucosa; Nasal Swab  Result Value Ref Range Status   MRSA by PCR Next Gen NOT DETECTED NOT DETECTED Final    Comment: (NOTE) The GeneXpert MRSA Assay (FDA approved for NASAL specimens only), is one component of a comprehensive MRSA colonization surveillance program. It is not intended to diagnose MRSA infection nor to guide or monitor treatment for MRSA infections. Test performance is not FDA approved in  patients less than 70 years old. Performed at [facility], [place] [address], [place], [place] [address]     Pertinent Lab.    Latest Ref Rng & Units 11/10/2021    4:25 AM 11/09/2021    4:08 AM 11/08/2021    7:51 PM  CBC  WBC 4.0 - 10.5 K/uL 3.0  2.2    Hemoglobin 13.0 - 17.0 g/dL 12.5  11.6  11.6   Hematocrit 39.0 - 52.0 % 35.3  32.2  32.3   Platelets 150 - 400 K/uL 51  36        Latest Ref Rng & Units 11/10/2021  4:25 AM 11/09/2021    4:08 AM 11/08/2021    4:16 AM  CMP  Glucose 70 - 99 mg/dL 181  137  212   BUN 8 - 23 mg/dL '14  12  13   '$ Creatinine 0.61 - 1.24 mg/dL 0.46  0.48  0.57   Sodium 135 - 145 mmol/L 135  133  134   Potassium 3.5 - 5.1 mmol/L 4.1  3.7  3.7   Chloride 98 - 111 mmol/L 100  98  100   CO2 22 - 32 mmol/L '26  27  25   '$ Calcium 8.9 - 10.3 mg/dL 8.6  8.4  8.4   Total Protein 6.5 - 8.1 g/dL 6.5  6.3  5.7   Total Bilirubin 0.3 - 1.2 mg/dL 1.0  1.1  0.7   Alkaline Phos 38 - 126 U/L 73  65  58   AST 15 - 41 U/L 61  102  102   ALT 0 - 44 U/L 124  151  99      Pertinent Imaging today Plain films and CT images have been personally visualized and interpreted; radiology reports have been reviewed. Decision making incorporated into the Impression / Recommendations.  MR ELBOW LEFT W WO CONTRAST  Result Date: 11/09/2021 CLINICAL DATA:  Soft tissue infection suspected. EXAM: MRI OF THE LEFT ELBOW WITHOUT AND WITH CONTRAST TECHNIQUE: Multiplanar, multisequence MR imaging of the elbow was performed before and after the administration of intravenous contrast. CONTRAST:  63m GADAVIST GADOBUTROL 1 MMOL/ML IV SOLN COMPARISON:  CT examination dated November 06, 2021 FINDINGS: TENDONS Common forearm flexor origin: Intact Common forearm extensor origin: Intact Biceps: Intact Triceps: Intact LIGAMENTS Medial stabilizers: Intact Lateral stabilizers:  Intact Cartilage: No chondral defect. Joint: Small joint effusion with synovial enhancement on post-contrast sequences. Cubital  tunnel: Normal. Bones: No fracture or dislocation. No marrow abnormality. Soft Tissues: Muscles are normal. Skin thickening and subcutaneous soft tissue edema about the elbow joint prominent about the lateral and posterior aspect without evidence of fluid collection or abscess. IMPRESSION: 1. Subcutaneous soft tissue edema and skin thickening suggesting cellulitis. No drainable fluid collection or abscess. 2.  Small knee joint effusion, likely reactive. 3. Marrow signal is within normal limits. No abnormal enhancement. No evidence of osteomyelitis. Electronically Signed   By: IKeane PoliceD.O.   On: 11/09/2021 23:46   [place] LIMITED RUQ (LIVER/GB)  Result Date: 11/09/2021 CLINICAL DATA:  Abnormal LFTs EXAM: ULTRASOUND ABDOMEN LIMITED RIGHT UPPER QUADRANT COMPARISON:  PET scan 07/07/2021 FINDINGS: Gallbladder: Gallbladder is mildly distended to 4.5 cm. No pericholecystic fluid. Gallbladder wall thickening. No gallstones. Negative sonographic Murphy's sign. Common bile duct: Diameter: Normal at 4 mm Liver: No focal lesion identified. Within normal limits in parenchymal echogenicity. Portal vein is patent on color Doppler imaging with normal direction of blood flow towards the liver. Other: None. IMPRESSION: 1. No evidence cholecystitis. 2. Normal liver. 3. No biliary duct dilatation Electronically Signed   By: SSuzy BouchardM.D.   On: 11/09/2021 16:07   CT EBLOW LEFT WWO CONTRAST  Result Date: 11/06/2021 CLINICAL DATA:  Left elbow swelling. Bone lesion, elbow, incidental. Febrile neutropenia. History of lymphoma. Evaluate for abscess or fluid. EXAM: CT OF THE UPPER LEFT EXTREMITY WITHOUT AND WITH CONTRAST TECHNIQUE: Multidetector CT imaging of the left elbow was performed following the standard protocol before and during bolus administration of intravenous contrast. RADIATION DOSE REDUCTION: This exam was performed according to the departmental dose-optimization program which includes automated exposure  control, adjustment  of the mA and/or kV according to patient size and/or use of iterative reconstruction technique. COMPARISON:  No radiographs are available.  PET-CT 07/26/2021. CONTRAST:  113m OMNIPAQUE IOHEXOL 300 MG/ML  SOLN FINDINGS: Bones/Joint/Cartilage No evidence of acute fracture, dislocation or bone destruction. Mild spurring of the olecranon and coronoid processes. No erosive changes are identified. There is a moderate size joint effusion without apparent synovial enhancement. Previous T12 spinal augmentation and multilevel spondylosis are noted. Ligaments Suboptimally assessed by CT. Muscles and Tendons No intramuscular fluid collection or abnormal enhancement identified. The biceps and triceps tendons appear intact. Soft tissues Moderate dorsal subcutaneous edema without focal fluid collection or abnormal enhancement. No foreign body or soft tissue emphysema noted in this area. There is a small amount of anterior subcutaneous emphysema which appears intravascular, likely related to an IV catheter. IMPRESSION: 1. Non focal subcutaneous edema posteriorly, suggesting soft tissue infection (cellulitis) with possible olecranon bursitis. 2. Nonspecific moderate-sized elbow joint effusion. Cannot exclude septic arthritis. 3. No evidence of soft tissue abscess, osteomyelitis or acute osseous abnormality. Electronically Signed   By: WRichardean SaleM.D.   On: 11/06/2021 12:14   DG Chest 2 View  Result Date: 11/05/2021 CLINICAL DATA:  Generalized weakness EXAM: CHEST - 2 VIEW COMPARISON:  08/13/2021 FINDINGS: Cardiac shadow is stable. Right chest wall port is stable. Linear scarring in the left base is again noted. No sizable effusion is seen. Changes of prior vertebral augmentation are noted. IMPRESSION: Left basilar scarring. Electronically Signed   By: MInez CatalinaM.D.   On: 11/05/2021 20:55     I spent 51 minutes for this patient encounter including review of prior medical records, coordination of  care with primary/other specialist with greater than 50% of time being face to face/counseling and discussing diagnostics/treatment plan with the patient/family.  Electronically signed by:   SRosiland Oz MD Infectious Disease Physician [facility] Pager: [phone]

## 2021-11-11 ENCOUNTER — Other Ambulatory Visit: Payer: Self-pay

## 2021-11-11 DIAGNOSIS — C8338 Diffuse large B-cell lymphoma, lymph nodes of multiple sites: Secondary | ICD-10-CM

## 2021-11-11 LAB — CULTURE, BLOOD (ROUTINE X 2)
Culture: NO GROWTH
Culture: NO GROWTH
Special Requests: ADEQUATE

## 2021-11-14 ENCOUNTER — Inpatient Hospital Stay: Payer: Medicare Other

## 2021-11-14 ENCOUNTER — Inpatient Hospital Stay: Payer: Medicare Other | Attending: Physician Assistant

## 2021-11-14 VITALS — BP 104/70 | HR 85 | Temp 98.1°F | Resp 18

## 2021-11-14 DIAGNOSIS — Z794 Long term (current) use of insulin: Secondary | ICD-10-CM | POA: Insufficient documentation

## 2021-11-14 DIAGNOSIS — I714 Abdominal aortic aneurysm, without rupture, unspecified: Secondary | ICD-10-CM | POA: Insufficient documentation

## 2021-11-14 DIAGNOSIS — E1165 Type 2 diabetes mellitus with hyperglycemia: Secondary | ICD-10-CM | POA: Diagnosis not present

## 2021-11-14 DIAGNOSIS — M25552 Pain in left hip: Secondary | ICD-10-CM

## 2021-11-14 DIAGNOSIS — S2249XS Multiple fractures of ribs, unspecified side, sequela: Secondary | ICD-10-CM

## 2021-11-14 DIAGNOSIS — C8330 Diffuse large B-cell lymphoma, unspecified site: Secondary | ICD-10-CM | POA: Insufficient documentation

## 2021-11-14 DIAGNOSIS — C8338 Diffuse large B-cell lymphoma, lymph nodes of multiple sites: Secondary | ICD-10-CM

## 2021-11-14 LAB — CBC WITH DIFFERENTIAL (CANCER CENTER ONLY)
Abs Immature Granulocytes: 0.32 10*3/uL — ABNORMAL HIGH (ref 0.00–0.07)
Basophils Absolute: 0.1 10*3/uL (ref 0.0–0.1)
Basophils Relative: 1 %
Eosinophils Absolute: 0 10*3/uL (ref 0.0–0.5)
Eosinophils Relative: 0 %
HCT: 34 % — ABNORMAL LOW (ref 39.0–52.0)
Hemoglobin: 11.8 g/dL — ABNORMAL LOW (ref 13.0–17.0)
Immature Granulocytes: 3 %
Lymphocytes Relative: 6 %
Lymphs Abs: 0.5 10*3/uL — ABNORMAL LOW (ref 0.7–4.0)
MCH: 30.3 pg (ref 26.0–34.0)
MCHC: 34.7 g/dL (ref 30.0–36.0)
MCV: 87.2 fL (ref 80.0–100.0)
Monocytes Absolute: 1.1 10*3/uL — ABNORMAL HIGH (ref 0.1–1.0)
Monocytes Relative: 11 %
Neutro Abs: 7.9 10*3/uL — ABNORMAL HIGH (ref 1.7–7.7)
Neutrophils Relative %: 79 %
Platelet Count: 117 10*3/uL — ABNORMAL LOW (ref 150–400)
RBC: 3.9 MIL/uL — ABNORMAL LOW (ref 4.22–5.81)
RDW: 15.2 % (ref 11.5–15.5)
WBC Count: 9.9 10*3/uL (ref 4.0–10.5)
nRBC: 0 % (ref 0.0–0.2)

## 2021-11-14 LAB — SAMPLE TO BLOOD BANK

## 2021-11-14 LAB — CMP (CANCER CENTER ONLY)
ALT: 31 U/L (ref 0–44)
AST: 16 U/L (ref 15–41)
Albumin: 3.8 g/dL (ref 3.5–5.0)
Alkaline Phosphatase: 75 U/L (ref 38–126)
Anion gap: 10 (ref 5–15)
BUN: 26 mg/dL — ABNORMAL HIGH (ref 8–23)
CO2: 23 mmol/L (ref 22–32)
Calcium: 9.1 mg/dL (ref 8.9–10.3)
Chloride: 100 mmol/L (ref 98–111)
Creatinine: 0.69 mg/dL (ref 0.61–1.24)
GFR, Estimated: >60 mL/min (ref >60)
Glucose, Bld: 291 mg/dL — ABNORMAL HIGH (ref 70–99)
Potassium: 4.5 mmol/L (ref 3.5–5.1)
Sodium: 133 mmol/L — ABNORMAL LOW (ref 135–145)
Total Bilirubin: 0.7 mg/dL (ref 0.3–1.2)
Total Protein: 6.4 g/dL — ABNORMAL LOW (ref 6.5–8.1)

## 2021-11-14 MED ORDER — SODIUM CHLORIDE 0.9 % IV SOLN: Freq: Once | INTRAVENOUS | Status: AC

## 2021-11-14 MED ORDER — HEPARIN SOD (PORK) LOCK FLUSH 100 UNIT/ML IV SOLN
500.0000 [IU] | Freq: Once | INTRAVENOUS | Status: AC
  Administered 2021-11-14: 500 [IU] via INTRAVENOUS

## 2021-11-14 MED ORDER — SODIUM CHLORIDE 0.9% FLUSH
10.0000 mL | INTRAVENOUS | Status: DC | PRN
  Administered 2021-11-14: 10 mL via INTRAVENOUS

## 2021-11-14 NOTE — Patient Instructions (Signed)

## 2021-11-14 NOTE — Patient Instructions (Signed)

## 2021-11-20 ENCOUNTER — Other Ambulatory Visit: Payer: Self-pay | Admitting: Hematology & Oncology

## 2021-11-21 ENCOUNTER — Other Ambulatory Visit (HOSPITAL_COMMUNITY): Payer: Self-pay

## 2021-11-21 ENCOUNTER — Encounter: Payer: Self-pay | Admitting: Hematology & Oncology

## 2021-11-21 MED ORDER — FLUCONAZOLE 100 MG PO TABS
100.0000 mg | ORAL_TABLET | Freq: Every day | ORAL | 1 refills | Status: DC
  Filled 2021-11-21: qty 10, 10d supply, fill #0
  Filled 2021-12-30: qty 10, 10d supply, fill #1

## 2021-11-22 ENCOUNTER — Other Ambulatory Visit (HOSPITAL_COMMUNITY): Payer: Self-pay

## 2021-11-23 DIAGNOSIS — R338 Other retention of urine: Secondary | ICD-10-CM | POA: Diagnosis not present

## 2021-11-24 ENCOUNTER — Encounter (HOSPITAL_COMMUNITY): Payer: Self-pay

## 2021-11-25 ENCOUNTER — Encounter (HOSPITAL_COMMUNITY)
Admission: RE | Admit: 2021-11-25 | Discharge: 2021-11-25 | Disposition: A | Discharge status: Home / Self Care | Payer: Medicare Other | Source: Ambulatory Visit | Attending: Hematology & Oncology | Admitting: Hematology & Oncology

## 2021-11-25 DIAGNOSIS — C8338 Diffuse large B-cell lymphoma, lymph nodes of multiple sites: Secondary | ICD-10-CM

## 2021-11-25 DIAGNOSIS — C8512 Unspecified B-cell lymphoma, intrathoracic lymph nodes: Secondary | ICD-10-CM | POA: Diagnosis not present

## 2021-11-25 LAB — GLUCOSE, CAPILLARY: Glucose-Capillary: 224 mg/dL — ABNORMAL HIGH (ref 70–99)

## 2021-11-25 MED ORDER — FLUDEOXYGLUCOSE F - 18 (FDG) INJECTION
7.9600 | Freq: Once | INTRAVENOUS | Status: AC
  Administered 2021-11-25: 7.96 via INTRAVENOUS

## 2021-11-28 ENCOUNTER — Encounter: Payer: Self-pay | Admitting: Hematology & Oncology

## 2021-11-28 DIAGNOSIS — R338 Other retention of urine: Secondary | ICD-10-CM | POA: Diagnosis not present

## 2021-11-28 DIAGNOSIS — R8279 Other abnormal findings on microbiological examination of urine: Secondary | ICD-10-CM | POA: Diagnosis not present

## 2021-11-29 ENCOUNTER — Encounter: Payer: Self-pay | Admitting: *Deleted

## 2021-11-29 DIAGNOSIS — R339 Retention of urine, unspecified: Secondary | ICD-10-CM | POA: Diagnosis not present

## 2021-11-29 NOTE — Progress Notes (Signed)
PET shows continued response to treatment.   Oncology Nurse Navigator Documentation     11/29/2021    8:45 AM  Oncology Nurse Navigator Flowsheets  Navigator Follow Up Date: 12/02/2021  Navigator Follow Up Reason: Follow-up Appointment  Navigator Location [facility]  Navigator Encounter Type Appt/Treatment Plan Review  Patient Visit Type MedOnc  Treatment Phase Post-Tx Follow-up  Barriers/Navigation Needs Coordination of Care;Education  Interventions None Required  Acuity Level 2-Minimal Needs (1-2 Barriers Identified)  Support Groups/Services Friends and Family  Time Spent with Patient 15

## 2021-11-30 ENCOUNTER — Encounter: Payer: Self-pay | Admitting: *Deleted

## 2021-12-02 ENCOUNTER — Encounter: Payer: Self-pay | Admitting: Hematology & Oncology

## 2021-12-02 ENCOUNTER — Inpatient Hospital Stay: Payer: Medicare Other

## 2021-12-02 ENCOUNTER — Encounter: Payer: Self-pay | Admitting: *Deleted

## 2021-12-02 ENCOUNTER — Inpatient Hospital Stay (HOSPITAL_BASED_OUTPATIENT_CLINIC_OR_DEPARTMENT_OTHER): Payer: Medicare Other | Admitting: Hematology & Oncology

## 2021-12-02 VITALS — BP 133/79 | HR 73 | Temp 97.7°F | Resp 18 | Ht 71.0 in | Wt 170.0 lb

## 2021-12-02 DIAGNOSIS — C8338 Diffuse large B-cell lymphoma, lymph nodes of multiple sites: Secondary | ICD-10-CM | POA: Diagnosis not present

## 2021-12-02 DIAGNOSIS — I714 Abdominal aortic aneurysm, without rupture, unspecified: Secondary | ICD-10-CM | POA: Diagnosis not present

## 2021-12-02 DIAGNOSIS — C8330 Diffuse large B-cell lymphoma, unspecified site: Secondary | ICD-10-CM | POA: Diagnosis not present

## 2021-12-02 DIAGNOSIS — Z794 Long term (current) use of insulin: Secondary | ICD-10-CM | POA: Diagnosis not present

## 2021-12-02 DIAGNOSIS — E1165 Type 2 diabetes mellitus with hyperglycemia: Secondary | ICD-10-CM | POA: Diagnosis not present

## 2021-12-02 LAB — CMP (CANCER CENTER ONLY)
ALT: 17 U/L (ref 0–44)
AST: 19 U/L (ref 15–41)
Albumin: 4 g/dL (ref 3.5–5.0)
Alkaline Phosphatase: 51 U/L (ref 38–126)
Anion gap: 9 (ref 5–15)
BUN: 19 mg/dL (ref 8–23)
CO2: 26 mmol/L (ref 22–32)
Calcium: 9.8 mg/dL (ref 8.9–10.3)
Chloride: 102 mmol/L (ref 98–111)
Creatinine: 0.63 mg/dL (ref 0.61–1.24)
GFR, Estimated: >60 mL/min (ref >60)
Glucose, Bld: 222 mg/dL — ABNORMAL HIGH (ref 70–99)
Potassium: 4 mmol/L (ref 3.5–5.1)
Sodium: 137 mmol/L (ref 135–145)
Total Bilirubin: 0.6 mg/dL (ref 0.3–1.2)
Total Protein: 6.2 g/dL — ABNORMAL LOW (ref 6.5–8.1)

## 2021-12-02 LAB — CBC WITH DIFFERENTIAL (CANCER CENTER ONLY)
Abs Immature Granulocytes: 0.06 10*3/uL (ref 0.00–0.07)
Basophils Absolute: 0 10*3/uL (ref 0.0–0.1)
Basophils Relative: 1 %
Eosinophils Absolute: 0.1 10*3/uL (ref 0.0–0.5)
Eosinophils Relative: 1 %
HCT: 31.3 % — ABNORMAL LOW (ref 39.0–52.0)
Hemoglobin: 10.4 g/dL — ABNORMAL LOW (ref 13.0–17.0)
Immature Granulocytes: 1 %
Lymphocytes Relative: 15 %
Lymphs Abs: 0.7 10*3/uL (ref 0.7–4.0)
MCH: 31.6 pg (ref 26.0–34.0)
MCHC: 33.2 g/dL (ref 30.0–36.0)
MCV: 95.1 fL (ref 80.0–100.0)
Monocytes Absolute: 0.8 10*3/uL (ref 0.1–1.0)
Monocytes Relative: 17 %
Neutro Abs: 3.1 10*3/uL (ref 1.7–7.7)
Neutrophils Relative %: 65 %
Platelet Count: 120 10*3/uL — ABNORMAL LOW (ref 150–400)
RBC: 3.29 MIL/uL — ABNORMAL LOW (ref 4.22–5.81)
RDW: 20.1 % — ABNORMAL HIGH (ref 11.5–15.5)
WBC Count: 4.8 10*3/uL (ref 4.0–10.5)
nRBC: 0 % (ref 0.0–0.2)

## 2021-12-02 LAB — SAMPLE TO BLOOD BANK

## 2021-12-02 LAB — LACTATE DEHYDROGENASE: LDH: 179 U/L (ref 98–192)

## 2021-12-02 NOTE — Patient Instructions (Signed)

## 2021-12-02 NOTE — Progress Notes (Signed)
Patient is doing well. He looks much improved from when he was on treatment. He states he is slowly returning to baseline and is starting to feel good again.   He will need a PET in about 6 weeks. Will schedule closer to that date once auth is obtained.   Oncology Nurse Navigator Documentation     12/02/2021    9:00 AM  Oncology Nurse Navigator Flowsheets  Navigator Follow Up Date: 12/20/2021  Navigator Follow Up Reason: Radiology  Navigator Location [facility]  Navigator Encounter Type Follow-up Appt;Appt/Treatment Plan Review  Patient Visit Type MedOnc  Treatment Phase Post-Tx Follow-up  Barriers/Navigation Needs Coordination of Care;Education  Interventions Psycho-Social Support  Acuity Level 2-Minimal Needs (1-2 Barriers Identified)  Support Groups/Services Friends and Family  Time Spent with Patient 15

## 2021-12-02 NOTE — Progress Notes (Signed)
Hematology and Oncology Follow Up Visit  Aaron TAVELLA Sr. [id number] 17-Dec-1955 66 y.o. 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  He does look a lot better.  He feels a lot better.  He is out on his tractor.  He is able to do a lot more now.  His Foley catheter is now out.  Hopefully, he will do well having problems with urinating.  His blood counts are also looking better.  He has little bit of swelling in the ankles which I am sure is probably from the anemia.  We did do a PET scan on him.  This was done earlier this week.  The PET scan really did not show anything that was obvious.  There was some small level of activity down the inguinal area.  And I wonder if this may not be from his catheter that was indwelling.  Also noted on the PET scan was a little bit of activity in the renal hilum on the left side.  Again we will have to watch this.  I just do not think that he is can be a good candidate for any type of intrathecal therapy.  I realize that there is a risk of recurrence of his lymphoma.  I would probably repeat a PET scan in about 6 weeks so we can see if there is any change in the level of activity.  His appetite is improving.  He does have diabetes.  He is trying to watch his diabetes.  There is been no change in bowel habits.  Again the indwelling Foley catheter is out now.  He has had no problems with cough.  There is no increased shortness of breath.  Overall, his performance status is ECOG 1.    Medications:  Current Outpatient Medications:    acetaminophen (TYLENOL) 325 MG tablet, Take 2 tablets (650 mg total) by mouth every 6 (six) hours as needed for mild pain or headache., Disp: 120 tablet, Rfl: 0   albuterol (PROAIR HFA) 108 (90 Base) MCG/ACT inhaler, Inhale 1-2 puffs into the lungs  every 6 (six) hours as needed for wheezing or shortness of breath., Disp: 8 g, Rfl: 6   alendronate (FOSAMAX) 70 MG tablet, Take 70 mg by mouth every Sunday., Disp: , Rfl:    allopurinol (ZYLOPRIM) 100 MG tablet, Take 1 tablet (100 mg total) by mouth daily. Start 3 days BEFORE chemotherapy starts, Disp: 30 tablet, Rfl: 0   aspirin EC 81 MG EC tablet, Take 1 tablet (81 mg total) by mouth daily at 6 (six) AM. Swallow whole. (Patient taking differently: Take 81 mg by mouth in the morning. Swallow whole.), Disp: 30 tablet, Rfl: 11   atenolol (TENORMIN) 100 MG tablet, Take 100 mg by mouth daily., Disp: , Rfl:    blood glucose meter kit and supplies KIT, Dispense based on patient and insurance preference. Use up to four times daily as directed., Disp: 1 each, Rfl: 0   calcium carbonate (OSCAL) 1500 (600 Ca) MG TABS tablet, Take 600 mg of elemental calcium by mouth daily with breakfast., Disp: , Rfl:    Continuous Blood Gluc Receiver (FREESTYLE LIBRE 2 READER) DEVI, , Disp: , Rfl:    Continuous Blood Gluc Sensor (FREESTYLE LIBRE 14 DAY SENSOR) MISC, Inject 1 Device into  the skin every 14 (fourteen) days., Disp: , Rfl:    diclofenac Sodium (VOLTAREN) 1 % GEL, Apply 2 g topically daily as needed (to painful sites)., Disp: , Rfl:    DULoxetine (CYMBALTA) 60 MG capsule, Take 60 mg by mouth at bedtime., Disp: , Rfl:    famciclovir (FAMVIR) 250 MG tablet, Take 1 tablet (250 mg total) by mouth 2 (two) times daily., Disp: 30 tablet, Rfl: 8   Fish Oil OIL, Take 1,500 mg by mouth daily., Disp: , Rfl:    fluconazole (DIFLUCAN) 100 MG tablet, Take 1 tablet (100 mg total) by mouth daily., Disp: 10 tablet, Rfl: 1   insulin glargine (LANTUS) 100 UNIT/ML Solostar Pen, Inject 25 Units into the skin daily., Disp: 15 mL, Rfl: 11   insulin lispro (HUMALOG) 100 UNIT/ML KwikPen, Inject 2-15 Units into the skin 4 (four) times daily - after meals and at bedtime. Glucose 121 - 150: 2 units, Glucose 151 - 200: 3 units, Glucose 201 -  250: 5 units, Glucose 251 - 300: 8 units, Glucose 301 - 350: 11 units, Glucose 351 - 400: 15 units, Glucose > 400 call MD, Disp: 15 mL, Rfl: 11   Insulin Pen Needle 32G X 4 MM MISC, 1 each by Does not apply route 4 (four) times daily - after meals and at bedtime., Disp: 100 each, Rfl: 5   LORazepam (ATIVAN) 0.5 MG tablet, Take 1 tablet (0.5 mg total) by mouth every 8 (eight) hours. Take for nausea as needed, Disp: 30 tablet, Rfl: 1   Multiple Vitamin (MULTIVITAMIN ADULT PO), Take 1 tablet by mouth daily., Disp: , Rfl:    omeprazole (PRILOSEC) 40 MG capsule, Take 40 mg by mouth every evening., Disp: , Rfl:    potassium chloride SA (KLOR-CON M20) 20 MEQ tablet, TAKE 1 TABLET (20 MEQ TOTAL) BY MOUTH DAILY.  (Patient taking differently: Take 20 mEq by mouth in the morning.), Disp: 30 tablet, Rfl: 5   rosuvastatin (CRESTOR) 10 MG tablet, Take 10 mg by mouth daily., Disp: , Rfl:    tadalafil (CIALIS) 5 MG tablet, Take 5 mg by mouth in the morning., Disp: , Rfl:    tamsulosin (FLOMAX) 0.4 MG CAPS capsule, Take 0.4 mg by mouth every evening., Disp: , Rfl:    Zinc 50 MG TABS, Take 50 mg by mouth daily., Disp: , Rfl:   Allergies:  Allergies  Allergen Reactions   Fexofenadine Other (See Comments)    "dries me out too much"   Pravastatin Sodium Other (See Comments)    Aches   Requip [Ropinirole Hcl] Other (See Comments)    Bad dreams   Hydrochlorothiazide Other (See Comments)    Cramps    Past Medical History, Surgical history, Social history, and Family History were reviewed and updated.  Review of Systems: Review of Systems  Constitutional: Negative.   HENT:  Negative.    Eyes: Negative.   Respiratory: Negative.    Cardiovascular: Negative.   Gastrointestinal: Negative.   Endocrine: Negative.   Genitourinary: Negative.    Musculoskeletal: Negative.   Skin: Negative.   Neurological: Negative.   Hematological: Negative.   Psychiatric/Behavioral: Negative.      Physical Exam:  height  is '5\' 11"'  (1.803 m) and weight is 170 lb (77.1 kg). His oral temperature is 97.7 F (36.5 C). His blood pressure is 133/79 and his pulse is 73. His respiration is 18 and oxygen saturation is 99%.   Wt Readings from Last 3 Encounters:  12/02/21 170 lb (  77.1 kg)  11/05/21 160 lb (72.6 kg)  10/28/21 161 lb 6.4 oz (73.2 kg)  His vital signs show temperature of 98.6.  Pulse 72.  Blood pressure 103/62.  Weight is 180 pounds.  Physical Exam Vitals reviewed.  HENT:     Head: Normocephalic and atraumatic.  Eyes:     Pupils: Pupils are equal, round, and reactive to light.  Cardiovascular:     Rate and Rhythm: Normal rate and regular rhythm.     Heart sounds: Normal heart sounds.  Pulmonary:     Effort: Pulmonary effort is normal.     Breath sounds: Normal breath sounds.  Abdominal:     General: Bowel sounds are normal.     Palpations: Abdomen is soft.  Musculoskeletal:        General: No tenderness or deformity. Normal range of motion.     Cervical back: Normal range of motion.     Comments: Evaluation of her left elbow does show some fluctuance.  There is some erythema.  There is slight tenderness to palpation.  There is no exudate from the eschar.  He has decent range of motion of the elbow.  Lymphadenopathy:     Cervical: No cervical adenopathy.  Skin:    General: Skin is warm and dry.     Findings: No erythema or rash.  Neurological:     Mental Status: He is alert and oriented to person, place, and time.  Psychiatric:        Behavior: Behavior normal.        Thought Content: Thought content normal.        Judgment: Judgment normal.      Lab Results  Component Value Date   WBC 4.8 12/02/2021   HGB 10.4 (L) 12/02/2021   HCT 31.3 (L) 12/02/2021   MCV 95.1 12/02/2021   PLT 120 (L) 12/02/2021     Chemistry      Component Value Date/Time   NA 137 12/02/2021 0753   K 4.0 12/02/2021 0753   CL 102 12/02/2021 0753   CO2 26 12/02/2021 0753   BUN 19 12/02/2021 0753    CREATININE 0.63 12/02/2021 0753      Component Value Date/Time   CALCIUM 9.8 12/02/2021 0753   ALKPHOS 51 12/02/2021 0753   AST 19 12/02/2021 0753   ALT 17 12/02/2021 0753   BILITOT 0.6 12/02/2021 0753      Impression and Plan: Mr. Rhoda is a very nice 66 year old white male.  He has diffuse large cell non-Hodgkin's lymphoma.  He has extra nodal disease.  Again, he has a fairly high IPI score.  He has had 7 cycles of chemotherapy with R-CHOP.  Again, he had a very nice response to date.  However, it has been quite a struggle for him.    Again we will repeat the PET scan on him.  I will do this in 6 weeks.  I will then see what type of performance status he has.  I still have to worry about the possibility of CNS relapse.  Again, I am not sure how good of a candidate he is for CNS prophylaxis.  If there is problems with the PET scan, I think we might want to consider CAR-T therapy for him.  We will plan to get her back in about 2 months.  He will have his Port-A-Cath flush when we see him back.      [name], MD 7/28/20239:02 AM

## 2021-12-12 DIAGNOSIS — E1169 Type 2 diabetes mellitus with other specified complication: Secondary | ICD-10-CM | POA: Diagnosis not present

## 2021-12-12 DIAGNOSIS — M8580 Other specified disorders of bone density and structure, unspecified site: Secondary | ICD-10-CM | POA: Diagnosis not present

## 2021-12-12 DIAGNOSIS — D61810 Antineoplastic chemotherapy induced pancytopenia: Secondary | ICD-10-CM | POA: Diagnosis not present

## 2021-12-12 DIAGNOSIS — I480 Paroxysmal atrial fibrillation: Secondary | ICD-10-CM | POA: Diagnosis not present

## 2021-12-12 DIAGNOSIS — E7800 Pure hypercholesterolemia, unspecified: Secondary | ICD-10-CM | POA: Diagnosis not present

## 2021-12-12 DIAGNOSIS — J439 Emphysema, unspecified: Secondary | ICD-10-CM | POA: Diagnosis not present

## 2021-12-12 DIAGNOSIS — C8330 Diffuse large B-cell lymphoma, unspecified site: Secondary | ICD-10-CM | POA: Diagnosis not present

## 2021-12-12 DIAGNOSIS — I10 Essential (primary) hypertension: Secondary | ICD-10-CM | POA: Diagnosis not present

## 2021-12-12 DIAGNOSIS — I700 Atherosclerosis of aorta: Secondary | ICD-10-CM | POA: Diagnosis not present

## 2021-12-12 DIAGNOSIS — I77810 Thoracic aortic ectasia: Secondary | ICD-10-CM | POA: Diagnosis not present

## 2021-12-19 ENCOUNTER — Other Ambulatory Visit (HOSPITAL_BASED_OUTPATIENT_CLINIC_OR_DEPARTMENT_OTHER): Payer: Self-pay

## 2021-12-19 ENCOUNTER — Other Ambulatory Visit: Payer: Self-pay | Admitting: Hematology & Oncology

## 2021-12-19 MED ORDER — SULFAMETHOXAZOLE-TRIMETHOPRIM 800-160 MG PO TABS
1.0000 | ORAL_TABLET | Freq: Every day | ORAL | 4 refills | Status: DC
  Filled 2021-12-19: qty 20, 20d supply, fill #0
  Filled 2022-01-06: qty 20, 20d supply, fill #1

## 2021-12-21 ENCOUNTER — Encounter: Payer: Self-pay | Admitting: *Deleted

## 2021-12-21 NOTE — Progress Notes (Signed)
Patient needs a PET prior to his next appointment. Scheduled for 01/18/2022.  Patient is aware of appointment including date, time and location. He knows to be NPO except water after midnight and to hold all insulin that am. Radiology Information sheet also mailed to patient home with same information for education reinforcement.   Oncology Nurse Navigator Documentation     12/21/2021    2:15 PM  Oncology Nurse Navigator Flowsheets  Navigator Follow Up Date: 01/18/2022  Navigator Follow Up Reason: Scan Review  Navigator Location [facility]  Navigator Encounter Type Appt/Treatment Plan Review;Telephone  Telephone Appt Confirmation/Clarification;Education;Outgoing Call  Patient Visit Type MedOnc  Treatment Phase Post-Tx Follow-up  Barriers/Navigation Needs Coordination of Care;Education  Education Other  Interventions Coordination of Care;Education;Psycho-Social Support  Acuity Level 2-Minimal Needs (1-2 Barriers Identified)  Coordination of Care Radiology  Education Method Verbal;Written;Teach-back  Support Groups/Services Friends and Family  Time Spent with Patient 81

## 2021-12-23 NOTE — Progress Notes (Unsigned)
Office Visit    Patient Name: Aaron GABY Sr. Date of Encounter: 12/26/2021  PCP:  [name], MD   [place]  Cardiologist:  Candee Furbish, MD  Advanced Practice Provider:  No care team member to display Electrophysiologist:  None  HPI    Aaron FURNESS Sr. is a 66 y.o. male with a past medical history for hypertension, hyperlipidemia, diabetes mellitus, AAA status post EVAR, COPD, diffuse large B-cell lymphoma, aortic atherosclerosis, paroxysmal atrial fibrillation (occurring in the setting of acute illness BSA bacteremia in 1/23) presents today for 31-monthfollow-up.   He was last seen 06/14/2021 after his admission for MRSA bacteremia.  He had an axillary abscess which grew out MRSA.  His Port-A-Cath tip also grew out MRSA.  This was removed.  Hospitalization was complicated by AKI, incidental COVID and SIADH.  He had a brief run of atrial fibrillation with RVR.  He was placed on amiodarone with restoration of normal sinus rhythm.  Amiodarone was ultimately discontinued and he was placed back on his home beta-blocker dose.  A TEE was performed to rule out vegetation.  This suggested dilated aortic root at 46 mm.  His discharge notes indicated that his chest CTA was reviewed by radiology and his aortic root was felt to be 35 mm on that study.  When he returned to the office he was feeling better.  He endorsed chronic shortness of breath related to COPD.  That had been unchanged.  He denied any chest discomfort, syncope, orthopnea, leg edema.  He had not had any rapid palpitations.  Today, he feels pretty good without any palpitations.  He has not had any further syncope episodes.  He states that shortness of breath is a little bit better.  He was going through chemotherapy when he last saw Dr. SMarlou Porchand was having a really rough time with it.  He is now done with chemotherapy and is due for a repeat PET scan.  Otherwise, he feels like his energy is coming back  and is making an effort to do some walking.  He does endorse losing about 30 pounds through chemotherapy.  He states he is having some issues with his gout and he plans to call his primary care for a refill on his allopurinol.  Reports no chest pain, pressure, or tightness. No edema, orthopnea, PND. Reports no palpitations.    Past Medical History    Past Medical History:  Diagnosis Date   AAA (abdominal aortic aneurysm) ([facility]    Abscess    AKI (acute kidney injury) ([facility]    Allergic rhinitis    seasonal   Allergy    Anxiety state, unspecified    COPD (chronic obstructive pulmonary disease) ([facility]    Corns and callosities    toe   Cramp of limb    legs   Depression    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 ([facility] 05/04/2021   Dysrhythmia    GERD (gastroesophageal reflux disease)    Impacted cerumen    Impotence of organic origin    Lumbago    Lung nodule    MRSA bacteremia 05/31/2021   Multiple rib fractures 06/28/2021   Neutropenic fever ([facility] 08/13/2021   Other and unspecified hyperlipidemia    Routine general medical examination at a health care facility    Sepsis due to gram-negative UTI ([facility] 08/13/2021   Skipped heart beats    Syncope and collapse 06/28/2021   Tobacco  use disorder    Type II or unspecified type diabetes mellitus without mention of complication, not stated as uncontrolled    type II   Unspecified essential hypertension    Past Surgical History:  Procedure Laterality Date   ABDOMINAL AORTIC ENDOVASCULAR STENT GRAFT Bilateral 07/12/2020   Procedure: ABDOMINAL AORTIC ENDOVASCULAR STENT GRAFT;  Surgeon: [name], MD;  Location: [place];  Service: Vascular;  Laterality: Bilateral;   AXILLARY LYMPH NODE BIOPSY Right 04/27/2021   Procedure: AXILLARY EXCISIONAL LYMPH NODE BIOPSY;  Surgeon: Dwan Bolt, MD;  Location: [place];  Service: General;  Laterality: Right;   BACK SURGERY     fracture   CARPECTOMY  Right 09/11/2019   Procedure: PROXIMAL ROW CARPECTOMY; RADIAL STYLOIDECTOMY; POSTERIOR INTEROSSIUS NERVE RESECTION;  Surgeon: Daryll Brod, MD;  Location: [place];  Service: Orthopedics;  Laterality: Right;  AXILLARY BLOCK   COLONOSCOPY     INGUINAL HERNIA REPAIR     right   IR FLUORO GUIDE CV LINE RIGHT  06/16/2021   IR IMAGING GUIDED PORT INSERTION  07/07/2021   IR US GUIDE VASC ACCESS LEFT  06/16/2021   POLYPECTOMY     PORT-A-CATH REMOVAL N/A 06/01/2021   Procedure: REMOVAL PORT-A-CATH;  Surgeon: Armandina Gemma, MD;  Location: [facility];  Service: General;  Laterality: N/A;   PORTACATH PLACEMENT Right 05/13/2021   Procedure: INSERTION PORT-A-CATH;  Surgeon: Dwan Bolt, MD;  Location: [facility];  Service: General;  Laterality: Right;   TEE WITHOUT CARDIOVERSION N/A 06/06/2021   Procedure: TRANSESOPHAGEAL ECHOCARDIOGRAM (TEE);  Surgeon: Lelon Perla, MD;  [facility];  Service: Cardiovascular;  Laterality: N/A;   TONSILLECTOMY      Allergies  Allergies  Allergen Reactions   Fexofenadine Other (See Comments)    "dries me out too much"   Pravastatin Sodium Other (See Comments)    Aches   Requip [Ropinirole Hcl] Other (See Comments)    Bad dreams   Hydrochlorothiazide Other (See Comments)    Cramps    EKGs/Labs/Other Studies Reviewed:   The following studies were reviewed today: ZIO monitor 06/14/2021  Sinus rhythm average heart rate 82 bpm Occasional PACs, rare PVCs No atrial fibrillation, no pauses Brief atrial tachycardia runs. Benign. No electrocardiographic evidence/etiology of syncope.     Patch Wear Time:  13 days and 14 hours (2023-02-07T17:01:14-0500 to 2023-02-21T07:48:31-0500)   Patient had a min HR of 57 bpm, max HR of 194 bpm, and avg HR of 82 bpm. Predominant underlying rhythm was Sinus Rhythm. 1 run of Ventricular Tachycardia occurred lasting 4 beats with a max rate of 150 bpm (avg 102 bpm). 40 Supraventricular Tachycardia  runs occurred,  the run with the fastest interval lasting 4 beats with a max rate of 194 bpm, the longest lasting 12.0 secs with an avg rate of 113 bpm. Isolated SVEs were occasional (1.7%, 16109), SVE Couplets were rare (<1.0%, 537), and SVE Triplets  were rare (<1.0%, 73). Isolated VEs were rare (<1.0%, 2762), VE Couplets were rare (<1.0%, 14), and VE Triplets were rare (<1.0%, 1). Ventricular Bigeminy and Trigeminy were present.    Candee Furbish, MD  EKG:  EKG is not ordered today.    Recent Labs: 08/13/2021: TSH 0.786 08/17/2021: Magnesium 1.8 12/02/2021: ALT 17; BUN 19; Creatinine 0.63; Hemoglobin 10.4; Platelet Count 120; Potassium 4.0; Sodium 137  Recent Lipid Panel    Component Value Date/Time   CHOL 117 11/23/2011 0756   TRIG 85.0 11/23/2011 0756   HDL 35.10 (  L) 11/23/2011 0756   CHOLHDL 3 11/23/2011 0756   VLDL 17.0 11/23/2011 0756   LDLCALC 65 11/23/2011 0756   LDLDIRECT 73.2 07/19/2010 0757    Risk Assessment/Calculations:   CHA2DS2-VASc Score = 4   This indicates a 4.8% annual risk of stroke. The patient's score is based upon: CHF History: 0 HTN History: 1 Diabetes History: 1 Stroke History: 0 Vascular Disease History: 1 Age Score: 1 Gender Score: 0     Home Medications   Current Meds  Medication Sig   albuterol (PROAIR HFA) 108 (90 Base) MCG/ACT inhaler Inhale 1-2 puffs into the lungs every 6 (six) hours as needed for wheezing or shortness of breath.   alendronate (FOSAMAX) 70 MG tablet Take 70 mg by mouth every Sunday.   allopurinol (ZYLOPRIM) 100 MG tablet Take 1 tablet (100 mg total) by mouth daily. Start 3 days BEFORE chemotherapy starts   aspirin EC 81 MG EC tablet Take 1 tablet (81 mg total) by mouth daily at 6 (six) AM. Swallow whole. (Patient taking differently: Take 81 mg by mouth in the morning. Swallow whole.)   atenolol (TENORMIN) 100 MG tablet Take 100 mg by mouth daily.   blood glucose meter kit and supplies KIT Dispense based on patient and insurance preference.  Use up to four times daily as directed.   calcium carbonate (OSCAL) 1500 (600 Ca) MG TABS tablet Take 600 mg of elemental calcium by mouth daily with breakfast.   Continuous Blood Gluc Receiver (FREESTYLE LIBRE 2 READER) DEVI    Continuous Blood Gluc Sensor (FREESTYLE LIBRE 14 DAY SENSOR) MISC Inject 1 Device into the skin every 14 (fourteen) days.   diclofenac Sodium (VOLTAREN) 1 % GEL Apply 2 g topically daily as needed (to painful sites).   DULoxetine (CYMBALTA) 60 MG capsule Take 60 mg by mouth at bedtime.   famciclovir (FAMVIR) 250 MG tablet Take 1 tablet (250 mg total) by mouth 2 (two) times daily.   Fish Oil OIL Take 1,500 mg by mouth daily.   fluconazole (DIFLUCAN) 100 MG tablet Take 1 tablet (100 mg total) by mouth daily.   insulin glargine (LANTUS) 100 UNIT/ML Solostar Pen Inject 25 Units into the skin daily.   insulin lispro (HUMALOG) 100 UNIT/ML KwikPen Inject 2-15 Units into the skin 4 (four) times daily - after meals and at bedtime. Glucose 121 - 150: 2 units, Glucose 151 - 200: 3 units, Glucose 201 - 250: 5 units, Glucose 251 - 300: 8 units, Glucose 301 - 350: 11 units, Glucose 351 - 400: 15 units, Glucose > 400 call MD   Insulin Pen Needle 32G X 4 MM MISC 1 each by Does not apply route 4 (four) times daily - after meals and at bedtime.   LORazepam (ATIVAN) 0.5 MG tablet Take 1 tablet (0.5 mg total) by mouth every 8 (eight) hours. Take for nausea as needed   Multiple Vitamin (MULTIVITAMIN ADULT PO) Take 1 tablet by mouth daily.   omeprazole (PRILOSEC) 40 MG capsule Take 40 mg by mouth every evening.   potassium chloride SA (KLOR-CON M20) 20 MEQ tablet TAKE 1 TABLET (20 MEQ TOTAL) BY MOUTH DAILY.  (Patient taking differently: Take 20 mEq by mouth in the morning.)   rosuvastatin (CRESTOR) 10 MG tablet Take 10 mg by mouth daily.   sulfamethoxazole-trimethoprim (BACTRIM DS) 800-160 MG tablet Take 1 tablet by mouth daily.   tadalafil (CIALIS) 5 MG tablet Take 5 mg by mouth in the morning.    tamsulosin (FLOMAX) 0.4 MG  CAPS capsule Take 0.4 mg by mouth every evening.   Zinc 50 MG TABS Take 50 mg by mouth daily.     Review of Systems      All other systems reviewed and are otherwise negative except as noted above.  Physical Exam    VS:  BP 116/70 (BP Location: Right Arm, Patient Position: Sitting)   Pulse 82   Ht '5\' 11"'  (1.803 m)   Wt 170 lb (77.1 kg)   SpO2 96%   BMI 23.71 kg/m  , BMI Body mass index is 23.71 kg/m.  Wt Readings from Last 3 Encounters:  12/26/21 170 lb (77.1 kg)  12/02/21 170 lb (77.1 kg)  11/05/21 160 lb (72.6 kg)     GEN: Well nourished, well developed, in no acute distress. HEENT: normal. Neck: Supple, no JVD, carotid bruits, or masses. Cardiac: RRR, no murmurs, rubs, or gallops. No clubbing, cyanosis, edema.  Radials/PT 2+ and equal bilaterally.  Respiratory:  Respirations regular and unlabored, clear to auscultation bilaterally. GI: Soft, nontender, nondistended. MS: No deformity or atrophy. Skin: Warm and dry, no rash. Neuro:  Strength and sensation are intact. Psych: Normal affect.  Assessment & Plan    Paroxysmal atrial fibrillation -CHA2DS2-VASc score is 4 -ZIO monitor back in April 2023 was reassuring with no atrial fibrillation, no pauses.  Occasional PVCs and PACs.  Brief atrial tachycardia runs which are benign.  Average heart rate 82 bpm.  Reassuring study. -If he had recurrent atrial fibrillation he would need long-term anticoagulation -no palpitations  Essential hypertension -He states that it is well controlled at home -116/70 today -Continue current medication regimen  AAA -s/p EVAR  Hyperlipidemia -Dr. Alyson Ingles looks after his cholesterol level. LDL in goal when checked a few weeks ago. Triglycerides a little high. He is taking his fish oil, continue mediterranean diet.   Dilated aortic root -repeat echo in 06/2022 before follow-up         Disposition: Follow up  6 months  with Candee Furbish, MD or  APP.  Signed, Elgie Collard, PA-C 12/26/2021, 8:29 AM [facility]

## 2021-12-26 ENCOUNTER — Encounter: Payer: Self-pay | Admitting: Physician Assistant

## 2021-12-26 ENCOUNTER — Ambulatory Visit (INDEPENDENT_AMBULATORY_CARE_PROVIDER_SITE_OTHER): Payer: Medicare Other | Admitting: Physician Assistant

## 2021-12-26 VITALS — BP 116/70 | HR 82 | Ht 71.0 in | Wt 170.0 lb

## 2021-12-26 DIAGNOSIS — I480 Paroxysmal atrial fibrillation: Secondary | ICD-10-CM | POA: Diagnosis not present

## 2021-12-26 DIAGNOSIS — M10022 Idiopathic gout, left elbow: Secondary | ICD-10-CM | POA: Diagnosis not present

## 2021-12-26 DIAGNOSIS — I77810 Thoracic aortic ectasia: Secondary | ICD-10-CM | POA: Diagnosis not present

## 2021-12-26 DIAGNOSIS — I7143 Infrarenal abdominal aortic aneurysm, without rupture: Secondary | ICD-10-CM

## 2021-12-26 DIAGNOSIS — E782 Mixed hyperlipidemia: Secondary | ICD-10-CM | POA: Diagnosis not present

## 2021-12-26 DIAGNOSIS — I493 Ventricular premature depolarization: Secondary | ICD-10-CM

## 2021-12-26 NOTE — Patient Instructions (Signed)
Medication Instructions:  Your physician recommends that you continue on your current medications as directed. Please refer to the Current Medication list given to you today.  *If you need a refill on your cardiac medications before your next appointment, please call your pharmacy*   Lab Work: None If you have labs (blood work) drawn today and your tests are completely normal, you will receive your results only by: [facility] (if you have MyChart) OR A paper copy in the mail If you have any lab test that is abnormal or we need to change your treatment, we will call you to review the results.   Testing/Procedures: Your physician has requested that you have an echocardiogram prior to your 6 month follow up appointment. Echocardiography is a painless test that uses sound waves to create images of your heart. It provides your doctor with information about the size and shape of your heart and how well your heart's chambers and valves are working. This procedure takes approximately one hour. There are no restrictions for this procedure.    Follow-Up: At [facility], you and your health needs are our priority.  As part of our continuing mission to provide you with exceptional heart care, we have created designated Provider Care Teams.  These Care Teams include your primary Cardiologist (physician) and Advanced Practice Providers (APPs -  Physician Assistants and Nurse Practitioners) who all work together to provide you with the care you need, when you need it.  Your next appointment:   6 month(s)  The format for your next appointment:   In Person  Provider:   Candee Furbish, MD  or Nicholes Rough, PA-C     {  Important Information About Sugar

## 2021-12-27 DIAGNOSIS — R339 Retention of urine, unspecified: Secondary | ICD-10-CM | POA: Diagnosis not present

## 2021-12-30 ENCOUNTER — Other Ambulatory Visit: Payer: Self-pay | Admitting: Hematology & Oncology

## 2021-12-31 ENCOUNTER — Encounter: Payer: Self-pay | Admitting: Hematology & Oncology

## 2021-12-31 ENCOUNTER — Other Ambulatory Visit (HOSPITAL_COMMUNITY): Payer: Self-pay

## 2021-12-31 MED ORDER — ALLOPURINOL 100 MG PO TABS
100.0000 mg | ORAL_TABLET | Freq: Every day | ORAL | 0 refills | Status: DC
  Filled 2021-12-31: qty 30, 30d supply, fill #0

## 2022-01-03 ENCOUNTER — Other Ambulatory Visit (HOSPITAL_COMMUNITY): Payer: Self-pay

## 2022-01-04 ENCOUNTER — Other Ambulatory Visit (HOSPITAL_COMMUNITY): Payer: Self-pay

## 2022-01-06 ENCOUNTER — Encounter: Payer: Self-pay | Admitting: Hematology & Oncology

## 2022-01-06 ENCOUNTER — Other Ambulatory Visit (HOSPITAL_BASED_OUTPATIENT_CLINIC_OR_DEPARTMENT_OTHER): Payer: Self-pay

## 2022-01-08 DIAGNOSIS — M10022 Idiopathic gout, left elbow: Secondary | ICD-10-CM | POA: Diagnosis not present

## 2022-01-08 DIAGNOSIS — M25522 Pain in left elbow: Secondary | ICD-10-CM | POA: Diagnosis not present

## 2022-01-10 ENCOUNTER — Other Ambulatory Visit (HOSPITAL_BASED_OUTPATIENT_CLINIC_OR_DEPARTMENT_OTHER): Payer: Self-pay

## 2022-01-10 ENCOUNTER — Other Ambulatory Visit (HOSPITAL_COMMUNITY): Payer: Self-pay

## 2022-01-10 DIAGNOSIS — M7022 Olecranon bursitis, left elbow: Secondary | ICD-10-CM | POA: Diagnosis not present

## 2022-01-13 DIAGNOSIS — M7022 Olecranon bursitis, left elbow: Secondary | ICD-10-CM | POA: Diagnosis not present

## 2022-01-17 DIAGNOSIS — R339 Retention of urine, unspecified: Secondary | ICD-10-CM | POA: Diagnosis not present

## 2022-01-18 ENCOUNTER — Encounter (HOSPITAL_COMMUNITY)
Admission: RE | Admit: 2022-01-18 | Discharge: 2022-01-18 | Disposition: A | Discharge status: Home / Self Care | Payer: Medicare Other | Source: Ambulatory Visit | Attending: Hematology & Oncology | Admitting: Hematology & Oncology

## 2022-01-18 DIAGNOSIS — C8338 Diffuse large B-cell lymphoma, lymph nodes of multiple sites: Secondary | ICD-10-CM | POA: Insufficient documentation

## 2022-01-18 DIAGNOSIS — C8330 Diffuse large B-cell lymphoma, unspecified site: Secondary | ICD-10-CM | POA: Diagnosis not present

## 2022-01-18 LAB — GLUCOSE, CAPILLARY: Glucose-Capillary: 229 mg/dL — ABNORMAL HIGH (ref 70–99)

## 2022-01-18 MED ORDER — FLUDEOXYGLUCOSE F - 18 (FDG) INJECTION
8.5000 | Freq: Once | INTRAVENOUS | Status: AC | PRN
  Administered 2022-01-18: 8.5 via INTRAVENOUS

## 2022-01-19 ENCOUNTER — Telehealth: Payer: Self-pay | Admitting: *Deleted

## 2022-01-19 ENCOUNTER — Encounter: Payer: Self-pay | Admitting: *Deleted

## 2022-01-19 NOTE — Progress Notes (Signed)
Oncology Nurse Navigator Documentation     01/19/2022   10:45 AM  Oncology Nurse Navigator Flowsheets  Navigator Follow Up Date: 01/23/2022  Navigator Follow Up Reason: Follow-up Appointment  Navigator Location [facility]  Navigator Encounter Type Scan Review  Patient Visit Type MedOnc  Treatment Phase Post-Tx Follow-up  Barriers/Navigation Needs Coordination of Care;Education  Interventions None Required  Acuity Level 2-Minimal Needs (1-2 Barriers Identified)  Support Groups/Services Friends and Family  Time Spent with Patient 15

## 2022-01-19 NOTE — Telephone Encounter (Signed)
Pt notified per order of Dr. Marin Olp that the "PET scan looks fine without any evidence of recurrent lymphoma." Pt is appreciative of call and has no questions or concerns at this time.

## 2022-01-19 NOTE — Telephone Encounter (Signed)
-----   Message from [name], MD sent at 01/19/2022 12:09 PM EDT ----- Call him and let him know that the PET scan looks fine without any evidence of recurrent lymphoma.  Thanks.  Laurey Arrow

## 2022-01-23 ENCOUNTER — Inpatient Hospital Stay (HOSPITAL_BASED_OUTPATIENT_CLINIC_OR_DEPARTMENT_OTHER): Payer: Medicare Other | Admitting: Hematology & Oncology

## 2022-01-23 ENCOUNTER — Other Ambulatory Visit: Payer: Self-pay

## 2022-01-23 ENCOUNTER — Inpatient Hospital Stay: Payer: Medicare Other | Attending: Physician Assistant

## 2022-01-23 ENCOUNTER — Encounter: Payer: Self-pay | Admitting: Hematology & Oncology

## 2022-01-23 ENCOUNTER — Inpatient Hospital Stay: Payer: Medicare Other

## 2022-01-23 VITALS — BP 100/65 | HR 114 | Temp 98.5°F | Resp 18 | Ht 71.0 in | Wt 162.0 lb

## 2022-01-23 DIAGNOSIS — C8338 Diffuse large B-cell lymphoma, lymph nodes of multiple sites: Secondary | ICD-10-CM

## 2022-01-23 DIAGNOSIS — M7022 Olecranon bursitis, left elbow: Secondary | ICD-10-CM | POA: Diagnosis not present

## 2022-01-23 DIAGNOSIS — Z794 Long term (current) use of insulin: Secondary | ICD-10-CM | POA: Diagnosis not present

## 2022-01-23 DIAGNOSIS — C8330 Diffuse large B-cell lymphoma, unspecified site: Secondary | ICD-10-CM | POA: Diagnosis not present

## 2022-01-23 DIAGNOSIS — E1165 Type 2 diabetes mellitus with hyperglycemia: Secondary | ICD-10-CM | POA: Diagnosis not present

## 2022-01-23 DIAGNOSIS — M25552 Pain in left hip: Secondary | ICD-10-CM

## 2022-01-23 DIAGNOSIS — S2249XS Multiple fractures of ribs, unspecified side, sequela: Secondary | ICD-10-CM

## 2022-01-23 DIAGNOSIS — I714 Abdominal aortic aneurysm, without rupture, unspecified: Secondary | ICD-10-CM | POA: Diagnosis not present

## 2022-01-23 LAB — CMP (CANCER CENTER ONLY)
ALT: 27 U/L (ref 0–44)
AST: 25 U/L (ref 15–41)
Albumin: 3.9 g/dL (ref 3.5–5.0)
Alkaline Phosphatase: 82 U/L (ref 38–126)
Anion gap: 9 (ref 5–15)
BUN: 33 mg/dL — ABNORMAL HIGH (ref 8–23)
CO2: 22 mmol/L (ref 22–32)
Calcium: 8.5 mg/dL — ABNORMAL LOW (ref 8.9–10.3)
Chloride: 101 mmol/L (ref 98–111)
Creatinine: 1.01 mg/dL (ref 0.61–1.24)
GFR, Estimated: >60 mL/min (ref >60)
Glucose, Bld: 319 mg/dL — ABNORMAL HIGH (ref 70–99)
Potassium: 4.5 mmol/L (ref 3.5–5.1)
Sodium: 132 mmol/L — ABNORMAL LOW (ref 135–145)
Total Bilirubin: 0.5 mg/dL (ref 0.3–1.2)
Total Protein: 6.6 g/dL (ref 6.5–8.1)

## 2022-01-23 LAB — SAMPLE TO BLOOD BANK

## 2022-01-23 LAB — CBC WITH DIFFERENTIAL (CANCER CENTER ONLY)
Abs Immature Granulocytes: 0.02 10*3/uL (ref 0.00–0.07)
Basophils Absolute: 0.1 10*3/uL (ref 0.0–0.1)
Basophils Relative: 1 %
Eosinophils Absolute: 0 10*3/uL (ref 0.0–0.5)
Eosinophils Relative: 0 %
HCT: 37 % — ABNORMAL LOW (ref 39.0–52.0)
Hemoglobin: 12.7 g/dL — ABNORMAL LOW (ref 13.0–17.0)
Immature Granulocytes: 0 %
Lymphocytes Relative: 37 %
Lymphs Abs: 3 10*3/uL (ref 0.7–4.0)
MCH: 31.4 pg (ref 26.0–34.0)
MCHC: 34.3 g/dL (ref 30.0–36.0)
MCV: 91.6 fL (ref 80.0–100.0)
Monocytes Absolute: 0.6 10*3/uL (ref 0.1–1.0)
Monocytes Relative: 8 %
Neutro Abs: 4.3 10*3/uL (ref 1.7–7.7)
Neutrophils Relative %: 54 %
Platelet Count: 156 10*3/uL (ref 150–400)
RBC: 4.04 MIL/uL — ABNORMAL LOW (ref 4.22–5.81)
RDW: 13.6 % (ref 11.5–15.5)
WBC Count: 8.1 10*3/uL (ref 4.0–10.5)
nRBC: 0 % (ref 0.0–0.2)

## 2022-01-23 MED ORDER — HEPARIN SOD (PORK) LOCK FLUSH 100 UNIT/ML IV SOLN
500.0000 [IU] | Freq: Once | INTRAVENOUS | Status: AC | PRN
  Administered 2022-01-23: 500 [IU]

## 2022-01-23 MED ORDER — SODIUM CHLORIDE 0.9% FLUSH
10.0000 mL | Freq: Once | INTRAVENOUS | Status: AC | PRN
  Administered 2022-01-23: 10 mL

## 2022-01-23 NOTE — Patient Instructions (Signed)

## 2022-01-23 NOTE — Progress Notes (Signed)
Hematology and Oncology Follow Up Visit  ROMAINE NEVILLE Sr. [id number] Aug 07, 1955 66 y.o. 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 Jun is back for for follow-up.  He really is doing much better now.  He feels better.  His hemoglobin has improved.  He has had no problems with infections although he does have an infection with the left elbow.  He sees Orthopedic Surgery for this.  We did do a PET scan on him.  This was done on 01/18/2022.  There is no evidence of recurrent lymphoma on the PET scan.  He is at significant risk for recurrence.  Had a relatively high  IPI score.  As such, we are going to have to follow-up closely.  I just am not convinced that he is a good candidate for high-dose methotrexate for CNS prophylaxis.  I just think that this would be a little bit too much for him right now.  He has had no headache.  He has had no problems with cough or shortness of breath.  He has had no diarrhea.  He is urinating okay.  He does have the diabetes.  I do not have his blood sugar results back yet.  He has had no rashes.  He has had no bleeding.  Overall, I would say his performance status is probably ECOG 1.     Medications:  Current Outpatient Medications:    albuterol (PROAIR HFA) 108 (90 Base) MCG/ACT inhaler, Inhale 1-2 puffs into the lungs every 6 (six) hours as needed for wheezing or shortness of breath., Disp: 8 g, Rfl: 6   alendronate (FOSAMAX) 70 MG tablet, Take 70 mg by mouth every Sunday., Disp: , Rfl:    aspirin EC 81 MG EC tablet, Take 1 tablet (81 mg total) by mouth daily at 6 (six) AM. Swallow whole. (Patient taking differently: Take 81 mg by mouth in the morning. Swallow whole.), Disp: 30 tablet, Rfl: 11   blood glucose meter kit and supplies KIT, Dispense based on patient and insurance preference. Use up to four times  daily as directed., Disp: 1 each, Rfl: 0   calcium carbonate (OSCAL) 1500 (600 Ca) MG TABS tablet, Take 600 mg of elemental calcium by mouth daily with breakfast., Disp: , Rfl:    Continuous Blood Gluc Receiver (FREESTYLE LIBRE 2 READER) DEVI, , Disp: , Rfl:    Continuous Blood Gluc Sensor (FREESTYLE LIBRE 14 DAY SENSOR) MISC, Inject 1 Device into the skin every 14 (fourteen) days., Disp: , Rfl:    diclofenac Sodium (VOLTAREN) 1 % GEL, Apply 2 g topically daily as needed (to painful sites)., Disp: , Rfl:    doxycycline (VIBRAMYCIN) 100 MG capsule, Take 100 mg by mouth 2 (two) times daily., Disp: , Rfl:    DULoxetine (CYMBALTA) 60 MG capsule, Take 60 mg by mouth at bedtime., Disp: , Rfl:    famciclovir (FAMVIR) 250 MG tablet, Take 1 tablet (250 mg total) by mouth 2 (two) times daily., Disp: 30 tablet, Rfl: 8   Fish Oil OIL, Take 1,500 mg by mouth daily., Disp: , Rfl:    fluconazole (DIFLUCAN) 100 MG tablet, Take 1 tablet (100 mg total) by mouth daily., Disp: 10 tablet, Rfl: 1   insulin glargine (LANTUS) 100 UNIT/ML Solostar Pen, Inject 25 Units into the skin daily., Disp: 15 mL, Rfl:  11   insulin lispro (HUMALOG) 100 UNIT/ML KwikPen, Inject 2-15 Units into the skin 4 (four) times daily - after meals and at bedtime. Glucose 121 - 150: 2 units, Glucose 151 - 200: 3 units, Glucose 201 - 250: 5 units, Glucose 251 - 300: 8 units, Glucose 301 - 350: 11 units, Glucose 351 - 400: 15 units, Glucose > 400 call MD, Disp: 15 mL, Rfl: 11   Insulin Pen Needle 32G X 4 MM MISC, 1 each by Does not apply route 4 (four) times daily - after meals and at bedtime., Disp: 100 each, Rfl: 5   lisinopril (ZESTRIL) 20 MG tablet, Take 20 mg by mouth every morning., Disp: , Rfl:    LORazepam (ATIVAN) 0.5 MG tablet, Take 1 tablet (0.5 mg total) by mouth every 8 (eight) hours. Take for nausea as needed, Disp: 30 tablet, Rfl: 1   Multiple Vitamin (MULTIVITAMIN ADULT PO), Take 1 tablet by mouth daily., Disp: , Rfl:    omeprazole  (PRILOSEC) 40 MG capsule, Take 40 mg by mouth every evening., Disp: , Rfl:    potassium chloride SA (KLOR-CON M20) 20 MEQ tablet, TAKE 1 TABLET (20 MEQ TOTAL) BY MOUTH DAILY.  (Patient taking differently: Take 20 mEq by mouth in the morning.), Disp: 30 tablet, Rfl: 5   rosuvastatin (CRESTOR) 10 MG tablet, Take 10 mg by mouth daily., Disp: , Rfl:    sulfamethoxazole-trimethoprim (BACTRIM DS) 800-160 MG tablet, Take 1 tablet by mouth daily., Disp: 20 tablet, Rfl: 4   tadalafil (CIALIS) 5 MG tablet, Take 5 mg by mouth in the morning., Disp: , Rfl:    tamsulosin (FLOMAX) 0.4 MG CAPS capsule, Take 0.4 mg by mouth every evening., Disp: , Rfl:    Zinc 50 MG TABS, Take 50 mg by mouth daily., Disp: , Rfl:   Allergies:  Allergies  Allergen Reactions   Fexofenadine Other (See Comments)    "dries me out too much"   Pravastatin Sodium Other (See Comments)    Aches   Requip [Ropinirole Hcl] Other (See Comments)    Bad dreams   Hydrochlorothiazide Other (See Comments)    Cramps    Past Medical History, Surgical history, Social history, and Family History were reviewed and updated.  Review of Systems: Review of Systems  Constitutional: Negative.   HENT:  Negative.    Eyes: Negative.   Respiratory: Negative.    Cardiovascular: Negative.   Gastrointestinal: Negative.   Endocrine: Negative.   Genitourinary: Negative.    Musculoskeletal: Negative.   Skin: Negative.   Neurological: Negative.   Hematological: Negative.   Psychiatric/Behavioral: Negative.      Physical Exam:  height is '5\' 11"'  (1.803 m) and weight is 162 lb (73.5 kg). His oral temperature is 98.5 F (36.9 C). His blood pressure is 100/65 and his pulse is 114 (abnormal). His respiration is 18 and oxygen saturation is 100%.   Wt Readings from Last 3 Encounters:  01/23/22 162 lb (73.5 kg)  12/26/21 170 lb (77.1 kg)  12/02/21 170 lb (77.1 kg)  His vital signs show temperature of 98.6.  Pulse 72.  Blood pressure 103/62.  Weight is  180 pounds.  Physical Exam Vitals reviewed.  HENT:     Head: Normocephalic and atraumatic.  Eyes:     Pupils: Pupils are equal, round, and reactive to light.  Cardiovascular:     Rate and Rhythm: Normal rate and regular rhythm.     Heart sounds: Normal heart sounds.  Pulmonary:  Effort: Pulmonary effort is normal.     Breath sounds: Normal breath sounds.  Abdominal:     General: Bowel sounds are normal.     Palpations: Abdomen is soft.  Musculoskeletal:        General: No tenderness or deformity. Normal range of motion.     Cervical back: Normal range of motion.     Comments: Evaluation of her left elbow does show some fluctuance.  There is some erythema.  There is slight tenderness to palpation.  There is no exudate from the eschar.  He has decent range of motion of the elbow.  Lymphadenopathy:     Cervical: No cervical adenopathy.  Skin:    General: Skin is warm and dry.     Findings: No erythema or rash.  Neurological:     Mental Status: He is alert and oriented to person, place, and time.  Psychiatric:        Behavior: Behavior normal.        Thought Content: Thought content normal.        Judgment: Judgment normal.      Lab Results  Component Value Date   WBC 8.1 01/23/2022   HGB 12.7 (L) 01/23/2022   HCT 37.0 (L) 01/23/2022   MCV 91.6 01/23/2022   PLT 156 01/23/2022     Chemistry      Component Value Date/Time   NA 137 12/02/2021 0753   K 4.0 12/02/2021 0753   CL 102 12/02/2021 0753   CO2 26 12/02/2021 0753   BUN 19 12/02/2021 0753   CREATININE 0.63 12/02/2021 0753      Component Value Date/Time   CALCIUM 9.8 12/02/2021 0753   ALKPHOS 51 12/02/2021 0753   AST 19 12/02/2021 0753   ALT 17 12/02/2021 0753   BILITOT 0.6 12/02/2021 0753      Impression and Plan: Mr. Brodersen is a very nice 66 year old white male.  He has diffuse large cell non-Hodgkin's lymphoma.  He has extra nodal disease.  Again, he has a fairly high IPI score.  He has had 8 cycles  of chemotherapy with R-CHOP.  Again, he had a very nice response to date.  However, it has been quite a struggle for him.    I still think we will have to follow him with a PET scan.  I will have to get a PET scan on him in about 2 months.  I think this would be reasonable.  Again I am not totally ruled out using high-dose methotrexate for him.  I do think that we need to get him in a little bit better shape before I would consider this.  I am just happy that his quality life is better now.  I am glad that his hemoglobin is doing a whole lot better.  He still has his Port-A-Cath in.  As such, we will have to make sure we flushed this when we see him back.    [name], MD 9/18/20233:01 PM

## 2022-01-24 ENCOUNTER — Encounter: Payer: Self-pay | Admitting: Hematology & Oncology

## 2022-01-24 ENCOUNTER — Encounter: Payer: Self-pay | Admitting: *Deleted

## 2022-01-24 ENCOUNTER — Telehealth: Payer: Self-pay

## 2022-01-24 LAB — ERYTHROPOIETIN: Erythropoietin: 10 m[IU]/mL (ref 2.6–18.5)

## 2022-01-24 LAB — LACTATE DEHYDROGENASE: LDH: 88 U/L — ABNORMAL LOW (ref 98–192)

## 2022-01-24 NOTE — Progress Notes (Signed)
Patient will need a PET prior to his next appointment. Will schedule closer to that time and when authorization is obtained.  Oncology Nurse Navigator Documentation     01/24/2022    8:00 AM  Oncology Nurse Navigator Flowsheets  Navigator Follow Up Date: 02/20/2022  Navigator Follow Up Reason: Radiology  Navigator Location [facility]  Navigator Encounter Type Appt/Treatment Plan Review  Patient Visit Type MedOnc  Treatment Phase Post-Tx Follow-up  Barriers/Navigation Needs Coordination of Care;Education  Interventions None Required  Acuity Level 2-Minimal Needs (1-2 Barriers Identified)  Support Groups/Services Friends and Family  Time Spent with Patient 15

## 2022-01-24 NOTE — Telephone Encounter (Signed)
Pt wife called requesting [place] tro fax patients PET, last office note and labs to Dr.Dahlsteads office. Faxed per request.

## 2022-01-27 DIAGNOSIS — R000 Tachycardia, unspecified: Secondary | ICD-10-CM | POA: Diagnosis not present

## 2022-02-02 DIAGNOSIS — I10 Essential (primary) hypertension: Secondary | ICD-10-CM | POA: Diagnosis not present

## 2022-02-02 DIAGNOSIS — E1169 Type 2 diabetes mellitus with other specified complication: Secondary | ICD-10-CM | POA: Diagnosis not present

## 2022-02-02 DIAGNOSIS — K219 Gastro-esophageal reflux disease without esophagitis: Secondary | ICD-10-CM | POA: Diagnosis not present

## 2022-02-02 DIAGNOSIS — E7800 Pure hypercholesterolemia, unspecified: Secondary | ICD-10-CM | POA: Diagnosis not present

## 2022-02-07 DIAGNOSIS — E7800 Pure hypercholesterolemia, unspecified: Secondary | ICD-10-CM | POA: Diagnosis not present

## 2022-02-07 DIAGNOSIS — I10 Essential (primary) hypertension: Secondary | ICD-10-CM | POA: Diagnosis not present

## 2022-02-07 DIAGNOSIS — E1169 Type 2 diabetes mellitus with other specified complication: Secondary | ICD-10-CM | POA: Diagnosis not present

## 2022-02-07 DIAGNOSIS — J439 Emphysema, unspecified: Secondary | ICD-10-CM | POA: Diagnosis not present

## 2022-02-13 DIAGNOSIS — M25512 Pain in left shoulder: Secondary | ICD-10-CM | POA: Diagnosis not present

## 2022-02-13 DIAGNOSIS — M7022 Olecranon bursitis, left elbow: Secondary | ICD-10-CM | POA: Diagnosis not present

## 2022-02-21 ENCOUNTER — Encounter: Payer: Self-pay | Admitting: *Deleted

## 2022-02-21 NOTE — Progress Notes (Signed)
Patient needs a PET prior to his next appointment. Scheduled for 03/22/2022.  Patient is aware of PET appointment including date, time, and location. The following prep is reviewed with patient and confirmed with teachback: - arrive 30 minutes before appointment time - NPO except water for 6h before scan. No candy, no gum - hold any diabetic medication the morning of the scan, including insulin.  - have a low carb dinner the night prior Radiology Information sheet also mailed to patient's home for reinforcement of education.  Oncology Nurse Navigator Documentation     02/21/2022    8:45 AM  Oncology Nurse Navigator Flowsheets  Navigator Follow Up Date: 03/22/2022  Navigator Follow Up Reason: Scan Review  Navigator Location [facility]  Navigator Encounter Type Appt/Treatment Plan Review;Telephone  Telephone Appt Confirmation/Clarification;Education;Outgoing Call  Patient Visit Type MedOnc  Treatment Phase Post-Tx Follow-up  Barriers/Navigation Needs Coordination of Care;Education  Education Other  Interventions Coordination of Care;Education  Acuity Level 2-Minimal Needs (1-2 Barriers Identified)  Coordination of Care Radiology  Education Method Verbal;Written  Support Groups/Services Friends and Family  Time Spent with Patient 30

## 2022-03-01 DIAGNOSIS — M7022 Olecranon bursitis, left elbow: Secondary | ICD-10-CM | POA: Diagnosis not present

## 2022-03-08 DIAGNOSIS — R339 Retention of urine, unspecified: Secondary | ICD-10-CM | POA: Diagnosis not present

## 2022-03-15 DIAGNOSIS — M7022 Olecranon bursitis, left elbow: Secondary | ICD-10-CM | POA: Diagnosis not present

## 2022-03-22 ENCOUNTER — Encounter (HOSPITAL_COMMUNITY)
Admission: RE | Admit: 2022-03-22 | Discharge: 2022-03-22 | Disposition: A | Discharge status: Home / Self Care | Payer: Medicare Other | Source: Ambulatory Visit | Attending: Hematology & Oncology | Admitting: Hematology & Oncology

## 2022-03-22 DIAGNOSIS — M7022 Olecranon bursitis, left elbow: Secondary | ICD-10-CM | POA: Diagnosis not present

## 2022-03-22 DIAGNOSIS — C8338 Diffuse large B-cell lymphoma, lymph nodes of multiple sites: Secondary | ICD-10-CM | POA: Diagnosis not present

## 2022-03-22 DIAGNOSIS — C8590 Non-Hodgkin lymphoma, unspecified, unspecified site: Secondary | ICD-10-CM | POA: Diagnosis not present

## 2022-03-22 LAB — GLUCOSE, CAPILLARY: Glucose-Capillary: 218 mg/dL — ABNORMAL HIGH (ref 70–99)

## 2022-03-22 MED ORDER — FLUDEOXYGLUCOSE F - 18 (FDG) INJECTION
8.1000 | Freq: Once | INTRAVENOUS | Status: AC | PRN
  Administered 2022-03-22: 8.1 via INTRAVENOUS

## 2022-03-23 ENCOUNTER — Encounter: Payer: Self-pay | Admitting: *Deleted

## 2022-03-23 NOTE — Progress Notes (Signed)
Reviewed PET which shows continued response.  Oncology Nurse Navigator Documentation     03/23/2022    2:00 PM  Oncology Nurse Navigator Flowsheets  Navigator Follow Up Date: 03/28/2022  Navigator Follow Up Reason: Follow-up Appointment  Navigator Location [facility]  Navigator Encounter Type Scan Review  Patient Visit Type MedOnc  Treatment Phase Post-Tx Follow-up  Barriers/Navigation Needs Coordination of Care;Education  Interventions None Required  Acuity Level 2-Minimal Needs (1-2 Barriers Identified)  Support Groups/Services Friends and Family  Time Spent with Patient 15

## 2022-03-28 ENCOUNTER — Inpatient Hospital Stay: Payer: Medicare Other | Attending: Physician Assistant

## 2022-03-28 ENCOUNTER — Other Ambulatory Visit: Payer: Self-pay

## 2022-03-28 ENCOUNTER — Inpatient Hospital Stay (HOSPITAL_BASED_OUTPATIENT_CLINIC_OR_DEPARTMENT_OTHER): Payer: Medicare Other | Admitting: Hematology & Oncology

## 2022-03-28 ENCOUNTER — Inpatient Hospital Stay: Payer: Medicare Other

## 2022-03-28 ENCOUNTER — Encounter: Payer: Self-pay | Admitting: Hematology & Oncology

## 2022-03-28 ENCOUNTER — Encounter: Payer: Self-pay | Admitting: *Deleted

## 2022-03-28 VITALS — BP 90/58 | HR 46 | Temp 97.7°F | Resp 17 | Ht 71.0 in | Wt 178.0 lb

## 2022-03-28 DIAGNOSIS — C8338 Diffuse large B-cell lymphoma, lymph nodes of multiple sites: Secondary | ICD-10-CM

## 2022-03-28 DIAGNOSIS — S51002A Unspecified open wound of left elbow, initial encounter: Secondary | ICD-10-CM

## 2022-03-28 DIAGNOSIS — Z9221 Personal history of antineoplastic chemotherapy: Secondary | ICD-10-CM | POA: Diagnosis not present

## 2022-03-28 DIAGNOSIS — C8330 Diffuse large B-cell lymphoma, unspecified site: Secondary | ICD-10-CM | POA: Diagnosis not present

## 2022-03-28 LAB — FERRITIN: Ferritin: 571 ng/mL — ABNORMAL HIGH (ref 24–336)

## 2022-03-28 LAB — CMP (CANCER CENTER ONLY)
ALT: 21 U/L (ref 0–44)
AST: 17 U/L (ref 15–41)
Albumin: 4.4 g/dL (ref 3.5–5.0)
Alkaline Phosphatase: 55 U/L (ref 38–126)
Anion gap: 8 (ref 5–15)
BUN: 22 mg/dL (ref 8–23)
CO2: 26 mmol/L (ref 22–32)
Calcium: 8.9 mg/dL (ref 8.9–10.3)
Chloride: 101 mmol/L (ref 98–111)
Creatinine: 0.89 mg/dL (ref 0.61–1.24)
GFR, Estimated: >60 mL/min (ref >60)
Glucose, Bld: 370 mg/dL — ABNORMAL HIGH (ref 70–99)
Potassium: 4.5 mmol/L (ref 3.5–5.1)
Sodium: 135 mmol/L (ref 135–145)
Total Bilirubin: 0.8 mg/dL (ref 0.3–1.2)
Total Protein: 6.4 g/dL — ABNORMAL LOW (ref 6.5–8.1)

## 2022-03-28 LAB — CBC WITH DIFFERENTIAL (CANCER CENTER ONLY)
Abs Immature Granulocytes: 0.17 10*3/uL — ABNORMAL HIGH (ref 0.00–0.07)
Basophils Absolute: 0.1 10*3/uL (ref 0.0–0.1)
Basophils Relative: 1 %
Eosinophils Absolute: 0.1 10*3/uL (ref 0.0–0.5)
Eosinophils Relative: 1 %
HCT: 35.1 % — ABNORMAL LOW (ref 39.0–52.0)
Hemoglobin: 12.2 g/dL — ABNORMAL LOW (ref 13.0–17.0)
Immature Granulocytes: 2 %
Lymphocytes Relative: 24 %
Lymphs Abs: 1.8 10*3/uL (ref 0.7–4.0)
MCH: 32.1 pg (ref 26.0–34.0)
MCHC: 34.8 g/dL (ref 30.0–36.0)
MCV: 92.4 fL (ref 80.0–100.0)
Monocytes Absolute: 0.7 10*3/uL (ref 0.1–1.0)
Monocytes Relative: 8 %
Neutro Abs: 5 10*3/uL (ref 1.7–7.7)
Neutrophils Relative %: 64 %
Platelet Count: 118 10*3/uL — ABNORMAL LOW (ref 150–400)
RBC: 3.8 MIL/uL — ABNORMAL LOW (ref 4.22–5.81)
RDW: 15.1 % (ref 11.5–15.5)
WBC Count: 7.8 10*3/uL (ref 4.0–10.5)
nRBC: 0 % (ref 0.0–0.2)

## 2022-03-28 LAB — IRON AND IRON BINDING CAPACITY (CC-WL,HP ONLY)
Iron: 175 ug/dL (ref 45–182)
Saturation Ratios: 67 % — ABNORMAL HIGH (ref 17.9–39.5)
TIBC: 262 ug/dL (ref 250–450)
UIBC: 87 ug/dL — ABNORMAL LOW (ref 117–376)

## 2022-03-28 LAB — SAMPLE TO BLOOD BANK

## 2022-03-28 LAB — LACTATE DEHYDROGENASE: LDH: 153 U/L (ref 98–192)

## 2022-03-28 NOTE — Progress Notes (Signed)
Hematology and Oncology Follow Up Visit  Aaron VANCOTT Sr. [id number] 08/16/1955 66 y.o. 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  The 1 problem that he is having is that the wound on the left elbow still is not healing up.  He has been seen Orthopedic Surgery for this.  He had little bit of discharge from this wound today.  As such, we are culturing this.  I think part of the problem is that his blood sugars have been horrible.  His blood sugar today is 370.  I think with his blood sugars is high, it is hard for the wound to heal up.  Otherwise, he seems to be managing.  We did do a PET scan on him.  The PET scan was done on 03/22/2022.  The PET scan did not show any evidence of recurrent lymphoma.  He still feels little tired.  He has had no fever.  He has had no bleeding.  He has had no problem with bowels or bladder.  His pressure is on the low side.  He is on lisinopril and atenolol.  I do not think he needs the lisinopril right now.  I told him to go ahead and stop this.  I do not think this would be a problem.  Currently, I would have said that his performance status is probably ECOG 1.     Medications:  Current Outpatient Medications:    albuterol (PROAIR HFA) 108 (90 Base) MCG/ACT inhaler, Inhale 1-2 puffs into the lungs every 6 (six) hours as needed for wheezing or shortness of breath., Disp: 8 g, Rfl: 6   alendronate (FOSAMAX) 70 MG tablet, Take 70 mg by mouth every Sunday., Disp: , Rfl:    aspirin EC 81 MG EC tablet, Take 1 tablet (81 mg total) by mouth daily at 6 (six) AM. Swallow whole. (Patient taking differently: Take 81 mg by mouth in the morning. Swallow whole.), Disp: 30 tablet, Rfl: 11   blood glucose meter kit and supplies KIT, Dispense  based on patient and insurance preference. Use up to four times daily as directed., Disp: 1 each, Rfl: 0   calcium carbonate (OSCAL) 1500 (600 Ca) MG TABS tablet, Take 600 mg of elemental calcium by mouth daily with breakfast., Disp: , Rfl:    Continuous Blood Gluc Receiver (FREESTYLE LIBRE 2 READER) DEVI, , Disp: , Rfl:    Continuous Blood Gluc Sensor (FREESTYLE LIBRE 14 DAY SENSOR) MISC, Inject 1 Device into the skin every 14 (fourteen) days., Disp: , Rfl:    diclofenac Sodium (VOLTAREN) 1 % GEL, Apply 2 g topically daily as needed (to painful sites)., Disp: , Rfl:    doxycycline (VIBRAMYCIN) 100 MG capsule, Take 100 mg by mouth 2 (two) times daily., Disp: , Rfl:    DULoxetine (CYMBALTA) 60 MG capsule, Take 60 mg by mouth at bedtime., Disp: , Rfl:    famciclovir (FAMVIR) 250 MG tablet, Take 1 tablet (250 mg total) by mouth 2 (two) times daily., Disp: 30 tablet, Rfl: 8   Fish Oil OIL, Take 1,500 mg by mouth daily., Disp: , Rfl:    fluconazole (DIFLUCAN) 100 MG tablet, Take 1 tablet (100 mg total) by mouth daily., Disp: 10 tablet, Rfl:  1   insulin glargine (LANTUS) 100 UNIT/ML Solostar Pen, Inject 25 Units into the skin daily., Disp: 15 mL, Rfl: 11   insulin lispro (HUMALOG) 100 UNIT/ML KwikPen, Inject 2-15 Units into the skin 4 (four) times daily - after meals and at bedtime. Glucose 121 - 150: 2 units, Glucose 151 - 200: 3 units, Glucose 201 - 250: 5 units, Glucose 251 - 300: 8 units, Glucose 301 - 350: 11 units, Glucose 351 - 400: 15 units, Glucose > 400 call MD, Disp: 15 mL, Rfl: 11   Insulin Pen Needle 32G X 4 MM MISC, 1 each by Does not apply route 4 (four) times daily - after meals and at bedtime., Disp: 100 each, Rfl: 5   lisinopril (ZESTRIL) 20 MG tablet, Take 20 mg by mouth every morning., Disp: , Rfl:    LORazepam (ATIVAN) 0.5 MG tablet, Take 1 tablet (0.5 mg total) by mouth every 8 (eight) hours. Take for nausea as needed, Disp: 30 tablet, Rfl: 1   Multiple Vitamin (MULTIVITAMIN ADULT PO),  Take 1 tablet by mouth daily., Disp: , Rfl:    omeprazole (PRILOSEC) 40 MG capsule, Take 40 mg by mouth every evening., Disp: , Rfl:    potassium chloride SA (KLOR-CON M20) 20 MEQ tablet, TAKE 1 TABLET (20 MEQ TOTAL) BY MOUTH DAILY.  (Patient taking differently: Take 20 mEq by mouth in the morning.), Disp: 30 tablet, Rfl: 5   rosuvastatin (CRESTOR) 10 MG tablet, Take 10 mg by mouth daily., Disp: , Rfl:    sulfamethoxazole-trimethoprim (BACTRIM DS) 800-160 MG tablet, Take 1 tablet by mouth daily., Disp: 20 tablet, Rfl: 4   tadalafil (CIALIS) 5 MG tablet, Take 5 mg by mouth in the morning., Disp: , Rfl:    tamsulosin (FLOMAX) 0.4 MG CAPS capsule, Take 0.4 mg by mouth every evening., Disp: , Rfl:    Zinc 50 MG TABS, Take 50 mg by mouth daily., Disp: , Rfl:   Allergies:  Allergies  Allergen Reactions   Fexofenadine Other (See Comments)    "dries me out too much"   Pravastatin Sodium Other (See Comments)    Aches   Requip [Ropinirole Hcl] Other (See Comments)    Bad dreams   Hydrochlorothiazide Other (See Comments)    Cramps    Past Medical History, Surgical history, Social history, and Family History were reviewed and updated.  Review of Systems: Review of Systems  Constitutional: Negative.   HENT:  Negative.    Eyes: Negative.   Respiratory: Negative.    Cardiovascular: Negative.   Gastrointestinal: Negative.   Endocrine: Negative.   Genitourinary: Negative.    Musculoskeletal: Negative.   Skin: Negative.   Neurological: Negative.   Hematological: Negative.   Psychiatric/Behavioral: Negative.      Physical Exam:  height is _0  (1.803 m) and weight is 178 lb (80.7 kg). His oral temperature is 97.7 F (36.5 C). His blood pressure is 90/58 (abnormal) and his pulse is 46 (abnormal). His respiration is 17 and oxygen saturation is 100%.   Wt Readings from Last 3 Encounters:  03/28/22 178 lb (80.7 kg)  01/23/22 162 lb (73.5 kg)  12/26/21 170 lb (77.1 kg)  His vital signs  show temperature of 98.6.  Pulse 72.  Blood pressure 103/62.  Weight is 180 pounds.  Physical Exam Vitals reviewed.  HENT:     Head: Normocephalic and atraumatic.  Eyes:     Pupils: Pupils are equal, round, and reactive to light.  Cardiovascular:  Rate and Rhythm: Normal rate and regular rhythm.     Heart sounds: Normal heart sounds.  Pulmonary:     Effort: Pulmonary effort is normal.     Breath sounds: Normal breath sounds.  Abdominal:     General: Bowel sounds are normal.     Palpations: Abdomen is soft.  Musculoskeletal:        General: No tenderness or deformity. Normal range of motion.     Cervical back: Normal range of motion.     Comments: Evaluation of her left elbow does show some fluctuance.  There is some erythema.  There is slight tenderness to palpation.  There is no exudate from the eschar.  He has decent range of motion of the elbow.  Lymphadenopathy:     Cervical: No cervical adenopathy.  Skin:    General: Skin is warm and dry.     Findings: No erythema or rash.  Neurological:     Mental Status: He is alert and oriented to person, place, and time.  Psychiatric:        Behavior: Behavior normal.        Thought Content: Thought content normal.        Judgment: Judgment normal.      Lab Results  Component Value Date   WBC 7.8 03/28/2022   HGB 12.2 (L) 03/28/2022   HCT 35.1 (L) 03/28/2022   MCV 92.4 03/28/2022   PLT 118 (L) 03/28/2022     Chemistry      Component Value Date/Time   NA 135 03/28/2022 1124   K 4.5 03/28/2022 1124   CL 101 03/28/2022 1124   CO2 26 03/28/2022 1124   BUN 22 03/28/2022 1124   CREATININE 0.89 03/28/2022 1124      Component Value Date/Time   CALCIUM 8.9 03/28/2022 1124   ALKPHOS 55 03/28/2022 1124   AST 17 03/28/2022 1124   ALT 21 03/28/2022 1124   BILITOT 0.8 03/28/2022 1124      Impression and Plan: Aaron Weaver is a very nice 66 year old white male.  He has diffuse large cell non-Hodgkin's lymphoma.  He has extra  nodal disease.  Again, he has a fairly high IPI score.  He has had 8 cycles of chemotherapy with R-CHOP.    His PET scan does look quite good.  We will continue to follow him along.  I still am not sure that using high-dose methotrexate would be tolerable by him.  I do think that we probably need to put him on some Xgeva/Zometa every 3 months.    I do realize that there is a risk of recurrence for him.  There is a relatively high IPI score.  We will have to repeat another PET scan on him in February or March.  We will now plan to get him through the holidays.  Hopefully, this elbow will heal up.   [name], MD 11/21/20231:01 PM

## 2022-03-28 NOTE — Progress Notes (Unsigned)
Patient is doing very well after treatment. He looks great and states she is slowly returning to his baseline. PET will be needed after his next appointment.   Oncology Nurse Navigator Documentation     03/28/2022   12:30 PM  Oncology Nurse Navigator Flowsheets  Navigator Follow Up Date: 05/16/2022  Navigator Follow Up Reason: Follow-up Appointment  Navigator Location [facility]  Navigator Encounter Type Treatment;Appt/Treatment Plan Review  Patient Visit Type MedOnc  Treatment Phase Post-Tx Follow-up  Barriers/Navigation Needs Coordination of Care;Education  Interventions Psycho-Social Support  Acuity Level 2-Minimal Needs (1-2 Barriers Identified)  Support Groups/Services Friends and Family  Time Spent with Patient 15

## 2022-03-29 ENCOUNTER — Encounter: Payer: Self-pay | Admitting: *Deleted

## 2022-03-29 ENCOUNTER — Encounter: Payer: Self-pay | Admitting: Hematology & Oncology

## 2022-03-29 DIAGNOSIS — R338 Other retention of urine: Secondary | ICD-10-CM | POA: Diagnosis not present

## 2022-03-31 LAB — AEROBIC CULTURE W GRAM STAIN (SUPERFICIAL SPECIMEN)
Culture: NO GROWTH
Gram Stain: NONE SEEN

## 2022-05-16 ENCOUNTER — Encounter: Payer: Self-pay | Admitting: Hematology & Oncology

## 2022-05-16 ENCOUNTER — Inpatient Hospital Stay: Payer: Medicare Other | Attending: Physician Assistant

## 2022-05-16 ENCOUNTER — Other Ambulatory Visit: Payer: Self-pay

## 2022-05-16 ENCOUNTER — Inpatient Hospital Stay: Payer: Medicare Other

## 2022-05-16 ENCOUNTER — Inpatient Hospital Stay: Payer: Medicare Other | Admitting: Hematology & Oncology

## 2022-05-16 ENCOUNTER — Encounter: Payer: Self-pay | Admitting: *Deleted

## 2022-05-16 VITALS — BP 149/82 | HR 78 | Temp 97.7°F | Resp 16 | Ht 71.0 in | Wt 183.0 lb

## 2022-05-16 VITALS — BP 148/91 | HR 73 | Resp 1

## 2022-05-16 DIAGNOSIS — S2249XS Multiple fractures of ribs, unspecified side, sequela: Secondary | ICD-10-CM

## 2022-05-16 DIAGNOSIS — S51002A Unspecified open wound of left elbow, initial encounter: Secondary | ICD-10-CM

## 2022-05-16 DIAGNOSIS — M25552 Pain in left hip: Secondary | ICD-10-CM

## 2022-05-16 DIAGNOSIS — S51002D Unspecified open wound of left elbow, subsequent encounter: Secondary | ICD-10-CM | POA: Insufficient documentation

## 2022-05-16 DIAGNOSIS — C8330 Diffuse large B-cell lymphoma, unspecified site: Secondary | ICD-10-CM | POA: Insufficient documentation

## 2022-05-16 DIAGNOSIS — E119 Type 2 diabetes mellitus without complications: Secondary | ICD-10-CM | POA: Diagnosis not present

## 2022-05-16 DIAGNOSIS — C8338 Diffuse large B-cell lymphoma, lymph nodes of multiple sites: Secondary | ICD-10-CM

## 2022-05-16 DIAGNOSIS — Z9221 Personal history of antineoplastic chemotherapy: Secondary | ICD-10-CM | POA: Insufficient documentation

## 2022-05-16 LAB — CBC WITH DIFFERENTIAL (CANCER CENTER ONLY)
Abs Immature Granulocytes: 0.08 10*3/uL — ABNORMAL HIGH (ref 0.00–0.07)
Basophils Absolute: 0 10*3/uL (ref 0.0–0.1)
Basophils Relative: 1 %
Eosinophils Absolute: 0.1 10*3/uL (ref 0.0–0.5)
Eosinophils Relative: 1 %
HCT: 34.7 % — ABNORMAL LOW (ref 39.0–52.0)
Hemoglobin: 12.4 g/dL — ABNORMAL LOW (ref 13.0–17.0)
Immature Granulocytes: 2 %
Lymphocytes Relative: 25 %
Lymphs Abs: 1.2 10*3/uL (ref 0.7–4.0)
MCH: 33.5 pg (ref 26.0–34.0)
MCHC: 35.7 g/dL (ref 30.0–36.0)
MCV: 93.8 fL (ref 80.0–100.0)
Monocytes Absolute: 0.5 10*3/uL (ref 0.1–1.0)
Monocytes Relative: 10 %
Neutro Abs: 3 10*3/uL (ref 1.7–7.7)
Neutrophils Relative %: 61 %
Platelet Count: 129 10*3/uL — ABNORMAL LOW (ref 150–400)
RBC: 3.7 MIL/uL — ABNORMAL LOW (ref 4.22–5.81)
RDW: 13.5 % (ref 11.5–15.5)
WBC Count: 4.9 10*3/uL (ref 4.0–10.5)
nRBC: 0 % (ref 0.0–0.2)

## 2022-05-16 LAB — CMP (CANCER CENTER ONLY)
ALT: 15 U/L (ref 0–44)
AST: 13 U/L — ABNORMAL LOW (ref 15–41)
Albumin: 4.3 g/dL (ref 3.5–5.0)
Alkaline Phosphatase: 61 U/L (ref 38–126)
Anion gap: 8 (ref 5–15)
BUN: 23 mg/dL (ref 8–23)
CO2: 26 mmol/L (ref 22–32)
Calcium: 8.9 mg/dL (ref 8.9–10.3)
Chloride: 103 mmol/L (ref 98–111)
Creatinine: 0.8 mg/dL (ref 0.61–1.24)
GFR, Estimated: >60 mL/min (ref >60)
Glucose, Bld: 335 mg/dL — ABNORMAL HIGH (ref 70–99)
Potassium: 4.1 mmol/L (ref 3.5–5.1)
Sodium: 137 mmol/L (ref 135–145)
Total Bilirubin: 0.6 mg/dL (ref 0.3–1.2)
Total Protein: 6.8 g/dL (ref 6.5–8.1)

## 2022-05-16 LAB — LACTATE DEHYDROGENASE: LDH: 146 U/L (ref 98–192)

## 2022-05-16 MED ORDER — SODIUM CHLORIDE 0.9% FLUSH
3.0000 mL | Freq: Once | INTRAVENOUS | Status: AC | PRN
  Administered 2022-05-16: 10 mL

## 2022-05-16 MED ORDER — ZOLEDRONIC ACID 4 MG/100ML IV SOLN
4.0000 mg | Freq: Once | INTRAVENOUS | Status: AC
  Administered 2022-05-16: 4 mg via INTRAVENOUS
  Filled 2022-05-16: qty 100

## 2022-05-16 MED ORDER — SODIUM CHLORIDE 0.9 % IV SOLN: Freq: Once | INTRAVENOUS | Status: AC

## 2022-05-16 MED ORDER — HEPARIN SOD (PORK) LOCK FLUSH 100 UNIT/ML IV SOLN
250.0000 [IU] | Freq: Once | INTRAVENOUS | Status: AC | PRN
  Administered 2022-05-16: 500 [IU]

## 2022-05-16 NOTE — Patient Instructions (Signed)

## 2022-05-16 NOTE — Progress Notes (Signed)
Patient will need a PET prior to his next MD appointment.   Patient is aware of PET appointment including date, time, and location. The following prep is reviewed with patient and confirmed with teachback: - arrive 30 minutes before appointment time - NPO except water for 6h before scan. No candy, no gum - hold any diabetic medication the morning of the scan - have a low carb dinner the night prior Radiology Information sheet also reviewed with patient in treatment room for reinforcement of education.  Oncology Nurse Navigator Documentation     05/16/2022    8:45 AM  Oncology Nurse Navigator Flowsheets  Navigator Follow Up Date: 06/29/2022  Navigator Follow Up Reason: Scan Review  Navigator Location [facility]  Navigator Encounter Type Treatment;Appt/Treatment Plan Review  Patient Visit Type MedOnc  Treatment Phase Post-Tx Follow-up  Barriers/Navigation Needs Coordination of Care;Education  Education Other  Interventions Coordination of Care;Education;Psycho-Social Support  Acuity Level 2-Minimal Needs (1-2 Barriers Identified)  Coordination of Care Radiology  Education Method Verbal;Written  Support Groups/Services Friends and Family  Time Spent with Patient 30

## 2022-05-16 NOTE — Progress Notes (Addendum)
Hematology and Oncology Follow Up Visit  RAFIQ BUCKLIN Sr. [id number] October 22, 1955 67 y.o. 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 Leisinger is back for for follow-up.  The problem that he has is with his left elbow.  This has been a continual problem for him.  It looks like it is infected again.  He said every time he gets off of antibiotics, the elbow swells back up.  It sounds like she will be seeing a Orthopedic surgeon who might specialize in elbow joints.  Otherwise, he is doing okay.  We last saw him back in November.  He enjoyed the [place] season.  He still try to work on his blood sugars.  We do not have his blood sugar back yet.  He has had no problems with nausea or vomiting.  There is been no change in bowel or bladder habits.  He has had no fever.  He has had no cough or shortness of breath.  There is been no headache.  He has had no rashes.  There is been no leg swelling.  Overall, his performance status is probably ECOG 1.   Medications:  Current Outpatient Medications:    albuterol (PROAIR HFA) 108 (90 Base) MCG/ACT inhaler, Inhale 1-2 puffs into the lungs every 6 (six) hours as needed for wheezing or shortness of breath., Disp: 8 g, Rfl: 6   alendronate (FOSAMAX) 70 MG tablet, Take 70 mg by mouth every Sunday., Disp: , Rfl:    aspirin EC 81 MG EC tablet, Take 1 tablet (81 mg total) by mouth daily at 6 (six) AM. Swallow whole. (Patient taking differently: Take 81 mg by mouth in the morning. Swallow whole.), Disp: 30 tablet, Rfl: 11   blood glucose meter kit and supplies KIT, Dispense based on patient and insurance preference. Use up to four times daily as directed., Disp: 1 each, Rfl: 0   calcium carbonate (OSCAL) 1500 (600 Ca) MG TABS tablet, Take 600 mg of elemental calcium by  mouth daily with breakfast., Disp: , Rfl:    Continuous Blood Gluc Receiver (FREESTYLE LIBRE 2 READER) DEVI, , Disp: , Rfl:    Continuous Blood Gluc Sensor (FREESTYLE LIBRE 14 DAY SENSOR) MISC, Inject 1 Device into the skin every 14 (fourteen) days., Disp: , Rfl:    diclofenac Sodium (VOLTAREN) 1 % GEL, Apply 2 g topically daily as needed (to painful sites)., Disp: , Rfl:    doxycycline (VIBRAMYCIN) 100 MG capsule, Take 100 mg by mouth 2 (two) times daily., Disp: , Rfl:    DULoxetine (CYMBALTA) 60 MG capsule, Take 60 mg by mouth at bedtime., Disp: , Rfl:    famciclovir (FAMVIR) 250 MG tablet, Take 1 tablet (250 mg total) by mouth 2 (two) times daily., Disp: 30 tablet, Rfl: 8   Fish Oil OIL, Take 1,500 mg by mouth daily., Disp: , Rfl:    fluconazole (DIFLUCAN) 100 MG tablet, Take 1 tablet (100 mg total) by mouth daily., Disp: 10 tablet, Rfl: 1   insulin glargine (LANTUS) 100 UNIT/ML Solostar Pen, Inject 25 Units into the skin daily., Disp: 15 mL, Rfl: 11   insulin lispro (HUMALOG) 100 UNIT/ML KwikPen, Inject 2-15 Units into the skin 4 (four) times daily - after meals and at bedtime. Glucose 121 -  150: 2 units, Glucose 151 - 200: 3 units, Glucose 201 - 250: 5 units, Glucose 251 - 300: 8 units, Glucose 301 - 350: 11 units, Glucose 351 - 400: 15 units, Glucose > 400 call MD, Disp: 15 mL, Rfl: 11   Insulin Pen Needle 32G X 4 MM MISC, 1 each by Does not apply route 4 (four) times daily - after meals and at bedtime., Disp: 100 each, Rfl: 5   LORazepam (ATIVAN) 0.5 MG tablet, Take 1 tablet (0.5 mg total) by mouth every 8 (eight) hours. Take for nausea as needed, Disp: 30 tablet, Rfl: 1   Multiple Vitamin (MULTIVITAMIN ADULT PO), Take 1 tablet by mouth daily., Disp: , Rfl:    omeprazole (PRILOSEC) 40 MG capsule, Take 40 mg by mouth every evening., Disp: , Rfl:    potassium chloride SA (KLOR-CON M20) 20 MEQ tablet, TAKE 1 TABLET (20 MEQ TOTAL) BY MOUTH DAILY.  (Patient taking differently: Take 20 mEq by mouth  in the morning.), Disp: 30 tablet, Rfl: 5   rosuvastatin (CRESTOR) 10 MG tablet, Take 10 mg by mouth daily., Disp: , Rfl:    sulfamethoxazole-trimethoprim (BACTRIM DS) 800-160 MG tablet, Take 1 tablet by mouth daily., Disp: 20 tablet, Rfl: 4   tadalafil (CIALIS) 5 MG tablet, Take 5 mg by mouth in the morning., Disp: , Rfl:    tamsulosin (FLOMAX) 0.4 MG CAPS capsule, Take 0.4 mg by mouth every evening., Disp: , Rfl:    Zinc 50 MG TABS, Take 50 mg by mouth daily., Disp: , Rfl:   Allergies:  Allergies  Allergen Reactions   Fexofenadine Other (See Comments)    "dries me out too much"   Pravastatin Sodium Other (See Comments)    Aches   Requip [Ropinirole Hcl] Other (See Comments)    Bad dreams   Hydrochlorothiazide Other (See Comments)    Cramps    Past Medical History, Surgical history, Social history, and Family History were reviewed and updated.  Review of Systems: Review of Systems  Constitutional: Negative.   HENT:  Negative.    Eyes: Negative.   Respiratory: Negative.    Cardiovascular: Negative.   Gastrointestinal: Negative.   Endocrine: Negative.   Genitourinary: Negative.    Musculoskeletal: Negative.   Skin: Negative.   Neurological: Negative.   Hematological: Negative.   Psychiatric/Behavioral: Negative.      Physical Exam:  height is '5\' 11"'$  (1.803 m) and weight is 183 lb (83 kg). His oral temperature is 97.7 F (36.5 C). His blood pressure is 149/82 (abnormal) and his pulse is 78. His respiration is 16 and oxygen saturation is 99%.   Wt Readings from Last 3 Encounters:  05/16/22 183 lb (83 kg)  03/28/22 178 lb (80.7 kg)  01/23/22 162 lb (73.5 kg)  His vital signs show temperature of 98.6.  Pulse 72.  Blood pressure 103/62.  Weight is 180 pounds.  Physical Exam Vitals reviewed.  HENT:     Head: Normocephalic and atraumatic.  Eyes:     Pupils: Pupils are equal, round, and reactive to light.  Cardiovascular:     Rate and Rhythm: Normal rate and regular  rhythm.     Heart sounds: Normal heart sounds.  Pulmonary:     Effort: Pulmonary effort is normal.     Breath sounds: Normal breath sounds.  Abdominal:     General: Bowel sounds are normal.     Palpations: Abdomen is soft.  Musculoskeletal:        General: No tenderness  or deformity. Normal range of motion.     Cervical back: Normal range of motion.     Comments: Evaluation of the left elbow does show some fluctuance.  There is some erythema.  There is slight tenderness to palpation.  There is no exudate from the eschar.  He has decent range of motion of the elbow.  Lymphadenopathy:     Cervical: No cervical adenopathy.  Skin:    General: Skin is warm and dry.     Findings: No erythema or rash.  Neurological:     Mental Status: He is alert and oriented to person, place, and time.  Psychiatric:        Behavior: Behavior normal.        Thought Content: Thought content normal.        Judgment: Judgment normal.      Lab Results  Component Value Date   WBC 7.8 03/28/2022   HGB 12.2 (L) 03/28/2022   HCT 35.1 (L) 03/28/2022   MCV 92.4 03/28/2022   PLT 118 (L) 03/28/2022     Chemistry      Component Value Date/Time   NA 135 03/28/2022 1124   K 4.5 03/28/2022 1124   CL 101 03/28/2022 1124   CO2 26 03/28/2022 1124   BUN 22 03/28/2022 1124   CREATININE 0.89 03/28/2022 1124      Component Value Date/Time   CALCIUM 8.9 03/28/2022 1124   ALKPHOS 55 03/28/2022 1124   AST 17 03/28/2022 1124   ALT 21 03/28/2022 1124   BILITOT 0.8 03/28/2022 1124      Impression and Plan: Mr. Simenson is a very nice 67 year old white male.  He has diffuse large cell non-Hodgkin's lymphoma.  He has extra nodal disease.  Again, he has a fairly high IPI score.  He has had 8 cycles of chemotherapy with R-CHOP.    I really hate that this left elbow is causing problems.  Sounds like he is going need surgery for this.  He is due for his Zometa today.  I would see if we cannot give this to him.  I  really think this is important.  I do not see a problem with him if he does need to have surgery for the elbow.  I think diabetes will dictate whether or not he heals up.  We probably are due for another PET scan in February.  I will plan to see him back after the next PET scan is done.    [name], MD 1/9/20248:26 AM

## 2022-05-16 NOTE — Patient Instructions (Signed)

## 2022-05-18 DIAGNOSIS — M7022 Olecranon bursitis, left elbow: Secondary | ICD-10-CM | POA: Diagnosis not present

## 2022-05-18 DIAGNOSIS — M25512 Pain in left shoulder: Secondary | ICD-10-CM | POA: Diagnosis not present

## 2022-05-22 DIAGNOSIS — M7022 Olecranon bursitis, left elbow: Secondary | ICD-10-CM | POA: Diagnosis not present

## 2022-06-01 DIAGNOSIS — I10 Essential (primary) hypertension: Secondary | ICD-10-CM | POA: Diagnosis not present

## 2022-06-01 DIAGNOSIS — J439 Emphysema, unspecified: Secondary | ICD-10-CM | POA: Diagnosis not present

## 2022-06-01 DIAGNOSIS — I480 Paroxysmal atrial fibrillation: Secondary | ICD-10-CM | POA: Diagnosis not present

## 2022-06-01 DIAGNOSIS — E7800 Pure hypercholesterolemia, unspecified: Secondary | ICD-10-CM | POA: Diagnosis not present

## 2022-06-01 DIAGNOSIS — E1169 Type 2 diabetes mellitus with other specified complication: Secondary | ICD-10-CM | POA: Diagnosis not present

## 2022-06-05 ENCOUNTER — Other Ambulatory Visit: Payer: Self-pay | Admitting: *Deleted

## 2022-06-05 DIAGNOSIS — Z95828 Presence of other vascular implants and grafts: Secondary | ICD-10-CM

## 2022-06-09 ENCOUNTER — Telehealth: Payer: Self-pay | Admitting: Pharmacy Technician

## 2022-06-09 ENCOUNTER — Ambulatory Visit: Payer: Medicare Other | Admitting: Internal Medicine

## 2022-06-09 ENCOUNTER — Encounter: Payer: Self-pay | Admitting: Internal Medicine

## 2022-06-09 ENCOUNTER — Other Ambulatory Visit: Payer: Self-pay

## 2022-06-09 ENCOUNTER — Telehealth: Payer: Self-pay

## 2022-06-09 VITALS — BP 125/79 | HR 127 | Temp 97.9°F | Ht 72.0 in | Wt 180.0 lb

## 2022-06-09 DIAGNOSIS — M7022 Olecranon bursitis, left elbow: Secondary | ICD-10-CM

## 2022-06-09 NOTE — Telephone Encounter (Signed)
Per Dr. Linus Salmons, patient to completed 2 doses of dalvance '1500mg'$  IV one week apart. Patient should have CBC, CMP, CRP, and ESR completed at second infusion.   Jena Gauss, RN to assist with entering therapy plan. [employer] to complete PA and contact patient for scheduling.   Beryle Flock, RN

## 2022-06-09 NOTE — Progress Notes (Signed)
[facility] for Infectious Disease      Reason for Consult:recurrent olecranon bursitis    Referring Physician: Dr. Rhona Raider    Patient ID: Aaron Fee Sr., male    DOB: 09/30/1955, 67 y.o.   MRN: [id number]  HPI:   This is a new problem.   He has a history of MRSA bacteremia in January 2023 not long after starting chemotherapy for lymphoma and in March, had issues with olecranon bursitis.  He was seen by Dr. Marin Olp and given antibiotics and sent to Dr. Rhona Raider.  He was treated conservatively with antibiotics and has had multiple relapses with draining pus, then gets put on doxycycline and it resolves but returns within 1-2 weeks off of antibiotics.  This has been ongoing since March.  He had an MRI by Dr. Zachery Dakins in July that did not find any drainable fluid collection and has had not surgery.  At this time he has been off of antibiotics 2 weeks and it is doing well.    Past Medical History:  Diagnosis Date   AAA (abdominal aortic aneurysm) (HCC)    Abscess    AKI (acute kidney injury) ([place])    Allergic rhinitis    seasonal   Allergy    Anxiety state, unspecified    COPD (chronic obstructive pulmonary disease) (HCC)    Corns and callosities    toe   Cramp of limb    legs   Depression    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 ([place]) 05/04/2021   Dysrhythmia    GERD (gastroesophageal reflux disease)    Impacted cerumen    Impotence of organic origin    Lumbago    Lung nodule    MRSA bacteremia 05/31/2021   Multiple rib fractures 06/28/2021   Neutropenic fever ([place]) 08/13/2021   Other and unspecified hyperlipidemia    Routine general medical examination at a health care facility    Sepsis due to gram-negative UTI ([place]) 08/13/2021   Skipped heart beats    Syncope and collapse 06/28/2021   Tobacco use disorder    Type II or unspecified type diabetes mellitus without mention of complication, not stated as uncontrolled    type II   Unspecified  essential hypertension     Prior to Admission medications   Medication Sig Start Date End Date Taking? Authorizing Provider  albuterol (PROAIR HFA) 108 (90 Base) MCG/ACT inhaler Inhale 1-2 puffs into the lungs every 6 (six) hours as needed for wheezing or shortness of breath. 10/15/20   Collene Gobble, MD  alendronate (FOSAMAX) 70 MG tablet Take 70 mg by mouth every Sunday. 02/12/20   [provider]  aspirin EC 81 MG EC tablet Take 1 tablet (81 mg total) by mouth daily at 6 (six) AM. Swallow whole. Patient taking differently: Take 81 mg by mouth in the morning. Swallow whole. 07/14/20   Ulyses Amor, PA-C  blood glucose meter kit and supplies KIT Dispense based on patient and insurance preference. Use up to four times daily as directed. 06/08/21   Dwyane Dee, MD  calcium carbonate (OSCAL) 1500 (600 Ca) MG TABS tablet Take 600 mg of elemental calcium by mouth daily with breakfast.    [provider]  Continuous Blood Gluc Receiver (FREESTYLE LIBRE 2 READER) DEVI  06/16/21   [provider]  Continuous Blood Gluc Sensor (FREESTYLE LIBRE 14 DAY SENSOR) MISC Inject 1 Device into the skin every 14 (fourteen) days.  [provider]  diclofenac Sodium (VOLTAREN) 1 % GEL Apply 2 g topically daily as needed (to painful sites).    [provider]  doxycycline (VIBRAMYCIN) 100 MG capsule Take 100 mg by mouth 2 (two) times daily. 01/13/22   [provider]  DULoxetine (CYMBALTA) 60 MG capsule Take 60 mg by mouth at bedtime. 04/01/18   [provider]  famciclovir (FAMVIR) 250 MG tablet Take 1 tablet (250 mg total) by mouth 2 (two) times daily. 06/09/21   [name], MD  Fish Oil OIL Take 1,500 mg by mouth daily.    [provider]  fluconazole (DIFLUCAN) 100 MG tablet Take 1 tablet (100 mg total) by mouth daily. 11/21/21   [name], MD  insulin glargine (LANTUS) 100 UNIT/ML Solostar Pen Inject 25 Units into the skin daily.  06/30/21   Hosie Poisson, MD  insulin lispro (HUMALOG) 100 UNIT/ML KwikPen Inject 2-15 Units into the skin 4 (four) times daily - after meals and at bedtime. Glucose 121 - 150: 2 units, Glucose 151 - 200: 3 units, Glucose 201 - 250: 5 units, Glucose 251 - 300: 8 units, Glucose 301 - 350: 11 units, Glucose 351 - 400: 15 units, Glucose > 400 call MD 06/08/21   Dwyane Dee, MD  Insulin Pen Needle 32G X 4 MM MISC 1 each by Does not apply route 4 (four) times daily - after meals and at bedtime. 06/08/21   Dwyane Dee, MD  LORazepam (ATIVAN) 0.5 MG tablet Take 1 tablet (0.5 mg total) by mouth every 8 (eight) hours. Take for nausea as needed 11/01/21   [name], MD  Multiple Vitamin (MULTIVITAMIN ADULT PO) Take 1 tablet by mouth daily.    [provider]  omeprazole (PRILOSEC) 40 MG capsule Take 40 mg by mouth every evening.    [provider]  potassium chloride SA (KLOR-CON M20) 20 MEQ tablet TAKE 1 TABLET (20 MEQ TOTAL) BY MOUTH DAILY.  Patient taking differently: Take 20 mEq by mouth in the morning. 02/13/13   Plotnikov, Evie Lacks, MD  rosuvastatin (CRESTOR) 10 MG tablet Take 10 mg by mouth daily. 03/16/20   [provider]  sulfamethoxazole-trimethoprim (BACTRIM DS) 800-160 MG tablet Take 1 tablet by mouth daily. 12/19/21   [name], MD  tadalafil (CIALIS) 5 MG tablet Take 5 mg by mouth in the morning.    [provider]  tamsulosin (FLOMAX) 0.4 MG CAPS capsule Take 0.4 mg by mouth every evening.    [provider]  Zinc 50 MG TABS Take 50 mg by mouth daily.    [provider]  prochlorperazine (COMPAZINE) 10 MG tablet Take 1 tablet (10 mg total) by mouth every 6 (six) hours as needed (Nausea or vomiting). 05/17/21 11/04/21  [name], MD    Allergies  Allergen Reactions   Fexofenadine Other (See Comments)    "dries me out too much"   Pravastatin Sodium Other (See Comments)    Aches   Requip [Ropinirole Hcl] Other (See  Comments)    Bad dreams   Hydrochlorothiazide Other (See Comments)    Cramps    Social History   Tobacco Use   Smoking status: Former    Packs/day: 0.50    Years: 53.00    Total pack years: 26.50    Types: Cigarettes    Start date: 03/31/1965    Quit date: 07/22/2017    Years since quitting: 4.8   Smokeless tobacco: Never  Tobacco comments:    started at age 45.  Vaping Use   Vaping Use: Never used  Substance Use Topics   Alcohol use: Yes    Comment: rare beer once or twice   Drug use: No    Family History  Problem Relation Age of Onset   Colon cancer Mother 94   Hypertension Father    Coronary artery disease Other        male<60 and male<50  1st degree relative   Colon polyps Brother    Esophageal cancer Neg Hx    Rectal cancer Neg Hx    Stomach cancer Neg Hx     Review of Systems  Constitutional: negative for fevers and chills All other systems reviewed and are negative    Constitutional: in no apparent distress There were no vitals filed for this visit. EYES: anicteric Respiratory: normal respiratory effort Musculoskeletal: left elbow with a small opening with serosanguinous fluid, no warmth, no tenderness, no erythemna.    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  This has been ongoing for close to 1 year with no improvement.  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  Previously with MRSA bacteremia but unclear if this is the  organism.  At this point, since there is no known organism, I would like to treat him with a broad acting agent with dalbavancin x 2 doses and see if that is able to cure him.    Plan: 1)  dalbavancin 1500 mg x 1 and repeat dalbavancin 1500 mg IM 7 days later.   Follow up in 4 weeks, sooner if indicated

## 2022-06-09 NOTE — Telephone Encounter (Addendum)
Auth Submission: NO AUTH NEEDED Payer: [employer] MEDICARE Medication & CPT/J Code(s) submitted: Dalvance (Dalbavancin) [id number] Route of submission (phone, fax, portal):  Phone # Fax # Auth type: Buy/Bill Units/visits requested: 2 DOSES Reference number: [id number] Approval from:

## 2022-06-13 ENCOUNTER — Ambulatory Visit (INDEPENDENT_AMBULATORY_CARE_PROVIDER_SITE_OTHER): Payer: Medicare Other

## 2022-06-13 VITALS — BP 107/69 | HR 42 | Temp 97.7°F | Resp 18 | Ht 72.0 in | Wt 183.0 lb

## 2022-06-13 DIAGNOSIS — M7022 Olecranon bursitis, left elbow: Secondary | ICD-10-CM | POA: Diagnosis not present

## 2022-06-13 MED ORDER — DEXTROSE 5 % IV SOLN
1500.0000 mg | Freq: Once | INTRAVENOUS | Status: AC
  Administered 2022-06-13: 1500 mg via INTRAVENOUS
  Filled 2022-06-13: qty 75

## 2022-06-13 MED ORDER — HEPARIN SOD (PORK) LOCK FLUSH 100 UNIT/ML IV SOLN
500.0000 [IU] | Freq: Once | INTRAVENOUS | Status: AC | PRN
  Administered 2022-06-13: 500 [IU]
  Filled 2022-06-13: qty 5

## 2022-06-13 NOTE — Progress Notes (Signed)
Diagnosis: Olecranon bursitis of left elbow.  Provider:  Marshell Garfinkel MD  Procedure: Infusion  IV Type: Port a Cath, IV Location: R Chest  Dalvance, Dose: '1500mg'$   Infusion Start Time: 1358  Infusion Stop Time: 1438  Post Infusion IV Care: Observation period completed, Port flushed with D5W and Heparin locked per protocol.  Discharge: Condition: Good, Destination: Home . AVS provided to patient.   Performed by:  Arnoldo Morale, RN

## 2022-06-13 NOTE — Progress Notes (Deleted)
Diagnosis: Olecranon bursitis of left elbow.  Provider:  Marshell Garfinkel MD  Procedure: Infusion  IV Type: Port a Cath, IV Location: R Chest  Dalvance, Dose: '1500mg'$   Infusion Start Time: 1358  Infusion Stop Time: 1438  Post Infusion IV Care: Observation period completed and Peripheral IV Discontinued  Discharge: Condition: Good, Destination: Home . AVS provided to patient.   Performed by:  Arnoldo Morale, RN

## 2022-06-15 ENCOUNTER — Ambulatory Visit (HOSPITAL_COMMUNITY)
Admission: RE | Admit: 2022-06-15 | Discharge: 2022-06-15 | Disposition: A | Discharge status: Home / Self Care | Payer: Medicare Other | Source: Ambulatory Visit | Attending: Vascular Surgery | Admitting: Vascular Surgery

## 2022-06-15 ENCOUNTER — Ambulatory Visit: Payer: Medicare Other | Admitting: Physician Assistant

## 2022-06-15 VITALS — BP 104/71 | HR 47 | Temp 97.7°F | Resp 20 | Ht 72.0 in | Wt 180.9 lb

## 2022-06-15 DIAGNOSIS — Z95828 Presence of other vascular implants and grafts: Secondary | ICD-10-CM | POA: Insufficient documentation

## 2022-06-15 NOTE — Progress Notes (Signed)
HISTORY AND PHYSICAL     CC:  follow up. For EVAR Requesting Provider:  No ref. provider found  HPI: This is a 67 y.o. male who is here today for follow up for AAA and is s/p EVAR on  07/12/2020 by Dr. Carlis Abbott.  On 07/16/2020, he was readmitted to the hospital for burning in both thighs and hypotension.  He was doing well and discharged home the next day.   He was originally seen in 2022 at which time he had a screening ultrasound through his PCP Dr. Alyson Ingles given his tobacco abuse.  Aortic duplex on 03/21/2020 showed a 4.7 cm distal abdominal aortic aneurysm.  He denies any previous knowledge of this aneurysm.  States his dad did have an aneurysm and he did not have it repaired.  He smoked until about 2 years ago and then quit.  No significant abdominal pain at this time.  Does have history of chronic back pain that is at baseline.     Pt was last seen 03/01/2021 and at that time, CTA revealed stent graft was in excellent position without endoleak and aneurysm was excluded.    There was a question of lymphoproliferative process on his CT scan raised by radiology.  He was referred to hem/onc for further evaluation.  He was diagnosed with diffuse large cell non Hodgkins lymphoma.    The pt returns today for follow up studies. Pt is here with his wife of 62 years.  They celebrated their anniversary last week.  He states that he is currently in remission.  He is not having any issues.  He denies any claudication, rest pain or non healing wounds.  He is compliant with his asa/statin.    The pt is on a statin for cholesterol management.    The pt is on an aspirin.    Other AC:  none The pt is not on medication for hypertension.  The pt does  have diabetes. Tobacco hx:  former  Pt does  have family hx of AAA.  Past Medical History:  Diagnosis Date   AAA (abdominal aortic aneurysm) (HCC)    Abscess    AKI (acute kidney injury) ([place])    Allergic rhinitis    seasonal   Allergy    Anxiety state,  unspecified    COPD (chronic obstructive pulmonary disease) (HCC)    Corns and callosities    toe   Cramp of limb    legs   Depression    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 ([place]) 05/04/2021   Dysrhythmia    GERD (gastroesophageal reflux disease)    Impacted cerumen    Impotence of organic origin    Lumbago    Lung nodule    MRSA bacteremia 05/31/2021   Multiple rib fractures 06/28/2021   Neutropenic fever ([place]) 08/13/2021   Other and unspecified hyperlipidemia    Routine general medical examination at a health care facility    Sepsis due to gram-negative UTI ([place]) 08/13/2021   Skipped heart beats    Syncope and collapse 06/28/2021   Tobacco use disorder    Type II or unspecified type diabetes mellitus without mention of complication, not stated as uncontrolled    type II   Unspecified essential hypertension     Past Surgical History:  Procedure Laterality Date   ABDOMINAL AORTIC ENDOVASCULAR STENT GRAFT Bilateral 07/12/2020   Procedure: ABDOMINAL AORTIC ENDOVASCULAR STENT GRAFT;  Surgeon: [name], MD;  Location: [place];  Service: Vascular;  Laterality: Bilateral;   AXILLARY LYMPH NODE BIOPSY Right 04/27/2021   Procedure: AXILLARY EXCISIONAL LYMPH NODE BIOPSY;  Surgeon: Dwan Bolt, MD;  Location: [place];  Service: General;  Laterality: Right;   BACK SURGERY     fracture   CARPECTOMY Right 09/11/2019   Procedure: PROXIMAL ROW CARPECTOMY; RADIAL STYLOIDECTOMY; POSTERIOR INTEROSSIUS NERVE RESECTION;  Surgeon: Daryll Brod, MD;  Location: [place];  Service: Orthopedics;  Laterality: Right;  AXILLARY BLOCK   COLONOSCOPY     INGUINAL HERNIA REPAIR     right   IR FLUORO GUIDE CV LINE RIGHT  06/16/2021   IR IMAGING GUIDED PORT INSERTION  07/07/2021   IR US GUIDE VASC ACCESS LEFT  06/16/2021   POLYPECTOMY     PORT-A-CATH REMOVAL N/A 06/01/2021   Procedure: REMOVAL PORT-A-CATH;  Surgeon: Armandina Gemma, MD;  Location:  [facility];  Service: General;  Laterality: N/A;   PORTACATH PLACEMENT Right 05/13/2021   Procedure: INSERTION PORT-A-CATH;  Surgeon: Dwan Bolt, MD;  Location: [facility];  Service: General;  Laterality: Right;   TEE WITHOUT CARDIOVERSION N/A 06/06/2021   Procedure: TRANSESOPHAGEAL ECHOCARDIOGRAM (TEE);  Surgeon: Lelon Perla, MD;  Location: [facility] ENDOSCOPY;  Service: Cardiovascular;  Laterality: N/A;   TONSILLECTOMY      Allergies  Allergen Reactions   Fexofenadine Other (See Comments)    "dries me out too much"   Pravastatin Sodium Other (See Comments)    Aches   Requip [Ropinirole Hcl] Other (See Comments)    Bad dreams   Hydrochlorothiazide Other (See Comments)    Cramps    Current Outpatient Medications  Medication Sig Dispense Refill   albuterol (PROAIR HFA) 108 (90 Base) MCG/ACT inhaler Inhale 1-2 puffs into the lungs every 6 (six) hours as needed for wheezing or shortness of breath. 8 g 6   alendronate (FOSAMAX) 70 MG tablet Take 70 mg by mouth every Sunday.     aspirin EC 81 MG EC tablet Take 1 tablet (81 mg total) by mouth daily at 6 (six) AM. Swallow whole. (Patient taking differently: Take 81 mg by mouth in the morning. Swallow whole.) 30 tablet 11   blood glucose meter kit and supplies KIT Dispense based on patient and insurance preference. Use up to four times daily as directed. 1 each 0   calcium carbonate (OSCAL) 1500 (600 Ca) MG TABS tablet Take 600 mg of elemental calcium by mouth daily with breakfast.     Continuous Blood Gluc Receiver (FREESTYLE LIBRE 2 READER) DEVI      Continuous Blood Gluc Sensor (FREESTYLE LIBRE 14 DAY SENSOR) MISC Inject 1 Device into the skin every 14 (fourteen) days.     diclofenac Sodium (VOLTAREN) 1 % GEL Apply 2 g topically daily as needed (to painful sites).     DULoxetine (CYMBALTA) 60 MG capsule Take 60 mg by mouth at bedtime.     famciclovir (FAMVIR) 250 MG tablet Take 1 tablet (250 mg total) by mouth 2 (two) times daily. 30 tablet 8    Fish Oil OIL Take 1,500 mg by mouth daily.     insulin glargine (LANTUS) 100 UNIT/ML Solostar Pen Inject 25 Units into the skin daily. 15 mL 11   insulin lispro (HUMALOG) 100 UNIT/ML KwikPen Inject 2-15 Units into the skin 4 (four) times daily - after meals and at bedtime. Glucose 121 - 150: 2 units, Glucose 151 - 200: 3 units, Glucose 201 - 250: 5 units, Glucose 251 - 300:  8 units, Glucose 301 - 350: 11 units, Glucose 351 - 400: 15 units, Glucose > 400 call MD 15 mL 11   Insulin Pen Needle 32G X 4 MM MISC 1 each by Does not apply route 4 (four) times daily - after meals and at bedtime. 100 each 5   LORazepam (ATIVAN) 0.5 MG tablet Take 1 tablet (0.5 mg total) by mouth every 8 (eight) hours. Take for nausea as needed 30 tablet 1   Multiple Vitamin (MULTIVITAMIN ADULT PO) Take 1 tablet by mouth daily.     omeprazole (PRILOSEC) 40 MG capsule Take 40 mg by mouth every evening.     potassium chloride SA (KLOR-CON M20) 20 MEQ tablet TAKE 1 TABLET (20 MEQ TOTAL) BY MOUTH DAILY.  (Patient taking differently: Take 20 mEq by mouth in the morning.) 30 tablet 5   rosuvastatin (CRESTOR) 10 MG tablet Take 10 mg by mouth daily.     tadalafil (CIALIS) 5 MG tablet Take 5 mg by mouth in the morning.     tamsulosin (FLOMAX) 0.4 MG CAPS capsule Take 0.4 mg by mouth every evening.     Zinc 50 MG TABS Take 50 mg by mouth daily.     No current facility-administered medications for this visit.    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  Vaping Use   Vaping Use: Never used  Substance and Sexual Activity   Alcohol use: Yes    Comment: rare beer once or twice   Drug use: No   Sexual activity: Yes  Other Topics Concern   Not on file  Social History Narrative   Not on file   Social Determinants of Health   Financial Resource Strain: Not on file  Food Insecurity: Not on file  Transportation Needs: Not on file  Physical Activity: Not on file  Stress: Not on file  Social Connections: Not on file  Intimate Partner Violence: Not on file     REVIEW OF SYSTEMS:   '[X]'$  denotes positive finding, '[ ]'$  denotes negative finding Cardiac  Comments:  Chest pain or chest pressure:    Shortness of breath upon exertion:    Short of breath when lying flat:    Irregular heart rhythm:        Vascular    Pain in calf, thigh, or hip brought on by ambulation:    Pain in feet at night that wakes you up from your sleep:     Blood clot  in your veins:    Leg swelling:         Pulmonary    Oxygen at home:    Productive cough:     Wheezing:         Neurologic    Sudden weakness in arms or legs:     Sudden numbness in arms or legs:     Sudden onset of difficulty speaking or slurred speech:    Temporary loss of vision in one eye:     Problems with dizziness:         Gastrointestinal    Blood in stool:     Vomited blood:         Genitourinary    Burning when urinating:     Blood in urine:        Psychiatric    Major depression:         Hematologic    Bleeding problems:    Problems with blood clotting too easily:        Skin    Rashes or ulcers:        Constitutional    Fever or chills:      PHYSICAL EXAMINATION:  Today's Vitals    06/15/22 0811  BP: 104/71  Pulse: (!) 47  Resp: 20  Temp: 97.7 F (36.5 C)  TempSrc: Temporal  SpO2: 98%  Weight: 180 lb 14.4 oz (82.1 kg)  Height: 6' (1.829 m)   Body mass index is 24.53 kg/m.   General:  WDWN in NAD; vital signs documented above Gait: Not observed HENT: WNL, normocephalic Pulmonary: normal non-labored breathing  Cardiac: regular HR;  without carotid bruits Abdomen: soft, NT; aortic pulse is not palpable Skin: without rashes Vascular Exam/Pulses:  Right Left  Radial 2+ (normal) 2+ (normal)  Femoral 2+ (normal) 2+ (normal)  PT 2+ (normal) 2+ (normal)   Extremities: without open wounds Musculoskeletal: no muscle wasting or atrophy  Neurologic: A&O X 3 Psychiatric:  The pt has Normal affect.   Non-Invasive Vascular Imaging:   EVAR Arterial duplex on 06/15/2022: Endovascular Aortic Repair (EVAR):  +----------+----------------+-------------------+-------------------+           Diameter AP (cm)Diameter Trans (cm)Velocities (cm/sec)  +----------+----------------+-------------------+-------------------+  Aorta    4.01            3.50               73                   +----------+----------------+-------------------+-------------------+  Right Limb1.70            1.80               53                   +----------+----------------+-------------------+-------------------+  Left Limb 1.50            1.60               76                   +----------+----------------+-------------------+-------------------+    Summary:  Abdominal Aorta: The largest aortic diameter has decreased compared to prior exam. Previous diameter measurement was 4.2 cm obtained on 02/25/2021.     ASSESSMENT/PLAN:: 67 y.o. male here with hx of EVAR on  07/12/2020 by Dr. Carlis Abbott.  On 07/16/2020, he was readmitted to the hospital for burning in both thighs and hypotension.  He was doing well and discharged home the next  day.   -duplex today reveals decrease in aorta  to 4.1cm from 4.2cm and no endoleak is present.  Pt continues to do well.  He will f/u in one year with EVAR duplex on Dr. Carlis Abbott clinic day.  He will call sooner if any issues before then.  -continue asa/statin    Leontine Locket, [facility] [phone]  Clinic MD:   Donzetta Matters

## 2022-06-20 ENCOUNTER — Ambulatory Visit (INDEPENDENT_AMBULATORY_CARE_PROVIDER_SITE_OTHER): Payer: Medicare Other

## 2022-06-20 ENCOUNTER — Other Ambulatory Visit (INDEPENDENT_AMBULATORY_CARE_PROVIDER_SITE_OTHER): Payer: Medicare Other

## 2022-06-20 VITALS — BP 128/85 | HR 87 | Temp 97.8°F | Resp 16 | Ht 72.0 in | Wt 181.4 lb

## 2022-06-20 DIAGNOSIS — M7022 Olecranon bursitis, left elbow: Secondary | ICD-10-CM

## 2022-06-20 LAB — CBC WITH DIFFERENTIAL/PLATELET
Basophils Absolute: 0.1 10*3/uL (ref 0.0–0.1)
Basophils Relative: 1 % (ref 0.0–3.0)
Eosinophils Absolute: 0.1 10*3/uL (ref 0.0–0.7)
Eosinophils Relative: 0.9 % (ref 0.0–5.0)
HCT: 35.3 % — ABNORMAL LOW (ref 39.0–52.0)
Hemoglobin: 12.5 g/dL — ABNORMAL LOW (ref 13.0–17.0)
Lymphocytes Relative: 26.3 % (ref 12.0–46.0)
Lymphs Abs: 1.6 10*3/uL (ref 0.7–4.0)
MCHC: 35.6 g/dL (ref 30.0–36.0)
MCV: 93.5 fl (ref 78.0–100.0)
Monocytes Absolute: 0.5 10*3/uL (ref 0.1–1.0)
Monocytes Relative: 8.4 % (ref 3.0–12.0)
Neutro Abs: 3.9 10*3/uL (ref 1.4–7.7)
Neutrophils Relative %: 63.4 % (ref 43.0–77.0)
Platelets: 139 10*3/uL — ABNORMAL LOW (ref 150.0–400.0)
RBC: 3.77 Mil/uL — ABNORMAL LOW (ref 4.22–5.81)
RDW: 14.2 % (ref 11.5–15.5)
WBC: 6.1 10*3/uL (ref 4.0–10.5)

## 2022-06-20 LAB — COMPREHENSIVE METABOLIC PANEL
ALT: 20 U/L (ref 0–53)
AST: 18 U/L (ref 0–37)
Albumin: 4.2 g/dL (ref 3.5–5.2)
Alkaline Phosphatase: 55 U/L (ref 39–117)
BUN: 23 mg/dL (ref 6–23)
CO2: 29 mEq/L (ref 19–32)
Calcium: 9.9 mg/dL (ref 8.4–10.5)
Chloride: 100 mEq/L (ref 96–112)
Creatinine, Ser: 1.07 mg/dL (ref 0.40–1.50)
GFR: 71.45 mL/min (ref >60.00)
Glucose, Bld: 283 mg/dL — ABNORMAL HIGH (ref 70–99)
Potassium: 4.4 mEq/L (ref 3.5–5.1)
Sodium: 137 mEq/L (ref 135–145)
Total Bilirubin: 0.6 mg/dL (ref 0.2–1.2)
Total Protein: 6.6 g/dL (ref 6.0–8.3)

## 2022-06-20 LAB — SEDIMENTATION RATE: Sed Rate: 13 mm/hr (ref 0–20)

## 2022-06-20 LAB — C-REACTIVE PROTEIN: CRP: <1 mg/dL (ref 0.5–20.0)

## 2022-06-20 MED ORDER — HEPARIN SOD (PORK) LOCK FLUSH 100 UNIT/ML IV SOLN
500.0000 [IU] | Freq: Once | INTRAVENOUS | Status: AC | PRN
  Administered 2022-06-20: 500 [IU]

## 2022-06-20 MED ORDER — DEXTROSE 5 % IV SOLN
1500.0000 mg | Freq: Once | INTRAVENOUS | Status: AC
  Administered 2022-06-20: 1500 mg via INTRAVENOUS
  Filled 2022-06-20: qty 75

## 2022-06-20 NOTE — Progress Notes (Signed)
Diagnosis: Olecranon Bursitis of left elbow  Provider:  Marshell Garfinkel MD  Procedure: Infusion  IV Type: Port a Cath, IV Location: R Chest  Dalvance (Dalbavancin), Dose: 1576m  Infusion Start Time: [id number] Infusion Stop Time: 1435  Post Infusion IV Care: Port a Cath Deaccessed/Flushed with heparin  Discharge: Condition: Good, Destination: Home . AVS Provided and AVS Declined  Performed by:  SCleophus Molt RN

## 2022-06-26 ENCOUNTER — Ambulatory Visit (HOSPITAL_COMMUNITY): Payer: Medicare Other | Attending: Physician Assistant

## 2022-06-26 DIAGNOSIS — I10 Essential (primary) hypertension: Secondary | ICD-10-CM | POA: Insufficient documentation

## 2022-06-26 DIAGNOSIS — I4891 Unspecified atrial fibrillation: Secondary | ICD-10-CM | POA: Insufficient documentation

## 2022-06-26 DIAGNOSIS — I77810 Thoracic aortic ectasia: Secondary | ICD-10-CM | POA: Diagnosis not present

## 2022-06-26 DIAGNOSIS — J449 Chronic obstructive pulmonary disease, unspecified: Secondary | ICD-10-CM | POA: Insufficient documentation

## 2022-06-26 DIAGNOSIS — I700 Atherosclerosis of aorta: Secondary | ICD-10-CM | POA: Insufficient documentation

## 2022-06-26 DIAGNOSIS — R0602 Shortness of breath: Secondary | ICD-10-CM | POA: Insufficient documentation

## 2022-06-26 LAB — ECHOCARDIOGRAM COMPLETE
Area-P 1/2: 2.91 cm2
S' Lateral: 3.8 cm

## 2022-06-28 DIAGNOSIS — I10 Essential (primary) hypertension: Secondary | ICD-10-CM | POA: Diagnosis not present

## 2022-06-28 DIAGNOSIS — E1169 Type 2 diabetes mellitus with other specified complication: Secondary | ICD-10-CM | POA: Diagnosis not present

## 2022-06-28 DIAGNOSIS — E7800 Pure hypercholesterolemia, unspecified: Secondary | ICD-10-CM | POA: Diagnosis not present

## 2022-06-28 DIAGNOSIS — I480 Paroxysmal atrial fibrillation: Secondary | ICD-10-CM | POA: Diagnosis not present

## 2022-06-28 DIAGNOSIS — J439 Emphysema, unspecified: Secondary | ICD-10-CM | POA: Diagnosis not present

## 2022-06-29 ENCOUNTER — Encounter (HOSPITAL_COMMUNITY)
Admission: RE | Admit: 2022-06-29 | Discharge: 2022-06-29 | Disposition: A | Discharge status: Home / Self Care | Payer: Medicare Other | Source: Ambulatory Visit | Attending: Hematology & Oncology | Admitting: Hematology & Oncology

## 2022-06-29 ENCOUNTER — Telehealth: Payer: Self-pay

## 2022-06-29 DIAGNOSIS — S2242XA Multiple fractures of ribs, left side, initial encounter for closed fracture: Secondary | ICD-10-CM | POA: Diagnosis not present

## 2022-06-29 DIAGNOSIS — I6523 Occlusion and stenosis of bilateral carotid arteries: Secondary | ICD-10-CM | POA: Diagnosis not present

## 2022-06-29 DIAGNOSIS — C8338 Diffuse large B-cell lymphoma, lymph nodes of multiple sites: Secondary | ICD-10-CM | POA: Diagnosis not present

## 2022-06-29 DIAGNOSIS — I2510 Atherosclerotic heart disease of native coronary artery without angina pectoris: Secondary | ICD-10-CM | POA: Diagnosis not present

## 2022-06-29 DIAGNOSIS — C8330 Diffuse large B-cell lymphoma, unspecified site: Secondary | ICD-10-CM | POA: Diagnosis not present

## 2022-06-29 LAB — GLUCOSE, CAPILLARY: Glucose-Capillary: 199 mg/dL — ABNORMAL HIGH (ref 70–99)

## 2022-06-29 MED ORDER — FLUDEOXYGLUCOSE F - 18 (FDG) INJECTION
8.6000 | Freq: Once | INTRAVENOUS | Status: AC
  Administered 2022-06-29: 9.06 via INTRAVENOUS

## 2022-06-29 NOTE — Telephone Encounter (Signed)
Advised via MyChart.

## 2022-06-29 NOTE — Telephone Encounter (Signed)
-----   Message from [name], MD sent at 06/29/2022  4:16 PM EST ----- Please call let him know that the PET scan does not show any active lymphoma.  Thanks.Aaron Weaver

## 2022-06-30 ENCOUNTER — Encounter: Payer: Self-pay | Admitting: *Deleted

## 2022-06-30 NOTE — Progress Notes (Signed)
Rviewed PET which shows good treatment response. No follow up appointment scheduled. Message sent to scheduling.   Oncology Nurse Navigator Documentation     06/30/2022    7:30 AM  Oncology Nurse Navigator Flowsheets  Navigator Follow Up Date: 07/03/2022  Navigator Follow Up Reason: Follow-up Appointment  Navigator Location [facility]  Navigator Encounter Type Scan Review  Patient Visit Type MedOnc  Treatment Phase Post-Tx Follow-up  Barriers/Navigation Needs Coordination of Care;Education  Interventions Coordination of Care  Acuity Level 2-Minimal Needs (1-2 Barriers Identified)  Coordination of Care Appts  Support Groups/Services Friends and Family  Time Spent with Patient 15

## 2022-07-03 ENCOUNTER — Inpatient Hospital Stay: Payer: Medicare Other | Attending: Physician Assistant

## 2022-07-03 ENCOUNTER — Inpatient Hospital Stay: Payer: Medicare Other

## 2022-07-03 ENCOUNTER — Ambulatory Visit: Payer: Medicare Other | Attending: Cardiology | Admitting: Cardiology

## 2022-07-03 ENCOUNTER — Inpatient Hospital Stay: Payer: Medicare Other | Admitting: Hematology & Oncology

## 2022-07-03 ENCOUNTER — Encounter: Payer: Self-pay | Admitting: Hematology & Oncology

## 2022-07-03 ENCOUNTER — Encounter: Payer: Self-pay | Admitting: *Deleted

## 2022-07-03 ENCOUNTER — Encounter: Payer: Self-pay | Admitting: Cardiology

## 2022-07-03 VITALS — BP 104/74 | HR 70 | Ht 72.0 in | Wt 184.4 lb

## 2022-07-03 VITALS — BP 124/78 | HR 80 | Temp 98.0°F | Resp 20 | Ht 72.0 in | Wt 183.0 lb

## 2022-07-03 DIAGNOSIS — S51002D Unspecified open wound of left elbow, subsequent encounter: Secondary | ICD-10-CM | POA: Diagnosis not present

## 2022-07-03 DIAGNOSIS — C8338 Diffuse large B-cell lymphoma, lymph nodes of multiple sites: Secondary | ICD-10-CM

## 2022-07-03 DIAGNOSIS — Z9221 Personal history of antineoplastic chemotherapy: Secondary | ICD-10-CM | POA: Insufficient documentation

## 2022-07-03 DIAGNOSIS — E119 Type 2 diabetes mellitus without complications: Secondary | ICD-10-CM | POA: Insufficient documentation

## 2022-07-03 DIAGNOSIS — Z95828 Presence of other vascular implants and grafts: Secondary | ICD-10-CM

## 2022-07-03 DIAGNOSIS — I493 Ventricular premature depolarization: Secondary | ICD-10-CM | POA: Diagnosis not present

## 2022-07-03 DIAGNOSIS — C8330 Diffuse large B-cell lymphoma, unspecified site: Secondary | ICD-10-CM | POA: Insufficient documentation

## 2022-07-03 DIAGNOSIS — R519 Headache, unspecified: Secondary | ICD-10-CM | POA: Insufficient documentation

## 2022-07-03 DIAGNOSIS — E782 Mixed hyperlipidemia: Secondary | ICD-10-CM | POA: Diagnosis not present

## 2022-07-03 DIAGNOSIS — I77810 Thoracic aortic ectasia: Secondary | ICD-10-CM

## 2022-07-03 LAB — CMP (CANCER CENTER ONLY)
ALT: 22 U/L (ref 0–44)
AST: 16 U/L (ref 15–41)
Albumin: 4.4 g/dL (ref 3.5–5.0)
Alkaline Phosphatase: 48 U/L (ref 38–126)
Anion gap: 9 (ref 5–15)
BUN: 22 mg/dL (ref 8–23)
CO2: 25 mmol/L (ref 22–32)
Calcium: 9.2 mg/dL (ref 8.9–10.3)
Chloride: 102 mmol/L (ref 98–111)
Creatinine: 0.96 mg/dL (ref 0.61–1.24)
GFR, Estimated: >60 mL/min (ref >60)
Glucose, Bld: 279 mg/dL — ABNORMAL HIGH (ref 70–99)
Potassium: 4.1 mmol/L (ref 3.5–5.1)
Sodium: 136 mmol/L (ref 135–145)
Total Bilirubin: 0.5 mg/dL (ref 0.3–1.2)
Total Protein: 6.6 g/dL (ref 6.5–8.1)

## 2022-07-03 LAB — CBC WITH DIFFERENTIAL (CANCER CENTER ONLY)
Abs Immature Granulocytes: 0.05 10*3/uL (ref 0.00–0.07)
Basophils Absolute: 0 10*3/uL (ref 0.0–0.1)
Basophils Relative: 1 %
Eosinophils Absolute: 0.1 10*3/uL (ref 0.0–0.5)
Eosinophils Relative: 1 %
HCT: 34.5 % — ABNORMAL LOW (ref 39.0–52.0)
Hemoglobin: 12.1 g/dL — ABNORMAL LOW (ref 13.0–17.0)
Immature Granulocytes: 1 %
Lymphocytes Relative: 17 %
Lymphs Abs: 1.4 10*3/uL (ref 0.7–4.0)
MCH: 32.6 pg (ref 26.0–34.0)
MCHC: 35.1 g/dL (ref 30.0–36.0)
MCV: 93 fL (ref 80.0–100.0)
Monocytes Absolute: 0.7 10*3/uL (ref 0.1–1.0)
Monocytes Relative: 8 %
Neutro Abs: 6.1 10*3/uL (ref 1.7–7.7)
Neutrophils Relative %: 72 %
Platelet Count: 113 10*3/uL — ABNORMAL LOW (ref 150–400)
RBC: 3.71 MIL/uL — ABNORMAL LOW (ref 4.22–5.81)
RDW: 13.3 % (ref 11.5–15.5)
WBC Count: 8.3 10*3/uL (ref 4.0–10.5)
nRBC: 0 % (ref 0.0–0.2)

## 2022-07-03 LAB — LACTATE DEHYDROGENASE: LDH: 171 U/L (ref 98–192)

## 2022-07-03 MED ORDER — HEPARIN SOD (PORK) LOCK FLUSH 100 UNIT/ML IV SOLN
500.0000 [IU] | Freq: Once | INTRAVENOUS | Status: AC
  Administered 2022-07-03: 500 [IU] via INTRAVENOUS

## 2022-07-03 MED ORDER — SODIUM CHLORIDE 0.9% FLUSH
10.0000 mL | Freq: Once | INTRAVENOUS | Status: AC
  Administered 2022-07-03: 10 mL via INTRAVENOUS

## 2022-07-03 NOTE — Progress Notes (Signed)
Hematology and Oncology Follow Up Visit  Aaron PASK Sr. [id number] 08-22-1955 67 y.o. 07/03/2022   Principle Diagnosis:  Diffuse large B-cell NHL -- IPI = 4 MRSA bacteremia  Current Therapy:   R-CHOP --  s/p cycle #8-- started on 05/20/2021 Zometa 4 mg IV every 3 months -next dose 08/2022     Interim History:  Aaron Weaver is back for for follow-up.  He is doing pretty well.  He really has had no specific complaints.  The only problem is been the left elbow.  This is still somewhat infected.  He does see Infectious Disease for this.  We did do a PET scan on him.  This was done on 06/29/2022.  The PET scan did not show any evidence of active non-Hodgkin's lymphoma.  He has had a problem with headaches.  His blood sugars are still on the high side.  Hopefully, he will work on these to get him back down.  He has had no change in bowel or bladder habits.  He has had no problems with fever.  He has had no problems with COVID.  There is no cough or shortness of breath.  He has had no swollen lymph nodes.  There is been no rashes.  Has had no leg swelling.  Currently, I would say that his performance status is probably ECOG 0.    Medications:  Current Outpatient Medications:    alendronate (FOSAMAX) 70 MG tablet, Take 70 mg by mouth every Sunday., Disp: , Rfl:    aspirin EC 81 MG EC tablet, Take 1 tablet (81 mg total) by mouth daily at 6 (six) AM. Swallow whole. (Patient taking differently: Take 81 mg by mouth in the morning. Swallow whole.), Disp: 30 tablet, Rfl: 11   atenolol (TENORMIN) 25 MG tablet, Take 25 mg by mouth daily., Disp: , Rfl:    blood glucose meter kit and supplies KIT, Dispense based on patient and insurance preference. Use up to four times daily as directed., Disp: 1 each, Rfl: 0   calcium carbonate (OSCAL) 1500 (600 Ca) MG TABS tablet, Take 600 mg of elemental calcium by mouth daily with breakfast., Disp: , Rfl:    Continuous Blood Gluc Receiver (FREESTYLE LIBRE 2  READER) DEVI, , Disp: , Rfl:    Continuous Blood Gluc Sensor (FREESTYLE LIBRE 14 DAY SENSOR) MISC, Inject 1 Device into the skin every 14 (fourteen) days., Disp: , Rfl:    diclofenac Sodium (VOLTAREN) 1 % GEL, Apply 2 g topically daily as needed (to painful sites)., Disp: , Rfl:    DULoxetine (CYMBALTA) 60 MG capsule, Take 60 mg by mouth at bedtime., Disp: , Rfl:    Fish Oil OIL, Take 1,500 mg by mouth daily., Disp: , Rfl:    insulin glargine (LANTUS) 100 UNIT/ML Solostar Pen, Inject 25 Units into the skin daily. (Patient taking differently: Inject 45 Units into the skin daily.), Disp: 15 mL, Rfl: 11   insulin lispro (HUMALOG) 100 UNIT/ML KwikPen, Inject 2-15 Units into the skin 4 (four) times daily - after meals and at bedtime. Glucose 121 - 150: 2 units, Glucose 151 - 200: 3 units, Glucose 201 - 250: 5 units, Glucose 251 - 300: 8 units, Glucose 301 - 350: 11 units, Glucose 351 - 400: 15 units, Glucose > 400 call MD, Disp: 15 mL, Rfl: 11   Insulin Pen Needle 32G X 4 MM MISC, 1 each by Does not apply route 4 (four) times daily - after meals and at bedtime.,  Disp: 100 each, Rfl: 5   metFORMIN (GLUCOPHAGE-XR) 500 MG 24 hr tablet, Take 500 mg by mouth daily with breakfast. Pt takes 2 tablets 1000 mg daily., Disp: , Rfl:    Multiple Vitamin (MULTIVITAMIN ADULT PO), Take 1 tablet by mouth daily., Disp: , Rfl:    omeprazole (PRILOSEC) 40 MG capsule, Take 40 mg by mouth every evening., Disp: , Rfl:    potassium chloride SA (KLOR-CON M20) 20 MEQ tablet, TAKE 1 TABLET (20 MEQ TOTAL) BY MOUTH DAILY.  (Patient taking differently: Take 20 mEq by mouth in the morning.), Disp: 30 tablet, Rfl: 5   rosuvastatin (CRESTOR) 10 MG tablet, Take 10 mg by mouth daily., Disp: , Rfl:    tadalafil (CIALIS) 5 MG tablet, Take 5 mg by mouth in the morning., Disp: , Rfl:    tamsulosin (FLOMAX) 0.4 MG CAPS capsule, Take 0.4 mg by mouth every evening., Disp: , Rfl:    Zinc 50 MG TABS, Take 50 mg by mouth daily., Disp: , Rfl:     albuterol (PROAIR HFA) 108 (90 Base) MCG/ACT inhaler, Inhale 1-2 puffs into the lungs every 6 (six) hours as needed for wheezing or shortness of breath. (Patient not taking: Reported on 07/03/2022), Disp: 8 g, Rfl: 6   HYDROcodone-acetaminophen (NORCO) 10-325 MG tablet, Take 1 tablet by mouth as needed for moderate pain. (Patient not taking: Reported on 07/03/2022), Disp: , Rfl:   Allergies:  Allergies  Allergen Reactions   Fexofenadine Other (See Comments)    "dries me out too much"   Pravastatin Sodium Other (See Comments)    Aches   Requip [Ropinirole Hcl] Other (See Comments)    Bad dreams   Hydrochlorothiazide Other (See Comments)    Cramps    Past Medical History, Surgical history, Social history, and Family History were reviewed and updated.  Review of Systems: Review of Systems  Constitutional: Negative.   HENT:  Negative.    Eyes: Negative.   Respiratory: Negative.    Cardiovascular: Negative.   Gastrointestinal: Negative.   Endocrine: Negative.   Genitourinary: Negative.    Musculoskeletal: Negative.   Skin: Negative.   Neurological: Negative.   Hematological: Negative.   Psychiatric/Behavioral: Negative.      Physical Exam:  height is 6' (1.829 m) and weight is 183 lb (83 kg). His oral temperature is 98 F (36.7 C). His blood pressure is 124/78 and his pulse is 80. His respiration is 20 and oxygen saturation is 99%.   Wt Readings from Last 3 Encounters:  07/03/22 183 lb (83 kg)  07/03/22 184 lb 6.4 oz (83.6 kg)  06/20/22 181 lb 6.4 oz (82.3 kg)  His vital signs show temperature of 98.6.  Pulse 72.  Blood pressure 103/62.  Weight is 180 pounds.  Physical Exam Vitals reviewed.  HENT:     Head: Normocephalic and atraumatic.  Eyes:     Pupils: Pupils are equal, round, and reactive to light.  Cardiovascular:     Rate and Rhythm: Normal rate and regular rhythm.     Heart sounds: Normal heart sounds.  Pulmonary:     Effort: Pulmonary effort is normal.      Breath sounds: Normal breath sounds.  Abdominal:     General: Bowel sounds are normal.     Palpations: Abdomen is soft.  Musculoskeletal:        General: No tenderness or deformity. Normal range of motion.     Cervical back: Normal range of motion.     Comments: Evaluation  of the left elbow does show some fluctuance.  There is some erythema.  There is slight tenderness to palpation.  There is no exudate from the eschar.  He has decent range of motion of the elbow.  Lymphadenopathy:     Cervical: No cervical adenopathy.  Skin:    General: Skin is warm and dry.     Findings: No erythema or rash.  Neurological:     Mental Status: He is alert and oriented to person, place, and time.  Psychiatric:        Behavior: Behavior normal.        Thought Content: Thought content normal.        Judgment: Judgment normal.      Lab Results  Component Value Date   WBC 8.3 07/03/2022   HGB 12.1 (L) 07/03/2022   HCT 34.5 (L) 07/03/2022   MCV 93.0 07/03/2022   PLT 113 (L) 07/03/2022     Chemistry      Component Value Date/Time   NA 136 07/03/2022 1350   K 4.1 07/03/2022 1350   CL 102 07/03/2022 1350   CO2 25 07/03/2022 1350   BUN 22 07/03/2022 1350   CREATININE 0.96 07/03/2022 1350      Component Value Date/Time   CALCIUM 9.2 07/03/2022 1350   ALKPHOS 48 07/03/2022 1350   AST 16 07/03/2022 1350   ALT 22 07/03/2022 1350   BILITOT 0.5 07/03/2022 1350      Impression and Plan: Aaron Weaver is a very nice 67 year old white male.  He has diffuse large cell non-Hodgkin's lymphoma.  He has extra nodal disease.  Again, he has a fairly high IPI score.  He has had 8 cycles of chemotherapy with R-CHOP.    From my point of view, he is doing quite well.  There is no evidence of recurrent non-Hodgkin's lymphoma.  I do not think we have do another PET scan on him probably until May.  Will plan to get him back in 6 weeks.  When he comes back, I think he be due for his Zometa.  I described that  his quality of life is doing so well right now.    [name], MD 2/26/20243:03 PM

## 2022-07-03 NOTE — Patient Instructions (Signed)
Medication Instructions:  The current medical regimen is effective;  continue present plan and medications.  *If you need a refill on your cardiac medications before your next appointment, please call your pharmacy*  Follow-Up: At [facility], you and your health needs are our priority.  As part of our continuing mission to provide you with exceptional heart care, we have created designated Provider Care Teams.  These Care Teams include your primary Cardiologist (physician) and Advanced Practice Providers (APPs -  Physician Assistants and Nurse Practitioners) who all work together to provide you with the care you need, when you need it.  We recommend signing up for the patient portal called "MyChart".  Sign up information is provided on this After Visit Summary.  MyChart is used to connect with patients for Virtual Visits (Telemedicine).  Patients are able to view lab/test results, encounter notes, upcoming appointments, etc.  Non-urgent messages can be sent to your provider as well.   To learn more about what you can do with MyChart, go to [email or web address].    Your next appointment:   1 year(s)  Provider:   Candee Furbish, MD

## 2022-07-03 NOTE — Progress Notes (Signed)
Cardiology Office Note:    Date:  07/03/2022   ID:  Aaron Fee Sr., DOB October 11, 1955, MRN [id number]  PCP:  [name], MD (Inactive)   [facility] Providers Cardiologist:  Candee Furbish, MD     Referring MD: [name], MD    History of Present Illness:    Aaron RUFTY Sr. is a 67 y.o. male here for the follow up of prior AAA, tobacco use, diabetes, hypertension, COPD, former patient of Dr. Irven Shelling.  I take care of his wife Aaron Weaver.  He requested visit here.  Underwent endovascular repair of abdominal aortic aneurysm using bifurcated stent graft (aortobiiliac endograft) on 07/12/2020.  During recovery and following day, he had frequent PVCs noted on the telemetry.  He is feeling great.    No early family history of CAD.  He is tolerating his atenolol that he has been taking for several years.  He did not tolerate losartan 50 mg.  He has stopped this.  Blood pressure under adequate control. Longstanding elbow infection, showed me.  Hard to heal.  No chest pain no shortness of breath no fevers chills.  Wife with him today.  Helps with historical items.  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 ([place]) 05/04/2021   Dysrhythmia    GERD (gastroesophageal reflux disease)    Impacted cerumen    Impotence of organic origin    Lumbago    Lung nodule    MRSA bacteremia 05/31/2021   Multiple rib fractures 06/28/2021   Neutropenic fever ([place]) 08/13/2021   Other and unspecified hyperlipidemia    Routine general medical examination at a health care facility    Sepsis due to gram-negative UTI ([place]) 08/13/2021   Skipped heart beats     Syncope and collapse 06/28/2021   Tobacco use disorder    Type II or unspecified type diabetes mellitus without mention of complication, not stated as uncontrolled    type II   Unspecified essential hypertension     Past Surgical History:  Procedure Laterality Date   ABDOMINAL AORTIC ENDOVASCULAR STENT GRAFT Bilateral 07/12/2020   Procedure: ABDOMINAL AORTIC ENDOVASCULAR STENT GRAFT;  Surgeon: [name], MD;  Location: [facility];  Service: Vascular;  Laterality: Bilateral;   AXILLARY LYMPH NODE BIOPSY Right 04/27/2021   Procedure: AXILLARY EXCISIONAL LYMPH NODE BIOPSY;  Surgeon: Dwan Bolt, MD;  Location: [facility] [place] [facility];  Service: General;  Laterality: Right;   BACK SURGERY     fracture   CARPECTOMY Right 09/11/2019   Procedure: PROXIMAL ROW CARPECTOMY; RADIAL STYLOIDECTOMY; POSTERIOR INTEROSSIUS NERVE RESECTION;  Surgeon: Daryll Brod, MD;  Location: [facility];  Service: Orthopedics;  Laterality: Right;  AXILLARY BLOCK   COLONOSCOPY     INGUINAL HERNIA REPAIR     right   IR FLUORO GUIDE CV LINE RIGHT  06/16/2021   IR IMAGING GUIDED PORT INSERTION  07/07/2021   IR US GUIDE VASC ACCESS LEFT  06/16/2021   POLYPECTOMY     PORT-A-CATH REMOVAL N/A 06/01/2021   Procedure:  REMOVAL PORT-A-CATH;  Surgeon: Armandina Gemma, MD;  Location: [facility];  Service: General;  Laterality: N/A;   PORTACATH PLACEMENT Right 05/13/2021   Procedure: INSERTION PORT-A-CATH;  Surgeon: Dwan Bolt, MD;  Location: [facility];  Service: General;  Laterality: Right;   TEE WITHOUT CARDIOVERSION N/A 06/06/2021   Procedure: TRANSESOPHAGEAL ECHOCARDIOGRAM (TEE);  Surgeon: Lelon Perla, MD;  [facility];  Service: Cardiovascular;  Laterality: N/A;   TONSILLECTOMY      Current Medications: Current Meds  Medication Sig   albuterol (PROAIR HFA) 108 (90 Base) MCG/ACT inhaler Inhale 1-2 puffs into the lungs every 6 (six) hours as needed for wheezing or shortness of breath.    alendronate (FOSAMAX) 70 MG tablet Take 70 mg by mouth every Sunday.   aspirin EC 81 MG EC tablet Take 1 tablet (81 mg total) by mouth daily at 6 (six) AM. Swallow whole. (Patient taking differently: Take 81 mg by mouth in the morning. Swallow whole.)   atenolol (TENORMIN) 25 MG tablet Take 25 mg by mouth daily.   blood glucose meter kit and supplies KIT Dispense based on patient and insurance preference. Use up to four times daily as directed.   calcium carbonate (OSCAL) 1500 (600 Ca) MG TABS tablet Take 600 mg of elemental calcium by mouth daily with breakfast.   Continuous Blood Gluc Receiver (FREESTYLE LIBRE 2 READER) DEVI    Continuous Blood Gluc Sensor (FREESTYLE LIBRE 14 DAY SENSOR) MISC Inject 1 Device into the skin every 14 (fourteen) days.   diclofenac Sodium (VOLTAREN) 1 % GEL Apply 2 g topically daily as needed (to painful sites).   DULoxetine (CYMBALTA) 60 MG capsule Take 60 mg by mouth at bedtime.   famciclovir (FAMVIR) 250 MG tablet Take 1 tablet (250 mg total) by mouth 2 (two) times daily.   Fish Oil OIL Take 1,500 mg by mouth daily.   HYDROcodone-acetaminophen (NORCO) 10-325 MG tablet Take 1 tablet by mouth as needed for moderate pain.   insulin glargine (LANTUS) 100 UNIT/ML Solostar Pen Inject 25 Units into the skin daily.   insulin lispro (HUMALOG) 100 UNIT/ML KwikPen Inject 2-15 Units into the skin 4 (four) times daily - after meals and at bedtime. Glucose 121 - 150: 2 units, Glucose 151 - 200: 3 units, Glucose 201 - 250: 5 units, Glucose 251 - 300: 8 units, Glucose 301 - 350: 11 units, Glucose 351 - 400: 15 units, Glucose > 400 call MD   Insulin Pen Needle 32G X 4 MM MISC 1 each by Does not apply route 4 (four) times daily - after meals and at bedtime.   LORazepam (ATIVAN) 0.5 MG tablet Take 1 tablet (0.5 mg total) by mouth every 8 (eight) hours. Take for nausea as needed   meloxicam (MOBIC) 7.5 MG tablet Take 7.5 mg by mouth as needed for pain.   metFORMIN (GLUCOPHAGE-XR) 500  MG 24 hr tablet Take 500 mg by mouth daily with breakfast. Pt takes 2 tablets 1000 mg daily.   Multiple Vitamin (MULTIVITAMIN ADULT PO) Take 1 tablet by mouth daily.   omeprazole (PRILOSEC) 40 MG capsule Take 40 mg by mouth every evening.   potassium chloride SA (KLOR-CON M20) 20 MEQ tablet TAKE 1 TABLET (20 MEQ TOTAL) BY MOUTH DAILY.  (Patient taking differently: Take 20 mEq by mouth in the morning.)   rosuvastatin (CRESTOR) 10 MG tablet Take 10 mg by mouth daily.   tadalafil (CIALIS) 5 MG tablet Take 5 mg by mouth in the morning.  tamsulosin (FLOMAX) 0.4 MG CAPS capsule Take 0.4 mg by mouth every evening.   Zinc 50 MG TABS Take 50 mg by mouth daily.     Allergies:   Fexofenadine, Pravastatin sodium, Requip [ropinirole hcl], and Hydrochlorothiazide   Social History   Socioeconomic History   Marital status: Married    Spouse name: Aaron Weaver   Number of children: 1   Years of education: Not on file   Highest education level: Not on file  Occupational History   Occupation: [employer]  Tobacco Use   Smoking status: Former    Packs/day: 0.50    Years: 53.00    Total pack years: 26.50    Types: Cigarettes    Start date: 03/31/1965    Quit date: 07/22/2017    Years since quitting: 4.9    Passive exposure: Never   Smokeless tobacco: Never   Tobacco comments:    started at age 89.  Vaping Use   Vaping Use: Never used  Substance and Sexual Activity   Alcohol use: Yes    Comment: rare beer once or twice   Drug use: No   Sexual activity: Yes  Other Topics Concern   Not on file  Social History Narrative   Not on file   Social Determinants of Health   Financial Resource Strain: Not on file  Food Insecurity: Not on file  Transportation Needs: Not on file  Physical Activity: Not on file  Stress: Not on file  Social Connections: Not on file     Family History: The patient's family history includes Colon cancer (age of onset: 25) in his mother; Colon polyps in his  brother; Coronary artery disease in an other family member; Hypertension in his father. There is no history of Esophageal cancer, Rectal cancer, or Stomach cancer.  ROS:   Please see the history of present illness.     All other systems reviewed and are negative.  EKGs/Labs/Other Studies Reviewed:    The following studies were reviewed today:   Echocardiogram 06/20/2018: 1. The left ventricle has normal systolic function, with an ejection fraction of 55-60%. The cavity size was normal. Left ventricular diastolic Doppler parameters are consistent with impaired relaxation No evidence of left ventricular regional wall  motion abnormalities.  2. The right ventricle has normal systolic function. The cavity was normal. There is no increase in right ventricular wall thickness.  3. Minimal TR doppler jet so unable to estimate PA systolic pressure.  ECHO 2024:   1. Poor acoustic windows Difficult to see endocardium Consider limited  echo with Definity to further define LVEF /wall motion. . Left ventricular  ejection fraction, by estimation, is 50 to 55%. The left ventricle has low  normal function. Left  ventricular diastolic parameters are indeterminate.   2. Right ventricular systolic function is low normal. The right  ventricular size is normal.   3. The mitral valve is normal in structure. Trivial mitral valve  regurgitation.   4. The aortic valve is tricuspid. Aortic valve regurgitation is not  visualized.   EKG: As described  Recent Labs: 08/13/2021: TSH 0.786 08/17/2021: Magnesium 1.8 06/20/2022: ALT 20; BUN 23; Creatinine, Ser 1.07; Hemoglobin 12.5; Platelets 139.0; Potassium 4.4; Sodium 137  Recent Lipid Panel    Component Value Date/Time   CHOL 117 11/23/2011 0756   TRIG 85.0 11/23/2011 0756   HDL 35.10 (L) 11/23/2011 0756   CHOLHDL 3 11/23/2011 0756   VLDL 17.0 11/23/2011 0756   LDLCALC 65 11/23/2011 0756  LDLDIRECT 73.2 07/19/2010 0757     Risk  Assessment/Calculations:      Physical Exam:    VS:  BP 104/74   Pulse 70   Ht 6' (1.829 m)   Wt 184 lb 6.4 oz (83.6 kg)   SpO2 96%   BMI 25.01 kg/m     Wt Readings from Last 3 Encounters:  07/03/22 184 lb 6.4 oz (83.6 kg)  06/20/22 181 lb 6.4 oz (82.3 kg)  06/15/22 180 lb 14.4 oz (82.1 kg)     GEN:  Well nourished, well developed in no acute distress HEENT: Normal NECK: No JVD; No carotid bruits LYMPHATICS: No lymphadenopathy CARDIAC: RRR, no murmurs, rubs, gallops RESPIRATORY: Wheezing heard bilaterally ABDOMEN: Soft, non-tender, non-distended MUSCULOSKELETAL:  No edema; No deformity.  Left elbow wound noted.  Mild redness.  Longstanding. SKIN: Warm and dry NEUROLOGIC:  Alert and oriented x 3 PSYCHIATRIC:  Normal affect   ASSESSMENT:    1. History of endovascular stent graft for abdominal aortic aneurysm (AAA)   2. Mixed hyperlipidemia   3. Dilated aortic root ([place])   4. PVC (premature ventricular contraction)   5. 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 Carlis Abbott ([facility]) 07/12/20 - Currently on aspirin, statin with good control.  On atenolol longstanding (Used to be '100mg'$  now '25mg'$ ).  Blood pressure excellent, normal pedal pulses.  Lower extremity duplex on 06/15/2022 (prior vascular visit) aorta 4.2 cm with no endoleak. Note reviewed.  Doing well.  COPD - Quit smoking 2020.  Excellent. No wheezing on exam.   Pulmonary nodules have been followed.  In fact this is how they discovered his AAA.  Frequent PVCs PACs - Longstanding palpitations since childhood.  EKG personally reviewed and interpreted from 07/22/2020 shows PACs, sinus rhythm heart rate in the 80s. Had them his whole life. Wore a monitor in his 20's. Was told AFIB during hospital stay, but PAC's. - Has been on atenolol 25 mg a day as beta-blocker to help suppress.  Essential hypertension - Currently  taking atenolol 25 mg a day.  It looks like his losartan was discontinued.  He states that he has not been able to take this medication in the past.  Hypokalemia - Has been taking 20 meq of potassium daily.Last 4.4  Hyperlipidemia - On Crestor 10 mg a day LDL 60.  Excellent.  No evidence of active lymphoma on PET scan 06/2022-Dr. Ennever  Left elbow infection, long standing. Abx. Had IV abx.   1 year follow-up.     Medication Adjustments/Labs and Tests Ordered: Current medicines are reviewed at length with the patient today.  Concerns regarding medicines are outlined above.  No orders of the defined types were placed in this encounter.  No orders of the defined types were placed in this encounter.   Patient Instructions  Medication Instructions:  The current medical regimen is effective;  continue present plan and medications.  *If you need a refill on your cardiac medications before your next appointment, please call your pharmacy*  Follow-Up: At [facility], you and your health needs are our priority.  As part of our continuing mission to provide you with exceptional heart care, we have created designated Provider Care Teams.  These Care Teams include your primary Cardiologist (physician) and Advanced Practice Providers (APPs -  Physician Assistants and Nurse Practitioners) who all work together to provide you  with the care you need, when you need it.  We recommend signing up for the patient portal called "MyChart".  Sign up information is provided on this After Visit Summary.  MyChart is used to connect with patients for Virtual Visits (Telemedicine).  Patients are able to view lab/test results, encounter notes, upcoming appointments, etc.  Non-urgent messages can be sent to your provider as well.   To learn more about what you can do with MyChart, go to [email or web address].    Your next appointment:   1 year(s)  Provider:   Candee Furbish, MD        Signed, Candee Furbish, MD  07/03/2022 8:38 AM    [place]

## 2022-07-03 NOTE — Progress Notes (Signed)
Patient has completed treatment and post treatment PET shows good response. At this time will discontinue active navigation but be available to the patient as needed in the future.   Oncology Nurse Navigator Documentation     07/03/2022    2:00 PM  Oncology Nurse Navigator Flowsheets  Navigation Complete Date: 07/03/2022  Post Navigation: Continue to Follow Patient? No  Reason Not Navigating Patient: No Treatment, Observation Only  Navigator Location [facility]  Navigator Encounter Type Treatment;Appt/Treatment Plan Review  Patient Visit Type MedOnc  Treatment Phase Post-Tx Follow-up  Barriers/Navigation Needs No Barriers At This Time  Interventions Psycho-Social Support  Acuity Level 1-No Barriers  Support Groups/Services Friends and Family  Time Spent with Patient 15

## 2022-07-04 LAB — IGG, IGA, IGM
IgA: 90 mg/dL (ref 61–437)
IgG (Immunoglobin G), Serum: 770 mg/dL (ref 603–1613)
IgM (Immunoglobulin M), Srm: 16 mg/dL — ABNORMAL LOW (ref 20–172)

## 2022-07-07 ENCOUNTER — Encounter: Payer: Self-pay | Admitting: Internal Medicine

## 2022-07-07 ENCOUNTER — Ambulatory Visit: Payer: Medicare Other | Admitting: Internal Medicine

## 2022-07-07 ENCOUNTER — Other Ambulatory Visit: Payer: Self-pay

## 2022-07-07 VITALS — BP 125/83 | HR 78 | Temp 98.0°F | Ht 72.0 in | Wt 184.0 lb

## 2022-07-07 DIAGNOSIS — M7022 Olecranon bursitis, left elbow: Secondary | ICD-10-CM

## 2022-07-07 DIAGNOSIS — M719 Bursopathy, unspecified: Secondary | ICD-10-CM | POA: Insufficient documentation

## 2022-07-07 MED ORDER — SULFAMETHOXAZOLE-TRIMETHOPRIM 800-160 MG PO TABS: 1.0000 | ORAL_TABLET | Freq: Two times a day (BID) | ORAL | 0 refills | Status: DC

## 2022-07-07 MED ORDER — CLARITHROMYCIN 500 MG PO TABS: 500.0000 mg | ORAL_TABLET | Freq: Two times a day (BID) | ORAL | 0 refills | Status: DC

## 2022-07-07 NOTE — Assessment & Plan Note (Addendum)
Will swab for AFB and fungal culture to check for other types of infection.  I suspect this is why it has improved on doxycycline but not cured.   In the meantime, I will have him try bactrim 1 DS twice daily and clarithromycin 500 mg twice daily for possible AFB infection while waiting results.   Will change treatment based on any growth and he is aware of this.    I have personally spent 30 minutes involved in face-to-face and non-face-to-face activities for this patient on the day of the visit. Professional time spent includes the following activities: Preparing to see the patient (review of tests), Obtaining and/or reviewing separately obtained history (admission/discharge record), Performing a medically appropriate examination and/or evaluation , Ordering medications/tests/procedures, referring and communicating with other health care professionals, Documenting clinical information in the EMR, Independently interpreting results (not separately reported), Communicating results to the patient/family/caregiver, Counseling and educating the patient/family/caregiver and Care coordination (not separately reported).

## 2022-07-07 NOTE — Progress Notes (Signed)
   Subjective:    Patient ID: Aaron Fee Sr., male    DOB: 1955-08-11, 66 y.o.   MRN: [id number]  HPI Aaron Weaver is here for follow up of olecranon bursitis. He has had persistent drainage from the area for months with relapsing drainage and seemed to improve with doxycycline but relapsed when that was stopped.  I had him take dalbavancin x 2 doses and there was no improvement.  Still draining a small amount of pus.  No other complaints.     Review of Systems  Constitutional:  Negative for chills and fever.  Gastrointestinal:  Negative for diarrhea and nausea.  Skin:  Negative for rash.       Objective:   Physical Exam Musculoskeletal:     Comments: Left elbow with open, draining area with serosanguinous fluid noted.            Assessment & Plan:

## 2022-07-07 NOTE — Addendum Note (Signed)
Addended by: Caffie Pinto on: 07/07/2022 09:37 AM   Modules accepted: Orders

## 2022-07-20 ENCOUNTER — Telehealth: Payer: Self-pay | Admitting: Pharmacist

## 2022-07-20 ENCOUNTER — Other Ambulatory Visit: Payer: Self-pay

## 2022-07-20 ENCOUNTER — Telehealth: Payer: Self-pay

## 2022-07-20 ENCOUNTER — Encounter: Payer: Self-pay | Admitting: Internal Medicine

## 2022-07-20 ENCOUNTER — Ambulatory Visit: Payer: Medicare Other | Admitting: Internal Medicine

## 2022-07-20 ENCOUNTER — Other Ambulatory Visit (HOSPITAL_COMMUNITY): Payer: Self-pay

## 2022-07-20 VITALS — BP 139/84 | HR 87 | Resp 16 | Ht 72.0 in | Wt 184.0 lb

## 2022-07-20 DIAGNOSIS — M7022 Olecranon bursitis, left elbow: Secondary | ICD-10-CM | POA: Diagnosis not present

## 2022-07-20 MED ORDER — TERBINAFINE HCL 250 MG PO TABS: 500.0000 mg | ORAL_TABLET | Freq: Every day | ORAL | 0 refills | Status: DC

## 2022-07-20 MED ORDER — FLUCONAZOLE 200 MG PO TABS: 200.0000 mg | ORAL_TABLET | Freq: Every day | ORAL | 1 refills | Status: DC

## 2022-07-20 MED ORDER — ITRACONAZOLE 100 MG PO CAPS: 200.0000 mg | ORAL_CAPSULE | Freq: Two times a day (BID) | ORAL | 1 refills | Status: DC

## 2022-07-20 MED ORDER — ITRACONAZOLE 10 MG/ML PO SOLN: 200.0000 mg | Freq: Two times a day (BID) | ORAL | 3 refills | Status: DC

## 2022-07-20 NOTE — Progress Notes (Signed)
   Subjective:    Patient ID: Aaron Fee Sr., male    DOB: [birth year], 67 y.o.   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  Cultures from his last visit now with two fungal organisms.  No issues with the medications.     Review of Systems  Constitutional:  Negative for fatigue.  Gastrointestinal:  Negative for diarrhea.  Skin:  Negative for rash.       Objective:   Physical Exam Eyes:     General: No scleral icterus. Pulmonary:     Effort: Pulmonary effort is normal.  Musculoskeletal:     Comments: Left elbow with no significant erythema, no warmth, + pus-like drainag  Neurological:     Mental Status: He is alert.           Assessment & Plan:

## 2022-07-20 NOTE — Assessment & Plan Note (Signed)
Continues to be a chronic infection.  At this point, with the fungal growth, will try to target these organisms with both itraconazole and fluconazole and see if he has any benefit.  PA will need to be done.   Reviewed medication record and discussed with pharmacy.   Will need to dose adjust cialis to 2.5 mg.   Will have him follow up in about 3 weeks to see if there is any improvement.   I have personally spent 45 minutes involved in face-to-face and non-face-to-face activities for this patient on the day of the visit. Professional time spent includes the following activities: Preparing to see the patient (review of tests), Obtaining and/or reviewing separately obtained history (admission/discharge record), Performing a medically appropriate examination and/or evaluation , Ordering medications/tests/procedures, referring and communicating with other health care professionals, Documenting clinical information in the EMR, Independently interpreting results (not separately reported), Communicating results to the patient/family/caregiver, Counseling and educating the patient/family/caregiver and Care coordination (not separately reported).

## 2022-07-20 NOTE — Telephone Encounter (Signed)
[facility] Patient Advocate Encounter   Received notification from [employer] Medicare Part D that prior authorization for Itraconazole is required.   PA submitted on 07/20/2022 Key [id number] Status is pending    [facility] will continue to follow.   Ileene Patrick, [facility] Patient [facility] Phone: [phone] Fax:  [phone]

## 2022-07-20 NOTE — Telephone Encounter (Signed)
Patient's insurance denied coverage for itraconazole. Data on treatment for chaetomium and acremonium infections is very limited, but there is a general consensus that fluconazole is unreliable for both organisms. Could theoretically trial voriconazole though this will likely also require PA.   Discussed with Dr. Linus Salmons and agreed to trial terbinafine as there have been case reports demonstrating success with this medication. Terbinafine '500mg'$  once daily sent to [facility]. Copay will be $5.  Called patient to review plan which he is in agreement with. Can take without regard to meals. Instructed to take 2 tablets daily and to watch for hypotension since he also takes Flomax. No changes to Cialis needed with terbinafine (as compared to itraconazole). [facility] to cancel out fluconazole and itraconazole prescriptions which have been discontinued in Epic.  Alfonse Spruce, PharmD, CPP, BCIDP, [facility] Clinical Pharmacist Practitioner Infectious [facility]

## 2022-07-27 DIAGNOSIS — E7800 Pure hypercholesterolemia, unspecified: Secondary | ICD-10-CM | POA: Diagnosis not present

## 2022-07-27 DIAGNOSIS — I77810 Thoracic aortic ectasia: Secondary | ICD-10-CM | POA: Diagnosis not present

## 2022-07-27 DIAGNOSIS — I700 Atherosclerosis of aorta: Secondary | ICD-10-CM | POA: Diagnosis not present

## 2022-07-27 DIAGNOSIS — Z0000 Encounter for general adult medical examination without abnormal findings: Secondary | ICD-10-CM | POA: Diagnosis not present

## 2022-07-27 DIAGNOSIS — D8481 Immunodeficiency due to conditions classified elsewhere: Secondary | ICD-10-CM | POA: Diagnosis not present

## 2022-07-27 DIAGNOSIS — I10 Essential (primary) hypertension: Secondary | ICD-10-CM | POA: Diagnosis not present

## 2022-07-27 DIAGNOSIS — J449 Chronic obstructive pulmonary disease, unspecified: Secondary | ICD-10-CM | POA: Diagnosis not present

## 2022-07-27 DIAGNOSIS — Z23 Encounter for immunization: Secondary | ICD-10-CM | POA: Diagnosis not present

## 2022-07-27 DIAGNOSIS — E1121 Type 2 diabetes mellitus with diabetic nephropathy: Secondary | ICD-10-CM | POA: Diagnosis not present

## 2022-07-27 DIAGNOSIS — H6123 Impacted cerumen, bilateral: Secondary | ICD-10-CM | POA: Diagnosis not present

## 2022-07-27 DIAGNOSIS — C8330 Diffuse large B-cell lymphoma, unspecified site: Secondary | ICD-10-CM | POA: Diagnosis not present

## 2022-07-27 DIAGNOSIS — Z8781 Personal history of (healed) traumatic fracture: Secondary | ICD-10-CM | POA: Diagnosis not present

## 2022-08-01 ENCOUNTER — Telehealth: Payer: Self-pay | Admitting: *Deleted

## 2022-08-01 NOTE — Telephone Encounter (Signed)
Received a call from Reader at [place].  Dr  Mannie Stabile saw patient in office as new patient yesterday and noticed patient was getting Zometa IV infusion and Fosamax weekly orally.  Dr Marin Olp notified and recommends stopping Fosamax.  [facility] notified and will let patient know.

## 2022-08-11 ENCOUNTER — Encounter: Payer: Self-pay | Admitting: Internal Medicine

## 2022-08-11 ENCOUNTER — Other Ambulatory Visit: Payer: Self-pay

## 2022-08-11 ENCOUNTER — Ambulatory Visit: Payer: Medicare Other | Admitting: Internal Medicine

## 2022-08-11 VITALS — BP 158/102 | HR 79 | Temp 97.6°F | Resp 16 | Ht 72.0 in | Wt 186.6 lb

## 2022-08-11 DIAGNOSIS — M7022 Olecranon bursitis, left elbow: Secondary | ICD-10-CM | POA: Diagnosis not present

## 2022-08-11 NOTE — Assessment & Plan Note (Signed)
Much improved now on the terbinafine.  I am cautiously encouraged with this and will have him continue with this for a prolonged period, at least 2 months.   Will check his hepatic function panel today, if ok, he will continue.    I have personally spent 30 minutes involved in face-to-face and non-face-to-face activities for this patient on the day of the visit. Professional time spent includes the following activities: Preparing to see the patient (review of tests), Obtaining and/or reviewing separately obtained history (admission/discharge record), Performing a medically appropriate examination and/or evaluation , Ordering medications/tests/procedures, referring and communicating with other health care professionals, Documenting clinical information in the EMR, Independently interpreting results (not separately reported), Communicating results to the patient/family/caregiver, Counseling and educating the patient/family/caregiver and Care coordination (not separately reported).

## 2022-08-11 NOTE — Progress Notes (Signed)
   Subjective:    Patient ID: Aaron Motto Sr., male    DOB: Jan 21, 1956, 67 y.o.   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 He has had multiple treatments with little benefit and culture ultimately have grown out fungal organisms.  Itraconazole was declined by his insurance and now started on terbenafine.  Since starting over two weeks ago, his elbow area has dried up nicely and appears to be healing.  He is tolerating the medication well.     Review of Systems  Constitutional:  Negative for fever.  Gastrointestinal:  Negative for diarrhea and nausea.  Skin:  Negative for rash.       Objective:   Physical Exam Constitutional:      Appearance: Normal appearance.  Eyes:     General: No scleral icterus. Pulmonary:     Effort: Pulmonary effort is normal.  Musculoskeletal:     Comments: Left elbow with drying lesion area, no drainage, no surrounding erythema.     SH: no tobacco       Assessment & Plan:

## 2022-08-12 LAB — HEPATIC FUNCTION PANEL
AG Ratio: 1.9 (calc) (ref 1.0–2.5)
ALT: 31 U/L (ref 9–46)
AST: 19 U/L (ref 10–35)
Albumin: 4.3 g/dL (ref 3.6–5.1)
Alkaline phosphatase (APISO): 62 U/L (ref 35–144)
Bilirubin, Direct: 0.1 mg/dL (ref 0.0–0.2)
Globulin: 2.3 g/dL (calc) (ref 1.9–3.7)
Indirect Bilirubin: 0.3 mg/dL (calc) (ref 0.2–1.2)
Total Bilirubin: 0.4 mg/dL (ref 0.2–1.2)
Total Protein: 6.6 g/dL (ref 6.1–8.1)

## 2022-08-14 ENCOUNTER — Inpatient Hospital Stay: Payer: Medicare Other

## 2022-08-14 ENCOUNTER — Inpatient Hospital Stay: Payer: Medicare Other | Attending: Physician Assistant

## 2022-08-14 ENCOUNTER — Inpatient Hospital Stay: Payer: Medicare Other | Admitting: Hematology & Oncology

## 2022-08-14 ENCOUNTER — Encounter: Payer: Self-pay | Admitting: Hematology & Oncology

## 2022-08-14 VITALS — BP 123/71 | HR 76

## 2022-08-14 VITALS — BP 107/78 | HR 87 | Temp 98.5°F | Resp 18 | Ht 72.0 in | Wt 182.1 lb

## 2022-08-14 DIAGNOSIS — Z9221 Personal history of antineoplastic chemotherapy: Secondary | ICD-10-CM | POA: Diagnosis not present

## 2022-08-14 DIAGNOSIS — C8338 Diffuse large B-cell lymphoma, lymph nodes of multiple sites: Secondary | ICD-10-CM

## 2022-08-14 DIAGNOSIS — Z95828 Presence of other vascular implants and grafts: Secondary | ICD-10-CM

## 2022-08-14 DIAGNOSIS — S51002D Unspecified open wound of left elbow, subsequent encounter: Secondary | ICD-10-CM | POA: Diagnosis not present

## 2022-08-14 DIAGNOSIS — B9562 Methicillin resistant Staphylococcus aureus infection as the cause of diseases classified elsewhere: Secondary | ICD-10-CM | POA: Insufficient documentation

## 2022-08-14 DIAGNOSIS — C8330 Diffuse large B-cell lymphoma, unspecified site: Secondary | ICD-10-CM | POA: Diagnosis not present

## 2022-08-14 DIAGNOSIS — M25552 Pain in left hip: Secondary | ICD-10-CM

## 2022-08-14 DIAGNOSIS — S2249XS Multiple fractures of ribs, unspecified side, sequela: Secondary | ICD-10-CM

## 2022-08-14 LAB — CBC WITH DIFFERENTIAL (CANCER CENTER ONLY)
Abs Immature Granulocytes: 0.02 10*3/uL (ref 0.00–0.07)
Basophils Absolute: 0.1 10*3/uL (ref 0.0–0.1)
Basophils Relative: 1 %
Eosinophils Absolute: 0.1 10*3/uL (ref 0.0–0.5)
Eosinophils Relative: 2 %
HCT: 36.4 % — ABNORMAL LOW (ref 39.0–52.0)
Hemoglobin: 12.9 g/dL — ABNORMAL LOW (ref 13.0–17.0)
Immature Granulocytes: 0 %
Lymphocytes Relative: 26 %
Lymphs Abs: 1.5 10*3/uL (ref 0.7–4.0)
MCH: 32.7 pg (ref 26.0–34.0)
MCHC: 35.4 g/dL (ref 30.0–36.0)
MCV: 92.4 fL (ref 80.0–100.0)
Monocytes Absolute: 0.7 10*3/uL (ref 0.1–1.0)
Monocytes Relative: 12 %
Neutro Abs: 3.4 10*3/uL (ref 1.7–7.7)
Neutrophils Relative %: 59 %
Platelet Count: 137 10*3/uL — ABNORMAL LOW (ref 150–400)
RBC: 3.94 MIL/uL — ABNORMAL LOW (ref 4.22–5.81)
RDW: 13.5 % (ref 11.5–15.5)
WBC Count: 5.7 10*3/uL (ref 4.0–10.5)
nRBC: 0 % (ref 0.0–0.2)

## 2022-08-14 LAB — CMP (CANCER CENTER ONLY)
ALT: 25 U/L (ref 0–44)
AST: 19 U/L (ref 15–41)
Albumin: 4.3 g/dL (ref 3.5–5.0)
Alkaline Phosphatase: 53 U/L (ref 38–126)
Anion gap: 11 (ref 5–15)
BUN: 20 mg/dL (ref 8–23)
CO2: 26 mmol/L (ref 22–32)
Calcium: 9.5 mg/dL (ref 8.9–10.3)
Chloride: 101 mmol/L (ref 98–111)
Creatinine: 0.96 mg/dL (ref 0.61–1.24)
GFR, Estimated: >60 mL/min (ref >60)
Glucose, Bld: 157 mg/dL — ABNORMAL HIGH (ref 70–99)
Potassium: 4.5 mmol/L (ref 3.5–5.1)
Sodium: 138 mmol/L (ref 135–145)
Total Bilirubin: 0.5 mg/dL (ref 0.3–1.2)
Total Protein: 7 g/dL (ref 6.5–8.1)

## 2022-08-14 LAB — LACTATE DEHYDROGENASE: LDH: 123 U/L (ref 98–192)

## 2022-08-14 MED ORDER — HEPARIN SOD (PORK) LOCK FLUSH 100 UNIT/ML IV SOLN
500.0000 [IU] | Freq: Once | INTRAVENOUS | Status: AC
  Administered 2022-08-14: 500 [IU] via INTRAVENOUS

## 2022-08-14 MED ORDER — SODIUM CHLORIDE 0.9 % IV SOLN: Freq: Once | INTRAVENOUS | Status: AC

## 2022-08-14 MED ORDER — ZOLEDRONIC ACID 4 MG/100ML IV SOLN
4.0000 mg | Freq: Once | INTRAVENOUS | Status: AC
  Administered 2022-08-14: 4 mg via INTRAVENOUS
  Filled 2022-08-14: qty 100

## 2022-08-14 MED ORDER — SODIUM CHLORIDE 0.9% FLUSH
3.0000 mL | Freq: Once | INTRAVENOUS | Status: AC | PRN
  Administered 2022-08-14: 10 mL

## 2022-08-14 NOTE — Progress Notes (Signed)
Hematology and Oncology Follow Up Visit  Aaron PANCIERA Sr. [id number] Apr 18, 1956 67 y.o. 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  He really looks great.  He feels wonderful.  He is doing a lot of yard work.  He is riding his tractor.  He actually has a fungal infection in the left elbow.  He is on oral medication right now.  Nothing is taken terbinafine.  He has had no problems with fever.  There is no cough.  He has had no cardiac issues.  There is been no change in bowel or bladder habits.  He has had no rashes.  There is been no leg swelling.  His blood sugars have been doing a lot better.  Overall, I would say that his performance status is probably ECOG 1.    Medications:  Current Outpatient Medications:    aspirin EC 81 MG EC tablet, Take 1 tablet (81 mg total) by mouth daily at 6 (six) AM. Swallow whole. (Patient taking differently: Take 81 mg by mouth in the morning. Swallow whole.), Disp: 30 tablet, Rfl: 11   atenolol (TENORMIN) 25 MG tablet, Take 25 mg by mouth daily., Disp: , Rfl:    blood glucose meter kit and supplies KIT, Dispense based on patient and insurance preference. Use up to four times daily as directed., Disp: 1 each, Rfl: 0   calcium carbonate (OSCAL) 1500 (600 Ca) MG TABS tablet, Take 600 mg of elemental calcium by mouth daily with breakfast., Disp: , Rfl:    Continuous Blood Gluc Receiver (FREESTYLE LIBRE 2 READER) DEVI, , Disp: , Rfl:    Continuous Blood Gluc Sensor (FREESTYLE LIBRE 14 DAY SENSOR) MISC, Inject 1 Device into the skin every 14 (fourteen) days., Disp: , Rfl:    diclofenac Sodium (VOLTAREN) 1 % GEL, Apply 2 g topically daily as needed (to painful sites)., Disp: , Rfl:    DULoxetine (CYMBALTA) 60 MG capsule, Take 60 mg  by mouth at bedtime., Disp: , Rfl:    Fish Oil OIL, Take 1,500 mg by mouth daily., Disp: , Rfl:    insulin glargine (LANTUS) 100 UNIT/ML Solostar Pen, Inject 25 Units into the skin daily. (Patient taking differently: Inject 45 Units into the skin daily.), Disp: 15 mL, Rfl: 11   insulin lispro (HUMALOG) 100 UNIT/ML KwikPen, Inject 2-15 Units into the skin 4 (four) times daily - after meals and at bedtime. Glucose 121 - 150: 2 units, Glucose 151 - 200: 3 units, Glucose 201 - 250: 5 units, Glucose 251 - 300: 8 units, Glucose 301 - 350: 11 units, Glucose 351 - 400: 15 units, Glucose > 400 call MD, Disp: 15 mL, Rfl: 11   Insulin Pen Needle 32G X 4 MM MISC, 1 each by Does not apply route 4 (four) times daily - after meals and at bedtime., Disp: 100 each, Rfl: 5   metFORMIN (GLUCOPHAGE-XR) 500 MG 24 hr tablet, Take 1,500 mg by mouth daily with breakfast., Disp: , Rfl:    Multiple Vitamin (MULTIVITAMIN ADULT PO), Take 1 tablet by mouth daily., Disp: , Rfl:    omeprazole (PRILOSEC) 40 MG capsule, Take 40 mg by mouth every evening., Disp: , Rfl:    potassium chloride SA (KLOR-CON  M20) 20 MEQ tablet, TAKE 1 TABLET (20 MEQ TOTAL) BY MOUTH DAILY.  (Patient taking differently: Take 20 mEq by mouth in the morning.), Disp: 30 tablet, Rfl: 5   rosuvastatin (CRESTOR) 10 MG tablet, Take 10 mg by mouth daily., Disp: , Rfl:    tadalafil (CIALIS) 5 MG tablet, Take 5 mg by mouth in the morning., Disp: , Rfl:    tamsulosin (FLOMAX) 0.4 MG CAPS capsule, Take 0.4 mg by mouth every evening., Disp: , Rfl:    terbinafine (LAMISIL) 250 MG tablet, Take 2 tablets (500 mg total) by mouth daily., Disp: 60 tablet, Rfl: 0   traMADol (ULTRAM) 50 MG tablet, Take 50 mg by mouth 3 (three) times daily as needed., Disp: , Rfl:    Zinc 50 MG TABS, Take 50 mg by mouth daily., Disp: , Rfl:    albuterol (PROAIR HFA) 108 (90 Base) MCG/ACT inhaler, Inhale 1-2 puffs into the lungs every 6 (six) hours as needed for wheezing or shortness of breath.  (Patient not taking: Reported on 08/14/2022), Disp: 8 g, Rfl: 6   alendronate (FOSAMAX) 70 MG tablet, Take 70 mg by mouth every Sunday. (Patient not taking: Reported on 08/14/2022), Disp: , Rfl:   Allergies:  Allergies  Allergen Reactions   Fexofenadine Other (See Comments)    "dries me out too much"   Pravastatin Sodium Other (See Comments)    Aches   Requip [Ropinirole Hcl] Other (See Comments)    Bad dreams   Hydrochlorothiazide Other (See Comments)    Cramps    Past Medical History, Surgical history, Social history, and Family History were reviewed and updated.  Review of Systems: Review of Systems  Constitutional: Negative.   HENT:  Negative.    Eyes: Negative.   Respiratory: Negative.    Cardiovascular: Negative.   Gastrointestinal: Negative.   Endocrine: Negative.   Genitourinary: Negative.    Musculoskeletal: Negative.   Skin: Negative.   Neurological: Negative.   Hematological: Negative.   Psychiatric/Behavioral: Negative.      Physical Exam:  height is 6' (1.829 m) and weight is 182 lb 1.9 oz (82.6 kg). His oral temperature is 98.5 F (36.9 C). His blood pressure is 107/78 and his pulse is 87. His respiration is 18 and oxygen saturation is 100%.   Wt Readings from Last 3 Encounters:  08/14/22 182 lb 1.9 oz (82.6 kg)  08/11/22 186 lb 9.6 oz (84.6 kg)  07/20/22 184 lb (83.5 kg)  His vital signs show temperature of 98.6.  Pulse 72.  Blood pressure 103/62.  Weight is 180 pounds.  Physical Exam Vitals reviewed.  HENT:     Head: Normocephalic and atraumatic.  Eyes:     Pupils: Pupils are equal, round, and reactive to light.  Cardiovascular:     Rate and Rhythm: Normal rate and regular rhythm.     Heart sounds: Normal heart sounds.  Pulmonary:     Effort: Pulmonary effort is normal.     Breath sounds: Normal breath sounds.  Abdominal:     General: Bowel sounds are normal.     Palpations: Abdomen is soft.  Musculoskeletal:        General: No tenderness or  deformity. Normal range of motion.     Cervical back: Normal range of motion.     Comments: Evaluation of the left elbow does show some fluctuance.  There is some erythema.  There is slight tenderness to palpation.  There is no exudate from the eschar.  He has decent  range of motion of the elbow.  Lymphadenopathy:     Cervical: No cervical adenopathy.  Skin:    General: Skin is warm and dry.     Findings: No erythema or rash.  Neurological:     Mental Status: He is alert and oriented to person, place, and time.  Psychiatric:        Behavior: Behavior normal.        Thought Content: Thought content normal.        Judgment: Judgment normal.      Lab Results  Component Value Date   WBC 5.7 08/14/2022   HGB 12.9 (L) 08/14/2022   HCT 36.4 (L) 08/14/2022   MCV 92.4 08/14/2022   PLT 137 (L) 08/14/2022     Chemistry      Component Value Date/Time   NA 138 08/14/2022 0836   K 4.5 08/14/2022 0836   CL 101 08/14/2022 0836   CO2 26 08/14/2022 0836   BUN 20 08/14/2022 0836   CREATININE 0.96 08/14/2022 0836      Component Value Date/Time   CALCIUM 9.5 08/14/2022 0836   ALKPHOS 53 08/14/2022 0836   AST 19 08/14/2022 0836   ALT 25 08/14/2022 0836   BILITOT 0.5 08/14/2022 0836      Impression and Plan: Aaron Weaver is a very nice 67 year old white male.  He has diffuse large cell non-Hodgkin's lymphoma.  He has extra nodal disease.  Again, he has a fairly high IPI score.  He has had 8 cycles of chemotherapy with R-CHOP.    From my point of view, he is doing quite well.  There is no evidence of recurrent non-Hodgkin's lymphoma.  We will set him up with another PET scan.  He does have a significant risk of recurrence.  He will get his Zometa today.  I will plan to have him come back in another 6 weeks or so.    Josph Macho, MD 4/8/20249:33 AM

## 2022-08-14 NOTE — Patient Instructions (Signed)

## 2022-08-22 DIAGNOSIS — J439 Emphysema, unspecified: Secondary | ICD-10-CM | POA: Diagnosis not present

## 2022-08-22 DIAGNOSIS — I10 Essential (primary) hypertension: Secondary | ICD-10-CM | POA: Diagnosis not present

## 2022-08-22 DIAGNOSIS — E1169 Type 2 diabetes mellitus with other specified complication: Secondary | ICD-10-CM | POA: Diagnosis not present

## 2022-08-22 DIAGNOSIS — E7800 Pure hypercholesterolemia, unspecified: Secondary | ICD-10-CM | POA: Diagnosis not present

## 2022-08-22 LAB — CULT, FUNGUS, SKIN,HAIR,NAIL W/KOH
MICRO NUMBER:: 14643455
SMEAR:: NONE SEEN
SPECIMEN QUALITY:: ADEQUATE

## 2022-08-22 LAB — MYCOBACTERIA,CULT W/FLUOROCHROME SMEAR
MICRO NUMBER:: 14639300
SMEAR:: NONE SEEN
SPECIMEN QUALITY:: ADEQUATE

## 2022-08-28 ENCOUNTER — Other Ambulatory Visit: Payer: Self-pay | Admitting: Pharmacist

## 2022-08-28 DIAGNOSIS — M7022 Olecranon bursitis, left elbow: Secondary | ICD-10-CM

## 2022-09-01 ENCOUNTER — Other Ambulatory Visit: Payer: Self-pay

## 2022-09-01 ENCOUNTER — Encounter: Payer: Self-pay | Admitting: Internal Medicine

## 2022-09-01 DIAGNOSIS — M7022 Olecranon bursitis, left elbow: Secondary | ICD-10-CM

## 2022-09-01 MED ORDER — TERBINAFINE HCL 250 MG PO TABS
500.0000 mg | ORAL_TABLET | Freq: Every day | ORAL | 1 refills | Status: DC
Start: 2022-09-01 — End: 2022-10-12

## 2022-09-14 ENCOUNTER — Encounter: Payer: Self-pay | Admitting: Hematology & Oncology

## 2022-09-14 ENCOUNTER — Emergency Department (HOSPITAL_BASED_OUTPATIENT_CLINIC_OR_DEPARTMENT_OTHER)
Admission: EM | Admit: 2022-09-14 | Discharge: 2022-09-14 | Disposition: A | Discharge status: Home / Self Care | Payer: Medicare Other | Attending: Emergency Medicine | Admitting: Emergency Medicine

## 2022-09-14 ENCOUNTER — Emergency Department (HOSPITAL_BASED_OUTPATIENT_CLINIC_OR_DEPARTMENT_OTHER): Payer: Medicare Other

## 2022-09-14 ENCOUNTER — Other Ambulatory Visit (HOSPITAL_BASED_OUTPATIENT_CLINIC_OR_DEPARTMENT_OTHER): Payer: Self-pay

## 2022-09-14 ENCOUNTER — Encounter: Payer: Self-pay | Admitting: Internal Medicine

## 2022-09-14 ENCOUNTER — Encounter (HOSPITAL_BASED_OUTPATIENT_CLINIC_OR_DEPARTMENT_OTHER): Payer: Self-pay | Admitting: Emergency Medicine

## 2022-09-14 ENCOUNTER — Other Ambulatory Visit: Payer: Self-pay

## 2022-09-14 DIAGNOSIS — K5730 Diverticulosis of large intestine without perforation or abscess without bleeding: Secondary | ICD-10-CM | POA: Diagnosis not present

## 2022-09-14 DIAGNOSIS — S299XXA Unspecified injury of thorax, initial encounter: Secondary | ICD-10-CM | POA: Diagnosis present

## 2022-09-14 DIAGNOSIS — W1840XA Slipping, tripping and stumbling without falling, unspecified, initial encounter: Secondary | ICD-10-CM | POA: Diagnosis not present

## 2022-09-14 DIAGNOSIS — S5002XA Contusion of left elbow, initial encounter: Secondary | ICD-10-CM | POA: Diagnosis not present

## 2022-09-14 DIAGNOSIS — Z7982 Long term (current) use of aspirin: Secondary | ICD-10-CM | POA: Insufficient documentation

## 2022-09-14 DIAGNOSIS — S2242XA Multiple fractures of ribs, left side, initial encounter for closed fracture: Secondary | ICD-10-CM | POA: Diagnosis not present

## 2022-09-14 DIAGNOSIS — S46912A Strain of unspecified muscle, fascia and tendon at shoulder and upper arm level, left arm, initial encounter: Secondary | ICD-10-CM | POA: Diagnosis not present

## 2022-09-14 DIAGNOSIS — R1032 Left lower quadrant pain: Secondary | ICD-10-CM | POA: Diagnosis not present

## 2022-09-14 LAB — COMPREHENSIVE METABOLIC PANEL
ALT: 26 U/L (ref 0–44)
AST: 20 U/L (ref 15–41)
Albumin: 4.7 g/dL (ref 3.5–5.0)
Alkaline Phosphatase: 41 U/L (ref 38–126)
Anion gap: 7 (ref 5–15)
BUN: 24 mg/dL — ABNORMAL HIGH (ref 8–23)
CO2: 25 mmol/L (ref 22–32)
Calcium: 9.2 mg/dL (ref 8.9–10.3)
Chloride: 107 mmol/L (ref 98–111)
Creatinine, Ser: 0.89 mg/dL (ref 0.61–1.24)
GFR, Estimated: >60 mL/min (ref >60)
Glucose, Bld: 192 mg/dL — ABNORMAL HIGH (ref 70–99)
Potassium: 4.7 mmol/L (ref 3.5–5.1)
Sodium: 139 mmol/L (ref 135–145)
Total Bilirubin: 0.6 mg/dL (ref 0.3–1.2)
Total Protein: 7.2 g/dL (ref 6.5–8.1)

## 2022-09-14 LAB — CBC WITH DIFFERENTIAL/PLATELET
Abs Immature Granulocytes: 0.04 10*3/uL (ref 0.00–0.07)
Basophils Absolute: 0 10*3/uL (ref 0.0–0.1)
Basophils Relative: 0 %
Eosinophils Absolute: 0 10*3/uL (ref 0.0–0.5)
Eosinophils Relative: 0 %
HCT: 35 % — ABNORMAL LOW (ref 39.0–52.0)
Hemoglobin: 12.3 g/dL — ABNORMAL LOW (ref 13.0–17.0)
Immature Granulocytes: 1 %
Lymphocytes Relative: 8 %
Lymphs Abs: 0.7 10*3/uL (ref 0.7–4.0)
MCH: 32.4 pg (ref 26.0–34.0)
MCHC: 35.1 g/dL (ref 30.0–36.0)
MCV: 92.1 fL (ref 80.0–100.0)
Monocytes Absolute: 0.8 10*3/uL (ref 0.1–1.0)
Monocytes Relative: 9 %
Neutro Abs: 7.3 10*3/uL (ref 1.7–7.7)
Neutrophils Relative %: 82 %
Platelets: 122 10*3/uL — ABNORMAL LOW (ref 150–400)
RBC: 3.8 MIL/uL — ABNORMAL LOW (ref 4.22–5.81)
RDW: 13.8 % (ref 11.5–15.5)
WBC: 8.8 10*3/uL (ref 4.0–10.5)
nRBC: 0 % (ref 0.0–0.2)

## 2022-09-14 MED ORDER — HYDROCODONE-ACETAMINOPHEN 5-325 MG PO TABS
1.0000 | ORAL_TABLET | ORAL | 0 refills | Status: AC | PRN
  Filled 2022-09-14: qty 15, 3d supply, fill #0

## 2022-09-14 MED ORDER — SODIUM CHLORIDE 0.9 % IV BOLUS
500.0000 mL | Freq: Once | INTRAVENOUS | Status: AC
  Administered 2022-09-14: 500 mL via INTRAVENOUS

## 2022-09-14 MED ORDER — HYDROCODONE-ACETAMINOPHEN 5-325 MG PO TABS
1.0000 | ORAL_TABLET | Freq: Once | ORAL | Status: AC
  Administered 2022-09-14: 1 via ORAL
  Filled 2022-09-14: qty 1

## 2022-09-14 MED ORDER — IOHEXOL 300 MG/ML  SOLN
100.0000 mL | Freq: Once | INTRAMUSCULAR | Status: AC | PRN
  Administered 2022-09-14: 100 mL via INTRAVENOUS

## 2022-09-14 NOTE — ED Provider Notes (Signed)
[place] [facility] Provider Note   CSN: [id number] Arrival date & time: 09/14/22  1028     History  Chief Complaint  Patient presents with   Rib Injury   Aaron Starch Sr. is a 67 y.o. male.  HPI 67 year old male presents from urgent care for CT scan.  This morning around 7 AM he tripped in his house and hit his woodstove on his left side.  He has a small wound to his left arm that did not need sutures at urgent care and also injured his left ribs.  He broke ribs 4-9 that appear displaced on an x-ray.  He was sent here due to concern for spleen.  Patient denies any abdominal pain.  He is on baby aspirin but no other blood thinners.  Did not hit his head and has no neck or back pain.  He has not received anything for pain and does have some significant pain on that side.  He has chronic elbow pain and swelling but an x-ray was reportedly unremarkable at the urgent care.  Home Medications Prior to Admission medications   Medication Sig Start Date End Date Taking? Authorizing Provider  HYDROcodone-acetaminophen (NORCO) 5-325 MG tablet Take 1 tablet by mouth every 4 (four) hours as needed for severe pain. 09/14/22  Yes Pricilla Loveless, MD  albuterol (PROAIR HFA) 108 (90 Base) MCG/ACT inhaler Inhale 1-2 puffs into the lungs every 6 (six) hours as needed for wheezing or shortness of breath. Patient not taking: Reported on 08/14/2022 10/15/20   Leslye Peer, MD  alendronate (FOSAMAX) 70 MG tablet Take 70 mg by mouth every Sunday. Patient not taking: Reported on 08/14/2022 02/12/20   [provider]  aspirin EC 81 MG EC tablet Take 1 tablet (81 mg total) by mouth daily at 6 (six) AM. Swallow whole. Patient taking differently: Take 81 mg by mouth in the morning. Swallow whole. 07/14/20   Lars Mage, PA-C  atenolol (TENORMIN) 25 MG tablet Take 25 mg by mouth daily. 01/27/22   [provider]  blood glucose meter kit and supplies KIT  Dispense based on patient and insurance preference. Use up to four times daily as directed. 06/08/21   Lewie Chamber, MD  calcium carbonate (OSCAL) 1500 (600 Ca) MG TABS tablet Take 600 mg of elemental calcium by mouth daily with breakfast.    [provider]  Continuous Blood Gluc Receiver (FREESTYLE LIBRE 2 READER) DEVI  06/16/21   [provider]  Continuous Blood Gluc Sensor (FREESTYLE LIBRE 14 DAY SENSOR) MISC Inject 1 Device into the skin every 14 (fourteen) days.    [provider]  diclofenac Sodium (VOLTAREN) 1 % GEL Apply 2 g topically daily as needed (to painful sites).    [provider]  DULoxetine (CYMBALTA) 60 MG capsule Take 60 mg by mouth at bedtime. 04/01/18   [provider]  Fish Oil OIL Take 1,500 mg by mouth daily.    [provider]  insulin glargine (LANTUS) 100 UNIT/ML Solostar Pen Inject 25 Units into the skin daily. Patient taking differently: Inject 45 Units into the skin daily. 06/30/21   Kathlen Mody, MD  insulin lispro (HUMALOG) 100 UNIT/ML KwikPen Inject 2-15 Units into the skin 4 (four) times daily - after meals and at bedtime. Glucose 121 - 150: 2 units, Glucose 151 - 200: 3 units, Glucose 201 - 250: 5 units, Glucose 251 - 300: 8 units, Glucose 301 -  350: 11 units, Glucose 351 - 400: 15 units, Glucose > 400 call MD 06/08/21   Lewie Chamber, MD  Insulin Pen Needle 32G X 4 MM MISC 1 each by Does not apply route 4 (four) times daily - after meals and at bedtime. 06/08/21   Lewie Chamber, MD  metFORMIN (GLUCOPHAGE-XR) 500 MG 24 hr tablet Take 1,500 mg by mouth daily with breakfast. 06/14/22   [provider]  Multiple Vitamin (MULTIVITAMIN ADULT PO) Take 1 tablet by mouth daily.    [provider]  omeprazole (PRILOSEC) 40 MG capsule Take 40 mg by mouth every evening.    [provider]  potassium chloride SA (KLOR-CON M20) 20 MEQ tablet TAKE 1 TABLET (20 MEQ TOTAL) BY MOUTH DAILY.  Patient taking  differently: Take 20 mEq by mouth in the morning. 02/13/13   Plotnikov, Georgina Quint, MD  rosuvastatin (CRESTOR) 10 MG tablet Take 10 mg by mouth daily. 03/16/20   [provider]  tadalafil (CIALIS) 5 MG tablet Take 5 mg by mouth in the morning.    [provider]  tamsulosin (FLOMAX) 0.4 MG CAPS capsule Take 0.4 mg by mouth every evening.    [provider]  terbinafine (LAMISIL) 250 MG tablet Take 2 tablets (500 mg total) by mouth daily. 09/01/22   Comer, Belia Heman, MD  traMADol (ULTRAM) 50 MG tablet Take 50 mg by mouth 3 (three) times daily as needed. 05/18/22   [provider]  Zinc 50 MG TABS Take 50 mg by mouth daily.    [provider]  prochlorperazine (COMPAZINE) 10 MG tablet Take 1 tablet (10 mg total) by mouth every 6 (six) hours as needed (Nausea or vomiting). 05/17/21 11/04/21  Josph Macho, MD      Allergies    Fexofenadine, Pravastatin sodium, Requip [ropinirole hcl], and Hydrochlorothiazide    Review of Systems   Review of Systems  Respiratory:  Negative for shortness of breath.   Cardiovascular:  Positive for chest pain.  Gastrointestinal:  Negative for abdominal pain.  Neurological:  Negative for headaches.    Physical Exam Updated Vital Signs BP (!) 150/102   Pulse 89   Temp 97.9 F (36.6 C) (Oral)   Resp 18   Ht 6' (1.829 m)   Wt 82.6 kg   SpO2 99%   BMI 24.68 kg/m  Physical Exam Vitals and nursing note reviewed.  Constitutional:      General: He is not in acute distress.    Appearance: He is well-developed. He is not ill-appearing or diaphoretic.  HENT:     Head: Normocephalic and atraumatic.  Cardiovascular:     Rate and Rhythm: Normal rate and regular rhythm.     Heart sounds: Normal heart sounds.  Pulmonary:     Effort: Pulmonary effort is normal.     Breath sounds: Normal breath sounds.  Chest:     Chest wall: Tenderness (left sided) present.  Abdominal:     General: There is no distension.      Palpations: Abdomen is soft.     Tenderness: There is no abdominal tenderness.  Musculoskeletal:     Comments: Left elbow has some mild swelling over the olecranon.  There is also small abrasions.  He has a linear but superficial abrasion to his distal upper arm that does not appear to need sutures.  Skin:    General: Skin is warm and dry.  Neurological:     Mental Status: He is alert.  ED Results / Procedures / Treatments   Labs (all labs ordered are listed, but only abnormal results are displayed) Labs Reviewed  COMPREHENSIVE METABOLIC PANEL - Abnormal; Notable for the following components:      Result Value   Glucose, Bld 192 (*)    BUN 24 (*)    All other components within normal limits  CBC WITH DIFFERENTIAL/PLATELET - Abnormal; Notable for the following components:   RBC 3.80 (*)    Hemoglobin 12.3 (*)    HCT 35.0 (*)    Platelets 122 (*)    All other components within normal limits    EKG None  Radiology CT CHEST ABDOMEN PELVIS W CONTRAST  Result Date: 09/14/2022 CLINICAL DATA:  Fall on would still this morning with left-sided rib pain. Has fractured ribs. History of diffuse B-cell lymphoma. EXAM: CT CHEST, ABDOMEN, AND PELVIS WITH CONTRAST TECHNIQUE: Multidetector CT imaging of the chest, abdomen and pelvis was performed following the standard protocol during bolus administration of intravenous contrast. RADIATION DOSE REDUCTION: This exam was performed according to the departmental dose-optimization program which includes automated exposure control, adjustment of the mA and/or kV according to patient size and/or use of iterative reconstruction technique. CONTRAST:  OMNIPAQUE IOHEXOL 300 MG/ML  SOLN COMPARISON:  PET-CT 06/29/2022. Standard CT 06/28/2021. Other comparisons. FINDINGS: CT CHEST FINDINGS Cardiovascular: Right upper chest port. The heart is nonenlarged. No pericardial effusion. Coronary artery calcifications are seen. The thoracic aorta has a normal  course and caliber with mild calcified atherosclerotic plaque. Slight tortuosity of the distal descending thoracic aorta. Slight pulsation artifact along the ascending aorta but no mediastinal hematoma. Mediastinum/Nodes: Thyroid gland is unremarkable. Normal caliber thoracic esophagus. Surgical clips in the right axillary region. No specific abnormal lymph node enlargement seen in the axillary regions, hilum or mediastinum. Lungs/Pleura: There is some linear opacity lung bases likely scar or atelectasis. No consolidation, pneumothorax or effusion. There is a ground-glass nodule in the superior aspect of the middle lobe which has been present since at least June 2021 demonstrating long-term stability. No additional follow-up. Scattered emphysematous changes identified as well including some paraseptal changes and mild centrilobular changes. Scattered areas of scarring and fibrotic changes of the lungs as well. Musculoskeletal: Multiple rib fractures are identified. Many of which are old. These include some non healed fractures along the lower right-sided ribs. There are however some acute rib fractures also seen on the left side including the lateral aspect of the left third, fourth, fifth, sixth ribs. There is also an acute fracture of the posterior aspect of the left third, fourth, fifth, sixth, seventh, ninth ribs. Please correlate for any clinical evidence of flail chest. Trace left-sided extrapleural thickening or hematoma. Again no left-sided pneumothorax. CT ABDOMEN PELVIS FINDINGS Hepatobiliary: No focal liver abnormality is seen. No gallstones, gallbladder wall thickening, or biliary dilatation. Pancreas: Mild pancreatic atrophy. Few punctate calcifications in the pancreas. Please correlate for any history of chronic calcific pancreatitis. Spleen: Spleen is nonenlarged. Adrenals/Urinary Tract: Small area of macroscopic fat in the right adrenal gland. 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 Left adrenal  gland also has some areas that are similar. Mild bilateral renal atrophy. Stable ill-defined low-density soft tissue thickening along the hilum of the left kidney. Preserved contours of the urinary bladder. Stomach/Bowel: Large bowel is nondilated. Normal appendix. Few diverticula. There is some low-density along the wall of the colon diffusely. Please correlate for any chronic inflammatory states. Stomach and small bowel are nondilated. Third portion duodenal diverticulum. 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 Aneurysm overall on the prior had dimensions of 4.2 x 3.3 cm and today 4.3 by 3.3 cm, not significantly changed. Normal caliber IVC. No specific abnormal lymph node enlargement identified in the abdomen and pelvis. The tissue, node lateral to the left common iliac artery which measured 15 mm in AP dimension on the prior, today measures 13 mm on series 2, image 95. Reproductive: Prominent prostate. Please correlate for BPH changes. Mild mass effect along the base of the bladder. Other: No free air or free fluid.  Slight anasarca. Musculoskeletal: Degenerative changes of the spine and pelvis. Compression of T12 with augmentation cement. Multilevel Schmorl's node changes. IMPRESSION: Multiple left-sided rib fractures. There are several which are fractured in more than one location. Please correlate for any clinical evidence of flail chest. Slight adjacent extrapleural thickening versus tiny hematoma. No pleural effusion or pneumothorax. No bowel obstruction, free air or free fluid. No evidence of solid organ injury. Aortic endograft with stable aneurysm. Few colonic diverticula. Stable low-density soft tissue thickening along the left renal hilum. Slight decrease in the prominent left common iliac chain lymph node. Electronically Signed   By: Karen Kays M.D.   On: 09/14/2022 13:24    Procedures Procedures    Medications Ordered in ED Medications  sodium chloride 0.9 % bolus 500 mL (0 mLs  Intravenous Stopped 09/14/22 1344)  HYDROcodone-acetaminophen (NORCO/VICODIN) 5-325 MG per tablet 1 tablet (1 tablet Oral Given 09/14/22 1129)  iohexol (OMNIPAQUE) 300 MG/ML solution 100 mL (100 mLs Intravenous Contrast Given 09/14/22 1228)    ED Course/ Medical Decision Making/ A&P                             Medical Decision Making Amount and/or Complexity of Data Reviewed Labs: ordered.    Details: Chronic anemia, unchanged.  Normal WBC. Radiology: ordered.    Details: Left-sided rib fractures but no pneumothorax or splenic injury.   Risk Prescription drug management.   Patient was sent from urgent care for possible splenic injury.  CT is unrevealing but does show multiple rib factures, which we knew about.  However despite these multiple fractures, he feels well with some hydrocodone and feels like he can manage this pain at home.  I think that is reasonable and I will give him an incentive spirometer and a short course of Norco and recommend he also take some Tylenol and ibuprofen.  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  He also has a very superficial injury to his arm and we will treat with local wound care.  Otherwise appears stable for discharge home with return precautions.        Final Clinical Impression(s) / ED Diagnoses Final diagnoses:  Closed fracture of multiple ribs of left side, initial encounter    Rx / DC Orders ED Discharge Orders          Ordered    HYDROcodone-acetaminophen (NORCO) 5-325 MG tablet  Every 4 hours PRN        09/14/22 1346              Pricilla Loveless, MD 09/14/22 1557

## 2022-09-14 NOTE — ED Notes (Signed)
Discharge paperwork reviewed entirely with patient, including follow up care. Pain was under control. The patient received instruction and coaching on their prescriptions, and all follow-up questions were answered.  Pt verbalized understanding as well as all parties involved. No questions or concerns voiced at the time of discharge. No acute distress noted.   Pt ambulated out to PVA without incident or assistance.  

## 2022-09-14 NOTE — ED Triage Notes (Signed)
Patient arrives ambulatory by POV c/o left rib cage pain. Patient was seen at [facility] and diagnosed with rib fractures from 4th through 9th. Patient was sent here for CT scan. Patient states he fell onto wood stove this morning. Also having left elbow pain that had x-ray done and was negative.

## 2022-09-14 NOTE — Discharge Instructions (Addendum)
You are being prescribed for your broken ribs.  You will also need to use the incentive spirometer to help keep your lungs clear and to help prevent pneumonia during this time.  You should also take Tylenol as well as ibuprofen to help with pain.  Have caution as the hydrocodone also has some Tylenol in it.  If you develop fever, cough, trouble breathing, new or worsening pain, or any other new/concerning symptoms and return to the ER or call 911.

## 2022-09-19 ENCOUNTER — Other Ambulatory Visit: Payer: Self-pay

## 2022-09-19 ENCOUNTER — Encounter: Payer: Self-pay | Admitting: Internal Medicine

## 2022-09-19 ENCOUNTER — Ambulatory Visit: Payer: Medicare Other | Admitting: Internal Medicine

## 2022-09-19 VITALS — BP 144/97 | HR 87 | Temp 98.0°F | Wt 185.0 lb

## 2022-09-19 DIAGNOSIS — M7022 Olecranon bursitis, left elbow: Secondary | ICD-10-CM | POA: Diagnosis not present

## 2022-09-19 NOTE — Assessment & Plan Note (Addendum)
It continues to heal with a bit of a set back after the fall.  I also reviewed his recent lab results.   At this point, I will have him continue with terbinafine and complete 2 months, now on his refill, and then stop.  He will then follow up with me after completing and see how it is doing.    I have personally spent 33 minutes involved in face-to-face and non-face-to-face activities for this patient on the day of the visit. Professional time spent includes the following activities: Preparing to see the patient (review of tests), Obtaining and/or reviewing separately obtained history (admission/discharge record), Performing a medically appropriate examination and/or evaluation , Ordering medications/tests/procedures, referring and communicating with other health care professionals, Documenting clinical information in the EMR, Independently interpreting results (not separately reported), Communicating results to the patient/family/caregiver, Counseling and educating the patient/family/caregiver and Care coordination (not separately reported).

## 2022-09-19 NOTE — Progress Notes (Signed)
   Subjective:    Patient ID: Aaron Motto Sr., male    DOB: 10/04/1955, 67 y.o.   MRN: [id number]  HPI [name] is here in regards to his chronic left elbow infection.  He has had multiple treatments with little benefit and culture ultimately have grown out fungal organisms.  Itraconazole was declined by his insurance and now started on terbenafine.  He unfortunately had a fall and landed on his elbow and developed a large hematoma and it opened up again.  No pus.  Broke several ribs.     Review of Systems  Constitutional:  Negative for fever.  Gastrointestinal:  Negative for diarrhea and nausea.  Skin:  Negative for rash.       Objective:   Physical Exam Eyes:     General: No scleral icterus. Pulmonary:     Effort: Pulmonary effort is normal.  Musculoskeletal:     Comments: Left elbow with a scab over it.  No warmth, no erythema.   Neurological:     Mental Status: He is alert.           Assessment & Plan:

## 2022-09-25 DIAGNOSIS — S2242XD Multiple fractures of ribs, left side, subsequent encounter for fracture with routine healing: Secondary | ICD-10-CM | POA: Diagnosis not present

## 2022-09-26 DIAGNOSIS — J439 Emphysema, unspecified: Secondary | ICD-10-CM | POA: Diagnosis not present

## 2022-09-26 DIAGNOSIS — E1169 Type 2 diabetes mellitus with other specified complication: Secondary | ICD-10-CM | POA: Diagnosis not present

## 2022-09-26 DIAGNOSIS — E7800 Pure hypercholesterolemia, unspecified: Secondary | ICD-10-CM | POA: Diagnosis not present

## 2022-09-26 DIAGNOSIS — I10 Essential (primary) hypertension: Secondary | ICD-10-CM | POA: Diagnosis not present

## 2022-09-26 DIAGNOSIS — I480 Paroxysmal atrial fibrillation: Secondary | ICD-10-CM | POA: Diagnosis not present

## 2022-10-05 ENCOUNTER — Encounter (HOSPITAL_COMMUNITY)
Admission: RE | Admit: 2022-10-05 | Discharge: 2022-10-05 | Disposition: A | Discharge status: Home / Self Care | Payer: Medicare Other | Source: Ambulatory Visit | Attending: Hematology & Oncology | Admitting: Hematology & Oncology

## 2022-10-05 DIAGNOSIS — C8338 Diffuse large B-cell lymphoma, lymph nodes of multiple sites: Secondary | ICD-10-CM | POA: Diagnosis not present

## 2022-10-05 DIAGNOSIS — C8330 Diffuse large B-cell lymphoma, unspecified site: Secondary | ICD-10-CM | POA: Diagnosis not present

## 2022-10-05 LAB — GLUCOSE, CAPILLARY: Glucose-Capillary: 218 mg/dL — ABNORMAL HIGH (ref 70–99)

## 2022-10-05 MED ORDER — FLUDEOXYGLUCOSE F - 18 (FDG) INJECTION
9.3000 | Freq: Once | INTRAVENOUS | Status: AC | PRN
  Administered 2022-10-05: 9.3 via INTRAVENOUS

## 2022-10-09 ENCOUNTER — Telehealth: Payer: Self-pay

## 2022-10-09 NOTE — Telephone Encounter (Signed)
Received phone call from patient wondering about his PET scan results and next appointment. Pt aware that his PET scan is not resulted yet and we will send a MyChart message or call with the results. Pt aware that scheduling will call to schedule an appointment for next week. Pt verbalized understanding and had no further questions. Pt appreciative of call.

## 2022-10-11 ENCOUNTER — Other Ambulatory Visit: Payer: Self-pay | Admitting: Hematology & Oncology

## 2022-10-11 DIAGNOSIS — C8338 Diffuse large B-cell lymphoma, lymph nodes of multiple sites: Secondary | ICD-10-CM

## 2022-10-12 ENCOUNTER — Encounter: Payer: Self-pay | Admitting: Internal Medicine

## 2022-10-12 ENCOUNTER — Other Ambulatory Visit: Payer: Self-pay

## 2022-10-12 ENCOUNTER — Ambulatory Visit: Payer: Medicare Other | Admitting: Internal Medicine

## 2022-10-12 DIAGNOSIS — M7022 Olecranon bursitis, left elbow: Secondary | ICD-10-CM

## 2022-10-12 MED ORDER — TERBINAFINE HCL 250 MG PO TABS: 500.0000 mg | ORAL_TABLET | Freq: Every day | ORAL | 2 refills | Status: DC

## 2022-10-12 NOTE — Progress Notes (Signed)
   Subjective:    Patient ID: Aaron Motto Sr., male    DOB: 06-29-1955, 67 y.o.   MRN: [id number]  HPI [name] is here for follow up of left elbow olecranon bursitis. He has had a prolonged course and saw some benefit with terbenafine and stopped after 2 months, completing one week ago.  He still notes some occasional pus drainage out of the area and it continues to hurt.  Picks at scab since it hurts and bothers him to move his elbow.    Review of Systems  Constitutional:  Negative for chills and fever.  Gastrointestinal:  Negative for diarrhea.  Skin:  Negative for rash.       Objective:   Physical Exam Eyes:     General: No scleral icterus. Skin:    Findings: No rash.  Neurological:     Mental Status: He is alert.   SH: No tobacco        Assessment & Plan:

## 2022-10-12 NOTE — Assessment & Plan Note (Signed)
Some progress overall and it seems some of the frustration is it not completely healing which has been complicated by his recent fall and him removing the scab due to the discomfort.  I have recommended he leave the area alone, allow the scab to remain and restart the terbinafine 2-3 more months.   Will have him return in about 6 weeks and recheck and will need to conitnue to monitor this for a prolonged period.    I have personally spent 30 minutes involved in face-to-face and non-face-to-face activities for this patient on the day of the visit. Professional time spent includes the following activities: Preparing to see the patient (review of tests), Obtaining and/or reviewing separately obtained history (admission/discharge record), Performing a medically appropriate examination and/or evaluation , Ordering medications/tests/procedures, referring and communicating with other health care professionals, Documenting clinical information in the EMR, Independently interpreting results (not separately reported), Communicating results to the patient/family/caregiver, Counseling and educating the patient/family/caregiver and Care coordination (not separately reported).

## 2022-10-18 ENCOUNTER — Encounter: Payer: Self-pay | Admitting: Hematology & Oncology

## 2022-10-18 ENCOUNTER — Other Ambulatory Visit: Payer: Self-pay

## 2022-10-18 ENCOUNTER — Inpatient Hospital Stay (HOSPITAL_BASED_OUTPATIENT_CLINIC_OR_DEPARTMENT_OTHER): Payer: Medicare Other | Admitting: Hematology & Oncology

## 2022-10-18 ENCOUNTER — Inpatient Hospital Stay: Payer: Medicare Other

## 2022-10-18 ENCOUNTER — Inpatient Hospital Stay: Payer: Medicare Other | Attending: Physician Assistant

## 2022-10-18 VITALS — BP 97/74 | HR 94 | Temp 98.0°F | Resp 18 | Ht 72.0 in | Wt 180.0 lb

## 2022-10-18 DIAGNOSIS — C8338 Diffuse large B-cell lymphoma, lymph nodes of multiple sites: Secondary | ICD-10-CM | POA: Diagnosis not present

## 2022-10-18 DIAGNOSIS — S2249XS Multiple fractures of ribs, unspecified side, sequela: Secondary | ICD-10-CM

## 2022-10-18 DIAGNOSIS — C8330 Diffuse large B-cell lymphoma, unspecified site: Secondary | ICD-10-CM | POA: Diagnosis not present

## 2022-10-18 DIAGNOSIS — Z9221 Personal history of antineoplastic chemotherapy: Secondary | ICD-10-CM | POA: Diagnosis not present

## 2022-10-18 DIAGNOSIS — M25552 Pain in left hip: Secondary | ICD-10-CM

## 2022-10-18 LAB — CMP (CANCER CENTER ONLY)
ALT: 17 U/L (ref 0–44)
AST: 15 U/L (ref 15–41)
Albumin: 4.3 g/dL (ref 3.5–5.0)
Alkaline Phosphatase: 73 U/L (ref 38–126)
Anion gap: 11 (ref 5–15)
BUN: 27 mg/dL — ABNORMAL HIGH (ref 8–23)
CO2: 24 mmol/L (ref 22–32)
Calcium: 10 mg/dL (ref 8.9–10.3)
Chloride: 103 mmol/L (ref 98–111)
Creatinine: 0.96 mg/dL (ref 0.61–1.24)
GFR, Estimated: >60 mL/min (ref >60)
Glucose, Bld: 261 mg/dL — ABNORMAL HIGH (ref 70–99)
Potassium: 4.7 mmol/L (ref 3.5–5.1)
Sodium: 138 mmol/L (ref 135–145)
Total Bilirubin: 0.4 mg/dL (ref 0.3–1.2)
Total Protein: 6.9 g/dL (ref 6.5–8.1)

## 2022-10-18 LAB — CBC WITH DIFFERENTIAL (CANCER CENTER ONLY)
Abs Immature Granulocytes: 0.04 10*3/uL (ref 0.00–0.07)
Basophils Absolute: 0.1 10*3/uL (ref 0.0–0.1)
Basophils Relative: 1 %
Eosinophils Absolute: 0.1 10*3/uL (ref 0.0–0.5)
Eosinophils Relative: 1 %
HCT: 36.7 % — ABNORMAL LOW (ref 39.0–52.0)
Hemoglobin: 12.5 g/dL — ABNORMAL LOW (ref 13.0–17.0)
Immature Granulocytes: 1 %
Lymphocytes Relative: 27 %
Lymphs Abs: 1.9 10*3/uL (ref 0.7–4.0)
MCH: 32 pg (ref 26.0–34.0)
MCHC: 34.1 g/dL (ref 30.0–36.0)
MCV: 93.9 fL (ref 80.0–100.0)
Monocytes Absolute: 0.7 10*3/uL (ref 0.1–1.0)
Monocytes Relative: 10 %
Neutro Abs: 4.3 10*3/uL (ref 1.7–7.7)
Neutrophils Relative %: 60 %
Platelet Count: 169 10*3/uL (ref 150–400)
RBC: 3.91 MIL/uL — ABNORMAL LOW (ref 4.22–5.81)
RDW: 13.5 % (ref 11.5–15.5)
WBC Count: 7.1 10*3/uL (ref 4.0–10.5)
nRBC: 0 % (ref 0.0–0.2)

## 2022-10-18 LAB — LACTATE DEHYDROGENASE: LDH: 121 U/L (ref 98–192)

## 2022-10-18 MED ORDER — HEPARIN SOD (PORK) LOCK FLUSH 100 UNIT/ML IV SOLN
500.0000 [IU] | Freq: Once | INTRAVENOUS | Status: AC | PRN
  Administered 2022-10-18: 500 [IU]

## 2022-10-18 MED ORDER — SODIUM CHLORIDE 0.9% FLUSH
10.0000 mL | Freq: Once | INTRAVENOUS | Status: AC | PRN
  Administered 2022-10-18: 10 mL

## 2022-10-18 NOTE — Progress Notes (Signed)
Hematology and Oncology Follow Up Visit  Aaron Weaver Sr. [id number] [birth year] 67 y.o. 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  He is doing okay.  He is pretty much doing what he likes to do.  However, no problem with his that he had a recent PET scan.  We really not sure how to interpret the PET scan.  The radiologist commented that there may be a residual or recurrent disease in the left lower renal pelvis.  This had SUV of 36.6.  However, the radiologist said that it was difficult to distinguish from excretory radiotracer.  I would think that be a little unusual that he would have a solitary recurrence in the left renal pelvis.  Otherwise, the PET scan looked fantastic.  He has had no problems with nausea or vomiting.  He has had no cough or shortness of breath.  His blood sugars are still not that great.  Today, his blood sugar was 267.  He has had no problems with COVID.  There is no change in bowel or bladder habits.  He is urinating without difficulty.  He has had no headache.  Currently, I would say that his performance status is probably ECOG 1.   Medications:  Current Outpatient Medications:    albuterol (PROAIR HFA) 108 (90 Base) MCG/ACT inhaler, Inhale 1-2 puffs into the lungs every 6 (six) hours as needed for wheezing or shortness of breath., Disp: 8 g, Rfl: 6   alendronate (FOSAMAX) 70 MG tablet, Take 70 mg by mouth every Sunday., Disp: , Rfl:    aspirin EC 81 MG EC tablet, Take 1 tablet (81 mg total) by mouth daily at 6 (six) AM. Swallow whole. (Patient taking differently: Take 81 mg by mouth in the morning. Swallow whole.), Disp: 30 tablet, Rfl: 11   atenolol (TENORMIN) 25 MG tablet, Take 25 mg by mouth daily., Disp: , Rfl:    blood  glucose meter kit and supplies KIT, Dispense based on patient and insurance preference. Use up to four times daily as directed., Disp: 1 each, Rfl: 0   calcium carbonate (OSCAL) 1500 (600 Ca) MG TABS tablet, Take 600 mg of elemental calcium by mouth daily with breakfast., Disp: , Rfl:    Continuous Blood Gluc Receiver (FREESTYLE LIBRE 2 READER) DEVI, , Disp: , Rfl:    Continuous Blood Gluc Sensor (FREESTYLE LIBRE 14 DAY SENSOR) MISC, Inject 1 Device into the skin every 14 (fourteen) days., Disp: , Rfl:    diclofenac Sodium (VOLTAREN) 1 % GEL, Apply 2 g topically daily as needed (to painful sites)., Disp: , Rfl:    DULoxetine (CYMBALTA) 60 MG capsule, Take 60 mg by mouth at bedtime., Disp: , Rfl:    Fish Oil OIL, Take 1,500 mg by mouth daily., Disp: , Rfl:    fluconazole (DIFLUCAN) 200 MG tablet, Take 200 mg by mouth once., Disp: , Rfl:    HYDROcodone-acetaminophen (NORCO) 5-325 MG tablet, Take 1 tablet by mouth every 4 (four) hours as needed for severe pain., Disp: 15 tablet, Rfl: 0   insulin glargine (LANTUS) 100 UNIT/ML Solostar Pen, Inject 25 Units into the skin daily. (Patient taking differently: Inject 45 Units into the skin daily.), Disp: 15 mL, Rfl: 11  insulin lispro (HUMALOG) 100 UNIT/ML KwikPen, Inject 2-15 Units into the skin 4 (four) times daily - after meals and at bedtime. Glucose 121 - 150: 2 units, Glucose 151 - 200: 3 units, Glucose 201 - 250: 5 units, Glucose 251 - 300: 8 units, Glucose 301 - 350: 11 units, Glucose 351 - 400: 15 units, Glucose > 400 call MD, Disp: 15 mL, Rfl: 11   Insulin Pen Needle 32G X 4 MM MISC, 1 each by Does not apply route 4 (four) times daily - after meals and at bedtime., Disp: 100 each, Rfl: 5   lisinopril (ZESTRIL) 5 MG tablet, Take 5 mg by mouth daily., Disp: , Rfl:    metFORMIN (GLUCOPHAGE-XR) 500 MG 24 hr tablet, Take 1,500 mg by mouth daily with breakfast., Disp: , Rfl:    Multiple Vitamin (MULTIVITAMIN ADULT PO), Take 1 tablet by mouth daily., Disp: ,  Rfl:    omeprazole (PRILOSEC) 40 MG capsule, Take 40 mg by mouth every evening., Disp: , Rfl:    potassium chloride SA (KLOR-CON M20) 20 MEQ tablet, TAKE 1 TABLET (20 MEQ TOTAL) BY MOUTH DAILY.  (Patient taking differently: Take 20 mEq by mouth in the morning.), Disp: 30 tablet, Rfl: 5   rosuvastatin (CRESTOR) 10 MG tablet, Take 10 mg by mouth daily., Disp: , Rfl:    tadalafil (CIALIS) 5 MG tablet, Take 5 mg by mouth in the morning., Disp: , Rfl:    tamsulosin (FLOMAX) 0.4 MG CAPS capsule, Take 0.4 mg by mouth every evening., Disp: , Rfl:    terbinafine (LAMISIL) 250 MG tablet, Take 2 tablets (500 mg total) by mouth daily., Disp: 60 tablet, Rfl: 2   tiZANidine (ZANAFLEX) 4 MG tablet, Take 4 mg by mouth 2 (two) times daily as needed., Disp: , Rfl:    traMADol (ULTRAM) 50 MG tablet, Take 50 mg by mouth 3 (three) times daily as needed., Disp: , Rfl:    Zinc 50 MG TABS, Take 50 mg by mouth daily., Disp: , Rfl:   Allergies:  Allergies  Allergen Reactions   Fexofenadine Other (See Comments)    "dries me out too much"   Pravastatin Sodium Other (See Comments)    Aches   Requip [Ropinirole Hcl] Other (See Comments)    Bad dreams   Hydrochlorothiazide Other (See Comments)    Cramps    Past Medical History, Surgical history, Social history, and Family History were reviewed and updated.  Review of Systems: Review of Systems  Constitutional: Negative.   HENT:  Negative.    Eyes: Negative.   Respiratory: Negative.    Cardiovascular: Negative.   Gastrointestinal: Negative.   Endocrine: Negative.   Genitourinary: Negative.    Musculoskeletal: Negative.   Skin: Negative.   Neurological: Negative.   Hematological: Negative.   Psychiatric/Behavioral: Negative.      Physical Exam:  height is 6' (1.829 m) and weight is 180 lb (81.6 kg). His oral temperature is 98 F (36.7 C). His blood pressure is 97/74 and his pulse is 94. His respiration is 18 and oxygen saturation is 98%.   Wt Readings  from Last 3 Encounters:  10/18/22 180 lb (81.6 kg)  10/12/22 181 lb (82.1 kg)  09/19/22 185 lb (83.9 kg)    Physical Exam Vitals reviewed.  HENT:     Head: Normocephalic and atraumatic.  Eyes:     Pupils: Pupils are equal, round, and reactive to light.  Cardiovascular:     Rate and Rhythm: Normal rate and regular rhythm.  Heart sounds: Normal heart sounds.  Pulmonary:     Effort: Pulmonary effort is normal.     Breath sounds: Normal breath sounds.  Abdominal:     General: Bowel sounds are normal.     Palpations: Abdomen is soft.  Musculoskeletal:        General: No tenderness or deformity. Normal range of motion.     Cervical back: Normal range of motion.     Comments: Evaluation of the left elbow does show some fluctuance.  There is some erythema.  There is slight tenderness to palpation.  There is no exudate from the eschar.  He has decent range of motion of the elbow.  Lymphadenopathy:     Cervical: No cervical adenopathy.  Skin:    General: Skin is warm and dry.     Findings: No erythema or rash.  Neurological:     Mental Status: He is alert and oriented to person, place, and time.  Psychiatric:        Behavior: Behavior normal.        Thought Content: Thought content normal.        Judgment: Judgment normal.     Lab Results  Component Value Date   WBC 7.1 10/18/2022   HGB 12.5 (L) 10/18/2022   HCT 36.7 (L) 10/18/2022   MCV 93.9 10/18/2022   PLT 169 10/18/2022     Chemistry      Component Value Date/Time   NA 138 10/18/2022 1420   K 4.7 10/18/2022 1420   CL 103 10/18/2022 1420   CO2 24 10/18/2022 1420   BUN 27 (H) 10/18/2022 1420   CREATININE 0.96 10/18/2022 1420      Component Value Date/Time   CALCIUM 10.0 10/18/2022 1420   ALKPHOS 73 10/18/2022 1420   AST 15 10/18/2022 1420   ALT 17 10/18/2022 1420   BILITOT 0.4 10/18/2022 1420      Impression and Plan: Mr. Maybank is a very nice 67 year old white male.  He has diffuse large cell  non-Hodgkin's lymphoma.  He has extra nodal disease.  Again, he has a fairly high IPI score.  He has had 8 cycles of chemotherapy with R-CHOP.    Will get have to do another PET scan on him.  Will set 1 up for July.  I think this would be reasonable.  Since this is only potential area of recurrence/residual disease, we may have to think about radiation therapy.  I would like to hope that this is not going to be an issue with respect to his disease.  I realize that he had a fairly aggressive lymphoma.  However, he really did respond well to chemotherapy.  We will plan for another PET scan in about a month.  I will then see him back afterwards.  When he comes back, he will get his Zometa.    Josph Macho, MD 6/12/20243:06 PM

## 2022-11-23 ENCOUNTER — Encounter (HOSPITAL_COMMUNITY)
Admission: RE | Admit: 2022-11-23 | Discharge: 2022-11-23 | Disposition: A | Discharge status: Home / Self Care | Payer: Medicare Other | Source: Ambulatory Visit | Attending: Hematology & Oncology | Admitting: Hematology & Oncology

## 2022-11-23 DIAGNOSIS — C8338 Diffuse large B-cell lymphoma, lymph nodes of multiple sites: Secondary | ICD-10-CM | POA: Insufficient documentation

## 2022-11-23 DIAGNOSIS — C8510 Unspecified B-cell lymphoma, unspecified site: Secondary | ICD-10-CM | POA: Diagnosis not present

## 2022-11-23 LAB — GLUCOSE, CAPILLARY: Glucose-Capillary: 163 mg/dL — ABNORMAL HIGH (ref 70–99)

## 2022-11-23 MED ORDER — FLUDEOXYGLUCOSE F - 18 (FDG) INJECTION
9.0100 | Freq: Once | INTRAVENOUS | Status: AC
  Administered 2022-11-23: 9.01 via INTRAVENOUS

## 2022-11-29 ENCOUNTER — Inpatient Hospital Stay: Payer: Medicare Other

## 2022-11-29 ENCOUNTER — Inpatient Hospital Stay: Payer: Medicare Other | Admitting: Hematology & Oncology

## 2022-11-29 ENCOUNTER — Inpatient Hospital Stay: Payer: Medicare Other | Attending: Physician Assistant

## 2022-11-29 ENCOUNTER — Encounter: Payer: Self-pay | Admitting: Hematology & Oncology

## 2022-11-29 VITALS — BP 99/69 | HR 87 | Temp 98.2°F | Resp 20 | Ht 72.0 in | Wt 179.0 lb

## 2022-11-29 DIAGNOSIS — C8330 Diffuse large B-cell lymphoma, unspecified site: Secondary | ICD-10-CM | POA: Diagnosis not present

## 2022-11-29 DIAGNOSIS — C8338 Diffuse large B-cell lymphoma, lymph nodes of multiple sites: Secondary | ICD-10-CM

## 2022-11-29 DIAGNOSIS — S2249XS Multiple fractures of ribs, unspecified side, sequela: Secondary | ICD-10-CM

## 2022-11-29 DIAGNOSIS — M25552 Pain in left hip: Secondary | ICD-10-CM

## 2022-11-29 DIAGNOSIS — Z9221 Personal history of antineoplastic chemotherapy: Secondary | ICD-10-CM | POA: Diagnosis not present

## 2022-11-29 LAB — CBC WITH DIFFERENTIAL (CANCER CENTER ONLY)
Abs Immature Granulocytes: 0.01 10*3/uL (ref 0.00–0.07)
Basophils Absolute: 0.1 10*3/uL (ref 0.0–0.1)
Basophils Relative: 1 %
Eosinophils Absolute: 0.1 10*3/uL (ref 0.0–0.5)
Eosinophils Relative: 1 %
HCT: 35 % — ABNORMAL LOW (ref 39.0–52.0)
Hemoglobin: 12.1 g/dL — ABNORMAL LOW (ref 13.0–17.0)
Immature Granulocytes: 0 %
Lymphocytes Relative: 28 %
Lymphs Abs: 1.8 10*3/uL (ref 0.7–4.0)
MCH: 32.4 pg (ref 26.0–34.0)
MCHC: 34.6 g/dL (ref 30.0–36.0)
MCV: 93.8 fL (ref 80.0–100.0)
Monocytes Absolute: 0.7 10*3/uL (ref 0.1–1.0)
Monocytes Relative: 11 %
Neutro Abs: 4 10*3/uL (ref 1.7–7.7)
Neutrophils Relative %: 59 %
Platelet Count: 154 10*3/uL (ref 150–400)
RBC: 3.73 MIL/uL — ABNORMAL LOW (ref 4.22–5.81)
RDW: 13.5 % (ref 11.5–15.5)
WBC Count: 6.6 10*3/uL (ref 4.0–10.5)
nRBC: 0 % (ref 0.0–0.2)

## 2022-11-29 LAB — CMP (CANCER CENTER ONLY)
ALT: 36 U/L (ref 0–44)
AST: 29 U/L (ref 15–41)
Albumin: 3.8 g/dL (ref 3.5–5.0)
Alkaline Phosphatase: 53 U/L (ref 38–126)
Anion gap: 11 (ref 5–15)
BUN: 20 mg/dL (ref 8–23)
CO2: 24 mmol/L (ref 22–32)
Calcium: 9 mg/dL (ref 8.9–10.3)
Chloride: 102 mmol/L (ref 98–111)
Creatinine: 0.91 mg/dL (ref 0.61–1.24)
GFR, Estimated: >60 mL/min (ref >60)
Glucose, Bld: 114 mg/dL — ABNORMAL HIGH (ref 70–99)
Potassium: 4.3 mmol/L (ref 3.5–5.1)
Sodium: 137 mmol/L (ref 135–145)
Total Bilirubin: 0.5 mg/dL (ref 0.3–1.2)
Total Protein: 7.1 g/dL (ref 6.5–8.1)

## 2022-11-29 LAB — LACTATE DEHYDROGENASE: LDH: 108 U/L (ref 98–192)

## 2022-11-29 MED ORDER — ZOLEDRONIC ACID 4 MG/100ML IV SOLN
4.0000 mg | Freq: Once | INTRAVENOUS | Status: AC
  Administered 2022-11-29: 4 mg via INTRAVENOUS
  Filled 2022-11-29: qty 100

## 2022-11-29 MED ORDER — SODIUM CHLORIDE 0.9% FLUSH
10.0000 mL | Freq: Once | INTRAVENOUS | Status: AC | PRN
  Administered 2022-11-29: 10 mL

## 2022-11-29 MED ORDER — HEPARIN SOD (PORK) LOCK FLUSH 100 UNIT/ML IV SOLN
500.0000 [IU] | Freq: Once | INTRAVENOUS | Status: AC | PRN
  Administered 2022-11-29: 500 [IU]

## 2022-11-29 MED ORDER — SODIUM CHLORIDE 0.9 % IV SOLN: Freq: Once | INTRAVENOUS | Status: AC

## 2022-11-29 NOTE — Patient Instructions (Signed)

## 2022-11-29 NOTE — Progress Notes (Signed)
Hematology and Oncology Follow Up Visit  Aaron MAKIN Sr. [id number] 05-16-1955 67 y.o. 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 Belisle is back for for follow-up.  He is doing okay.  His blood sugar is doing much better.  Today, his blood sugar was 114.  I am just happy that he is doing so well.  We did do a PET scan on him.  This was done on 11/23/2022.  The PET scan did not show anything that looked obvious with respect to recurrent lymphoma.  The area that was associated with a left renal sinus really did not show anything that looked suspicious.  He has had no problems with pain.  He has had no problems with cough or shortness of breath.  He has had no headache.  He has had no cardiac issues.  He has had no leg swelling.  He has had no rashes.  There is been no bleeding.  Currently, I would say that his performance status is probably ECOG 1.      Medications:  Current Outpatient Medications:    albuterol (PROAIR HFA) 108 (90 Base) MCG/ACT inhaler, Inhale 1-2 puffs into the lungs every 6 (six) hours as needed for wheezing or shortness of breath., Disp: 8 g, Rfl: 6   aspirin EC 81 MG EC tablet, Take 1 tablet (81 mg total) by mouth daily at 6 (six) AM. Swallow whole. (Patient taking differently: Take 81 mg by mouth in the morning. Swallow whole.), Disp: 30 tablet, Rfl: 11   atenolol (TENORMIN) 25 MG tablet, Take 25 mg by mouth daily., Disp: , Rfl:    blood glucose meter kit and supplies KIT, Dispense based on patient and insurance preference. Use up to four times daily as directed., Disp: 1 each, Rfl: 0   calcium carbonate (OSCAL) 1500 (600 Ca) MG TABS tablet, Take 600 mg of elemental calcium by mouth daily with breakfast., Disp: , Rfl:    Continuous Blood Gluc Receiver (FREESTYLE LIBRE 2  READER) DEVI, , Disp: , Rfl:    Continuous Blood Gluc Sensor (FREESTYLE LIBRE 14 DAY SENSOR) MISC, Inject 1 Device into the skin every 14 (fourteen) days., Disp: , Rfl:    diclofenac Sodium (VOLTAREN) 1 % GEL, Apply 2 g topically daily as needed (to painful sites)., Disp: , Rfl:    DULoxetine (CYMBALTA) 60 MG capsule, Take 60 mg by mouth at bedtime., Disp: , Rfl:    Fish Oil OIL, Take 1,500 mg by mouth daily., Disp: , Rfl:    insulin glargine (LANTUS) 100 UNIT/ML Solostar Pen, Inject 25 Units into the skin daily. (Patient taking differently: Inject 45 Units into the skin at bedtime.), Disp: 15 mL, Rfl: 11   insulin lispro (HUMALOG) 100 UNIT/ML KwikPen, Inject 2-15 Units into the skin 4 (four) times daily - after meals and at bedtime. Glucose 121 - 150: 2 units, Glucose 151 - 200: 3 units, Glucose 201 - 250: 5 units, Glucose 251 - 300: 8 units, Glucose 301 - 350: 11 units, Glucose 351 - 400: 15 units, Glucose > 400 call MD, Disp: 15 mL, Rfl: 11   Insulin Pen Needle 32G X 4 MM MISC, 1 each by Does not apply route 4 (four) times daily - after meals and at bedtime.,  Disp: 100 each, Rfl: 5   lisinopril (ZESTRIL) 5 MG tablet, Take 5 mg by mouth daily., Disp: , Rfl:    metFORMIN (GLUCOPHAGE-XR) 500 MG 24 hr tablet, Take 1,500 mg by mouth daily with breakfast., Disp: , Rfl:    Multiple Vitamin (MULTIVITAMIN ADULT PO), Take 1 tablet by mouth daily., Disp: , Rfl:    omeprazole (PRILOSEC) 40 MG capsule, Take 40 mg by mouth every evening., Disp: , Rfl:    potassium chloride SA (KLOR-CON M20) 20 MEQ tablet, TAKE 1 TABLET (20 MEQ TOTAL) BY MOUTH DAILY.  (Patient taking differently: Take 20 mEq by mouth in the morning.), Disp: 30 tablet, Rfl: 5   rosuvastatin (CRESTOR) 10 MG tablet, Take 10 mg by mouth daily., Disp: , Rfl:    tadalafil (CIALIS) 5 MG tablet, Take 5 mg by mouth in the morning., Disp: , Rfl:    tamsulosin (FLOMAX) 0.4 MG CAPS capsule, Take 0.4 mg by mouth every evening., Disp: , Rfl:    terbinafine  (LAMISIL) 250 MG tablet, Take 2 tablets (500 mg total) by mouth daily., Disp: 60 tablet, Rfl: 2   Zinc 50 MG TABS, Take 50 mg by mouth daily., Disp: , Rfl:    HYDROcodone-acetaminophen (NORCO) 5-325 MG tablet, Take 1 tablet by mouth every 4 (four) hours as needed for severe pain. (Patient not taking: Reported on 11/29/2022), Disp: 15 tablet, Rfl: 0   tiZANidine (ZANAFLEX) 4 MG tablet, Take 4 mg by mouth 2 (two) times daily as needed. (Patient not taking: Reported on 11/29/2022), Disp: , Rfl:    traMADol (ULTRAM) 50 MG tablet, Take 50 mg by mouth 3 (three) times daily as needed. (Patient not taking: Reported on 11/29/2022), Disp: , Rfl:   Allergies:  Allergies  Allergen Reactions   Fexofenadine Other (See Comments)    "dries me out too much"   Pravastatin Sodium Other (See Comments)    Aches   Requip [Ropinirole Hcl] Other (See Comments)    Bad dreams   Hydrochlorothiazide Other (See Comments)    Cramps    Past Medical History, Surgical history, Social history, and Family History were reviewed and updated.  Review of Systems: Review of Systems  Constitutional: Negative.   HENT:  Negative.    Eyes: Negative.   Respiratory: Negative.    Cardiovascular: Negative.   Gastrointestinal: Negative.   Endocrine: Negative.   Genitourinary: Negative.    Musculoskeletal: Negative.   Skin: Negative.   Neurological: Negative.   Hematological: Negative.   Psychiatric/Behavioral: Negative.      Physical Exam:  height is 6' (1.829 m) and weight is 179 lb (81.2 kg). His oral temperature is 98.2 F (36.8 C). His blood pressure is 99/69 and his pulse is 87. His respiration is 20 and oxygen saturation is 99%.   Wt Readings from Last 3 Encounters:  11/29/22 179 lb (81.2 kg)  10/18/22 180 lb (81.6 kg)  10/12/22 181 lb (82.1 kg)    Physical Exam Vitals reviewed.  HENT:     Head: Normocephalic and atraumatic.  Eyes:     Pupils: Pupils are equal, round, and reactive to light.  Cardiovascular:      Rate and Rhythm: Normal rate and regular rhythm.     Heart sounds: Normal heart sounds.  Pulmonary:     Effort: Pulmonary effort is normal.     Breath sounds: Normal breath sounds.  Abdominal:     General: Bowel sounds are normal.     Palpations: Abdomen is soft.  Musculoskeletal:  General: No tenderness or deformity. Normal range of motion.     Cervical back: Normal range of motion.     Comments: Evaluation of the left elbow does show some fluctuance.  There is some erythema.  There is slight tenderness to palpation.  There is no exudate from the eschar.  He has decent range of motion of the elbow.  Lymphadenopathy:     Cervical: No cervical adenopathy.  Skin:    General: Skin is warm and dry.     Findings: No erythema or rash.  Neurological:     Mental Status: He is alert and oriented to person, place, and time.  Psychiatric:        Behavior: Behavior normal.        Thought Content: Thought content normal.        Judgment: Judgment normal.      Lab Results  Component Value Date   WBC 6.6 11/29/2022   HGB 12.1 (L) 11/29/2022   HCT 35.0 (L) 11/29/2022   MCV 93.8 11/29/2022   PLT 154 11/29/2022     Chemistry      Component Value Date/Time   NA 138 10/18/2022 1420   K 4.7 10/18/2022 1420   CL 103 10/18/2022 1420   CO2 24 10/18/2022 1420   BUN 27 (H) 10/18/2022 1420   CREATININE 0.96 10/18/2022 1420      Component Value Date/Time   CALCIUM 10.0 10/18/2022 1420   ALKPHOS 73 10/18/2022 1420   AST 15 10/18/2022 1420   ALT 17 10/18/2022 1420   BILITOT 0.4 10/18/2022 1420      Impression and Plan: Mr. Petrich is a very nice 67 year old white male.  He has diffuse large cell non-Hodgkin's lymphoma.  He has extra nodal disease.  Again, he has a fairly high IPI score.  He has had 8 cycles of chemotherapy with R-CHOP.    I am happy that the PET scan looked okay.  However, I think we still have to watch him closely.  I probably would do another PET scan in 3  months.  I think this would be reasonable.  He gets his Zometa today.  His calcium is fine for the Zometa.  I am just happy that his quality life is improving.  We will plan to get him back to see [place] in 3 months now.  He will get his Port-A-Cath flush in 6 weeks.    Josph Macho, MD 7/24/20243:28 PM

## 2022-12-05 ENCOUNTER — Encounter: Payer: Self-pay | Admitting: Internal Medicine

## 2022-12-05 ENCOUNTER — Ambulatory Visit: Payer: Medicare Other | Admitting: Internal Medicine

## 2022-12-05 ENCOUNTER — Other Ambulatory Visit: Payer: Self-pay

## 2022-12-05 VITALS — BP 118/79 | HR 81 | Resp 16 | Ht 72.0 in | Wt 182.0 lb

## 2022-12-05 DIAGNOSIS — J439 Emphysema, unspecified: Secondary | ICD-10-CM | POA: Diagnosis not present

## 2022-12-05 DIAGNOSIS — I10 Essential (primary) hypertension: Secondary | ICD-10-CM | POA: Diagnosis not present

## 2022-12-05 DIAGNOSIS — M7022 Olecranon bursitis, left elbow: Secondary | ICD-10-CM

## 2022-12-05 DIAGNOSIS — E1169 Type 2 diabetes mellitus with other specified complication: Secondary | ICD-10-CM | POA: Diagnosis not present

## 2022-12-05 DIAGNOSIS — E7800 Pure hypercholesterolemia, unspecified: Secondary | ICD-10-CM | POA: Diagnosis not present

## 2022-12-05 DIAGNOSIS — I480 Paroxysmal atrial fibrillation: Secondary | ICD-10-CM | POA: Diagnosis not present

## 2022-12-05 NOTE — Progress Notes (Signed)
   Subjective:    Patient ID: Aaron Motto Sr., male    DOB: [birth year], 67 y.o.   MRN: [id number]  HPI Aaron Weaver is here for follow up of left olecranon bursitis.   He has had a chronic wound with serosanguinous drainage and has been on multiple different courses of antibiotics including antibacterial, antimycobacterial and antifungal based on swab growth with really no progress.  It is not hot, no inflammed, just won't close.     Review of Systems  Constitutional:  Negative for fatigue.  Gastrointestinal:  Negative for diarrhea.  Skin:  Negative for rash.       Objective:   Physical Exam Eyes:     General: No scleral icterus. Pulmonary:     Effort: Pulmonary effort is normal.  Musculoskeletal:        General: No tenderness.     Comments: Same left elbow scab at small opening.  Dry now  Skin:    Findings: No rash.  Neurological:     Mental Status: He is alert.   SH: No tobacco        Assessment & Plan:

## 2022-12-05 NOTE — Assessment & Plan Note (Signed)
At this point I have tried all I know based on growth and it has really just remained the same.  It just does not seem to be infectious in nature which would be the reason there is no improvement.  At this point, no further antibiotics indicated so will stop.  He will return back to his orthopedic surgeon for reevaluation but I discussed that likely it will just stay that way.   Follow up as needed  I have personally spent 22 minutes involved in face-to-face and non-face-to-face activities for this patient on the day of the visit. Professional time spent includes the following activities: Preparing to see the patient (review of tests), Obtaining and/or reviewing separately obtained history (admission/discharge record), Performing a medically appropriate examination and/or evaluation , Ordering medications/tests/procedures, referring and communicating with other health care professionals, Documenting clinical information in the EMR, Independently interpreting results (not separately reported), Communicating results to the patient/family/caregiver, Counseling and educating the patient/family/caregiver and Care coordination (not separately reported).

## 2022-12-21 DIAGNOSIS — H2513 Age-related nuclear cataract, bilateral: Secondary | ICD-10-CM | POA: Diagnosis not present

## 2022-12-21 DIAGNOSIS — E119 Type 2 diabetes mellitus without complications: Secondary | ICD-10-CM | POA: Diagnosis not present

## 2023-01-04 ENCOUNTER — Other Ambulatory Visit: Payer: Self-pay | Admitting: Oncology

## 2023-01-04 DIAGNOSIS — Z006 Encounter for examination for normal comparison and control in clinical research program: Secondary | ICD-10-CM

## 2023-01-10 ENCOUNTER — Inpatient Hospital Stay: Payer: Medicare Other | Attending: Physician Assistant

## 2023-01-10 VITALS — BP 129/90 | HR 95 | Temp 97.6°F | Resp 20

## 2023-01-10 DIAGNOSIS — C8338 Diffuse large B-cell lymphoma, lymph nodes of multiple sites: Secondary | ICD-10-CM

## 2023-01-10 DIAGNOSIS — Z452 Encounter for adjustment and management of vascular access device: Secondary | ICD-10-CM | POA: Diagnosis not present

## 2023-01-10 DIAGNOSIS — C8330 Diffuse large B-cell lymphoma, unspecified site: Secondary | ICD-10-CM | POA: Diagnosis not present

## 2023-01-10 DIAGNOSIS — Z9221 Personal history of antineoplastic chemotherapy: Secondary | ICD-10-CM | POA: Diagnosis not present

## 2023-01-10 MED ORDER — SODIUM CHLORIDE 0.9% FLUSH
10.0000 mL | INTRAVENOUS | Status: DC | PRN
  Administered 2023-01-10: 10 mL via INTRAVENOUS

## 2023-01-10 MED ORDER — HEPARIN SOD (PORK) LOCK FLUSH 100 UNIT/ML IV SOLN
500.0000 [IU] | Freq: Once | INTRAVENOUS | Status: AC
  Administered 2023-01-10: 500 [IU] via INTRAVENOUS

## 2023-01-10 NOTE — Patient Instructions (Signed)

## 2023-01-11 ENCOUNTER — Other Ambulatory Visit (HOSPITAL_COMMUNITY)
Admission: RE | Admit: 2023-01-11 | Discharge: 2023-01-11 | Disposition: A | Discharge status: Home / Self Care | Payer: Medicare Other | Attending: Oncology | Admitting: Oncology

## 2023-01-11 DIAGNOSIS — Z006 Encounter for examination for normal comparison and control in clinical research program: Secondary | ICD-10-CM | POA: Insufficient documentation

## 2023-01-17 DIAGNOSIS — E1169 Type 2 diabetes mellitus with other specified complication: Secondary | ICD-10-CM | POA: Diagnosis not present

## 2023-01-17 DIAGNOSIS — I10 Essential (primary) hypertension: Secondary | ICD-10-CM | POA: Diagnosis not present

## 2023-01-17 DIAGNOSIS — Z23 Encounter for immunization: Secondary | ICD-10-CM | POA: Diagnosis not present

## 2023-01-17 DIAGNOSIS — E7800 Pure hypercholesterolemia, unspecified: Secondary | ICD-10-CM | POA: Diagnosis not present

## 2023-01-17 DIAGNOSIS — E119 Type 2 diabetes mellitus without complications: Secondary | ICD-10-CM | POA: Diagnosis not present

## 2023-01-23 LAB — GENECONNECT MOLECULAR SCREEN: Genetic Analysis Overall Interpretation: NEGATIVE

## 2023-02-08 ENCOUNTER — Other Ambulatory Visit: Payer: Self-pay | Admitting: *Deleted

## 2023-02-08 ENCOUNTER — Inpatient Hospital Stay: Payer: Medicare Other | Attending: Physician Assistant

## 2023-02-08 ENCOUNTER — Encounter (HOSPITAL_COMMUNITY)
Admission: RE | Admit: 2023-02-08 | Discharge: 2023-02-08 | Disposition: A | Discharge status: Home / Self Care | Payer: Medicare Other | Source: Ambulatory Visit | Attending: Hematology & Oncology | Admitting: Hematology & Oncology

## 2023-02-08 DIAGNOSIS — C8330 Diffuse large B-cell lymphoma, unspecified site: Secondary | ICD-10-CM | POA: Diagnosis not present

## 2023-02-08 DIAGNOSIS — R93422 Abnormal radiologic findings on diagnostic imaging of left kidney: Secondary | ICD-10-CM | POA: Diagnosis not present

## 2023-02-08 DIAGNOSIS — N390 Urinary tract infection, site not specified: Secondary | ICD-10-CM | POA: Diagnosis present

## 2023-02-08 DIAGNOSIS — C8338 Diffuse large B-cell lymphoma, lymph nodes of multiple sites: Secondary | ICD-10-CM | POA: Insufficient documentation

## 2023-02-08 LAB — URINALYSIS, COMPLETE (UACMP) WITH MICROSCOPIC
Bilirubin Urine: NEGATIVE
Glucose, UA: >=500 mg/dL — AB
Hgb urine dipstick: NEGATIVE
Ketones, ur: NEGATIVE mg/dL
Leukocytes,Ua: NEGATIVE
Nitrite: NEGATIVE
Protein, ur: NEGATIVE mg/dL
RBC / HPF: NONE SEEN RBC/hpf (ref 0–5)
Specific Gravity, Urine: 1.02 (ref 1.005–1.030)
WBC, UA: NONE SEEN WBC/hpf (ref 0–5)
pH: 5.5 (ref 5.0–8.0)

## 2023-02-08 LAB — GLUCOSE, CAPILLARY: Glucose-Capillary: 227 mg/dL — ABNORMAL HIGH (ref 70–99)

## 2023-02-08 MED ORDER — FLUDEOXYGLUCOSE F - 18 (FDG) INJECTION
9.0000 | Freq: Once | INTRAVENOUS | Status: AC | PRN
  Administered 2023-02-08: 9 via INTRAVENOUS

## 2023-02-09 LAB — URINE CULTURE: Culture: NO GROWTH

## 2023-02-23 ENCOUNTER — Inpatient Hospital Stay: Payer: Medicare Other

## 2023-02-23 ENCOUNTER — Encounter: Payer: Self-pay | Admitting: Hematology & Oncology

## 2023-02-23 ENCOUNTER — Inpatient Hospital Stay (HOSPITAL_BASED_OUTPATIENT_CLINIC_OR_DEPARTMENT_OTHER): Payer: Medicare Other | Admitting: Hematology & Oncology

## 2023-02-23 VITALS — BP 119/82 | HR 78 | Temp 98.6°F | Resp 20 | Ht 72.0 in | Wt 181.0 lb

## 2023-02-23 DIAGNOSIS — C8330 Diffuse large B-cell lymphoma, unspecified site: Secondary | ICD-10-CM | POA: Diagnosis not present

## 2023-02-23 DIAGNOSIS — C8338 Diffuse large B-cell lymphoma, lymph nodes of multiple sites: Secondary | ICD-10-CM | POA: Diagnosis not present

## 2023-02-23 DIAGNOSIS — M25552 Pain in left hip: Secondary | ICD-10-CM

## 2023-02-23 DIAGNOSIS — Z9221 Personal history of antineoplastic chemotherapy: Secondary | ICD-10-CM | POA: Diagnosis not present

## 2023-02-23 DIAGNOSIS — S2249XS Multiple fractures of ribs, unspecified side, sequela: Secondary | ICD-10-CM

## 2023-02-23 DIAGNOSIS — Z452 Encounter for adjustment and management of vascular access device: Secondary | ICD-10-CM | POA: Insufficient documentation

## 2023-02-23 LAB — CBC WITH DIFFERENTIAL (CANCER CENTER ONLY)
Abs Immature Granulocytes: 0.02 10*3/uL (ref 0.00–0.07)
Basophils Absolute: 0 10*3/uL (ref 0.0–0.1)
Basophils Relative: 1 %
Eosinophils Absolute: 0.1 10*3/uL (ref 0.0–0.5)
Eosinophils Relative: 1 %
HCT: 32.9 % — ABNORMAL LOW (ref 39.0–52.0)
Hemoglobin: 11.4 g/dL — ABNORMAL LOW (ref 13.0–17.0)
Immature Granulocytes: 0 %
Lymphocytes Relative: 30 %
Lymphs Abs: 1.7 10*3/uL (ref 0.7–4.0)
MCH: 32.1 pg (ref 26.0–34.0)
MCHC: 34.7 g/dL (ref 30.0–36.0)
MCV: 92.7 fL (ref 80.0–100.0)
Monocytes Absolute: 0.5 10*3/uL (ref 0.1–1.0)
Monocytes Relative: 9 %
Neutro Abs: 3.3 10*3/uL (ref 1.7–7.7)
Neutrophils Relative %: 59 %
Platelet Count: 130 10*3/uL — ABNORMAL LOW (ref 150–400)
RBC: 3.55 MIL/uL — ABNORMAL LOW (ref 4.22–5.81)
RDW: 13.5 % (ref 11.5–15.5)
WBC Count: 5.7 10*3/uL (ref 4.0–10.5)
nRBC: 0 % (ref 0.0–0.2)

## 2023-02-23 LAB — CMP (CANCER CENTER ONLY)
ALT: 22 U/L (ref 0–44)
AST: 22 U/L (ref 15–41)
Albumin: 3.9 g/dL (ref 3.5–5.0)
Alkaline Phosphatase: 45 U/L (ref 38–126)
Anion gap: 9 (ref 5–15)
BUN: 23 mg/dL (ref 8–23)
CO2: 24 mmol/L (ref 22–32)
Calcium: 8.7 mg/dL — ABNORMAL LOW (ref 8.9–10.3)
Chloride: 101 mmol/L (ref 98–111)
Creatinine: 1 mg/dL (ref 0.61–1.24)
GFR, Estimated: >60 mL/min (ref >60)
Glucose, Bld: 253 mg/dL — ABNORMAL HIGH (ref 70–99)
Potassium: 4.5 mmol/L (ref 3.5–5.1)
Sodium: 134 mmol/L — ABNORMAL LOW (ref 135–145)
Total Bilirubin: 0.5 mg/dL (ref 0.3–1.2)
Total Protein: 6.8 g/dL (ref 6.5–8.1)

## 2023-02-23 LAB — LACTATE DEHYDROGENASE: LDH: 109 U/L (ref 98–192)

## 2023-02-23 MED ORDER — HEPARIN SOD (PORK) LOCK FLUSH 100 UNIT/ML IV SOLN
500.0000 [IU] | Freq: Once | INTRAVENOUS | Status: AC | PRN
  Administered 2023-02-23: 500 [IU]

## 2023-02-23 MED ORDER — SODIUM CHLORIDE 0.9 % IV SOLN: Freq: Once | INTRAVENOUS | Status: AC

## 2023-02-23 MED ORDER — SODIUM CHLORIDE 0.9% FLUSH
10.0000 mL | Freq: Once | INTRAVENOUS | Status: AC | PRN
  Administered 2023-02-23: 10 mL

## 2023-02-23 MED ORDER — ZOLEDRONIC ACID 4 MG/100ML IV SOLN
4.0000 mg | Freq: Once | INTRAVENOUS | Status: AC
  Administered 2023-02-23: 4 mg via INTRAVENOUS
  Filled 2023-02-23: qty 100

## 2023-02-23 NOTE — Progress Notes (Signed)
Hematology and Oncology Follow Up Visit  Aaron Weaver. [id number] 1955/07/12 67 y.o. 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  So far, everything seems be doing pretty well with him.  He had a PET scan that was done on 02/08/2023.  Thankfully, this did not show any evidence of recurrent lymphoma.  He has had no problems with nausea or vomiting.  He has had no change in bowel or bladder habits.  His main problem continues to be his high blood sugars which are poorly controlled.  He has had no fever.  He has had no cough or shortness of breath.  His appetite has been quite good.  He has had no leg swelling.  He has had no joint aches or pains.  He has had no headache.  Thankfully, his heart seems to be doing pretty well.  Overall, I would say his performance status is probably ECOG 1.        Medications:  Current Outpatient Medications:    aspirin EC 81 MG EC tablet, Take 1 tablet (81 mg total) by mouth daily at 6 (six) AM. Swallow whole. (Patient taking differently: Take 81 mg by mouth in the morning. Swallow whole.), Disp: 30 tablet, Rfl: 11   atenolol (TENORMIN) 25 MG tablet, Take 25 mg by mouth daily., Disp: , Rfl:    blood glucose meter kit and supplies KIT, Dispense based on patient and insurance preference. Use up to four times daily as directed., Disp: 1 each, Rfl: 0   calcium carbonate (OSCAL) 1500 (600 Ca) MG TABS tablet, Take 600 mg of elemental calcium by mouth daily with breakfast., Disp: , Rfl:    DULoxetine (CYMBALTA) 60 MG capsule, Take 60 mg by mouth at bedtime., Disp: , Rfl:    Fish Oil OIL, Take 1,500 mg by mouth daily., Disp: , Rfl:    HYDROcodone-acetaminophen (NORCO) 5-325 MG tablet, Take 1 tablet by mouth every 4 (four)  hours as needed for severe pain., Disp: 15 tablet, Rfl: 0   insulin glargine (LANTUS) 100 UNIT/ML Solostar Pen, Inject 25 Units into the skin daily. (Patient taking differently: Inject 25 Units into the skin at bedtime.), Disp: 15 mL, Rfl: 11   insulin lispro (HUMALOG) 100 UNIT/ML KwikPen, Inject 2-15 Units into the skin 4 (four) times daily - after meals and at bedtime. Glucose 121 - 150: 2 units, Glucose 151 - 200: 3 units, Glucose 201 - 250: 5 units, Glucose 251 - 300: 8 units, Glucose 301 - 350: 11 units, Glucose 351 - 400: 15 units, Glucose > 400 call MD, Disp: 15 mL, Rfl: 11   Insulin Pen Needle 32G X 4 MM MISC, 1 each by Does not apply route 4 (four) times daily - after meals and at bedtime., Disp: 100 each, Rfl: 5   lisinopril (ZESTRIL) 5 MG tablet, Take 5 mg by mouth daily., Disp: , Rfl:    metFORMIN (GLUCOPHAGE-XR) 500 MG 24 hr tablet, Take 1,500 mg by mouth daily with breakfast., Disp: , Rfl:    Multiple Vitamin (MULTIVITAMIN ADULT PO), Take 1 tablet by mouth daily., Disp: , Rfl:    omeprazole (PRILOSEC) 40 MG capsule, Take 40 mg by mouth every evening., Disp: , Rfl:  potassium chloride SA (KLOR-CON M20) 20 MEQ tablet, TAKE 1 TABLET (20 MEQ TOTAL) BY MOUTH DAILY.  (Patient taking differently: Take 20 mEq by mouth in the morning.), Disp: 30 tablet, Rfl: 5   rosuvastatin (CRESTOR) 10 MG tablet, Take 10 mg by mouth daily., Disp: , Rfl:    tadalafil (CIALIS) 5 MG tablet, Take 5 mg by mouth in the morning., Disp: , Rfl:    tamsulosin (FLOMAX) 0.4 MG CAPS capsule, Take 0.4 mg by mouth every evening., Disp: , Rfl:    Zinc 50 MG TABS, Take 50 mg by mouth daily., Disp: , Rfl:    albuterol (PROAIR HFA) 108 (90 Base) MCG/ACT inhaler, Inhale 1-2 puffs into the lungs every 6 (six) hours as needed for wheezing or shortness of breath. (Patient not taking: Reported on 02/23/2023), Disp: 8 g, Rfl: 6   diclofenac Sodium (VOLTAREN) 1 % GEL, Apply 2 g topically daily as needed (to painful sites). (Patient not  taking: Reported on 02/23/2023), Disp: , Rfl:    tiZANidine (ZANAFLEX) 4 MG tablet, Take 4 mg by mouth 2 (two) times daily as needed. (Patient not taking: Reported on 11/29/2022), Disp: , Rfl:    traMADol (ULTRAM) 50 MG tablet, Take 50 mg by mouth 3 (three) times daily as needed. (Patient not taking: Reported on 02/23/2023), Disp: , Rfl:   Allergies:  Allergies  Allergen Reactions   Fexofenadine Other (See Comments)    "dries me out too much"   Pravastatin Sodium Other (See Comments)    Aches   Requip [Ropinirole Hcl] Other (See Comments)    Bad dreams   Hydrochlorothiazide Other (See Comments)    Cramps    Past Medical History, Surgical history, Social history, and Family History were reviewed and updated.  Review of Systems: Review of Systems  Constitutional: Negative.   HENT:  Negative.    Eyes: Negative.   Respiratory: Negative.    Cardiovascular: Negative.   Gastrointestinal: Negative.   Endocrine: Negative.   Genitourinary: Negative.    Musculoskeletal: Negative.   Skin: Negative.   Neurological: Negative.   Hematological: Negative.   Psychiatric/Behavioral: Negative.      Physical Exam:  height is 6' (1.829 m) and weight is 181 lb (82.1 kg). His oral temperature is 98.6 F (37 C). His blood pressure is 119/82 and his pulse is 78. His respiration is 20 and oxygen saturation is 97%.   Wt Readings from Last 3 Encounters:  02/23/23 181 lb (82.1 kg)  12/05/22 182 lb (82.6 kg)  11/29/22 179 lb (81.2 kg)    Physical Exam Vitals reviewed.  HENT:     Head: Normocephalic and atraumatic.  Eyes:     Pupils: Pupils are equal, round, and reactive to light.  Cardiovascular:     Rate and Rhythm: Normal rate and regular rhythm.     Heart sounds: Normal heart sounds.  Pulmonary:     Effort: Pulmonary effort is normal.     Breath sounds: Normal breath sounds.  Abdominal:     General: Bowel sounds are normal.     Palpations: Abdomen is soft.  Musculoskeletal:         General: No tenderness or deformity. Normal range of motion.     Cervical back: Normal range of motion.     Comments: Evaluation of the left elbow does show some fluctuance.  There is some erythema.  There is slight tenderness to palpation.  There is no exudate from the eschar.  He has decent range of motion  of the elbow.  Lymphadenopathy:     Cervical: No cervical adenopathy.  Skin:    General: Skin is warm and dry.     Findings: No erythema or rash.  Neurological:     Mental Status: He is alert and oriented to person, place, and time.  Psychiatric:        Behavior: Behavior normal.        Thought Content: Thought content normal.        Judgment: Judgment normal.     Lab Results  Component Value Date   WBC 5.7 02/23/2023   HGB 11.4 (L) 02/23/2023   HCT 32.9 (L) 02/23/2023   MCV 92.7 02/23/2023   PLT 130 (L) 02/23/2023     Chemistry      Component Value Date/Time   NA 134 (L) 02/23/2023 1310   K 4.5 02/23/2023 1310   CL 101 02/23/2023 1310   CO2 24 02/23/2023 1310   BUN 23 02/23/2023 1310   CREATININE 1.00 02/23/2023 1310      Component Value Date/Time   CALCIUM 8.7 (L) 02/23/2023 1310   ALKPHOS 45 02/23/2023 1310   AST 22 02/23/2023 1310   ALT 22 02/23/2023 1310   BILITOT 0.5 02/23/2023 1310      Impression and Plan: Mr. Andelin is a very nice 68 year old white male.  He has diffuse large cell non-Hodgkin's lymphoma.  He has extra nodal disease.  Again, he has a fairly high IPI score.  He has had 8 cycles of chemotherapy with R-CHOP.    Again, the PET scan looks quite good.  You do not see evidence of recurrent disease.  He will get his Zometa today.  I think we can probably hold off on another PET scan for probably for a good 6 months now.  We will plan to get him through the Holiday season.  I have him come back in January.  He will have his Zometa then.   Josph Macho, MD 10/18/20242:15 PM

## 2023-02-23 NOTE — Patient Instructions (Signed)

## 2023-04-03 DIAGNOSIS — R338 Other retention of urine: Secondary | ICD-10-CM | POA: Diagnosis not present

## 2023-06-07 ENCOUNTER — Other Ambulatory Visit: Payer: Self-pay | Admitting: *Deleted

## 2023-06-07 DIAGNOSIS — Z95828 Presence of other vascular implants and grafts: Secondary | ICD-10-CM

## 2023-06-19 ENCOUNTER — Ambulatory Visit: Payer: Medicare Other

## 2023-06-19 ENCOUNTER — Ambulatory Visit (HOSPITAL_COMMUNITY): Payer: Medicare Other

## 2023-06-20 IMAGING — RF DG C-ARM 1-60 MIN
1 series · 1 of 1 positions shown · non-contrast
Comparison: None.

CLINICAL DATA: Fluoroscopic assistance for placement of chest port

EXAM:
DG C-ARM 1-60 MIN
FLUOROSCOPY TIME:  Fluoroscopy Time:  12 seconds
Number of Acquired Spot Images: 1

[Series 1: run · 1 of 1 slices shown]
[im 1/1]
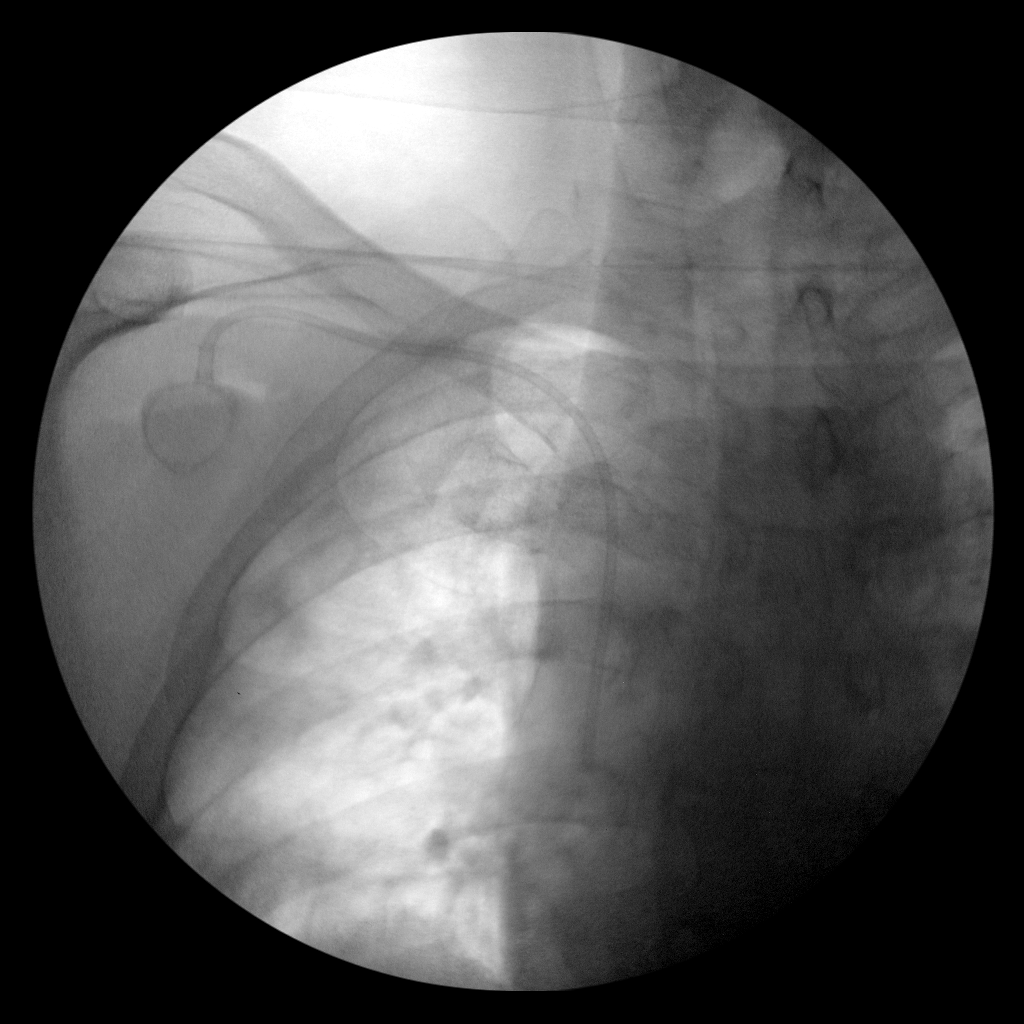

[1 of 1 positions shown; findings below may reference images not displayed]

FINDINGS: Fluoroscopic assistance was provided for placement of right chest
port
IMPRESSION: Fluoroscopic assistance was provided for placement of right chest
port.

## 2023-06-20 IMAGING — DX DG CHEST 1V PORT
1 series · 1 of 1 positions shown · non-contrast
Comparison: 07/12/2020

CLINICAL DATA: Placement of right chest port

EXAM:
PORTABLE CHEST 1 VIEW

[chest ap]
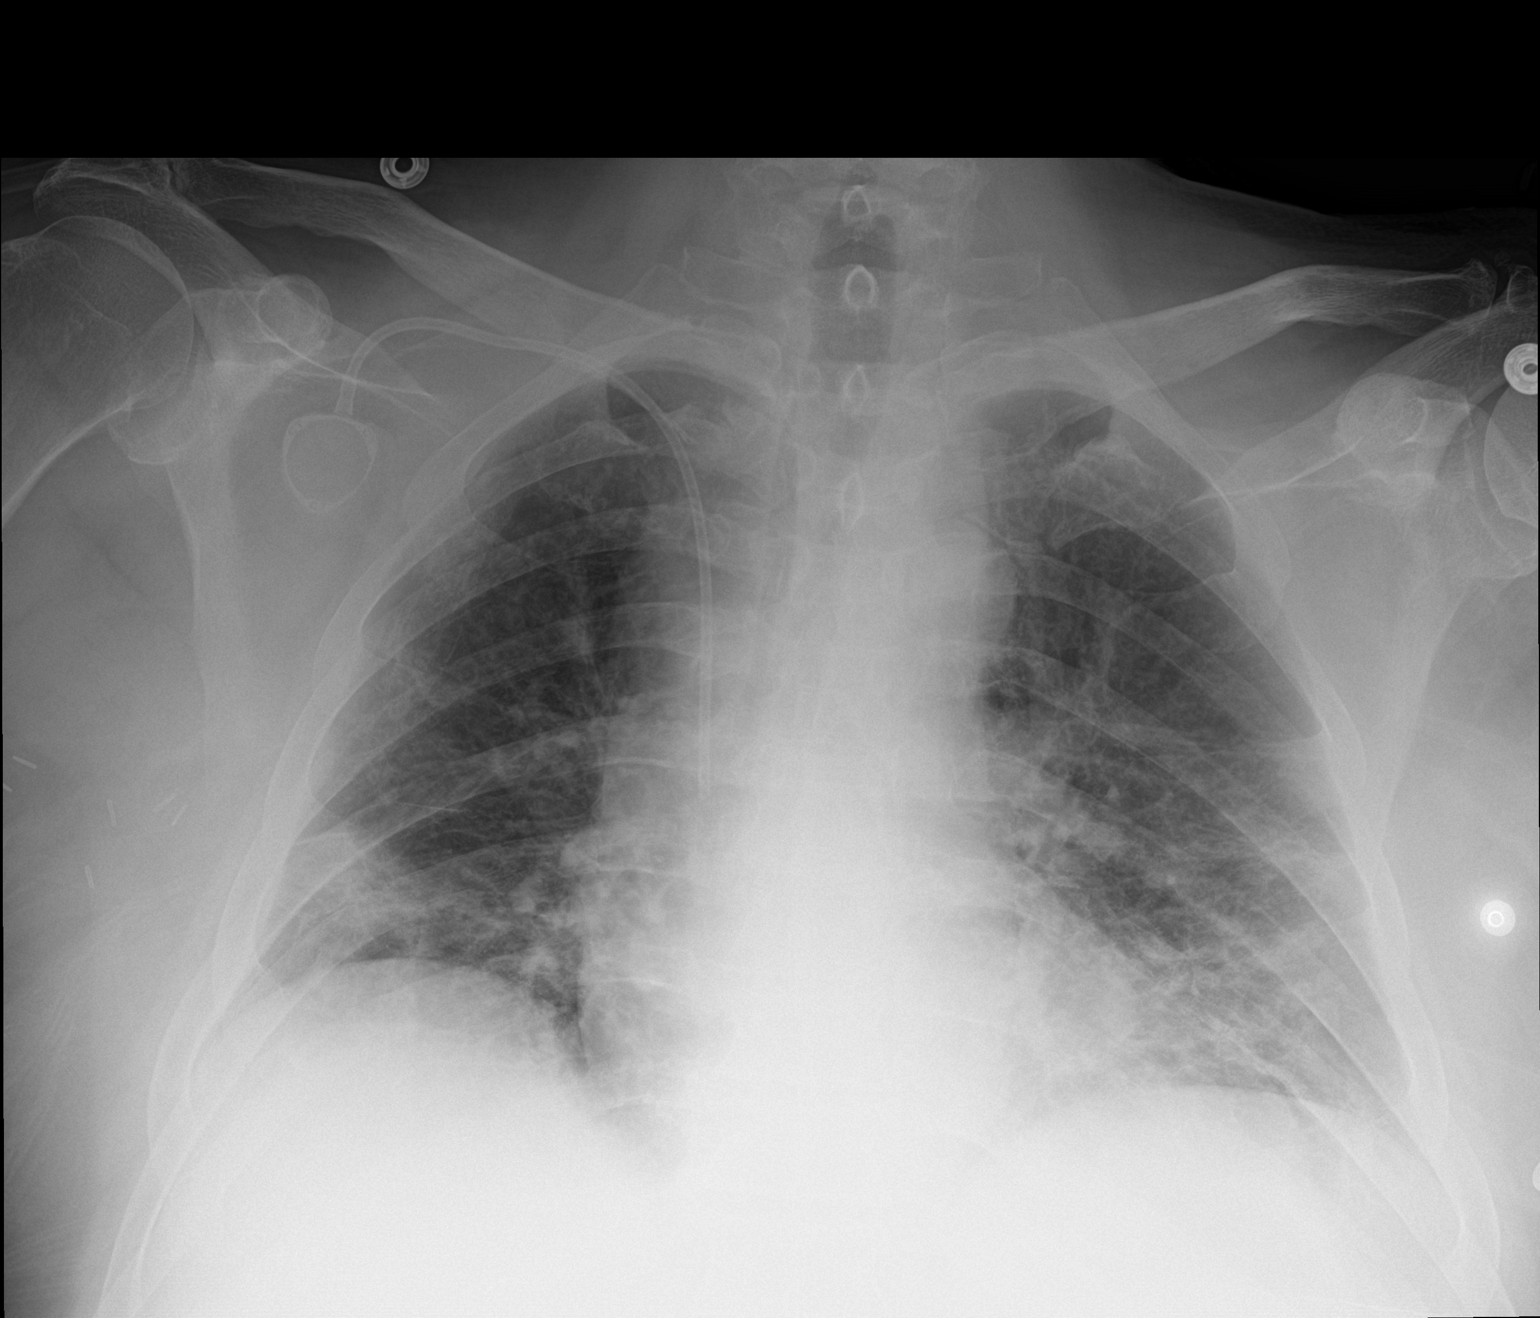

[1 of 1 positions shown; findings below may reference images not displayed]

FINDINGS: Cardiac size is within normal limits. There is interval appearance
of patchy infiltrates in the parahilar regions and lower lung fields
suggesting possible multifocal pneumonia. Possibility of underlying
scarring is not excluded. There is no significant pleural effusion
or pneumothorax. There is interval placement of right subclavian
chest port with its tip in the superior vena cava. There is no
significant pleural effusion or pneumothorax. Surgical clips are
seen in the right chest wall and right axilla.
IMPRESSION: Interval appearance of extensive patchy infiltrates in both lungs,
more so in the lower lung fields suggesting possible multifocal
pneumonia.

## 2023-07-02 DIAGNOSIS — M869 Osteomyelitis, unspecified: Secondary | ICD-10-CM | POA: Diagnosis not present

## 2023-07-02 DIAGNOSIS — M778 Other enthesopathies, not elsewhere classified: Secondary | ICD-10-CM | POA: Diagnosis not present

## 2023-07-02 DIAGNOSIS — L02414 Cutaneous abscess of left upper limb: Secondary | ICD-10-CM | POA: Diagnosis not present

## 2023-07-02 DIAGNOSIS — L080 Pyoderma: Secondary | ICD-10-CM | POA: Diagnosis not present

## 2023-07-02 DIAGNOSIS — M86129 Other acute osteomyelitis, unspecified humerus: Secondary | ICD-10-CM | POA: Diagnosis not present

## 2023-07-02 DIAGNOSIS — D485 Neoplasm of uncertain behavior of skin: Secondary | ICD-10-CM | POA: Diagnosis not present

## 2023-07-06 IMAGING — CT CT HEAD W/O CM
3 series · 16 of 47 positions shown, 19 images · non-contrast
Comparison: 03/28/2012

CLINICAL DATA: Near syncope. Fall. Mental status change, unknown
cause



[Series 1: head wo · axial · 0.47mm/px · z∈[-111,+34]mm · 10 of 35 slices shown, 13 images]
[im 3/35  brain]
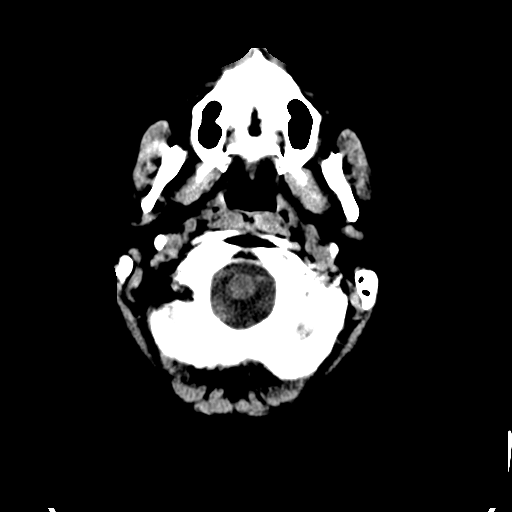
[im 3/35  bone]
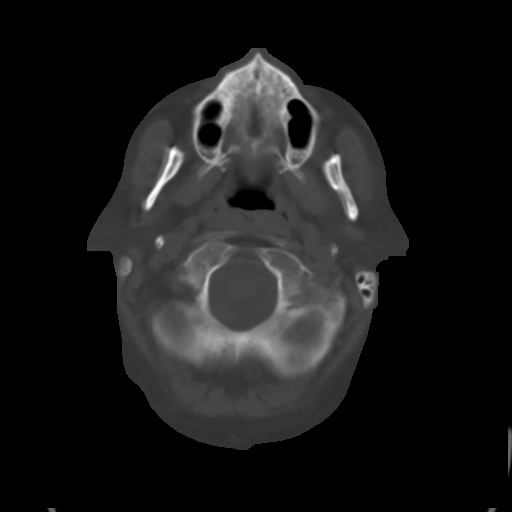
[im 6/35  brain]
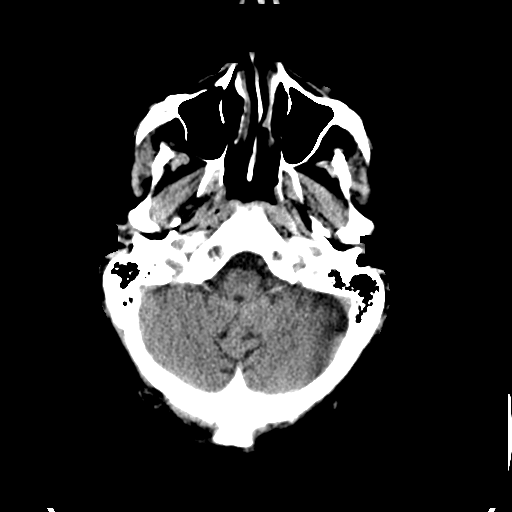
[im 10/35  brain]
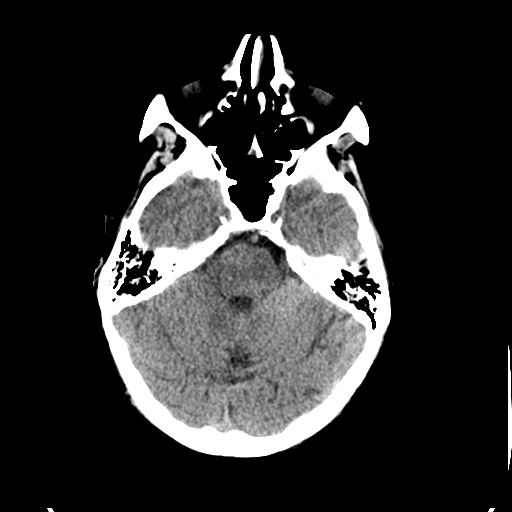
[im 12/35  brain]
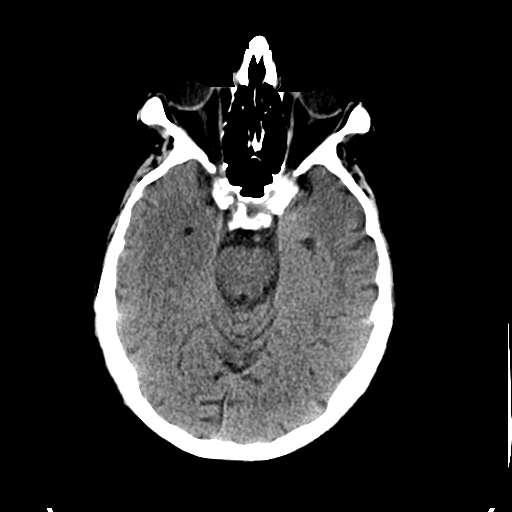
[im 16/35  brain]
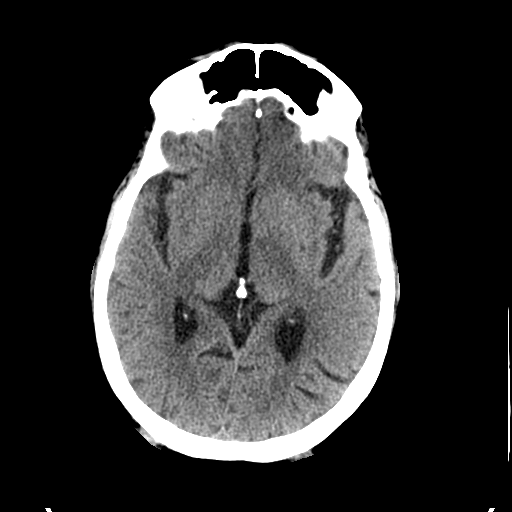
[im 16/35  bone]
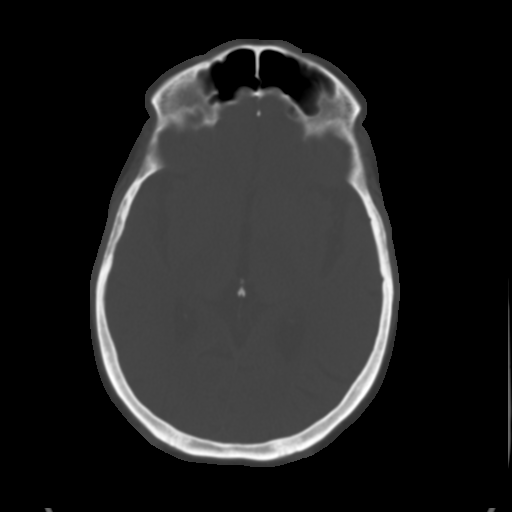
[im 19/35  brain]
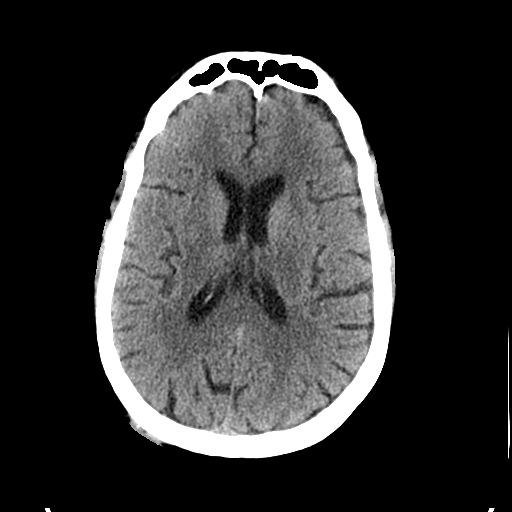
[im 23/35  brain]
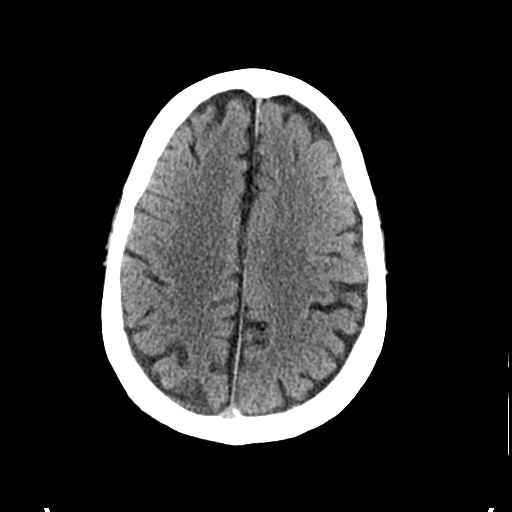
[im 26/35  brain]
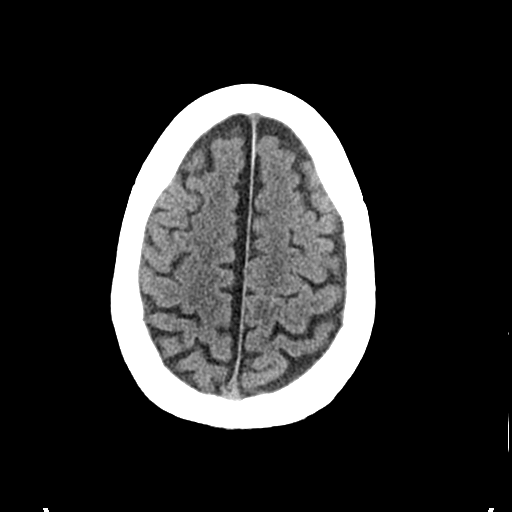
[im 29/35  brain]
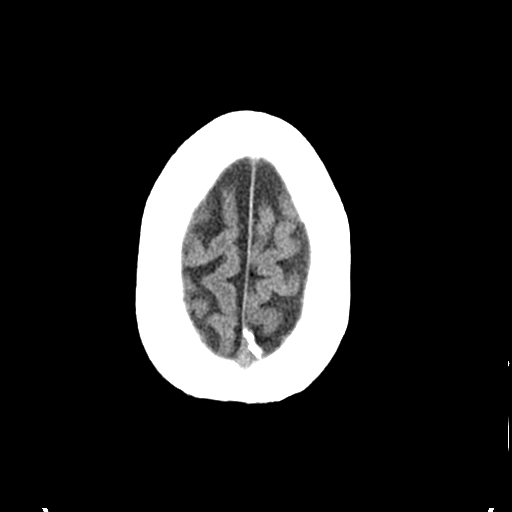
[im 29/35  bone]
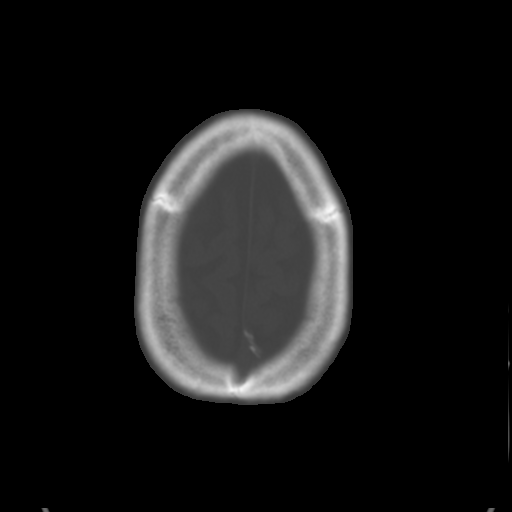
[im 32/35  brain]
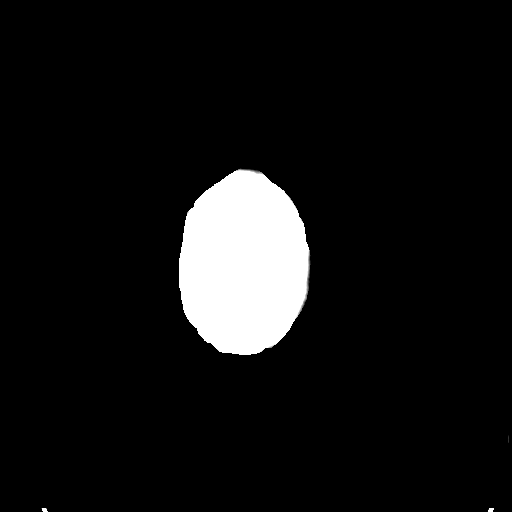

[Series 5: coronal soft tissue · coronal · 0.35mm/px · 3 of 74 slices shown]
[im 25/74  brain]
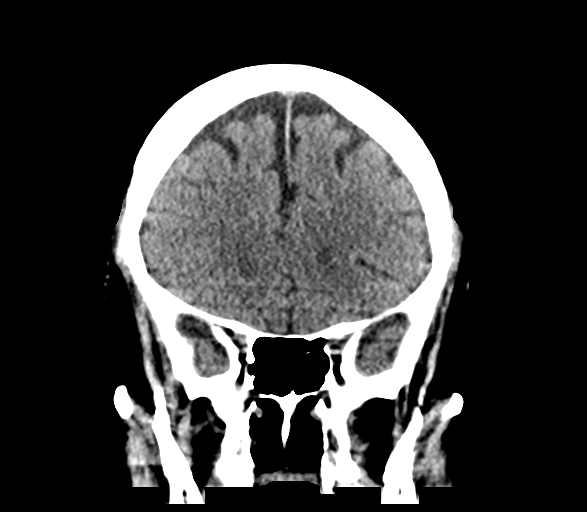
[im 33/74  brain]
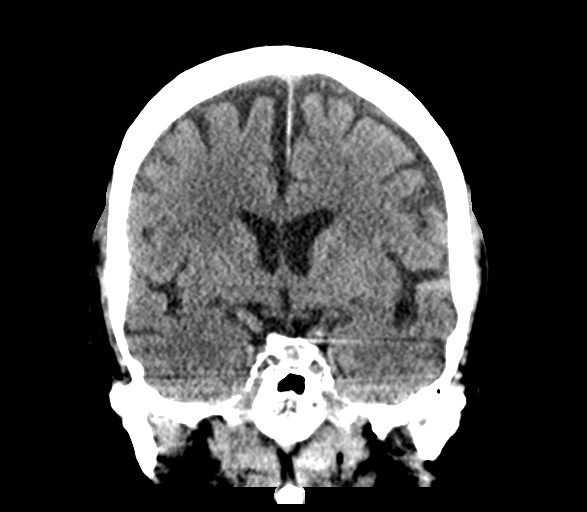
[im 41/74  brain]
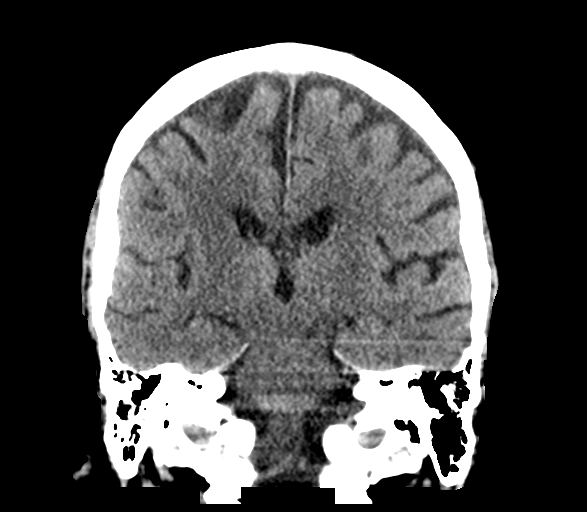

[Series 6: sagittal soft tissue · sagittal · 0.35mm/px · 3 of 69 slices shown]
[im 23/69  brain]
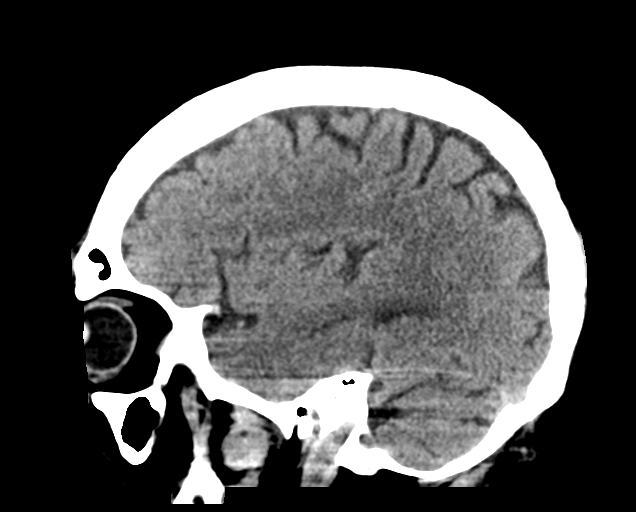
[im 35/69  brain]
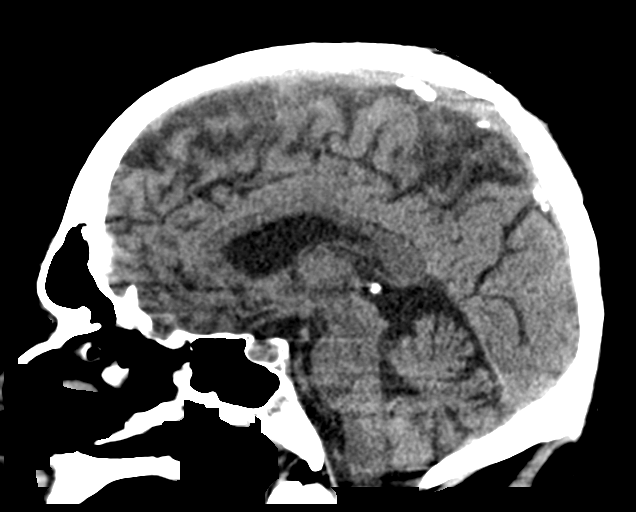
[im 46/69  brain]
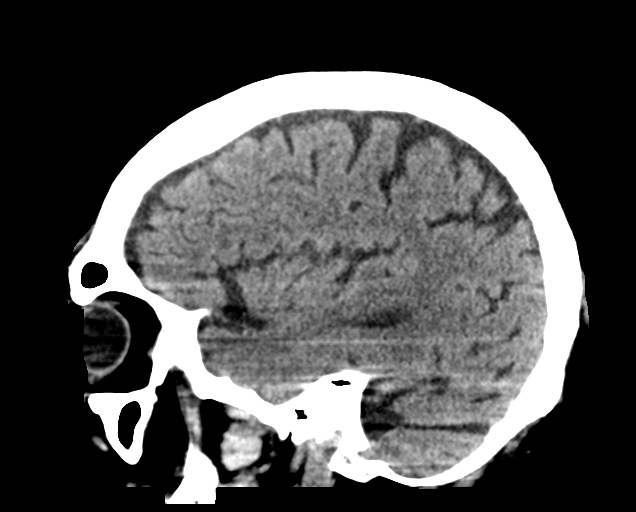

[16 of 47 positions shown; findings below may reference images not displayed]

FINDINGS: Brain: No acute intracranial abnormality. Specifically, no
hemorrhage, hydrocephalus, mass lesion, acute infarction, or
significant intracranial injury.

Vascular: No hyperdense vessel or unexpected calcification.

Skull: No acute calvarial abnormality.

Sinuses/Orbits: No acute findings

Other: None
IMPRESSION: No acute intracranial abnormality.

## 2023-07-06 IMAGING — CT CT ANGIO CHEST
3 of 7 series · 18 of 36 positions shown · IV contrast (agent unspecified)
Comparison: Standard CT chest 10/14/2019

CLINICAL DATA: Near syncope.  Fall.  PE suspected.

EXAM:
CT ANGIOGRAPHY CHEST WITH CONTRAST
TECHNIQUE: Multidetector CT imaging of the chest was performed using the
standard protocol during bolus administration of intravenous
contrast. Multiplanar CT image reconstructions and MIPs were
obtained to evaluate the vascular anatomy.

[Series 6: thins · axial · 0.75mm/px · z∈[-481,-253]mm · 13 of 268 slices shown]
[im 20/268  lung]
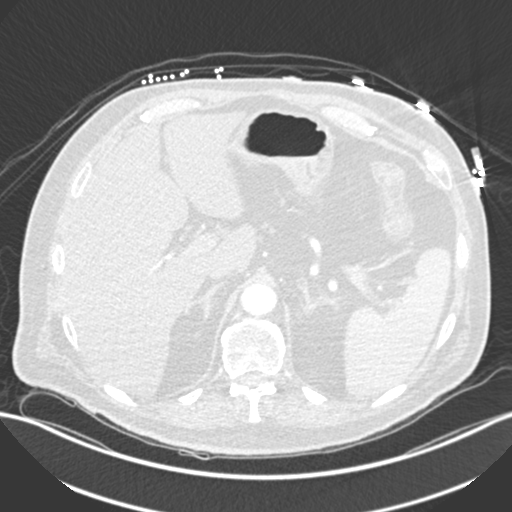
[im 39/268  mediastinal]
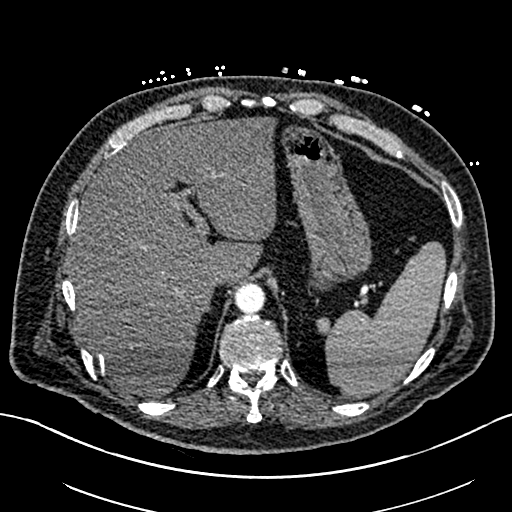
[im 58/268  lung]
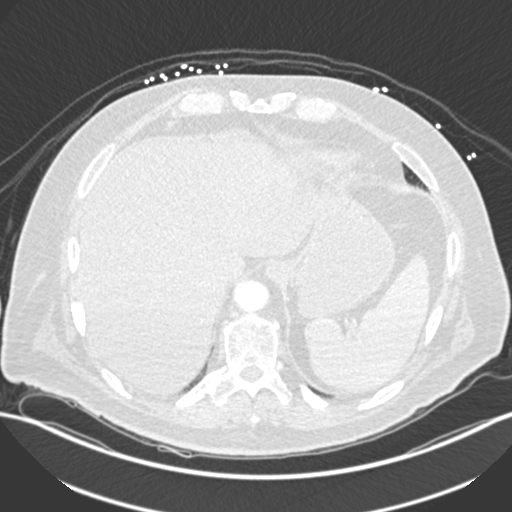
[im 77/268  mediastinal]
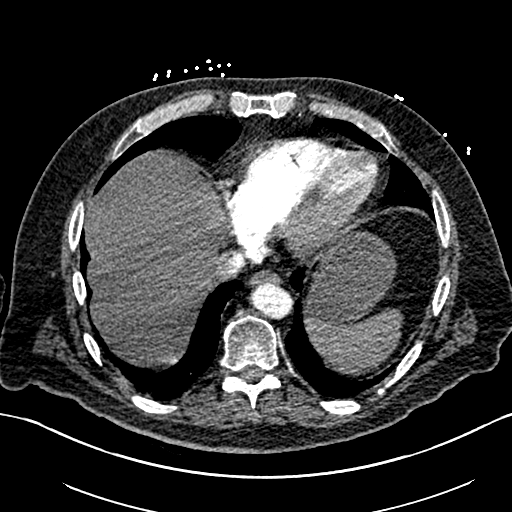
[im 96/268  lung]
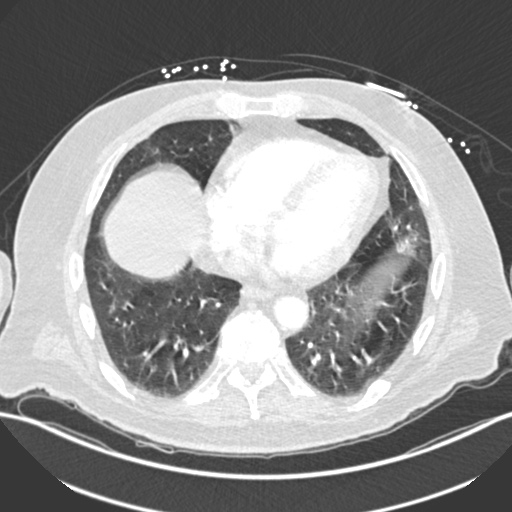
[im 115/268  mediastinal]
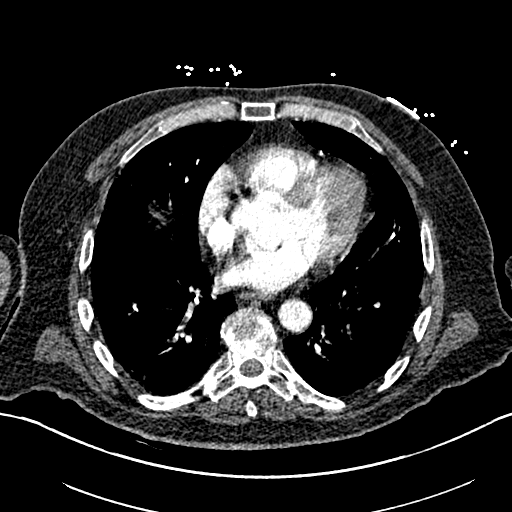
[im 134/268  lung]
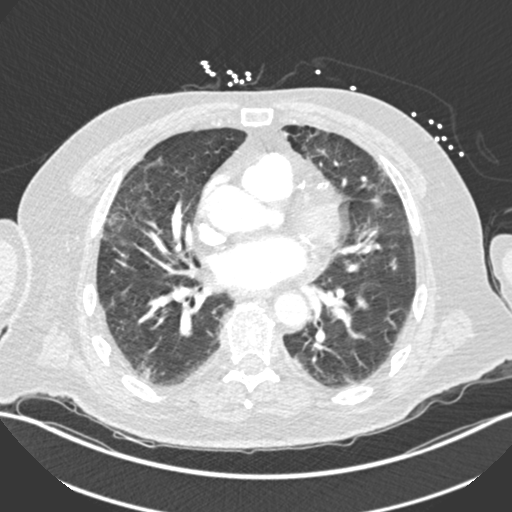
[im 153/268  mediastinal]
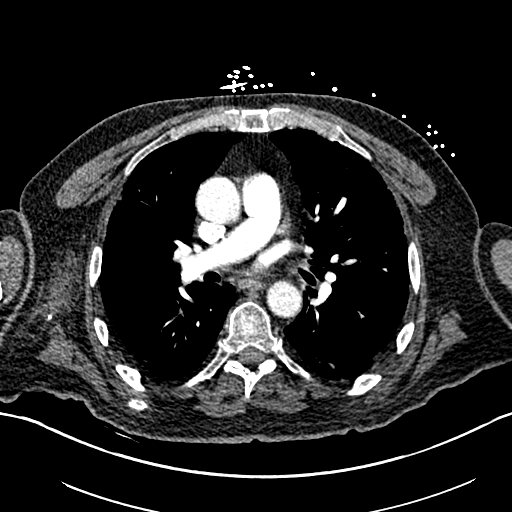
[im 172/268  lung]
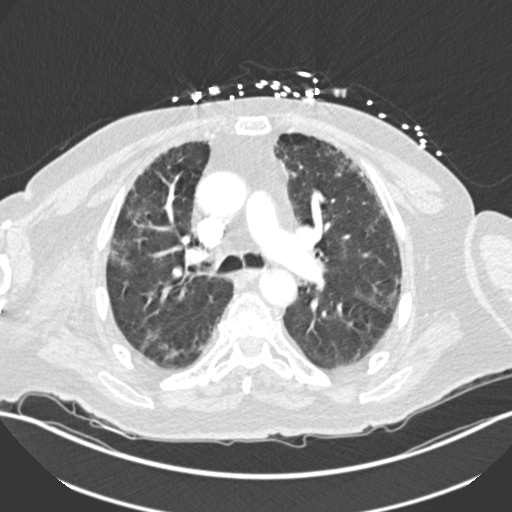
[im 191/268  mediastinal]
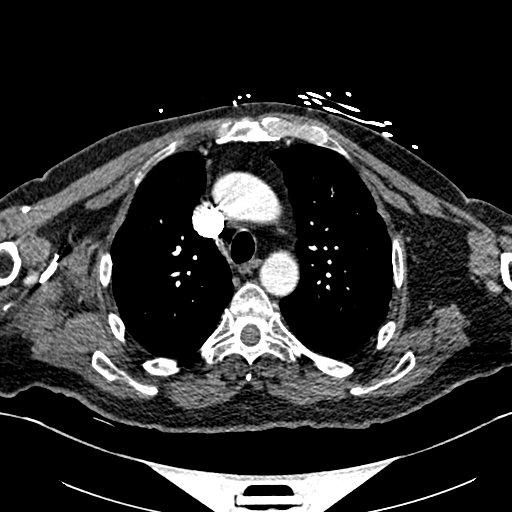
[im 210/268  lung]
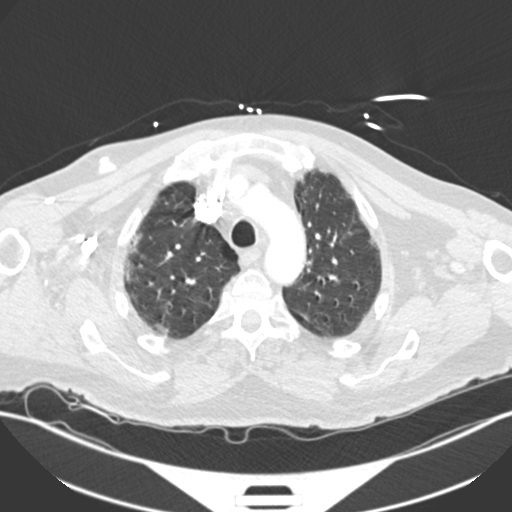
[im 229/268  mediastinal]
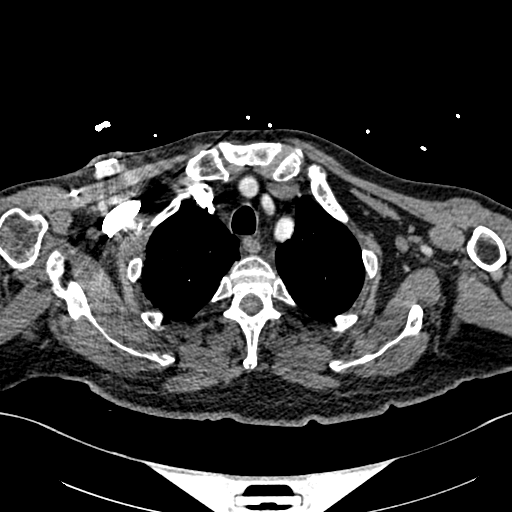
[im 248/268  lung]
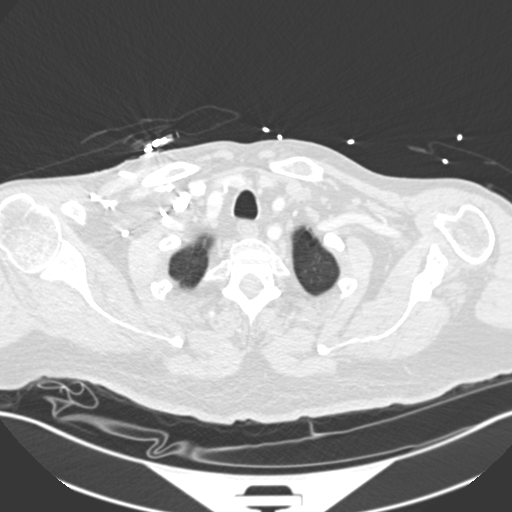

[Series 7: coronal mpr · coronal · 0.52mm/px · 1 of 139 slices shown]
[im 70/139  mediastinal]
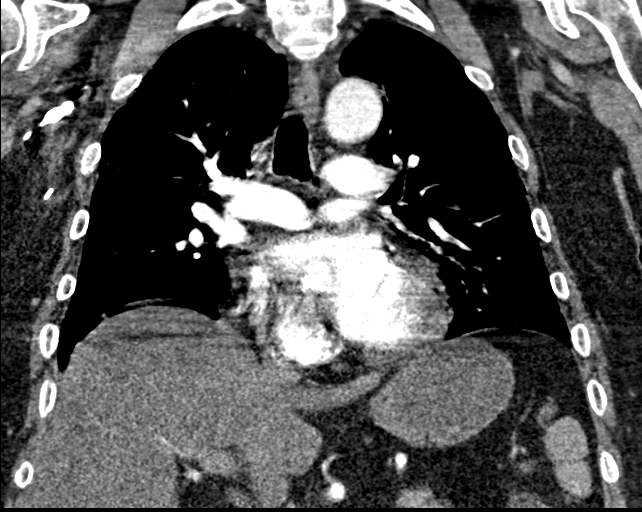

[Series 11: lung · axial · 0.60mm/px · z∈[-411,-279]mm · 4 of 112 slices shown]
[im 23/112  mediastinal]
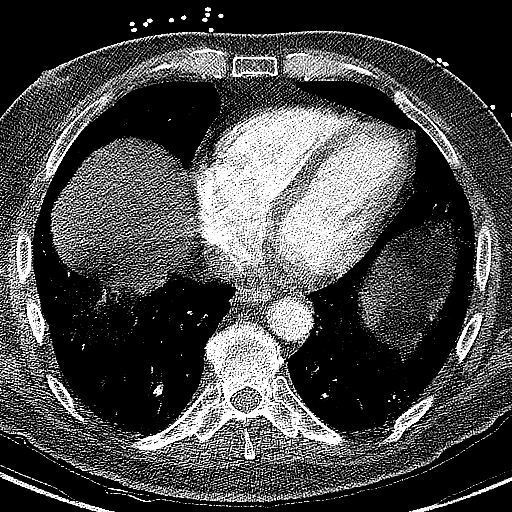
[im 45/112  mediastinal]
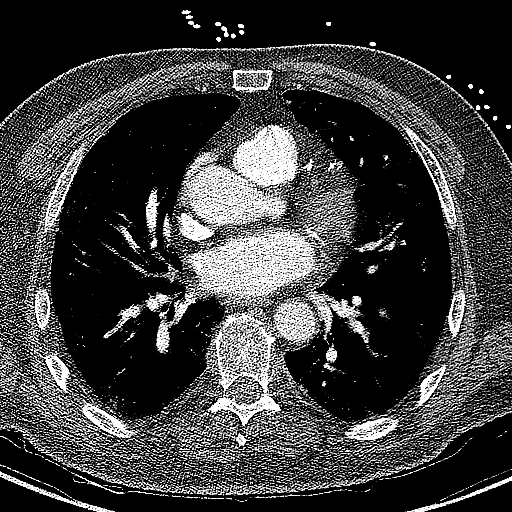
[im 67/112  mediastinal]
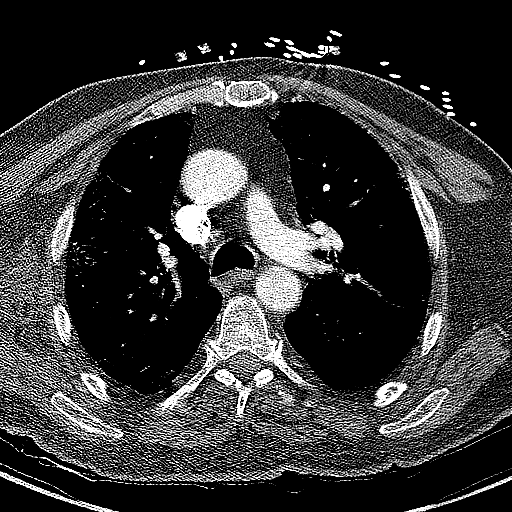
[im 89/112  mediastinal]
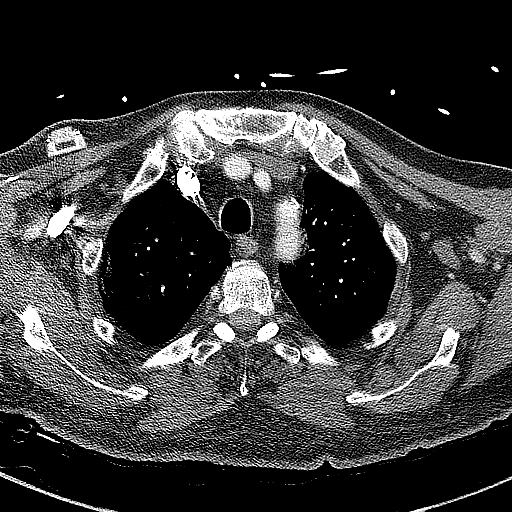

[18 of 36 positions shown; findings below may reference images not displayed]

RADIATION DOSE REDUCTION: This exam was performed according to the
departmental dose-optimization program which includes automated
exposure control, adjustment of the mA and/or kV according to
patient size and/or use of iterative reconstruction technique.

CONTRAST:  75mL OMNIPAQUE IOHEXOL 350 MG/ML SOLN
FINDINGS: Cardiovascular: The heart size is normal. No substantial pericardial
effusion. Coronary artery calcification is evident. Mild
atherosclerotic calcification is noted in the wall of the thoracic
aorta. There is no filling defect within the opacified pulmonary
arteries to suggest the presence of an acute pulmonary embolus.
Right Port-A-Cath tip is positioned in the distal SVC.

Mediastinum/Nodes: No mediastinal lymphadenopathy. There is no hilar
lymphadenopathy. The esophagus has normal imaging features. There is
no axillary lymphadenopathy.

Lungs/Pleura: Interval development of peripheral architectural
distortion and scarring with areas of peripheral ground-glass
opacity since prior chest CT and also since PET-CT of 04/15/2021. No
focal airspace consolidation. No pleural effusion. No pneumothorax.
Paraseptal emphysema noted in the lung apices.

Upper Abdomen: The liver shows diffusely decreased attenuation
suggesting fat deposition. Tiny fat density lesions in both adrenal
glands are consistent with tiny adenomas or myelolipomas, stable.

Musculoskeletal: No worrisome lytic or sclerotic osseous
abnormality. Old fracture nonunion posterior right tenth rib with
healed posterior ninth rib fracture. Evidence of previous vertebral
augmentation at L1, incompletely visualized.

Review of the MIP images confirms the above findings.
IMPRESSION: 1. No CT evidence for acute pulmonary embolus.
2. Since prior PET-CT of 04/15/2021, the patient has developed
peripheral patchy and nodular areas of architectural distortion and
ground-glass opacity. Imaging features are suggestive of sequelae of
prior atypical infection, including viral etiology. Acute
infectious/inflammatory process is not excluded but considered less
likely.
3. The liver shows diffusely decreased attenuation suggesting fat
deposition.
4.  Emphysema (BXK37-DTS.6) and Aortic Atherosclerosis (BXK37-170.0)

## 2023-07-16 DIAGNOSIS — L02414 Cutaneous abscess of left upper limb: Secondary | ICD-10-CM | POA: Diagnosis not present

## 2023-08-01 ENCOUNTER — Ambulatory Visit: Payer: Medicare Other | Attending: Cardiology | Admitting: Cardiology

## 2023-08-01 ENCOUNTER — Encounter: Payer: Self-pay | Admitting: Cardiology

## 2023-08-01 VITALS — BP 115/80 | HR 86 | Ht 71.0 in | Wt 184.0 lb

## 2023-08-01 DIAGNOSIS — I493 Ventricular premature depolarization: Secondary | ICD-10-CM

## 2023-08-01 DIAGNOSIS — E782 Mixed hyperlipidemia: Secondary | ICD-10-CM | POA: Diagnosis not present

## 2023-08-01 DIAGNOSIS — I480 Paroxysmal atrial fibrillation: Secondary | ICD-10-CM

## 2023-08-01 DIAGNOSIS — Z95828 Presence of other vascular implants and grafts: Secondary | ICD-10-CM

## 2023-08-01 DIAGNOSIS — I77810 Thoracic aortic ectasia: Secondary | ICD-10-CM | POA: Diagnosis not present

## 2023-08-01 NOTE — Progress Notes (Signed)
 Cardiology Office Note:  .   Date:  08/01/2023  ID:  Aaron Motto Sr., DOB [birth year], MRN [id number] PCP: Aliene Beams, MD  [facility] Providers Cardiologist:  Donato Schultz, MD    History of Present Illness: .   Aaron FORTIN Sr. is a 67 y.o. male Discussed the use of AI scribe software for clinical note transcription with the patient, who gave verbal consent to proceed.  History of Present Illness Aaron SHEROD Sr. "[name]" is a 68 year old male with a history of abdominal aortic aneurysm, tobacco use, diabetes, hypertension, and COPD who presents for follow-up. His wife, [name], has helped with historical items during visits.  He underwent endovascular repair of an abdominal aortic aneurysm using a bifurcated stent graft and iliac endograft on July 12, 2020. Previous scans show no endoleak.  He has a history of frequent premature ventricular contractions (PVCs), which were noted previously on telemetry and are still present on his EKG today. He has been taking atenolol 25 mg daily for several years. He did not tolerate losartan 50 mg in the past. His echocardiogram in 2020 showed an ejection fraction of 60% with minimal tricuspid regurgitation, and in 2024, it showed an ejection fraction of 50-55% with trivial mitral regurgitation.  He has chronic hypertension managed with lisinopril 5 mg daily and atenolol 25 mg daily. Blood pressure appears stable with the current regimen.  He has a history of diabetes with an A1c of 7.2, which is slightly elevated. He continues to manage his diabetes with lifestyle modifications and medications, including lisinopril 5 mg daily and rosuvastatin 10 mg daily.  He has a history of COPD and quit smoking in 2020. He is accustomed to his COPD symptoms. No new chest pain, shortness of breath, or wheezing.  He has a history of lymphoma, which is currently in remission.  His LDL cholesterol was 52 in 9562, which is within the desired range.  His TSH was 0.7, and creatinine was 1.0, both within normal limits.      ROS: no cp  Studies Reviewed: Marland Kitchen   EKG Interpretation Date/Time:  Wednesday August 01 2023 08:34:57 EDT Ventricular Rate:  86 PR Interval:  138 QRS Duration:  124 QT Interval:  384 QTC Calculation: 459 R Axis:   61  Text Interpretation: Sinus rhythm with frequent Premature ventricular complexes Right bundle branch block When compared with ECG of 05-Nov-2021 21:01, Premature ventricular complexes are new Confirmed by Donato Schultz ([id number]) on 08/01/2023 9:06:18 AM    Results LABS LDL: 52 (2024) TSH: 0.7 (2024) Creatinine: 1.0 (2024) A1c: 7.2 (2024)  DIAGNOSTIC EKG: PVCs (08/01/2023) Echocardiogram: EF 50-55%, trivial MR (2024) Risk Assessment/Calculations:            Physical Exam:   VS:  BP 115/80   Pulse 86   Ht 5\' 11"  (1.803 m)   Wt 184 lb (83.5 kg)   SpO2 94%   BMI 25.66 kg/m    Wt Readings from Last 3 Encounters:  08/01/23 184 lb (83.5 kg)  02/23/23 181 lb (82.1 kg)  12/05/22 182 lb (82.6 kg)    GEN: Well nourished, well developed in no acute distress NECK: No JVD; No carotid bruits CARDIAC: RRR, no murmurs, no rubs, no gallops RESPIRATORY:  Clear to auscultation without rales, wheezing or rhonchi  ABDOMEN: Soft, non-tender, non-distended EXTREMITIES:  No edema; No deformity   ASSESSMENT AND PLAN: .    Assessment and Plan Assessment & Plan Premature ventricular contractions (PVCs) Frequent  PVCs on telemetry and current EKG. No family history of CAD. Atenolol likely managing PVCs effectively. No changes in symptoms or management necessary. - Continue atenolol 25 mg daily.  Abdominal aortic aneurysm (AAA) status post endovascular repair Endovascular repair with bifurcated stent graft on July 12, 2020. Previous and current scans show no endoleak. Echocardiogram indicates good function. Continued monitoring essential. - Continue regular follow-ups.  Chronic obstructive pulmonary  disease (COPD) Accustomed to COPD symptoms. No wheezing today. Quit smoking in 2020, beneficial for management. Encouraged to maintain physical activity. - Continue current management and encourage regular physical activity.  Type II diabetes mellitus A1c is 7.2, slightly elevated. Actively managing diabetes. Current management effective. - Continue current diabetes management regimen.  Mixed hyperlipidemia LDL cholesterol at 52 in 2024, within target range. Statin therapy effective. - Continue rosuvastatin 10 mg daily.  Unspecified essential hypertension On lisinopril 5 mg daily. Blood pressure well-managed. - Continue lisinopril 5 mg daily.  Diffuse large B-cell lymphoma Lymphoma in remission. Undergoing checkups every six months to monitor for recurrence. - Continue regular checkups every six months.  Follow-up Conditions well-managed with current regimen. Plan to reassess in one year. - Schedule follow-up in one year with an advanced practice provider (APP). - He to reach out if there are any changes or new symptoms.           Signed, Donato Schultz, MD

## 2023-08-01 NOTE — Patient Instructions (Signed)
 Medication Instructions:   Your physician recommends that you continue on your current medications as directed. Please refer to the Current Medication list given to you today.  *If you need a refill on your cardiac medications before your next appointment, please call your pharmacy*    Follow-Up: At [facility], you and your health needs are our priority.  As part of our continuing mission to provide you with exceptional heart care, we have created designated Provider Care Teams.  These Care Teams include your primary Cardiologist (physician) and Advanced Practice Providers (APPs -  Physician Assistants and Nurse Practitioners) who all work together to provide you with the care you need, when you need it.  We recommend signing up for the patient portal called "MyChart".  Sign up information is provided on this After Visit Summary.  MyChart is used to connect with patients for Virtual Visits (Telemedicine).  Patients are able to view lab/test results, encounter notes, upcoming appointments, etc.  Non-urgent messages can be sent to your provider as well.   To learn more about what you can do with MyChart, go to [email or web address].    Your next appointment:   1 year(s)  Provider:   Jari Favre, PA-C, Ronie Spies, PA-C, Robin Searing, NP, Jacolyn Reedy, PA-C, Eligha Bridegroom, NP, Tereso Newcomer, PA-C, or Perlie Gold, PA-C

## 2023-08-09 ENCOUNTER — Encounter: Payer: Self-pay | Admitting: Hematology & Oncology

## 2023-08-10 ENCOUNTER — Encounter: Payer: Self-pay | Admitting: Hematology & Oncology

## 2023-08-10 ENCOUNTER — Inpatient Hospital Stay

## 2023-08-10 ENCOUNTER — Inpatient Hospital Stay: Attending: Hematology & Oncology

## 2023-08-10 ENCOUNTER — Inpatient Hospital Stay: Admitting: Hematology & Oncology

## 2023-08-10 VITALS — BP 114/80 | HR 87 | Temp 98.1°F | Resp 17 | Ht 71.0 in | Wt 183.0 lb

## 2023-08-10 DIAGNOSIS — C8330 Diffuse large B-cell lymphoma, unspecified site: Secondary | ICD-10-CM | POA: Diagnosis not present

## 2023-08-10 DIAGNOSIS — Z9221 Personal history of antineoplastic chemotherapy: Secondary | ICD-10-CM | POA: Diagnosis not present

## 2023-08-10 DIAGNOSIS — C8338 Diffuse large B-cell lymphoma, lymph nodes of multiple sites: Secondary | ICD-10-CM | POA: Diagnosis not present

## 2023-08-10 DIAGNOSIS — M25552 Pain in left hip: Secondary | ICD-10-CM

## 2023-08-10 DIAGNOSIS — Z7982 Long term (current) use of aspirin: Secondary | ICD-10-CM | POA: Diagnosis not present

## 2023-08-10 DIAGNOSIS — S2249XS Multiple fractures of ribs, unspecified side, sequela: Secondary | ICD-10-CM

## 2023-08-10 DIAGNOSIS — Z452 Encounter for adjustment and management of vascular access device: Secondary | ICD-10-CM | POA: Insufficient documentation

## 2023-08-10 LAB — CBC WITH DIFFERENTIAL (CANCER CENTER ONLY)
Abs Immature Granulocytes: 0.01 10*3/uL (ref 0.00–0.07)
Basophils Absolute: 0.1 10*3/uL (ref 0.0–0.1)
Basophils Relative: 1 %
Eosinophils Absolute: 0.1 10*3/uL (ref 0.0–0.5)
Eosinophils Relative: 2 %
HCT: 34.8 % — ABNORMAL LOW (ref 39.0–52.0)
Hemoglobin: 12.3 g/dL — ABNORMAL LOW (ref 13.0–17.0)
Immature Granulocytes: 0 %
Lymphocytes Relative: 40 %
Lymphs Abs: 2.6 10*3/uL (ref 0.7–4.0)
MCH: 32.4 pg (ref 26.0–34.0)
MCHC: 35.3 g/dL (ref 30.0–36.0)
MCV: 91.6 fL (ref 80.0–100.0)
Monocytes Absolute: 0.6 10*3/uL (ref 0.1–1.0)
Monocytes Relative: 9 %
Neutro Abs: 3.1 10*3/uL (ref 1.7–7.7)
Neutrophils Relative %: 48 %
Platelet Count: 147 10*3/uL — ABNORMAL LOW (ref 150–400)
RBC: 3.8 MIL/uL — ABNORMAL LOW (ref 4.22–5.81)
RDW: 13.7 % (ref 11.5–15.5)
WBC Count: 6.5 10*3/uL (ref 4.0–10.5)
nRBC: 0 % (ref 0.0–0.2)

## 2023-08-10 LAB — CMP (CANCER CENTER ONLY)
ALT: 25 U/L (ref 0–44)
AST: 18 U/L (ref 15–41)
Albumin: 4.5 g/dL (ref 3.5–5.0)
Alkaline Phosphatase: 44 U/L (ref 38–126)
Anion gap: 8 (ref 5–15)
BUN: 23 mg/dL (ref 8–23)
CO2: 25 mmol/L (ref 22–32)
Calcium: 9 mg/dL (ref 8.9–10.3)
Chloride: 102 mmol/L (ref 98–111)
Creatinine: 0.96 mg/dL (ref 0.61–1.24)
GFR, Estimated: >60 mL/min (ref >60)
Glucose, Bld: 189 mg/dL — ABNORMAL HIGH (ref 70–99)
Potassium: 4.8 mmol/L (ref 3.5–5.1)
Sodium: 135 mmol/L (ref 135–145)
Total Bilirubin: 0.7 mg/dL (ref 0.0–1.2)
Total Protein: 6.9 g/dL (ref 6.5–8.1)

## 2023-08-10 LAB — LACTATE DEHYDROGENASE: LDH: 114 U/L (ref 98–192)

## 2023-08-10 MED ORDER — HEPARIN SOD (PORK) LOCK FLUSH 100 UNIT/ML IV SOLN
500.0000 [IU] | Freq: Once | INTRAVENOUS | Status: AC | PRN
  Administered 2023-08-10: 500 [IU]

## 2023-08-10 MED ORDER — SODIUM CHLORIDE 0.9 % IV SOLN: Freq: Once | INTRAVENOUS | Status: AC

## 2023-08-10 MED ORDER — SODIUM CHLORIDE 0.9% FLUSH
10.0000 mL | Freq: Once | INTRAVENOUS | Status: AC | PRN
  Administered 2023-08-10: 10 mL

## 2023-08-10 MED ORDER — ZOLEDRONIC ACID 4 MG/100ML IV SOLN
4.0000 mg | Freq: Once | INTRAVENOUS | Status: AC
  Administered 2023-08-10: 4 mg via INTRAVENOUS
  Filled 2023-08-10: qty 100

## 2023-08-10 NOTE — Patient Instructions (Signed)

## 2023-08-10 NOTE — Progress Notes (Signed)
 Hematology and Oncology Follow Up Visit  Aaron SILVA Sr. [id number] June 27, 1955 68 y.o. 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  So far, everything seems be doing pretty well with him.  We last saw him back in October.  So far, things are going pretty well for him.  He still try to manage his blood sugars.    He is having a little bit of discomfort in the hip area.  I know that his lymphoma has been involved with his bones.  We probably need to do another PET scan on him to make sure that nothing is going on.  He had a PET scan that was done on 02/08/2023.  Thankfully, this did not show any evidence of recurrent lymphoma.  He has had no problems with nausea or vomiting.  He has had no change in bowel or bladder habits.  His main problem continues to be his high blood sugars which are poorly controlled.  He has had no fever.  He has had no cough or shortness of breath.  His appetite has been quite good.  He has had no leg swelling.  He has had no joint aches or pains.  He has had no headache.  Thankfully, his heart seems to be doing pretty well.  Overall, I would say his performance status is probably ECOG 1.        Medications:  Current Outpatient Medications:    aspirin EC 81 MG EC tablet, Take 1 tablet (81 mg total) by mouth daily at 6 (six) AM. Swallow whole. (Patient taking differently: Take 81 mg by mouth in the morning. Swallow whole.), Disp: 30 tablet, Rfl: 11   atenolol (TENORMIN) 25 MG tablet, Take 25 mg by mouth daily., Disp: , Rfl:    blood glucose meter kit and supplies KIT, Dispense based on patient and insurance preference. Use up to four times daily as directed., Disp: 1 each, Rfl: 0   calcium carbonate (OSCAL) 1500 (600 Ca) MG TABS tablet,  Take 600 mg of elemental calcium by mouth daily with breakfast., Disp: , Rfl:    Continuous Glucose Sensor (DEXCOM G7 SENSOR) MISC, , Disp: , Rfl:    diclofenac Sodium (VOLTAREN) 1 % GEL, Apply 2 g topically daily as needed (to painful sites)., Disp: , Rfl:    DULoxetine (CYMBALTA) 60 MG capsule, Take 60 mg by mouth at bedtime., Disp: , Rfl:    Fish Oil OIL, Take 1,500 mg by mouth daily., Disp: , Rfl:    insulin glargine (LANTUS) 100 UNIT/ML Solostar Pen, Inject 25 Units into the skin daily. (Patient taking differently: Inject 20 Units into the skin at bedtime.), Disp: 15 mL, Rfl: 11   insulin lispro (HUMALOG) 100 UNIT/ML KwikPen, Inject 2-15 Units into the skin 4 (four) times daily - after meals and at bedtime. Glucose 121 - 150: 2 units, Glucose 151 - 200: 3 units, Glucose 201 - 250: 5 units, Glucose 251 - 300: 8 units, Glucose 301 - 350: 11 units, Glucose 351 - 400: 15 units, Glucose > 400 call MD, Disp: 15 mL, Rfl: 11   Insulin Pen Needle 32G X 4 MM MISC, 1 each by Does not apply route 4 (four) times daily -  after meals and at bedtime., Disp: 100 each, Rfl: 5   lisinopril (ZESTRIL) 5 MG tablet, Take 5 mg by mouth daily., Disp: , Rfl:    metFORMIN (GLUCOPHAGE-XR) 500 MG 24 hr tablet, Take 2,000 mg by mouth daily with breakfast., Disp: , Rfl:    Multiple Vitamin (MULTIVITAMIN ADULT PO), Take 1 tablet by mouth daily., Disp: , Rfl:    mupirocin ointment (BACTROBAN) 2 %, Apply topically at bedtime., Disp: , Rfl:    omeprazole (PRILOSEC) 40 MG capsule, Take 40 mg by mouth every evening., Disp: , Rfl:    potassium chloride SA (KLOR-CON M20) 20 MEQ tablet, TAKE 1 TABLET (20 MEQ TOTAL) BY MOUTH DAILY.  (Patient taking differently: Take 20 mEq by mouth in the morning.), Disp: 30 tablet, Rfl: 5   rosuvastatin (CRESTOR) 10 MG tablet, Take 10 mg by mouth daily., Disp: , Rfl:    tadalafil (CIALIS) 5 MG tablet, Take 5 mg by mouth in the morning., Disp: , Rfl:    tamsulosin (FLOMAX) 0.4 MG CAPS capsule, Take 0.4  mg by mouth every evening., Disp: , Rfl:    Zinc 50 MG TABS, Take 50 mg by mouth daily., Disp: , Rfl:    albuterol (PROAIR HFA) 108 (90 Base) MCG/ACT inhaler, Inhale 1-2 puffs into the lungs every 6 (six) hours as needed for wheezing or shortness of breath. (Patient not taking: Reported on 08/10/2023), Disp: 8 g, Rfl: 6   HYDROcodone-acetaminophen (NORCO) 5-325 MG tablet, Take 1 tablet by mouth every 4 (four) hours as needed for severe pain. (Patient not taking: Reported on 08/10/2023), Disp: 15 tablet, Rfl: 0   tiZANidine (ZANAFLEX) 4 MG tablet, Take 4 mg by mouth 2 (two) times daily as needed. (Patient not taking: Reported on 08/10/2023), Disp: , Rfl:    traMADol (ULTRAM) 50 MG tablet, Take 50 mg by mouth 3 (three) times daily as needed. (Patient not taking: Reported on 08/10/2023), Disp: , Rfl:   Allergies:  Allergies  Allergen Reactions   Fexofenadine Other (See Comments)    "dries me out too much"   Pravastatin Sodium Other (See Comments)    Aches   Requip [Ropinirole Hcl] Other (See Comments)    Bad dreams   Hydrochlorothiazide Other (See Comments)    Cramps    Past Medical History, Surgical history, Social history, and Family History were reviewed and updated.  Review of Systems: Review of Systems  Constitutional: Negative.   HENT:  Negative.    Eyes: Negative.   Respiratory: Negative.    Cardiovascular: Negative.   Gastrointestinal: Negative.   Endocrine: Negative.   Genitourinary: Negative.    Musculoskeletal: Negative.   Skin: Negative.   Neurological: Negative.   Hematological: Negative.   Psychiatric/Behavioral: Negative.      Physical Exam:  height is 5\' 11"  (1.803 m) and weight is 183 lb (83 kg). His oral temperature is 98.1 F (36.7 C). His blood pressure is 114/80 and his pulse is 87. His respiration is 17 and oxygen saturation is 99%.   Wt Readings from Last 3 Encounters:  08/10/23 183 lb (83 kg)  08/01/23 184 lb (83.5 kg)  02/23/23 181 lb (82.1 kg)     Physical Exam Vitals reviewed.  HENT:     Head: Normocephalic and atraumatic.  Eyes:     Pupils: Pupils are equal, round, and reactive to light.  Cardiovascular:     Rate and Rhythm: Normal rate and regular rhythm.     Heart sounds: Normal heart sounds.  Pulmonary:  Effort: Pulmonary effort is normal.     Breath sounds: Normal breath sounds.  Abdominal:     General: Bowel sounds are normal.     Palpations: Abdomen is soft.  Musculoskeletal:        General: No tenderness or deformity. Normal range of motion.     Cervical back: Normal range of motion.     Comments: Evaluation of the left elbow does show some fluctuance.  There is some erythema.  There is slight tenderness to palpation.  There is no exudate from the eschar.  He has decent range of motion of the elbow.  Lymphadenopathy:     Cervical: No cervical adenopathy.  Skin:    General: Skin is warm and dry.     Findings: No erythema or rash.  Neurological:     Mental Status: He is alert and oriented to person, place, and time.  Psychiatric:        Behavior: Behavior normal.        Thought Content: Thought content normal.        Judgment: Judgment normal.     Lab Results  Component Value Date   WBC 6.5 08/10/2023   HGB 12.3 (L) 08/10/2023   HCT 34.8 (L) 08/10/2023   MCV 91.6 08/10/2023   PLT 147 (L) 08/10/2023     Chemistry      Component Value Date/Time   NA 135 08/10/2023 0917   K 4.8 08/10/2023 0917   CL 102 08/10/2023 0917   CO2 25 08/10/2023 0917   BUN 23 08/10/2023 0917   CREATININE 0.96 08/10/2023 0917      Component Value Date/Time   CALCIUM 9.0 08/10/2023 0917   ALKPHOS 44 08/10/2023 0917   AST 18 08/10/2023 0917   ALT 25 08/10/2023 0917   BILITOT 0.7 08/10/2023 0917      Impression and Plan: Aaron Weaver is a very nice 68 year old white male.  He has diffuse large cell non-Hodgkin's lymphoma.  He has extra nodal disease.  Again, he has a fairly high IPI score.  He has had 8 cycles of  chemotherapy with R-CHOP.    Again, the PET scan looks quite good.  You do not see evidence of recurrent disease.  He will get his Zometa today.  I think we can probably hold off on another PET scan for probably for a good 6 months now.  We will plan to get him through the Holiday season.  I have him come back in January.  He will have his Zometa then.   Josph Macho, MD 4/4/202510:29 AM

## 2023-08-10 NOTE — Patient Instructions (Signed)

## 2023-08-23 DIAGNOSIS — I719 Aortic aneurysm of unspecified site, without rupture: Secondary | ICD-10-CM | POA: Diagnosis not present

## 2023-08-23 DIAGNOSIS — J439 Emphysema, unspecified: Secondary | ICD-10-CM | POA: Diagnosis not present

## 2023-08-23 DIAGNOSIS — C8330 Diffuse large B-cell lymphoma, unspecified site: Secondary | ICD-10-CM | POA: Diagnosis not present

## 2023-08-23 DIAGNOSIS — E7800 Pure hypercholesterolemia, unspecified: Secondary | ICD-10-CM | POA: Diagnosis not present

## 2023-08-23 DIAGNOSIS — I10 Essential (primary) hypertension: Secondary | ICD-10-CM | POA: Diagnosis not present

## 2023-08-23 DIAGNOSIS — H6123 Impacted cerumen, bilateral: Secondary | ICD-10-CM | POA: Diagnosis not present

## 2023-08-23 DIAGNOSIS — E1169 Type 2 diabetes mellitus with other specified complication: Secondary | ICD-10-CM | POA: Diagnosis not present

## 2023-08-23 DIAGNOSIS — E1121 Type 2 diabetes mellitus with diabetic nephropathy: Secondary | ICD-10-CM | POA: Diagnosis not present

## 2023-08-23 DIAGNOSIS — Z0000 Encounter for general adult medical examination without abnormal findings: Secondary | ICD-10-CM | POA: Diagnosis not present

## 2023-08-24 ENCOUNTER — Encounter (HOSPITAL_COMMUNITY)
Admission: RE | Admit: 2023-08-24 | Discharge: 2023-08-24 | Disposition: A | Discharge status: Home / Self Care | Source: Ambulatory Visit | Attending: Hematology & Oncology | Admitting: Hematology & Oncology

## 2023-08-24 DIAGNOSIS — C8338 Diffuse large B-cell lymphoma, lymph nodes of multiple sites: Secondary | ICD-10-CM | POA: Insufficient documentation

## 2023-08-24 DIAGNOSIS — C8330 Diffuse large B-cell lymphoma, unspecified site: Secondary | ICD-10-CM | POA: Diagnosis not present

## 2023-08-24 LAB — GLUCOSE, CAPILLARY: Glucose-Capillary: 196 mg/dL — ABNORMAL HIGH (ref 70–99)

## 2023-08-24 MED ORDER — FLUDEOXYGLUCOSE F - 18 (FDG) INJECTION
9.0000 | Freq: Once | INTRAVENOUS | Status: AC
  Administered 2023-08-24: 9.12 via INTRAVENOUS

## 2023-10-10 ENCOUNTER — Inpatient Hospital Stay: Attending: Hematology & Oncology

## 2023-10-10 VITALS — BP 119/71 | HR 48 | Temp 98.3°F

## 2023-10-10 DIAGNOSIS — C8330 Diffuse large B-cell lymphoma, unspecified site: Secondary | ICD-10-CM | POA: Insufficient documentation

## 2023-10-10 DIAGNOSIS — C8338 Diffuse large B-cell lymphoma, lymph nodes of multiple sites: Secondary | ICD-10-CM

## 2023-10-10 DIAGNOSIS — Z452 Encounter for adjustment and management of vascular access device: Secondary | ICD-10-CM | POA: Diagnosis not present

## 2023-10-10 MED ORDER — HEPARIN SOD (PORK) LOCK FLUSH 100 UNIT/ML IV SOLN
500.0000 [IU] | Freq: Once | INTRAVENOUS | Status: AC
  Administered 2023-10-10: 500 [IU] via INTRAVENOUS

## 2023-10-10 MED ORDER — SODIUM CHLORIDE 0.9% FLUSH
10.0000 mL | INTRAVENOUS | Status: DC | PRN
  Administered 2023-10-10: 10 mL via INTRAVENOUS

## 2023-10-10 NOTE — Patient Instructions (Signed)
 Patient states he has next flush appt .set up and will call if any needs arise.

## 2023-10-16 DIAGNOSIS — D485 Neoplasm of uncertain behavior of skin: Secondary | ICD-10-CM | POA: Diagnosis not present

## 2023-10-16 DIAGNOSIS — L02414 Cutaneous abscess of left upper limb: Secondary | ICD-10-CM | POA: Diagnosis not present

## 2023-10-29 NOTE — Progress Notes (Unsigned)
 Office Note     CC:  follow up Requesting Provider:  Rolinda Millman, MD  HPI: Aaron JONELLE Prier Sr. is a 68 y.o. (June 13, 1955) male who presents for surveillance of endovascular repair of abdominal aortic aneurysm.  This was performed by Dr. Gretta on 07/12/2020.  He denies any new or changing abdominal or back pain.  He also denies any claudication, rest pain, or tissue loss of bilateral lower extremities.  He takes a daily aspirin  and statin.  He is a former smoker who quit about 3 years ago.  Past medical history also significant for insulin -dependent diabetes mellitus as well as diffuse large B cell lymphoma.  He recently had a PET scan which demonstrated no recurrence.  He has a follow-up with Dr. Timmy in January. ***   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   Dysrhythmia    GERD (gastroesophageal reflux disease)    Impacted cerumen    Impotence of organic origin    Lumbago    Lung nodule    MRSA bacteremia 05/31/2021   Multiple rib fractures 06/28/2021   Neutropenic fever (HCC) 08/13/2021   Other and unspecified hyperlipidemia    Routine general medical examination at a health care facility    Sepsis due to gram-negative UTI (HCC) 08/13/2021   Skipped heart beats    Syncope and collapse 06/28/2021   Tobacco use disorder    Type II or unspecified type diabetes mellitus without mention of complication, not stated as uncontrolled    type II   Unspecified essential hypertension     Past Surgical History:  Procedure Laterality Date   ABDOMINAL AORTIC ENDOVASCULAR STENT GRAFT Bilateral 07/12/2020   Procedure: ABDOMINAL  AORTIC ENDOVASCULAR STENT GRAFT;  Surgeon: Gretta Lonni PARAS, MD;  Location: [facility] OR;  Service: Vascular;  Laterality: Bilateral;   AXILLARY LYMPH NODE BIOPSY Right 04/27/2021   Procedure: AXILLARY EXCISIONAL LYMPH NODE BIOPSY;  Surgeon: Dasie Leonor CROME, MD;  Location: [facility];  Service: General;  Laterality: Right;   BACK SURGERY     fracture   CARPECTOMY Right 09/11/2019   Procedure: PROXIMAL ROW CARPECTOMY; RADIAL STYLOIDECTOMY; POSTERIOR INTEROSSIUS NERVE RESECTION;  Surgeon: Murrell Kuba, MD;  Location: [facility];  Service: Orthopedics;  Laterality: Right;  AXILLARY BLOCK   COLONOSCOPY     INGUINAL HERNIA REPAIR     right   IR FLUORO GUIDE CV LINE RIGHT  06/16/2021   IR IMAGING GUIDED PORT INSERTION  07/07/2021   IR US  GUIDE VASC ACCESS LEFT  06/16/2021   POLYPECTOMY     PORT-A-CATH REMOVAL N/A 06/01/2021   Procedure: REMOVAL PORT-A-CATH;  Surgeon: Eletha Boas, MD;  Location: [facility] ORS;  Service: General;  Laterality: N/A;   PORTACATH PLACEMENT Right 05/13/2021   Procedure: INSERTION PORT-A-CATH;  Surgeon: Dasie Leonor CROME, MD;  Location: [facility];  Service: General;  Laterality: Right;   TEE WITHOUT CARDIOVERSION N/A 06/06/2021   Procedure: TRANSESOPHAGEAL ECHOCARDIOGRAM (TEE);  Surgeon: Pietro Redell RAMAN, MD;  Location: [facility] ENDOSCOPY;  Service: Cardiovascular;  Laterality: N/A;   TONSILLECTOMY      Social History   Socioeconomic History   Marital status: Married    Spouse name: Lenon Kuennen   Number of children: 1   Years of education: Not on file   Highest education level: Not on file  Occupational History   Occupation: [employer]  Tobacco Use   Smoking status: Former    Current packs/day: 0.00    Average packs/day: 0.5 packs/day for 53.0 years (26.5 ttl pk-yrs)    Types: Cigarettes    Start date: 03/31/1965    Quit date: 07/22/2017    Years since quitting: 6.2    Passive exposure: Never   Smokeless tobacco: Never   Tobacco comments:    started  at age 40.  Vaping Use   Vaping status: Never Used  Substance and Sexual Activity   Alcohol use: Yes    Comment: rare beer once or twice   Drug use: No   Sexual activity: Yes  Other Topics Concern   Not on file  Social History Narrative   Not on file   Social Drivers of Health   Financial Resource Strain: Not on file  Food Insecurity: Not on file  Transportation Needs: Not on file  Physical Activity: Not on file  Stress: Not on file  Social Connections: Not on file  Intimate Partner Violence: Not on file    Family History  Problem Relation Age of Onset   Colon cancer Mother 57   Hypertension Father    Coronary artery disease Other        male<60 and male<50  1st degree relative   Colon polyps Brother    Esophageal cancer Neg Hx    Rectal cancer Neg Hx    Stomach cancer Neg Hx     Current Outpatient Medications  Medication Sig Dispense Refill   albuterol  (PROAIR  HFA) 108 (90 Base) MCG/ACT inhaler Inhale 1-2 puffs into the lungs every 6 (six) hours as needed for wheezing or shortness of breath. (Patient not taking: Reported on 08/10/2023) 8 g 6   aspirin  EC 81 MG EC tablet Take 1 tablet (81 mg total) by mouth daily at 6 (six) AM. Swallow whole. (Patient taking differently: Take 81 mg by mouth in the morning. Swallow whole.) 30 tablet 11   atenolol  (TENORMIN ) 25 MG tablet Take 25 mg by mouth daily.     blood glucose meter kit and supplies KIT Dispense based on patient and insurance preference. Use up to four times daily as directed. 1 each 0   calcium  carbonate (OSCAL) 1500 (600 Ca) MG TABS tablet Take 600 mg of elemental calcium  by mouth daily with breakfast.     Continuous Glucose Sensor (DEXCOM G7 SENSOR) MISC      diclofenac  Sodium (VOLTAREN ) 1 % GEL Apply 2 g topically daily as needed (to painful sites).     DULoxetine  (CYMBALTA ) 60 MG capsule Take 60 mg by mouth at bedtime.     Fish Oil OIL Take 1,500 mg by mouth daily.     HYDROcodone -acetaminophen  (NORCO) 5-325 MG  tablet Take 1 tablet by mouth every 4 (four) hours as needed for severe pain. (Patient not taking: Reported on 08/10/2023) 15 tablet 0   insulin  glargine (LANTUS ) 100 UNIT/ML Solostar Pen Inject 25 Units into the skin daily. (Patient taking differently: Inject 20 Units into the skin at bedtime.) 15 mL 11   insulin  lispro (HUMALOG ) 100 UNIT/ML KwikPen Inject 2-15 Units into the skin 4 (four) times daily -  after meals and at bedtime. Glucose 121 - 150: 2 units, Glucose 151 - 200: 3 units, Glucose 201 - 250: 5 units, Glucose 251 - 300: 8 units, Glucose 301 - 350: 11 units, Glucose 351 - 400: 15 units, Glucose > 400 call MD 15 mL 11   Insulin  Pen Needle 32G X 4 MM MISC 1 each by Does not apply route 4 (four) times daily - after meals and at bedtime. 100 each 5   lisinopril (ZESTRIL) 5 MG tablet Take 5 mg by mouth daily.     metFORMIN  (GLUCOPHAGE -XR) 500 MG 24 hr tablet Take 2,000 mg by mouth daily with breakfast.     Multiple Vitamin (MULTIVITAMIN ADULT PO) Take 1 tablet by mouth daily.     mupirocin  ointment (BACTROBAN ) 2 % Apply topically at bedtime.     omeprazole (PRILOSEC) 40 MG capsule Take 40 mg by mouth every evening.     potassium chloride  SA (KLOR-CON  M20) 20 MEQ tablet TAKE 1 TABLET (20 MEQ TOTAL) BY MOUTH DAILY.  (Patient taking differently: Take 20 mEq by mouth in the morning.) 30 tablet 5   rosuvastatin  (CRESTOR ) 10 MG tablet Take 10 mg by mouth daily.     tadalafil  (CIALIS ) 5 MG tablet Take 5 mg by mouth in the morning.     tamsulosin  (FLOMAX ) 0.4 MG CAPS capsule Take 0.4 mg by mouth every evening.     tiZANidine (ZANAFLEX) 4 MG tablet Take 4 mg by mouth 2 (two) times daily as needed. (Patient not taking: Reported on 08/10/2023)     traMADol  (ULTRAM ) 50 MG tablet Take 50 mg by mouth 3 (three) times daily as needed. (Patient not taking: Reported on 08/10/2023)     Zinc 50 MG TABS Take 50 mg by mouth daily.     No current facility-administered medications for this visit.    Allergies   Allergen Reactions   Fexofenadine Other (See Comments)    dries me out too much   Pravastatin  Sodium Other (See Comments)    Aches   Requip  [Ropinirole  Hcl] Other (See Comments)    Bad dreams   Hydrochlorothiazide Other (See Comments)    Cramps     REVIEW OF SYSTEMS:   [X]  denotes positive finding, [ ]  denotes negative finding Cardiac  Comments:  Chest pain or chest pressure:    Shortness of breath upon exertion:    Short of breath when lying flat:    Irregular heart rhythm:        Vascular    Pain in calf, thigh, or hip brought on by ambulation:    Pain in feet at night that wakes you up from your sleep:     Blood clot in your veins:    Leg swelling:         Pulmonary    Oxygen at home:    Productive cough:     Wheezing:         Neurologic    Sudden weakness in arms or legs:     Sudden numbness in arms or legs:     Sudden onset of difficulty speaking or slurred speech:    Temporary loss of vision in one eye:     Problems with dizziness:         Gastrointestinal    Blood in stool:     Vomited blood:         Genitourinary    Burning when urinating:     Blood in urine:  Psychiatric    Major depression:         Hematologic    Bleeding problems:    Problems with blood clotting too easily:        Skin    Rashes or ulcers:        Constitutional    Fever or chills:      PHYSICAL EXAMINATION:  There were no vitals filed for this visit.  General:  WDWN in NAD; vital signs documented above Gait: Not observed HENT: WNL, normocephalic Pulmonary: normal non-labored breathing Cardiac: regular HR Abdomen: soft, NT, no masses Skin: without rashes Vascular Exam/Pulses: *** Extremities: {With/Without:20273} ischemic changes, {With/Without:20273} Gangrene , {With/Without:20273} cellulitis; {With/Without:20273} open wounds;  Musculoskeletal: no muscle wasting or atrophy  Neurologic: A&O X 3 Psychiatric:  The pt has Normal affect.   Non-Invasive  Vascular Imaging:   ***    ASSESSMENT/PLAN:: 68 y.o. male here for follow up for surveillance of EVAR   -***   Donnice Sender, PA-C [facility] [phone]  Clinic MD:   ***

## 2023-10-30 ENCOUNTER — Ambulatory Visit (HOSPITAL_COMMUNITY)
Admission: RE | Admit: 2023-10-30 | Discharge: 2023-10-30 | Disposition: A | Discharge status: Home / Self Care | Payer: Medicare Other | Source: Ambulatory Visit | Attending: Vascular Surgery | Admitting: Vascular Surgery

## 2023-10-30 ENCOUNTER — Ambulatory Visit: Payer: Medicare Other | Admitting: Physician Assistant

## 2023-10-30 VITALS — BP 132/68 | Temp 97.9°F | Ht 71.0 in | Wt 182.4 lb

## 2023-10-30 DIAGNOSIS — Z95828 Presence of other vascular implants and grafts: Secondary | ICD-10-CM

## 2023-11-29 NOTE — Progress Notes (Unsigned)
 [facility] Return Visit   Referring Provider Rolinda Millman, MD [phone] [name] [place],  [place] [address]  Primary Care Provider Rolinda Millman, MD  Patient Profile: Aaron Weaver Sr. is a 68 y.o. 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  Problem List: History of adenomatous colon polyps Family history of colorectal cancer in a first-degree relative-mother deceased age 69 Family history of colon polyps-sister GERD   History of Present Illness   Aaron Weaver was last seen in the GI office at the time of colonoscopy 11/2020   Current GI Meds  None  Interval History   -- Aaron Weaver has a pertinent family history of colorectal cancer in his mother -- Reports that she was diagnosed and deceased with colorectal cancer at the age of 19 but it is suspected she had it for a long time before that given that it was advanced and metastatic -- 1 sister with colon polyps -- Family history of bladder and prostate cancer -- No other suspected malignancies to concern for Lynch syndrome  -- He has had regular colonoscopies as outlined under data reviewed that have frequently shown adenomatous colon polyps as well as at least 1 advanced adenoma greater than 1 cm -- Last colonoscopy was performed 11/2020 and disclosed 4 subcentimeter tubular adenomas-3-year recall advised  -- Currently has regular bowel habits stooling once a day -- No diarrhea, constipation, melena or hematochezia -- No abdominal pain or cramping -- Records indicate a prior history of GERD that he states is stable  -- Has tolerated anesthesia for procedures well in the past without any issues -- History of COPD-not on oxygen -quit smoking tobacco 5 years ago -- Rare alcohol  consumption  Last colonoscopy:  11/2020 - Four 5-8 mm polyps (TA), IH Last endoscopy:  None  Last Abd CT/CTE/MRE: PET CT 08/2023-no areas of abnormal radiotracer uptake, persistent stranding about the left kidney at the hilum  GI Review of Symptoms Significant for None. Otherwise negative.  General Review of Systems  Review of systems is significant for the pertinent positives and negatives as listed per the HPI.  Full ROS is otherwise negative.  Past Medical History   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   Dysrhythmia    GERD (gastroesophageal reflux disease)    Impacted cerumen    Impotence of organic origin    Lumbago    Lung nodule    Lymphoma (HCC)    MRSA bacteremia 05/31/2021   Multiple rib fractures 06/28/2021   Neutropenic fever (HCC) 08/13/2021   Other and unspecified hyperlipidemia    Routine general medical examination at a health care facility    Sepsis due to gram-negative UTI (HCC) 08/13/2021   Skipped heart beats    Syncope and collapse 06/28/2021   Tobacco use disorder  Type II or unspecified type diabetes mellitus without mention of complication, not stated as uncontrolled    type II   Unspecified essential hypertension      Past Surgical History   Past Surgical History:  Procedure Laterality Date   ABDOMINAL AORTIC ENDOVASCULAR STENT GRAFT Bilateral 07/12/2020   Procedure: ABDOMINAL AORTIC ENDOVASCULAR STENT GRAFT;  Surgeon: Gretta Lonni PARAS, MD;  Location: [facility] OR;  Service: Vascular;  Laterality: Bilateral;   AXILLARY LYMPH NODE BIOPSY Right 04/27/2021   Procedure: AXILLARY  EXCISIONAL LYMPH NODE BIOPSY;  Surgeon: Dasie Leonor CROME, MD;  Location: [facility];  Service: General;  Laterality: Right;   BACK SURGERY     fracture   CARPECTOMY Right 09/11/2019   Procedure: PROXIMAL ROW CARPECTOMY; RADIAL STYLOIDECTOMY; POSTERIOR INTEROSSIUS NERVE RESECTION;  Surgeon: Murrell Kuba, MD;  Location: [facility];  Service: Orthopedics;  Laterality: Right;  AXILLARY BLOCK   COLONOSCOPY     INGUINAL HERNIA REPAIR     right   IR FLUORO GUIDE CV LINE RIGHT  06/16/2021   IR IMAGING GUIDED PORT INSERTION  07/07/2021   IR US  GUIDE VASC ACCESS LEFT  06/16/2021   POLYPECTOMY     PORT-A-CATH REMOVAL N/A 06/01/2021   Procedure: REMOVAL PORT-A-CATH;  Surgeon: Eletha Boas, MD;  Location: [facility] ORS;  Service: General;  Laterality: N/A;   PORTACATH PLACEMENT Right 05/13/2021   Procedure: INSERTION PORT-A-CATH;  Surgeon: Dasie Leonor CROME, MD;  Location: [facility];  Service: General;  Laterality: Right;   TEE WITHOUT CARDIOVERSION N/A 06/06/2021   Procedure: TRANSESOPHAGEAL ECHOCARDIOGRAM (TEE);  Surgeon: Pietro Redell RAMAN, MD;  Location: [facility];  Service: Cardiovascular;  Laterality: N/A;   TONSILLECTOMY       Allergies and Medications   Allergies  Allergen Reactions   Fexofenadine Other (See Comments)    dries me out too much   Pravastatin  Sodium Other (See Comments)    Aches   Requip  [Ropinirole  Hcl] Other (See Comments)    Bad dreams   Hydrochlorothiazide Other (See Comments)    Cramps   Current Meds  Medication Sig   albuterol  (PROAIR  HFA) 108 (90 Base) MCG/ACT inhaler Inhale 1-2 puffs into the lungs every 6 (six) hours as needed for wheezing or shortness of breath.   aspirin  EC 81 MG EC tablet Take 1 tablet (81 mg total) by mouth daily at 6 (six) AM. Swallow whole. (Patient taking differently: Take 81 mg by mouth in the morning. Swallow whole.)   atenolol  (TENORMIN ) 25 MG tablet Take 25 mg by mouth daily.   blood glucose meter kit and supplies KIT  Dispense based on patient and insurance preference. Use up to four times daily as directed.   calcium  carbonate (OSCAL) 1500 (600 Ca) MG TABS tablet Take 600 mg of elemental calcium  by mouth daily with breakfast.   Continuous Glucose Sensor (DEXCOM G7 SENSOR) MISC    diclofenac  Sodium (VOLTAREN ) 1 % GEL Apply 2 g topically daily as needed (to painful sites).   DULoxetine  (CYMBALTA ) 60 MG capsule Take 60 mg by mouth at bedtime.   Fish Oil OIL Take 1,500 mg by mouth daily.   glipiZIDE (GLUCOTROL XL) 5 MG 24 hr tablet Take 5 mg by mouth daily.   HYDROcodone -acetaminophen  (NORCO) 5-325 MG tablet Take 1 tablet by mouth every 4 (four) hours as needed for severe pain.   insulin  glargine (LANTUS ) 100 UNIT/ML Solostar Pen Inject 25 Units into the skin daily. (Patient taking differently: Inject 20 Units into the skin at  bedtime.)   insulin  lispro (HUMALOG ) 100 UNIT/ML KwikPen Inject 2-15 Units into the skin 4 (four) times daily - after meals and at bedtime. Glucose 121 - 150: 2 units, Glucose 151 - 200: 3 units, Glucose 201 - 250: 5 units, Glucose 251 - 300: 8 units, Glucose 301 - 350: 11 units, Glucose 351 - 400: 15 units, Glucose > 400 call MD   Insulin  Pen Needle 32G X 4 MM MISC 1 each by Does not apply route 4 (four) times daily - after meals and at bedtime.   lisinopril (ZESTRIL) 5 MG tablet Take 5 mg by mouth daily.   metFORMIN  (GLUCOPHAGE -XR) 500 MG 24 hr tablet Take 2,000 mg by mouth daily with breakfast.   Multiple Vitamin (MULTIVITAMIN ADULT PO) Take 1 tablet by mouth daily.   mupirocin  ointment (BACTROBAN ) 2 % Apply topically at bedtime.   omeprazole (PRILOSEC) 40 MG capsule Take 40 mg by mouth every evening.   potassium chloride  SA (KLOR-CON  M20) 20 MEQ tablet TAKE 1 TABLET (20 MEQ TOTAL) BY MOUTH DAILY.  (Patient taking differently: Take 20 mEq by mouth in the morning.)   rosuvastatin  (CRESTOR ) 10 MG tablet Take 10 mg by mouth daily.   tadalafil  (CIALIS ) 5 MG tablet Take 5 mg by mouth in the  morning.   tamsulosin  (FLOMAX ) 0.4 MG CAPS capsule Take 0.4 mg by mouth every evening.   tiZANidine (ZANAFLEX) 4 MG tablet Take 4 mg by mouth 2 (two) times daily as needed.   traMADol  (ULTRAM ) 50 MG tablet Take 50 mg by mouth 3 (three) times daily as needed.   Zinc 50 MG TABS Take 50 mg by mouth daily.     Family His   Family History  Problem Relation Age of Onset   Colon cancer Mother 35   Hypertension Father    Coronary artery disease Other        male<60 and male<50  1st degree relative   Colon polyps Brother    Esophageal cancer Neg Hx    Rectal cancer Neg Hx    Stomach cancer Neg Hx     Social History   Social History   Tobacco Use   Smoking status: Former    Current packs/day: 0.00    Average packs/day: 0.5 packs/day for 53.0 years (26.5 ttl pk-yrs)    Types: Cigarettes    Start date: 03/31/1965    Quit date: 07/22/2017    Years since quitting: 6.3    Passive exposure: Never   Smokeless tobacco: Never   Tobacco comments:    started at age 78.  Vaping Use   Vaping status: Never Used  Substance Use Topics   Alcohol use: Yes    Comment: rare beer once or twice   Drug use: No   [place] reports that he quit smoking about 6 years ago. His smoking use included cigarettes. He started smoking about 58 years ago. He has a 26.5 pack-year smoking history. He has never been exposed to tobacco smoke. He has never used smokeless tobacco. He reports current alcohol use. He reports that he does not use drugs.  Vital Signs and Physical Examination   Vitals:   11/30/23 1006  BP: 128/80  Pulse: 92   Body mass index is 26.01 kg/m. Weight: 181 lb 4 oz (82.2 kg)  General: Well developed, well nourished, no acute distress Head: Normocephalic and atraumatic Eyes: Sclerae anicteric, EOMI Lungs: Clear throughout to auscultation Heart: Regular rate and rhythm; No murmurs, rubs or bruits Abdomen: Soft, non  tender and non distended. No masses, hepatosplenomegaly or hernias noted.  Normal Bowel sounds Rectal: Deferred Musculoskeletal: Symmetrical with no gross deformities    Review of Data   The following data was reviewed at the time of this encounter:   Laboratory Studies      Latest Ref Rng & Units 08/10/2023    9:17 AM 02/23/2023    1:10 PM 11/29/2022    2:55 PM  CBC  WBC 4.0 - 10.5 K/uL 6.5  5.7  6.6   Hemoglobin 13.0 - 17.0 g/dL 87.6  88.5  87.8   Hematocrit 39.0 - 52.0 % 34.8  32.9  35.0   Platelets 150 - 400 K/uL 147  130  154     Lab Results  Component Value Date   LIPASE 31 08/13/2021      Latest Ref Rng & Units 08/10/2023    9:17 AM 02/23/2023    1:10 PM 11/29/2022    2:55 PM  CMP  Glucose 70 - 99 mg/dL 810  746  885   BUN 8 - 23 mg/dL 23  23  20    Creatinine 0.61 - 1.24 mg/dL 9.03  8.99  9.08   Sodium 135 - 145 mmol/L 135  134  137   Potassium 3.5 - 5.1 mmol/L 4.8  4.5  4.3   Chloride 98 - 111 mmol/L 102  101  102   CO2 22 - 32 mmol/L 25  24  24    Calcium  8.9 - 10.3 mg/dL 9.0  8.7  9.0   Total Protein 6.5 - 8.1 g/dL 6.9  6.8  7.1   Total Bilirubin 0.0 - 1.2 mg/dL 0.7  0.5  0.5   Alkaline Phos 38 - 126 U/L 44  45  53   AST 15 - 41 U/L 18  22  29    ALT 0 - 44 U/L 25  22  36      Imaging Studies  NM PET CT 08/2023 No areas of abnormal radiotracer uptake including spleen, osseous structures or lymph nodes.   Persistent stranding about the left kidney at the hilum. Please correlate with history.   Old rib fractures.  Aortic endograft with aneurysm.  GI Procedures and Studies  Colonoscopy 11/2020 Four 5-8 mm polyps (TA), IH  Colonoscopy 10/2017 12 mm polyp at hepatic flexure Four 6-7 mm polyps in TC, cecum All polyps TA  Colonoscopy 08/2012 Two 5-7 mm polyps at hepatic flexure and TC, IH (TA)  Colonoscopy 07/2007 4 mm polyp in descending colon (TA)  Colonoscopy 05/2002 Normal   Clinical Impression  It is my clinical impression that Aaron Weaver is a 68 y.o. male with;  Family history of adenomatous colon polyps Family  history of colorectal cancer in a first-degree relative-mother deceased age 30 Family history of colon polyps-sister GERD  Aaron Weaver presents to discuss surveillance colonoscopy in the setting of family history of colon cancer.  His mother was diagnosed with colon cancer around the age of 78 and died shortly thereafter as the disease was widespread and metastatic.  He has had a sister with colon polyps.    He has had a personal history of colon polyps on multiple colonoscopies-mostly nonadvanced tubular adenomas but did have at least 1 tubular adenoma greater than 1 cm in 2019.  Last colonoscopy performed in 2022 disclosed for subcentimeter tubular adenomas with a 3-year recall.  He is currently due for colonoscopy and is agreeable to proceeding.  Stable from a cardiopulmonary perspective.  Records suggest a  diagnosis of paroxysmal A-fib for which he takes aspirin  but no other anticoagulants.  Plan  Schedule surveillance  colonoscopy at [facility] GERD stable without medications-continue lifestyle modification no new interventions today  Planned Follow Up PRN  The patient or caregiver verbalized understanding of the material covered, with no barriers to understanding. All questions were answered. Patient or caregiver is agreeable with the plan outlined above.    It was a pleasure to see Aaron Weaver.  If you have any questions or concerns regarding this evaluation, do not hesitate to contact me.  Inocente Hausen, MD [facility]

## 2023-11-30 ENCOUNTER — Encounter: Payer: Self-pay | Admitting: Pediatrics

## 2023-11-30 ENCOUNTER — Ambulatory Visit: Admitting: Pediatrics

## 2023-11-30 VITALS — BP 128/80 | HR 92 | Ht 70.0 in | Wt 181.2 lb

## 2023-11-30 DIAGNOSIS — K219 Gastro-esophageal reflux disease without esophagitis: Secondary | ICD-10-CM | POA: Diagnosis not present

## 2023-11-30 DIAGNOSIS — Z860101 Personal history of adenomatous and serrated colon polyps: Secondary | ICD-10-CM | POA: Diagnosis not present

## 2023-11-30 DIAGNOSIS — Z800 Family history of malignant neoplasm of digestive organs: Secondary | ICD-10-CM | POA: Diagnosis not present

## 2023-11-30 DIAGNOSIS — Z83719 Family history of colon polyps, unspecified: Secondary | ICD-10-CM

## 2023-11-30 DIAGNOSIS — Z860100 Personal history of colon polyps, unspecified: Secondary | ICD-10-CM

## 2023-11-30 NOTE — Patient Instructions (Signed)
 You have been scheduled for a colonoscopy. Please follow written instructions given to you at your visit today.   If you use inhalers (even only as needed), please bring them with you on the day of your procedure.  DO NOT TAKE 7 DAYS PRIOR TO TEST- Trulicity (dulaglutide) Ozempic, Wegovy (semaglutide) Mounjaro (tirzepatide) Bydureon Bcise (exanatide extended release)  DO NOT TAKE 1 DAY PRIOR TO YOUR TEST Rybelsus (semaglutide) Adlyxin (lixisenatide) Victoza (liraglutide) Byetta (exanatide) ___________________________________________________________________________   Thank you for entrusting me with your care and for choosing [facility], Dr. Inocente Hausen  _______________________________________________________  If your blood pressure at your visit was 140/90 or greater, please contact your primary care physician to follow up on this.  _______________________________________________________  If you are age 42 or older, your body mass index should be between 23-30. Your Body mass index is 26.01 kg/m. If this is out of the aforementioned range listed, please consider follow up with your Primary Care Provider.  If you are age 67 or younger, your body mass index should be between 19-25. Your Body mass index is 26.01 kg/m. If this is out of the aformentioned range listed, please consider follow up with your Primary Care Provider.   ________________________________________________________  The [facility] providers would like to encourage you to use MYCHART to communicate with providers for non-urgent requests or questions.  Due to long hold times on the telephone, sending your provider a message by [facility] may be a faster and more efficient way to get a response.  Please allow 48 business hours for a response.  Please remember that this is for non-urgent requests.  _______________________________________________________  [facility] is using a team-based approach  to care.  Your team is made up of your doctor and two to three APPS. Our APPS (Nurse Practitioners and Physician Assistants) work with your physician to ensure care continuity for you. They are fully qualified to address your health concerns and develop a treatment plan. They communicate directly with your gastroenterologist to care for you. Seeing the Advanced Practice Practitioners on your physician's team can help you by facilitating care more promptly, often allowing for earlier appointments, access to diagnostic testing, procedures, and other specialty referrals.

## 2023-12-04 ENCOUNTER — Telehealth: Payer: Self-pay | Admitting: Pediatrics

## 2023-12-04 NOTE — Telephone Encounter (Signed)
 Patient is calling due to him having an up coming procedure that is scheduled for August the 4 th. Patient stated that he still has not received his prep medication for [facility] on [address]. Please advise.

## 2023-12-05 MED ORDER — NA SULFATE-K SULFATE-MG SULF 17.5-3.13-1.6 GM/177ML PO SOLN
1.0000 | Freq: Once | ORAL | 0 refills | Status: AC
Start: 2023-12-05 — End: 2023-12-05

## 2023-12-05 NOTE — Telephone Encounter (Signed)
 I spoke to Aaron Weaver and I advised him that I sent in the prep for his procedure.  He stated that he will pick it up tomorrow.

## 2023-12-09 NOTE — Progress Notes (Unsigned)
 [facility] History and Physical   Primary Care Physician:  Rolinda Millman, MD   Reason for Procedure:  History of multiple colon tubular adenomas, tubular adenoma greater than 1 cm, family history of colorectal cancer in first-degree relative-mother diagnosed in her 93s, family history of colon polyps  Plan:    Surveillance colonoscopy     HPI: Aaron REILAND Sr. is a 68 y.o. male undergoing surveillance colonoscopy for a history of multiple colon tubular adenomas, tubular adenoma greater than 1 cm.  Last colonoscopy in 2022 discloses 4 tubular adenomas.  Colonoscopy in 2019 disclosed multiple tubular adenomas as well as a 12 mm polyp at the hepatic flexure that was adenomatous on pathology.  Patient reports that his mother was diagnosed with colorectal cancer and deceased fairly quickly after diagnosis at the age of 29.  His sister has had colon polyps.  Currently denies change in bowel habits or rectal bleeding.   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   Dysrhythmia    GERD (gastroesophageal reflux disease)    Impacted cerumen    Impotence of organic origin    Lumbago    Lung nodule    Lymphoma (HCC)    MRSA bacteremia 05/31/2021   Multiple rib fractures 06/28/2021   Neutropenic fever (HCC) 08/13/2021   Other and unspecified hyperlipidemia    Routine general medical examination at a health care facility    Sepsis due to gram-negative UTI (HCC) 08/13/2021   Skipped heart beats    Syncope and collapse 06/28/2021   Tobacco use disorder    Type II or unspecified type diabetes mellitus without mention  of complication, not stated as uncontrolled    type II   Unspecified essential hypertension     Past Surgical History:  Procedure Laterality Date   ABDOMINAL AORTIC ENDOVASCULAR STENT GRAFT Bilateral 07/12/2020   Procedure: ABDOMINAL AORTIC ENDOVASCULAR STENT GRAFT;  Surgeon: Gretta Lonni PARAS, MD;  Location: [facility] OR;  Service: Vascular;  Laterality: Bilateral;   AXILLARY LYMPH NODE BIOPSY Right 04/27/2021   Procedure: AXILLARY EXCISIONAL LYMPH NODE BIOPSY;  Surgeon: Dasie Leonor CROME, MD;  Location: [facility];  Service: General;  Laterality: Right;   BACK SURGERY     fracture   CARPECTOMY Right 09/11/2019   Procedure: PROXIMAL ROW CARPECTOMY; RADIAL STYLOIDECTOMY; POSTERIOR INTEROSSIUS NERVE RESECTION;  Surgeon: Murrell Kuba, MD;  Location: [facility];  Service: Orthopedics;  Laterality: Right;  AXILLARY BLOCK   COLONOSCOPY     INGUINAL HERNIA REPAIR     right   IR FLUORO GUIDE CV LINE RIGHT  06/16/2021   IR IMAGING GUIDED PORT INSERTION  07/07/2021   IR US  GUIDE VASC ACCESS LEFT  06/16/2021   POLYPECTOMY     PORT-A-CATH REMOVAL N/A 06/01/2021   Procedure: REMOVAL PORT-A-CATH;  Surgeon: Eletha Boas, MD;  Location: [facility] ORS;  Service: General;  Laterality: N/A;   PORTACATH PLACEMENT Right 05/13/2021   Procedure: INSERTION PORT-A-CATH;  Surgeon: Dasie Leonor CROME, MD;  Location: [facility] ORS;  Service:  General;  Laterality: Right;   TEE WITHOUT CARDIOVERSION N/A 06/06/2021   Procedure: TRANSESOPHAGEAL ECHOCARDIOGRAM (TEE);  Surgeon: Pietro Redell RAMAN, MD;  Location: [facility];  Service: Cardiovascular;  Laterality: N/A;   TONSILLECTOMY      Prior to Admission medications   Medication Sig Start Date End Date Taking? Authorizing Provider  albuterol  (PROAIR  HFA) 108 (90 Base) MCG/ACT inhaler Inhale 1-2 puffs into the lungs every 6 (six) hours as needed for wheezing or shortness of breath. 10/15/20   Shelah Lamar RAMAN, MD  aspirin  EC 81 MG EC tablet Take 1 tablet (81 mg total)  by mouth daily at 6 (six) AM. Swallow whole. Patient taking differently: Take 81 mg by mouth in the morning. Swallow whole. 07/14/20   Gerome Maurilio HERO, PA-C  atenolol  (TENORMIN ) 25 MG tablet Take 25 mg by mouth daily. 01/27/22   [provider]  blood glucose meter kit and supplies KIT Dispense based on patient and insurance preference. Use up to four times daily as directed. 06/08/21   Patsy Lenis, MD  calcium  carbonate (OSCAL) 1500 (600 Ca) MG TABS tablet Take 600 mg of elemental calcium  by mouth daily with breakfast.    [provider]  Continuous Glucose Sensor (DEXCOM G7 SENSOR) MISC  03/27/23   [provider]  diclofenac  Sodium (VOLTAREN ) 1 % GEL Apply 2 g topically daily as needed (to painful sites).    [provider]  DULoxetine  (CYMBALTA ) 60 MG capsule Take 60 mg by mouth at bedtime. 04/01/18   [provider]  Fish Oil OIL Take 1,500 mg by mouth daily.    [provider]  glipiZIDE (GLUCOTROL XL) 5 MG 24 hr tablet Take 5 mg by mouth daily. 11/09/23   [provider]  HYDROcodone -acetaminophen  (NORCO) 5-325 MG tablet Take 1 tablet by mouth every 4 (four) hours as needed for severe pain. 09/14/22   Freddi Hamilton, MD  insulin  glargine (LANTUS ) 100 UNIT/ML Solostar Pen Inject 25 Units into the skin daily. Patient taking differently: Inject 20 Units into the skin at bedtime. 06/30/21   Akula, Vijaya, MD  Insulin  Pen Needle 32G X 4 MM MISC 1 each by Does not apply route 4 (four) times daily - after meals and at bedtime. 06/08/21   Patsy Lenis, MD  lisinopril (ZESTRIL) 5 MG tablet Take 5 mg by mouth daily. 08/22/22   [provider]  metFORMIN  (GLUCOPHAGE -XR) 500 MG 24 hr tablet Take 2,000 mg by mouth daily with breakfast. 06/14/22   [provider]  Multiple Vitamin (MULTIVITAMIN ADULT PO) Take 1 tablet by mouth daily.    [provider]  mupirocin  ointment (BACTROBAN ) 2 % Apply topically at bedtime. 07/19/23    [provider]  omeprazole (PRILOSEC) 40 MG capsule Take 40 mg by mouth every evening.    [provider]  potassium chloride  SA (KLOR-CON  M20) 20 MEQ tablet TAKE 1 TABLET (20 MEQ TOTAL) BY MOUTH DAILY.  Patient taking differently: Take 20 mEq by mouth in the morning. 02/13/13   Plotnikov, Karlynn GAILS, MD  rosuvastatin  (CRESTOR ) 10 MG tablet Take 10 mg by mouth daily. 03/16/20   [provider]  tadalafil  (CIALIS ) 5 MG tablet Take 5 mg by mouth in the morning.    [provider]  tamsulosin  (FLOMAX ) 0.4 MG CAPS capsule Take 0.4 mg by mouth every evening.    [provider]  tiZANidine (ZANAFLEX) 4 MG tablet Take 4 mg by mouth 2 (two) times daily as needed. 07/27/22  [provider]  traMADol  (ULTRAM ) 50 MG tablet Take 50 mg by mouth 3 (three) times daily as needed. 05/18/22   [provider]  Zinc 50 MG TABS Take 50 mg by mouth daily.    [provider]  prochlorperazine  (COMPAZINE ) 10 MG tablet Take 1 tablet (10 mg total) by mouth every 6 (six) hours as needed (Nausea or vomiting). 05/17/21 11/04/21  Timmy Maude SAUNDERS, MD    Current Outpatient Medications  Medication Sig Dispense Refill   albuterol  (PROAIR  HFA) 108 (90 Base) MCG/ACT inhaler Inhale 1-2 puffs into the lungs every 6 (six) hours as needed for wheezing or shortness of breath. 8 g 6   aspirin  EC 81 MG EC tablet Take 1 tablet (81 mg total) by mouth daily at 6 (six) AM. Swallow whole. (Patient taking differently: Take 81 mg by mouth in the morning. Swallow whole.) 30 tablet 11   atenolol  (TENORMIN ) 25 MG tablet Take 25 mg by mouth daily.     blood glucose meter kit and supplies KIT Dispense based on patient and insurance preference. Use up to four times daily as directed. 1 each 0   calcium  carbonate (OSCAL) 1500 (600 Ca) MG TABS tablet Take 600 mg of elemental calcium  by mouth daily with breakfast.     Continuous Glucose Sensor (DEXCOM G7 SENSOR) MISC      diclofenac   Sodium (VOLTAREN ) 1 % GEL Apply 2 g topically daily as needed (to painful sites).     DULoxetine  (CYMBALTA ) 60 MG capsule Take 60 mg by mouth at bedtime.     Fish Oil OIL Take 1,500 mg by mouth daily.     glipiZIDE (GLUCOTROL XL) 5 MG 24 hr tablet Take 5 mg by mouth daily.     HYDROcodone -acetaminophen  (NORCO) 5-325 MG tablet Take 1 tablet by mouth every 4 (four) hours as needed for severe pain. 15 tablet 0   insulin  glargine (LANTUS ) 100 UNIT/ML Solostar Pen Inject 25 Units into the skin daily. (Patient taking differently: Inject 20 Units into the skin at bedtime.) 15 mL 11   Insulin  Pen Needle 32G X 4 MM MISC 1 each by Does not apply route 4 (four) times daily - after meals and at bedtime. 100 each 5   lisinopril (ZESTRIL) 5 MG tablet Take 5 mg by mouth daily.     metFORMIN  (GLUCOPHAGE -XR) 500 MG 24 hr tablet Take 2,000 mg by mouth daily with breakfast.     Multiple Vitamin (MULTIVITAMIN ADULT PO) Take 1 tablet by mouth daily.     mupirocin  ointment (BACTROBAN ) 2 % Apply topically at bedtime.     omeprazole (PRILOSEC) 40 MG capsule Take 40 mg by mouth every evening.     potassium chloride  SA (KLOR-CON  M20) 20 MEQ tablet TAKE 1 TABLET (20 MEQ TOTAL) BY MOUTH DAILY.  (Patient taking differently: Take 20 mEq by mouth in the morning.) 30 tablet 5   rosuvastatin  (CRESTOR ) 10 MG tablet Take 10 mg by mouth daily.     tadalafil  (CIALIS ) 5 MG tablet Take 5 mg by mouth in the morning.     tamsulosin  (FLOMAX ) 0.4 MG CAPS capsule Take 0.4 mg by mouth every evening.     tiZANidine (ZANAFLEX) 4 MG tablet Take 4 mg by mouth 2 (two) times daily as needed.     traMADol  (ULTRAM ) 50 MG tablet Take 50 mg by mouth 3 (three) times daily as needed.     Zinc 50 MG TABS Take 50 mg by mouth daily.  No current facility-administered medications for this visit.    Allergies as of 12/10/2023 - Review Complete 11/30/2023  Allergen Reaction Noted   Fexofenadine Other (See Comments) 03/14/2007   Pravastatin  sodium  Other (See Comments) 03/14/2007   Requip  [ropinirole  hcl] Other (See Comments) 04/03/2012   Hydrochlorothiazide Other (See Comments) 06/18/2008    Family History  Problem Relation Age of Onset   Colon cancer Mother 49   Hypertension Father    Coronary artery disease Other        male<60 and male<50  1st degree relative   Colon polyps Brother    Esophageal cancer Neg Hx    Rectal cancer Neg Hx    Stomach cancer Neg Hx     Social History   Socioeconomic History   Marital status: Married    Spouse name: Aaron Weaver   Number of children: 1   Years of education: Not on file   Highest education level: Not on file  Occupational History   Occupation: [employer]  Tobacco Use   Smoking status: Former    Current packs/day: 0.00    Average packs/day: 0.5 packs/day for 53.0 years (26.5 ttl pk-yrs)    Types: Cigarettes    Start date: 03/31/1965    Quit date: 07/22/2017    Years since quitting: 6.3    Passive exposure: Never   Smokeless tobacco: Never   Tobacco comments:    started at age 49.  Vaping Use   Vaping status: Never Used  Substance and Sexual Activity   Alcohol use: Yes    Comment: rare beer once or twice   Drug use: No   Sexual activity: Yes  Other Topics Concern   Not on file  Social History Narrative   Not on file   Social Drivers of Health   Financial Resource Strain: Not on file  Food Insecurity: Not on file  Transportation Needs: Not on file  Physical Activity: Not on file  Stress: Not on file  Social Connections: Not on file  Intimate Partner Violence: Not on file    Review of Systems:  All other review of systems negative except as mentioned in the HPI.  Physical Exam: Vital signs There were no vitals taken for this visit.  General:   Alert,  Well-developed, well-nourished, pleasant and cooperative in NAD Airway:  Mallampati  Lungs:  Clear throughout to auscultation.   Heart:  Regular rate and rhythm; no murmurs, clicks, rubs,  or  gallops. Abdomen:  Soft, nontender and nondistended. Normal bowel sounds.   Neuro/Psych:  Normal mood and affect. A and O x 3  Inocente Hausen, MD [facility]

## 2023-12-10 ENCOUNTER — Inpatient Hospital Stay

## 2023-12-10 ENCOUNTER — Encounter: Payer: Self-pay | Admitting: Pediatrics

## 2023-12-10 ENCOUNTER — Ambulatory Visit (AMBULATORY_SURGERY_CENTER): Admitting: Pediatrics

## 2023-12-10 VITALS — BP 95/62 | HR 85 | Temp 97.5°F | Resp 17 | Ht 70.0 in | Wt 181.0 lb

## 2023-12-10 DIAGNOSIS — D122 Benign neoplasm of ascending colon: Secondary | ICD-10-CM | POA: Diagnosis not present

## 2023-12-10 DIAGNOSIS — K648 Other hemorrhoids: Secondary | ICD-10-CM | POA: Diagnosis not present

## 2023-12-10 DIAGNOSIS — Z860101 Personal history of adenomatous and serrated colon polyps: Secondary | ICD-10-CM

## 2023-12-10 DIAGNOSIS — D123 Benign neoplasm of transverse colon: Secondary | ICD-10-CM | POA: Diagnosis not present

## 2023-12-10 DIAGNOSIS — Z800 Family history of malignant neoplasm of digestive organs: Secondary | ICD-10-CM | POA: Diagnosis not present

## 2023-12-10 DIAGNOSIS — Z860100 Personal history of colon polyps, unspecified: Secondary | ICD-10-CM

## 2023-12-10 DIAGNOSIS — K644 Residual hemorrhoidal skin tags: Secondary | ICD-10-CM | POA: Diagnosis not present

## 2023-12-10 DIAGNOSIS — J449 Chronic obstructive pulmonary disease, unspecified: Secondary | ICD-10-CM | POA: Diagnosis not present

## 2023-12-10 DIAGNOSIS — Z1211 Encounter for screening for malignant neoplasm of colon: Secondary | ICD-10-CM | POA: Diagnosis not present

## 2023-12-10 DIAGNOSIS — Z83719 Family history of colon polyps, unspecified: Secondary | ICD-10-CM

## 2023-12-10 MED ORDER — SODIUM CHLORIDE 0.9 % IV SOLN: 500.0000 mL | Freq: Once | INTRAVENOUS | Status: DC

## 2023-12-10 NOTE — Patient Instructions (Addendum)
 Repeat colonoscopy for surveillance based on                            pathology results.                           - The findings and recommendations were discussed                            with the patient's family.                           - Patient has a contact number available for                            emergencies. The signs and symptoms of potential                            delayed complications were discussed with the                            patient. Return to normal activities tomorrow.                            Written discharge instructions were provided to the                            patient.  Handout on polyps given.    YOU HAD AN ENDOSCOPIC PROCEDURE TODAY AT THE [facility]:   Refer to the procedure report that was given to you for any specific questions about what was found during the examination.  If the procedure report does not answer your questions, please call your gastroenterologist to clarify.  If you requested that your care partner not be given the details of your procedure findings, then the procedure report has been included in a sealed envelope for you to review at your convenience later.  YOU SHOULD EXPECT: Some feelings of bloating in the abdomen. Passage of more gas than usual.  Walking can help get rid of the air that was put into your GI tract during the procedure and reduce the bloating. If you had a lower endoscopy (such as a colonoscopy or flexible sigmoidoscopy) you may notice spotting of blood in your stool or on the toilet paper. If you underwent a bowel prep for your procedure, you may not have a normal bowel movement for a few days.  Please Note:  You might notice some irritation and congestion in your nose or some drainage.  This is from the oxygen used during your procedure.  There is no need for concern and it should clear up in a day or so.  SYMPTOMS TO REPORT IMMEDIATELY:  Following lower endoscopy (colonoscopy or  flexible sigmoidoscopy):  Excessive amounts of blood in the stool  Significant tenderness or worsening of abdominal pains  Swelling of the abdomen that is new, acute  Fever of 100F or higher   For urgent or emergent issues, a gastroenterologist can be reached at any hour by calling [phone]. Do not use MyChart messaging for urgent concerns.    DIET:  We do  recommend a small meal at first, but then you may proceed to your regular diet.  Drink plenty of fluids but you should avoid alcoholic beverages for 24 hours.  ACTIVITY:  You should plan to take it easy for the rest of today and you should NOT DRIVE or use heavy machinery until tomorrow (because of the sedation medicines used during the test).    FOLLOW UP: Our staff will call the number listed on your records the next business day following your procedure.  We will call around 7:15- 8:00 am to check on you and address any questions or concerns that you may have regarding the information given to you following your procedure. If we do not reach you, we will leave a message.     If any biopsies were taken you will be contacted by phone or by letter within the next 1-3 weeks.  Please call us  at [phone] if you have not heard about the biopsies in 3 weeks.    SIGNATURES/CONFIDENTIALITY: You and/or your care partner have signed paperwork which will be entered into your electronic medical record.  These signatures attest to the fact that that the information above on your After Visit Summary has been reviewed and is understood.  Full responsibility of the confidentiality of this discharge information lies with you and/or your care-partner.

## 2023-12-10 NOTE — Progress Notes (Signed)
 Pt resting comfortably. VSS. Airway intact. SBAR complete to RN. All questions answered.

## 2023-12-10 NOTE — Progress Notes (Signed)
 Pt's states no medical or surgical changes since previsit or office visit.

## 2023-12-10 NOTE — Op Note (Addendum)
 [facility] Patient Name: Aaron Weaver Procedure Date: 12/10/2023 7:01 AM MRN: [id number] Endoscopist: Inocente Hausen , MD, [id number] Age: 68 Referring MD:  Date of Birth: 30-Nov-1955 Gender: Male Account #: [id number] Procedure:                Colonoscopy Indications:              High risk colon cancer surveillance: Personal                            history of adenoma (10 mm or greater in size), High                            risk colon cancer surveillance: Personal history of                            multiple (3 or more) adenomas Medicines:                Monitored Anesthesia Care Procedure:                Pre-Anesthesia Assessment:                           - Prior to the procedure, a History and Physical                            was performed, and patient medications and                            allergies were reviewed. The patient's tolerance of                            previous anesthesia was also reviewed. The risks                            and benefits of the procedure and the sedation                            options and risks were discussed with the patient.                            All questions were answered, and informed consent                            was obtained. Prior Anticoagulants: The patient has                            taken no anticoagulant or antiplatelet agents. ASA                            Grade Assessment: III - A patient with severe                            systemic disease. After reviewing the risks and  benefits, the patient was deemed in satisfactory                            condition to undergo the procedure.                           After obtaining informed consent, the colonoscope                            was passed under direct vision. Throughout the                            procedure, the patient's blood pressure, pulse, and                            oxygen saturations were  monitored continuously. The                            Olympus Scope SN: [id number] was introduced through                            the anus and advanced to the cecum, identified by                            appendiceal orifice and ileocecal valve. The                            colonoscopy was performed without difficulty. The                            patient tolerated the procedure well. The quality                            of the bowel preparation was good. The ileocecal                            valve, appendiceal orifice, and rectum were                            photographed. Scope In: 7:59:47 AM Scope Out: 8:14:55 AM Scope Withdrawal Time: 0 hours 13 minutes 0 seconds  Total Procedure Duration: 0 hours 15 minutes 8 seconds  Findings:                 Hemorrhoids were found on perianal exam.                           The digital rectal exam was normal. Pertinent                            negatives include normal sphincter tone and no                            palpable rectal lesions.  Three sessile polyps were found in the transverse                            colon and ascending colon. The polyps were 3 to 5                            mm in size. These polyps were removed with a cold                            biopsy forceps. Resection and retrieval were                            complete.                           Internal hemorrhoids were found during retroflexion. Complications:            No immediate complications. Estimated blood loss:                            Minimal. Estimated Blood Loss:     Estimated blood loss was minimal. Impression:               - Hemorrhoids found on perianal exam.                           - Three 3 to 5 mm polyps in the transverse colon                            and in the ascending colon, removed with a cold                            biopsy forceps. Resected and retrieved.                           - Internal  hemorrhoids. Recommendation:           - Discharge patient to home (ambulatory).                           - Await pathology results.                           - Repeat colonoscopy for surveillance based on                            pathology results.                           - The findings and recommendations were discussed                            with the patient's family.                           - Patient has a contact number available for  emergencies. The signs and symptoms of potential                            delayed complications were discussed with the                            patient. Return to normal activities tomorrow.                            Written discharge instructions were provided to the                            patient. Inocente Hausen, MD 12/10/2023 8:20:51 AM This report has been signed electronically. Addendum Number: 1   Addendum Date: 12/10/2023 8:22:13 AM      Additional indications: History of colorectal cancer in a first-degree       relative?"mother diagnosed in her 25's; Family history of colon polyps Inocente Hausen, MD 12/10/2023 8:23:20 AM This report has been signed electronically. 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

## 2023-12-11 ENCOUNTER — Telehealth: Payer: Self-pay | Admitting: *Deleted

## 2023-12-11 ENCOUNTER — Inpatient Hospital Stay: Attending: Hematology & Oncology

## 2023-12-11 DIAGNOSIS — C8330 Diffuse large B-cell lymphoma, unspecified site: Secondary | ICD-10-CM | POA: Diagnosis not present

## 2023-12-11 DIAGNOSIS — Z452 Encounter for adjustment and management of vascular access device: Secondary | ICD-10-CM | POA: Diagnosis not present

## 2023-12-11 NOTE — Telephone Encounter (Signed)
  Follow up Call-     12/10/2023    7:18 AM  Call back number  Post procedure Call Back phone  # [phone]  Permission to leave phone message Yes     Patient questions:  Do you have a fever, pain , or abdominal swelling? No. Pain Score  0 *  Have you tolerated food without any problems? Yes.    Have you been able to return to your normal activities? Yes.    Do you have any questions about your discharge instructions: Diet   No. Medications  No. Follow up visit  No.  Do you have questions or concerns about your Care? No.  Actions: * If pain score is 4 or above: No action needed, pain <4.

## 2023-12-12 LAB — SURGICAL PATHOLOGY

## 2023-12-13 ENCOUNTER — Ambulatory Visit: Payer: Self-pay | Admitting: Pediatrics

## 2024-01-24 DIAGNOSIS — M546 Pain in thoracic spine: Secondary | ICD-10-CM | POA: Diagnosis not present

## 2024-01-24 DIAGNOSIS — M9902 Segmental and somatic dysfunction of thoracic region: Secondary | ICD-10-CM | POA: Diagnosis not present

## 2024-01-24 DIAGNOSIS — M542 Cervicalgia: Secondary | ICD-10-CM | POA: Diagnosis not present

## 2024-01-24 DIAGNOSIS — M9901 Segmental and somatic dysfunction of cervical region: Secondary | ICD-10-CM | POA: Diagnosis not present

## 2024-01-24 DIAGNOSIS — M9903 Segmental and somatic dysfunction of lumbar region: Secondary | ICD-10-CM | POA: Diagnosis not present

## 2024-01-24 DIAGNOSIS — M545 Low back pain, unspecified: Secondary | ICD-10-CM | POA: Diagnosis not present

## 2024-02-08 ENCOUNTER — Inpatient Hospital Stay

## 2024-02-08 ENCOUNTER — Inpatient Hospital Stay: Admitting: Hematology & Oncology

## 2024-02-18 DIAGNOSIS — C44629 Squamous cell carcinoma of skin of left upper limb, including shoulder: Secondary | ICD-10-CM | POA: Diagnosis not present

## 2024-02-18 DIAGNOSIS — L929 Granulomatous disorder of the skin and subcutaneous tissue, unspecified: Secondary | ICD-10-CM | POA: Diagnosis not present

## 2024-02-18 DIAGNOSIS — L578 Other skin changes due to chronic exposure to nonionizing radiation: Secondary | ICD-10-CM | POA: Diagnosis not present

## 2024-02-18 DIAGNOSIS — L720 Epidermal cyst: Secondary | ICD-10-CM | POA: Diagnosis not present

## 2024-02-21 ENCOUNTER — Inpatient Hospital Stay

## 2024-02-21 ENCOUNTER — Encounter: Payer: Self-pay | Admitting: Medical Oncology

## 2024-02-21 ENCOUNTER — Inpatient Hospital Stay: Attending: Hematology & Oncology

## 2024-02-21 ENCOUNTER — Inpatient Hospital Stay: Admitting: Medical Oncology

## 2024-02-21 VITALS — BP 124/84 | HR 94 | Temp 97.7°F | Resp 17 | Wt 181.1 lb

## 2024-02-21 VITALS — BP 129/87 | HR 73 | Temp 97.6°F | Resp 18

## 2024-02-21 DIAGNOSIS — C8338 Diffuse large B-cell lymphoma, lymph nodes of multiple sites: Secondary | ICD-10-CM

## 2024-02-21 DIAGNOSIS — C8330 Diffuse large B-cell lymphoma, unspecified site: Secondary | ICD-10-CM | POA: Insufficient documentation

## 2024-02-21 DIAGNOSIS — M25552 Pain in left hip: Secondary | ICD-10-CM

## 2024-02-21 DIAGNOSIS — S2249XS Multiple fractures of ribs, unspecified side, sequela: Secondary | ICD-10-CM

## 2024-02-21 LAB — CBC WITH DIFFERENTIAL (CANCER CENTER ONLY)
Abs Immature Granulocytes: 0.01 K/uL (ref 0.00–0.07)
Basophils Absolute: 0 K/uL (ref 0.0–0.1)
Basophils Relative: 1 %
Eosinophils Absolute: 0.1 K/uL (ref 0.0–0.5)
Eosinophils Relative: 2 %
HCT: 36.4 % — ABNORMAL LOW (ref 39.0–52.0)
Hemoglobin: 12.7 g/dL — ABNORMAL LOW (ref 13.0–17.0)
Immature Granulocytes: 0 %
Lymphocytes Relative: 30 %
Lymphs Abs: 1.6 K/uL (ref 0.7–4.0)
MCH: 32.3 pg (ref 26.0–34.0)
MCHC: 34.9 g/dL (ref 30.0–36.0)
MCV: 92.6 fL (ref 80.0–100.0)
Monocytes Absolute: 0.4 K/uL (ref 0.1–1.0)
Monocytes Relative: 8 %
Neutro Abs: 3.2 K/uL (ref 1.7–7.7)
Neutrophils Relative %: 59 %
Platelet Count: 115 K/uL — ABNORMAL LOW (ref 150–400)
RBC: 3.93 MIL/uL — ABNORMAL LOW (ref 4.22–5.81)
RDW: 13.3 % (ref 11.5–15.5)
WBC Count: 5.4 K/uL (ref 4.0–10.5)
nRBC: 0 % (ref 0.0–0.2)

## 2024-02-21 LAB — CMP (CANCER CENTER ONLY)
ALT: 26 U/L (ref 0–44)
AST: 21 U/L (ref 15–41)
Albumin: 4.4 g/dL (ref 3.5–5.0)
Alkaline Phosphatase: 49 U/L (ref 38–126)
Anion gap: 12 (ref 5–15)
BUN: 22 mg/dL (ref 8–23)
CO2: 23 mmol/L (ref 22–32)
Calcium: 9.4 mg/dL (ref 8.9–10.3)
Chloride: 102 mmol/L (ref 98–111)
Creatinine: 0.88 mg/dL (ref 0.61–1.24)
GFR, Estimated: >60 mL/min (ref >60)
Glucose, Bld: 263 mg/dL — ABNORMAL HIGH (ref 70–99)
Potassium: 4.6 mmol/L (ref 3.5–5.1)
Sodium: 136 mmol/L (ref 135–145)
Total Bilirubin: 0.6 mg/dL (ref 0.0–1.2)
Total Protein: 6.9 g/dL (ref 6.5–8.1)

## 2024-02-21 LAB — LACTATE DEHYDROGENASE: LDH: 124 U/L (ref 98–192)

## 2024-02-21 MED ORDER — ZOLEDRONIC ACID 4 MG/100ML IV SOLN
4.0000 mg | Freq: Once | INTRAVENOUS | Status: AC
  Administered 2024-02-21: 4 mg via INTRAVENOUS
  Filled 2024-02-21: qty 100

## 2024-02-21 MED ORDER — SODIUM CHLORIDE 0.9 % IV SOLN: Freq: Once | INTRAVENOUS | Status: AC

## 2024-02-21 NOTE — Patient Instructions (Signed)

## 2024-02-21 NOTE — Progress Notes (Signed)
 Hematology and Oncology Follow Up Visit  Aaron PATINO Sr. [id number] [birth year] 68 y.o. 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 He is here with his wife.   They report that he has been well since his last visit. They have no concerns or complaints.   Chronic pain is stable to improved.   He did have a cyst of his left elbow removed since his last visit. This healed well.   He has had no problems with nausea or vomiting.  He has had no change in bowel or bladder habits.  Again, his main problem continues to be his high blood sugars which are poorly controlled.  He has had no fever.  He has had no cough or shortness of breath.  His appetite has been quite good.  He has had no leg swelling.  He has had no joint aches or pains.  He has had no headache.  No night sweats, unintentional weight loss, new lumps/bumps  Overall, I would say his performance status is probably ECOG 1.       Wt Readings from Last 3 Encounters:  02/21/24 181 lb 1.3 oz (82.1 kg)  12/10/23 181 lb (82.1 kg)  11/30/23 181 lb 4 oz (82.2 kg)    Medications:  Current Outpatient Medications:    albuterol  (PROAIR  HFA) 108 (90 Base) MCG/ACT inhaler, Inhale 1-2 puffs into the lungs every 6 (six) hours as needed for wheezing or shortness of breath., Disp: 8 g, Rfl: 6   aspirin  EC 81 MG EC tablet, Take 1 tablet (81 mg total) by mouth daily at 6 (six) AM. Swallow whole. (Patient taking differently: Take 81 mg by mouth in the morning. Swallow whole.), Disp: 30 tablet, Rfl: 11   atenolol  (TENORMIN ) 25 MG tablet, Take 25 mg by mouth daily., Disp: , Rfl:    blood glucose meter kit and supplies KIT, Dispense based on patient and insurance preference.  Use up to four times daily as directed., Disp: 1 each, Rfl: 0   calcium  carbonate (OSCAL) 1500 (600 Ca) MG TABS tablet, Take 600 mg of elemental calcium  by mouth daily with breakfast., Disp: , Rfl:    Continuous Glucose Sensor (DEXCOM G7 SENSOR) MISC, , Disp: , Rfl:    diclofenac  Sodium (VOLTAREN ) 1 % GEL, Apply 2 g topically daily as needed (to painful sites)., Disp: , Rfl:    DULoxetine  (CYMBALTA ) 60 MG capsule, Take 60 mg by mouth at bedtime., Disp: , Rfl:    Fish Oil OIL, Take 1,500 mg by mouth daily., Disp: , Rfl:    glipiZIDE (GLUCOTROL XL) 5 MG 24 hr tablet, Take 5 mg by mouth daily., Disp: , Rfl:    HYDROcodone -acetaminophen  (NORCO) 5-325 MG tablet, Take 1 tablet by mouth every 4 (four) hours as needed for severe pain., Disp: 15 tablet, Rfl: 0   insulin  glargine (LANTUS ) 100 UNIT/ML Solostar Pen, Inject 25 Units into the skin daily. (Patient taking differently: Inject 20 Units into the skin at bedtime.), Disp: 15 mL, Rfl: 11   Insulin  Pen Needle 32G X 4 MM MISC, 1 each by Does not apply route 4 (four) times daily - after meals and at bedtime., Disp:  100 each, Rfl: 5   lisinopril (ZESTRIL) 5 MG tablet, Take 5 mg by mouth daily., Disp: , Rfl:    metFORMIN  (GLUCOPHAGE -XR) 500 MG 24 hr tablet, Take 2,000 mg by mouth daily with breakfast., Disp: , Rfl:    Multiple Vitamin (MULTIVITAMIN ADULT PO), Take 1 tablet by mouth daily., Disp: , Rfl:    mupirocin  ointment (BACTROBAN ) 2 %, Apply topically at bedtime., Disp: , Rfl:    omeprazole (PRILOSEC) 40 MG capsule, Take 40 mg by mouth every evening., Disp: , Rfl:    potassium chloride  SA (KLOR-CON  M20) 20 MEQ tablet, TAKE 1 TABLET (20 MEQ TOTAL) BY MOUTH DAILY.  (Patient taking differently: Take 20 mEq by mouth in the morning.), Disp: 30 tablet, Rfl: 5   rosuvastatin  (CRESTOR ) 10 MG tablet, Take 10 mg by mouth daily., Disp: , Rfl:    tadalafil  (CIALIS ) 5 MG tablet, Take 5 mg by mouth in the morning., Disp: , Rfl:    tamsulosin  (FLOMAX ) 0.4 MG CAPS  capsule, Take 0.4 mg by mouth every evening., Disp: , Rfl:    tiZANidine (ZANAFLEX) 4 MG tablet, Take 4 mg by mouth 2 (two) times daily as needed., Disp: , Rfl:    traMADol  (ULTRAM ) 50 MG tablet, Take 50 mg by mouth 3 (three) times daily as needed., Disp: , Rfl:    Zinc 50 MG TABS, Take 50 mg by mouth daily., Disp: , Rfl:   Allergies:  Allergies  Allergen Reactions   Fexofenadine Other (See Comments)    dries me out too much   Pravastatin  Sodium Other (See Comments)    Aches   Requip  [Ropinirole  Hcl] Other (See Comments)    Bad dreams   Hydrochlorothiazide Other (See Comments)    Cramps    Past Medical History, Surgical history, Social history, and Family History were reviewed and updated.  Review of Systems: Review of Systems  Constitutional: Negative.   HENT:  Negative.    Eyes: Negative.   Respiratory: Negative.    Cardiovascular: Negative.   Gastrointestinal: Negative.   Endocrine: Negative.   Genitourinary: Negative.    Musculoskeletal: Negative.   Skin: Negative.   Neurological: Negative.   Hematological: Negative.   Psychiatric/Behavioral: Negative.      Physical Exam:  weight is 181 lb 1.3 oz (82.1 kg). His oral temperature is 97.7 F (36.5 C). His blood pressure is 124/84 and his pulse is 94. His respiration is 17 and oxygen saturation is 97%.   Wt Readings from Last 3 Encounters:  02/21/24 181 lb 1.3 oz (82.1 kg)  12/10/23 181 lb (82.1 kg)  11/30/23 181 lb 4 oz (82.2 kg)    Physical Exam Vitals reviewed.  HENT:     Head: Normocephalic and atraumatic.  Eyes:     Pupils: Pupils are equal, round, and reactive to light.  Cardiovascular:     Rate and Rhythm: Normal rate and regular rhythm.     Heart sounds: Normal heart sounds.  Pulmonary:     Effort: Pulmonary effort is normal.     Breath sounds: Normal breath sounds.  Abdominal:     General: Bowel sounds are normal.     Palpations: Abdomen is soft.  Musculoskeletal:        General: No tenderness  or deformity. Normal range of motion.     Cervical back: Normal range of motion.  Lymphadenopathy:     Cervical: No cervical adenopathy.  Skin:    General: Skin is warm and dry.     Findings:  No erythema or rash.  Neurological:     Mental Status: He is alert and oriented to person, place, and time.  Psychiatric:        Behavior: Behavior normal.        Thought Content: Thought content normal.        Judgment: Judgment normal.      Lab Results  Component Value Date   WBC 5.4 02/21/2024   HGB 12.7 (L) 02/21/2024   HCT 36.4 (L) 02/21/2024   MCV 92.6 02/21/2024   PLT PENDING 02/21/2024     Chemistry      Component Value Date/Time   NA 135 08/10/2023 0917   K 4.8 08/10/2023 0917   CL 102 08/10/2023 0917   CO2 25 08/10/2023 0917   BUN 23 08/10/2023 0917   CREATININE 0.96 08/10/2023 0917      Component Value Date/Time   CALCIUM  9.0 08/10/2023 0917   ALKPHOS 44 08/10/2023 0917   AST 18 08/10/2023 0917   ALT 25 08/10/2023 0917   BILITOT 0.7 08/10/2023 0917     No diagnosis found.  Impression and Plan: Aaron Weaver is a very nice 68 year old white male.  He has diffuse large cell non-Hodgkin's lymphoma.  He has extra nodal disease.  Again, he has a fairly high IPI score.  He has had 8 cycles of chemotherapy with R-CHOP.    He gets PET scans every 6 months. I will place an order for repeat scans today.  CBC shows a stable Hgb and WBC however his platelets are down a bit. His platelet count has waxed and waned over the past year. We will continue to monitor at this time. Reviewed bleeding precautions.   CMP reviewed- He will get his Zometa  today.  Disposition: Zometa  today RTC every 8 weeks for port flush RTC 6 months MD, port labs, Zometa  infusion   Lauraine CHRISTELLA Dais, PA-C 10/16/20259:03 AM

## 2024-02-21 NOTE — Patient Instructions (Signed)

## 2024-02-22 DIAGNOSIS — C8330 Diffuse large B-cell lymphoma, unspecified site: Secondary | ICD-10-CM | POA: Diagnosis not present

## 2024-02-22 DIAGNOSIS — J439 Emphysema, unspecified: Secondary | ICD-10-CM | POA: Diagnosis not present

## 2024-02-22 DIAGNOSIS — E1169 Type 2 diabetes mellitus with other specified complication: Secondary | ICD-10-CM | POA: Diagnosis not present

## 2024-02-22 DIAGNOSIS — Z23 Encounter for immunization: Secondary | ICD-10-CM | POA: Diagnosis not present

## 2024-02-22 DIAGNOSIS — E7800 Pure hypercholesterolemia, unspecified: Secondary | ICD-10-CM | POA: Diagnosis not present

## 2024-02-22 DIAGNOSIS — I719 Aortic aneurysm of unspecified site, without rupture: Secondary | ICD-10-CM | POA: Diagnosis not present

## 2024-02-29 ENCOUNTER — Encounter (HOSPITAL_COMMUNITY)
Admission: RE | Admit: 2024-02-29 | Discharge: 2024-02-29 | Disposition: A | Discharge status: Home / Self Care | Source: Ambulatory Visit | Attending: Medical Oncology | Admitting: Medical Oncology

## 2024-02-29 DIAGNOSIS — C8338 Diffuse large B-cell lymphoma, lymph nodes of multiple sites: Secondary | ICD-10-CM | POA: Insufficient documentation

## 2024-02-29 DIAGNOSIS — C8590 Non-Hodgkin lymphoma, unspecified, unspecified site: Secondary | ICD-10-CM | POA: Diagnosis not present

## 2024-02-29 LAB — GLUCOSE, CAPILLARY: Glucose-Capillary: 175 mg/dL — ABNORMAL HIGH (ref 70–99)

## 2024-02-29 MED ORDER — FLUDEOXYGLUCOSE F - 18 (FDG) INJECTION: 8.9000 | Freq: Once | INTRAVENOUS | Status: DC | PRN

## 2024-03-03 ENCOUNTER — Ambulatory Visit: Payer: Self-pay | Admitting: Hematology & Oncology

## 2024-04-22 ENCOUNTER — Inpatient Hospital Stay: Attending: Hematology & Oncology

## 2024-04-22 DIAGNOSIS — Z452 Encounter for adjustment and management of vascular access device: Secondary | ICD-10-CM | POA: Insufficient documentation

## 2024-04-22 DIAGNOSIS — C8330 Diffuse large B-cell lymphoma, unspecified site: Secondary | ICD-10-CM | POA: Insufficient documentation

## 2024-04-22 NOTE — Patient Instructions (Signed)

## 2024-06-23 ENCOUNTER — Inpatient Hospital Stay: Attending: Hematology & Oncology

## 2024-08-21 ENCOUNTER — Inpatient Hospital Stay

## 2024-08-21 ENCOUNTER — Inpatient Hospital Stay: Attending: Hematology & Oncology

## 2024-08-21 ENCOUNTER — Inpatient Hospital Stay: Admitting: Hematology & Oncology
